# Patient Record
Sex: Female | Born: 1940
Health system: Southern US, Community
[De-identification: ages and names within clinical notes are randomized; demographics above are authoritative.]

## PROBLEM LIST (undated history)

## (undated) DIAGNOSIS — Z87898 Personal history of other specified conditions: Secondary | ICD-10-CM

## (undated) DIAGNOSIS — I6930 Unspecified sequelae of cerebral infarction: Secondary | ICD-10-CM

## (undated) DIAGNOSIS — R269 Unspecified abnormalities of gait and mobility: Secondary | ICD-10-CM

## (undated) DIAGNOSIS — I517 Cardiomegaly: Secondary | ICD-10-CM

## (undated) DIAGNOSIS — I2510 Atherosclerotic heart disease of native coronary artery without angina pectoris: Secondary | ICD-10-CM

## (undated) DIAGNOSIS — R262 Difficulty in walking, not elsewhere classified: Secondary | ICD-10-CM

## (undated) DIAGNOSIS — R7303 Prediabetes: Secondary | ICD-10-CM

## (undated) DIAGNOSIS — E785 Hyperlipidemia, unspecified: Secondary | ICD-10-CM

## (undated) DIAGNOSIS — I6523 Occlusion and stenosis of bilateral carotid arteries: Secondary | ICD-10-CM

## (undated) DIAGNOSIS — I471 Supraventricular tachycardia: Secondary | ICD-10-CM

## (undated) DIAGNOSIS — I10 Essential (primary) hypertension: Secondary | ICD-10-CM

## (undated) DIAGNOSIS — E039 Hypothyroidism, unspecified: Secondary | ICD-10-CM

## (undated) DIAGNOSIS — R2990 Unspecified symptoms and signs involving the nervous system: Secondary | ICD-10-CM

## (undated) DIAGNOSIS — F4322 Adjustment disorder with anxiety: Secondary | ICD-10-CM

## (undated) DIAGNOSIS — I340 Nonrheumatic mitral (valve) insufficiency: Secondary | ICD-10-CM

## (undated) DIAGNOSIS — R002 Palpitations: Secondary | ICD-10-CM

## (undated) DIAGNOSIS — I350 Nonrheumatic aortic (valve) stenosis: Secondary | ICD-10-CM

## (undated) DIAGNOSIS — G214 Vascular parkinsonism: Secondary | ICD-10-CM

## (undated) DIAGNOSIS — I639 Cerebral infarction, unspecified: Secondary | ICD-10-CM

## (undated) DIAGNOSIS — G8194 Hemiplegia, unspecified affecting left nondominant side: Secondary | ICD-10-CM

## (undated) DIAGNOSIS — Z9289 Personal history of other medical treatment: Secondary | ICD-10-CM

## (undated) HISTORY — DX: Adjustment disorder with anxiety: F43.22

## (undated) HISTORY — DX: Hyperlipidemia, unspecified: E78.5

## (undated) HISTORY — DX: Palpitations: R00.2

## (undated) HISTORY — DX: Prediabetes: R73.03

## (undated) HISTORY — DX: Unspecified symptoms and signs involving the nervous system: R29.90

## (undated) HISTORY — DX: Personal history of other medical treatment: Z92.89

## (undated) HISTORY — DX: Difficulty in walking, not elsewhere classified: R26.2

## (undated) HISTORY — DX: Cardiomegaly: I51.7

## (undated) HISTORY — DX: Unspecified sequelae of cerebral infarction: I69.30

## (undated) HISTORY — DX: Nonrheumatic aortic (valve) stenosis: I35.0

## (undated) HISTORY — DX: Hypothyroidism, unspecified: E03.9

## (undated) HISTORY — DX: Personal history of other specified conditions: Z87.898

## (undated) HISTORY — DX: Unspecified abnormalities of gait and mobility: R26.9

## (undated) HISTORY — DX: Nonrheumatic mitral (valve) insufficiency: I34.0

## (undated) HISTORY — DX: Hemiplegia, unspecified affecting left nondominant side: G81.94

## (undated) HISTORY — DX: Cerebral infarction, unspecified: I63.9

## (undated) HISTORY — PX: LOOP RECORDER IMPLANT: SHX5954

## (undated) HISTORY — DX: Atherosclerotic heart disease of native coronary artery without angina pectoris: I25.10

## (undated) HISTORY — DX: Essential (primary) hypertension: I10

## (undated) HISTORY — DX: Vascular parkinsonism: G21.4

## (undated) HISTORY — DX: Occlusion and stenosis of bilateral carotid arteries: I65.23

## (undated) HISTORY — DX: Supraventricular tachycardia: I47.1

---

## 1999-07-14 ENCOUNTER — Other Ambulatory Visit: Admission: RE | Admit: 1999-07-14 | Discharge: 1999-07-14 | Payer: Self-pay | Admitting: Gynecology

## 2000-08-03 HISTORY — PX: CARDIOVASCULAR STRESS TEST: SHX262

## 2000-08-17 ENCOUNTER — Other Ambulatory Visit: Admission: RE | Admit: 2000-08-17 | Discharge: 2000-08-17 | Payer: Self-pay | Admitting: Gynecology

## 2001-10-04 ENCOUNTER — Other Ambulatory Visit: Admission: RE | Admit: 2001-10-04 | Discharge: 2001-10-04 | Payer: Self-pay | Admitting: Gynecology

## 2002-10-24 ENCOUNTER — Other Ambulatory Visit: Admission: RE | Admit: 2002-10-24 | Discharge: 2002-10-24 | Payer: Self-pay | Admitting: Gynecology

## 2004-11-25 ENCOUNTER — Other Ambulatory Visit: Admission: RE | Admit: 2004-11-25 | Discharge: 2004-11-25 | Payer: Self-pay | Admitting: Gynecology

## 2006-08-03 HISTORY — PX: TRANSTHORACIC ECHOCARDIOGRAM: SHX275

## 2007-03-29 ENCOUNTER — Other Ambulatory Visit: Admission: RE | Admit: 2007-03-29 | Discharge: 2007-03-29 | Payer: Self-pay | Admitting: Gynecology

## 2008-05-02 ENCOUNTER — Encounter: Admission: RE | Admit: 2008-05-02 | Discharge: 2008-05-02 | Payer: Self-pay | Admitting: Internal Medicine

## 2008-10-01 ENCOUNTER — Encounter: Admission: RE | Admit: 2008-10-01 | Discharge: 2008-10-01 | Payer: Self-pay | Admitting: Endocrinology

## 2009-04-03 ENCOUNTER — Encounter: Admission: RE | Admit: 2009-04-03 | Discharge: 2009-04-03 | Payer: Self-pay | Admitting: Endocrinology

## 2010-04-11 ENCOUNTER — Encounter: Admission: RE | Admit: 2010-04-11 | Discharge: 2010-04-11 | Payer: Self-pay | Admitting: Endocrinology

## 2010-04-30 ENCOUNTER — Ambulatory Visit: Payer: Self-pay | Admitting: Cardiology

## 2010-05-06 ENCOUNTER — Ambulatory Visit: Payer: Self-pay | Admitting: Cardiology

## 2010-10-08 ENCOUNTER — Ambulatory Visit: Payer: Medicare Other | Attending: Internal Medicine | Admitting: Physical Therapy

## 2010-10-08 DIAGNOSIS — R42 Dizziness and giddiness: Secondary | ICD-10-CM | POA: Insufficient documentation

## 2010-10-08 DIAGNOSIS — IMO0001 Reserved for inherently not codable concepts without codable children: Secondary | ICD-10-CM | POA: Insufficient documentation

## 2010-10-13 ENCOUNTER — Ambulatory Visit: Payer: Medicare Other | Admitting: Rehabilitative and Restorative Service Providers"

## 2010-10-14 ENCOUNTER — Encounter: Payer: Medicare Other | Admitting: Rehabilitative and Restorative Service Providers"

## 2010-10-20 ENCOUNTER — Ambulatory Visit: Payer: Medicare Other | Admitting: Cardiology

## 2010-10-28 ENCOUNTER — Encounter: Payer: Medicare Other | Admitting: Physical Therapy

## 2010-10-30 ENCOUNTER — Encounter: Payer: Medicare Other | Admitting: Physical Therapy

## 2010-11-04 ENCOUNTER — Encounter: Payer: Medicare Other | Admitting: Physical Therapy

## 2010-11-06 ENCOUNTER — Encounter: Payer: Medicare Other | Admitting: Physical Therapy

## 2010-11-11 ENCOUNTER — Encounter: Payer: Medicare Other | Admitting: Physical Therapy

## 2010-11-13 ENCOUNTER — Encounter: Payer: Medicare Other | Admitting: Physical Therapy

## 2010-12-01 ENCOUNTER — Other Ambulatory Visit: Payer: Self-pay | Admitting: *Deleted

## 2010-12-01 DIAGNOSIS — Z79899 Other long term (current) drug therapy: Secondary | ICD-10-CM

## 2010-12-01 DIAGNOSIS — E7800 Pure hypercholesterolemia, unspecified: Secondary | ICD-10-CM

## 2010-12-15 ENCOUNTER — Other Ambulatory Visit: Payer: Medicare Other | Admitting: *Deleted

## 2010-12-17 ENCOUNTER — Telehealth: Payer: Self-pay | Admitting: Cardiology

## 2010-12-17 NOTE — Telephone Encounter (Signed)
Has been having dizziness due to ear/sinus problems.  Her ENT (Dr. Haroldine Laws) wanted pt to f/u on her  meds and possible bloodwork.

## 2010-12-17 NOTE — Telephone Encounter (Signed)
Offered appointment with Lawson Fiscal, wanted to see Dr. Patty Sermons gave her next available for 5/24

## 2010-12-18 ENCOUNTER — Ambulatory Visit: Payer: Medicare Other | Admitting: Cardiology

## 2010-12-23 ENCOUNTER — Telehealth: Payer: Self-pay | Admitting: Cardiology

## 2010-12-23 NOTE — Telephone Encounter (Signed)
Agree with plan 

## 2010-12-23 NOTE — Telephone Encounter (Signed)
blood pressure was up to 150/89 and has been off for 3 days.  Advised to take 1/2 tonite and none tomorrow and keep appointment with Dr Patty Sermons on Thursday.  She doesn't think she needs to continue.

## 2010-12-23 NOTE — Telephone Encounter (Signed)
Has an appointment on Thursday w/[facility]. She has been feeling dizzy the past several days and wants to know if she can stop taking her BP med.until she sees the Dr.  She wonders if her dizziness may be from something else.

## 2010-12-24 ENCOUNTER — Encounter: Payer: Self-pay | Admitting: Cardiology

## 2010-12-24 DIAGNOSIS — I350 Nonrheumatic aortic (valve) stenosis: Secondary | ICD-10-CM | POA: Insufficient documentation

## 2010-12-24 DIAGNOSIS — R262 Difficulty in walking, not elsewhere classified: Secondary | ICD-10-CM | POA: Insufficient documentation

## 2010-12-24 DIAGNOSIS — Z87898 Personal history of other specified conditions: Secondary | ICD-10-CM | POA: Insufficient documentation

## 2010-12-24 DIAGNOSIS — I10 Essential (primary) hypertension: Secondary | ICD-10-CM | POA: Insufficient documentation

## 2010-12-24 DIAGNOSIS — E7849 Other hyperlipidemia: Secondary | ICD-10-CM | POA: Insufficient documentation

## 2010-12-24 DIAGNOSIS — E039 Hypothyroidism, unspecified: Secondary | ICD-10-CM | POA: Insufficient documentation

## 2010-12-24 DIAGNOSIS — I517 Cardiomegaly: Secondary | ICD-10-CM | POA: Insufficient documentation

## 2010-12-24 DIAGNOSIS — R0602 Shortness of breath: Secondary | ICD-10-CM | POA: Insufficient documentation

## 2010-12-24 DIAGNOSIS — E559 Vitamin D deficiency, unspecified: Secondary | ICD-10-CM | POA: Insufficient documentation

## 2010-12-24 DIAGNOSIS — I340 Nonrheumatic mitral (valve) insufficiency: Secondary | ICD-10-CM | POA: Insufficient documentation

## 2010-12-24 DIAGNOSIS — R002 Palpitations: Secondary | ICD-10-CM | POA: Insufficient documentation

## 2010-12-25 ENCOUNTER — Encounter: Payer: Self-pay | Admitting: Cardiology

## 2010-12-25 ENCOUNTER — Ambulatory Visit (INDEPENDENT_AMBULATORY_CARE_PROVIDER_SITE_OTHER): Payer: Medicare Other | Admitting: Cardiology

## 2010-12-25 ENCOUNTER — Other Ambulatory Visit (INDEPENDENT_AMBULATORY_CARE_PROVIDER_SITE_OTHER): Payer: Medicare Other | Admitting: *Deleted

## 2010-12-25 ENCOUNTER — Other Ambulatory Visit (INDEPENDENT_AMBULATORY_CARE_PROVIDER_SITE_OTHER): Payer: Medicare Other | Admitting: Cardiology

## 2010-12-25 VITALS — BP 140/70 | HR 84 | Wt 118.0 lb

## 2010-12-25 DIAGNOSIS — E7800 Pure hypercholesterolemia, unspecified: Secondary | ICD-10-CM

## 2010-12-25 DIAGNOSIS — I10 Essential (primary) hypertension: Secondary | ICD-10-CM

## 2010-12-25 DIAGNOSIS — Z79899 Other long term (current) drug therapy: Secondary | ICD-10-CM

## 2010-12-25 DIAGNOSIS — I119 Hypertensive heart disease without heart failure: Secondary | ICD-10-CM

## 2010-12-25 DIAGNOSIS — Z13220 Encounter for screening for lipoid disorders: Secondary | ICD-10-CM

## 2010-12-25 DIAGNOSIS — Z87898 Personal history of other specified conditions: Secondary | ICD-10-CM

## 2010-12-25 LAB — BASIC METABOLIC PANEL
BUN: 12 mg/dL (ref 6–23)
Calcium: 9.7 mg/dL (ref 8.4–10.5)
GFR: 89.35 mL/min (ref >60.00)
Glucose, Bld: 106 mg/dL — ABNORMAL HIGH (ref 70–99)

## 2010-12-25 LAB — LIPID PANEL
Cholesterol: 305 mg/dL — ABNORMAL HIGH (ref 0–200)
HDL: 68.9 mg/dL (ref >39.00)
VLDL: 17.2 mg/dL (ref 0.0–40.0)

## 2010-12-25 LAB — HEPATIC FUNCTION PANEL: Albumin: 4.2 g/dL (ref 3.5–5.2)

## 2010-12-25 MED ORDER — TELMISARTAN 40 MG PO TABS: ORAL_TABLET | ORAL | Status: DC

## 2010-12-25 NOTE — Assessment & Plan Note (Signed)
The patient continues to have a lot of problems with dizziness.  She complains that her equilibrium is off.  She has seen Dr. Haroldine Laws who has evaluated her thoroughly.  He has ordered an MRI of her head which is scheduled for next week.  She was asking me if I thought that she should proceed with that test and I encouraged her to do so.

## 2010-12-25 NOTE — Assessment & Plan Note (Signed)
Patient continues to have evidence of very high cholesterol.  Her total cholesterol is 305 with an LDL of257.  She does not tolerate statin drugs.

## 2010-12-25 NOTE — Assessment & Plan Note (Signed)
The patient has a past history of labileHypertension.  Her blood pressure will be very high one minute and low the next.  She also has a history of frequent palpitations.  Unfortunately she does not tolerate beta blockers.She is presently taking one half of a 40 mg Micardis and on this dose her blood pressure has been in a acceptable range most of the time.  The patient is not having any chest pain to suggest angina.

## 2010-12-25 NOTE — Progress Notes (Signed)
Stacey Ramirez Date of Birth:  1941/01/01 [facility] / [facility] [address]   [address] [place], [place]  [address] [phone]           Fax   [phone]  History of Present Illness: This pleasant 70 year old is seen for a scheduled followup office visit.  She has a complex past medical history.  He has a history of labile hypertension and a history of significant hypercholesterolemia.  She also has a history of hypothyroidism and low vitamin D levels.  She had a stress test in 2000 which was normal.  Her last echocardiogram was on 02/15/2007 and showed left ventricular hypertrophy with mild aortic sclerosis and very mild mitral regurgitation she has a history of hypercholesterolemia but is intolerant of statins and intolerant of other cholesterol medications that we have tried.  She emphasizes that she is very sensitive to most medications.  Recently she has been worked up thoroughly by Dr. Haroldine Laws regarding her symptoms of dizziness.  She is scheduled to have an MRI of the head next week.  Current Outpatient Prescriptions  Medication Sig Dispense Refill  . ALPRAZolam (XANAX) 0.25 MG tablet Take 0.25 mg by mouth at bedtime as needed.        . Cholecalciferol (VITAMIN D PO) Take by mouth daily. Taking occ.      . estradiol (ESTRACE) 0.1 MG/GM vaginal cream Place 2 g vaginally as needed.        Marland Kitchen levothyroxine (SYNTHROID, LEVOTHROID) 50 MCG tablet Take 50 mcg by mouth daily.        . Multiple Vitamin (MULTIVITAMIN) tablet Take 1 tablet by mouth daily. Takes occ.      Marland Kitchen DISCONTD: telmisartan (MICARDIS) 20 MG tablet Take 10 mg by mouth daily.        Marland Kitchen telmisartan (MICARDIS) 40 MG tablet Take one half tablet daily  30 tablet  11  . DISCONTD: BIOTIN PO Take by mouth as directed.          Allergies  Allergen Reactions  . Micardis (Telmisartan)   . Sulfa Drugs Cross Reactors   . Zocor (Simvastatin)     Patient Active Problem List  Diagnoses  . Hypertension  .  Hypercholesterolemia  . Hypothyroidism  . Vitamin D deficiency  . Palpitations  . History of dizziness  . LVH (left ventricular hypertrophy)  . Aortic stenosis  . Mitral regurgitation  . SOB (shortness of breath)  . Difficulty walking    History  Smoking status  . Never Smoker   Smokeless tobacco  . Not on file    History  Alcohol Use No    Family History  Problem Relation Age of Onset  . Heart attack Mother   . Alzheimer's disease Mother     Review of Systems: Constitutional: no fever chills diaphoresis or fatigue or change in weight.  Head and neck: no hearing loss, no epistaxis, no photophobia or visual disturbance. Respiratory: No cough, shortness of breath or wheezing. Cardiovascular: No chest pain peripheral edema, palpitations. Gastrointestinal: No abdominal distention, no abdominal pain, no change in bowel habits hematochezia or melena. Genitourinary: No dysuria, no frequency, no urgency, no nocturia. Musculoskeletal:No arthralgias, no back pain, no gait disturbance or myalgias. Neurological: No dizziness, no headaches, no numbness, no seizures, no syncope, no weakness, no tremors. Hematologic: No lymphadenopathy, no easy bruising. Psychiatric: No confusion, no hallucinations, She has had a difficult time falling asleep and staying asleep.    Physical Exam: Filed Vitals:   12/25/10  1459  BP: 140/70  Pulse: 84  The general appearance is that of an anxious middle-aged woman in no acute distress.  She is not ataxic when she walks.Pupils equal and reactive.   Extraocular Movements are full.  There is no scleral icterus.  The mouth and pharynx are normal.  The neck is supple.  The carotids reveal no bruits.  The jugular venous pressure is normal.  The thyroid is not enlarged.  There is no lymphadenopathy.The chest is clear to percussion and auscultation. There are no rales or rhonchi. Expansion of the chest is symmetrical.  The heart reveals a soft systolic ejection  murmur at the base.  No diastolic murmur and no gallop.The abdomen is soft and nontender. Bowel sounds are normal. The liver and spleen are not enlarged. There Are no abdominal masses. There are no bruits.The pedal pulses are good.  There is no phlebitis or edema.  There is no cyanosis or clubbing.Strength is normal and symmetrical in all extremities.  There is no lateralizing weakness.  There are no sensory deficits.The skin is warm and dry.  There is no rash.   Assessment / Plan: Continue present medication.  Try to do the best she can with diet since she does not tolerate cholesterol-lowering medication.  Continue on salt restricted diet for her dizziness and her blood pressure.  Recheck in 6 months for followup office visit and lab work

## 2010-12-26 ENCOUNTER — Telehealth: Payer: Self-pay | Admitting: Cardiology

## 2010-12-26 NOTE — Telephone Encounter (Signed)
Concerned didn't receive all labs, went over and she did

## 2010-12-26 NOTE — Telephone Encounter (Signed)
Pt didn't get all lab results yesterday please call

## 2010-12-31 ENCOUNTER — Ambulatory Visit: Payer: Medicare Other | Admitting: Cardiology

## 2011-01-05 ENCOUNTER — Telehealth: Payer: Self-pay | Admitting: Cardiology

## 2011-01-05 NOTE — Telephone Encounter (Signed)
Has a question about the MRI that was done.  She also wanted to know what she should do about her lower than normal BP readings.

## 2011-01-06 ENCOUNTER — Telehealth: Payer: Self-pay | Admitting: Cardiology

## 2011-01-06 DIAGNOSIS — I10 Essential (primary) hypertension: Secondary | ICD-10-CM

## 2011-01-06 MED ORDER — TELMISARTAN 20 MG PO TABS: ORAL_TABLET | ORAL | Status: DC

## 2011-01-06 NOTE — Telephone Encounter (Signed)
Spoke with patient today regarding low blood pressure at times will discuss with Dr. Patty Sermons.  Will document on 6/5 telephone encounter

## 2011-01-06 NOTE — Telephone Encounter (Signed)
Pt calling back again was concerned about MRI and the meds making her woozy she said please call

## 2011-01-06 NOTE — Telephone Encounter (Signed)
Patient has been taking micardis 40 mg 1/2 daily. States when her blood pressure goes low she decreases to 1/4 of tablet and seems to be enough.  Will change her micardis to 20 mg 1 daily or 1/2 if blood pressure low.  Continues to be "whoozy" and wanted to know if thyroid meds could be causing, advised it should not be.  Is seeing ENT for dizziness.

## 2011-01-07 NOTE — Telephone Encounter (Signed)
Agree with advice given

## 2011-03-05 ENCOUNTER — Encounter: Payer: Self-pay | Admitting: Cardiology

## 2011-04-13 ENCOUNTER — Other Ambulatory Visit: Payer: Self-pay | Admitting: Endocrinology

## 2011-04-13 DIAGNOSIS — E049 Nontoxic goiter, unspecified: Secondary | ICD-10-CM

## 2011-04-14 ENCOUNTER — Other Ambulatory Visit: Payer: Self-pay | Admitting: Endocrinology

## 2011-04-14 DIAGNOSIS — E049 Nontoxic goiter, unspecified: Secondary | ICD-10-CM

## 2011-06-03 ENCOUNTER — Telehealth: Payer: Self-pay | Admitting: Cardiology

## 2011-06-03 DIAGNOSIS — I119 Hypertensive heart disease without heart failure: Secondary | ICD-10-CM

## 2011-06-03 DIAGNOSIS — E039 Hypothyroidism, unspecified: Secondary | ICD-10-CM

## 2011-06-03 NOTE — Telephone Encounter (Signed)
Pt wants to go over some things prior to getting lab work done tomorrow wants to talk to you today

## 2011-06-03 NOTE — Telephone Encounter (Signed)
Called and put in lab orders.

## 2011-06-04 ENCOUNTER — Ambulatory Visit (INDEPENDENT_AMBULATORY_CARE_PROVIDER_SITE_OTHER): Payer: Medicare Other | Admitting: *Deleted

## 2011-06-04 DIAGNOSIS — I10 Essential (primary) hypertension: Secondary | ICD-10-CM

## 2011-06-04 DIAGNOSIS — I517 Cardiomegaly: Secondary | ICD-10-CM

## 2011-06-04 DIAGNOSIS — E039 Hypothyroidism, unspecified: Secondary | ICD-10-CM

## 2011-06-04 DIAGNOSIS — E7800 Pure hypercholesterolemia, unspecified: Secondary | ICD-10-CM

## 2011-06-04 DIAGNOSIS — I119 Hypertensive heart disease without heart failure: Secondary | ICD-10-CM

## 2011-06-04 DIAGNOSIS — E559 Vitamin D deficiency, unspecified: Secondary | ICD-10-CM

## 2011-06-04 LAB — BASIC METABOLIC PANEL
BUN: 10 mg/dL (ref 6–23)
CO2: 27 mEq/L (ref 19–32)
Chloride: 106 mEq/L (ref 96–112)
Creatinine, Ser: 0.7 mg/dL (ref 0.4–1.2)

## 2011-06-04 LAB — LIPID PANEL
Total CHOL/HDL Ratio: 4
VLDL: 13.6 mg/dL (ref 0.0–40.0)

## 2011-06-04 LAB — HEPATIC FUNCTION PANEL
Albumin: 4.3 g/dL (ref 3.5–5.2)
Alkaline Phosphatase: 58 U/L (ref 39–117)
Bilirubin, Direct: 0.1 mg/dL (ref 0.0–0.3)

## 2011-06-05 LAB — VITAMIN D 25 HYDROXY (VIT D DEFICIENCY, FRACTURES): Vit D, 25-Hydroxy: 39 ng/mL (ref 30–89)

## 2011-06-08 ENCOUNTER — Ambulatory Visit (INDEPENDENT_AMBULATORY_CARE_PROVIDER_SITE_OTHER): Payer: Medicare Other | Admitting: Cardiology

## 2011-06-08 ENCOUNTER — Encounter: Payer: Self-pay | Admitting: Cardiology

## 2011-06-08 VITALS — BP 120/78 | HR 80 | Ht 62.0 in | Wt 119.0 lb

## 2011-06-08 DIAGNOSIS — E7800 Pure hypercholesterolemia, unspecified: Secondary | ICD-10-CM

## 2011-06-08 DIAGNOSIS — I10 Essential (primary) hypertension: Secondary | ICD-10-CM

## 2011-06-08 DIAGNOSIS — R002 Palpitations: Secondary | ICD-10-CM

## 2011-06-08 DIAGNOSIS — R42 Dizziness and giddiness: Secondary | ICD-10-CM

## 2011-06-08 DIAGNOSIS — E039 Hypothyroidism, unspecified: Secondary | ICD-10-CM

## 2011-06-08 DIAGNOSIS — I119 Hypertensive heart disease without heart failure: Secondary | ICD-10-CM

## 2011-06-08 NOTE — Patient Instructions (Addendum)
Your physician wants you to follow-up in: 6 months with fasting labs ahead of time You will receive a reminder letter in the mail two months in advance. If you don't receive a letter, please call our office to schedule the follow-up appointment.  Your physician recommends that you continue on your current medications as directed. Please refer to the Current Medication list given to you today.

## 2011-06-08 NOTE — Progress Notes (Signed)
Stacey Ramirez Date of Birth:  Jul 02, 1941 [facility] / [facility] [address]   [address] [place], [place]  [address] [phone]           Fax   [phone]  History of Present Illness: This pleasant elderly woman is seen for a scheduled followup office visit.  Has a past history of high blood pressure and hypercholesterolemia.  She also has a history of hypothyroidism.  She does not have any history of ischemic heart disease.  She had a stress test in 2000 which was normal.  Her last echocardiogram in July 2008 showed LVH with mild aortic sclerosis and very mild mitral regurgitation.  The patient has a history of significant hypercholesterolemia but is intolerant of statin is having problems with dizziness and had an MRI of her head and May 2012 which showed no evidence of a acoustic neuroma.  Current Outpatient Prescriptions  Medication Sig Dispense Refill  . ALPRAZolam (XANAX) 0.25 MG tablet Take 0.25 mg by mouth at bedtime as needed.        . Cholecalciferol (VITAMIN D PO) Take by mouth daily. Taking occ.      . estradiol (ESTRACE) 0.1 MG/GM vaginal cream Place 2 g vaginally as needed.        Marland Kitchen levothyroxine (SYNTHROID, LEVOTHROID) 50 MCG tablet Take 50 mcg by mouth daily.        . Multiple Vitamin (MULTIVITAMIN) tablet Take 1 tablet by mouth daily. Takes occ.      . telmisartan (MICARDIS) 20 MG tablet Taking 1/2 daily       . trimethoprim (TRIMPEX) 100 MG tablet Take 100 mg by mouth as directed.      Marland Kitchen DISCONTD: telmisartan (MICARDIS) 20 MG tablet Daily or as directed  90 tablet  3    Allergies  Allergen Reactions  . Micardis (Telmisartan)   . Sulfa Drugs Cross Reactors   . Zocor (Simvastatin)     Patient Active Problem List  Diagnoses  . Hypertension  . Hypercholesterolemia  . Hypothyroidism  . Vitamin D deficiency  . Palpitations  . History of dizziness  . LVH (left ventricular hypertrophy)  . Aortic stenosis  . Mitral regurgitation  . SOB (shortness  of breath)  . Difficulty walking    History  Smoking status  . Never Smoker   Smokeless tobacco  . Not on file    History  Alcohol Use No    Family History  Problem Relation Age of Onset  . Heart attack Mother   . Alzheimer's disease Mother     Review of Systems: Constitutional: no fever chills diaphoresis or fatigue or change in weight.  Head and neck: no hearing loss, no epistaxis, no photophobia or visual disturbance. Respiratory: No cough, shortness of breath or wheezing. Cardiovascular: No chest pain peripheral edema, palpitations. Gastrointestinal: No abdominal distention, no abdominal pain, no change in bowel habits hematochezia or melena. Genitourinary: No dysuria, no frequency, no urgency, no nocturia. Musculoskeletal:No arthralgias, no back pain, no gait disturbance or myalgias. Neurological: No dizziness, no headaches, no numbness, no seizures, no syncope, no weakness, no tremors. Hematologic: No lymphadenopathy, no easy bruising. Psychiatric: No confusion, no hallucinations, no sleep disturbance.    Physical Exam: Filed Vitals:   06/08/11 1144  BP: 120/78  Pulse: 80   Gen. appearance reveals a well-developed anxious woman in no acute distress.Pupils equal and reactive.   Extraocular Movements are full.  There is no scleral icterus.  The mouth and pharynx are normal.  The neck is supple.  The carotids reveal no bruits.  The jugular venous pressure is normal.  The thyroid is not enlarged.  There is no lymphadenopathy.  The chest is clear to percussion and auscultation. There are no rales or rhonchi. Expansion of the chest is symmetrical.  The heart reveals a soft systolic ejection murmur at the base.  No gallop click or rub.  The abdomen is soft and nontender. Bowel sounds are normal. The liver and spleen are not enlarged. There Are no abdominal masses. There are no bruits.  The pedal pulses are good.  There is no phlebitis or edema.  There is no cyanosis or  clubbing. Strength is normal and symmetrical in all extremities.  There is no lateralizing weakness.  There are no sensory deficits.  The skin is warm and dry.  There is no rash.   Assessment / Plan: Return in March for a followup office visit.  She will get fasting lab work ahead of time.

## 2011-06-08 NOTE — Assessment & Plan Note (Signed)
Patient has a history of labile hypertension.  Today in the office her blood pressure is good she does have occasional dizzy spells associated with fluctuation of her blood pressure.  She has had a full workup by Dr. Haroldine Laws with no significant underlying pathology found.

## 2011-06-08 NOTE — Assessment & Plan Note (Signed)
The patient has a history of significant hypercholesterolemia.  She is unable to take statins.  Her lipids this time have improved since last time.  She is taking some over-the-counter remedies.

## 2011-06-08 NOTE — Assessment & Plan Note (Signed)
The patient has a long history of palpitations.  He has not been experiencing any documented atrial fibrillation.  She does not have any history of ischemic heart disease and she did have a normal nuclear stress test in 2002.

## 2011-10-26 ENCOUNTER — Telehealth: Payer: Self-pay | Admitting: Cardiology

## 2011-10-26 NOTE — Telephone Encounter (Signed)
Discussed upcoming labs with patient

## 2011-10-26 NOTE — Telephone Encounter (Signed)
Please return call to patient at hm#  [phone], she has questions about labs

## 2011-12-10 ENCOUNTER — Other Ambulatory Visit: Payer: Medicare Other

## 2011-12-14 ENCOUNTER — Ambulatory Visit (INDEPENDENT_AMBULATORY_CARE_PROVIDER_SITE_OTHER): Payer: Medicare Other | Admitting: *Deleted

## 2011-12-14 DIAGNOSIS — E7800 Pure hypercholesterolemia, unspecified: Secondary | ICD-10-CM

## 2011-12-14 DIAGNOSIS — E079 Disorder of thyroid, unspecified: Secondary | ICD-10-CM

## 2011-12-14 LAB — LIPID PANEL
Cholesterol: 298 mg/dL — ABNORMAL HIGH (ref 0–200)
HDL: 67.7 mg/dL (ref >39.00)
Triglycerides: 70 mg/dL (ref 0.0–149.0)
VLDL: 14 mg/dL (ref 0.0–40.0)

## 2011-12-14 LAB — BASIC METABOLIC PANEL
CO2: 26 mEq/L (ref 19–32)
Calcium: 9 mg/dL (ref 8.4–10.5)
Creatinine, Ser: 0.6 mg/dL (ref 0.4–1.2)
GFR: 97.18 mL/min (ref >60.00)
Glucose, Bld: 92 mg/dL (ref 70–99)
Sodium: 140 mEq/L (ref 135–145)

## 2011-12-14 LAB — HEPATIC FUNCTION PANEL
Albumin: 4.3 g/dL (ref 3.5–5.2)
Total Protein: 7.9 g/dL (ref 6.0–8.3)

## 2011-12-14 NOTE — Progress Notes (Signed)
Quick Note:  Please make copy of labs for patient visit. ______ 

## 2011-12-15 ENCOUNTER — Ambulatory Visit (INDEPENDENT_AMBULATORY_CARE_PROVIDER_SITE_OTHER): Payer: Medicare Other | Admitting: Cardiology

## 2011-12-15 ENCOUNTER — Encounter: Payer: Self-pay | Admitting: Cardiology

## 2011-12-15 VITALS — BP 160/82 | HR 80 | Ht 62.0 in | Wt 119.0 lb

## 2011-12-15 DIAGNOSIS — K649 Unspecified hemorrhoids: Secondary | ICD-10-CM

## 2011-12-15 DIAGNOSIS — R002 Palpitations: Secondary | ICD-10-CM

## 2011-12-15 DIAGNOSIS — I10 Essential (primary) hypertension: Secondary | ICD-10-CM

## 2011-12-15 DIAGNOSIS — E7800 Pure hypercholesterolemia, unspecified: Secondary | ICD-10-CM

## 2011-12-15 DIAGNOSIS — I119 Hypertensive heart disease without heart failure: Secondary | ICD-10-CM

## 2011-12-15 MED ORDER — DILTIAZEM GEL 2 %: 1.0000 "application " | Freq: Two times a day (BID) | CUTANEOUS | Status: DC

## 2011-12-15 MED ORDER — LOSARTAN POTASSIUM 50 MG PO TABS: 50.0000 mg | ORAL_TABLET | Freq: Every day | ORAL | Status: DC

## 2011-12-15 NOTE — Patient Instructions (Signed)
Stop Micardis and start Losartan 50 mg daily, Rx sent to [facility] in [place]  Your physician wants you to follow-up in: 6 months You will receive a reminder letter in the mail two months in advance. If you don't receive a letter, please call our office to schedule the follow-up appointment.

## 2011-12-15 NOTE — Assessment & Plan Note (Signed)
Patient has a past history of tachycardia palpitations exacerbated by emotional stress.  We also made note that her serum potassium this time is borderline low and she will try to increase high potassium foods and this may help her palpitations as well

## 2011-12-15 NOTE — Assessment & Plan Note (Signed)
Her blood pressure today is very labile.  She does have extreme whitecoat syndrome.  Her initial blood pressure was greater than 200.  Subsequently in the exam with relaxation systolic comes down to 160.  She states that at home her systolic pressures are in the normal range.  The patient has been on Micardis but it has become expelled 4 and we will switch her to losartan 50 mg one daily for reasons of economics.

## 2011-12-15 NOTE — Assessment & Plan Note (Signed)
The patient has hypercholesterolemia.  She is intolerant of statins her serum cholesterol remains high and she will continue to try to control with diet and exercise.

## 2011-12-15 NOTE — Progress Notes (Signed)
Stacey Ramirez Date of Birth:  10-10-1940 [facility] [address] [place], [place]  [address] [phone]         Fax   [phone]  History of Present Illness: This pleasant 71 year old woman is seen for a scheduled followup office visit.  She has a past history of hyperkinetic heart syndrome with palpitations and labile hypertension.  She also has a history of hypercholesterolemia and a history of hypothyroidism.  She also has a history of GI difficulties with hemorrhoids.  Overall since last visit she has been doing well  Current Outpatient Prescriptions  Medication Sig Dispense Refill  . ALPRAZolam (XANAX) 0.25 MG tablet Take 0.25 mg by mouth at bedtime as needed.        . Cholecalciferol (VITAMIN D PO) Take by mouth daily. Taking occ.      . estradiol (ESTRACE) 0.1 MG/GM vaginal cream Place 2 g vaginally as needed.        Marland Kitchen levothyroxine (SYNTHROID, LEVOTHROID) 50 MCG tablet Take 50 mcg by mouth daily.        . Multiple Vitamin (MULTIVITAMIN) tablet Take 1 tablet by mouth daily. Takes occ.      . trimethoprim (TRIMPEX) 100 MG tablet Take 100 mg by mouth as directed.      Marland Kitchen losartan (COZAAR) 50 MG tablet Take 1 tablet (50 mg total) by mouth daily.  90 tablet  3    Allergies  Allergen Reactions  . Micardis (Telmisartan)   . Sulfa Drugs Cross Reactors   . Zocor (Simvastatin)     Patient Active Problem List  Diagnoses  . Hypertension  . Hypercholesterolemia  . Hypothyroidism  . Vitamin D deficiency  . Palpitations  . History of dizziness  . LVH (left ventricular hypertrophy)  . Aortic stenosis  . Mitral regurgitation  . SOB (shortness of breath)  . Difficulty walking    History  Smoking status  . Never Smoker   Smokeless tobacco  . Not on file    History  Alcohol Use No    Family History  Problem Relation Age of Onset  . Heart attack Mother   . Alzheimer's disease Mother     Review of Systems: Constitutional: no fever chills  diaphoresis or fatigue or change in weight.  Head and neck: no hearing loss, no epistaxis, no photophobia or visual disturbance. Respiratory: No cough, shortness of breath or wheezing. Cardiovascular: No chest pain peripheral edema, palpitations. Gastrointestinal: No abdominal distention, no abdominal pain, no change in bowel habits hematochezia or melena. Genitourinary: No dysuria, no frequency, no urgency, no nocturia. Musculoskeletal:No arthralgias, no back pain, no gait disturbance or myalgias. Neurological: No dizziness, no headaches, no numbness, no seizures, no syncope, no weakness, no tremors. Hematologic: No lymphadenopathy, no easy bruising. Psychiatric: No confusion, no hallucinations, no sleep disturbance.    Physical Exam: Filed Vitals:   12/15/11 1038  BP: 160/82  Pulse: 80   the general appearance reveals a well-developed well-nourished anxious talkative woman in no distress.The head and neck exam reveals pupils equal and reactive.  Extraocular movements are full.  There is no scleral icterus.  The mouth and pharynx are normal.  The neck is supple.  The carotids reveal no bruits.  The jugular venous pressure is normal.  The  thyroid is not enlarged.  There is no lymphadenopathy.  The chest is clear to percussion and auscultation.  There are no rales or rhonchi.  Expansion of the chest is symmetrical.  The precordium is quiet.  The first heart sound is normal.  The second heart sound is physiologically split.  There is no  gallop rub or click.  There is a soft systolic ejection murmur at the base There is no abnormal lift or heave.  The abdomen is soft and nontender.  The bowel sounds are normal.  The liver and spleen are not enlarged.  There are no abdominal masses.  There are no abdominal bruits.  Extremities reveal good pedal pulses.  There is no phlebitis or edema.  There is no cyanosis or clubbing.  Strength is normal and symmetrical in all extremities.  There is no lateralizing  weakness.  There are no sensory deficits.  The skin is warm and dry.  There is no rash.  EKG today shows normal sinus rhythm at 75 per minute and no premature beats and is within normal limits  Assessment / Plan: Continue same medication.  We are switching her to losartan and stopping Micardis.  We also called her in some diltiazem gel for her hemorrhoids at her request.  Recheck in 6 months for followup office visit lipid panel hepatic function panel and basal metabolic panel.  [place] will be [place] this [place]

## 2011-12-16 ENCOUNTER — Other Ambulatory Visit: Payer: Self-pay | Admitting: Cardiology

## 2011-12-16 MED ORDER — ALPRAZOLAM 0.25 MG PO TABS: 0.2500 mg | ORAL_TABLET | Freq: Every evening | ORAL | Status: DC | PRN

## 2011-12-16 NOTE — Telephone Encounter (Signed)
Spoke to patient and needs her Xanax called in

## 2011-12-16 NOTE — Telephone Encounter (Signed)
Patient request return call regarding medical questions, she can be reached at [phone]

## 2012-02-26 ENCOUNTER — Other Ambulatory Visit: Payer: Self-pay

## 2012-02-26 MED ORDER — TELMISARTAN 20 MG PO TABS: 20.0000 mg | ORAL_TABLET | Freq: Every day | ORAL | Status: DC

## 2012-02-26 NOTE — Telephone Encounter (Signed)
Refilled micardis.

## 2012-04-11 ENCOUNTER — Ambulatory Visit
Admission: RE | Admit: 2012-04-11 | Discharge: 2012-04-11 | Disposition: A | Discharge status: Home / Self Care | Payer: Medicare Other | Source: Ambulatory Visit | Attending: Endocrinology | Admitting: Endocrinology

## 2012-04-11 DIAGNOSIS — E049 Nontoxic goiter, unspecified: Secondary | ICD-10-CM

## 2012-06-23 ENCOUNTER — Encounter: Payer: Self-pay | Admitting: Cardiology

## 2012-06-27 ENCOUNTER — Telehealth: Payer: Self-pay | Admitting: Cardiology

## 2012-06-27 NOTE — Telephone Encounter (Signed)
Agree with plan 

## 2012-06-27 NOTE — Telephone Encounter (Signed)
Left message to call back  

## 2012-06-27 NOTE — Telephone Encounter (Signed)
Jury number [id number]  [place] 10   Ok to do jury letter, will forward to  Dr. Patty Sermons

## 2012-06-27 NOTE — Telephone Encounter (Signed)
plz return call to pt [phone] regarding jury duty summons

## 2012-06-28 ENCOUNTER — Encounter: Payer: Self-pay | Admitting: *Deleted

## 2012-06-28 NOTE — Telephone Encounter (Signed)
Letter done and will be up front for pick up

## 2012-08-18 ENCOUNTER — Telehealth: Payer: Self-pay | Admitting: Cardiology

## 2012-08-18 NOTE — Telephone Encounter (Signed)
Pt called to report frequent fainting spells.  Tried to schedule pt to see Dr. Patty Sermons or Norma Fredrickson.  Pt refused.  Said she would just try an RX for Losartan from Dr. Patty Sermons from a long time ago instead of her Micardis.  Pt seemed to be talking in circles and ended up asking for a phone call from [facility].

## 2012-08-18 NOTE — Telephone Encounter (Signed)
New problem:   Would like a call back today .    C/o fainting spell comes on time to time. Can medication be look at.

## 2012-08-19 NOTE — Telephone Encounter (Signed)
Feels like she almost blacks out and has been doing this for years.  Very anxious and concerned about her medications. Scheduled appointment for patient, unable to come next week since she will be out of town.

## 2012-08-19 NOTE — Telephone Encounter (Signed)
Left message to call back  

## 2012-08-19 NOTE — Telephone Encounter (Signed)
Pt rtn call to [employer]

## 2012-09-01 ENCOUNTER — Ambulatory Visit: Payer: Medicare Other | Admitting: Cardiology

## 2012-09-06 ENCOUNTER — Ambulatory Visit (INDEPENDENT_AMBULATORY_CARE_PROVIDER_SITE_OTHER): Payer: Medicare Other | Admitting: Cardiology

## 2012-09-06 ENCOUNTER — Encounter: Payer: Self-pay | Admitting: Cardiology

## 2012-09-06 VITALS — BP 144/80 | HR 82 | Resp 18 | Ht 61.0 in | Wt 122.0 lb

## 2012-09-06 DIAGNOSIS — R002 Palpitations: Secondary | ICD-10-CM | POA: Insufficient documentation

## 2012-09-06 DIAGNOSIS — E7800 Pure hypercholesterolemia, unspecified: Secondary | ICD-10-CM

## 2012-09-06 DIAGNOSIS — I350 Nonrheumatic aortic (valve) stenosis: Secondary | ICD-10-CM

## 2012-09-06 DIAGNOSIS — I10 Essential (primary) hypertension: Secondary | ICD-10-CM

## 2012-09-06 DIAGNOSIS — I359 Nonrheumatic aortic valve disorder, unspecified: Secondary | ICD-10-CM

## 2012-09-06 HISTORY — DX: Palpitations: R00.2

## 2012-09-06 NOTE — Assessment & Plan Note (Signed)
The patient has a systolic ejection murmur at the base and also at the base of the neck on the right side.  We will update her echocardiogram.

## 2012-09-06 NOTE — Assessment & Plan Note (Addendum)
The patient has a past history of significant hypercholesterolemia but unfortunately has been intolerant of all 4 medications including statins.  She is trying to do her best with careful diet

## 2012-09-06 NOTE — Progress Notes (Signed)
Stacey Ramirez Date of Birth:  1941-07-25 [facility] [address] [place], [place]  [address] [phone]         Fax   [phone]  History of Present Illness: This pleasant 72 year old woman is seen for a work in followup office visit. She has a past history of hyperkinetic heart syndrome with palpitations and labile hypertension. She also has a history of hypercholesterolemia and a history of hypothyroidism. She also has a history of GI difficulties with hemorrhoids.  Recently she has been having episodes where she feels like she is about to black out.  On one occasion she checked her blood pressure was quite low and she held her micardis and her symptoms appeared to have improved.  Her blood pressure continues to be very labile.  She has gone back on Micardis recently and has had no further blackouts but she is concerned that they may occur again.  In the past she has had similar concerns and on her previous visit we had talked about switching to a different ARB in a very low dose.  This would be losartan 50 mg initially taking just half a tablet a day.   Current Outpatient Prescriptions  Medication Sig Dispense Refill  . ALPRAZolam (XANAX) 0.25 MG tablet Take 1 tablet (0.25 mg total) by mouth at bedtime as needed.  30 tablet  3  . Cholecalciferol (VITAMIN D PO) Take by mouth daily. Taking occ.      . diltiazem 2 % GEL Apply 1 application topically 2 (two) times daily.  30 g  3  . estradiol (ESTRACE) 0.1 MG/GM vaginal cream Place 2 g vaginally as needed.        Marland Kitchen levothyroxine (SYNTHROID, LEVOTHROID) 50 MCG tablet Take 50 mcg by mouth daily.        Marland Kitchen losartan (COZAAR) 50 MG tablet Take 50 mg by mouth as directed. 1/2 TABLET A DAY      . Multiple Vitamin (MULTIVITAMIN) tablet Take 1 tablet by mouth daily. Takes occ.      . trimethoprim (TRIMPEX) 100 MG tablet Take 100 mg by mouth as directed.        Allergies  Allergen Reactions  . Micardis (Telmisartan)   .  Sulfa Drugs Cross Reactors   . Zocor (Simvastatin)     Patient Active Problem List  Diagnosis  . Hypertension  . Hypercholesterolemia  . Hypothyroidism  . Vitamin D deficiency  . Palpitations  . History of dizziness  . LVH (left ventricular hypertrophy)  . Aortic stenosis  . Mitral regurgitation  . SOB (shortness of breath)  . Difficulty walking  . Heart palpitations    History  Smoking status  . Never Smoker   Smokeless tobacco  . Not on file    History  Alcohol Use No    Family History  Problem Relation Age of Onset  . Heart attack Mother   . Alzheimer's disease Mother     Review of Systems: Constitutional: no fever chills diaphoresis or fatigue or change in weight.  Head and neck: no hearing loss, no epistaxis, no photophobia or visual disturbance. Respiratory: No cough, shortness of breath or wheezing. Cardiovascular: No chest pain peripheral edema, palpitations. Gastrointestinal: No abdominal distention, no abdominal pain, no change in bowel habits hematochezia or melena. Genitourinary: No dysuria, no frequency, no urgency, no nocturia. Musculoskeletal:No arthralgias, no back pain, no gait disturbance or myalgias. Neurological: No dizziness, no headaches, no numbness, no seizures, no syncope, no weakness, no tremors.  Hematologic: No lymphadenopathy, no easy bruising. Psychiatric: No confusion, no hallucinations, no sleep disturbance.    Physical Exam: Filed Vitals:   09/06/12 1438  BP: 144/80  Pulse: 82  Resp: 18   the general appearance reveals a well-developed well-nourished active and talkative middle-aged woman in no distress.The head and neck exam reveals pupils equal and reactive.  Extraocular movements are full.  There is no scleral icterus.  The mouth and pharynx are normal.  The neck is supple.  The carotids reveal no bruits.  There is a systolic bruit at the area of the base of the right neck possibly arising in the area of the innominate  artery. The jugular venous pressure is normal.  The  thyroid is not enlarged.  There is no lymphadenopathy.  The chest is clear to percussion and auscultation.  There are no rales or rhonchi.  Expansion of the chest is symmetrical.  The precordium is quiet.  Occasional PACs are noted. The first heart sound is normal.  The second heart sound is physiologically split.  There is no  gallop rub or click.  There is a grade 2/6 systolic ejection murmur at the left sternal edge and aortic area.  No diastolic murmur.  There is no abnormal lift or heave.  The abdomen is soft and nontender.  The bowel sounds are normal.  The liver and spleen are not enlarged.  There are no abdominal masses.  There are no abdominal bruits.  Extremities reveal good pedal pulses.  There is no phlebitis or edema.  There is no cyanosis or clubbing.  Strength is normal and symmetrical in all extremities.  There is no lateralizing weakness.  There are no sensory deficits.  The skin is warm and dry.  There is no rash.    Assessment / Plan: Continue same medication except as stopped Micardis and start losartan 50 mg tablets initially taking just one half tablet daily and may increase to a full tablet daily depending on blood pressure response. Recheck in the near future for a two-dimensional echocardiogram. Recheck in 4 months for followup office visit and EKG.

## 2012-09-06 NOTE — Assessment & Plan Note (Signed)
Blood pressure remains extremely labile.  We will switch her to losartan 50 mg tablets taking one half tablet daily and observe response

## 2012-09-06 NOTE — Patient Instructions (Signed)
STOP MICARDIS AND START LOSARTAN 50 MG 1/2 TABLET A DAY  Your physician has requested that you have an echocardiogram. Echocardiography is a painless test that uses sound waves to create images of your heart. It provides your doctor with information about the size and shape of your heart and how well your heart's chambers and valves are working. This procedure takes approximately one hour. There are no restrictions for this procedure.  Your physician recommends that you schedule a follow-up appointment in: 4 month ov/ekg

## 2012-09-08 ENCOUNTER — Ambulatory Visit (HOSPITAL_COMMUNITY): Payer: Medicare Other | Attending: Cardiology | Admitting: Radiology

## 2012-09-08 DIAGNOSIS — R42 Dizziness and giddiness: Secondary | ICD-10-CM | POA: Insufficient documentation

## 2012-09-08 DIAGNOSIS — E7800 Pure hypercholesterolemia, unspecified: Secondary | ICD-10-CM | POA: Insufficient documentation

## 2012-09-08 DIAGNOSIS — I369 Nonrheumatic tricuspid valve disorder, unspecified: Secondary | ICD-10-CM

## 2012-09-08 DIAGNOSIS — R002 Palpitations: Secondary | ICD-10-CM | POA: Insufficient documentation

## 2012-09-08 DIAGNOSIS — I10 Essential (primary) hypertension: Secondary | ICD-10-CM | POA: Insufficient documentation

## 2012-09-08 DIAGNOSIS — R011 Cardiac murmur, unspecified: Secondary | ICD-10-CM | POA: Insufficient documentation

## 2012-09-08 NOTE — Progress Notes (Signed)
Echocardiogram performed.  

## 2012-09-09 ENCOUNTER — Telehealth: Payer: Self-pay | Admitting: Cardiology

## 2012-09-09 NOTE — Telephone Encounter (Signed)
New Problem    Pt had some questions she would like to ask you regarding a new medication and results of her echo.

## 2012-09-09 NOTE — Telephone Encounter (Signed)
Message copied by Burnell Blanks on Fri Sep 09, 2012  5:37 PM ------      Message from: Cassell Clement      Created: Thu Sep 08, 2012  9:34 PM       Echo normal, please report.  CSD

## 2012-09-09 NOTE — Telephone Encounter (Signed)
Advised patient

## 2013-01-05 ENCOUNTER — Other Ambulatory Visit: Payer: Medicare Other

## 2013-01-06 ENCOUNTER — Telehealth: Payer: Self-pay | Admitting: Cardiology

## 2013-01-06 NOTE — Telephone Encounter (Signed)
New Prob    Called in returning call from a few minutes ago. Please call.

## 2013-01-06 NOTE — Telephone Encounter (Signed)
Patient called regarding Losartan filled at [facility]. They have changed manufacturers and she didn't know if ok to take medication. Advised ok to try

## 2013-01-06 NOTE — Telephone Encounter (Signed)
Left message to call back  

## 2013-01-06 NOTE — Telephone Encounter (Signed)
New Problem  Pt has a question about her prescription. She said that one of the pills color has changed and she just wants to make sure its ok for her to take it.

## 2013-01-10 ENCOUNTER — Ambulatory Visit: Payer: Medicare Other | Admitting: Cardiology

## 2013-01-25 ENCOUNTER — Telehealth: Payer: Self-pay | Admitting: Cardiology

## 2013-01-25 NOTE — Telephone Encounter (Signed)
Spoke with patient and she is concerned that the Losartan is causing her to have some problems with swelling in her feet [place] and watering eyes. Patient would like to go back on her Micardis 20 mg tablet 1/2 daily. Patient had originally changed secondary to cost.  Advised ok to change and will call if she has any problems.

## 2013-01-25 NOTE — Telephone Encounter (Signed)
Will forward to  Dr. Brackbill  

## 2013-01-25 NOTE — Telephone Encounter (Signed)
New problem  Pt wants to speak with you regarding changing a medication

## 2013-01-29 NOTE — Telephone Encounter (Signed)
Agree with switch back to Micardis.

## 2013-04-10 ENCOUNTER — Other Ambulatory Visit: Payer: Self-pay | Admitting: Cardiology

## 2013-04-11 ENCOUNTER — Telehealth: Payer: Self-pay | Admitting: *Deleted

## 2013-04-11 NOTE — Telephone Encounter (Signed)
Patient phoned c/o dizziness and weakness in her legs, one worse than the other. This has been going on for several days. Advised to call PCP per  Dr. Patty Sermons, patient verbalized understanding.

## 2013-04-13 ENCOUNTER — Encounter: Payer: Self-pay | Admitting: Neurology

## 2013-04-13 ENCOUNTER — Ambulatory Visit (INDEPENDENT_AMBULATORY_CARE_PROVIDER_SITE_OTHER): Payer: Medicare Other | Admitting: Neurology

## 2013-04-13 VITALS — BP 220/97 | HR 99 | Temp 97.4°F | Ht 60.5 in | Wt 120.0 lb

## 2013-04-13 DIAGNOSIS — I6350 Cerebral infarction due to unspecified occlusion or stenosis of unspecified cerebral artery: Secondary | ICD-10-CM

## 2013-04-13 DIAGNOSIS — I639 Cerebral infarction, unspecified: Secondary | ICD-10-CM

## 2013-04-13 DIAGNOSIS — R002 Palpitations: Secondary | ICD-10-CM

## 2013-04-13 DIAGNOSIS — Z87898 Personal history of other specified conditions: Secondary | ICD-10-CM

## 2013-04-13 DIAGNOSIS — I10 Essential (primary) hypertension: Secondary | ICD-10-CM

## 2013-04-13 DIAGNOSIS — E785 Hyperlipidemia, unspecified: Secondary | ICD-10-CM

## 2013-04-13 DIAGNOSIS — Z8669 Personal history of other diseases of the nervous system and sense organs: Secondary | ICD-10-CM

## 2013-04-13 NOTE — Patient Instructions (Addendum)
We will have you do outpatient rehab and have you take aspirin daily. As discussed, secondary prevention is key after a stroke. This means: taking care of blood sugar values or diabetes management, good blood pressure (hypertension) control and optimizing cholesterol management, exercising daily or regularly within your own mobility limitations of course and overall cardiovascular risk factor reduction, which includes screening for and treatment of obstructive sleep apnea (OSA).  Please ask family and friends or your significant other or bed partner if you snore and if so, how loud it is, and if you have breathing related issues in your sleep, such as: snorting sounds, choking sounds, pauses in your breathing or shallow breathing events. These may be symptoms of obstructive sleep apnea (OSA).

## 2013-04-13 NOTE — Progress Notes (Signed)
Subjective:    Patient ID: Stacey Ramirez is a 72 y.o. female.  HPI  Huston Foley, MD, PhD [facility] [address], [address] [address] [place], [place] [address]  Dear Dr. Renne Crigler,  I saw your patient, Stacey Ramirez, upon your kind request in my neurologic clinic today for initial consultation of her dizziness and gait disturbance and new onset right leg weakness for the past 4 days. The patient is accompanied by her husband today. As you know, [name] is a very pleasant 72 year old right-handed woman with an underlying medical history of hypertension and hypothyroidism, who has had a gait disturbance since 04/08/2013. She feels that her right leg is weak and she has no control over the leg. She denies any significant numbness. She feels her condition is progressive. She denies pain in her lower back or radiating pain. She had some dizziness a week prior. She had a brain MRI on 04/11/13, which I reviewed. She has an acute ischemic stroke in the L posterior internal capsule or corona radiata, perhaps L thalamic involvement. She may have had a couple of lacunar stroke before in the cerebellar hemispheres b/l.  Her vascular RF include: HLP, but could not tolerate Zocor some 16 years ago, as she had leg weakness. She has HTN for years. She has a FHx of stroke, HTN, and CAD. She has had palpitations. She had an echocardiogram some 6-8 months ago.  She had a brain MRI with and without contrast on 12/30/10 which showed age-appropriate volume loss and moderate white matter changes. No acute findings at the time.  Her Past Medical History Is Significant For: Past Medical History  Diagnosis Date  . Hypertension     LIABLE  . Hypercholesterolemia   . Hypothyroidism   . Vitamin D deficiency   . Palpitations   . History of dizziness   . LVH (left ventricular hypertrophy)   . Aortic stenosis   . Mitral regurgitation   . SOB (shortness of breath)   . Difficulty walking     Her Past  Surgical History Is Significant For: Past Surgical History  Procedure Laterality Date  . Cardiovascular stress test  2002    NORMAL  . Transthoracic echocardiogram  2008    SHOWED LEFT VENTRICULAR HYPERTROPHY AND MILD AORTIC STENOSIS    Her Family History Is Significant For: Family History  Problem Relation Age of Onset  . Heart attack Mother   . Alzheimer's disease Mother     Her Social History Is Significant For: History   Social History  . Marital Status: Married    Spouse Name: N/A    Number of Children: 2  . Years of Education: college   Occupational History  . retired    Social History Main Topics  . Smoking status: Never Smoker   . Smokeless tobacco: None  . Alcohol Use: Yes  . Drug Use: No  . Sexual Activity: None   Other Topics Concern  . None   Social History Narrative  . None    Her Allergies Are:  Allergies  Allergen Reactions  . Micardis [Telmisartan]   . Sulfa Drugs Cross Reactors   . Zocor [Simvastatin]   :   Her Current Medications Are:  Outpatient Encounter Prescriptions as of 04/13/2013  Medication Sig Dispense Refill  . ALPRAZolam (XANAX) 0.25 MG tablet Take 1 tablet (0.25 mg total) by mouth at bedtime as needed.  30 tablet  3  . Cholecalciferol (VITAMIN D PO) Take by mouth  daily. Taking occ.      . diltiazem 2 % GEL Apply 1 application topically 2 (two) times daily.  30 g  3  . estradiol (ESTRACE) 0.1 MG/GM vaginal cream Place 2 g vaginally as needed.        Marland Kitchen levothyroxine (SYNTHROID, LEVOTHROID) 50 MCG tablet Take 50 mcg by mouth daily.        Marland Kitchen losartan (COZAAR) 50 MG tablet       . LOTEMAX 0.5 % ophthalmic suspension       . Multiple Vitamin (MULTIVITAMIN) tablet Take 1 tablet by mouth daily. Takes occ.      Marland Kitchen NASONEX 50 MCG/ACT nasal spray       . trimethoprim (TRIMPEX) 100 MG tablet Take 100 mg by mouth as directed.      . [DISCONTINUED] telmisartan (MICARDIS) 20 MG tablet Take 20 mg by mouth as directed. 1/2 tablet daily        No facility-administered encounter medications on file as of 04/13/2013.  :   Review of Systems:  Out of a complete 14 point review of systems, all are reviewed and negative with the exception of these symptoms as listed below:   Review of Systems  Neurological: Positive for dizziness.    Objective:  Neurologic Exam  Physical Exam Physical Examination:   Filed Vitals:   04/13/13 1104  BP: 220/97  Pulse: 99  Temp:    Repeat BP was 180/88 and pulse was 96.   General Examination: The patient is a very pleasant 72 y.o. female in no acute distress. She appears well-developed and well-nourished and well groomed. She is very anxious and has pressured speech.  HEENT: Normocephalic, atraumatic, pupils are equal, round and reactive to light and accommodation. Funduscopic exam is normal with sharp disc margins noted. Extraocular tracking is good without limitation to gaze excursion or nystagmus noted. Normal smooth pursuit is noted. Hearing is grossly intact. Tympanic membranes are clear bilaterally. Face is symmetric with normal facial animation and normal facial sensation. Speech is clear with no dysarthria noted. There is no hypophonia. There is no lip, neck/head, jaw or voice tremor. Neck is supple with full range of passive and active motion. There is a R carotid bruit. Oropharynx exam reveals: moderate mouth dryness, adequate dental hygiene and no significant airway crowding. Mallampati is class II. Tongue protrudes centrally and palate elevates symmetrically.   Chest: Clear to auscultation without wheezing, rhonchi or crackles noted.  Heart: S1+S2+0, regular and normal without murmurs, rubs or gallops noted.   Abdomen: Soft, non-tender and non-distended with normal bowel sounds appreciated on auscultation.  Extremities: There is no pitting edema in the distal lower extremities bilaterally. Pedal pulses are intact.  Skin: Warm and dry without trophic changes noted. There are no  varicose veins.  Musculoskeletal: exam reveals no obvious joint deformities, tenderness or joint swelling or erythema.   Neurologically:  Mental status: The patient is awake, alert and oriented in all 4 spheres. Her memory, attention, language and knowledge are appropriate. There is no aphasia, agnosia, apraxia or anomia. Speech is clear with normal prosody and enunciation. Thought process is linear. Mood is congruent and affect is increased in intensity.  Cranial nerves are as described above under HEENT exam. In addition, shoulder shrug is normal with equal shoulder height noted. Motor exam: Normal bulk, strength and tone is noted in the UEs. She has 4-/5 in the L hip flexor, otherwise 5/5, in the RLE: 4/5. There is no drift, tremor or rebound. Romberg  is negative. Reflexes are 2+ throughout. Toes are downgoing bilaterally. Fine motor skills are intact with normal finger taps, normal hand movements, normal rapid alternating patting, normal foot taps and normal foot agility.  Cerebellar testing shows no dysmetria or intention tremor on finger to nose testing. Heel to shin is unremarkable on the L and mildly impaired on the R. There is no truncal or gait ataxia.  Sensory exam is intact to light touch, pinprick, vibration, temperature sense and proprioception in the upper and lower extremities.  Gait, station and balance: she stands up with insecurity and walks with assistance and slight circumduction on the R. No veering to one side is noted. No leaning to one side is noted. Posture is age-appropriate and stance is slightly wide-based. She turns in 4 steps.  Assessment and Plan:   In summary, TALISSA APPLE is a very pleasant 72 y.o.-year old female with a history of acute L sided ischemic stroke, resulting in R leg weakness. We will do stroke w/u and I will have her do PT and take a daily ASA. She and her husband have been advised about secondary prevention today: taking care of blood sugar values or  diabetes management, good blood pressure (hypertension) control and optimizing cholesterol management, exercising daily or regularly within her own mobility - after PT assessment. Overall cardiovascular risk factor reduction, which includes screening for and treatment of obstructive sleep apnea (OSA), which she currently denies. We talked about maintaining a healthy lifestyle in general. I encouraged the patient to eat healthy, exercise daily and keep well hydrated, to keep a scheduled bedtime and wake time routine, to not skip any meals and eat healthy snacks in between meals and to have protein with every meal. She is advised to use a cane until PT assessment.  As far as further diagnostic testing is concerned, I suggested the following today: echo, cardiac monitor for palpitations, MRA head and neck, blood work, including lipid.   As far as medications are concerned, I recommended the following at this time: no change. Continue baby ASA. I answered all their questions today and the patient and her husband were in agreement with the above outlined plan. I would like to see the patient back in 3 months, sooner if the need arises and encouraged them to call with any interim questions, concerns, problems or updates. If she has any new Sx, especially one sided weakness, numbness, slurring of speech, facial droop, they are to call 911.  Thank you very much for allowing me to participate in the care of this nice patient. If I can be of any further assistance to you please do not hesitate to call me at [phone].  Sincerely,   Huston Foley, MD, PhD

## 2013-04-14 ENCOUNTER — Other Ambulatory Visit (INDEPENDENT_AMBULATORY_CARE_PROVIDER_SITE_OTHER): Payer: Self-pay

## 2013-04-14 ENCOUNTER — Encounter: Payer: Self-pay | Admitting: Neurology

## 2013-04-14 DIAGNOSIS — Z0289 Encounter for other administrative examinations: Secondary | ICD-10-CM

## 2013-04-17 LAB — ANA W/REFLEX: Anti Nuclear Antibody(ANA): NEGATIVE

## 2013-04-17 LAB — SEDIMENTATION RATE: Sed Rate: 5 mm/hr (ref 0–40)

## 2013-04-17 LAB — CK TOTAL AND CKMB (NOT AT ARMC): CK-MB Index: 3.2 ng/mL — ABNORMAL HIGH (ref 0.0–2.9)

## 2013-04-17 LAB — LIPID PANEL
Cholesterol, Total: 331 mg/dL — ABNORMAL HIGH (ref 100–199)
VLDL Cholesterol Cal: 22 mg/dL (ref 5–40)

## 2013-04-18 ENCOUNTER — Telehealth: Payer: Self-pay | Admitting: Neurology

## 2013-04-18 NOTE — Progress Notes (Signed)
Quick Note:  I called and spoke to pt and gave her results. Will mail her copy as she has no set up MyChart. Also wanted faxed to Dr Renne Crigler, and Dr. Talmage Nap in his office for an appt she has on Friday. She verbalized understanding. ______

## 2013-04-18 NOTE — Progress Notes (Signed)
Quick Note:  Please advise patient that her blood work is back: Her cardiac enzyme showed a mild or borderline increase. If she has had any chest pain or shortness of breath or new onset cold sweats or clamminess she may need to be seen urgently for heart related issues. Otherwise her muscle enzymes are fine. Again this is only a borderline increase in her heart muscle enzyme and if she is symptom free she does not need any further workup for this. In addition, her diabetes marker was borderline at 5.8. This indicates that she may be at risk for diabetes. Furthermore, her cholesterol is rather high and needs to be treated. Please ask her to make a followup appointment with her primary care physician to address her diabetes risk and her cholesterol management. This is particularly important since she has a history of prior stroke and recent stroke. Please also fax blood work results to her primary care physician. Huston Foley, MD, PhD [facility]  ______

## 2013-04-19 ENCOUNTER — Telehealth: Payer: Self-pay | Admitting: Neurology

## 2013-04-19 ENCOUNTER — Ambulatory Visit
Admission: RE | Admit: 2013-04-19 | Discharge: 2013-04-19 | Disposition: A | Discharge status: Home / Self Care | Payer: Medicare Other | Source: Ambulatory Visit | Attending: Neurology | Admitting: Neurology

## 2013-04-19 DIAGNOSIS — I10 Essential (primary) hypertension: Secondary | ICD-10-CM

## 2013-04-19 DIAGNOSIS — I639 Cerebral infarction, unspecified: Secondary | ICD-10-CM

## 2013-04-19 DIAGNOSIS — I6340 Cerebral infarction due to embolism of unspecified cerebral artery: Secondary | ICD-10-CM

## 2013-04-19 DIAGNOSIS — E785 Hyperlipidemia, unspecified: Secondary | ICD-10-CM

## 2013-04-19 DIAGNOSIS — R002 Palpitations: Secondary | ICD-10-CM

## 2013-04-19 MED ORDER — GADOBENATE DIMEGLUMINE 529 MG/ML IV SOLN
10.0000 mL | Freq: Once | INTRAVENOUS | Status: AC | PRN
  Administered 2013-04-19: 10 mL via INTRAVENOUS

## 2013-04-19 NOTE — Telephone Encounter (Signed)
I spoke to pt and she was given [facility], whom she sees Dr. Patty Sermons for her additional testing: (echo and cardiac event monitor).  I told her that she may call them to see if order there and make appt.  If problems, Sandy P in referrals to assist her.   Bun/Creat still pending.

## 2013-04-21 ENCOUNTER — Telehealth: Payer: Self-pay | Admitting: *Deleted

## 2013-04-21 ENCOUNTER — Other Ambulatory Visit: Payer: Medicare Other

## 2013-04-21 ENCOUNTER — Telehealth: Payer: Self-pay | Admitting: Neurology

## 2013-04-21 ENCOUNTER — Encounter (INDEPENDENT_AMBULATORY_CARE_PROVIDER_SITE_OTHER): Payer: Medicare Other

## 2013-04-21 DIAGNOSIS — R002 Palpitations: Secondary | ICD-10-CM

## 2013-04-21 NOTE — Telephone Encounter (Signed)
30 day event monitor placed on Pt 04/21/13 TK 

## 2013-04-23 ENCOUNTER — Other Ambulatory Visit: Payer: Medicare Other

## 2013-04-25 ENCOUNTER — Telehealth: Payer: Self-pay | Admitting: Neurology

## 2013-04-25 ENCOUNTER — Encounter: Payer: Self-pay | Admitting: Neurology

## 2013-04-26 ENCOUNTER — Telehealth: Payer: Self-pay | Admitting: Neurology

## 2013-04-26 ENCOUNTER — Other Ambulatory Visit: Payer: Self-pay | Admitting: Endocrinology

## 2013-04-26 DIAGNOSIS — E049 Nontoxic goiter, unspecified: Secondary | ICD-10-CM

## 2013-04-26 NOTE — Telephone Encounter (Signed)
Please advise pt, that her blood vessel MRIs of her neck arteries and brain arteries did not show any significant tightening or what we call stenosis. There was mild diffuse atherosclerosis, which means hardening of the arteries. No specific problem or focal finding. In essence, as we discussed secondary prevention is key after a stroke. This means: taking care of blood sugar values or diabetes management, good blood pressure (hypertension) control and optimizing cholesterol management, exercising regularly.

## 2013-04-26 NOTE — Telephone Encounter (Signed)
I called patient to review findings of MRI. Patient would like MD to call to review findings and plan. Patient states, the longer she goes without knowing the higher her blood pressure goes and the higher her risks are.

## 2013-04-26 NOTE — Telephone Encounter (Signed)
Dr. Marjory Lies,  Would you kindly call this patient and explain what her MRI results mean and plan.  I appreciate your help.  Thank you.  Vikki Ports

## 2013-04-28 ENCOUNTER — Ambulatory Visit (HOSPITAL_COMMUNITY): Payer: Medicare Other | Attending: Neurology | Admitting: Radiology

## 2013-04-28 ENCOUNTER — Other Ambulatory Visit (HOSPITAL_COMMUNITY): Payer: Self-pay | Admitting: Neurology

## 2013-04-28 DIAGNOSIS — I639 Cerebral infarction, unspecified: Secondary | ICD-10-CM

## 2013-04-28 DIAGNOSIS — I10 Essential (primary) hypertension: Secondary | ICD-10-CM

## 2013-04-28 DIAGNOSIS — I6789 Other cerebrovascular disease: Secondary | ICD-10-CM | POA: Insufficient documentation

## 2013-04-28 DIAGNOSIS — I6350 Cerebral infarction due to unspecified occlusion or stenosis of unspecified cerebral artery: Secondary | ICD-10-CM

## 2013-04-28 DIAGNOSIS — R002 Palpitations: Secondary | ICD-10-CM

## 2013-04-28 DIAGNOSIS — E785 Hyperlipidemia, unspecified: Secondary | ICD-10-CM

## 2013-04-28 DIAGNOSIS — I079 Rheumatic tricuspid valve disease, unspecified: Secondary | ICD-10-CM | POA: Insufficient documentation

## 2013-04-28 NOTE — Progress Notes (Signed)
Echocardiogram performed.  

## 2013-04-28 NOTE — Telephone Encounter (Signed)
I called patient and relayed Dr. Teofilo Pod feedback. Patient feels better that Dr Frances Furbish does not seem highly concerned.

## 2013-05-02 ENCOUNTER — Encounter: Payer: Self-pay | Admitting: Neurology

## 2013-05-03 ENCOUNTER — Encounter: Payer: Self-pay | Admitting: Neurology

## 2013-05-03 LAB — SPECIMEN STATUS REPORT

## 2013-05-04 ENCOUNTER — Telehealth: Payer: Self-pay | Admitting: *Deleted

## 2013-05-04 LAB — BUN+CREAT
BUN: 12 mg/dL (ref 8–27)
GFR calc Af Amer: 102 mL/min/{1.73_m2} (ref >59)
GFR calc non Af Amer: 89 mL/min/{1.73_m2} (ref >59)

## 2013-05-04 NOTE — Telephone Encounter (Signed)
Per Dr. Frances Furbish, via S. Christell Constant, CMA,  pt informed of results of echocardiogram.

## 2013-05-12 ENCOUNTER — Telehealth: Payer: Self-pay | Admitting: *Deleted

## 2013-05-12 ENCOUNTER — Telehealth: Payer: Self-pay | Admitting: Cardiology

## 2013-05-12 NOTE — Telephone Encounter (Signed)
Scheduled ov with  Dr. Patty Sermons for next week

## 2013-05-12 NOTE — Telephone Encounter (Signed)
New Problem  Pt returning the call.

## 2013-05-12 NOTE — Telephone Encounter (Signed)
Left message to call back  

## 2013-05-12 NOTE — Telephone Encounter (Signed)
Received fax from [facility] showing sinus tachycardia.  Showed results to [employer].  She suggested that pt start Lopressor 50 mg BID and see Dr. Patty Sermons next week. When called pt she states that she has been rushing around this morning with errands and going to PT.She pressed the button on the monitor because she could feel her fast heart rate but states wasn't any different from what she has been having.  She states she is taking Losartan 50 mg BID per Dr. Renne Crigler.  He increased her meds about 5 weeks ago due to having a slight stroke. She states she can't take beta blockers and doesn't want to take another med for BP.  Also states she will be out of town next Friday when we scheduled her to see Dr. Patty Sermons. Spoke w/Lori again and advised her of correct medication dose of Losartan.  Per Lawson Fiscal advised to place results on Dr. Patty Sermons desk for him to review and advise pt to call if has any further episodes. Tried to call pt back and had to leave a message to continue same dose of Losartan and to call when she gets back from out of town to make an appointment w/Dr. Patty Sermons and if has any further episodes of "fast" heart rate to call office.

## 2013-05-18 ENCOUNTER — Ambulatory Visit (INDEPENDENT_AMBULATORY_CARE_PROVIDER_SITE_OTHER): Payer: Medicare Other | Admitting: Cardiology

## 2013-05-18 ENCOUNTER — Encounter: Payer: Self-pay | Admitting: Cardiology

## 2013-05-18 VITALS — BP 140/70 | HR 100 | Ht 62.0 in | Wt 120.0 lb

## 2013-05-18 DIAGNOSIS — I10 Essential (primary) hypertension: Secondary | ICD-10-CM

## 2013-05-18 DIAGNOSIS — R002 Palpitations: Secondary | ICD-10-CM

## 2013-05-18 DIAGNOSIS — I340 Nonrheumatic mitral (valve) insufficiency: Secondary | ICD-10-CM

## 2013-05-18 DIAGNOSIS — Z87898 Personal history of other specified conditions: Secondary | ICD-10-CM

## 2013-05-18 DIAGNOSIS — I059 Rheumatic mitral valve disease, unspecified: Secondary | ICD-10-CM

## 2013-05-18 DIAGNOSIS — R42 Dizziness and giddiness: Secondary | ICD-10-CM

## 2013-05-18 NOTE — Patient Instructions (Signed)
Your physician recommends that you continue on your current medications as directed. Please refer to the Current Medication list given to you today.  Your physician wants you to follow-up in: 3 months. You will receive a reminder letter in the mail two months in advance. If you don't receive a letter, please call our office to schedule the follow-up appointment.  

## 2013-05-18 NOTE — Progress Notes (Signed)
Stacey Ramirez Date of Birth:  Jun 29, 1941 [address] [place], [place]  [address] [phone]         Fax   [phone]  History of Present Illness: This pleasant 72 year old woman is seen for a work in followup office visit. She has a past history of hyperkinetic heart syndrome with palpitations and labile hypertension. She also has a history of hypercholesterolemia and a history of hypothyroidism. She is finishing up a 30 day monitor. Her palpitations and BP are improved on present higher dose of losartan 50 mg BID.  Current Outpatient Prescriptions  Medication Sig Dispense Refill  . ALPRAZolam (XANAX) 0.25 MG tablet Take 1 tablet (0.25 mg total) by mouth at bedtime as needed.  30 tablet  3  . Cholecalciferol (VITAMIN D PO) Take by mouth daily. Taking occ.      . diltiazem 2 % GEL Apply 1 application topically 2 (two) times daily.  30 g  3  . estradiol (ESTRACE) 0.1 MG/GM vaginal cream Place 2 g vaginally as needed.        Marland Kitchen levothyroxine (SYNTHROID, LEVOTHROID) 50 MCG tablet Take 50 mcg by mouth daily.        Marland Kitchen losartan (COZAAR) 50 MG tablet 50 mg 2 (two) times daily.       Marland Kitchen LOTEMAX 0.5 % ophthalmic suspension       . Multiple Vitamin (MULTIVITAMIN) tablet Take 1 tablet by mouth daily. Takes occ.      Marland Kitchen NASONEX 50 MCG/ACT nasal spray       . trimethoprim (TRIMPEX) 100 MG tablet Take 100 mg by mouth as directed.       No current facility-administered medications for this visit.    Allergies  Allergen Reactions  . Micardis [Telmisartan]   . Sulfa Drugs Cross Reactors   . Zocor [Simvastatin]     Patient Active Problem List   Diagnosis Date Noted  . Hypertension     Priority: High  . Hypercholesterolemia     Priority: High  . History of dizziness     Priority: High  . Heart palpitations 09/06/2012  . Hypothyroidism   . Vitamin D deficiency   . Palpitations   . LVH (left ventricular hypertrophy)   . Aortic stenosis   . Mitral regurgitation   . SOB  (shortness of breath)   . Difficulty walking     History  Smoking status  . Never Smoker   Smokeless tobacco  . Not on file    History  Alcohol Use  . Yes    Family History  Problem Relation Age of Onset  . Heart attack Mother   . Alzheimer's disease Mother     Review of Systems: Constitutional: no fever chills diaphoresis or fatigue or change in weight.  Head and neck: no hearing loss, no epistaxis, no photophobia or visual disturbance. Respiratory: No cough, shortness of breath or wheezing. Cardiovascular: No chest pain peripheral edema, palpitations. Gastrointestinal: No abdominal distention, no abdominal pain, no change in bowel habits hematochezia or melena. Genitourinary: No dysuria, no frequency, no urgency, no nocturia. Musculoskeletal:No arthralgias, no back pain, no gait disturbance or myalgias. Neurological: No dizziness, no headaches, no numbness, no seizures, no syncope, no weakness, no tremors. Hematologic: No lymphadenopathy, no easy bruising. Psychiatric: No confusion, no hallucinations, no sleep disturbance.    Physical Exam: Filed Vitals:   05/18/13 1151  BP: 140/70  Pulse: 100   the general appearance reveals a well-developed well-nourished active and talkative  middle-aged woman in no distress.The head and neck exam reveals pupils equal and reactive.  Extraocular movements are full.  There is no scleral icterus.  The mouth and pharynx are normal.  The neck is supple.  The carotids reveal no bruits.  There is a systolic bruit at the area of the base of the right neck possibly arising in the area of the innominate artery. The jugular venous pressure is normal.  The  thyroid is not enlarged.  There is no lymphadenopathy.  The chest is clear to percussion and auscultation.  There are no rales or rhonchi.  Expansion of the chest is symmetrical.  The precordium is quiet.  Occasional PACs are noted. The first heart sound is normal.  The second heart sound is  physiologically split.  There is no  gallop rub or click.  There is a grade 2/6 systolic ejection murmur at the left sternal edge and aortic area.  No diastolic murmur.  There is no abnormal lift or heave.  The abdomen is soft and nontender.  The bowel sounds are normal.  The liver and spleen are not enlarged.  There are no abdominal masses.  There are no abdominal bruits.  Extremities reveal good pedal pulses.  There is no phlebitis or edema.  There is no cyanosis or clubbing.  Strength is normal and symmetrical in all extremities.  There is no lateralizing weakness.  There are no sensory deficits.  The skin is warm and dry.  There is no rash.    Assessment / Plan: Continue current meds. If she has more SVT she may need to be willing to take BB on PRN basis. Recheck 3 months

## 2013-05-18 NOTE — Assessment & Plan Note (Signed)
She is not having any symptoms of CHF

## 2013-05-18 NOTE — Assessment & Plan Note (Signed)
She has had an extensive neuro workup recently.  MRA of neck shows no stenosis of internal carotid arteries.

## 2013-05-18 NOTE — Assessment & Plan Note (Signed)
BP control acceptable on present meds.

## 2013-05-19 ENCOUNTER — Ambulatory Visit: Payer: Medicare Other | Admitting: Cardiology

## 2013-05-27 ENCOUNTER — Encounter: Payer: Self-pay | Admitting: Cardiology

## 2013-06-01 ENCOUNTER — Telehealth: Payer: Self-pay | Admitting: Cardiology

## 2013-06-01 NOTE — Telephone Encounter (Signed)
New message    Want dr to know she is now taking crestor--prescribed by Dr Renne Crigler.  Want to talk to nurse about this.

## 2013-06-01 NOTE — Telephone Encounter (Signed)
Is starting Crestor 10 mg 1/4 tablet every other day, Dr Renne Crigler will recheck in 3 weeks. She just wanted to let you know.

## 2013-06-08 ENCOUNTER — Other Ambulatory Visit: Payer: Self-pay

## 2013-06-20 ENCOUNTER — Telehealth: Payer: Self-pay | Admitting: Cardiology

## 2013-06-20 NOTE — Telephone Encounter (Signed)
New message     Want monitor results 

## 2013-06-22 NOTE — Telephone Encounter (Signed)
Discussed with patient she will call back if any problems

## 2013-06-22 NOTE — Telephone Encounter (Signed)
Report given at recent office visit

## 2013-06-23 ENCOUNTER — Telehealth: Payer: Self-pay | Admitting: Cardiology

## 2013-06-23 NOTE — Telephone Encounter (Signed)
Rec'd from [facility] forward 3 pages to Dr.Brackbill

## 2013-08-16 ENCOUNTER — Ambulatory Visit: Payer: Medicare Other | Admitting: Cardiology

## 2013-08-17 ENCOUNTER — Ambulatory Visit (INDEPENDENT_AMBULATORY_CARE_PROVIDER_SITE_OTHER): Payer: Medicare HMO | Admitting: Neurology

## 2013-08-17 ENCOUNTER — Encounter: Payer: Self-pay | Admitting: Neurology

## 2013-08-17 VITALS — BP 191/74 | HR 52 | Temp 97.8°F | Ht 60.5 in | Wt 114.0 lb

## 2013-08-17 DIAGNOSIS — I639 Cerebral infarction, unspecified: Secondary | ICD-10-CM

## 2013-08-17 DIAGNOSIS — I6350 Cerebral infarction due to unspecified occlusion or stenosis of unspecified cerebral artery: Secondary | ICD-10-CM

## 2013-08-17 DIAGNOSIS — I10 Essential (primary) hypertension: Secondary | ICD-10-CM

## 2013-08-17 DIAGNOSIS — E785 Hyperlipidemia, unspecified: Secondary | ICD-10-CM

## 2013-08-17 NOTE — Patient Instructions (Signed)
I think overall you are doing fairly well and are stable at this point.   I do have some generic suggestions for you today:  Please make sure that you drink plenty of fluids. I would like for you to exercise daily for example in the form of walking 20-30 minutes every day, if you can. Please keep a regular sleep-wake schedule, keep regular meal times, do not skip any meals, eat  healthy snacks in between meals, such as fruit or nuts. Try to eat protein with every meal.   As far as your medications are concerned, I would like to suggest: no new medications.   Continue your medications as directed. As discussed, secondary prevention is key after a stroke. This means: taking care of blood sugar values or diabetes management, good blood pressure (hypertension) control and optimizing cholesterol management, exercising daily or regularly within your own mobility limitations of course and overall cardiovascular risk factor reduction.    As far as diagnostic testing, I recommend: no new test.  I do not think we need to make any changes in your medications at this point. I think you can follow up with your PCP.

## 2013-08-17 NOTE — Progress Notes (Signed)
Subjective:    Patient ID: Stacey Ramirez is a 73 y.o. female.  HPI  Interim history:   Stacey Ramirez is a very pleasant 73 year old right-handed woman with an underlying medical history of hypertension, hyperlipidemia, hypothyroidism, who presents for followup consultation of her gait disorder, in the context of a recent stroke. The patient is unaccompanied today. I first met her on 04/13/2013, and which time she felt she had a fairly recent and abrupt onset of gait disturbance since 04/08/2013. She reported right leg weakness. She had a brain MRI on 04/11/13, which showed an acute ischemic stroke in the L posterior internal capsule or corona radiata, perhaps L thalamic involvement.   She may have had a couple of lacunar stroke before in the cerebellar hemispheres b/l.   Her vascular RF include: HLP, but could not tolerate Zocor some 16 years ago, as she had leg weakness. She has HTN for years. She has a FHx of stroke, HTN, and CAD. She has had palpitations. She had an echocardiogram some 6-8 months ago.  She had a brain MRI with and without contrast on 12/30/10 which showed age-appropriate volume loss and moderate white matter changes. No acute findings at the time. At the time of her first visit in September with me I suggested that she have a echocardiogram and that she continue baby aspirin. I ordered a cardiac monitor for her history of palpitations as well as MRA head and neck as well as blood work. Her blood work was unremarkable with the exception of elevated cholesterol. She has since been started on Crestor. She had a 30 day cardiac monitor which was not significant and MRAs were not significant enough. She had the tests on 04/19/2013: Mildly abnormal MRA head (without) demonstrating mild, diffuse intracranial atherosclerosis. No significant focal stenosis or occlusion. Abnormal MRA neck (with and without) demonstrating: 1. The left vertebral artery has focal stenosis (~50%) near the  vertebro-basilar junction.  2. The right external carotid artery has focal stenosis (~66%). 3. Bilateral internal carotid and right vertebral arteries have no stenosis.  She had a transthoracic echocardiogram on 04/28/2013: Normal LV size with mild LV hypertrophy. EF 60-65%. Normal RV size and systolic function. No significant valvular abnormalities. We called her with the test results. I referred her to physical therapy and advised to continue baby ASA.   Today, she reports doing much better, she is taking a touch of Crestor, 10 mg 1/4 pill qod. She has been treated for recurrent UTI. Her BP is better since she is on amlodipine.   Her Past Medical History Is Significant For: Past Medical History  Diagnosis Date  . Hypertension     LIABLE  . Hypercholesterolemia   . Hypothyroidism   . Vitamin D deficiency   . Palpitations   . History of dizziness   . LVH (left ventricular hypertrophy)   . Aortic stenosis   . Mitral regurgitation   . SOB (shortness of breath)   . Difficulty walking   . Cerebrovascular accident (stroke)     Her Past Surgical History Is Significant For: Past Surgical History  Procedure Laterality Date  . Cardiovascular stress test  2002    NORMAL  . Transthoracic echocardiogram  2008    SHOWED LEFT VENTRICULAR HYPERTROPHY AND MILD AORTIC STENOSIS    Her Family History Is Significant For: Family History  Problem Relation Age of Onset  . Heart attack Mother   . Alzheimer's disease Mother     Her Social History Is Significant For:  History   Social History  . Marital Status: Married    Spouse Name: N/A    Number of Children: 2  . Years of Education: college   Occupational History  . retired    Social History Main Topics  . Smoking status: Never Smoker   . Smokeless tobacco: None  . Alcohol Use: Yes  . Drug Use: No  . Sexual Activity: None   Other Topics Concern  . None   Social History Narrative  . None    Her Allergies Are:  Allergies   Allergen Reactions  . Micardis [Telmisartan]   . Sulfa Drugs Cross Reactors   . Zocor [Simvastatin]   :   Her Current Medications Are:  Outpatient Encounter Prescriptions as of 08/17/2013  Medication Sig  . ALPRAZolam (XANAX) 0.25 MG tablet Take 1 tablet (0.25 mg total) by mouth at bedtime as needed.  Marland Kitchen aspirin 81 MG tablet Take 81 mg by mouth daily.  . Cholecalciferol (VITAMIN D PO) Take by mouth daily. Taking occ.  . Coenzyme Q10 (CO Q-10) 100 MG CAPS Take 1 tablet by mouth daily.  Marland Kitchen diltiazem 2 % GEL Apply 1 application topically 2 (two) times daily.  Marland Kitchen estradiol (ESTRACE) 0.1 MG/GM vaginal cream Place 2 g vaginally as needed.    Marland Kitchen levothyroxine (SYNTHROID, LEVOTHROID) 50 MCG tablet Take 50 mcg by mouth daily.    Marland Kitchen losartan (COZAAR) 50 MG tablet 50 mg 2 (two) times daily.   Marland Kitchen LOTEMAX 0.5 % ophthalmic suspension   . Multiple Vitamin (MULTIVITAMIN) tablet Take 1 tablet by mouth daily. Takes occ.  Marland Kitchen NASONEX 50 MCG/ACT nasal spray   . rosuvastatin (CRESTOR) 10 MG tablet Take 10 mg by mouth as directed. 1/4 tablet every other day  . trimethoprim (TRIMPEX) 100 MG tablet Take 100 mg by mouth as directed.  :  Review of Systems:  Out of a complete 14 point review of systems, all are reviewed and negative with the exception of these symptoms as listed below:   Review of Systems  Constitutional: Negative.   HENT: Negative.   Eyes: Negative.   Respiratory: Negative.   Cardiovascular: Negative.   Gastrointestinal: Negative.   Endocrine: Negative.   Genitourinary: Negative.   Musculoskeletal: Negative.   Skin: Negative.   Allergic/Immunologic: Negative.   Neurological: Negative.   Hematological: Negative.   Psychiatric/Behavioral: Negative.   All other systems reviewed and are negative.    Objective:  Neurologic Exam  Physical Exam Physical Examination:   Filed Vitals:   08/17/13 1236  BP: 191/74  Pulse: 52  Temp: 97.8 F (36.6 C)    General Examination: The patient  is a very pleasant 73 y.o. female in no acute distress. She appears well-developed and well-nourished and well groomed. She is less anxious and in good spirits today.   HEENT: Normocephalic, atraumatic, pupils are equal, round and reactive to light and accommodation. Funduscopic exam is normal with sharp disc margins noted. Extraocular tracking is good without limitation to gaze excursion or nystagmus noted. Normal smooth pursuit is noted. Hearing is grossly intact. Tympanic membranes are clear bilaterally. Face is symmetric with normal facial animation and normal facial sensation. Speech is clear with no dysarthria noted. There is no hypophonia. There is no lip, neck/head, jaw or voice tremor. Neck is supple with full range of passive and active motion. There is a R carotid bruit. Oropharynx exam reveals: moderate mouth dryness, adequate dental hygiene and no significant airway crowding. Mallampati is class II. Tongue protrudes centrally  and palate elevates symmetrically.   Chest: Clear to auscultation without wheezing, rhonchi or crackles noted.  Heart: S1+S2+0, regular and normal without murmurs, rubs or gallops noted.   Abdomen: Soft, non-tender and non-distended with normal bowel sounds appreciated on auscultation.  Extremities: There is no pitting edema in the distal lower extremities bilaterally. Pedal pulses are intact.  Skin: Warm and dry without trophic changes noted. There are no varicose veins.  Musculoskeletal: exam reveals no obvious joint deformities, tenderness or joint swelling or erythema.   Neurologically:  Mental status: The patient is awake, alert and oriented in all 4 spheres. Her memory, attention, language and knowledge are appropriate. There is no aphasia, agnosia, apraxia or anomia. Speech is clear with normal prosody and enunciation. Thought process is linear. Mood is congruent and affect is increased in intensity.  Cranial nerves are as described above under HEENT exam.  In addition, shoulder shrug is normal with equal shoulder height noted. Motor exam: Normal bulk, strength and tone is noted in the UEs. She has 5/5 strength throughout. here is no drift, tremor or rebound. Romberg is negative. Reflexes are 3+ throughout. Toes are downgoing bilaterally. Fine motor skills are intact with normal finger taps, normal hand movements, normal rapid alternating patting, normal foot taps and normal foot agility.  Cerebellar testing shows no dysmetria or intention tremor on finger to nose testing. Heel to shin is unremarkable b/l. There is no truncal or gait ataxia.  Sensory exam is intact to light touch, pinprick, vibration, temperature sense in the upper and lower extremities.  Gait, station and balance: she stands up with no problem and needs no assistance. She walks much better, with no residual circumduction on the R. No veering to one side is noted. No leaning to one side is noted. Posture is age-appropriate and stance is slightly wide-based. She turns without problems.   Assessment and Plan:   In summary, SHAUNIE BOEHM is a very pleasant 73 year old female with a history of acute L sided ischemic stroke, resulting in R leg weakness. We have completed stroke workup and she has completed physical therapy. She continues to take daily baby aspirin and has been placed on Crestor low dose which has brought down her cholesterol she reports. She her exam is improved and she is doing well at this time. I talked to her and her husband again about secondary stroke prevention today: taking care of blood sugar values or diabetes management, good blood pressure (hypertension) control and optimizing cholesterol management, exercising daily or regularly within her own mobility - after PT assessment. Overall cardiovascular risk factor reduction, which includes screening for and treatment of obstructive sleep apnea (OSA), which she currently denies. We talked about maintaining a healthy lifestyle  in general. I encouraged the patient to eat healthy, exercise daily and keep well hydrated, to keep a scheduled bedtime and wake time routine, to not skip any meals and eat healthy snacks in between meals and to have protein with every meal. She is advised to stay active physically. She does not need any additional testing at this time from my end. I feel she is stable to continue to followup with her primary care physician, Dr. Shelia Media.  I answered all their questions today and the patient and her husband were in agreement with the above.

## 2013-10-09 ENCOUNTER — Encounter: Payer: Self-pay | Admitting: Cardiology

## 2013-10-09 ENCOUNTER — Ambulatory Visit (INDEPENDENT_AMBULATORY_CARE_PROVIDER_SITE_OTHER): Payer: Commercial Managed Care - HMO | Admitting: Cardiology

## 2013-10-09 VITALS — BP 167/79 | HR 92 | Ht 60.0 in | Wt 115.0 lb

## 2013-10-09 DIAGNOSIS — R262 Difficulty in walking, not elsewhere classified: Secondary | ICD-10-CM

## 2013-10-09 DIAGNOSIS — F411 Generalized anxiety disorder: Secondary | ICD-10-CM

## 2013-10-09 DIAGNOSIS — I119 Hypertensive heart disease without heart failure: Secondary | ICD-10-CM

## 2013-10-09 DIAGNOSIS — I340 Nonrheumatic mitral (valve) insufficiency: Secondary | ICD-10-CM

## 2013-10-09 DIAGNOSIS — I10 Essential (primary) hypertension: Secondary | ICD-10-CM

## 2013-10-09 DIAGNOSIS — I059 Rheumatic mitral valve disease, unspecified: Secondary | ICD-10-CM

## 2013-10-09 DIAGNOSIS — R002 Palpitations: Secondary | ICD-10-CM

## 2013-10-09 DIAGNOSIS — F419 Anxiety disorder, unspecified: Secondary | ICD-10-CM

## 2013-10-09 MED ORDER — ALPRAZOLAM 0.25 MG PO TABS: 0.2500 mg | ORAL_TABLET | Freq: Every day | ORAL | Status: DC | PRN

## 2013-10-09 MED ORDER — HYDROCHLOROTHIAZIDE 12.5 MG PO CAPS: ORAL_CAPSULE | ORAL | Status: DC

## 2013-10-09 NOTE — Assessment & Plan Note (Signed)
She has a history of a prior stroke noted on MRI.  She is now on a daily baby aspirin.  She has had no further TIA symptoms.

## 2013-10-09 NOTE — Assessment & Plan Note (Signed)
The patient has a history of prior mild mitral regurgitation.  Her most recent echocardiogram 04/28/13 did not show any significant mitral regurgitation.  She is not having any symptoms of congestive heart failure.  She has normal ejection fraction of 60-65% with grade 1 diastolic dysfunction.

## 2013-10-09 NOTE — Patient Instructions (Signed)
START HYDROCHLOROTHIAZIDE 12.5 MG ON Monday, Wednesday, AND Friday ONLY  Your physician recommends that you schedule a follow-up appointment in: [place]

## 2013-10-09 NOTE — Progress Notes (Signed)
Stacey Ramirez Date of Birth:  May 24, 1941 [address] [place], [place]  [address] [phone]         Fax   [phone]  History of Present Illness: This pleasant 73 year old woman is seen for a  followup office visit. She has a past history of hyperkinetic heart syndrome with palpitations and labile hypertension. She also has a history of hypercholesterolemia and a history of hypothyroidism.  She continues to have a lot of problems with labile hypertension.  She is able to tolerate only 2.5 mg of amlodipine.  Any higher dose she gets intractable edema.  She was previously placed on diuretics which caused her blood pressure to be too low.  She thinks that she was on Lasix.  At the present time she is on no diuretic and blood pressure has been in the 295-284 systolic range. The patient has a history of mild diabetes.  She checks her blood sugar at home and it ranges between 60 and 130.  She brought in the results from his recent lipid panel dated 09/27/13 which showed total cholesterol 220 with HDL 81 and LDL 124.  Her last echocardiogram was 04/28/13 and showed an ejection fraction of 60-65% with grade 1 diastolic dysfunction.  There were no significant valvular abnormalities.  Current Outpatient Prescriptions  Medication Sig Dispense Refill  . ALPRAZolam (XANAX) 0.25 MG tablet Take 1 tablet (0.25 mg total) by mouth at bedtime as needed.  30 tablet  3  . amLODipine (NORVASC) 2.5 MG tablet Take 2.5 mg by mouth daily.       Marland Kitchen aspirin 81 MG tablet Take 81 mg by mouth daily.      . Coenzyme Q10 (CO Q-10) 100 MG CAPS Take 1 tablet by mouth daily.      Marland Kitchen diltiazem 2 % GEL Apply 1 application topically 2 (two) times daily.  30 g  3  . estradiol (ESTRACE) 0.1 MG/GM vaginal cream Place 2 g vaginally as needed.        Marland Kitchen levothyroxine (SYNTHROID, LEVOTHROID) 50 MCG tablet Take 50 mcg by mouth daily.        Marland Kitchen losartan (COZAAR) 50 MG tablet 50 mg 2 (two) times daily.       Marland Kitchen LOTEMAX 0.5 %  ophthalmic suspension       . Multiple Vitamin (MULTIVITAMIN) tablet Take 1 tablet by mouth daily. Takes occ.      Marland Kitchen NASONEX 50 MCG/ACT nasal spray       . rosuvastatin (CRESTOR) 10 MG tablet Take 10 mg by mouth as directed. 1/4 tablet every other day      . trimethoprim (TRIMPEX) 100 MG tablet Take 100 mg by mouth as directed.      . Cholecalciferol (VITAMIN D PO) Take by mouth daily. Taking occ.      . hydrochlorothiazide (MICROZIDE) 12.5 MG capsule 1 Capsule on Monday, Wednesday, AND Friday ONLY  30 capsule  5   No current facility-administered medications for this visit.    Allergies  Allergen Reactions  . Micardis [Telmisartan]   . Sulfa Drugs Cross Reactors   . Zocor [Simvastatin]     Patient Active Problem List   Diagnosis Date Noted  . Hypertension     Priority: High  . Hypercholesterolemia     Priority: High  . History of dizziness     Priority: High  . Heart palpitations 09/06/2012  . Hypothyroidism   . Vitamin D deficiency   . Palpitations   .  LVH (left ventricular hypertrophy)   . Aortic stenosis   . Mitral regurgitation   . SOB (shortness of breath)   . Difficulty walking     History  Smoking status  . Never Smoker   Smokeless tobacco  . Not on file    History  Alcohol Use  . Yes    Family History  Problem Relation Age of Onset  . Heart attack Mother   . Alzheimer's disease Mother     Review of Systems: Constitutional: no fever chills diaphoresis or fatigue or change in weight.  Head and neck: no hearing loss, no epistaxis, no photophobia or visual disturbance. Respiratory: No cough, shortness of breath or wheezing. Cardiovascular: No chest pain peripheral edema, palpitations. Gastrointestinal: No abdominal distention, no abdominal pain, no change in bowel habits hematochezia or melena. Genitourinary: No dysuria, no frequency, no urgency, no nocturia. Musculoskeletal:No arthralgias, no back pain, no gait disturbance or  myalgias. Neurological: No dizziness, no headaches, no numbness, no seizures, no syncope, no weakness, no tremors. Hematologic: No lymphadenopathy, no easy bruising. Psychiatric: No confusion, no hallucinations, no sleep disturbance.    Physical Exam: Filed Vitals:   10/09/13 1412  BP: 167/79  Pulse: 92   the general appearance reveals a well-developed well-nourished active and talkative middle-aged woman in no distress.The head and neck exam reveals pupils equal and reactive.  Extraocular movements are full.  There is no scleral icterus.  The mouth and pharynx are normal.  The neck is supple.  The carotids reveal no bruits.  There is a systolic bruit at the area of the base of the right neck possibly arising in the area of the innominate artery. The jugular venous pressure is normal.  The  thyroid is not enlarged.  There is no lymphadenopathy.  The chest is clear to percussion and auscultation.  There are no rales or rhonchi.  Expansion of the chest is symmetrical.  The precordium is quiet.  Occasional PACs are noted. The first heart sound is normal.  The second heart sound is physiologically split.  There is no  gallop rub or click.  There is a grade 2/6 systolic ejection murmur at the left sternal edge and aortic area.  No diastolic murmur.  There is no abnormal lift or heave.  The abdomen is soft and nontender.  The bowel sounds are normal.  The liver and spleen are not enlarged.  There are no abdominal masses.  There are no abdominal bruits.  Extremities reveal good pedal pulses.  There is no phlebitis or edema.  There is no cyanosis or clubbing.  Strength is normal and symmetrical in all extremities.  There is no lateralizing weakness.  There are no sensory deficits.  The skin is warm and dry.  There is no rash.    Assessment / Plan: Blood pressure control is still not adequate.  Add hydrochlorothiazide 12.5 mg on Monday Wednesday and Friday for additional blood pressure control. Recheck in 3  months for followup office visit.  She will also use a fraction of her Xanax tablet on a when necessary basis to help with systolic hypertension episodes.

## 2013-10-09 NOTE — Assessment & Plan Note (Signed)
Blood pressure is presently too high on current medications.  We will add hydrochlorothiazide 12.5 mg on Monday Wednesday and Friday only.  She will supplement potassium with bananas.

## 2013-10-09 NOTE — Assessment & Plan Note (Signed)
The patient is noticing occasional individual palpitations.  No sustained arrhythmia or SVT.  Continue current meds

## 2013-10-13 ENCOUNTER — Telehealth: Payer: Self-pay | Admitting: Cardiology

## 2013-10-13 NOTE — Telephone Encounter (Signed)
Okay for novocaine. Reduce amlodipine to QOD

## 2013-10-13 NOTE — Telephone Encounter (Signed)
Since BP running low try decreasing losartan to 50 mg once a day in the morning.

## 2013-10-13 NOTE — Telephone Encounter (Signed)
Advised patient

## 2013-10-13 NOTE — Telephone Encounter (Signed)
New message         Pt has a question about taking novocain

## 2013-10-13 NOTE — Telephone Encounter (Signed)
1) Would prefer to change Amlodipine if that is appropriate  2) Novocain ?  Will forward to  Dr. Mare Ferrari for review

## 2013-10-13 NOTE — Telephone Encounter (Signed)
1)Patient states she has used Novocain in the past and wants to make sure ok to use  2)Patient has been monitoring blood pressure at home and it is still up and down. This am before medications is was 127/84 but the last 2 nights it has dropped to 90's (one reading 90/67) and she felt weak. Patient is concerned with the low readings.   Will forward to  Dr. Mare Ferrari for review

## 2014-01-11 ENCOUNTER — Ambulatory Visit (INDEPENDENT_AMBULATORY_CARE_PROVIDER_SITE_OTHER): Payer: Commercial Managed Care - HMO | Admitting: Cardiology

## 2014-01-11 ENCOUNTER — Encounter: Payer: Self-pay | Admitting: Cardiology

## 2014-01-11 VITALS — BP 182/83 | HR 83 | Ht 61.0 in | Wt 115.0 lb

## 2014-01-11 DIAGNOSIS — I350 Nonrheumatic aortic (valve) stenosis: Secondary | ICD-10-CM

## 2014-01-11 DIAGNOSIS — E7800 Pure hypercholesterolemia, unspecified: Secondary | ICD-10-CM

## 2014-01-11 DIAGNOSIS — I359 Nonrheumatic aortic valve disorder, unspecified: Secondary | ICD-10-CM

## 2014-01-11 DIAGNOSIS — I10 Essential (primary) hypertension: Secondary | ICD-10-CM

## 2014-01-11 NOTE — Assessment & Plan Note (Signed)
The patient has a past history of aortic valve sclerosis without significant gradient.  She does have a prominent systolic murmur at the base which radiates to the neck.  She has not been having any symptoms of congestive heart failure.

## 2014-01-11 NOTE — Assessment & Plan Note (Signed)
The patient is attempting to bring down her cholesterol with very low dose of Crestor on alternate days.

## 2014-01-11 NOTE — Assessment & Plan Note (Signed)
The patient continues to have significant wide swings in her blood pressure.  She is unable to tolerate much amlodipine because of peripheral edema.  Dr.Pharr has been following her closely in regard to adjusting her medication.

## 2014-01-11 NOTE — Patient Instructions (Signed)
Your physician recommends that you continue on your current medications as directed. Please refer to the Current Medication list given to you today.  Your physician wants you to follow-up in: 6 month ov/ekg You will receive a reminder letter in the mail two months in advance. If you don't receive a letter, please call our office to schedule the follow-up appointment.  

## 2014-01-11 NOTE — Progress Notes (Signed)
Stacey Ramirez Date of Birth:  1941/07/09 Stacey [place], [name]  [address] [phone]        Fax   [phone]   History of Present Illness: This pleasant 73 year old woman is seen for a followup office visit. She has a past history of hyperkinetic heart syndrome with palpitations and labile hypertension. She also has a history of hypercholesterolemia and a history of hypothyroidism. She continues to have a lot of problems with labile hypertension. She is able to tolerate only 2.5 mg of amlodipine. Any higher dose she gets intractable edema. She was previously placed on diuretics which caused her blood pressure to be too low. She thinks that she was on Lasix. At the present time she is on no diuretic and blood pressure has been in the 841-660 systolic range.  The patient has a history of mild diabetes. She checks her blood sugar at home and it ranges between 60 and 130. She brought in the results from his recent lipid panel dated 09/27/13 which showed total cholesterol 220 with HDL 81 and LDL 124. Her last echocardiogram was 04/28/13 and showed an ejection fraction of 60-65% with grade 1 diastolic dysfunction. There were no significant valvular abnormalities. She had what was described as a light stroke in September 2014.  Left her with some residual right leg weakness.  She was evaluated by neurology.  She was not hospitalized.  Current Outpatient Prescriptions  Medication Sig Dispense Refill  . ALPRAZolam (XANAX) 0.25 MG tablet Take 1 tablet (0.25 mg total) by mouth daily as needed.  30 tablet  3  . aspirin 81 MG tablet Take 81 mg by mouth daily.      . Cholecalciferol (VITAMIN D PO) Take by mouth daily. Taking occ.      . Coenzyme Q10 (CO Q-10) 100 MG CAPS Take 1 tablet by mouth daily.      Marland Kitchen diltiazem 2 % GEL Apply 1 application topically 2 (two) times daily.  30 g  3  . estradiol (ESTRACE) 0.1 MG/GM vaginal cream Place 2 g vaginally as needed.         . hydrochlorothiazide (MICROZIDE) 12.5 MG capsule 1 Capsule on Monday, Wednesday, AND Friday ONLY  30 capsule  5  . levothyroxine (SYNTHROID, LEVOTHROID) 50 MCG tablet Take 50 mcg by mouth daily.        Marland Kitchen losartan (COZAAR) 50 MG tablet 50 mg 2 (two) times daily.       Marland Kitchen LOTEMAX 0.5 % ophthalmic suspension       . Multiple Vitamin (MULTIVITAMIN) tablet Take 1 tablet by mouth daily. Takes occ.      Marland Kitchen NASONEX 50 MCG/ACT nasal spray       . rosuvastatin (CRESTOR) 10 MG tablet Take 10 mg by mouth as directed. 1/4 tablet every other day      . trimethoprim (TRIMPEX) 100 MG tablet Take 100 mg by mouth as directed.      Marland Kitchen amLODipine (NORVASC) 2.5 MG tablet Take 2.5 mg by mouth every other day.        No current facility-administered medications for this visit.    Allergies  Allergen Reactions  . Micardis [Telmisartan]   . Sulfa Drugs Cross Reactors   . Zocor [Simvastatin]     Patient Active Problem List   Diagnosis Date Noted  . Hypertension     Priority: High  . Hypercholesterolemia     Priority: High  . History of dizziness  Priority: High  . Heart palpitations 09/06/2012  . Hypothyroidism   . Vitamin D deficiency   . Palpitations   . LVH (left ventricular hypertrophy)   . Aortic stenosis   . Mitral regurgitation   . SOB (shortness of breath)   . Difficulty walking     History  Smoking status  . Never Smoker   Smokeless tobacco  . Not on file    History  Alcohol Use  . Yes    Family History  Problem Relation Age of Onset  . Heart attack Mother   . Alzheimer's disease Mother     Review of Systems: Constitutional: no fever chills diaphoresis or fatigue or change in weight.  Head and neck: no hearing loss, no epistaxis, no photophobia or visual disturbance. Respiratory: No cough, shortness of breath or wheezing. Cardiovascular: No chest pain peripheral edema, palpitations. Gastrointestinal: No abdominal distention, no abdominal pain, no change in bowel  habits hematochezia or melena. Genitourinary: No dysuria, no frequency, no urgency, no nocturia. Musculoskeletal:No arthralgias, no back pain, no gait disturbance or myalgias. Neurological: No dizziness, no headaches, no numbness, no seizures, no syncope, no weakness, no tremors. Hematologic: No lymphadenopathy, no easy bruising. Psychiatric: No confusion, no hallucinations, no sleep disturbance.    Physical Exam: Filed Vitals:   01/11/14 1046  BP: 182/83  Pulse: 83   the general appearance reveals a very anxious hyper kinetic woman in no distress.The head and neck exam reveals pupils equal and reactive.  Extraocular movements are full.  There is no scleral icterus.  The mouth and pharynx are normal.  The neck is supple.  The carotids reveal bilateral bruit louder on the right than the left radiating from the heart  The jugular venous pressure is normal.  The  thyroid is not enlarged.  There is no lymphadenopathy.  The chest is clear to percussion and auscultation.  There are no rales or rhonchi.  Expansion of the chest is symmetrical.  The precordium is quiet.  The first heart sound is normal.  The second heart sound is physiologically split.  There is grade 2/6 systolic ejection murmur at the base and soft apical systolic murmur.  There is no abnormal lift or heave.  The abdomen is soft and nontender.  The bowel sounds are normal.  The liver and spleen are not enlarged.  There are no abdominal masses.  There are no abdominal bruits.  Extremities reveal good pedal pulses.  There is no phlebitis or edema.  There is no cyanosis or clubbing.  Strength is normal and symmetrical in all extremities.  There is no lateralizing weakness.  There are no sensory deficits.  The skin is warm and dry.  There is no rash.     Assessment / Plan: 1.  Benign hypertensive heart disease without heart failure 2. Mitral regurgitation 3. Anxiety 4. heart palpitations  Continue current medication.  Recheck in 6  months for office visit and EKG

## 2014-04-16 ENCOUNTER — Ambulatory Visit
Admission: RE | Admit: 2014-04-16 | Discharge: 2014-04-16 | Disposition: A | Discharge status: Home / Self Care | Payer: Commercial Managed Care - HMO | Source: Ambulatory Visit | Attending: Endocrinology | Admitting: Endocrinology

## 2014-04-16 DIAGNOSIS — E049 Nontoxic goiter, unspecified: Secondary | ICD-10-CM

## 2014-07-11 ENCOUNTER — Ambulatory Visit: Payer: Commercial Managed Care - HMO | Admitting: Cardiology

## 2014-08-30 ENCOUNTER — Ambulatory Visit: Payer: Commercial Managed Care - HMO | Admitting: Cardiology

## 2014-10-04 ENCOUNTER — Ambulatory Visit (INDEPENDENT_AMBULATORY_CARE_PROVIDER_SITE_OTHER): Payer: PPO | Admitting: Cardiology

## 2014-10-04 ENCOUNTER — Encounter: Payer: Self-pay | Admitting: Cardiology

## 2014-10-04 VITALS — BP 198/100 | HR 86 | Ht 61.0 in | Wt 117.4 lb

## 2014-10-04 DIAGNOSIS — R002 Palpitations: Secondary | ICD-10-CM

## 2014-10-04 DIAGNOSIS — I340 Nonrheumatic mitral (valve) insufficiency: Secondary | ICD-10-CM

## 2014-10-04 DIAGNOSIS — I119 Hypertensive heart disease without heart failure: Secondary | ICD-10-CM

## 2014-10-04 DIAGNOSIS — I493 Ventricular premature depolarization: Secondary | ICD-10-CM

## 2014-10-04 MED ORDER — HYDROCHLOROTHIAZIDE 12.5 MG PO CAPS: ORAL_CAPSULE | ORAL | Status: DC

## 2014-10-04 NOTE — Patient Instructions (Addendum)
RESTART MICROZIDE 12.5 MG Monday, Wednesday, AND Friday ONLY  Your physician wants you to follow-up in: [place] will receive a reminder letter in the mail two months in advance. If you don't receive a letter, please call our office to schedule the follow-up appointment.

## 2014-10-04 NOTE — Progress Notes (Signed)
Cardiology Office Note   Date:  10/04/2014   ID:  Stacey Ramirez, DOB 1941/06/30, MRN [id number]  PCP:  Horatio Pel, MD  Cardiologist:   Darlin Coco, MD   Chief Complaint  Patient presents with  . Hypertension      History of Present Illness: Stacey Ramirez is a 74 y.o. female who presents for office visit.  This pleasant 74 year old woman is seen for a followup office visit. She has a past history of hyperkinetic heart syndrome with palpitations and labile hypertension. She also has a history of hypercholesterolemia and a history of hypothyroidism. She continues to have a lot of problems with labile hypertension. She is able to tolerate only 2.5 mg of amlodipine. Any higher dose she gets intractable edema. She was previously placed on diuretics which caused her blood pressure to be too low. She thinks that she was on Lasix. At the present time she is on no diuretic and blood pressure has been in the 283-662 systolic range.  The patient has a history of mild diabetes. She checks her blood sugar at home and it ranges between 60 and 130. She brought in the results from his recent lipid panel dated 09/27/13 which showed total cholesterol 220 with HDL 81 and LDL 124. Her last echocardiogram was 04/28/13 and showed an ejection fraction of 60-65% with grade 1 diastolic dysfunction. There were no significant valvular abnormalities. She had what was described as a light stroke in September 2014. Left her with some residual right leg weakness. She was evaluated by neurology. She was not hospitalized. In January the patient had a minor fall in her home.  She did not suffer any broken bones.  Since then her blood pressure has been very labile.  She has been experiencing remittent pounding of her heart.  Blood pressure today was high.  She has not been on her hydrochlorothiazide recently. She has been having more problems with weakness and dizziness and she states that Dr. Shelia Media has ordered  some home physical therapy which will begin next week.   Past Medical History  Diagnosis Date  . Hypertension     LIABLE  . Hypercholesterolemia   . Hypothyroidism   . Vitamin D deficiency   . Palpitations   . History of dizziness   . LVH (left ventricular hypertrophy)   . Aortic stenosis   . Mitral regurgitation   . SOB (shortness of breath)   . Difficulty walking   . Cerebrovascular accident (stroke)     Past Surgical History  Procedure Laterality Date  . Cardiovascular stress test  2002    NORMAL  . Transthoracic echocardiogram  2008    SHOWED LEFT VENTRICULAR HYPERTROPHY AND MILD AORTIC STENOSIS     Current Outpatient Prescriptions  Medication Sig Dispense Refill  . ALPRAZolam (XANAX) 0.25 MG tablet Take 1 tablet (0.25 mg total) by mouth daily as needed. 30 tablet 3  . amLODipine (NORVASC) 2.5 MG tablet Take 2.5 mg by mouth every other day.     Marland Kitchen aspirin 81 MG tablet Take 81 mg by mouth daily.    . Cholecalciferol (VITAMIN D PO) Take by mouth daily. Taking occ.    . Coenzyme Q10 (CO Q-10) 100 MG CAPS Take 1 tablet by mouth daily.    Marland Kitchen diltiazem 2 % GEL Apply 1 application topically 2 (two) times daily. 30 g 3  . estradiol (ESTRACE) 0.1 MG/GM vaginal cream Place 2 g vaginally as needed.      Marland Kitchen  hydrochlorothiazide (MICROZIDE) 12.5 MG capsule 1 Capsule on Monday, Wednesday, AND Friday ONLY 30 capsule 5  . levothyroxine (SYNTHROID, LEVOTHROID) 25 MCG tablet Take 25 mcg by mouth daily before breakfast.   6  . LOTEMAX 0.5 % ophthalmic suspension Place 1 drop into both eyes 4 (four) times daily.     . Multiple Vitamin (MULTIVITAMIN) tablet Take 1 tablet by mouth daily. Takes occ.    . rosuvastatin (CRESTOR) 10 MG tablet Take 10 mg by mouth as directed. 1/4 tablet every other day    . trimethoprim (TRIMPEX) 100 MG tablet Take 100 mg by mouth as directed.    . valsartan (DIOVAN) 320 MG tablet Take 320 mg by mouth daily.   2   No current facility-administered medications for  this visit.    Allergies:   Sulfa drugs cross reactors; Zocor; and Micardis    Social History:  The patient  reports that she has never smoked. She does not have any smokeless tobacco history on file. She reports that she drinks alcohol. She reports that she does not use illicit drugs.   Family History:  The patient's family history includes Alzheimer's disease in her mother; Heart attack in her mother.    ROS:  Please see the history of present illness.   Otherwise, review of systems are positive for none.   All other systems are reviewed and negative.    PHYSICAL EXAM: VS:  BP 198/100 mmHg  Pulse 86  Ht 5\' 1"  (1.549 m)  Wt 117 lb 6.4 oz (53.252 kg)  BMI 22.19 kg/m2 , BMI Body mass index is 22.19 kg/(m^2). GEN: Well nourished, well developed, in no acute distress HEENT: normal Neck: no JVD, carotid bruits, or masses Cardiac: RRR;PVCs in trigeminy no murmurs, rubs, or gallops,no edema . Soft apical click Respiratory:  clear to auscultation bilaterally, normal work of breathing GI: soft, nontender, nondistended, + BS MS: no deformity or atrophy Skin: warm and dry, no rash Neuro:  Strength and sensation are intact Psych: euthymic mood, full affect   EKG:  EKG is ordered today. The ekg ordered today demonstrates NSR with PVCs   Recent Labs: No results found for requested labs within last 365 days.    Lipid Panel    Component Value Date/Time   CHOL 331* 04/14/2013 0921   CHOL 298* 12/14/2011 0919   TRIG 109 04/14/2013 0921   HDL 65 04/14/2013 0921   HDL 67.70 12/14/2011 0919   CHOLHDL 5.1* 04/14/2013 0921   CHOLHDL 4 12/14/2011 0919   VLDL 14.0 12/14/2011 0919   LDLCALC 244* 04/14/2013 0921   LDLDIRECT 224.6 12/14/2011 0919      Wt Readings from Last 3 Encounters:  10/04/14 117 lb 6.4 oz (53.252 kg)  01/11/14 115 lb (52.164 kg)  10/09/13 115 lb (52.164 kg)        ASSESSMENT AND PLAN:  1. Benign hypertensive heart disease without heart failure 2. Mitral  regurgitation 3. Anxiety 4. heart palpitations   Current medicines are reviewed at length with the patient today.  The patient does not have concerns regarding medicines.  The following changes have been made:  Restart HCTZ 12.5 mg BID  Labs/ tests ordered today include: EKG   Orders Placed This Encounter  Procedures  . EKG 12-Lead     Disposition:   FU with Dr. Mare Ferrari in 6 months OV EKG   Signed, Darlin Coco, MD  10/04/2014 5:42 PM    [facility] [place],  [place]  [address] Phone: [phone]; Fax: [phone]

## 2014-10-10 ENCOUNTER — Other Ambulatory Visit: Payer: Self-pay | Admitting: Endocrinology

## 2014-10-10 DIAGNOSIS — E049 Nontoxic goiter, unspecified: Secondary | ICD-10-CM

## 2015-04-09 ENCOUNTER — Other Ambulatory Visit: Payer: PPO

## 2015-04-10 ENCOUNTER — Encounter: Payer: Self-pay | Admitting: Cardiology

## 2015-04-10 ENCOUNTER — Ambulatory Visit (INDEPENDENT_AMBULATORY_CARE_PROVIDER_SITE_OTHER): Payer: PPO | Admitting: Cardiology

## 2015-04-10 VITALS — BP 130/70 | HR 79 | Ht 62.0 in | Wt 116.8 lb

## 2015-04-10 DIAGNOSIS — R002 Palpitations: Secondary | ICD-10-CM | POA: Diagnosis not present

## 2015-04-10 DIAGNOSIS — I493 Ventricular premature depolarization: Secondary | ICD-10-CM

## 2015-04-10 DIAGNOSIS — I119 Hypertensive heart disease without heart failure: Secondary | ICD-10-CM

## 2015-04-10 DIAGNOSIS — I340 Nonrheumatic mitral (valve) insufficiency: Secondary | ICD-10-CM | POA: Diagnosis not present

## 2015-04-10 NOTE — Progress Notes (Signed)
Cardiology Office Note   Date:  04/10/2015   ID:  LAVORA BRISBON, DOB 1940-12-29, MRN [id number]  PCP:  Horatio Pel, MD  Cardiologist: Darlin Coco MD  No chief complaint on file.     History of Present Illness: Stacey Ramirez is a 74 y.o. female who presents for scheduled follow-up visit She has a past history of hyperkinetic heart syndrome with palpitations and labile hypertension. She also has a history of hypercholesterolemia and a history of hypothyroidism. She continues to have a lot of problems with labile hypertension. She is able to tolerate only 2.5 mg of amlodipine. Any higher dose she gets intractable edema.  She is also on valsartan and hydrochlorothiazide.  She brought in her blood pressures which generally have been stable. The patient has a history of mild diabetes. She checks her blood sugar at home and it ranges between 60 and 130. She brought in the results from his recent lipid panel dated 09/27/13 which showed total cholesterol 220 with HDL 81 and LDL 124. Her last echocardiogram was 04/28/13 and showed an ejection fraction of 60-65% with grade 1 diastolic dysfunction. There were no significant valvular abnormalities. She had what was described as a light stroke in September 2014. Left her with some residual right leg weakness. She was evaluated by neurology. She was not hospitalized. In January the patient had a minor fall in her home. She did not suffer any broken bones. Since then her blood pressure has been very labile. She has been experiencing remittent pounding of her heart.  She has been having some leg cramps intermittently. She thinks that she may be having some early short-term memory loss. Past Medical History  Diagnosis Date  . Hypertension     LIABLE  . Hypercholesterolemia   . Hypothyroidism   . Vitamin D deficiency   . Palpitations   . History of dizziness   . LVH (left ventricular hypertrophy)   . Aortic stenosis   . Mitral  regurgitation   . SOB (shortness of breath)   . Difficulty walking   . Cerebrovascular accident (stroke)     Past Surgical History  Procedure Laterality Date  . Cardiovascular stress test  2002    NORMAL  . Transthoracic echocardiogram  2008    SHOWED LEFT VENTRICULAR HYPERTROPHY AND MILD AORTIC STENOSIS     Current Outpatient Prescriptions  Medication Sig Dispense Refill  . ALPRAZolam (XANAX) 0.25 MG tablet Take 0.25 mg by mouth at bedtime as needed for anxiety (anxiety).    Marland Kitchen amLODipine (NORVASC) 2.5 MG tablet Take 2.5 mg by mouth every other day.     Marland Kitchen aspirin 81 MG tablet Take 81 mg by mouth daily.    . Cholecalciferol (VITAMIN D PO) Take 1 tablet by mouth daily. Taking occ.    . Coenzyme Q10 (CO Q-10) 100 MG CAPS Take 1 tablet by mouth daily.    Marland Kitchen diltiazem 2 % GEL Apply 1 application topically 2 (two) times daily. 30 g 3  . estradiol (ESTRACE) 0.1 MG/GM vaginal cream Place 2 g vaginally as needed (dryness).     . hydrochlorothiazide (MICROZIDE) 12.5 MG capsule 1 Capsule on Monday, Wednesday, AND Friday ONLY 30 capsule 5  . levothyroxine (SYNTHROID, LEVOTHROID) 25 MCG tablet Take 25 mcg by mouth daily before breakfast.   6  . LOTEMAX 0.5 % ophthalmic suspension Place 1 drop into both eyes 4 (four) times daily.     . Multiple Vitamin (MULTIVITAMIN) tablet Take 1 tablet by  mouth daily. Takes occ.    . rosuvastatin (CRESTOR) 10 MG tablet Take 10 mg by mouth as directed. 1/4 tablet every other day    . trimethoprim (TRIMPEX) 100 MG tablet Take 100 mg by mouth as directed.    . valsartan (DIOVAN) 320 MG tablet Take 320 mg by mouth daily.   2   No current facility-administered medications for this visit.    Allergies:   Sulfa drugs cross reactors; Zocor; and Micardis    Social History:  The patient  reports that she has never smoked. She does not have any smokeless tobacco history on file. She reports that she drinks alcohol. She reports that she does not use illicit drugs.    Family History:  The patient's family history includes Alzheimer's disease in her mother; Heart attack in her mother.    ROS:  Please see the history of present illness.   Otherwise, review of systems are positive for none.   All other systems are reviewed and negative.    PHYSICAL EXAM: VS:  BP 130/70 mmHg  Pulse 79  Ht 5\' 2"  (1.575 m)  Wt 116 lb 12.8 oz (52.98 kg)  BMI 21.36 kg/m2 , BMI Body mass index is 21.36 kg/(m^2). GEN: Well nourished, well developed, in no acute distress HEENT: normal Neck: no JVD, carotid bruits, or masses Cardiac: RRR; there is a grade 1/6 systolic ejection murmur at the base.  No diastolic murmur.  No gallop or rub. Respiratory:  clear to auscultation bilaterally, normal work of breathing GI: soft, nontender, nondistended, + BS MS: no deformity or atrophy Skin: warm and dry, no rash Neuro:  Strength and sensation are intact Psych: euthymic mood, full affect   EKG:  EKG is not ordered today.    Recent Labs: No results found for requested labs within last 365 days.    Lipid Panel    Component Value Date/Time   CHOL 331* 04/14/2013 0921   CHOL 298* 12/14/2011 0919   TRIG 109 04/14/2013 0921   HDL 65 04/14/2013 0921   HDL 67.70 12/14/2011 0919   CHOLHDL 5.1* 04/14/2013 0921   CHOLHDL 4 12/14/2011 0919   VLDL 14.0 12/14/2011 0919   LDLCALC 244* 04/14/2013 0921   LDLDIRECT 224.6 12/14/2011 0919      Wt Readings from Last 3 Encounters:  04/10/15 116 lb 12.8 oz (52.98 kg)  10/04/14 117 lb 6.4 oz (53.252 kg)  01/11/14 115 lb (52.164 kg)        ASSESSMENT AND PLAN:  1. Benign hypertensive heart disease without heart failure 2. Mitral regurgitation 3. Anxiety 4. heart palpitations   Current medicines are reviewed at length with the patient today.  The patient does not have concerns regarding medicines.  The following changes have been made:  no change  Labs/ tests ordered today include:  No orders of the defined types were  placed in this encounter.     Disposition: The patient will continue current medication.  Recheck here when necessary.  Continue medical follow-up with Dr. Shelia Media  Signed, Darlin Coco MD 04/10/2015 5:24 PM    [address], [place], [place]  [address] Phone: [phone]; Fax: [phone]

## 2015-04-10 NOTE — Patient Instructions (Signed)
Medication Instructions:  .Your physician recommends that you continue on your current medications as directed. Please refer to the Current Medication list given to you today.  Labwork: none  Testing/Procedures: none  Follow-Up: As needed   

## 2015-04-15 ENCOUNTER — Ambulatory Visit
Admission: RE | Admit: 2015-04-15 | Discharge: 2015-04-15 | Disposition: A | Discharge status: Home / Self Care | Payer: PPO | Source: Ambulatory Visit | Attending: Endocrinology | Admitting: Endocrinology

## 2015-04-15 DIAGNOSIS — E049 Nontoxic goiter, unspecified: Secondary | ICD-10-CM

## 2015-04-29 ENCOUNTER — Other Ambulatory Visit: Payer: PPO

## 2015-05-02 ENCOUNTER — Other Ambulatory Visit: Payer: Self-pay | Admitting: *Deleted

## 2015-05-02 DIAGNOSIS — F419 Anxiety disorder, unspecified: Secondary | ICD-10-CM

## 2015-05-02 NOTE — Telephone Encounter (Signed)
Refilled Xanax as requested

## 2015-05-02 NOTE — Telephone Encounter (Signed)
She should probably get this from her PCP.

## 2015-05-03 MED ORDER — ALPRAZOLAM 0.25 MG PO TABS: 0.2500 mg | ORAL_TABLET | Freq: Every day | ORAL | Status: DC | PRN

## 2015-05-03 NOTE — Telephone Encounter (Signed)
Discussed further with  Dr. Mare Ferrari and ok to fill but further refills from PCP

## 2015-05-30 NOTE — Telephone Encounter (Signed)
Error

## 2015-08-07 DIAGNOSIS — H04553 Acquired stenosis of bilateral nasolacrimal duct: Secondary | ICD-10-CM | POA: Diagnosis not present

## 2015-08-07 DIAGNOSIS — H2513 Age-related nuclear cataract, bilateral: Secondary | ICD-10-CM | POA: Diagnosis not present

## 2015-08-07 DIAGNOSIS — H04223 Epiphora due to insufficient drainage, bilateral lacrimal glands: Secondary | ICD-10-CM | POA: Diagnosis not present

## 2015-08-12 DIAGNOSIS — R195 Other fecal abnormalities: Secondary | ICD-10-CM | POA: Diagnosis not present

## 2015-08-22 DIAGNOSIS — K5730 Diverticulosis of large intestine without perforation or abscess without bleeding: Secondary | ICD-10-CM | POA: Diagnosis not present

## 2015-08-22 DIAGNOSIS — R195 Other fecal abnormalities: Secondary | ICD-10-CM | POA: Diagnosis not present

## 2015-08-22 DIAGNOSIS — D126 Benign neoplasm of colon, unspecified: Secondary | ICD-10-CM | POA: Diagnosis not present

## 2015-08-22 DIAGNOSIS — K621 Rectal polyp: Secondary | ICD-10-CM | POA: Diagnosis not present

## 2015-12-13 ENCOUNTER — Inpatient Hospital Stay (HOSPITAL_COMMUNITY)
Admission: EM | Admit: 2015-12-13 | Discharge: 2015-12-17 | DRG: 041 | Disposition: A | Discharge status: Rehabilitation Facility | Payer: PPO | Attending: Internal Medicine | Admitting: Internal Medicine

## 2015-12-13 ENCOUNTER — Emergency Department (HOSPITAL_COMMUNITY): Payer: PPO

## 2015-12-13 ENCOUNTER — Observation Stay (HOSPITAL_BASED_OUTPATIENT_CLINIC_OR_DEPARTMENT_OTHER): Payer: PPO

## 2015-12-13 ENCOUNTER — Encounter (HOSPITAL_COMMUNITY): Payer: Self-pay | Admitting: Emergency Medicine

## 2015-12-13 ENCOUNTER — Observation Stay (HOSPITAL_COMMUNITY): Payer: PPO

## 2015-12-13 DIAGNOSIS — I69354 Hemiplegia and hemiparesis following cerebral infarction affecting left non-dominant side: Secondary | ICD-10-CM | POA: Diagnosis not present

## 2015-12-13 DIAGNOSIS — I471 Supraventricular tachycardia: Secondary | ICD-10-CM | POA: Diagnosis present

## 2015-12-13 DIAGNOSIS — I350 Nonrheumatic aortic (valve) stenosis: Secondary | ICD-10-CM

## 2015-12-13 DIAGNOSIS — G8194 Hemiplegia, unspecified affecting left nondominant side: Secondary | ICD-10-CM | POA: Diagnosis present

## 2015-12-13 DIAGNOSIS — I69351 Hemiplegia and hemiparesis following cerebral infarction affecting right dominant side: Secondary | ICD-10-CM

## 2015-12-13 DIAGNOSIS — E785 Hyperlipidemia, unspecified: Secondary | ICD-10-CM | POA: Diagnosis present

## 2015-12-13 DIAGNOSIS — Z7902 Long term (current) use of antithrombotics/antiplatelets: Secondary | ICD-10-CM | POA: Diagnosis not present

## 2015-12-13 DIAGNOSIS — I63411 Cerebral infarction due to embolism of right middle cerebral artery: Secondary | ICD-10-CM | POA: Diagnosis not present

## 2015-12-13 DIAGNOSIS — I639 Cerebral infarction, unspecified: Secondary | ICD-10-CM

## 2015-12-13 DIAGNOSIS — R2990 Unspecified symptoms and signs involving the nervous system: Secondary | ICD-10-CM

## 2015-12-13 DIAGNOSIS — Z79899 Other long term (current) drug therapy: Secondary | ICD-10-CM | POA: Diagnosis not present

## 2015-12-13 DIAGNOSIS — E7849 Other hyperlipidemia: Secondary | ICD-10-CM | POA: Diagnosis present

## 2015-12-13 DIAGNOSIS — E038 Other specified hypothyroidism: Secondary | ICD-10-CM | POA: Diagnosis not present

## 2015-12-13 DIAGNOSIS — E7800 Pure hypercholesterolemia, unspecified: Secondary | ICD-10-CM | POA: Diagnosis not present

## 2015-12-13 DIAGNOSIS — I638 Other cerebral infarction: Secondary | ICD-10-CM | POA: Diagnosis not present

## 2015-12-13 DIAGNOSIS — E039 Hypothyroidism, unspecified: Secondary | ICD-10-CM | POA: Diagnosis not present

## 2015-12-13 DIAGNOSIS — I69398 Other sequelae of cerebral infarction: Secondary | ICD-10-CM | POA: Diagnosis not present

## 2015-12-13 DIAGNOSIS — R29810 Facial weakness: Secondary | ICD-10-CM | POA: Diagnosis present

## 2015-12-13 DIAGNOSIS — I6930 Unspecified sequelae of cerebral infarction: Secondary | ICD-10-CM | POA: Insufficient documentation

## 2015-12-13 DIAGNOSIS — I340 Nonrheumatic mitral (valve) insufficiency: Secondary | ICD-10-CM | POA: Diagnosis not present

## 2015-12-13 DIAGNOSIS — G44219 Episodic tension-type headache, not intractable: Secondary | ICD-10-CM | POA: Diagnosis not present

## 2015-12-13 DIAGNOSIS — R7303 Prediabetes: Secondary | ICD-10-CM | POA: Diagnosis not present

## 2015-12-13 DIAGNOSIS — I6789 Other cerebrovascular disease: Secondary | ICD-10-CM

## 2015-12-13 DIAGNOSIS — I63431 Cerebral infarction due to embolism of right posterior cerebral artery: Principal | ICD-10-CM | POA: Diagnosis present

## 2015-12-13 DIAGNOSIS — K5900 Constipation, unspecified: Secondary | ICD-10-CM | POA: Diagnosis not present

## 2015-12-13 DIAGNOSIS — I10 Essential (primary) hypertension: Secondary | ICD-10-CM | POA: Diagnosis present

## 2015-12-13 DIAGNOSIS — Z8673 Personal history of transient ischemic attack (TIA), and cerebral infarction without residual deficits: Secondary | ICD-10-CM | POA: Diagnosis not present

## 2015-12-13 DIAGNOSIS — F4322 Adjustment disorder with anxiety: Secondary | ICD-10-CM | POA: Diagnosis not present

## 2015-12-13 DIAGNOSIS — R4781 Slurred speech: Secondary | ICD-10-CM | POA: Diagnosis not present

## 2015-12-13 DIAGNOSIS — I080 Rheumatic disorders of both mitral and aortic valves: Secondary | ICD-10-CM | POA: Diagnosis not present

## 2015-12-13 DIAGNOSIS — R5383 Other fatigue: Secondary | ICD-10-CM | POA: Diagnosis not present

## 2015-12-13 DIAGNOSIS — I6381 Other cerebral infarction due to occlusion or stenosis of small artery: Secondary | ICD-10-CM | POA: Diagnosis present

## 2015-12-13 DIAGNOSIS — I69328 Other speech and language deficits following cerebral infarction: Secondary | ICD-10-CM | POA: Diagnosis not present

## 2015-12-13 DIAGNOSIS — R269 Unspecified abnormalities of gait and mobility: Secondary | ICD-10-CM | POA: Diagnosis not present

## 2015-12-13 DIAGNOSIS — I517 Cardiomegaly: Secondary | ICD-10-CM | POA: Diagnosis present

## 2015-12-13 DIAGNOSIS — I6350 Cerebral infarction due to unspecified occlusion or stenosis of unspecified cerebral artery: Secondary | ICD-10-CM | POA: Diagnosis not present

## 2015-12-13 DIAGNOSIS — R42 Dizziness and giddiness: Secondary | ICD-10-CM | POA: Diagnosis not present

## 2015-12-13 DIAGNOSIS — F419 Anxiety disorder, unspecified: Secondary | ICD-10-CM | POA: Diagnosis not present

## 2015-12-13 DIAGNOSIS — H538 Other visual disturbances: Secondary | ICD-10-CM | POA: Diagnosis not present

## 2015-12-13 DIAGNOSIS — R262 Difficulty in walking, not elsewhere classified: Secondary | ICD-10-CM | POA: Diagnosis not present

## 2015-12-13 DIAGNOSIS — Z7982 Long term (current) use of aspirin: Secondary | ICD-10-CM

## 2015-12-13 HISTORY — DX: Unspecified symptoms and signs involving the nervous system: R29.90

## 2015-12-13 LAB — LIPID PANEL
CHOLESTEROL: 315 mg/dL — AB (ref 0–200)
HDL: 65 mg/dL (ref >40)
LDL Cholesterol: 238 mg/dL — ABNORMAL HIGH (ref 0–99)
TRIGLYCERIDES: 59 mg/dL (ref <150)
Total CHOL/HDL Ratio: 4.8 RATIO
VLDL: 12 mg/dL (ref 0–40)

## 2015-12-13 LAB — I-STAT CHEM 8, ED
BUN: 15 mg/dL (ref 6–20)
CALCIUM ION: 1.02 mmol/L — AB (ref 1.13–1.30)
CREATININE: 0.5 mg/dL (ref 0.44–1.00)
Chloride: 107 mmol/L (ref 101–111)
GLUCOSE: 113 mg/dL — AB (ref 65–99)
HCT: 43 % (ref 36.0–46.0)
HEMOGLOBIN: 14.6 g/dL (ref 12.0–15.0)
Potassium: 3.6 mmol/L (ref 3.5–5.1)
Sodium: 141 mmol/L (ref 135–145)
TCO2: 22 mmol/L (ref 0–100)

## 2015-12-13 LAB — DIFFERENTIAL
BASOS ABS: 0 10*3/uL (ref 0.0–0.1)
Basophils Relative: 0 %
EOS ABS: 0 10*3/uL (ref 0.0–0.7)
Eosinophils Relative: 0 %
LYMPHS ABS: 1.2 10*3/uL (ref 0.7–4.0)
Lymphocytes Relative: 16 %
MONO ABS: 0.6 10*3/uL (ref 0.1–1.0)
MONOS PCT: 8 %
Neutro Abs: 5.7 10*3/uL (ref 1.7–7.7)
Neutrophils Relative %: 76 %

## 2015-12-13 LAB — COMPREHENSIVE METABOLIC PANEL
ALT: 22 U/L (ref 14–54)
AST: 25 U/L (ref 15–41)
Albumin: 4.1 g/dL (ref 3.5–5.0)
Alkaline Phosphatase: 55 U/L (ref 38–126)
Anion gap: 12 (ref 5–15)
BILIRUBIN TOTAL: 0.8 mg/dL (ref 0.3–1.2)
BUN: 13 mg/dL (ref 6–20)
CALCIUM: 9.4 mg/dL (ref 8.9–10.3)
CO2: 23 mmol/L (ref 22–32)
Chloride: 106 mmol/L (ref 101–111)
Creatinine, Ser: 0.63 mg/dL (ref 0.44–1.00)
Glucose, Bld: 119 mg/dL — ABNORMAL HIGH (ref 65–99)
POTASSIUM: 3.7 mmol/L (ref 3.5–5.1)
Sodium: 141 mmol/L (ref 135–145)
Total Protein: 7.1 g/dL (ref 6.5–8.1)

## 2015-12-13 LAB — CBC
HEMATOCRIT: 40.2 % (ref 36.0–46.0)
HEMOGLOBIN: 13.2 g/dL (ref 12.0–15.0)
MCH: 30.6 pg (ref 26.0–34.0)
MCHC: 32.8 g/dL (ref 30.0–36.0)
MCV: 93.1 fL (ref 78.0–100.0)
Platelets: 210 10*3/uL (ref 150–400)
RBC: 4.32 MIL/uL (ref 3.87–5.11)
RDW: 12.7 % (ref 11.5–15.5)
WBC: 7.5 10*3/uL (ref 4.0–10.5)

## 2015-12-13 LAB — URINALYSIS, ROUTINE W REFLEX MICROSCOPIC
BILIRUBIN URINE: NEGATIVE
GLUCOSE, UA: NEGATIVE mg/dL
HGB URINE DIPSTICK: NEGATIVE
KETONES UR: 40 mg/dL — AB
Leukocytes, UA: NEGATIVE
NITRITE: NEGATIVE
PH: 8 (ref 5.0–8.0)
Protein, ur: 30 mg/dL — AB
SPECIFIC GRAVITY, URINE: 1.01 (ref 1.005–1.030)

## 2015-12-13 LAB — TSH: TSH: 0.911 u[IU]/mL (ref 0.350–4.500)

## 2015-12-13 LAB — URINE MICROSCOPIC-ADD ON: RBC / HPF: NONE SEEN RBC/hpf (ref 0–5)

## 2015-12-13 LAB — I-STAT TROPONIN, ED: Troponin i, poc: 0 ng/mL (ref 0.00–0.08)

## 2015-12-13 LAB — ETHANOL

## 2015-12-13 LAB — PROTIME-INR
INR: 1.01 (ref 0.00–1.49)
Prothrombin Time: 13.5 seconds (ref 11.6–15.2)

## 2015-12-13 LAB — APTT: APTT: 22 s — AB (ref 24–37)

## 2015-12-13 LAB — ECHOCARDIOGRAM COMPLETE
HEIGHTINCHES: 62 in
Weight: 1840 oz

## 2015-12-13 MED ORDER — ENOXAPARIN SODIUM 40 MG/0.4ML ~~LOC~~ SOLN
40.0000 mg | SUBCUTANEOUS | Status: DC
  Administered 2015-12-13 – 2015-12-17 (×5): 40 mg via SUBCUTANEOUS
  Filled 2015-12-13 (×5): qty 0.4

## 2015-12-13 MED ORDER — ASPIRIN 81 MG PO TABS: 81.0000 mg | ORAL_TABLET | Freq: Every day | ORAL | Status: DC

## 2015-12-13 MED ORDER — ALPRAZOLAM 0.25 MG PO TABS
0.2500 mg | ORAL_TABLET | Freq: Every day | ORAL | Status: DC | PRN
  Administered 2015-12-14 – 2015-12-16 (×2): 0.25 mg via ORAL
  Filled 2015-12-13 (×2): qty 1

## 2015-12-13 MED ORDER — LEVOTHYROXINE SODIUM 25 MCG PO TABS
25.0000 ug | ORAL_TABLET | Freq: Every day | ORAL | Status: DC
  Administered 2015-12-14 – 2015-12-17 (×4): 25 ug via ORAL
  Filled 2015-12-13 (×4): qty 1

## 2015-12-13 MED ORDER — ROSUVASTATIN CALCIUM 5 MG PO TABS
2.5000 mg | ORAL_TABLET | ORAL | Status: DC
  Administered 2015-12-14: 2.5 mg via ORAL
  Filled 2015-12-13: qty 0.5

## 2015-12-13 MED ORDER — STROKE: EARLY STAGES OF RECOVERY BOOK
Freq: Once | Status: DC
  Filled 2015-12-13: qty 1

## 2015-12-13 MED ORDER — LOTEPREDNOL ETABONATE 0.5 % OP SUSP
1.0000 [drp] | Freq: Four times a day (QID) | OPHTHALMIC | Status: DC
  Administered 2015-12-13 – 2015-12-17 (×8): 1 [drp] via OPHTHALMIC
  Filled 2015-12-13: qty 5

## 2015-12-13 MED ORDER — ESTRADIOL 0.1 MG/GM VA CREA: 2.0000 g | TOPICAL_CREAM | VAGINAL | Status: DC | PRN

## 2015-12-13 MED ORDER — SENNOSIDES-DOCUSATE SODIUM 8.6-50 MG PO TABS: 1.0000 | ORAL_TABLET | Freq: Every evening | ORAL | Status: DC | PRN

## 2015-12-13 MED ORDER — TRIMETHOPRIM 100 MG PO TABS: 100.0000 mg | ORAL_TABLET | Freq: Every day | ORAL | Status: DC | PRN

## 2015-12-13 MED ORDER — ASPIRIN 325 MG PO TABS
325.0000 mg | ORAL_TABLET | Freq: Every day | ORAL | Status: DC
  Administered 2015-12-13 – 2015-12-15 (×3): 325 mg via ORAL
  Filled 2015-12-13 (×3): qty 1

## 2015-12-13 NOTE — Progress Notes (Signed)
Pt arrived to unit from ED. Transferred to floor bed. Oriented to room and call light. Pt states no pain. Does not request food or drink at this time.  NT to place tele and start q2 vital signs until 0400.  Notified lab of need for fasting lipid panel and hgba1c.  Will continue to monitor. Wendee Copp

## 2015-12-13 NOTE — ED Notes (Signed)
Pt taken for CT 

## 2015-12-13 NOTE — Progress Notes (Signed)
VASCULAR LAB PRELIMINARY  PRELIMINARY  PRELIMINARY  PRELIMINARY  Carotid duplex  completed.    Preliminary report:  Bilateral:  1-39% ICA stenosis.  Vertebral artery flow is antegrade.      Era Parr, RVT 12/13/2015, 11:51 AM

## 2015-12-13 NOTE — Progress Notes (Signed)
*  PRELIMINARY RESULTS* Echocardiogram 2D Echocardiogram has been performed.  Leavy Cella 12/13/2015, 3:11 PM

## 2015-12-13 NOTE — H&P (Signed)
[facility] History and Physical  Stacey Ramirez [id number] DOB: 04-18-1941 DOA: 12/13/2015  Referring physician: Alvino Chapel PCP: Horatio Pel, MD   Chief Complaint: stroke like symptoms  HPI: Stacey Ramirez is a 75 y.o. female with a past medical history that includes CVA, hypothyroidism, hypercholesterolemia, hypertension presents to emergency department with a chief complaint of stroke like symptoms. Initial evaluation concerning for stroke.  Information is obtained from the patient and the husband who is at the bedside. Patient complains of generalized weakness, facial droop and difficulty walking. During the night she needed assistance ambulating to the bathroom which is not her norm. This morning she awakened slight headache and 3 episodes of emesis. She denies any coffee ground emesis or bright red emesis. Addition she experienced some urinary frequency over the last several days. Otherwise she denies headache visual disturbances numbness tingling of extremities. She denies fever chills cough chest pain shortness of breath. Patient reports symptoms began yesterday before going to sleep.  In the emergency department she is afebrile hemodynamically stable and not hypoxic. Was given the bedside swallow eval which she passed.   Review of Systems:  10 point review of systems complete and all systems are negative except as indicated in the history of present illness.   Past Medical History  Diagnosis Date  . Hypertension     LIABLE  . Hypercholesterolemia   . Hypothyroidism   . Vitamin D deficiency   . Palpitations   . History of dizziness   . LVH (left ventricular hypertrophy)   . Aortic stenosis   . Mitral regurgitation   . SOB (shortness of breath)   . Difficulty walking   . Cerebrovascular accident (stroke) [facility])    Past Surgical History  Procedure Laterality Date  . Cardiovascular stress test  2002    NORMAL  . Transthoracic echocardiogram  2008    SHOWED  LEFT VENTRICULAR HYPERTROPHY AND MILD AORTIC STENOSIS   Social History:  reports that she has never smoked. She does not have any smokeless tobacco history on file. She reports that she drinks alcohol. She reports that she does not use illicit drugs. 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 She does walk with a slight limp on the right but does not use a cane or walker. She denies any recent falls. She is independent with ADLs Allergies  Allergen Reactions  . Sulfa Drugs Cross Reactors Itching  . Zocor [Simvastatin] Other (See Comments)    Feels like she is going to pass out, weakness  . Micardis [Telmisartan] Other (See Comments)    Feels like she is going to pass out    Family History  Problem Relation Age of Onset  . Heart attack Mother   . Alzheimer's disease Mother    She is an only child  Prior to Admission medications   Medication Sig Start Date End Date Taking? Authorizing Provider  ALPRAZolam (XANAX) 0.25 MG tablet Take 1 tablet (0.25 mg total) by mouth daily as needed for anxiety (anxiety). 05/03/15  Yes Darlin Coco, MD  amLODipine (NORVASC) 2.5 MG tablet Take 2.5 mg by mouth every evening.  10/04/13  Yes Historical Provider, MD  aspirin 81 MG tablet Take 81 mg by mouth daily.   Yes Historical Provider, MD  estradiol (ESTRACE) 0.1 MG/GM vaginal cream Place 2 g vaginally as needed (dryness).    Yes Historical Provider, MD  levothyroxine (SYNTHROID, LEVOTHROID) 25 MCG tablet Take 25 mcg by mouth daily before breakfast.  09/21/14  Yes Historical Provider, MD  LOTEMAX 0.5 % ophthalmic suspension Place 1 drop into both eyes 4 (four) times daily.  02/17/13  Yes Historical Provider, MD  rosuvastatin (CRESTOR) 10 MG tablet Take 10 mg by mouth as directed. 1/4 tablet every other day   Yes Historical Provider, MD  trimethoprim (TRIMPEX) 100 MG tablet Take 100 mg by mouth daily as needed (infection).  06/02/11  Yes Historical Provider, MD  valsartan (DIOVAN)  320 MG tablet Take 320 mg by mouth daily.  09/26/14  Yes Historical Provider, MD  diltiazem 2 % GEL Apply 1 application topically 2 (two) times daily. Patient not taking: Reported on 12/13/2015 12/15/11   Darlin Coco, MD  hydrochlorothiazide (MICROZIDE) 12.5 MG capsule 1 Capsule on Monday, Wednesday, AND Friday ONLY Patient not taking: Reported on 12/13/2015 10/04/14   Darlin Coco, MD   Physical Exam: Filed Vitals:   12/13/15 0919 12/13/15 0930 12/13/15 0937 12/13/15 1040  BP: 161/70 163/69  163/68  Pulse: 88 89    Temp: 97.5 F (36.4 C)  97.9 F (36.6 C)   TempSrc: Oral     Resp: 18 20    Height: 5\' 2"  (1.575 m)     Weight: 52.164 kg (115 lb)     SpO2: 100% 99%      Wt Readings from Last 3 Encounters:  12/13/15 52.164 kg (115 lb)  04/10/15 52.98 kg (116 lb 12.8 oz)  10/04/14 53.252 kg (117 lb 6.4 oz)    General:  Appears sLightly lethargic somewhat pale but comfortable Eyes: PERRL, normal lids, irises & conjunctiva ENT: grossly normal hearing, lips & tongue, his membranes of her mouth are dry and pink Neck: no LAD, masses or thyromegaly  Cardiovascular: RRR, + murmur. No LE edema. Pedal pulses present and palpable  Respiratory: CTA bilaterally, no w/r/r. Normal respiratory effort. Abdomen: soft, ntnd, is a bowel sounds throughout no guarding or rebounding Skin: no rash or induration seen on limited exam Musculoskeletal: grossly normal tone BUE/BLE, lites without swelling/erythema Psychiatric: grossly normal mood and affect, speech fluent and appropriate Neurologic: Cranial nerves II through XII intact, tongue/uvula midline, all extremities muscle strength 5/5, sensation intact throughout, finger nose finger bilateral within normal limits, quick finger touch bilateral within normal limits, Patient is slow and slightly slurred. Left-sided facial droop.  left grip 4 out of 5 right grip 5 out of 5 lower extremity strength 5 out of 5 bilaterally            Labs on  Admission:  Basic Metabolic Panel:  Recent Labs Lab 12/13/15 0927 12/13/15 0936  NA 141 141  K 3.7 3.6  CL 106 107  CO2 23  --   GLUCOSE 119* 113*  BUN 13 15  CREATININE 0.63 0.50  CALCIUM 9.4  --    Liver Function Tests:  Recent Labs Lab 12/13/15 0927  AST 25  ALT 22  ALKPHOS 55  BILITOT 0.8  PROT 7.1  ALBUMIN 4.1   No results for input(s): LIPASE, AMYLASE in the last 168 hours. No results for input(s): AMMONIA in the last 168 hours. CBC:  Recent Labs Lab 12/13/15 0927 12/13/15 0936  WBC 7.5  --   NEUTROABS 5.7  --   HGB 13.2 14.6  HCT 40.2 43.0  MCV 93.1  --   PLT 210  --    Cardiac Enzymes: No results for input(s): CKTOTAL, CKMB, CKMBINDEX, TROPONINI in the last 168 hours.  BNP (last 3 results) No results for input(s): BNP in  the last 8760 hours.  ProBNP (last 3 results) No results for input(s): PROBNP in the last 8760 hours.  CBG: No results for input(s): GLUCAP in the last 168 hours.  Radiological Exams on Admission: Dg Chest 2 View  12/13/2015  CLINICAL DATA:  Dizziness and weakness.  Hypertension. EXAM: CHEST  2 VIEW COMPARISON:  None. FINDINGS: There is no edema or consolidation. Heart size and pulmonary vascularity are normal. There is atherosclerotic calcification in the aorta. No adenopathy. No bone lesions. IMPRESSION: No edema or consolidation. Electronically Signed   By: Lowella Grip III M.D.   On: 12/13/2015 10:43   Ct Head Wo Contrast  12/13/2015  CLINICAL DATA:  Onset of inability to walk this morning. Initial encounter. No known injury. EXAM: CT HEAD WITHOUT CONTRAST TECHNIQUE: Contiguous axial images were obtained from the base of the skull through the vertex without intravenous contrast. COMPARISON:  None. FINDINGS: There is cortical atrophy and chronic microvascular ischemic change. No evidence of acute intracranial abnormality including hemorrhage, infarct, mass lesion, mass effect or abnormal extra-axial fluid collection is  identified. No hydrocephalus or pneumocephalus. The calvarium is intact. Imaged paranasal sinuses and mastoid air cells are clear. IMPRESSION: No acute abnormality. Atrophy and chronic microvascular ischemic change. Electronically Signed   By: Inge Rise M.D.   On: 12/13/2015 10:41    EKG: Independently reviewed. Sinus rhythmLeft atrial enlargement Borderline right axis deviation Minimal ST depression, lateral leads  Assessment/Plan Principal Problem:   Stroke-like symptom Active Problems:   Hypertension   Hypercholesterolemia   Hypothyroidism   LVH (left ventricular hypertrophy)   Aortic stenosis  #1. Stroke like symptoms specifically slurred speech difficulty walking facial droop. Patient with history of ischemic stroke 3 years ago. Risk factors include hyperlipidemia, hypertension as well as a family medical history of stroke hypertension and CAD. CT of head reveals no acute abnormality. Exam concerning for acute stroke. -Admit to telemetry -Obtain MRI/MRA of the brain -Carotid Dopplers and 2-D echo -PT and OT -She passed bedside swallow eval so we'll provide heart healthy diet -Lipid panel and hemoglobin A1c -Aspirin and statin -We'll hold her home amlodipine and Diovan and hydrochlorothiazide -Neuro consult requested by ED MD  #2. Hypertension. Blood pressure controlled in the emergency department - we'll hold her home medications are now -Dr. closely and resume as indicated  3. History of LVH. Echo done in 2014 reveals an EF of 65% with grade 1 diastolic dysfunction. -Echo as noted above -Intake and output -Daily weights  #4. Hypothyroidism. -We'll obtain a TSH -Continue home Synthroid   Neuro called per ED MD  Code Status: full DVT Prophylaxis: Family Communication: husband at bedside Disposition Plan: home hopefully tomorrow  Time spent: 59  [facility]  Examined patient and discussed the assessment and plan with NP [facility]  and agree with above plan. Care during the described time interval was provided by me .  I have reviewed this patient's available data, including medical history, events of note, physical examination, and all test results as part of my evaluation. I have personally reviewed and interpreted all radiology studies.

## 2015-12-13 NOTE — ED Notes (Signed)
Pt presents from home via [facility] with c/o stroke-like symptoms that began last night before bed. Presents today with L side face droop. Pt reports lightheadedness and vomitting x 2 before sleep. Has hx of CVA with L side toe numbness 2 years ago, no residual. A&Ox4, husband at bedside

## 2015-12-13 NOTE — ED Notes (Signed)
Echo called, pt will go from MRI to Echo after test done.

## 2015-12-13 NOTE — ED Provider Notes (Signed)
CSN: [id number]     Arrival date & time 12/13/15  0907 History   First MD Initiated Contact with Patient 12/13/15 [phone]     Chief Complaint  Patient presents with  . Stroke Symptoms      HPI Patient presents with facial droop weakness and difficulty walking. Began yesterday evening before she went to bed. States she also has had slight headache and has vomited 3 times. States she's also had some urinary frequency over the last few days. No fevers or chills. No vision changes. She's had a previous stroke with some chronic right-sided deficits. States she normally does get up and walk by herself. States last night she was unable to do that.   Past Medical History  Diagnosis Date  . Hypertension     LIABLE  . Hypercholesterolemia   . Hypothyroidism   . Vitamin D deficiency   . Palpitations   . History of dizziness   . LVH (left ventricular hypertrophy)   . Aortic stenosis   . Mitral regurgitation   . SOB (shortness of breath)   . Difficulty walking   . Cerebrovascular accident (stroke) [facility])    Past Surgical History  Procedure Laterality Date  . Cardiovascular stress test  2002    NORMAL  . Transthoracic echocardiogram  2008    SHOWED LEFT VENTRICULAR HYPERTROPHY AND MILD AORTIC STENOSIS   Family History  Problem Relation Age of Onset  . Heart attack Mother   . Alzheimer's disease Mother    Social History  Substance Use Topics  . Smoking status: Never Smoker   . Smokeless tobacco: None  . Alcohol Use: Yes   OB History    No data available     Review of Systems  Constitutional: Positive for fatigue. Negative for appetite change.  Respiratory: Negative for shortness of breath.   Cardiovascular: Negative for chest pain and leg swelling.  Gastrointestinal: Positive for nausea and vomiting.  Genitourinary: Positive for frequency.  Musculoskeletal: Negative for back pain.  Skin: Negative for wound.  Neurological: Positive for weakness and headaches.       Allergies  Sulfa drugs cross reactors; Zocor; and Micardis  Home Medications   Prior to Admission medications   Medication Sig Start Date End Date Taking? Authorizing Provider  ALPRAZolam (XANAX) 0.25 MG tablet Take 1 tablet (0.25 mg total) by mouth daily as needed for anxiety (anxiety). 05/03/15  Yes Darlin Coco, MD  amLODipine (NORVASC) 2.5 MG tablet Take 2.5 mg by mouth every evening.  10/04/13  Yes Historical Provider, MD  aspirin 81 MG tablet Take 81 mg by mouth daily.   Yes Historical Provider, MD  estradiol (ESTRACE) 0.1 MG/GM vaginal cream Place 2 g vaginally as needed (dryness).    Yes Historical Provider, MD  levothyroxine (SYNTHROID, LEVOTHROID) 25 MCG tablet Take 25 mcg by mouth daily before breakfast.  09/21/14  Yes Historical Provider, MD  LOTEMAX 0.5 % ophthalmic suspension Place 1 drop into both eyes 4 (four) times daily.  02/17/13  Yes Historical Provider, MD  rosuvastatin (CRESTOR) 10 MG tablet Take 10 mg by mouth as directed. 1/4 tablet every other day   Yes Historical Provider, MD  trimethoprim (TRIMPEX) 100 MG tablet Take 100 mg by mouth daily as needed (infection).  06/02/11  Yes Historical Provider, MD  valsartan (DIOVAN) 320 MG tablet Take 320 mg by mouth daily.  09/26/14  Yes Historical Provider, MD  diltiazem 2 % GEL Apply 1 application topically 2 (two) times daily. Patient not taking: Reported  on 12/13/2015 12/15/11   Darlin Coco, MD  hydrochlorothiazide (MICROZIDE) 12.5 MG capsule 1 Capsule on Monday, Wednesday, AND Friday ONLY Patient not taking: Reported on 12/13/2015 10/04/14   Darlin Coco, MD   BP 169/72 mmHg  Pulse 76  Temp(Src) 97.9 F (36.6 C) (Oral)  Resp 22  Ht 5\' 2"  (1.575 m)  Wt 115 lb (52.164 kg)  BMI 21.03 kg/m2  SpO2 99% Physical Exam  Constitutional: She appears well-developed.  HENT:  Head: Atraumatic.  Eyes: EOM are normal.  Neck: Neck supple.  Cardiovascular: Normal rate.   Pulmonary/Chest: Effort normal.  Abdominal:  Soft. There is no tenderness.  Neurological:  Patient is awake and appropriate. Left-sided lower facial droop. Appears to have equal eyebrow raise. Extraocular movements intact. Pupils reactive. Tongue when stuck out is midline. Good grip strength bilaterally. Finger-nose is much slower on left side compared to right. Some mild weakness on left straight leg raise and plantar flexion of left foot compared to right. Heel shin intact bilaterally but may be slower on left side.    ED Course  Procedures (including critical care time) Labs Review Labs Reviewed  APTT - Abnormal; Notable for the following:    aPTT 22 (*)    All other components within normal limits  COMPREHENSIVE METABOLIC PANEL - Abnormal; Notable for the following:    Glucose, Bld 119 (*)    All other components within normal limits  URINALYSIS, ROUTINE W REFLEX MICROSCOPIC (NOT AT [facility]) - Abnormal; Notable for the following:    APPearance HAZY (*)    Ketones, ur 40 (*)    Protein, ur 30 (*)    All other components within normal limits  URINE MICROSCOPIC-ADD ON - Abnormal; Notable for the following:    Squamous Epithelial / LPF 0-5 (*)    Bacteria, UA RARE (*)    All other components within normal limits  I-STAT CHEM 8, ED - Abnormal; Notable for the following:    Glucose, Bld 113 (*)    Calcium, Ion 1.02 (*)    All other components within normal limits  ETHANOL  PROTIME-INR  CBC  DIFFERENTIAL  URINE RAPID DRUG SCREEN, HOSP PERFORMED  HEMOGLOBIN A1C  LIPID PANEL  TSH  I-STAT TROPOININ, ED    Imaging Review Dg Chest 2 View  12/13/2015  CLINICAL DATA:  Dizziness and weakness.  Hypertension. EXAM: CHEST  2 VIEW COMPARISON:  None. FINDINGS: There is no edema or consolidation. Heart size and pulmonary vascularity are normal. There is atherosclerotic calcification in the aorta. No adenopathy. No bone lesions. IMPRESSION: No edema or consolidation. Electronically Signed   By: Lowella Grip III M.D.   On: 12/13/2015  10:43   Ct Head Wo Contrast  12/13/2015  CLINICAL DATA:  Onset of inability to walk this morning. Initial encounter. No known injury. EXAM: CT HEAD WITHOUT CONTRAST TECHNIQUE: Contiguous axial images were obtained from the base of the skull through the vertex without intravenous contrast. COMPARISON:  None. FINDINGS: There is cortical atrophy and chronic microvascular ischemic change. No evidence of acute intracranial abnormality including hemorrhage, infarct, mass lesion, mass effect or abnormal extra-axial fluid collection is identified. No hydrocephalus or pneumocephalus. The calvarium is intact. Imaged paranasal sinuses and mastoid air cells are clear. IMPRESSION: No acute abnormality. Atrophy and chronic microvascular ischemic change. Electronically Signed   By: Inge Rise M.D.   On: 12/13/2015 10:41   Mr Brain Wo Contrast  12/13/2015  CLINICAL DATA:  75 year old female with slurred speech, difficulty walking and  facial droop symptom onset today. Initial encounter. EXAM: MRI HEAD WITHOUT CONTRAST MRA HEAD WITHOUT CONTRAST TECHNIQUE: Multiplanar, multiecho pulse sequences of the brain and surrounding structures were obtained without intravenous contrast. Angiographic images of the head were obtained using MRA technique without contrast. COMPARISON:  Head CT without contrast 0955 hours today. Head and neck MRA a 04/19/2013. FINDINGS: MRI HEAD FINDINGS Major intracranial vascular flow voids are preserved. Confluent 3.5 cm area of restricted diffusion in the right basal ganglia. Minimal to mild associated T2 and FLAIR hyperintensity. No associated hemorrhage. Mild regional mass effect, no significant intracranial mass effect. There is a superimposed punctate area of periventricular white matter restricted diffusion at the atrium on the right (series 8, image 11). No contralateral or posterior fossa restricted diffusion. Superimposed small chronic lacunar infarcts in both cerebellar hemispheres an the  thalamus. Increased perivascular spaces throughout the basal ganglia. Patchy mostly posterior hemisphere nonspecific cerebral white matter T2 and FLAIR hyperintensity. Some of these white matter changes ppm most resemble chronic lacunar infarcts. No supratentorial cortical encephalomalacia identified. There are scattered chronic micro hemorrhages in the temporal lobes and cerebellum. No midline shift, evidence of mass lesion, ventriculomegaly, extra-axial collection or acute intracranial hemorrhage. Cervicomedullary junction and pituitary are within normal limits. Negative visualized cervical spine. Mildly prominent internal auditory canals with otherwise negative visualized internal auditory structures. Mastoids are clear. Paranasal sinuses are clear. Negative orbit and scalp soft tissues. Visualized bone marrow signal is within normal limits. MRA HEAD FINDINGS Stable antegrade flow in the posterior circulation with fairly codominant distal vertebral arteries. Both PICA origins remain patent. Vertebrobasilar junction remains patent. Mild distal vertebral artery irregularity without stenosis appears stable. Mid basilar artery irregularity with up to mild stenosis has mildly progressed. SCA and PCA origins remain patent. There is mild to moderate stenosis in the right PCA P1 segment which is new. Bilateral PCA branches are stable. Posterior communicating arteries are diminutive or absent. Stable antegrade flow in both ICA siphons and distal cervical ICAs. Bilateral siphon atherosclerosis and irregularity. At least moderate stenosis just distal to the right ICA anterior genu appears stable (series 705, image 6). Normal ophthalmic artery origins. Patent carotid termini. The right MCA origin and M1 segment remain patent. Mild M1 irregularity has not significantly changed. Right MCA bifurcation remains patent. Visualized right MCA branches appear stable with mild distal M2 and M3 branch irregularity. No right MCA branch  occlusion identified. Bilateral ACA and left MCA branches are stable with mild irregularity. IMPRESSION: 1. Confluent acute infarct in the right basal ganglia with no associated hemorrhage or mass effect. Superimposed punctate posterior right MCA periventricular white matter infarct. 2. Negative for emergent large vessel occlusion. Anterior circulation atherosclerosis appears stable since 2014, including at least moderate stenosis of the supraclinoid right ICA (series 705, image 6). 3. Mild progression of posterior circulation atherosclerosis since 2014 including new mild stenosis of the mid basilar artery and moderate stenosis of the right PCA P1 segment. 4. Underlying chronic small vessel ischemia, moderate for age. Electronically Signed   By: Genevie Ann M.D.   On: 12/13/2015 14:33   Mr Jodene Nam Head/brain Wo Cm  12/13/2015  CLINICAL DATA:  75 year old female with slurred speech, difficulty walking and facial droop symptom onset today. Initial encounter. EXAM: MRI HEAD WITHOUT CONTRAST MRA HEAD WITHOUT CONTRAST TECHNIQUE: Multiplanar, multiecho pulse sequences of the brain and surrounding structures were obtained without intravenous contrast. Angiographic images of the head were obtained using MRA technique without contrast. COMPARISON:  Head CT without contrast 0955  hours today. Head and neck MRA a 04/19/2013. FINDINGS: MRI HEAD FINDINGS Major intracranial vascular flow voids are preserved. Confluent 3.5 cm area of restricted diffusion in the right basal ganglia. Minimal to mild associated T2 and FLAIR hyperintensity. No associated hemorrhage. Mild regional mass effect, no significant intracranial mass effect. There is a superimposed punctate area of periventricular white matter restricted diffusion at the atrium on the right (series 8, image 11). No contralateral or posterior fossa restricted diffusion. Superimposed small chronic lacunar infarcts in both cerebellar hemispheres an the thalamus. Increased perivascular  spaces throughout the basal ganglia. Patchy mostly posterior hemisphere nonspecific cerebral white matter T2 and FLAIR hyperintensity. Some of these white matter changes ppm most resemble chronic lacunar infarcts. No supratentorial cortical encephalomalacia identified. There are scattered chronic micro hemorrhages in the temporal lobes and cerebellum. No midline shift, evidence of mass lesion, ventriculomegaly, extra-axial collection or acute intracranial hemorrhage. Cervicomedullary junction and pituitary are within normal limits. Negative visualized cervical spine. Mildly prominent internal auditory canals with otherwise negative visualized internal auditory structures. Mastoids are clear. Paranasal sinuses are clear. Negative orbit and scalp soft tissues. Visualized bone marrow signal is within normal limits. MRA HEAD FINDINGS Stable antegrade flow in the posterior circulation with fairly codominant distal vertebral arteries. Both PICA origins remain patent. Vertebrobasilar junction remains patent. Mild distal vertebral artery irregularity without stenosis appears stable. Mid basilar artery irregularity with up to mild stenosis has mildly progressed. SCA and PCA origins remain patent. There is mild to moderate stenosis in the right PCA P1 segment which is new. Bilateral PCA branches are stable. Posterior communicating arteries are diminutive or absent. Stable antegrade flow in both ICA siphons and distal cervical ICAs. Bilateral siphon atherosclerosis and irregularity. At least moderate stenosis just distal to the right ICA anterior genu appears stable (series 705, image 6). Normal ophthalmic artery origins. Patent carotid termini. The right MCA origin and M1 segment remain patent. Mild M1 irregularity has not significantly changed. Right MCA bifurcation remains patent. Visualized right MCA branches appear stable with mild distal M2 and M3 branch irregularity. No right MCA branch occlusion identified. Bilateral  ACA and left MCA branches are stable with mild irregularity. IMPRESSION: 1. Confluent acute infarct in the right basal ganglia with no associated hemorrhage or mass effect. Superimposed punctate posterior right MCA periventricular white matter infarct. 2. Negative for emergent large vessel occlusion. Anterior circulation atherosclerosis appears stable since 2014, including at least moderate stenosis of the supraclinoid right ICA (series 705, image 6). 3. Mild progression of posterior circulation atherosclerosis since 2014 including new mild stenosis of the mid basilar artery and moderate stenosis of the right PCA P1 segment. 4. Underlying chronic small vessel ischemia, moderate for age. Electronically Signed   By: Genevie Ann M.D.   On: 12/13/2015 14:33   I have personally reviewed and evaluated these images and lab results as part of my medical decision-making.   EKG Interpretation   Date/Time:  Friday Dec 13 2015 09:14:37 EDT Ventricular Rate:  93 PR Interval:  177 QRS Duration: 86 QT Interval:  378 QTC Calculation: 470 R Axis:   82 Text Interpretation:  Sinus rhythm Left atrial enlargement Borderline  right axis deviation Minimal ST depression, lateral leads Confirmed by  Alvino Chapel  MD, Ovid Curd [phone]) on 12/13/2015 9:17:45 AM      MDM   Final diagnoses:  Cerebrovascular accident (CVA) due to occlusion of cerebral artery [facility])    Patient with clinically likely stroke. Head CT reassuring. Not a TPA candidate due  to time of onset last night. Will admit to internal medicine. MRI done after admission did show stroke.      Davonna Belling, MD 12/13/15 [phone]

## 2015-12-13 NOTE — ED Notes (Signed)
Patient transported to CT 

## 2015-12-13 NOTE — Consult Note (Signed)
Neurology Consultation Reason for Consult: Left-sided weakness Referring Physician: Alvino Chapel, N  CC: Left-sided weakness  History is obtained from: Patient, husband  HPI: Stacey Ramirez is a 75 y.o. female who presents with left-sided weakness that started sometime between when she went to bed at 11:30 when she woke up at 2:30. She presented subsequent later to the emergency room and at that point was outside any IV or IA interventional windows and therefore no code stroke was called. She denies numbness.  An MRI was performed in the emergency department which demonstrates right basal ganglia infarct.   LKW: 11:30 PM 5/11 tpa given?: no, outside of window    ROS: A 14 point ROS was performed and is negative except as noted in the HPI.   Past Medical History  Diagnosis Date  . Hypertension     LIABLE  . Hypercholesterolemia   . Hypothyroidism   . Vitamin D deficiency   . Palpitations   . History of dizziness   . LVH (left ventricular hypertrophy)   . Aortic stenosis   . Mitral regurgitation   . SOB (shortness of breath)   . Difficulty walking   . Cerebrovascular accident (stroke) [facility])      Family History  Problem Relation Age of Onset  . Heart attack Mother   . Alzheimer's disease Mother      Social History:  reports that she has never smoked. She does not have any smokeless tobacco history on file. She reports that she drinks alcohol. She reports that she does not use illicit drugs.   Exam: Current vital signs: BP 162/85 mmHg  Pulse 84  Temp(Src) 98 F (36.7 C) (Oral)  Resp 18  Ht 5\' 2"  (1.575 m)  Wt 52.164 kg (115 lb)  BMI 21.03 kg/m2  SpO2 99% Vital signs in last 24 hours: Temp:  [97.5 F (36.4 C)-98 F (36.7 C)] 98 F (36.7 C) (05/12 1600) Pulse Rate:  [72-95] 84 (05/12 1600) Resp:  [15-22] 18 (05/12 1600) BP: (142-173)/(60-86) 162/85 mmHg (05/12 1600) SpO2:  [97 %-100 %] 99 % (05/12 1600) Weight:  [52.164 kg (115 lb)] 52.164 kg (115 lb) (05/12  0919)   Physical Exam  Constitutional: Appears well-developed and well-nourished.  Psych: Affect appropriate to situation Eyes: No scleral injection HENT: No OP obstrucion Head: Normocephalic.  Cardiovascular: Normal rate and regular rhythm.  Respiratory: Effort normal and breath sounds normal to anterior ascultation GI: Soft.  No distension. There is no tenderness.  Skin: WDI  Neuro: Mental Status: Patient is awake, alert, oriented to person, place, month, year, and situation. Patient is able to give a clear and coherent history. No signs of aphasia or neglect She has a mild dysarthria Cranial Nerves: II: Visual Fields are full. Pupils are equal, round, and reactive to light.   III,IV, VI: EOMI without ptosis or diploplia.  V: Facial sensation is symmetric to temperature VII: Facial movement is notable for some left-sided weakness VIII: hearing is intact to voice X: Uvula elevates symmetrically XI: Shoulder shrug is symmetric. XII: tongue is midline without atrophy or fasciculations.  Motor: Tone is normal. Bulk is normal. 5/5 strength was present and right-sided arm and leg. She has mild drift with mild weakness the left arm as well as leg. Sensory: Sensation is symmetric to light touch and temperature in the arms and legs. Cerebellar: No clear ataxia on the right, consistent with weakness on the left         I have reviewed labs in epic  and the results pertinent to this consultation are: CMP-unremarkable  I have reviewed the images obtained: MRI brain-right basal ganglia infarct  Impression: 75 year old female with right basal ganglia infarct. Her exam looks better than I would expect given her imaging. I think that this could be either atherosclerotic disease or embolism and she will need a full workup.  She has not tolerated statins in the past due to weakness.  Recommendations: 1. She has not tolerated statins in the past, I wonder if a lower potency statin  such as pravastatin might be better tolerated. 2. MRI, MRA  of the brain without contrast 3. Frequent neuro checks 4. Echocardiogram 5. Carotid dopplers 6. Prophylactic therapy-Antiplatelet med: Aspirin - dose 325mg  PO or 300mg  PR for now, could consider changing to Plavix prior to admission 7. Risk factor modification 8. Telemetry monitoring 9. PT consult, OT consult, Speech consult 10. please page stroke NP  Or  PA  Or MD  M-F from 8am -4 pm starting 5/13 as this patient will be followed by the stroke team at this point.   You can look them up on [email or web address]      Roland Rack, MD [facility] [phone]  If 7pm- 7am, please page neurology on call as listed in [place].

## 2015-12-14 DIAGNOSIS — I69351 Hemiplegia and hemiparesis following cerebral infarction affecting right dominant side: Secondary | ICD-10-CM | POA: Diagnosis not present

## 2015-12-14 DIAGNOSIS — I6789 Other cerebrovascular disease: Secondary | ICD-10-CM | POA: Diagnosis not present

## 2015-12-14 DIAGNOSIS — R5383 Other fatigue: Secondary | ICD-10-CM | POA: Diagnosis not present

## 2015-12-14 DIAGNOSIS — E7800 Pure hypercholesterolemia, unspecified: Secondary | ICD-10-CM | POA: Diagnosis not present

## 2015-12-14 DIAGNOSIS — R4781 Slurred speech: Secondary | ICD-10-CM | POA: Diagnosis not present

## 2015-12-14 DIAGNOSIS — G8194 Hemiplegia, unspecified affecting left nondominant side: Secondary | ICD-10-CM | POA: Diagnosis not present

## 2015-12-14 DIAGNOSIS — I6381 Other cerebral infarction due to occlusion or stenosis of small artery: Secondary | ICD-10-CM | POA: Diagnosis present

## 2015-12-14 DIAGNOSIS — I6930 Unspecified sequelae of cerebral infarction: Secondary | ICD-10-CM

## 2015-12-14 DIAGNOSIS — I350 Nonrheumatic aortic (valve) stenosis: Secondary | ICD-10-CM | POA: Diagnosis not present

## 2015-12-14 DIAGNOSIS — I340 Nonrheumatic mitral (valve) insufficiency: Secondary | ICD-10-CM | POA: Diagnosis not present

## 2015-12-14 DIAGNOSIS — G44219 Episodic tension-type headache, not intractable: Secondary | ICD-10-CM | POA: Diagnosis not present

## 2015-12-14 DIAGNOSIS — Z7902 Long term (current) use of antithrombotics/antiplatelets: Secondary | ICD-10-CM | POA: Diagnosis not present

## 2015-12-14 DIAGNOSIS — I517 Cardiomegaly: Secondary | ICD-10-CM | POA: Diagnosis not present

## 2015-12-14 DIAGNOSIS — R29810 Facial weakness: Secondary | ICD-10-CM | POA: Diagnosis not present

## 2015-12-14 DIAGNOSIS — I639 Cerebral infarction, unspecified: Secondary | ICD-10-CM | POA: Diagnosis present

## 2015-12-14 DIAGNOSIS — R7303 Prediabetes: Secondary | ICD-10-CM | POA: Diagnosis not present

## 2015-12-14 DIAGNOSIS — I638 Other cerebral infarction: Secondary | ICD-10-CM | POA: Diagnosis not present

## 2015-12-14 DIAGNOSIS — R42 Dizziness and giddiness: Secondary | ICD-10-CM | POA: Diagnosis not present

## 2015-12-14 DIAGNOSIS — I10 Essential (primary) hypertension: Secondary | ICD-10-CM | POA: Diagnosis not present

## 2015-12-14 DIAGNOSIS — Z79899 Other long term (current) drug therapy: Secondary | ICD-10-CM | POA: Diagnosis not present

## 2015-12-14 DIAGNOSIS — I69328 Other speech and language deficits following cerebral infarction: Secondary | ICD-10-CM | POA: Diagnosis not present

## 2015-12-14 DIAGNOSIS — E785 Hyperlipidemia, unspecified: Secondary | ICD-10-CM | POA: Diagnosis not present

## 2015-12-14 DIAGNOSIS — I471 Supraventricular tachycardia, unspecified: Secondary | ICD-10-CM | POA: Diagnosis present

## 2015-12-14 DIAGNOSIS — R269 Unspecified abnormalities of gait and mobility: Secondary | ICD-10-CM | POA: Diagnosis not present

## 2015-12-14 DIAGNOSIS — Z7982 Long term (current) use of aspirin: Secondary | ICD-10-CM | POA: Diagnosis not present

## 2015-12-14 DIAGNOSIS — H538 Other visual disturbances: Secondary | ICD-10-CM | POA: Diagnosis not present

## 2015-12-14 DIAGNOSIS — I080 Rheumatic disorders of both mitral and aortic valves: Secondary | ICD-10-CM | POA: Diagnosis not present

## 2015-12-14 DIAGNOSIS — K5900 Constipation, unspecified: Secondary | ICD-10-CM | POA: Diagnosis not present

## 2015-12-14 DIAGNOSIS — Z8673 Personal history of transient ischemic attack (TIA), and cerebral infarction without residual deficits: Secondary | ICD-10-CM | POA: Diagnosis not present

## 2015-12-14 DIAGNOSIS — E038 Other specified hypothyroidism: Secondary | ICD-10-CM | POA: Diagnosis not present

## 2015-12-14 DIAGNOSIS — F4322 Adjustment disorder with anxiety: Secondary | ICD-10-CM | POA: Diagnosis not present

## 2015-12-14 DIAGNOSIS — I6350 Cerebral infarction due to unspecified occlusion or stenosis of unspecified cerebral artery: Secondary | ICD-10-CM | POA: Diagnosis not present

## 2015-12-14 DIAGNOSIS — R262 Difficulty in walking, not elsewhere classified: Secondary | ICD-10-CM | POA: Diagnosis not present

## 2015-12-14 DIAGNOSIS — F419 Anxiety disorder, unspecified: Secondary | ICD-10-CM | POA: Diagnosis not present

## 2015-12-14 DIAGNOSIS — I63431 Cerebral infarction due to embolism of right posterior cerebral artery: Secondary | ICD-10-CM | POA: Diagnosis not present

## 2015-12-14 DIAGNOSIS — R2990 Unspecified symptoms and signs involving the nervous system: Secondary | ICD-10-CM | POA: Diagnosis not present

## 2015-12-14 DIAGNOSIS — I69354 Hemiplegia and hemiparesis following cerebral infarction affecting left non-dominant side: Secondary | ICD-10-CM | POA: Diagnosis not present

## 2015-12-14 DIAGNOSIS — I69398 Other sequelae of cerebral infarction: Secondary | ICD-10-CM | POA: Diagnosis not present

## 2015-12-14 DIAGNOSIS — I63411 Cerebral infarction due to embolism of right middle cerebral artery: Secondary | ICD-10-CM | POA: Diagnosis not present

## 2015-12-14 DIAGNOSIS — E039 Hypothyroidism, unspecified: Secondary | ICD-10-CM | POA: Diagnosis not present

## 2015-12-14 HISTORY — DX: Unspecified sequelae of cerebral infarction: I69.30

## 2015-12-14 HISTORY — DX: Supraventricular tachycardia: I47.1

## 2015-12-14 HISTORY — DX: Supraventricular tachycardia, unspecified: I47.10

## 2015-12-14 LAB — HEMOGLOBIN A1C
Hgb A1c MFr Bld: 5.7 % — ABNORMAL HIGH (ref 4.8–5.6)
Mean Plasma Glucose: 117 mg/dL

## 2015-12-14 MED ORDER — METOPROLOL TARTRATE 12.5 MG HALF TABLET
12.5000 mg | ORAL_TABLET | Freq: Two times a day (BID) | ORAL | Status: DC
  Administered 2015-12-14 – 2015-12-17 (×6): 12.5 mg via ORAL
  Filled 2015-12-14 (×6): qty 1

## 2015-12-14 MED ORDER — ROSUVASTATIN CALCIUM 10 MG PO TABS
10.0000 mg | ORAL_TABLET | Freq: Every day | ORAL | Status: DC
  Administered 2015-12-14 – 2015-12-17 (×4): 10 mg via ORAL
  Filled 2015-12-14 (×4): qty 1

## 2015-12-14 NOTE — Evaluation (Signed)
Physical Therapy Evaluation Patient Details Name: Stacey Ramirez MRN: [id number] DOB: [birth year] Today's Date: 12/14/2015   History of Present Illness  Patient is a 75 y/o female with hx of HTN, hypothyroidism, Mitral regurgitation, aortic stenosis and CVA presents with left sided weakness, MRI-right basal ganglia infarct, posterior Rt MCA infarct.  Clinical Impression  Patient presents with left facial droop, left sided weakness, balance deficits and slurred speech s/p CVA impacting mobility. Tolerated gait training with Min-Mod A for facilitation of posture, weight-shifting to progress LLE and LLE clearance. Pt highly motivated to return to PLOF. Has supportive husband. Would benefit from CIR to maximize independence and mobility prior to return home. Will follow acutely.     Follow Up Recommendations CIR    Equipment Recommendations  Other (comment) (TBA)    Recommendations for Other Services Rehab consult;OT consult     Precautions / Restrictions Precautions Precautions: Fall Restrictions Weight Bearing Restrictions: No      Mobility  Bed Mobility Overal bed mobility: Needs Assistance Bed Mobility: Supine to Sit     Supine to sit: Min assist;HOB elevated     General bed mobility comments: Increased time. Min A to elevate trunk to get to EOB. left lateral trunk lean.   Transfers Overall transfer level: Needs assistance Equipment used: None Transfers: Sit to/from Stand Sit to Stand: Min assist         General transfer comment: Min A to boost from EOB with some difficulty and cues for technique. Bracing BLEs on bed. UNsteady in standing. transferred to chair post ambulation bout.  Ambulation/Gait Ambulation/Gait assistance: Min assist Ambulation Distance (Feet): 100 Feet Assistive device: 1 person hand held assist (rail for support) Gait Pattern/deviations: Step-to pattern;Step-through pattern;Decreased stride length;Narrow base of support;Decreased dorsiflexion -  left;Decreased weight shift to right Gait velocity: decreased   General Gait Details: Dragging LLE initially- providing cues for right lateral weight shift to progress LLE, verbal cues helped with clearing LLE. Provided cues to facilitate upright trunk as pt leaning left and aware of this.  Stairs            Wheelchair Mobility    Modified Rankin (Stroke Patients Only) Modified Rankin (Stroke Patients Only) Pre-Morbid Rankin Score: No symptoms Modified Rankin: Moderately severe disability     Balance Overall balance assessment: Needs assistance Sitting-balance support: Feet supported;No upper extremity supported Sitting balance-Leahy Scale: Fair Sitting balance - Comments: Able to sit upright with verbal cues but with fatigue or distraction pt with left lateral trunk lean and posterior lean- able to self correct with verbal cues.  Postural control: Posterior lean;Left lateral lean Standing balance support: During functional activity;Single extremity supported Standing balance-Leahy Scale: Poor Standing balance comment: Reliant on UE for support.                              Pertinent Vitals/Pain Pain Assessment: No/denies pain    Home Living Family/patient expects to be discharged to:: Private residence Living Arrangements: Spouse/significant other Available Help at Discharge: Family;Available 24 hours/day Type of Home: House Home Access: Stairs to enter Entrance Stairs-Rails: None Entrance Stairs-Number of Steps: 2 Home Layout: Two level;Able to live on main level with bedroom/bathroom Home Equipment: None      Prior Function Level of Independence: Independent         Comments: Drives, cooks, cleans.     Hand Dominance   Dominant Hand: Right    Extremity/Trunk Assessment   Upper Extremity  Assessment: Defer to OT evaluation           Lower Extremity Assessment: LLE deficits/detail   LLE Deficits / Details: Grossly ~3+-4/5 throughtout  LLE; except 3/5 hip flexion.   Cervical / Trunk Assessment:  (left lateral trunk lean)  Communication   Communication: Expressive difficulties (slurred speech; left facial droop)  Cognition Arousal/Alertness: Awake/alert Behavior During Therapy: WFL for tasks assessed/performed Overall Cognitive Status: Impaired/Different from baseline Area of Impairment: Safety/judgement         Safety/Judgement: Decreased awareness of safety          General Comments General comments (skin integrity, edema, etc.): Spouse present in room.    Exercises        Assessment/Plan    PT Assessment Patient needs continued PT services  PT Diagnosis Difficulty walking;Abnormality of gait;Generalized weakness   PT Problem List Decreased strength;Decreased mobility;Decreased balance;Decreased safety awareness  PT Treatment Interventions Balance training;Gait training;Functional mobility training;Therapeutic activities;Therapeutic exercise;Patient/family education;Stair training;Neuromuscular re-education   PT Goals (Current goals can be found in the Care Plan section) Acute Rehab PT Goals Patient Stated Goal: to return to independence PT Goal Formulation: With patient/family Time For Goal Achievement: 12/28/15 Potential to Achieve Goals: Good    Frequency Min 4X/week   Barriers to discharge Decreased caregiver support;Inaccessible home environment spouse is 47 and can only help so muc physically. steps to get into home    Co-evaluation               End of Session Equipment Utilized During Treatment: Gait belt Activity Tolerance: Patient tolerated treatment well Patient left: in chair;with call bell/phone within reach;with chair alarm set;with family/visitor present Nurse Communication: Mobility status    Functional Assessment Tool Used: clinical judgment Functional Limitation: Mobility: Walking and moving around Mobility: Walking and Moving Around Current Status [id number]): At least  20 percent but less than 40 percent impaired, limited or restricted Mobility: Walking and Moving Around Goal Status [phone]): At least 20 percent but less than 40 percent impaired, limited or restricted    Time: 1134-1207 PT Time Calculation (min) (ACUTE ONLY): 33 min   Charges:   PT Evaluation $PT Eval Moderate Complexity: 1 Procedure PT Treatments $Gait Training: 8-22 mins   PT G Codes:   PT G-Codes **NOT FOR INPATIENT CLASS** Functional Assessment Tool Used: clinical judgment Functional Limitation: Mobility: Walking and moving around Mobility: Walking and Moving Around Current Status [id number]): At least 20 percent but less than 40 percent impaired, limited or restricted Mobility: Walking and Moving Around Goal Status [id number]): At least 20 percent but less than 40 percent impaired, limited or restricted    Jiovany Scheffel A Newell Wafer 12/14/2015, 12:17 PM Wray Kearns, [place], DPT [phone]

## 2015-12-14 NOTE — Progress Notes (Addendum)
STROKE TEAM PROGRESS NOTE   HISTORY OF PRESENT ILLNESS Stacey Ramirez is a 75 y.o. female who presents with left-sided weakness that started sometime between when she went to bed at 11:30 when she woke up at 2:30. She presented subsequent later to the emergency room and at that point was outside any IV or IA interventional windows and therefore no code stroke was called. She denies numbness.  An MRI was performed in the emergency department which demonstrates right basal ganglia infarct.   LKW: 11:30 PM 5/11 tpa given?: no, outside of window   SUBJECTIVE (INTERVAL HISTORY) Husband at bedside, she is intolerant of statins and cannot take anymore, they see Dr. Rexene Alberts outpatient at [facility], awaiting CIR   OBJECTIVE Temp:  [97.5 F (36.4 C)-99.2 F (37.3 C)] 98.6 F (37 C) (05/13 0600) Pulse Rate:  [72-95] 76 (05/13 0600) Cardiac Rhythm:  [-] Normal sinus rhythm (05/13 0600) Resp:  [15-22] 20 (05/13 0205) BP: (127-185)/(60-96) 149/76 mmHg (05/13 0600) SpO2:  [97 %-100 %] 98 % (05/13 0205) Weight:  [52.164 kg (115 lb)] 52.164 kg (115 lb) (05/12 0919)  CBC:  Recent Labs Lab 12/13/15 0927 12/13/15 0936  WBC 7.5  --   NEUTROABS 5.7  --   HGB 13.2 14.6  HCT 40.2 43.0  MCV 93.1  --   PLT 210  --     Basic Metabolic Panel:  Recent Labs Lab 12/13/15 0927 12/13/15 0936  NA 141 141  K 3.7 3.6  CL 106 107  CO2 23  --   GLUCOSE 119* 113*  BUN 13 15  CREATININE 0.63 0.50  CALCIUM 9.4  --     Lipid Panel:    Component Value Date/Time   CHOL 315* 12/13/2015 1558   CHOL 331* 04/14/2013 0921   TRIG 59 12/13/2015 1558   HDL 65 12/13/2015 1558   HDL 65 04/14/2013 0921   CHOLHDL 4.8 12/13/2015 1558   CHOLHDL 5.1* 04/14/2013 0921   VLDL 12 12/13/2015 1558   LDLCALC 238* 12/13/2015 1558   LDLCALC 244* 04/14/2013 0921   HgbA1c:  Lab Results  Component Value Date   HGBA1C 5.7* 12/13/2015   Urine Drug Screen: No results found for: LABOPIA, COCAINSCRNUR, LABBENZ, AMPHETMU, THCU,  LABBARB    IMAGING  Dg Chest 2 View 12/13/2015   No edema or consolidation.    Ct Head Wo Contrast 12/13/2015   No acute abnormality. Atrophy and chronic microvascular ischemic change.     Mr Jodene Nam Head/brain Wo Cm 12/13/2015   1. Confluent acute infarct in the right basal ganglia with no associated hemorrhage or mass effect. Superimposed punctate posterior right MCA periventricular white matter infarct.  2. Negative for emergent large vessel occlusion. Anterior circulation atherosclerosis appears stable since 2014, including at least moderate stenosis of the supraclinoid right ICA (series 705, image 6).  3. Mild progression of posterior circulation atherosclerosis since 2014 including new mild stenosis of the mid basilar artery and moderate stenosis of the right PCA P1 segment.  4. Underlying chronic small vessel ischemia, moderate for age.    PHYSICAL EXAM  Physical exam: Exam: Gen: NAD         CV: RRR, no MRG. No Carotid Bruits. No peripheral edema, warm, nontender Eyes: Conjunctivae clear without exudates or hemorrhage  Neuro: Detailed Neurologic Exam  Speech:    Speech is normal; with normal comprehension.  Cognition:    The patient is oriented to person, place, and time;    Cranial Nerves:    The  pupils are equal, round, and reactive to light. The fundi are normal and spontaneous venous pulsations are present. Visual fields are full to finger confrontation. Extraocular movements are intact. Trigeminal sensation is intact and the muscles of mastication are normal. Lower left sided weakness. The palate elevates in the midline. Hearing intact to voice. Voice is normal. Shoulder shrug is normal. The tongue has normal motion without fasciculations.   Coordination:    No dysmetria, left-sided weakness   Motor Observation:    No asymmetry, no atrophy, and no involuntary movements noted. Tone:    Normal muscle tone.    Posture:    Posture is normal. normal erect     Strength: Left-sided mild weakness of the UE and LE otherwise strength is V/V in the upper and lower limbs.      Sensation: intact to LT     ASSESSMENT/PLAN Ms. Stacey Ramirez is a 74 y.o. female with history of hypertension, hyperlipidemia, hypothyroidism, aortic stenosis, mitral regurgitation, and previous stroke presenting with left hemiparesis.. She did not receive IV t-PA due to late presentation.   Stroke:  Non-dominant infarcts possibly small vessel due to uncontrolled hyperlipidemia  Resultant  Left sided face, arm and leg  MRI  Acute infarct in the right basal ganglia & punctate posterior right MCA periventricular white matter infarct.  MRA  moderate stenosis of the supraclinoid right ICA - moderate stenosis of the right PCA P1 segment.   Carotid Doppler - Vertebral arteries appear patent with antegrade flow. Bilateral carotids 1-39 percent stenosis.  2D Echo  EF 60-65%. No cardiac source of emboli identified.  LDL 238  HgbA1c 5.7  VTE prophylaxis - Lovenox  Diet Heart Room service appropriate?: Yes; Fluid consistency:: Thin  aspirin 81 mg daily prior to admission, now on aspirin 325 mg daily. Change to Plavix 75 daily unless contraindicated.   Patient counseled to be compliant with her antithrombotic medications  Ongoing aggressive stroke risk factor management  Therapy recommendations:  Possible Cir  Disposition:  Possible Cir  Hypertension Permissive hypertension (OK if < 220/120) but gradually normalize in 5-7 days  Hyperlipidemia  Home meds:  Crestor 2.5 mg daily resumed in hospital  LDL 238, goal < 70  Increase Crestor to 10 mg daily if patient agrees, she says she is intolerant. Patient has h/o statin intolerance hence recommend new PCSK9 inhibitor like Praluent  Continue statin at discharge    Other Stroke Risk Factors  Advanced age  ETOH use  Hx stroke/TIA  Other Active Problems    Hospital day #    Personally examined patient and  images, and have participated in and made any corrections needed to history, physical, neuro exam,assessment and plan as stated above.  I have personally obtained the history, evaluated lab date, reviewed imaging studies and agree with radiology interpretations.    Sarina Ill, MD Stroke Neurology [phone] [facility]       To contact Stroke Continuity provider, please refer to [email or web address] After hours, contact General Neurology

## 2015-12-14 NOTE — Progress Notes (Signed)
[facility] Progress Note  Patient: Stacey Ramirez [id number]   PCP: Horatio Pel, MD DOB: 1940/08/07   DOA: 12/13/2015   DOS: 12/14/2015   Date of Service: the patient was seen and examined on 12/14/2015  Subjective: The patient denies having any worsening of her focal deficit. No dizziness no lightheadedness no chest pain and abdominal pain. No nausea no vomiting. No other acute events overnight. Nutrition: Pending speech therapy evaluation and by mouth  Brief hospital course: Patient was admitted on 12/13/2015, with complaint of slurred speech as well as left-sided weakness, was found to have right basal ganglia infarct on MRI. Currently further plan is continue further stroke workup, patient will likely need CIR.  Assessment and Plan: 1. Basal ganglia infarction [facility]) Patient presents with left-sided weakness as well as slurred speech. TPA was not given since outside of the window period. MRA shows moderate stenosis of the right PCA, right supraclinoid ICA Echocardiogram shows 6-65% EF Carotid unremarkable. LDL 238-patient has not been able to tolerate statins in the past. May be a candidate for PCS K9 Hemoglobin A1c 5.7. Patient was an 81 mg aspirin will my be changed to 75 mg Plavix. Was given 325 mg aspirin on admission.  2. Essential hypertension. Permissive hypertension.  3. Recurrent SVT. Telemetry shows the patient has recurrent SVT. We'll start low-dose Lopressor. Maintain K more than 4 magnesium more than 2.  4. Hypothyroidism. Continue Synthroid.  Pain management: When necessary Tylenol Activity: physical therapy recommends CIR Bowel regimen: last BM 12/12/2015 Diet: Cardiac diet DVT Prophylaxis: subcutaneous Heparin  Advance goals of care discussion: Full code  Family Communication: family was present at bedside, at the time of interview. The pt provided permission to discuss medical plan with the family. Opportunity was given to ask question and  all questions were answered satisfactorily.   Disposition:  Discharge to CIR, pending further stroke workup Expected discharge date: 12/17/2015  Consultants: Neurology Procedures: Echocardiogram, carotid Doppler  Antibiotics: Anti-infectives    Start     Dose/Rate Route Frequency Ordered Stop   12/13/15 1106  trimethoprim (TRIMPEX) tablet 100 mg  Status:  Discontinued     100 mg Oral Daily PRN 12/13/15 1109 12/13/15 1145        Intake/Output Summary (Last 24 hours) at 12/14/15 2025 Last data filed at 12/14/15 1400  Gross per 24 hour  Intake    360 ml  Output      0 ml  Net    360 ml   Filed Weights   12/13/15 0919  Weight: 52.164 kg (115 lb)    Objective: Physical Exam: Filed Vitals:   12/14/15 0800 12/14/15 1013 12/14/15 1448 12/14/15 1820  BP: 121/56 160/64 153/66 137/65  Pulse: 65 74 69 74  Temp: 98.3 F (36.8 C) 97.9 F (36.6 C) 98.4 F (36.9 C) 98.4 F (36.9 C)  TempSrc: Axillary Oral Oral Oral  Resp: 18 20 20 20   Height:      Weight:      SpO2: 98% 97% 95% 97%    General: Alert, Awake and Oriented to Time, Place and Person. Appear in moderate distress Eyes: PERRL, Conjunctiva normal ENT: Oral Mucosa clear moist. Neck: difficult to assess  JVD, no Abnormal Mass Or lumps Cardiovascular: S1 and S2 Present, aortic systolic Murmur, Peripheral Pulses Present Respiratory: Bilateral Air entry equal and Decreased, Clear to Auscultation, no Crackles, no wheezes Abdomen: Bowel Sound present, Soft and no tenderness Skin: no redness, no Rash  Extremities: no Pedal edema, no calf  tenderness Neurologic: Left-sided weakness, dysarthria  Data Reviewed: CBC:  Recent Labs Lab 12/13/15 0927 12/13/15 0936  WBC 7.5  --   NEUTROABS 5.7  --   HGB 13.2 14.6  HCT 40.2 43.0  MCV 93.1  --   PLT 210  --    Basic Metabolic Panel:  Recent Labs Lab 12/13/15 0927 12/13/15 0936  NA 141 141  K 3.7 3.6  CL 106 107  CO2 23  --   GLUCOSE 119* 113*  BUN 13 15    CREATININE 0.63 0.50  CALCIUM 9.4  --     Liver Function Tests:  Recent Labs Lab 12/13/15 0927  AST 25  ALT 22  ALKPHOS 55  BILITOT 0.8  PROT 7.1  ALBUMIN 4.1   No results for input(s): LIPASE, AMYLASE in the last 168 hours. No results for input(s): AMMONIA in the last 168 hours. Coagulation Profile:  Recent Labs Lab 12/13/15 0927  INR 1.01   Cardiac Enzymes: No results for input(s): CKTOTAL, CKMB, CKMBINDEX, TROPONINI in the last 168 hours. BNP (last 3 results) No results for input(s): PROBNP in the last 8760 hours.  CBG: No results for input(s): GLUCAP in the last 168 hours.  Studies: No results found.   Scheduled Meds: .  stroke: mapping our early stages of recovery book   Does not apply Once  . aspirin  325 mg Oral Daily  . enoxaparin (LOVENOX) injection  40 mg Subcutaneous Q24H  . levothyroxine  25 mcg Oral QAC breakfast  . loteprednol  1 drop Both Eyes QID  . metoprolol tartrate  12.5 mg Oral BID  . rosuvastatin  10 mg Oral q1800   Continuous Infusions:  PRN Meds: ALPRAZolam, estradiol, senna-docusate  Time spent: 30 minutes  Author: Berle Mull, MD [facility] Pager: [phone] 12/14/2015 8:25 PM  If 7PM-7AM, please contact night-coverage at [email or web address], password [facility]

## 2015-12-14 NOTE — Evaluation (Signed)
Speech Language Pathology Evaluation Patient Details Name: Stacey Ramirez MRN: [id number] DOB: [birth year] Today's Date: 12/14/2015 Time: [id number] SLP Time Calculation (min) (ACUTE ONLY): 17 min  Problem List:  Patient Active Problem List   Diagnosis Date Noted  . Right Basal ganglia infarction ([place]) 12/14/2015  . Stroke ([place]) 12/13/2015  . Stroke-like symptom 12/13/2015  . History of stroke   . Heart palpitations 09/06/2012  . Hypertension   . Hypercholesterolemia   . Hypothyroidism   . Vitamin D deficiency   . Palpitations   . History of dizziness   . LVH (left ventricular hypertrophy)   . Aortic stenosis   . Mitral regurgitation   . SOB (shortness of breath)   . Difficulty walking    Past Medical History:  Past Medical History  Diagnosis Date  . Hypertension     LIABLE  . Hypercholesterolemia   . Hypothyroidism   . Vitamin D deficiency   . Palpitations   . History of dizziness   . LVH (left ventricular hypertrophy)   . Aortic stenosis   . Mitral regurgitation   . SOB (shortness of breath)   . Difficulty walking   . Cerebrovascular accident (stroke) [facility])    Past Surgical History:  Past Surgical History  Procedure Laterality Date  . Cardiovascular stress test  2002    NORMAL  . Transthoracic echocardiogram  2008    SHOWED LEFT VENTRICULAR HYPERTROPHY AND MILD AORTIC STENOSIS   HPI:  Patient is a 75 y.o. female with hx of HTN, hypothyroidism, Mitral regurgitation, hypercholesterolemia, dizziness, SOB, aortic stenosis and CVA presented with left sided weakness, MRI-right basal ganglia infarct, Rt MCA infarct.   Assessment / Plan / Recommendation Clinical Impression  Pt has history of mild memory deficits and pt states her husband "says he has to remind me of things." Daughter is aware that pt needs some help with recall (important dates) intermittently. Speech is mildly dysarthric marked by decreased vocal intensity (dtr reports lower than usual) and minimal  imprecision. She scored in the moderate range on 4 word recall of Cognistat. Pt reports frequently using written information as compensatory strategy. Pt would benefit from skilled ST for facilitation of communication and cognition.      SLP Assessment  Patient needs continued Speech Lanaguage Pathology Services    Follow Up Recommendations  Inpatient Rehab    Frequency and Duration min 2x/week  2 weeks      SLP Evaluation Prior Functioning  Cognitive/Linguistic Baseline: Baseline deficits Baseline deficit details:  (memory) Type of Home: House  Lives With: Spouse Available Help at Discharge: Family;Available 24 hours/day   Cognition  Overall Cognitive Status: Impaired/Different from baseline Arousal/Alertness:  (form memory) Orientation Level: Oriented X4 Attention: Sustained Sustained Attention: Appears intact Memory: Impaired Memory Impairment: Retrieval deficit;Storage deficit;Decreased recall of new information (scored mod impairment on cognistat) Awareness: Appears intact Problem Solving: Appears intact Safety/Judgment: Appears intact    Comprehension  Auditory Comprehension Overall Auditory Comprehension: Appears within functional limits for tasks assessed Visual Recognition/Discrimination Discrimination: Not tested Reading Comprehension Reading Status: Not tested    Expression Expression Primary Mode of Expression: Verbal Verbal Expression Overall Verbal Expression: Appears within functional limits for tasks assessed Initiation: No impairment Level of Generative/Spontaneous Verbalization: Conversation Repetition: No impairment Naming: No impairment Pragmatics: No impairment Written Expression Dominant Hand: Right Written Expression: Not tested   Oral / Motor  Oral Motor/Sensory Function Overall Oral Motor/Sensory Function: Moderate impairment Facial ROM: Suspected CN VII (facial) dysfunction;Reduced left Facial Symmetry: Abnormal symmetry left;Suspected  CN VII (facial) dysfunction Facial Strength: Reduced left;Suspected CN VII (facial) dysfunction Facial Sensation: Within Functional Limits Mandible: Within Functional Limits Motor Speech Overall Motor Speech: Impaired Respiration: Within functional limits Phonation: Low vocal intensity Resonance: Within functional limits Articulation: Within functional limitis Intelligibility: Intelligible (imprecise (min)) Motor Planning: Witnin functional limits   GO          Functional Assessment Tool Used:  (skilled clinical judgement) Functional Limitations: Memory Memory Current Status [id number]): At least 60 percent but less than 80 percent impaired, limited or restricted Memory Goal Status [id number]): At least 40 percent but less than 60 percent impaired, limited or restricted         Houston Siren 12/14/2015, 4:56 PM   Orbie Pyo Colvin Caroli.Ed [employer] [phone]

## 2015-12-14 NOTE — Evaluation (Signed)
Occupational Therapy Evaluation Patient Details Name: Stacey Ramirez MRN: [id number] DOB: 06-13-1941 Today's Date: 12/14/2015    History of Present Illness Patient is a 75 y.o. female with hx of HTN, hypothyroidism, Mitral regurgitation, hypercholesterolemia, dizziness, SOB, aortic stenosis and CVA presented with left sided weakness, MRI-right basal ganglia infarct, Rt MCA infarct.   Clinical Impression   Pt admitted with above. Pt independent with ADLs, PTA. Feel pt will benefit from acute OT to increase independence prior to d/c. Recommending CIR for rehab and feel pt is a great candidate.     Follow Up Recommendations  CIR    Equipment Recommendations  Other (comment) (defer to next venue)    Recommendations for Other Services Rehab consult     Precautions / Restrictions Precautions Precautions: Fall Restrictions Weight Bearing Restrictions: No      Mobility Bed Mobility Overal bed mobility: Needs Assistance Bed Mobility: Supine to Sit;Sit to Supine     Supine to sit: Mod assist Sit to supine: Supervision   General bed mobility comments: assist with trunk to come to sitting position.  Cues given for bed mobility.  Transfers Overall transfer level: Needs assistance Equipment used: None Transfers: Sit to/from Stand Sit to Stand: Min assist            Balance Assist given for ambulation. Decreased sitting balance EOB.                       ADL Overall ADL's : Needs assistance/impaired             Lower Body Bathing: Sit to/from stand;Minimal assistance       Lower Body Dressing: Minimal assistance;Sit to/from stand   Toilet Transfer: Moderate assistance;Ambulation (sit to stand from bed)           Functional mobility during ADLs:  (Mod A-ambulation; Min A-sit to stand) General ADL Comments: explained to compensate for vision in Lt upper quadrant to turn her head that way.      Vision Pt wears glasses Vision Assessment?:  Yes Visual Fields: Other (comment) (difficulty in left upper quadrant)   Perception     Praxis      Pertinent Vitals/Pain Pain Assessment: 0-10 Pain Score: 1  Pain Location: Lt UE with shoulder flexion Pain Descriptors / Indicators: Tightness Pain Intervention(s): Monitored during session     Hand Dominance Right   Extremity/Trunk Assessment Upper Extremity Assessment Upper Extremity Assessment: LUE deficits/detail;RUE deficits/detail RUE Deficits / Details: weakness in Rt shoulder flexors RUE Coordination: decreased fine motor LUE Deficits / Details: weakness in Lt shoulder flexors (weaker than Rt); little less than full AROM shoulder flexion LUE Coordination: decreased fine motor   Lower Extremity Assessment Lower Extremity Assessment: Defer to PT evaluation LLE Deficits / Details: Grossly ~3+-4/5 throughtout LLE; except 3/5 hip flexion.  LLE Sensation:  [facility])    Communication Communication Communication: Expressive difficulties   Cognition Arousal/Alertness: Awake/alert Behavior During Therapy: WFL for tasks assessed/performed Overall Cognitive Status: Within Functional Limits for tasks assessed ((reported she had to think about what she was doing-slow pro) (reported she had to think about what she was doing-slow processing?)                  General Comments       Exercises       Shoulder Instructions      Home Living Family/patient expects to be discharged to:: Inpatient rehab Living Arrangements: Spouse/significant other Available Help at Discharge: Family;Available 24 hours/day  Type of Home: House Home Access: Stairs to enter [employer] of Steps: 2 Entrance Stairs-Rails: None Home Layout: Two level;Able to live on main level with bedroom/bathroom Alternate Level Stairs-Number of Steps: 1 flight Alternate Level Stairs-Rails: Right;Left           Home Equipment: None          Prior Functioning/Environment Level of  Independence: Independent        Comments: Drives, cooks, cleans.    OT Diagnosis: Generalized weakness   OT Problem List: Decreased strength;Decreased range of motion;Pain;Decreased knowledge of use of DME or AE;Decreased coordination;Impaired vision/perception;Impaired balance (sitting and/or standing);Decreased cognition   OT Treatment/Interventions: Patient/family education;Balance training;Visual/perceptual remediation/compensation;Therapeutic activities;DME and/or AE instruction;Therapeutic exercise;Self-care/ADL training;Cognitive remediation/compensation    OT Goals(Current goals can be found in the care plan section) Acute Rehab OT Goals Patient Stated Goal: not stated OT Goal Formulation: With patient Time For Goal Achievement: 12/21/15 Potential to Achieve Goals: Good ADL Goals Pt Will Perform Grooming: with set-up;with supervision;standing Pt Will Perform Lower Body Dressing: with min guard assist;sit to/from stand Pt Will Transfer to Toilet: with min guard assist;ambulating;bedside commode (with RW) Pt Will Perform Toileting - Clothing Manipulation and hygiene: with min guard assist;sit to/from stand Additional ADL Goal #1: Pt will independently perform HEP for bilateral UEs to increase strength and coordination.  OT Frequency: Min 2X/week   Barriers to D/C:            Co-evaluation              End of Session Equipment Utilized During Treatment: Gait belt  Activity Tolerance: Patient tolerated treatment well Patient left: with call bell/phone within reach;with bed alarm set;with family/visitor present;in bed   Time: 1359-1416 OT Time Calculation (min): 17 min Charges:  OT General Charges $OT Visit: 1 Procedure OT Evaluation $OT Eval Moderate Complexity: 1 Procedure G-Codes: OT G-codes **NOT FOR INPATIENT CLASS** Functional Assessment Tool Used: clinical judgment Functional Limitation: Self care Self Care Current Status [id number]): At least 20 percent  but less than 40 percent impaired, limited or restricted Self Care Goal Status [id number]): At least 1 percent but less than 20 percent impaired, limited or restricted  Benito Mccreedy OTR/L [id number] 12/14/2015, 3:05 PM

## 2015-12-15 LAB — CBC WITH DIFFERENTIAL/PLATELET
Basophils Absolute: 0 10*3/uL (ref 0.0–0.1)
Basophils Relative: 1 %
Eosinophils Absolute: 0.3 10*3/uL (ref 0.0–0.7)
Eosinophils Relative: 4 %
HCT: 41.6 % (ref 36.0–46.0)
HEMOGLOBIN: 13.5 g/dL (ref 12.0–15.0)
LYMPHS ABS: 2.1 10*3/uL (ref 0.7–4.0)
LYMPHS PCT: 35 %
MCH: 31.2 pg (ref 26.0–34.0)
MCHC: 32.5 g/dL (ref 30.0–36.0)
MCV: 96.1 fL (ref 78.0–100.0)
Monocytes Absolute: 0.8 10*3/uL (ref 0.1–1.0)
Monocytes Relative: 13 %
NEUTROS PCT: 47 %
Neutro Abs: 2.8 10*3/uL (ref 1.7–7.7)
Platelets: 207 10*3/uL (ref 150–400)
RBC: 4.33 MIL/uL (ref 3.87–5.11)
RDW: 13 % (ref 11.5–15.5)
WBC: 6 10*3/uL (ref 4.0–10.5)

## 2015-12-15 LAB — COMPREHENSIVE METABOLIC PANEL
ALT: 19 U/L (ref 14–54)
AST: 20 U/L (ref 15–41)
Albumin: 3.5 g/dL (ref 3.5–5.0)
Alkaline Phosphatase: 48 U/L (ref 38–126)
Anion gap: 9 (ref 5–15)
BILIRUBIN TOTAL: 1 mg/dL (ref 0.3–1.2)
BUN: 12 mg/dL (ref 6–20)
CO2: 27 mmol/L (ref 22–32)
CREATININE: 0.66 mg/dL (ref 0.44–1.00)
Calcium: 9.3 mg/dL (ref 8.9–10.3)
Chloride: 107 mmol/L (ref 101–111)
GFR calc Af Amer: >60 mL/min (ref >60)
Glucose, Bld: 91 mg/dL (ref 65–99)
POTASSIUM: 3.5 mmol/L (ref 3.5–5.1)
Sodium: 143 mmol/L (ref 135–145)
Total Protein: 6.3 g/dL — ABNORMAL LOW (ref 6.5–8.1)

## 2015-12-15 LAB — MAGNESIUM: Magnesium: 2.2 mg/dL (ref 1.7–2.4)

## 2015-12-15 MED ORDER — POLYETHYLENE GLYCOL 3350 17 G PO PACK
17.0000 g | PACK | Freq: Every day | ORAL | Status: DC
  Administered 2015-12-16 – 2015-12-17 (×2): 17 g via ORAL
  Filled 2015-12-15 (×2): qty 1

## 2015-12-15 MED ORDER — POTASSIUM CHLORIDE CRYS ER 20 MEQ PO TBCR
40.0000 meq | EXTENDED_RELEASE_TABLET | Freq: Once | ORAL | Status: AC
  Administered 2015-12-15: 40 meq via ORAL
  Filled 2015-12-15: qty 2

## 2015-12-15 MED ORDER — CLOPIDOGREL BISULFATE 75 MG PO TABS
75.0000 mg | ORAL_TABLET | Freq: Every day | ORAL | Status: DC
  Administered 2015-12-16 – 2015-12-17 (×2): 75 mg via ORAL
  Filled 2015-12-15 (×2): qty 1

## 2015-12-15 MED ORDER — ALUM & MAG HYDROXIDE-SIMETH 200-200-20 MG/5ML PO SUSP
30.0000 mL | Freq: Once | ORAL | Status: AC
  Administered 2015-12-15: 30 mL via ORAL
  Filled 2015-12-15: qty 30

## 2015-12-15 NOTE — Progress Notes (Signed)
[facility] Progress Note  Patient: Stacey Ramirez [id number]   PCP: Horatio Pel, MD DOB: 06/17/1941   DOA: 12/13/2015   DOS: 12/15/2015   Date of Service: the patient was seen and examined on 12/15/2015  Subjective: The patient speech appears to be improving although they feel that it is about the same. Denies any other complaint. No nausea no vomiting or chest pain. Overnight telemetry shows sinus tachycardia Nutrition: Tolerating oral diet  Brief hospital course: Patient was admitted on 12/13/2015, with complaint of slurred speech as well as left-sided weakness, was found to have right basal ganglia infarct on MRI. Currently further plan is continue further stroke workup, patient will likely need CIR.  Assessment and Plan: 1. Basal ganglia infarction [facility]) Patient presents with left-sided weakness as well as slurred speech. TPA was not given since outside of the window period. MRA shows moderate stenosis of the right PCA, right supraclinoid ICA Echocardiogram shows 60-65% EF Carotid unremarkable. LDL 238-patient has not been able to tolerate statins in the past. May be a candidate for PCS K9 Hemoglobin A1c 5.7. Patient was an 81 mg aspirin will changed to 75 mg Plavix. Was given 325 mg aspirin on admission.  2. Essential hypertension. Permissive hypertension.  3. Recurrent SVT. Telemetry shows the patient has recurrent SVT. We'll start low-dose Lopressor. Maintain K more than 4 magnesium more than 2.  4. Hypothyroidism. Continue Synthroid.  Pain management: When necessary Tylenol Activity: physical therapy recommends CIR Bowel regimen: last BM 12/12/2015 MiraLAX added Diet: Cardiac diet DVT Prophylaxis: subcutaneous Heparin  Advance goals of care discussion: Full code  Family Communication: family was present at bedside, at the time of interview. The pt provided permission to discuss medical plan with the family. Opportunity was given to ask question and  all questions were answered satisfactorily.   Disposition:  Discharge to CIR, pending further stroke workup Expected discharge date: 12/17/2015  Consultants: Neurology Procedures: Echocardiogram, carotid Doppler  Antibiotics: Anti-infectives    Start     Dose/Rate Route Frequency Ordered Stop   12/13/15 1106  trimethoprim (TRIMPEX) tablet 100 mg  Status:  Discontinued     100 mg Oral Daily PRN 12/13/15 1109 12/13/15 1145        Intake/Output Summary (Last 24 hours) at 12/15/15 1506 Last data filed at 12/15/15 1503  Gross per 24 hour  Intake    240 ml  Output      0 ml  Net    240 ml   Filed Weights   12/13/15 0919  Weight: 52.164 kg (115 lb)    Objective: Physical Exam: Filed Vitals:   12/14/15 2147 12/15/15 0154 12/15/15 0516 12/15/15 1501  BP: 151/63 140/74 167/66 132/73  Pulse: 75 57 65 62  Temp: 98.4 F (36.9 C) 98.3 F (36.8 C) 98.1 F (36.7 C) 98.5 F (36.9 C)  TempSrc: Oral Oral Oral Oral  Resp: 20 20 20 20   Height:      Weight:      SpO2: 96% 97% 98% 94%    General: Alert, Awake and Oriented to Time, Place and Person. Appear in moderate distress Eyes: PERRL, Conjunctiva normal ENT: Oral Mucosa clear moist. Neck: difficult to assess  JVD, no Abnormal Mass Or lumps Cardiovascular: S1 and S2 Present, aortic systolic Murmur, Peripheral Pulses Present Respiratory: Bilateral Air entry equal and Decreased, Clear to Auscultation, no Crackles, no wheezes Abdomen: Bowel Sound present, Soft and no tenderness Skin: no redness, no Rash  Extremities: no Pedal edema, no calf tenderness  Neurologic: Left-sided weakness, dysarthria  Data Reviewed: CBC:  Recent Labs Lab 12/13/15 0927 12/13/15 0936 12/15/15 0545  WBC 7.5  --  6.0  NEUTROABS 5.7  --  2.8  HGB 13.2 14.6 13.5  HCT 40.2 43.0 41.6  MCV 93.1  --  96.1  PLT 210  --  [id number]   Basic Metabolic Panel:  Recent Labs Lab 12/13/15 0927 12/13/15 0936 12/15/15 0545  NA 141 141 143  K 3.7 3.6 3.5  CL  106 107 107  CO2 23  --  27  GLUCOSE 119* 113* 91  BUN 13 15 12   CREATININE 0.63 0.50 0.66  CALCIUM 9.4  --  9.3  MG  --   --  2.2    Liver Function Tests:  Recent Labs Lab 12/13/15 0927 12/15/15 0545  AST 25 20  ALT 22 19  ALKPHOS 55 48  BILITOT 0.8 1.0  PROT 7.1 6.3*  ALBUMIN 4.1 3.5   No results for input(s): LIPASE, AMYLASE in the last 168 hours. No results for input(s): AMMONIA in the last 168 hours. Coagulation Profile:  Recent Labs Lab 12/13/15 0927  INR 1.01   Studies: No results found.   Scheduled Meds: .  stroke: mapping our early stages of recovery book   Does not apply Once  . aspirin  325 mg Oral Daily  . enoxaparin (LOVENOX) injection  40 mg Subcutaneous Q24H  . levothyroxine  25 mcg Oral QAC breakfast  . loteprednol  1 drop Both Eyes QID  . metoprolol tartrate  12.5 mg Oral BID  . rosuvastatin  10 mg Oral q1800   Continuous Infusions:  PRN Meds: ALPRAZolam, estradiol, senna-docusate  Time spent: 30 minutes  Author: Berle Mull, MD [facility] Pager: [phone] 12/15/2015 3:06 PM  If 7PM-7AM, please contact night-coverage at [email or web address], password [facility]

## 2015-12-15 NOTE — Progress Notes (Signed)
STROKE TEAM PROGRESS NOTE   HISTORY OF PRESENT ILLNESS Stacey Ramirez is a 75 y.o. female who presents with left-sided weakness that started sometime between when she went to bed at 11:30 when she woke up at 2:30. She presented subsequent later to the emergency room and at that point was outside any IV or IA interventional windows and therefore no code stroke was called. She denies numbness.  An MRI was performed in the emergency department which demonstrates right basal ganglia infarct.   LKW: 11:30 PM 5/11 tpa given?: no, outside of window   SUBJECTIVE (INTERVAL HISTORY) Husband at bedside, she is intolerant of statins and cannot take anymore, they see Dr. Rexene Alberts outpatient at [facility], awaiting CIR. Discussed TEE for evaluation and loop recorder, patient agrees.    OBJECTIVE Temp:  [97.9 F (36.6 C)-98.4 F (36.9 C)] 98.1 F (36.7 C) (05/14 0516) Pulse Rate:  [57-75] 65 (05/14 0516) Cardiac Rhythm:  [-] Normal sinus rhythm (05/13 1900) Resp:  [20] 20 (05/14 0516) BP: (137-167)/(63-74) 167/66 mmHg (05/14 0516) SpO2:  [95 %-98 %] 98 % (05/14 0516)  CBC:   Recent Labs Lab 12/13/15 0927 12/13/15 0936 12/15/15 0545  WBC 7.5  --  6.0  NEUTROABS 5.7  --  2.8  HGB 13.2 14.6 13.5  HCT 40.2 43.0 41.6  MCV 93.1  --  96.1  PLT 210  --  [id number]    Basic Metabolic Panel:   Recent Labs Lab 12/13/15 0927 12/13/15 0936 12/15/15 0545  NA 141 141 143  K 3.7 3.6 3.5  CL 106 107 107  CO2 23  --  27  GLUCOSE 119* 113* 91  BUN 13 15 12   CREATININE 0.63 0.50 0.66  CALCIUM 9.4  --  9.3  MG  --   --  2.2    Lipid Panel:     Component Value Date/Time   CHOL 315* 12/13/2015 1558   CHOL 331* 04/14/2013 0921   TRIG 59 12/13/2015 1558   HDL 65 12/13/2015 1558   HDL 65 04/14/2013 0921   CHOLHDL 4.8 12/13/2015 1558   CHOLHDL 5.1* 04/14/2013 0921   VLDL 12 12/13/2015 1558   LDLCALC 238* 12/13/2015 1558   LDLCALC 244* 04/14/2013 0921   HgbA1c:  Lab Results  Component Value Date    HGBA1C 5.7* 12/13/2015   Urine Drug Screen: No results found for: LABOPIA, COCAINSCRNUR, LABBENZ, AMPHETMU, THCU, LABBARB    IMAGING  Dg Chest 2 View 12/13/2015   No edema or consolidation.    Ct Head Wo Contrast 12/13/2015   No acute abnormality. Atrophy and chronic microvascular ischemic change.     Mr Jodene Nam Head/brain Wo Cm 12/13/2015   1. Confluent acute infarct in the right basal ganglia with no associated hemorrhage or mass effect. Superimposed punctate posterior right MCA periventricular white matter infarct.  2. Negative for emergent large vessel occlusion. Anterior circulation atherosclerosis appears stable since 2014, including at least moderate stenosis of the supraclinoid right ICA (series 705, image 6).  3. Mild progression of posterior circulation atherosclerosis since 2014 including new mild stenosis of the mid basilar artery and moderate stenosis of the right PCA P1 segment.  4. Underlying chronic small vessel ischemia, moderate for age.    PHYSICAL EXAM  Physical exam: Exam: Gen: NAD         CV: RRR, no MRG. No Carotid Bruits. No peripheral edema, warm, nontender Eyes: Conjunctivae clear without exudates or hemorrhage  Neuro: Detailed Neurologic Exam  Speech:    Speech  is normal; with normal comprehension.  Cognition:    The patient is oriented to person, place, and time;    Cranial Nerves:    The pupils are equal, round, and reactive to light. The fundi are normal and spontaneous venous pulsations are present. Visual fields are full to finger confrontation. Extraocular movements are intact. Trigeminal sensation is intact and the muscles of mastication are normal. Lower left sided weakness. The palate elevates in the midline. Hearing intact to voice. Voice is normal. Shoulder shrug is normal. The tongue has normal motion without fasciculations.   Coordination:    No dysmetria, left-sided weakness   Motor Observation:    No asymmetry, no atrophy, and no  involuntary movements noted. Tone:    Normal muscle tone.    Posture:    Posture is normal. normal erect    Strength: Left-sided mild weakness of the UE and LE otherwise strength is V/V in the upper and lower limbs.      Sensation: intact to LT  Gait: patient able to stand and walk independently. Stooped posture, small cautious steps with physical therapy.     ASSESSMENT/PLAN Ms. Stacey Ramirez is a 75 y.o. female with history of hypertension, hyperlipidemia, hypothyroidism, aortic stenosis, mitral regurgitation, and previous stroke presenting with left hemiparesis.. She did not receive IV t-PA due to late presentation.  Stroke:  Non-dominant infarcts possibly embolic from an unknown source  Resultant  Left sided face, arm and leg  MRI  Acute infarct in the right basal ganglia & punctate posterior right MCA periventricular white matter infarct.  MRA  moderate stenosis of the supraclinoid right ICA - moderate stenosis of the right PCA P1 segment.   Carotid Doppler - Vertebral arteries appear patent with antegrade flow. Bilateral carotids 1-39 percent stenosis.  TEE and loop recorder ordered.  2D Echo  EF 60-65%. No cardiac source of emboli identified.  LDL 238  HgbA1c 5.7  VTE prophylaxis - Lovenox Diet Heart Room service appropriate?: Yes; Fluid consistency:: Thin  aspirin 81 mg daily prior to admission, now on aspirin 325 mg daily. Change to Plavix 75 daily unless contraindicated.   Patient counseled to be compliant with her antithrombotic medications  Ongoing aggressive stroke risk factor management  Therapy recommendations:  Possible Cir  Disposition:  Possible Cir  Hypertension Permissive hypertension (OK if < 220/120) but gradually normalize in 5-7 days  Hyperlipidemia  Home meds:  Crestor 2.5 mg daily resumed in hospital  LDL 238, goal < 70  Increase Crestor to 10 mg daily if patient agrees, she says she is intolerant. Patient has h/o statin intolerance  hence recommend new PCSK9 inhibitor like Praluent  Continue statin at discharge    Other Stroke Risk Factors  Advanced age  ETOH use  Hx stroke/TIA  Other Active Problems    Hospital day # 1   Personally examined patient and images, and have participated in and made any corrections needed to history, physical, neuro exam,assessment and plan as stated above.  I have personally obtained the history, evaluated lab date, reviewed imaging studies and agree with radiology interpretations.    Sarina Ill, MD Stroke Neurology [phone] [facility]       To contact Stroke Continuity provider, please refer to [email or web address] After hours, contact General Neurology

## 2015-12-15 NOTE — Progress Notes (Signed)
Physical Therapy Treatment Patient Details Name: Stacey Ramirez MRN: [id number] DOB: Nov 25, 1940 Today's Date: 12/15/2015    History of Present Illness Patient is a 75 y.o. female with hx of HTN, hypothyroidism, Mitral regurgitation, hypercholesterolemia, dizziness, SOB, aortic stenosis and CVA presented with left sided weakness, MRI-right basal ganglia infarct, Rt MCA infarct.    PT Comments    Patient progressing well towards PT goals. Reports feeling very tired today. Better able to progress LLE during gait training and correct for midline posture with cues. Focused on functional transfers with proper cueing, dynamic sitting balance, and activation of core musculature. Continues to be motivated. Great CIR candidate. Will follow.   Follow Up Recommendations  CIR     Equipment Recommendations  Other (comment) (TBA)    Recommendations for Other Services       Precautions / Restrictions Precautions Precautions: Fall Restrictions Weight Bearing Restrictions: No    Mobility  Bed Mobility               General bed mobility comments: Up in chair upon PT arrival.   Transfers Overall transfer level: Needs assistance Equipment used: None Transfers: Sit to/from Stand Sit to Stand: Min assist         General transfer comment: Min A to boost from EOB with cues for foot/hand placement, anterior translation and upright posture. Cues for slow descent onto surface. SPT bed to/from chair x2.   Ambulation/Gait Ambulation/Gait assistance: Min assist Ambulation Distance (Feet): 110 Feet Assistive device: 1 person hand held assist (rail) Gait Pattern/deviations: Step-to pattern;Step-through pattern;Decreased stride length;Narrow base of support;Decreased dorsiflexion - left;Decreased weight shift to right Gait velocity: decreased   General Gait Details: Manual cues for weightshift to the right and for midline positioning as pt with tendency to lean left. Better able to progress LLE  esp without distractions.    Stairs            Wheelchair Mobility    Modified Rankin (Stroke Patients Only) Modified Rankin (Stroke Patients Only) Pre-Morbid Rankin Score: No symptoms Modified Rankin: Moderately severe disability     Balance Overall balance assessment: Needs assistance Sitting-balance support: Feet supported;No upper extremity supported Sitting balance-Leahy Scale: Good Sitting balance - Comments: Focused on upright and midline positioning with dynamic reaching activities/balance ~5 minutes using BUEs and reaching in all planes and returning to midline.   Standing balance support: During functional activity Standing balance-Leahy Scale: Fair Standing balance comment: Able to stand unsupported without assist but requires UE support for dynamic standing.                    Cognition Arousal/Alertness: Awake/alert Behavior During Therapy: WFL for tasks assessed/performed Overall Cognitive Status: Impaired/Different from baseline Area of Impairment: Safety/judgement         Safety/Judgement: Decreased awareness of safety          Exercises Other Exercises Other Exercises: Sit to stand x7 from EOb; cues for foot/hand placement and anterior translation/weight shift and upright and slow, controlled descent onto surface    General Comments General comments (skin integrity, edema, etc.): Spouse present in room.      Pertinent Vitals/Pain Pain Assessment: No/denies pain    Home Living                      Prior Function            PT Goals (current goals can now be found in the care plan section) Progress towards PT  goals: Progressing toward goals    Frequency  Min 4X/week    PT Plan Current plan remains appropriate    Co-evaluation             End of Session Equipment Utilized During Treatment: Gait belt Activity Tolerance: Patient tolerated treatment well Patient left: in chair;with call bell/phone within  reach;with chair alarm set;with family/visitor present     Time: 1136-1200 PT Time Calculation (min) (ACUTE ONLY): 24 min  Charges:  $Gait Training: 8-22 mins $Neuromuscular Re-education: 8-22 mins                    G Codes:      Daley Gosse A Zenya Hickam 12/15/2015, 12:14 PM Wray Kearns, [place], DPT [phone]

## 2015-12-16 ENCOUNTER — Inpatient Hospital Stay (HOSPITAL_COMMUNITY): Payer: PPO

## 2015-12-16 ENCOUNTER — Encounter (HOSPITAL_COMMUNITY): Payer: PPO

## 2015-12-16 DIAGNOSIS — I6350 Cerebral infarction due to unspecified occlusion or stenosis of unspecified cerebral artery: Secondary | ICD-10-CM | POA: Insufficient documentation

## 2015-12-16 DIAGNOSIS — I359 Nonrheumatic aortic valve disorder, unspecified: Secondary | ICD-10-CM

## 2015-12-16 DIAGNOSIS — I639 Cerebral infarction, unspecified: Secondary | ICD-10-CM

## 2015-12-16 DIAGNOSIS — I498 Other specified cardiac arrhythmias: Secondary | ICD-10-CM

## 2015-12-16 DIAGNOSIS — E038 Other specified hypothyroidism: Secondary | ICD-10-CM

## 2015-12-16 DIAGNOSIS — R7303 Prediabetes: Secondary | ICD-10-CM

## 2015-12-16 DIAGNOSIS — I471 Supraventricular tachycardia: Secondary | ICD-10-CM

## 2015-12-16 DIAGNOSIS — I6930 Unspecified sequelae of cerebral infarction: Secondary | ICD-10-CM | POA: Insufficient documentation

## 2015-12-16 DIAGNOSIS — I10 Essential (primary) hypertension: Secondary | ICD-10-CM

## 2015-12-16 DIAGNOSIS — I6990 Unspecified sequelae of unspecified cerebrovascular disease: Secondary | ICD-10-CM

## 2015-12-16 DIAGNOSIS — I350 Nonrheumatic aortic (valve) stenosis: Secondary | ICD-10-CM

## 2015-12-16 DIAGNOSIS — E7800 Pure hypercholesterolemia, unspecified: Secondary | ICD-10-CM

## 2015-12-16 MED ORDER — ALUM & MAG HYDROXIDE-SIMETH 200-200-20 MG/5ML PO SUSP
15.0000 mL | ORAL | Status: DC | PRN
  Administered 2015-12-16: 15 mL via ORAL
  Filled 2015-12-16: qty 30

## 2015-12-16 MED ORDER — ASPIRIN EC 325 MG PO TBEC
325.0000 mg | DELAYED_RELEASE_TABLET | Freq: Every day | ORAL | Status: DC
  Administered 2015-12-17: 325 mg via ORAL
  Filled 2015-12-16: qty 1

## 2015-12-16 NOTE — Progress Notes (Signed)
[facility] Progress Note  Patient: Stacey Ramirez [id number]   PCP: Horatio Pel, MD DOB: [birth year]   DOA: 12/13/2015   DOS: 12/16/2015   Date of Service: the patient was seen and examined on 12/16/2015  Subjective: Patient denies any acute complaint. No nausea no vomiting. No abdominal pain. No other acute events overnight. Sinus tachycardia resolved. Nutrition: Tolerating oral diet  Brief hospital course: Patient was admitted on 12/13/2015, with complaint of slurred speech as well as left-sided weakness, was found to have right basal ganglia infarct on MRI. Currently further plan is continue further stroke workup, patient will likely need CIR.  Assessment and Plan: 1. Basal ganglia infarction [facility]) Patient presents with left-sided weakness as well as slurred speech. TPA was not given since outside of the window period. MRA shows moderate stenosis of the right PCA, right supraclinoid ICA Echocardiogram shows 60-65% EF Carotid unremarkable. LDL 238-patient has not been able to tolerate statins in the past. May be a candidate for PCS K9 Hemoglobin A1c 5.7. Patient was an 81 mg aspirin will changed to 75 mg Plavix. Was given 325 mg aspirin on admission. TEE on 12/17/2015  2. Essential hypertension. Permissive hypertension.  3. Recurrent SVT. Telemetry shows the patient has recurrent SVT. We'll start low-dose Lopressor. Maintain K more than 4 magnesium more than 2.  4. Hypothyroidism. Continue Synthroid.  Pain management: When necessary Tylenol Activity: physical therapy recommends CIR Bowel regimen: last BM 12/12/2015 MiraLAX added Diet: Cardiac diet DVT Prophylaxis: subcutaneous Heparin  Advance goals of care discussion: Full code  Family Communication: family was present at bedside, at the time of interview. The pt provided permission to discuss medical plan with the family. Opportunity was given to ask question and all questions were answered  satisfactorily.   Disposition:  Discharge to CIR, pending further stroke workup Expected discharge date: 12/17/2015  Consultants: Neurology Procedures: Echocardiogram, carotid Doppler  Antibiotics: Anti-infectives    Start     Dose/Rate Route Frequency Ordered Stop   12/13/15 1106  trimethoprim (TRIMPEX) tablet 100 mg  Status:  Discontinued     100 mg Oral Daily PRN 12/13/15 1109 12/13/15 1145        Intake/Output Summary (Last 24 hours) at 12/16/15 2001 Last data filed at 12/15/15 2117  Gross per 24 hour  Intake    240 ml  Output      0 ml  Net    240 ml   Filed Weights   12/13/15 0919  Weight: 52.164 kg (115 lb)    Objective: Physical Exam: Filed Vitals:   12/16/15 0308 12/16/15 0533 12/16/15 1024 12/16/15 1808  BP: 143/77 135/79 148/75 131/57  Pulse:  68 69 64  Temp:  98.3 F (36.8 C) 97.7 F (36.5 C) 98.4 F (36.9 C)  TempSrc:  Oral Oral Oral  Resp:  20 18 18   Height:      Weight:      SpO2:  98% 96% 97%    General: Alert, Awake and Oriented to Time, Place and Person. Appear in moderate distress Eyes: PERRL, Conjunctiva normal ENT: Oral Mucosa clear moist. Neck: difficult to assess  JVD, no Abnormal Mass Or lumps Cardiovascular: S1 and S2 Present, aortic systolic Murmur, Peripheral Pulses Present Respiratory: Bilateral Air entry equal and Decreased, Clear to Auscultation, no Crackles, no wheezes Abdomen: Bowel Sound present, Soft and no tenderness Skin: no redness, no Rash  Extremities: no Pedal edema, no calf tenderness Neurologic: Left-sided weakness, dysarthria  Data Reviewed: CBC:  Recent Labs Lab  12/13/15 0927 12/13/15 0936 12/15/15 0545  WBC 7.5  --  6.0  NEUTROABS 5.7  --  2.8  HGB 13.2 14.6 13.5  HCT 40.2 43.0 41.6  MCV 93.1  --  96.1  PLT 210  --  [id number]   Basic Metabolic Panel:  Recent Labs Lab 12/13/15 0927 12/13/15 0936 12/15/15 0545  NA 141 141 143  K 3.7 3.6 3.5  CL 106 107 107  CO2 23  --  27  GLUCOSE 119* 113* 91    BUN 13 15 12   CREATININE 0.63 0.50 0.66  CALCIUM 9.4  --  9.3  MG  --   --  2.2    Liver Function Tests:  Recent Labs Lab 12/13/15 0927 12/15/15 0545  AST 25 20  ALT 22 19  ALKPHOS 55 48  BILITOT 0.8 1.0  PROT 7.1 6.3*  ALBUMIN 4.1 3.5   No results for input(s): LIPASE, AMYLASE in the last 168 hours. No results for input(s): AMMONIA in the last 168 hours. Coagulation Profile:  Recent Labs Lab 12/13/15 0927  INR 1.01   Studies: No results found.   Scheduled Meds: .  stroke: mapping our early stages of recovery book   Does not apply Once  . clopidogrel  75 mg Oral Daily  . enoxaparin (LOVENOX) injection  40 mg Subcutaneous Q24H  . levothyroxine  25 mcg Oral QAC breakfast  . loteprednol  1 drop Both Eyes QID  . metoprolol tartrate  12.5 mg Oral BID  . polyethylene glycol  17 g Oral Daily  . rosuvastatin  10 mg Oral q1800   Continuous Infusions:  PRN Meds: ALPRAZolam, alum & mag hydroxide-simeth, estradiol, senna-docusate  Time spent: 30 minutes  Author: Berle Mull, MD [facility] Pager: [phone] 12/16/2015 8:01 PM  If 7PM-7AM, please contact night-coverage at [email or web address], password [facility]

## 2015-12-16 NOTE — Progress Notes (Signed)
*  PRELIMINARY RESULTS* Vascular Ultrasound Lower extremity venous duplex has been completed.  Preliminary findings: No evidence of DVT or baker's cyst.   Landry Mellow, RDMS, RVT  12/16/2015, 4:11 PM

## 2015-12-16 NOTE — Care Management Note (Signed)
Case Management Note  Patient Details  Name: Stacey Ramirez MRN: [id number] Date of Birth: 11-17-1940  Subjective/Objective:                    Action/Plan: Rec is for CIR. CM following for discharge needs.   Expected Discharge Date:                  Expected Discharge Plan:     In-House Referral:     Discharge planning Services     Post Acute Care Choice:    Choice offered to:     DME Arranged:    DME Agency:     HH Arranged:    [place] Agency:     Status of Service:     Medicare Important Message Given:    Date Medicare IM Given:    Medicare IM give by:    Date Additional Medicare IM Given:    Additional Medicare Important Message give by:     If discussed at [place] of Stay Meetings, dates discussed:    Additional Comments:  Pollie Friar, RN 12/16/2015, 2:00 PM

## 2015-12-16 NOTE — Consult Note (Signed)
Physical Medicine and Rehabilitation Consult   Reason for Consult: Left sided weakness, left visual field deficits and speech difficulties Referring Physician: Dr. Berle Mull   HPI: Stacey Ramirez is a 75 y.o. female with history of HTN, aortic stenosis, L-CVA 2014 with gait disorder and minimal RLE weakness who was admitted on 12/13/15 with left sided weakness and dysarthric speech. MRI/MRA brain Ramirez revealing acute infarct in right basal ganglia with mild progression of PCA atherosclerosis and no large vessel occlusion. 2D echo with EF 60-65% with mild LVH, no aortic or mitral stenosis and no wall abnormality. Carotid dopplers without significant ICA stenosis. ASA changed to plavix for stroked due to small vessel disease and uncontrolled hyperlipidemia as patient intolerant of statins. Crestor increased to 10 mg daily and low dose BB added due to recurrent SVT.  Plans for TEE tomorrow for full workup of stroke. Cognitive evaluation showed dysarthria and cognitive deficits to be at baseline.  Patient with resultant LLE weakness with narrow BOS, visual deficits in left upper quadrant ans well as delayed processing. CIR recommended by MD and rehab team.   Has been sitting up in chair for about an hour this weekend. Did get a little dizzy with activity this am.   Review of Systems  HENT: Negative for hearing loss.   Eyes: Positive for blurred vision. Negative for double vision.  Respiratory: Negative for cough, shortness of breath and wheezing.   Cardiovascular: Negative for chest pain, palpitations and leg swelling.  Gastrointestinal: Positive for constipation. Negative for heartburn, nausea and abdominal pain.       Problems with swallowing for years--has to sit up to eat.   Genitourinary: Negative for dysuria, urgency and frequency.  Musculoskeletal: Negative for myalgias, back pain and joint pain.  Skin: Negative for itching and rash.  Neurological: Positive for speech change, focal  weakness and headaches. Negative for sensory change.  Psychiatric/Behavioral: Positive for memory loss.  All other systems reviewed and are negative.   Past Medical History  Diagnosis Date  . Hypertension     LIABLE  . Hypercholesterolemia   . Hypothyroidism   . Vitamin D deficiency   . Palpitations   . History of dizziness   . LVH (left ventricular hypertrophy)   . Aortic stenosis   . Mitral regurgitation   . SOB (shortness of breath)   . Difficulty walking   . Cerebrovascular accident (stroke) [facility])     Past Surgical History  Procedure Laterality Date  . Cardiovascular stress test  2002    NORMAL  . Transthoracic echocardiogram  2008    SHOWED LEFT VENTRICULAR HYPERTROPHY AND MILD AORTIC STENOSIS    Family History  Problem Relation Age of Onset  . Heart attack Mother   . Alzheimer's disease Mother     Social History:  Stacey Ramirez is retired and can assist after discharge. 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 She  reports that she has never smoked. She does not have any smokeless tobacco history on file. She reports that she drinks a glass of wine daily. She reports that she does not use illicit drugs.   Allergies  Allergen Reactions  . Sulfa Drugs Cross Reactors Itching  . Zocor [Simvastatin] Other (See Comments)    Feels like she is going to pass out, weakness  . Micardis [Telmisartan] Other (See Comments)    Feels like she is going to pass out    Medications Prior to Admission  Medication Sig Dispense  Refill  . ALPRAZolam (XANAX) 0.25 MG tablet Take 1 tablet (0.25 mg total) by mouth daily as needed for anxiety (anxiety). 30 tablet 1  . amLODipine (NORVASC) 2.5 MG tablet Take 2.5 mg by mouth every evening.     Marland Kitchen aspirin 81 MG tablet Take 81 mg by mouth daily.    Marland Kitchen estradiol (ESTRACE) 0.1 MG/GM vaginal cream Place 2 g vaginally as needed (dryness).     Marland Kitchen levothyroxine (SYNTHROID, LEVOTHROID) 25 MCG tablet Take 25 mcg by mouth daily before breakfast.    6  . LOTEMAX 0.5 % ophthalmic suspension Place 1 drop into both eyes 4 (four) times daily.     . rosuvastatin (CRESTOR) 10 MG tablet Take 10 mg by mouth as directed. 1/4 tablet every other day    . trimethoprim (TRIMPEX) 100 MG tablet Take 100 mg by mouth daily as needed (infection).     . valsartan (DIOVAN) 320 MG tablet Take 320 mg by mouth daily.   2  . diltiazem 2 % GEL Apply 1 application topically 2 (two) times daily. (Patient not taking: Reported on 12/13/2015) 30 g 3  . hydrochlorothiazide (MICROZIDE) 12.5 MG capsule 1 Capsule on Monday, Wednesday, AND Friday ONLY (Patient not taking: Reported on 12/13/2015) 30 capsule 5    Home: Home Living Family/patient expects to be discharged to:: Inpatient rehab Living Arrangements: Spouse/significant other Available Help at Discharge: Family, Available 24 hours/day Type of Home: House Home Access: Stairs to enter [employer] of Steps: 2 Entrance Stairs-Rails: None Home Layout: Two level, Able to live on main level with bedroom/bathroom Alternate Level Stairs-Number of Steps: 1 flight Alternate Level Stairs-Rails: Right, Left Home Equipment: None  Lives With: Spouse  Functional History: Prior Function Level of Independence: Independent Comments: Drives, cooks, cleans. Functional Status:  Mobility: Bed Mobility Overal bed mobility: Needs Assistance Bed Mobility: Supine to Sit Supine to sit: Min guard Sit to supine: Supervision General bed mobility comments: Min guard for safety.  Transfers Overall transfer level: Needs assistance Equipment used: None Transfers: Sit to/from Stand Sit to Stand: Min assist General transfer comment: MIn A to boost from EOB with cues for foot/hand placement, anterior translation and upright posture. Posterior bias. Cues for slow descent onto surface. transferred to chair post ambulation. Ambulation/Gait Ambulation/Gait assistance: Min assist Ambulation Distance (Feet): 120 Feet Assistive  device: 1 person hand held assist (rail) Gait Pattern/deviations: Step-to pattern, Step-through pattern, Decreased stride length, Narrow base of support, Decreased dorsiflexion - left, Decreased weight shift to right General Gait Details: Manual cues for weightshift to the right and for midline positioning as pt with tendency to lean left. Better able to progress LLE today. Does not do well with distractions. Needs cues to stay on task.  Gait velocity: decreased    ADL: ADL Overall ADL's : Needs assistance/impaired Lower Body Bathing: Sit to/from stand, Minimal assistance Lower Body Dressing: Minimal assistance, Sit to/from stand Toilet Transfer: Moderate assistance, Ambulation (sit to stand from bed) Functional mobility during ADLs:  (Mod A-ambulation; Min A-sit to stand) General ADL Comments: explained to compensate for vision in Lt upper quadrant to turn her head that way.   Cognition: Cognition Overall Cognitive Status: Impaired/Different from baseline Arousal/Alertness:  (form memory) Orientation Level: Oriented X4 Attention: Sustained Sustained Attention: Appears intact Memory: Impaired Memory Impairment: Retrieval deficit, Storage deficit, Decreased recall of new information (scored mod impairment on cognistat) Awareness: Appears intact Problem Solving: Appears intact Safety/Judgment: Appears intact Cognition Arousal/Alertness: Awake/alert Behavior During Therapy: WFL for tasks assessed/performed Overall  Cognitive Status: Impaired/Different from baseline Area of Impairment: Safety/judgement, Memory Memory: Decreased short-term memory Safety/Judgement: Decreased awareness of safety   Blood pressure 148/75, pulse 69, temperature 97.7 F (36.5 C), temperature source Oral, resp. rate 18, height 5\' 2"  (1.575 m), weight 52.164 kg (115 lb), SpO2 96 %. Physical Exam  Vitals reviewed. Constitutional: She is oriented to person, place, and time. She appears well-developed and  well-nourished.  HENT:  Head: Normocephalic and atraumatic.  Eyes: Conjunctivae and EOM are normal. Pupils are unequal.  Left pupil with mild irregularity. Left lateral field cut.   Neck: Normal range of motion. Neck supple.  Cardiovascular: Normal rate and regular rhythm.   Respiratory: Effort normal and breath sounds normal. No stridor. No respiratory distress. She has no wheezes.  GI: Soft. Bowel sounds are normal. She exhibits no distension. There is no tenderness.  Musculoskeletal: She exhibits no edema or tenderness.  Neurological: She is alert and oriented to person, place, and time. A cranial nerve deficit is present.  Had decreased recall and lacks insight and  awareness of deficits.  She has left facial weakness.  Speech soft but clear with minimal dysarthria.  She is able to follow one and two step commands without difficulty.    Motor:  Right upper extremity/right lower extremity: 5/5 proximal distal Left upper; 4/5 proximal to distal Left lower extremity and 4+/5 proximal distal DTRs symmetric Sensation intact light touch   Skin: Skin is warm and dry. No rash noted. No erythema.  Psychiatric: She has a normal mood and affect. Her behavior is normal. Her speech is not delayed and not tangential.    No results found for this or any previous visit (from the past 24 hour(s)). No results found.  Assessment/Plan: Diagnosis: acute infarct in right basal ganglia Labs and images independently reviewed.  Records reviewed and summated above. Stroke: Continue secondary stroke prophylaxis and Risk Factor Modification listed below:   Antiplatelet therapy:   Blood Pressure Management:  Continue current medication with prn's with permisive HTN per primary team Statin Agent:  Monitor for tolerance, due to history Pre-Diabetes management:   Left sided hemiparesis Motor recovery: Fluoxetine  1. Does the need for close, 24 hr/day medical supervision in concert with the patient's rehab  needs make it unreasonable for this patient to be served in a less intensive setting? Yes  2. Co-Morbidities requiring supervision/potential complications: SVT (monitor heart rate with increased physical activity), hyperlipidemia, HTN (monitor and provide prns in accordance with increased physical exertion and pain), aortic stenosis (Monitor in accordance with increased physical activity and avoid UE resistance excercises), L-CVA 2014 with gait disorder and minimal RLE weakness, hypothyroidism (continue meds, ensure mood does not limit therapies) 3. Due to safety, disease management, medication administration and patient education, does the patient require 24 hr/day rehab nursing? Yes 4. Does the patient require coordinated care of a physician, rehab nurse, PT (1-2 hrs/day, 5 days/week), OT (1-2 hrs/day, 5 days/week) and SLP (1-2 hrs/day, 5 days/week) to address physical and functional deficits in the context of the above medical diagnosis(es)? Yes Addressing deficits in the following areas: balance, endurance, locomotion, strength, transferring, dressing, toileting, speech and psychosocial support 5. Can the patient actively participate in an intensive therapy program of at least 3 hrs of therapy per day at least 5 days per week? Yes 6. The potential for patient to make measurable gains while on inpatient rehab is excellent and good 7. Anticipated functional outcomes upon discharge from inpatient rehab are modified independent and supervision  with PT, modified independent with OT, modified independent with SLP. 8. Estimated rehab length of stay to reach the above functional goals is: 9-13 days. 9. Does the patient have adequate social supports and living environment to accommodate these discharge functional goals? Yes 10. Anticipated D/C setting: Home 11. Anticipated post D/C treatments: HH therapy and Home excercise program 12. Overall Rehab/Functional Prognosis: good  RECOMMENDATIONS: This  patient's condition is appropriate for continued rehabilitative care in the following setting: CIR after completion of medical workup (TEE pending).    Patient has agreed to participate in recommended program. Yes Note that insurance prior authorization may be required for reimbursement for recommended care.  Comment: Rehab Admissions Coordinator to follow up.  Delice Lesch, MD 12/16/2015

## 2015-12-16 NOTE — Consult Note (Signed)
ELECTROPHYSIOLOGY CONSULT NOTE  Patient ID: Stacey Ramirez MRN: [id number], DOB/AGE: 75-Jan-1942   Admit date: 12/13/2015 Date of Consult: 12/16/2015  Primary Physician: Horatio Pel, MD Primary Cardiologist: Dr. Mare Ferrari (pending new) Reason for Consultation: Cryptogenic stroke ; recommendations regarding Implantable Loop Recorder Requesting MD; Dr. Erlinda Hong  History of Present Illness Stacey Ramirez was admitted on 12/13/2015 with .  PMHx include prior CVA, labile HTN, HLD, hypothyroidism, They first developed symptoms while at home with L leg weakness and facical numbness.  Imaging demonstrated Right large BG and punctate periventricular WM infarcts, possibly embolic from an unknown source.  she has undergone workup for stroke including echocardiogram and carotid dopplers.  The patient has been monitored on telemetry which has demonstrated sinus rhythm with no arrhythmias.  Inpatient stroke work-up is to be completed with a TEE.   Echocardiogram this admission demonstrated   Study Conclusions  - Left ventricle: The cavity size was normal. Wall thickness was  increased in a pattern of mild LVH. Systolic function was normal.  The estimated ejection fraction was in the range of 60% to 65%.  Wall motion was normal; there were no regional wall motion  abnormalities. Doppler parameters are consistent with abnormal  left ventricular relaxation (grade 1 diastolic dysfunction). - Aortic valve: There was no stenosis. - Mitral valve: There was no significant regurgitation. - Right ventricle: The cavity size was normal. Systolic function  was normal. - Pulmonary arteries: No complete TR doppler jet so unable to  estimate PA systolic pressure. - Inferior vena cava: The vessel was normal in size. The  respirophasic diameter changes were in the normal range (>= 50%),  consistent with normal central venous pressure.  Impressions:  - Normal LV size with mild LV hypertrophy. EF  60-65%. Normal RV  size and systolic function. No significant valvular  abnormalities.    Lab work is reviewed.  Prior to admission, the patient denies chest pain, shortness of breath, she has had over the years infrequent palpitations, and unassociated fleeting "spells" of feeling lightheaded, no syncope.  They are recovering from their stroke with disposition pending at discharge.  Historically 2014 has worn 30 day EM, only one page noted, SR, very brief PAT EP has been asked to evaluate for placement of an implantable loop recorder to monitor for atrial fibrillation.     Past Medical History  Diagnosis Date  . Hypertension     LIABLE  . Hypercholesterolemia   . Hypothyroidism   . Vitamin D deficiency   . Palpitations   . History of dizziness   . LVH (left ventricular hypertrophy)   . Aortic stenosis   . Mitral regurgitation   . SOB (shortness of breath)   . Difficulty walking   . Cerebrovascular accident (stroke) [facility])      Surgical History:  Past Surgical History  Procedure Laterality Date  . Cardiovascular stress test  2002    NORMAL  . Transthoracic echocardiogram  2008    SHOWED LEFT VENTRICULAR HYPERTROPHY AND MILD AORTIC STENOSIS     Prescriptions prior to admission  Medication Sig Dispense Refill Last Dose  . ALPRAZolam (XANAX) 0.25 MG tablet Take 1 tablet (0.25 mg total) by mouth daily as needed for anxiety (anxiety). 30 tablet 1 12/12/2015 at Unknown time  . amLODipine (NORVASC) 2.5 MG tablet Take 2.5 mg by mouth every evening.    12/12/2015 at Unknown time  . aspirin 81 MG tablet Take 81 mg by mouth daily.   12/12/2015 at Unknown  time  . estradiol (ESTRACE) 0.1 MG/GM vaginal cream Place 2 g vaginally as needed (dryness).    12/12/2015 at Unknown time  . levothyroxine (SYNTHROID, LEVOTHROID) 25 MCG tablet Take 25 mcg by mouth daily before breakfast.   6 12/13/2015 at Unknown time  . LOTEMAX 0.5 % ophthalmic suspension Place 1 drop into both eyes 4 (four)  times daily.    12/12/2015 at Unknown time  . rosuvastatin (CRESTOR) 10 MG tablet Take 10 mg by mouth as directed. 1/4 tablet every other day   12/12/2015 at Unknown time  . trimethoprim (TRIMPEX) 100 MG tablet Take 100 mg by mouth daily as needed (infection).    Past Month at Unknown time  . valsartan (DIOVAN) 320 MG tablet Take 320 mg by mouth daily.   2 12/12/2015 at Unknown time  . diltiazem 2 % GEL Apply 1 application topically 2 (two) times daily. (Patient not taking: Reported on 12/13/2015) 30 g 3 Not Taking at Unknown time  . hydrochlorothiazide (MICROZIDE) 12.5 MG capsule 1 Capsule on Monday, Wednesday, AND Friday ONLY (Patient not taking: Reported on 12/13/2015) 30 capsule 5 Not Taking at Unknown time    Inpatient Medications:  .  stroke: mapping our early stages of recovery book   Does not apply Once  . clopidogrel  75 mg Oral Daily  . enoxaparin (LOVENOX) injection  40 mg Subcutaneous Q24H  . levothyroxine  25 mcg Oral QAC breakfast  . loteprednol  1 drop Both Eyes QID  . metoprolol tartrate  12.5 mg Oral BID  . polyethylene glycol  17 g Oral Daily  . rosuvastatin  10 mg Oral q1800    Allergies:  Allergies  Allergen Reactions  . Sulfa Drugs Cross Reactors Itching  . Zocor [Simvastatin] Other (See Comments)    Feels like she is going to pass out, weakness  . Micardis [Telmisartan] Other (See Comments)    Feels like she is going to pass out    Social History   Social History  . Marital Status: Married    Spouse Name: N/A  . Number of Children: 2  . Years of Education: college   Occupational History  . retired    Social History Main Topics  . Smoking status: Never Smoker   . Smokeless tobacco: Not on file  . Alcohol Use: Yes  . Drug Use: No  . Sexual Activity: Not on file   Other Topics Concern  . Not on file   Social History Narrative     Family History  Problem Relation Age of Onset  . Heart attack Mother   . Alzheimer's disease Mother       Review of  Systems: All other systems reviewed and are otherwise negative except as noted above.  Physical Exam: Filed Vitals:   12/16/15 0308 12/16/15 0533 12/16/15 1024 12/16/15 1808  BP: 143/77 135/79 148/75 131/57  Pulse:  68 69 64  Temp:  98.3 F (36.8 C) 97.7 F (36.5 C) 98.4 F (36.9 C)  TempSrc:  Oral Oral Oral  Resp:  20 18 18   Height:      Weight:      SpO2:  98% 96% 97%    GEN- The patient is well appearing, alert and oriented x 3 today.   Head- normocephalic, atraumatic Eyes-  Sclera clear, conjunctiva pink Ears- hearing intact Oropharynx- clear Neck- supple Lungs- Clear to ausculation bilaterally, normal work of breathing Heart- Regular rate and rhythm, no murmurs, rubs or gallops  GI- soft, NT Extremities-  no clubbing, cyanosis, or edema MS- no significant deformity or atrophy Skin- no rash or lesion Psych- euthymic mood, full affect   Labs:   Lab Results  Component Value Date   WBC 6.0 12/15/2015   HGB 13.5 12/15/2015   HCT 41.6 12/15/2015   MCV 96.1 12/15/2015   PLT 207 12/15/2015    Recent Labs Lab 12/15/15 0545  NA 143  K 3.5  CL 107  CO2 27  BUN 12  CREATININE 0.66  CALCIUM 9.3  PROT 6.3*  BILITOT 1.0  ALKPHOS 48  ALT 19  AST 20  GLUCOSE 91     Radiology/Studies: Dg Chest 2 View 12/13/2015  CLINICAL DATA:  Dizziness and weakness.  Hypertension. EXAM: CHEST  2 VIEW COMPARISON:  None. FINDINGS: There is no edema or consolidation. Heart size and pulmonary vascularity are normal. There is atherosclerotic calcification in the aorta. No adenopathy. No bone lesions. IMPRESSION: No edema or consolidation. Electronically Signed   By: Lowella Grip III M.D.   On: 12/13/2015 10:43   Ct Head Wo Contrast 12/13/2015  CLINICAL DATA:  Onset of inability to walk this morning. Initial encounter. No known injury. EXAM: CT HEAD WITHOUT CONTRAST TECHNIQUE: Contiguous axial images were obtained from the base of the skull through the vertex without intravenous  contrast. COMPARISON:  None. FINDINGS: There is cortical atrophy and chronic microvascular ischemic change. No evidence of acute intracranial abnormality including hemorrhage, infarct, mass lesion, mass effect or abnormal extra-axial fluid collection is identified. No hydrocephalus or pneumocephalus. The calvarium is intact. Imaged paranasal sinuses and mastoid air cells are clear. IMPRESSION: No acute abnormality. Atrophy and chronic microvascular ischemic change. Electronically Signed   By: Inge Rise M.D.   On: 12/13/2015 10:41   Mr Brain Wo Contrast 12/13/2015  CLINICAL DATA:  75 year old female with slurred speech, difficulty walking and facial droop symptom onset today. Initial encounter. EXAM: MRI HEAD WITHOUT CONTRAST MRA HEAD WITHOUT CONTRAST TECHNIQUE: Multiplanar, multiecho pulse sequences of the brain and surrounding structures were obtained without intravenous contrast. Angiographic images of the head were obtained using MRA technique without contrast. COMPARISON:  Head CT without contrast 0955 hours today. Head and neck MRA a 04/19/2013. FINDINGS: MRI HEAD FINDINGS Major intracranial vascular flow voids are preserved. Confluent 3.5 cm area of restricted diffusion in the right basal ganglia. Minimal to mild associated T2 and FLAIR hyperintensity. No associated hemorrhage. Mild regional mass effect, no significant intracranial mass effect. There is a superimposed punctate area of periventricular white matter restricted diffusion at the atrium on the right (series 8, image 11). No contralateral or posterior fossa restricted diffusion. Superimposed small chronic lacunar infarcts in both cerebellar hemispheres an the thalamus. Increased perivascular spaces throughout the basal ganglia. Patchy mostly posterior hemisphere nonspecific cerebral white matter T2 and FLAIR hyperintensity. Some of these white matter changes ppm most resemble chronic lacunar infarcts. No supratentorial cortical  encephalomalacia identified. There are scattered chronic micro hemorrhages in the temporal lobes and cerebellum. No midline shift, evidence of mass lesion, ventriculomegaly, extra-axial collection or acute intracranial hemorrhage. Cervicomedullary junction and pituitary are within normal limits. Negative visualized cervical spine. Mildly prominent internal auditory canals with otherwise negative visualized internal auditory structures. Mastoids are clear. Paranasal sinuses are clear. Negative orbit and scalp soft tissues. Visualized bone marrow signal is within normal limits. MRA HEAD FINDINGS Stable antegrade flow in the posterior circulation with fairly codominant distal vertebral arteries. Both PICA origins remain patent. Vertebrobasilar junction remains patent. Mild distal vertebral artery irregularity without stenosis  appears stable. Mid basilar artery irregularity with up to mild stenosis has mildly progressed. SCA and PCA origins remain patent. There is mild to moderate stenosis in the right PCA P1 segment which is new. Bilateral PCA branches are stable. Posterior communicating arteries are diminutive or absent. Stable antegrade flow in both ICA siphons and distal cervical ICAs. Bilateral siphon atherosclerosis and irregularity. At least moderate stenosis just distal to the right ICA anterior genu appears stable (series 705, image 6). Normal ophthalmic artery origins. Patent carotid termini. The right MCA origin and M1 segment remain patent. Mild M1 irregularity has not significantly changed. Right MCA bifurcation remains patent. Visualized right MCA branches appear stable with mild distal M2 and M3 branch irregularity. No right MCA branch occlusion identified. Bilateral ACA and left MCA branches are stable with mild irregularity. IMPRESSION: 1. Confluent acute infarct in the right basal ganglia with no associated hemorrhage or mass effect. Superimposed punctate posterior right MCA periventricular white  matter infarct. 2. Negative for emergent large vessel occlusion. Anterior circulation atherosclerosis appears stable since 2014, including at least moderate stenosis of the supraclinoid right ICA (series 705, image 6). 3. Mild progression of posterior circulation atherosclerosis since 2014 including new mild stenosis of the mid basilar artery and moderate stenosis of the right PCA P1 segment. 4. Underlying chronic small vessel ischemia, moderate for age. Electronically Signed   By: Genevie Ann M.D.   On: 12/13/2015 14:33   12-lead ECG SR All prior EKG's in EPIC reviewed with no documented atrial fibrillation  Telemetry SR, occ VPC's, rare couplet, very brief PAT  Assessment and Plan:  1. Cryptogenic stroke The patient presents with cryptogenic stroke.  The patient has a TEE planned for this AM.  I spoke at length with the patient about monitoring for afib with either a 30 day event monitor or an implantable loop recorder.  Risks, benefits, and alteratives to implantable loop recorder were discussed with the patient today.   At this time, the patient is very clear in their decision to proceed with implantable loop recorder.   Wound care was reviewed with the patient (keep incision clean and dry for 3 days).   Please call with questions.   Chanetta Marshall, NP 12/16/2015 7:29 PM   I have seen, examined the patient, and reviewed the above assessment and plan.  On exam, RRR. Changes to above are made where necessary.  Will  Proceed with ILR if TEE is unrevealing.  Co Sign: [name], MD 12/17/2015 7:44 AM

## 2015-12-16 NOTE — Progress Notes (Signed)
Physical Therapy Treatment Patient Details Name: Stacey Ramirez MRN: [id number] DOB: 1941-07-03 Today's Date: 12/16/2015    History of Present Illness Patient is a 75 y.o. female with hx of HTN, hypothyroidism, Mitral regurgitation, hypercholesterolemia, dizziness, SOB, aortic stenosis and CVA presented with left sided weakness, MRI-right basal ganglia infarct, Rt MCA infarct.    PT Comments    Patient progressing slowly towards PT goals. Demonstrates some difficulty with carry over from prior sessions but able to recall with verbal cues. Does better in environment with less distractions. Emphasized midline positioning during transitions and standing. Cues for gait training to progress LLE and for weight shift. Eager to get to rehab. Will follow.  Follow Up Recommendations  CIR     Equipment Recommendations  Other (comment) (TBA)    Recommendations for Other Services       Precautions / Restrictions Precautions Precautions: Fall Restrictions Weight Bearing Restrictions: No    Mobility  Bed Mobility Overal bed mobility: Needs Assistance Bed Mobility: Supine to Sit     Supine to sit: Min guard     General bed mobility comments: Min guard for safety.   Transfers Overall transfer level: Needs assistance Equipment used: None Transfers: Sit to/from Stand Sit to Stand: Min assist         General transfer comment: MIn A to boost from EOB with cues for foot/hand placement, anterior translation and upright posture. Posterior bias. Cues for slow descent onto surface. transferred to chair post ambulation.  Ambulation/Gait Ambulation/Gait assistance: Min assist Ambulation Distance (Feet): 120 Feet Assistive device: 1 person hand held assist (rail) Gait Pattern/deviations: Step-to pattern;Step-through pattern;Decreased stride length;Narrow base of support;Decreased dorsiflexion - left;Decreased weight shift to right Gait velocity: decreased   General Gait Details: Manual cues  for weightshift to the right and for midline positioning as pt with tendency to lean left. Better able to progress LLE today. Does not do well with distractions. Needs cues to stay on task.    Stairs            Wheelchair Mobility    Modified Rankin (Stroke Patients Only) Modified Rankin (Stroke Patients Only) Pre-Morbid Rankin Score: No symptoms Modified Rankin: Moderately severe disability     Balance Overall balance assessment: Needs assistance Sitting-balance support: Feet supported;No upper extremity supported Sitting balance-Leahy Scale: Good Sitting balance - Comments: Focused on midline positioning.   Standing balance support: During functional activity Standing balance-Leahy Scale: Fair Standing balance comment: Able to stand unsupported but requires mirroring to obtain midling positioning.                     Cognition Arousal/Alertness: Awake/alert Behavior During Therapy: WFL for tasks assessed/performed Overall Cognitive Status: Impaired/Different from baseline Area of Impairment: Safety/judgement;Memory     Memory: Decreased short-term memory   Safety/Judgement: Decreased awareness of safety          Exercises Other Exercises Other Exercises: Sit to stand x7 from chair; cues for foot/hand placement and anterior translation/weight shift equally through BUEs/LEs and mirroring therapist to get to midline;controlled descent onto surface    General Comments        Pertinent Vitals/Pain Pain Assessment: Faces Faces Pain Scale: Hurts a little bit Pain Location: headache Pain Descriptors / Indicators: Headache Pain Intervention(s): Monitored during session;Repositioned    Home Living                      Prior Function  PT Goals (current goals can now be found in the care plan section) Progress towards PT goals: Progressing toward goals    Frequency  Min 4X/week    PT Plan Current plan remains appropriate     Co-evaluation             End of Session Equipment Utilized During Treatment: Gait belt Activity Tolerance: Patient tolerated treatment well Patient left: in chair;with call bell/phone within reach;with family/visitor present     Time: 0911-0936 PT Time Calculation (min) (ACUTE ONLY): 25 min  Charges:  $Gait Training: 8-22 mins $Neuromuscular Re-education: 8-22 mins                    G Codes:      Farhana Fellows A Marvine Encalade 12/16/2015, 10:04 AM Wray Kearns, [place], DPT [phone]

## 2015-12-16 NOTE — Progress Notes (Signed)
Occupational Therapy Treatment Patient Details Name: Stacey Ramirez MRN: [id number] DOB: 1941-02-16 Today's Date: 12/16/2015    History of present illness Patient is a 75 y.o. female with hx of HTN, hypothyroidism, Mitral regurgitation, hypercholesterolemia, dizziness, SOB, aortic stenosis and CVA presented with left sided weakness, MRI-right basal ganglia infarct, Rt MCA infarct.   OT comments  Pt progressing. Continue to recommend CIR for rehab. Feel pt will continue to benefit from acute OT to increase independence prior to d/c.   Follow Up Recommendations  CIR    Equipment Recommendations  Other (comment) (defer to next venue)    Recommendations for Other Services Rehab consult    Precautions / Restrictions Precautions Precautions: Fall Restrictions Weight Bearing Restrictions: No       Mobility Bed Mobility Overal bed mobility: Needs Assistance Bed Mobility: Sit to Supine       Sit to supine: Supervision (however, assist given to scoot HOB)      Transfers Overall transfer level: Needs assistance   Transfers: Sit to/from Stand Sit to Stand: Min assist         General transfer comment: assist to boost to stand.     Balance    Pt leaning to left while standing at sink-OT assisted.                               ADL Overall ADL's : Needs assistance/impaired     Grooming: Minimal assistance;Brushing hair;Applying deodorant;Oral care;Wash/dry face;Standing (also dryed hands)   Upper Body Bathing: Min guard;Standing Upper Body Bathing Details (indicate cue type and reason): washed armpits     Upper Body Dressing : Minimal assistance;Standing       Toilet Transfer: Minimal assistance;Ambulation (sit to stand from transport chair)           Functional mobility during ADLs: Minimal assistance General ADL Comments: Pt using LUE functionally in session.       Vision                     Perception     Praxis      Cognition   Awake/Alert Behavior During Therapy: WFL for tasks assessed/performed;Flat affect Overall Cognitive Status: Within Functional Limits for tasks assessed                       Extremity/Trunk Assessment               Exercises     Shoulder Instructions       General Comments      Pertinent Vitals/ Pain       Pain Assessment: No/denies pain  Home Living                                          Prior Functioning/Environment              Frequency Min 2X/week     Progress Toward Goals  OT Goals(current goals can now be found in the care plan section)  Progress towards OT goals: Progressing toward goals-added two goals  Acute Rehab OT Goals Patient Stated Goal: wanted to brush her teeth OT Goal Formulation: With patient Time For Goal Achievement: 12/21/15 Potential to Achieve Goals: Good ADL Goals Pt Will Perform Grooming: with set-up;with supervision;standing Pt Will Perform Upper Body Bathing: with  set-up;with supervision;standing Pt Will Perform Upper Body Dressing: with set-up;sitting Pt Will Perform Lower Body Dressing: with min guard assist;sit to/from stand Pt Will Transfer to Toilet: with min guard assist;ambulating;bedside commode (with RW) Pt Will Perform Toileting - Clothing Manipulation and hygiene: with min guard assist;sit to/from stand Additional ADL Goal #1: Pt will independently perform HEP for bilateral UEs to increase strength and coordination.  Plan Discharge plan remains appropriate    Co-evaluation                 End of Session Equipment Utilized During Treatment: Gait belt   Activity Tolerance Patient tolerated treatment well   Patient Left in bed;with call bell/phone within reach;with bed alarm set;with family/visitor present   Nurse Communication          Time: [phone] (short period of time spent going to get ADL items) OT Time Calculation (min): 19 min  Charges: OT General Charges $OT  Visit: 1 Procedure OT Treatments $Self Care/Home Management : 8-22 mins  Benito Mccreedy OTR/L [id number] 12/16/2015, 4:55 PM

## 2015-12-16 NOTE — Progress Notes (Signed)
STROKE TEAM PROGRESS NOTE   HISTORY OF PRESENT ILLNESS Stacey Ramirez is a 75 y.o. female who presents with left-sided weakness that started sometime between when she went to bed at 11:30 when she woke up at 2:30. She presented subsequent later to the emergency room and at that point was outside any IV or IA interventional windows and therefore no code stroke was called. She denies numbness.  An MRI was performed in the emergency department which demonstrates right basal ganglia infarct.   LKW: 11:30 PM 5/11 tpa given?: no, outside of window   SUBJECTIVE (INTERVAL HISTORY) The patient's husband was at the bedside. The patient feels a little bit weaker today although she did receive Xanax last night and may be tired from the effects of the medication. TEE hass been scheduled for tomorrow.   OBJECTIVE Temp:  [97.7 F (36.5 C)-98.6 F (37 C)] 97.7 F (36.5 C) (05/15 1024) Pulse Rate:  [66-69] 69 (05/15 1024) Cardiac Rhythm:  [-] Sinus bradycardia (05/15 0707) Resp:  [18-20] 18 (05/15 1024) BP: (135-190)/(68-88) 148/75 mmHg (05/15 1024) SpO2:  [96 %-98 %] 96 % (05/15 1024)  CBC:   Recent Labs Lab 12/13/15 0927 12/13/15 0936 12/15/15 0545  WBC 7.5  --  6.0  NEUTROABS 5.7  --  2.8  HGB 13.2 14.6 13.5  HCT 40.2 43.0 41.6  MCV 93.1  --  96.1  PLT 210  --  [id number]    Basic Metabolic Panel:   Recent Labs Lab 12/13/15 0927 12/13/15 0936 12/15/15 0545  NA 141 141 143  K 3.7 3.6 3.5  CL 106 107 107  CO2 23  --  27  GLUCOSE 119* 113* 91  BUN 13 15 12   CREATININE 0.63 0.50 0.66  CALCIUM 9.4  --  9.3  MG  --   --  2.2    Lipid Panel:     Component Value Date/Time   CHOL 315* 12/13/2015 1558   CHOL 331* 04/14/2013 0921   TRIG 59 12/13/2015 1558   HDL 65 12/13/2015 1558   HDL 65 04/14/2013 0921   CHOLHDL 4.8 12/13/2015 1558   CHOLHDL 5.1* 04/14/2013 0921   VLDL 12 12/13/2015 1558   LDLCALC 238* 12/13/2015 1558   LDLCALC 244* 04/14/2013 0921   HgbA1c:  Lab Results   Component Value Date   HGBA1C 5.7* 12/13/2015   Urine Drug Screen: No results found for: LABOPIA, COCAINSCRNUR, LABBENZ, AMPHETMU, THCU, LABBARB    IMAGING I have personally reviewed the radiological images below and agree with the radiology interpretations.  Dg Chest 2 View 12/13/2015   No edema or consolidation.   Ct Head Wo Contrast 12/13/2015   No acute abnormality. Atrophy and chronic microvascular ischemic change.   Mri and Mra Head/brain Wo Cm 12/13/2015   1. Confluent acute infarct in the right basal ganglia with no associated hemorrhage or mass effect. Superimposed punctate posterior right MCA periventricular white matter infarct.  2. Negative for emergent large vessel occlusion. Anterior circulation atherosclerosis appears stable since 2014, including at least moderate stenosis of the supraclinoid right ICA (series 705, image 6).  3. Mild progression of posterior circulation atherosclerosis since 2014 including new mild stenosis of the mid basilar artery and moderate stenosis of the right PCA P1 segment.  4. Underlying chronic small vessel ischemia, moderate for age.   LE venous doppler - Lower extremity venous duplex has been completed. Preliminary findings: No evidence of DVT or baker's cyst.  CUS - Bilateral: 1-39% ICA stenosis. Vertebral artery  flow is antegrade.  TTE - Left ventricle: The cavity size was normal. Wall thickness was  increased in a pattern of mild LVH. Systolic function was normal.  The estimated ejection fraction was in the range of 60% to 65%.  Wall motion was normal; there were no regional wall motion  abnormalities. Doppler parameters are consistent with abnormal  left ventricular relaxation (grade 1 diastolic dysfunction). - Aortic valve: There was no stenosis. - Mitral valve: There was no significant regurgitation. - Right ventricle: The cavity size was normal. Systolic function  was normal. - Pulmonary arteries: No complete TR doppler  jet so unable to  estimate PA systolic pressure. - Inferior vena cava: The vessel was normal in size. The  respirophasic diameter changes were in the normal range (>= 50%),  consistent with normal central venous pressure. Impressions: - Normal LV size with mild LV hypertrophy. EF 60-65%. Normal RV  size and systolic function. No significant valvular  abnormalities.   PHYSICAL EXAM  Physical exam: Exam: Gen: NAD         CV: RRR, no MRG. No Carotid Bruits. No peripheral edema, warm, nontender Eyes: Conjunctivae clear without exudates or hemorrhage  Neuro: Detailed Neurologic Exam  Speech:    Speech is normal; with normal comprehension.  Cognition:    The patient is oriented to person, place, and time;    Cranial Nerves:    The pupils are equal, round, and reactive to light. The fundi are normal and spontaneous venous pulsations are present. Visual fields are full to finger confrontation. Extraocular movements are intact. Trigeminal sensation is intact and the muscles of mastication are normal. Lower left sided facial weakness. The palate elevates in the midline. Hearing intact to voice. Voice is normal. Shoulder shrug is normal. The tongue has normal motion without fasciculations.   Coordination:    No dysmetria, left-sided weakness   Motor Observation:    No asymmetry, no atrophy, and no involuntary movements noted. Tone:    Normal muscle tone.    Posture:    Posture is normal. normal erect    Strength: Left-sided mild weakness of the UE and LE 4/5 otherwise strength is V/V in the upper and lower limbs.      Sensation: intact to LT  Gait: patient able to stand and walk independently. Stooped posture, left hemiparetic gait with small cautious steps with physical therapy.     ASSESSMENT/PLAN [name] is a 75 y.o. female with history of hypertension, hyperlipidemia, hypothyroidism, aortic stenosis, mitral regurgitation, and previous stroke presenting with left  hemiparesis.. She did not receive IV t-PA due to late presentation.  Stroke:  Right large BG and punctate periventricular WM infarcts, possibly embolic from an unknown source  Resultant  Left sided face, arm and leg  MRI  Acute infarct in the right basal ganglia & punctate posterior right MCA periventricular white matter infarcts  MRA  moderate stenosis of the supraclinoid right ICA - moderate stenosis of the right PCA P1 and left P2 segments.   Carotid Doppler - unremarkable  TTE - EF 60-65%  LE venous doppler - no DVT  TEE and loop recorder pending.  LDL 238  HgbA1c 5.7  VTE prophylaxis - Lovenox Diet Heart Room service appropriate?: Yes; Fluid consistency:: Thin  aspirin 81 mg daily prior to admission, now on aspirin 325 mg daily. Due to intracranial stenosis, recommend DAPT with ASA and Plavix for 3 months and then plavix alone.    Patient counseled to be compliant  with her antithrombotic medications  Ongoing aggressive stroke risk factor management  Therapy recommendations:  Possible CIR  Disposition:  Pending  Palpitation  Hx of SVT  30 day cardiac event monitoring negative  Due to recent stroke, would do loop recorder  Hx of stroke   0000000 left PLIC/CR/thalamus infarct  MRA negative   EF 60-65%  30 day monitoring negative for afib  Put on ASA and crestor  Hypertension  Permissive hypertension (OK if < 220/120) but gradually normalize in 5-7 days  stable  Hyperlipidemia  Home meds:  Crestor 2.5 mg daily resumed in hospital  LDL 238, goal < 70  Patient has h/o statin intolerance hence recommend new PCSK9 inhibitor like Praluent or Repathar. Currently on Crestor 10 mg daily.  Continue statin at discharge unless patient develops myalgias.  Other Stroke Risk Factors  Advanced age  ETOH use  Other [facility] day # 2  Rosalin Hawking, MD PhD Stroke Neurology 12/16/2015 10:50 PM    To contact Stroke Continuity  provider, please refer to [email or web address] After hours, contact General Neurology

## 2015-12-16 NOTE — Progress Notes (Signed)
I will initiate insurance authorization with [employer] for a possible inpt rehab admission when medical workup complete. Noted for TEE 5/16. Admission will be  pending approval and bed availability. I will follow up with pt and family 5/16. [phone]

## 2015-12-16 NOTE — Progress Notes (Signed)
Speech Language Pathology Treatment: Cognitive-Linquistic  Patient Details Name: Stacey Ramirez MRN: [id number] DOB: 06-Feb-1941 Today's Date: 12/16/2015 Time: [id number] SLP Time Calculation (min) (ACUTE ONLY): 25 min  Assessment / Plan / Recommendation Clinical Impression  Pt was lethargic during treatment, but pleasant and participative. Pt relays she experienced increased anxiety yesterday evening when she realized the severity of her stroke and required meds for calming. Pt generally oriented x 4. Able to read at the word-sentence level [facility]. Min A for paragraph-level comprehension and mod A for basic reasoning task. Pt was able to verbally sequence a functional kitchen task with mod A of clarifying question for structure and organization. Pt fatigued quickly, but was able to demonstrate sustained attention to task for the entirety of the session. Pt able to recall potential plan for CIR independently. Speech is characterized by mild dysarthria with decreased vocal intensity. Intelligibility at the conversation level with min A. Will continue to follow.     HPI HPI: Patient is a 75 y.o. female with hx of HTN, hypothyroidism, Mitral regurgitation, hypercholesterolemia, dizziness, SOB, aortic stenosis and CVA presented with left sided weakness, MRI-right basal ganglia infarct, Rt MCA infarct.      SLP Plan  Continue with current plan of care     Recommendations                Follow up Recommendations: Inpatient Rehab Plan: Continue with current plan of care     Ste[place] MA, CCC-SLP 12/16/2015, 3:27 PM

## 2015-12-16 NOTE — Progress Notes (Signed)
Rehab Admissions Coordinator Note:  Patient was screened by Cleatrice Burke for appropriateness for an Inpatient Acute Rehab Consult per PT recommendation.  At this time, we are recommending Inpatient Rehab consult.  Cleatrice Burke 12/16/2015, 8:29 AM  I can be reached at [phone].

## 2015-12-16 NOTE — Progress Notes (Signed)
    [facility] has been requested to perform a transesophageal echocardiogram on 05/16 for CVA.  After careful review of history and examination, the risks and benefits of transesophageal echocardiogram have been explained including risks of esophageal damage, perforation (1:10,000 risk), bleeding, pharyngeal hematoma as well as other potential complications associated with conscious sedation including aspiration, arrhythmia, respiratory failure and death. Alternatives to treatment were discussed, questions were answered. Patient is willing to proceed. Pt husband present and in agreement as well.  Lenoard Aden 12/16/2015 3:43 PM

## 2015-12-16 NOTE — Progress Notes (Signed)
OT Cancellation Note  Patient Details Name: KHAMARIA HENES MRN: [id number] DOB: 1940/10/27   Cancelled Treatment:    Reason Eval/Treat Not Completed: Patient at procedure or test/ unavailable  Benito Mccreedy OTR/L [id number] 12/16/2015, 4:22 PM

## 2015-12-16 NOTE — Progress Notes (Signed)
TEE cancelled for today by cardiology. Schedule is full. Plan for tomorrow. Diet ordered.  Mikey Bussing PA-C [facility] Pager [phone] 12/16/2015, 9:46 AM

## 2015-12-17 ENCOUNTER — Inpatient Hospital Stay (HOSPITAL_COMMUNITY)
Admission: RE | Admit: 2015-12-17 | Discharge: 2015-12-25 | DRG: 057 | Disposition: A | Discharge status: Home Health | Payer: PPO | Source: Intra-hospital | Attending: Physical Medicine & Rehabilitation | Admitting: Physical Medicine & Rehabilitation

## 2015-12-17 ENCOUNTER — Inpatient Hospital Stay (HOSPITAL_COMMUNITY): Payer: PPO

## 2015-12-17 ENCOUNTER — Encounter (HOSPITAL_COMMUNITY)
Admission: EM | Disposition: A | Discharge status: Rehabilitation Facility | Payer: Self-pay | Source: Home / Self Care | Attending: Internal Medicine

## 2015-12-17 ENCOUNTER — Encounter (HOSPITAL_COMMUNITY): Payer: Self-pay | Admitting: *Deleted

## 2015-12-17 DIAGNOSIS — H538 Other visual disturbances: Secondary | ICD-10-CM | POA: Diagnosis not present

## 2015-12-17 DIAGNOSIS — E785 Hyperlipidemia, unspecified: Secondary | ICD-10-CM | POA: Diagnosis not present

## 2015-12-17 DIAGNOSIS — I639 Cerebral infarction, unspecified: Secondary | ICD-10-CM

## 2015-12-17 DIAGNOSIS — I69328 Other speech and language deficits following cerebral infarction: Secondary | ICD-10-CM | POA: Diagnosis not present

## 2015-12-17 DIAGNOSIS — F4322 Adjustment disorder with anxiety: Secondary | ICD-10-CM | POA: Diagnosis present

## 2015-12-17 DIAGNOSIS — I10 Essential (primary) hypertension: Secondary | ICD-10-CM | POA: Diagnosis not present

## 2015-12-17 DIAGNOSIS — I63411 Cerebral infarction due to embolism of right middle cerebral artery: Secondary | ICD-10-CM | POA: Diagnosis not present

## 2015-12-17 DIAGNOSIS — I69354 Hemiplegia and hemiparesis following cerebral infarction affecting left non-dominant side: Secondary | ICD-10-CM | POA: Diagnosis not present

## 2015-12-17 DIAGNOSIS — I69398 Other sequelae of cerebral infarction: Secondary | ICD-10-CM | POA: Diagnosis not present

## 2015-12-17 DIAGNOSIS — E039 Hypothyroidism, unspecified: Secondary | ICD-10-CM | POA: Diagnosis present

## 2015-12-17 DIAGNOSIS — Z79899 Other long term (current) drug therapy: Secondary | ICD-10-CM | POA: Diagnosis not present

## 2015-12-17 DIAGNOSIS — I350 Nonrheumatic aortic (valve) stenosis: Secondary | ICD-10-CM

## 2015-12-17 DIAGNOSIS — Z7902 Long term (current) use of antithrombotics/antiplatelets: Secondary | ICD-10-CM

## 2015-12-17 DIAGNOSIS — I080 Rheumatic disorders of both mitral and aortic valves: Secondary | ICD-10-CM | POA: Diagnosis not present

## 2015-12-17 DIAGNOSIS — K5900 Constipation, unspecified: Secondary | ICD-10-CM

## 2015-12-17 DIAGNOSIS — G8194 Hemiplegia, unspecified affecting left nondominant side: Secondary | ICD-10-CM | POA: Diagnosis not present

## 2015-12-17 DIAGNOSIS — F419 Anxiety disorder, unspecified: Secondary | ICD-10-CM | POA: Diagnosis not present

## 2015-12-17 DIAGNOSIS — R269 Unspecified abnormalities of gait and mobility: Secondary | ICD-10-CM | POA: Diagnosis not present

## 2015-12-17 DIAGNOSIS — I340 Nonrheumatic mitral (valve) insufficiency: Secondary | ICD-10-CM

## 2015-12-17 DIAGNOSIS — Z7982 Long term (current) use of aspirin: Secondary | ICD-10-CM | POA: Diagnosis not present

## 2015-12-17 DIAGNOSIS — G44219 Episodic tension-type headache, not intractable: Secondary | ICD-10-CM | POA: Diagnosis not present

## 2015-12-17 HISTORY — PX: TEE WITHOUT CARDIOVERSION: SHX5443

## 2015-12-17 HISTORY — PX: EP IMPLANTABLE DEVICE: SHX172B

## 2015-12-17 LAB — CBC
HEMATOCRIT: 41 % (ref 36.0–46.0)
HEMOGLOBIN: 13.6 g/dL (ref 12.0–15.0)
MCH: 31.2 pg (ref 26.0–34.0)
MCHC: 33.2 g/dL (ref 30.0–36.0)
MCV: 94 fL (ref 78.0–100.0)
Platelets: 219 10*3/uL (ref 150–400)
RBC: 4.36 MIL/uL (ref 3.87–5.11)
RDW: 12.7 % (ref 11.5–15.5)
WBC: 5.8 10*3/uL (ref 4.0–10.5)

## 2015-12-17 LAB — BASIC METABOLIC PANEL
Anion gap: 8 (ref 5–15)
BUN: 10 mg/dL (ref 6–20)
CHLORIDE: 104 mmol/L (ref 101–111)
CO2: 29 mmol/L (ref 22–32)
Calcium: 9.6 mg/dL (ref 8.9–10.3)
Creatinine, Ser: 0.79 mg/dL (ref 0.44–1.00)
GFR calc Af Amer: >60 mL/min (ref >60)
GFR calc non Af Amer: >60 mL/min (ref >60)
GLUCOSE: 105 mg/dL — AB (ref 65–99)
POTASSIUM: 4 mmol/L (ref 3.5–5.1)
Sodium: 141 mmol/L (ref 135–145)

## 2015-12-17 SURGERY — ECHOCARDIOGRAM, TRANSESOPHAGEAL
Anesthesia: Moderate Sedation

## 2015-12-17 SURGERY — LOOP RECORDER INSERTION

## 2015-12-17 MED ORDER — MIDAZOLAM HCL 5 MG/ML IJ SOLN
INTRAMUSCULAR | Status: AC
  Filled 2015-12-17: qty 2

## 2015-12-17 MED ORDER — MIDAZOLAM HCL 10 MG/2ML IJ SOLN
INTRAMUSCULAR | Status: DC | PRN
  Administered 2015-12-17: 2 mg via INTRAVENOUS

## 2015-12-17 MED ORDER — SENNOSIDES-DOCUSATE SODIUM 8.6-50 MG PO TABS: 1.0000 | ORAL_TABLET | Freq: Every evening | ORAL | Status: DC | PRN

## 2015-12-17 MED ORDER — DIPHENHYDRAMINE HCL 50 MG/ML IJ SOLN
INTRAMUSCULAR | Status: AC
  Filled 2015-12-17: qty 1

## 2015-12-17 MED ORDER — LOTEPREDNOL ETABONATE 0.5 % OP SUSP
1.0000 [drp] | Freq: Four times a day (QID) | OPHTHALMIC | Status: DC
  Administered 2015-12-18 – 2015-12-20 (×4): 1 [drp] via OPHTHALMIC
  Filled 2015-12-17: qty 5

## 2015-12-17 MED ORDER — METOPROLOL TARTRATE 5 MG/5ML IV SOLN
INTRAVENOUS | Status: AC
  Filled 2015-12-17: qty 5

## 2015-12-17 MED ORDER — PROCHLORPERAZINE 25 MG RE SUPP: 12.5000 mg | Freq: Four times a day (QID) | RECTAL | Status: DC | PRN

## 2015-12-17 MED ORDER — TRAZODONE HCL 50 MG PO TABS: 25.0000 mg | ORAL_TABLET | Freq: Every evening | ORAL | Status: DC | PRN

## 2015-12-17 MED ORDER — CLOPIDOGREL BISULFATE 75 MG PO TABS: 75.0000 mg | ORAL_TABLET | Freq: Every day | ORAL | Status: DC

## 2015-12-17 MED ORDER — METOPROLOL TARTRATE 25 MG PO TABS: 12.5000 mg | ORAL_TABLET | Freq: Two times a day (BID) | ORAL | Status: DC

## 2015-12-17 MED ORDER — ENOXAPARIN SODIUM 40 MG/0.4ML ~~LOC~~ SOLN
40.0000 mg | SUBCUTANEOUS | Status: DC
  Administered 2015-12-18 – 2015-12-24 (×7): 40 mg via SUBCUTANEOUS
  Filled 2015-12-17 (×7): qty 0.4

## 2015-12-17 MED ORDER — ALUM & MAG HYDROXIDE-SIMETH 200-200-20 MG/5ML PO SUSP: 30.0000 mL | ORAL | Status: DC | PRN

## 2015-12-17 MED ORDER — DIPHENHYDRAMINE HCL 12.5 MG/5ML PO ELIX: 12.5000 mg | ORAL_SOLUTION | Freq: Four times a day (QID) | ORAL | Status: DC | PRN

## 2015-12-17 MED ORDER — SODIUM CHLORIDE 0.9 % IV SOLN: INTRAVENOUS | Status: DC

## 2015-12-17 MED ORDER — METOPROLOL TARTRATE 12.5 MG HALF TABLET
12.5000 mg | ORAL_TABLET | Freq: Two times a day (BID) | ORAL | Status: DC
  Administered 2015-12-17 – 2015-12-25 (×15): 12.5 mg via ORAL
  Filled 2015-12-17 (×16): qty 1

## 2015-12-17 MED ORDER — ROSUVASTATIN CALCIUM 10 MG PO TABS
10.0000 mg | ORAL_TABLET | Freq: Every day | ORAL | Status: DC
  Administered 2015-12-18 – 2015-12-24 (×7): 10 mg via ORAL
  Filled 2015-12-17 (×7): qty 1

## 2015-12-17 MED ORDER — ALPRAZOLAM 0.25 MG PO TABS
0.2500 mg | ORAL_TABLET | Freq: Every day | ORAL | Status: DC | PRN
Start: 2015-12-17 — End: 2015-12-19

## 2015-12-17 MED ORDER — FENTANYL CITRATE (PF) 100 MCG/2ML IJ SOLN
INTRAMUSCULAR | Status: DC | PRN
  Administered 2015-12-17: 25 ug via INTRAVENOUS

## 2015-12-17 MED ORDER — LIDOCAINE-EPINEPHRINE 1 %-1:100000 IJ SOLN
INTRAMUSCULAR | Status: DC | PRN
  Administered 2015-12-17: 10 mL via INTRADERMAL

## 2015-12-17 MED ORDER — GUAIFENESIN-DM 100-10 MG/5ML PO SYRP
5.0000 mL | ORAL_SOLUTION | Freq: Four times a day (QID) | ORAL | Status: DC | PRN
Start: 2015-12-17 — End: 2015-12-25

## 2015-12-17 MED ORDER — PROCHLORPERAZINE MALEATE 5 MG PO TABS: 5.0000 mg | ORAL_TABLET | Freq: Four times a day (QID) | ORAL | Status: DC | PRN

## 2015-12-17 MED ORDER — ACETAMINOPHEN 325 MG PO TABS
325.0000 mg | ORAL_TABLET | ORAL | Status: DC | PRN
  Administered 2015-12-20 – 2015-12-22 (×3): 650 mg via ORAL
  Filled 2015-12-17 (×3): qty 2

## 2015-12-17 MED ORDER — BUTAMBEN-TETRACAINE-BENZOCAINE 2-2-14 % EX AERO
INHALATION_SPRAY | CUTANEOUS | Status: DC | PRN
  Administered 2015-12-17: 2 via TOPICAL

## 2015-12-17 MED ORDER — LIDOCAINE-EPINEPHRINE 1 %-1:100000 IJ SOLN
INTRAMUSCULAR | Status: AC
  Filled 2015-12-17: qty 1

## 2015-12-17 MED ORDER — METOPROLOL TARTRATE 5 MG/5ML IV SOLN
INTRAVENOUS | Status: DC | PRN
  Administered 2015-12-17: 5 mg via INTRAVENOUS

## 2015-12-17 MED ORDER — FLEET ENEMA 7-19 GM/118ML RE ENEM: 1.0000 | ENEMA | Freq: Once | RECTAL | Status: DC | PRN

## 2015-12-17 MED ORDER — POLYETHYLENE GLYCOL 3350 17 G PO PACK
17.0000 g | PACK | Freq: Every day | ORAL | Status: DC
  Administered 2015-12-18 – 2015-12-23 (×4): 17 g via ORAL
  Filled 2015-12-17 (×8): qty 1

## 2015-12-17 MED ORDER — ASPIRIN EC 325 MG PO TBEC
325.0000 mg | DELAYED_RELEASE_TABLET | Freq: Every day | ORAL | Status: DC
  Administered 2015-12-18 – 2015-12-25 (×8): 325 mg via ORAL
  Filled 2015-12-17 (×8): qty 1

## 2015-12-17 MED ORDER — ALUM & MAG HYDROXIDE-SIMETH 200-200-20 MG/5ML PO SUSP
15.0000 mL | ORAL | Status: DC | PRN
  Administered 2015-12-17 – 2015-12-21 (×6): 15 mL via ORAL
  Filled 2015-12-17 (×6): qty 30

## 2015-12-17 MED ORDER — POLYETHYLENE GLYCOL 3350 17 G PO PACK: 17.0000 g | PACK | Freq: Every day | ORAL | Status: DC

## 2015-12-17 MED ORDER — ESTRADIOL 0.1 MG/GM VA CREA: 2.0000 g | TOPICAL_CREAM | VAGINAL | Status: DC | PRN

## 2015-12-17 MED ORDER — PROCHLORPERAZINE EDISYLATE 5 MG/ML IJ SOLN: 5.0000 mg | Freq: Four times a day (QID) | INTRAMUSCULAR | Status: DC | PRN

## 2015-12-17 MED ORDER — CLOPIDOGREL BISULFATE 75 MG PO TABS
75.0000 mg | ORAL_TABLET | Freq: Every day | ORAL | Status: DC
  Administered 2015-12-18 – 2015-12-25 (×8): 75 mg via ORAL
  Filled 2015-12-17 (×8): qty 1

## 2015-12-17 MED ORDER — BISACODYL 10 MG RE SUPP: 10.0000 mg | Freq: Every day | RECTAL | Status: DC | PRN

## 2015-12-17 MED ORDER — FENTANYL CITRATE (PF) 100 MCG/2ML IJ SOLN
INTRAMUSCULAR | Status: AC
  Filled 2015-12-17: qty 2

## 2015-12-17 MED ORDER — LEVOTHYROXINE SODIUM 25 MCG PO TABS
25.0000 ug | ORAL_TABLET | Freq: Every day | ORAL | Status: DC
  Administered 2015-12-18 – 2015-12-25 (×8): 25 ug via ORAL
  Filled 2015-12-17 (×8): qty 1

## 2015-12-17 MED ORDER — ASPIRIN 325 MG PO TBEC: 325.0000 mg | DELAYED_RELEASE_TABLET | Freq: Every day | ORAL | Status: DC

## 2015-12-17 SURGICAL SUPPLY — 2 items
LOOP REVEAL LINQSYS (Prosthesis & Implant Heart) ×3 IMPLANT
PACK LOOP INSERTION (CUSTOM PROCEDURE TRAY) ×3 IMPLANT

## 2015-12-17 NOTE — H&P (View-Only) (Signed)
ELECTROPHYSIOLOGY CONSULT NOTE  Patient ID: Stacey Ramirez MRN: [id number], DOB/AGE: [birth year]   Admit date: 12/13/2015 Date of Consult: 12/16/2015  Primary Physician: Horatio Pel, MD Primary Cardiologist: Dr. Mare Ferrari (pending new) Reason for Consultation: Cryptogenic stroke ; recommendations regarding Implantable Loop Recorder Requesting MD; Dr. Erlinda Hong  History of Present Illness Stacey Ramirez was admitted on 12/13/2015 with .  PMHx include prior CVA, labile HTN, HLD, hypothyroidism, They first developed symptoms while at home with L leg weakness and facical numbness.  Imaging demonstrated Right large BG and punctate periventricular WM infarcts, possibly embolic from an unknown source.  she has undergone workup for stroke including echocardiogram and carotid dopplers.  The patient has been monitored on telemetry which has demonstrated sinus rhythm with no arrhythmias.  Inpatient stroke work-up is to be completed with a TEE.   Echocardiogram this admission demonstrated   Study Conclusions  - Left ventricle: The cavity size was normal. Wall thickness was  increased in a pattern of mild LVH. Systolic function was normal.  The estimated ejection fraction was in the range of 60% to 65%.  Wall motion was normal; there were no regional wall motion  abnormalities. Doppler parameters are consistent with abnormal  left ventricular relaxation (grade 1 diastolic dysfunction). - Aortic valve: There was no stenosis. - Mitral valve: There was no significant regurgitation. - Right ventricle: The cavity size was normal. Systolic function  was normal. - Pulmonary arteries: No complete TR doppler jet so unable to  estimate PA systolic pressure. - Inferior vena cava: The vessel was normal in size. The  respirophasic diameter changes were in the normal range (>= 50%),  consistent with normal central venous pressure.  Impressions:  - Normal LV size with mild LV hypertrophy. EF  60-65%. Normal RV  size and systolic function. No significant valvular  abnormalities.    Lab work is reviewed.  Prior to admission, the patient denies chest pain, shortness of breath, she has had over the years infrequent palpitations, and unassociated fleeting "spells" of feeling lightheaded, no syncope.  They are recovering from their stroke with disposition pending at discharge.  Historically 2014 has worn 30 day EM, only one page noted, SR, very brief PAT EP has been asked to evaluate for placement of an implantable loop recorder to monitor for atrial fibrillation.     Past Medical History  Diagnosis Date  . Hypertension     LIABLE  . Hypercholesterolemia   . Hypothyroidism   . Vitamin D deficiency   . Palpitations   . History of dizziness   . LVH (left ventricular hypertrophy)   . Aortic stenosis   . Mitral regurgitation   . SOB (shortness of breath)   . Difficulty walking   . Cerebrovascular accident (stroke) [facility])      Surgical History:  Past Surgical History  Procedure Laterality Date  . Cardiovascular stress test  2002    NORMAL  . Transthoracic echocardiogram  2008    SHOWED LEFT VENTRICULAR HYPERTROPHY AND MILD AORTIC STENOSIS     Prescriptions prior to admission  Medication Sig Dispense Refill Last Dose  . ALPRAZolam (XANAX) 0.25 MG tablet Take 1 tablet (0.25 mg total) by mouth daily as needed for anxiety (anxiety). 30 tablet 1 12/12/2015 at Unknown time  . amLODipine (NORVASC) 2.5 MG tablet Take 2.5 mg by mouth every evening.    12/12/2015 at Unknown time  . aspirin 81 MG tablet Take 81 mg by mouth daily.   12/12/2015 at Unknown  time  . estradiol (ESTRACE) 0.1 MG/GM vaginal cream Place 2 g vaginally as needed (dryness).    12/12/2015 at Unknown time  . levothyroxine (SYNTHROID, LEVOTHROID) 25 MCG tablet Take 25 mcg by mouth daily before breakfast.   6 12/13/2015 at Unknown time  . LOTEMAX 0.5 % ophthalmic suspension Place 1 drop into both eyes 4 (four)  times daily.    12/12/2015 at Unknown time  . rosuvastatin (CRESTOR) 10 MG tablet Take 10 mg by mouth as directed. 1/4 tablet every other day   12/12/2015 at Unknown time  . trimethoprim (TRIMPEX) 100 MG tablet Take 100 mg by mouth daily as needed (infection).    Past Month at Unknown time  . valsartan (DIOVAN) 320 MG tablet Take 320 mg by mouth daily.   2 12/12/2015 at Unknown time  . diltiazem 2 % GEL Apply 1 application topically 2 (two) times daily. (Patient not taking: Reported on 12/13/2015) 30 g 3 Not Taking at Unknown time  . hydrochlorothiazide (MICROZIDE) 12.5 MG capsule 1 Capsule on Monday, Wednesday, AND Friday ONLY (Patient not taking: Reported on 12/13/2015) 30 capsule 5 Not Taking at Unknown time    Inpatient Medications:  .  stroke: mapping our early stages of recovery book   Does not apply Once  . clopidogrel  75 mg Oral Daily  . enoxaparin (LOVENOX) injection  40 mg Subcutaneous Q24H  . levothyroxine  25 mcg Oral QAC breakfast  . loteprednol  1 drop Both Eyes QID  . metoprolol tartrate  12.5 mg Oral BID  . polyethylene glycol  17 g Oral Daily  . rosuvastatin  10 mg Oral q1800    Allergies:  Allergies  Allergen Reactions  . Sulfa Drugs Cross Reactors Itching  . Zocor [Simvastatin] Other (See Comments)    Feels like she is going to pass out, weakness  . Micardis [Telmisartan] Other (See Comments)    Feels like she is going to pass out    Social History   Social History  . Marital Status: Married    Spouse Name: N/A  . Number of Children: 2  . Years of Education: college   Occupational History  . retired    Social History Main Topics  . Smoking status: Never Smoker   . Smokeless tobacco: Not on file  . Alcohol Use: Yes  . Drug Use: No  . Sexual Activity: Not on file   Other Topics Concern  . Not on file   Social History Narrative     Family History  Problem Relation Age of Onset  . Heart attack Mother   . Alzheimer's disease Mother       Review of  Systems: All other systems reviewed and are otherwise negative except as noted above.  Physical Exam: Filed Vitals:   12/16/15 0308 12/16/15 0533 12/16/15 1024 12/16/15 1808  BP: 143/77 135/79 148/75 131/57  Pulse:  68 69 64  Temp:  98.3 F (36.8 C) 97.7 F (36.5 C) 98.4 F (36.9 C)  TempSrc:  Oral Oral Oral  Resp:  20 18 18   Height:      Weight:      SpO2:  98% 96% 97%    GEN- The patient is well appearing, alert and oriented x 3 today.   Head- normocephalic, atraumatic Eyes-  Sclera clear, conjunctiva pink Ears- hearing intact Oropharynx- clear Neck- supple Lungs- Clear to ausculation bilaterally, normal work of breathing Heart- Regular rate and rhythm, no murmurs, rubs or gallops  GI- soft, NT Extremities-  no clubbing, cyanosis, or edema MS- no significant deformity or atrophy Skin- no rash or lesion Psych- euthymic mood, full affect   Labs:   Lab Results  Component Value Date   WBC 6.0 12/15/2015   HGB 13.5 12/15/2015   HCT 41.6 12/15/2015   MCV 96.1 12/15/2015   PLT 207 12/15/2015    Recent Labs Lab 12/15/15 0545  NA 143  K 3.5  CL 107  CO2 27  BUN 12  CREATININE 0.66  CALCIUM 9.3  PROT 6.3*  BILITOT 1.0  ALKPHOS 48  ALT 19  AST 20  GLUCOSE 91     Radiology/Studies: Dg Chest 2 View 12/13/2015  CLINICAL DATA:  Dizziness and weakness.  Hypertension. EXAM: CHEST  2 VIEW COMPARISON:  None. FINDINGS: There is no edema or consolidation. Heart size and pulmonary vascularity are normal. There is atherosclerotic calcification in the aorta. No adenopathy. No bone lesions. IMPRESSION: No edema or consolidation. Electronically Signed   By: Lowella Grip III M.D.   On: 12/13/2015 10:43   Ct Head Wo Contrast 12/13/2015  CLINICAL DATA:  Onset of inability to walk this morning. Initial encounter. No known injury. EXAM: CT HEAD WITHOUT CONTRAST TECHNIQUE: Contiguous axial images were obtained from the base of the skull through the vertex without intravenous  contrast. COMPARISON:  None. FINDINGS: There is cortical atrophy and chronic microvascular ischemic change. No evidence of acute intracranial abnormality including hemorrhage, infarct, mass lesion, mass effect or abnormal extra-axial fluid collection is identified. No hydrocephalus or pneumocephalus. The calvarium is intact. Imaged paranasal sinuses and mastoid air cells are clear. IMPRESSION: No acute abnormality. Atrophy and chronic microvascular ischemic change. Electronically Signed   By: Inge Rise M.D.   On: 12/13/2015 10:41   Mr Brain Wo Contrast 12/13/2015  CLINICAL DATA:  75 year old female with slurred speech, difficulty walking and facial droop symptom onset today. Initial encounter. EXAM: MRI HEAD WITHOUT CONTRAST MRA HEAD WITHOUT CONTRAST TECHNIQUE: Multiplanar, multiecho pulse sequences of the brain and surrounding structures were obtained without intravenous contrast. Angiographic images of the head were obtained using MRA technique without contrast. COMPARISON:  Head CT without contrast 0955 hours today. Head and neck MRA a 04/19/2013. FINDINGS: MRI HEAD FINDINGS Major intracranial vascular flow voids are preserved. Confluent 3.5 cm area of restricted diffusion in the right basal ganglia. Minimal to mild associated T2 and FLAIR hyperintensity. No associated hemorrhage. Mild regional mass effect, no significant intracranial mass effect. There is a superimposed punctate area of periventricular white matter restricted diffusion at the atrium on the right (series 8, image 11). No contralateral or posterior fossa restricted diffusion. Superimposed small chronic lacunar infarcts in both cerebellar hemispheres an the thalamus. Increased perivascular spaces throughout the basal ganglia. Patchy mostly posterior hemisphere nonspecific cerebral white matter T2 and FLAIR hyperintensity. Some of these white matter changes ppm most resemble chronic lacunar infarcts. No supratentorial cortical  encephalomalacia identified. There are scattered chronic micro hemorrhages in the temporal lobes and cerebellum. No midline shift, evidence of mass lesion, ventriculomegaly, extra-axial collection or acute intracranial hemorrhage. Cervicomedullary junction and pituitary are within normal limits. Negative visualized cervical spine. Mildly prominent internal auditory canals with otherwise negative visualized internal auditory structures. Mastoids are clear. Paranasal sinuses are clear. Negative orbit and scalp soft tissues. Visualized bone marrow signal is within normal limits. MRA HEAD FINDINGS Stable antegrade flow in the posterior circulation with fairly codominant distal vertebral arteries. Both PICA origins remain patent. Vertebrobasilar junction remains patent. Mild distal vertebral artery irregularity without stenosis  appears stable. Mid basilar artery irregularity with up to mild stenosis has mildly progressed. SCA and PCA origins remain patent. There is mild to moderate stenosis in the right PCA P1 segment which is new. Bilateral PCA branches are stable. Posterior communicating arteries are diminutive or absent. Stable antegrade flow in both ICA siphons and distal cervical ICAs. Bilateral siphon atherosclerosis and irregularity. At least moderate stenosis just distal to the right ICA anterior genu appears stable (series 705, image 6). Normal ophthalmic artery origins. Patent carotid termini. The right MCA origin and M1 segment remain patent. Mild M1 irregularity has not significantly changed. Right MCA bifurcation remains patent. Visualized right MCA branches appear stable with mild distal M2 and M3 branch irregularity. No right MCA branch occlusion identified. Bilateral ACA and left MCA branches are stable with mild irregularity. IMPRESSION: 1. Confluent acute infarct in the right basal ganglia with no associated hemorrhage or mass effect. Superimposed punctate posterior right MCA periventricular white  matter infarct. 2. Negative for emergent large vessel occlusion. Anterior circulation atherosclerosis appears stable since 2014, including at least moderate stenosis of the supraclinoid right ICA (series 705, image 6). 3. Mild progression of posterior circulation atherosclerosis since 2014 including new mild stenosis of the mid basilar artery and moderate stenosis of the right PCA P1 segment. 4. Underlying chronic small vessel ischemia, moderate for age. Electronically Signed   By: Genevie Ann M.D.   On: 12/13/2015 14:33   12-lead ECG SR All prior EKG's in EPIC reviewed with no documented atrial fibrillation  Telemetry SR, occ VPC's, rare couplet, very brief PAT  Assessment and Plan:  1. Cryptogenic stroke The patient presents with cryptogenic stroke.  The patient has a TEE planned for this AM.  I spoke at length with the patient about monitoring for afib with either a 30 day event monitor or an implantable loop recorder.  Risks, benefits, and alteratives to implantable loop recorder were discussed with the patient today.   At this time, the patient is very clear in their decision to proceed with implantable loop recorder.   Wound care was reviewed with the patient (keep incision clean and dry for 3 days).   Please call with questions.   Chanetta Marshall, NP 12/16/2015 7:29 PM   I have seen, examined the patient, and reviewed the above assessment and plan.  On exam, RRR. Changes to above are made where necessary.  Will  Proceed with ILR if TEE is unrevealing.  Co Sign: [name], MD 12/17/2015 7:44 AM

## 2015-12-17 NOTE — Interval H&P Note (Signed)
Stacey Ramirez was admitted today to Inpatient Rehabilitation with the diagnosis of right basal ganglia infarct.  The patient's history has been reviewed, patient examined, and there is no change in status.  Patient continues to be appropriate for intensive inpatient rehabilitation.  I have reviewed the patient's chart and labs.  Questions were answered to the patient's satisfaction. The PAPE has been reviewed and assessment remains appropriate.  Stacey Ramirez T 12/17/2015, 7:50 PM

## 2015-12-17 NOTE — Care Management Important Message (Signed)
Important Message  Patient Details  Name: AMIRA BREARLEY MRN: [id number] Date of Birth: Mar 02, 1941   Medicare Important Message Given:  Yes    Rolm Baptise, RN 12/17/2015, 11:57 AM

## 2015-12-17 NOTE — Interval H&P Note (Signed)
History and Physical Interval Note:  12/17/2015 11:13 AM  Stacey Ramirez  has presented today for surgery, with the diagnosis of stroke  The various methods of treatment have been discussed with the patient and family. After consideration of risks, benefits and other options for treatment, the patient has consented to  Procedure(s): Loop Recorder Insertion (N/A) as a surgical intervention .  The patient's history has been reviewed, patient examined, no change in status, stable for surgery.  I have reviewed the patient's chart and labs.  Questions were answered to the patient's satisfaction.     [name]

## 2015-12-17 NOTE — Progress Notes (Signed)
I have insurance approval to admit pt to inpt rehab today and bed is available. Pt and spouse in agreement. I have notified Dr. Posey Pronto and RN CM. I will make the arrangements for today. [id number]

## 2015-12-17 NOTE — CV Procedure (Signed)
See full TEE report in [employer]; pt sedated with versed 2 mg and fentanyl 25 micrograms IV; normal LV function; negative saline microcavitation study. Stacey Ramirez

## 2015-12-17 NOTE — H&P (Signed)
Physical Medicine and Rehabilitation Admission H&P    Chief Complaint  Patient presents with  . Left sided weakness, left visual field deficits and slurred speech.      HPI:   Stacey Ramirez is a 75 y.o. female with history of HTN, aortic stenosis, L-CVA 2014 with gait disorder and minimal RLE weakness who was admitted on 12/13/15 with left sided weakness and dysarthric speech. MRI/MRA brain Ramirez revealing acute infarct in right basal ganglia with mild progression of PCA atherosclerosis and no large vessel occlusion. 2D echo with EF 60-65% with mild LVH, no aortic or mitral stenosis and no wall abnormality. Carotid dopplers without significant ICA stenosis. ASA changed to plavix for stroked due to small vessel disease and uncontrolled hyperlipidemia as patient intolerant of statins. Crestor increased to 10 mg daily and low dose BB added due to recurrent SVT. Plans for TEE Ramirez 5/16 and showed normal LVF and no PFO. Loop recorder placed by Dr. Rayann Ramirez.   Cognitive evaluation showed dysarthria and cognitive deficits to be at baseline. Patient with resultant LLE weakness with narrow BOS, visual deficits in left upper quadrant as well as delayed processing with slow initiation.    Review of Systems  HENT: Negative for hearing loss.   Eyes: Positive for blurred vision. Negative for photophobia.  Respiratory: Negative for cough, shortness of breath and wheezing.   Cardiovascular: Positive for palpitations (occasional). Negative for chest pain.  Gastrointestinal: Positive for heartburn and constipation. Negative for nausea and diarrhea.       Has had difficulty with swallow for years--has to be out of bed  Musculoskeletal: Negative for myalgias, back pain and joint pain.  Neurological: Positive for dizziness, speech change, focal weakness (Reports was having weakness in RLE prior to admission. ) and headaches.  Psychiatric/Behavioral: Positive for memory loss. The patient is nervous/anxious and has  insomnia.       Past Medical History  Diagnosis Date  . Hypertension     LIABLE  . Hypercholesterolemia   . Hypothyroidism   . Vitamin D deficiency   . Palpitations   . History of dizziness   . LVH (left ventricular hypertrophy)   . Aortic stenosis   . Mitral regurgitation   . SOB (shortness of breath)   . Difficulty walking   . Cerebrovascular accident (stroke) [facility])     Past Surgical History  Procedure Laterality Date  . Cardiovascular stress test  2002    NORMAL  . Transthoracic echocardiogram  2008    SHOWED LEFT VENTRICULAR HYPERTROPHY AND MILD AORTIC STENOSIS  . Ep implantable device N/A 12/17/2015    Procedure: Loop Recorder Insertion;  Surgeon: Stacey Grayer, MD;  Location: [facility];  Service: Cardiovascular;  Laterality: N/A;    Family History  Problem Relation Age of Onset  . Heart attack Mother   . Alzheimer's disease Mother     Social History:  Stacey Ramirez is retired and can assist after discharge. 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 Independent without AD but "slow". She reports that she has never smoked. She does not have any smokeless tobacco history on file. She reports that she drinks a glass of wine daily. She reports that she does not use illicit drugs.   Allergies  Allergen Reactions  . Sulfa Drugs Cross Reactors Itching  . Zocor [Simvastatin] Other (See Comments)    Feels like she is going to pass out, weakness  . Micardis [Telmisartan] Other (See Comments)    Feels  like she is going to pass out    Medications Prior to Admission  Medication Sig Dispense Refill  . ALPRAZolam (XANAX) 0.25 MG tablet Take 1 tablet (0.25 mg total) by mouth daily as needed for anxiety (anxiety). 30 tablet 1  . amLODipine (NORVASC) 2.5 MG tablet Take 2.5 mg by mouth every evening.     Marland Kitchen aspirin EC 325 MG EC tablet Take 1 tablet (325 mg total) by mouth daily. 120 tablet 0  . clopidogrel (PLAVIX) 75 MG tablet Take 1 tablet (75 mg total) by mouth  daily. 30 tablet 0  . diltiazem 2 % GEL Apply 1 application topically 2 (two) times daily. (Patient not taking: Reported on 12/13/2015) 30 g 3  . estradiol (ESTRACE) 0.1 MG/GM vaginal cream Place 2 g vaginally as needed (dryness).     Marland Kitchen levothyroxine (SYNTHROID, LEVOTHROID) 25 MCG tablet Take 25 mcg by mouth daily before breakfast.   6  . LOTEMAX 0.5 % ophthalmic suspension Place 1 drop into both eyes 4 (four) times daily.     . metoprolol tartrate (LOPRESSOR) 25 MG tablet Take 0.5 tablets (12.5 mg total) by mouth 2 (two) times daily. 30 tablet 0  . polyethylene glycol (MIRALAX / GLYCOLAX) packet Take 17 g by mouth daily. 14 each 0  . rosuvastatin (CRESTOR) 10 MG tablet Take 10 mg by mouth as directed. 1/4 tablet every other day    . trimethoprim (TRIMPEX) 100 MG tablet Take 100 mg by mouth daily as needed (infection).     . valsartan (DIOVAN) 320 MG tablet Take 320 mg by mouth daily.   2    Home: Home Living Family/patient expects to be discharged to:: Inpatient rehab Living Arrangements: Spouse/significant other Available Help at Discharge: Family, Available 24 hours/day Type of Home: House Home Access: Stairs to enter [employer] of Steps: 2 Entrance Stairs-Rails: None Home Layout: Two level, Able to live on main level with bedroom/bathroom Alternate Level Stairs-Number of Steps: 1 flight Alternate Level Stairs-Rails: Right, Left Bathroom Shower/Tub: Multimedia programmer: Standard Bathroom Accessibility: Yes Home Equipment: None  Lives With: Spouse   Functional History: Prior Function Level of Independence: Independent Comments: Drives, cooks, cleans.  Functional Status:  Mobility: Bed Mobility Overal bed mobility: Needs Assistance Bed Mobility: Sit to Supine Supine to sit: Min guard Sit to supine: Supervision (however, assist given to scoot HOB) General bed mobility comments: Min guard for safety.  Transfers Overall transfer level: Needs  assistance Equipment used: None Transfers: Sit to/from Stand Sit to Stand: Min assist General transfer comment: assist to boost to stand.  Ambulation/Gait Ambulation/Gait assistance: Min assist Ambulation Distance (Feet): 120 Feet Assistive device: 1 person hand held assist (rail) Gait Pattern/deviations: Step-to pattern, Step-through pattern, Decreased stride length, Narrow base of support, Decreased dorsiflexion - left, Decreased weight shift to right General Gait Details: Manual cues for weightshift to the right and for midline positioning as pt with tendency to lean left. Better able to progress LLE today. Does not do well with distractions. Needs cues to stay on task.  Gait velocity: decreased    ADL: ADL Overall ADL's : Needs assistance/impaired Grooming: Minimal assistance, Brushing hair, Applying deodorant, Oral care, Wash/dry face, Standing (also dryed hands) Upper Body Bathing: Min guard, Standing Upper Body Bathing Details (indicate cue type and reason): washed armpits Lower Body Bathing: Sit to/from stand, Minimal assistance Upper Body Dressing : Minimal assistance, Standing Lower Body Dressing: Minimal assistance, Sit to/from stand Toilet Transfer: Minimal assistance, Ambulation (sit to stand from  transport chair) Functional mobility during ADLs: Minimal assistance General ADL Comments: Pt using LUE functionally in session.   Cognition: Cognition Overall Cognitive Status: Within Functional Limits for tasks assessed Arousal/Alertness: Awake/alert Orientation Level: Oriented X4 Attention: Sustained Sustained Attention: Appears intact Memory: Impaired Memory Impairment: Retrieval deficit, Storage deficit, Decreased recall of new information (scored mod impairment on cognistat) Awareness: Appears intact Problem Solving: Appears intact Safety/Judgment: Appears intact Cognition Arousal/Alertness: Awake/alert Behavior During Therapy: WFL for tasks assessed/performed,  Flat affect Overall Cognitive Status: Within Functional Limits for tasks assessed Area of Impairment: Safety/judgement, Memory Memory: Decreased short-term memory Safety/Judgement: Decreased awareness of safety   Blood pressure 151/60, pulse 69, temperature 98.2 F (36.8 C), temperature source Oral, resp. rate 16, height _0  (1.575 m), weight 52.164 kg (115 lb), SpO2 98 %. Physical Exam  Nursing note and vitals reviewed. Constitutional: She is oriented to person, place, and time. She appears well-developed and well-nourished.  HENT:  Head: Normocephalic and atraumatic.  Right Ear: External ear normal.  Left Ear: External ear normal.  Eyes: Conjunctivae are normal. Pupils are equal, round, and reactive to light.  Neck: Normal range of motion. Neck supple. No tracheal deviation present. No thyromegaly present.  Cardiovascular: Normal rate and regular rhythm.  Exam reveals no friction rub.   Murmur heard. Respiratory: Effort normal and breath sounds normal. No stridor. No respiratory distress. She has no wheezes. She has no rales.  GI: Bowel sounds are normal. She exhibits no distension. There is no tenderness.  Musculoskeletal: She exhibits no edema or tenderness.  Neurological: She is alert and oriented to person, place, and time.  Mild left facial weakness. Soft voice but no dysarthria. Able to follow one and two step commands without difficulty. Reasonable insight and awareness. Motor:  Right upper extremity: 5/5 proximal distal  RLE: 5/5HF,KE and 4 to 4+ ADF/PF Left upper; 4/5 deltoid, bicep, tricep, wrist, hand Left lower extremity and 3+HF, 4-KE 4 ADF/PF DTRs symmetric. No sensory deficits Sensation intact light touch   Skin: Skin is warm and dry. No rash noted. No erythema.  Psychiatric: Thought content normal. Her mood appears anxious. She exhibits abnormal remote memory.    Results for orders placed or performed during the hospital encounter of 12/13/15 (from the past 48  hour(s))  CBC     Status: None   Collection Time: 12/17/15  2:19 AM  Result Value Ref Range   WBC 5.8 4.0 - 10.5 K/uL   RBC 4.36 3.87 - 5.11 MIL/uL   Hemoglobin 13.6 12.0 - 15.0 g/dL   HCT 41.0 36.0 - 46.0 %   MCV 94.0 78.0 - 100.0 fL   MCH 31.2 26.0 - 34.0 pg   MCHC 33.2 30.0 - 36.0 g/dL   RDW 12.7 11.5 - 15.5 %   Platelets 219 150 - 400 K/uL  Basic metabolic panel     Status: Abnormal   Collection Time: 12/17/15  2:19 AM  Result Value Ref Range   Sodium 141 135 - 145 mmol/L   Potassium 4.0 3.5 - 5.1 mmol/L   Chloride 104 101 - 111 mmol/L   CO2 29 22 - 32 mmol/L   Glucose, Bld 105 (H) 65 - 99 mg/dL   BUN 10 6 - 20 mg/dL   Creatinine, Ser 0.79 0.44 - 1.00 mg/dL   Calcium 9.6 8.9 - 10.3 mg/dL   GFR calc non Af Amer >60 >60 mL/min   GFR calc Af Amer >60 >60 mL/min    Comment: (NOTE) The eGFR has  been calculated using the CKD EPI equation. This calculation has not been validated in all clinical situations. eGFR's persistently <60 mL/min signify possible Chronic Kidney Disease.    Anion gap 8 5 - 15   No results found.     Medical Problem List and Plan: 1.  Gait and functional deficits secondary to right basal ganglia infarct 2.  DVT Prophylaxis/Anticoagulation: Pharmaceutical: Lovenox 3. Pain Management:tylenol prn 4. Mood: LCSW to follow for evaluation and support. Xanax prn for anxiety.  5. Neuropsych: This patient is capable of making decisions on her own behalf. 6. Skin/Wound Care: Routine pressure relief measures.  7. Fluids/Electrolytes/Nutrition: Monitor I/O. Check lytes in am. Offer supplements between meals.  8. Dyslipidemia: On Crestor 20 mg daily ( alternate Praluent if unable to tolerate statin) 9. Hypothyroid: On supplement.  10. Aortic stenosis/MR with DOE:  Continue Metoprolol bid.   11. Constipation: On Miralax 12. HTN: monitor BP tid--Norvasc and Diovan on hold at this time to allow for adequate perfusion.       Post Admission Physician  Evaluation: 1. Functional deficits secondary  to right basal ganglia infarct. 2. Patient is admitted to receive collaborative, interdisciplinary care between the physiatrist, rehab nursing staff, and therapy team. 3. Patient's level of medical complexity and substantial therapy needs in context of that medical necessity cannot be provided at a lesser intensity of care such as a SNF. 4. Patient has experienced substantial functional loss from his/her baseline which was documented above under the "Functional History" and "Functional Status" headings.  Judging by the patient's diagnosis, physical exam, and functional history, the patient has potential for functional progress which will result in measurable gains while on inpatient rehab.  These gains will be of substantial and practical use upon discharge  in facilitating mobility and self-care at the household level. 5. Physiatrist will provide 24 hour management of medical needs as well as oversight of the therapy plan/treatment and provide guidance as appropriate regarding the interaction of the two. 6. 24 hour rehab nursing will assist with bladder management, bowel management, safety, skin/wound care, disease management, medication administration, pain management and patient education  and help integrate therapy concepts, techniques,education, etc. 7. PT will assess and treat for/with: Lower extremity strength, range of motion, stamina, balance, functional mobility, safety, adaptive techniques and equipment, NMR, visual-spatial awareness, education, ego support/anxiety mgt.   Goals are: mod I. 8. OT will assess and treat for/with: ADL's, functional mobility, safety, upper extremity strength, adaptive techniques and equipment, NMR, community reintegration, .   Goals are: mod I. Therapy may proceed with showering this patient. 9. SLP will assess and treat for/with: speech/communication.  Goals are: mod I. 10. Case Management and Social Worker will assess  and treat for psychological issues and discharge planning. 11. Team conference will be held weekly to assess progress toward goals and to determine barriers to discharge. 12. Patient will receive at least 3 hours of therapy per day at least 5 days per week. 13. ELOS: 7-9 days       14. Prognosis:  excellent     Meredith Staggers, MD, [facility] 12/17/2015   12/17/2015

## 2015-12-17 NOTE — PMR Pre-admission (Signed)
PMR Admission Coordinator Pre-Admission Assessment  Patient: Stacey Ramirez is an 75 y.o., female MRN: [id number] DOB: 1941/03/03 Height: 5\' 2"  (157.5 cm) Weight: 52.164 kg (115 lb)              Insurance Information HMO:   PPO: yes     PCP:      IPA:      80/20:      OTHER: medicare advantage plan PRIMARY: [employer]      Policy#: [id number]      Subscriber: pt CM Name: Sharyn Lull      Phone#: [phone]     Fax#: [phone] Pre-Cert#: [id number]      Employer: retired approved for 7 days when updates are due 5/23 Benefits:  Phone #: [phone]     Name: 12/17/15 Eff. Date: 08/03/14     Deduct: none      Out of Pocket Max: $3400      Life Max: none CIR: $225 per day days 1-5 then covers 100%      SNF: no copay days 1-20; $150 copay per day days 21-100 Outpatient: $15 copay per visit     Co-Pay: no visit limit Home Health: $25 copay per visit      Co-Pay:  No visit limit DME: 80%     Co-Pay: 20% Providers: in [place]  SECONDARY: none    Medicaid Application Date:       Case Manager:  Disability Application Date:       Case Worker:   Emergency Facilities manager Information    Name Relation Home Work Mobile   Arai,Bryson Spouse [phone]  [phone]     Current Medical History  Patient Admitting Diagnosis: CVA  History of Present Illness: Stacey Ramirez is a 75 y.o. female with history of HTN, aortic stenosis, L-CVA 2014 with gait disorder and minimal RLE weakness who was admitted on 12/13/15 with left sided weakness and dysarthric speech. MRI/MRA brain done revealing acute infarct in right basal ganglia with mild progression of PCA atherosclerosis and no large vessel occlusion. 2D echo with EF 60-65% with mild LVH, no aortic or mitral stenosis and no wall abnormality. Carotid dopplers without significant ICA stenosis. ASA changed to plavix for stroked due to small vessel disease and uncontrolled hyperlipidemia as patient intolerant of statins. Crestor increased to  10 mg daily and low dose BB added due to recurrent SVT. Plans for TEE done 5/16 and showed normal LVF and no PFO.LOOP placed 5/16.Dsarthria and cognitive deficits to be at baseline. Patient with resultant LLE weakness with narrow BOS, visual deficits in left upper quadrant as well as delayed processing with slow initiation.   Total: 2 NIH    Past Medical History  Past Medical History  Diagnosis Date  . Hypertension     LIABLE  . Hypercholesterolemia   . Hypothyroidism   . Vitamin D deficiency   . Palpitations   . History of dizziness   . LVH (left ventricular hypertrophy)   . Aortic stenosis   . Mitral regurgitation   . SOB (shortness of breath)   . Difficulty walking   . Cerebrovascular accident (stroke) [facility])     Family History  family history includes Alzheimer's disease in her mother; Heart attack in her mother.  Prior Rehab/Hospitalizations:  Has the patient had major surgery during 100 days prior to admission? No  Current Medications   Current facility-administered medications:  .   stroke: mapping our early stages of recovery book, , Does not  apply, Once, Radene Gunning, NP .  ALPRAZolam Duanne Moron) tablet 0.25 mg, 0.25 mg, Oral, Daily PRN, Radene Gunning, NP, 0.25 mg at 12/16/15 0211 .  alum & mag hydroxide-simeth (MAALOX/MYLANTA) 200-200-20 MG/5ML suspension 15 mL, 15 mL, Oral, Q4H PRN, Lavina Hamman, MD, 15 mL at 12/16/15 1716 .  aspirin EC tablet 325 mg, 325 mg, Oral, Daily, Rosalin Hawking, MD, 325 mg at 12/17/15 1039 .  clopidogrel (PLAVIX) tablet 75 mg, 75 mg, Oral, Daily, Melvenia Beam, MD, 75 mg at 12/17/15 1039 .  enoxaparin (LOVENOX) injection 40 mg, 40 mg, Subcutaneous, Q24H, Lezlie Octave Black, NP, 40 mg at 12/17/15 1131 .  estradiol (ESTRACE) vaginal cream 2 g, 2 g, Vaginal, PRN, Lezlie Octave Black, NP .  levothyroxine (SYNTHROID, LEVOTHROID) tablet 25 mcg, 25 mcg, Oral, QAC breakfast, Radene Gunning, NP, 25 mcg at 12/17/15 1039 .  loteprednol (LOTEMAX) 0.5 % ophthalmic  suspension 1 drop, 1 drop, Both Eyes, QID, Lezlie Octave Black, NP, 1 drop at 12/17/15 1039 .  metoprolol tartrate (LOPRESSOR) tablet 12.5 mg, 12.5 mg, Oral, BID, Lavina Hamman, MD, 12.5 mg at 12/17/15 1039 .  polyethylene glycol (MIRALAX / GLYCOLAX) packet 17 g, 17 g, Oral, Daily, Lavina Hamman, MD, 17 g at 12/17/15 1039 .  rosuvastatin (CRESTOR) tablet 10 mg, 10 mg, Oral, q1800, David L Rinehuls, PA-C, 10 mg at 12/16/15 1715 .  senna-docusate (Senokot-S) tablet 1 tablet, 1 tablet, Oral, QHS PRN, Radene Gunning, NP  Patients Current Diet: Diet Heart Room service appropriate?: Yes; Fluid consistency:: Thin  Precautions / Restrictions Precautions Precautions: Fall Restrictions Weight Bearing Restrictions: No   Has the patient had 2 or more falls or a fall with injury in the past year?No  Prior Activity Level Community (5-7x/wk): very active and driving. gets out daily. no AD. very talkative and social  Development worker, international aid / [place] Devices/Equipment: None Home Equipment: None  Prior Device Use: Indicate devices/aids used by the patient prior to current illness, exacerbation or injury? None of the above  Prior Functional Level Prior Function Level of Independence: Independent Comments: Drives, cooks, cleans.  Self Care: Did the patient need help bathing, dressing, using the toilet or eating?  Independent  Indoor Mobility: Did the patient need assistance with walking from room to room (with or without device)? Independent  Stairs: Did the patient need assistance with internal or external stairs (with or without device)? Independent  Functional Cognition: Did the patient need help planning regular tasks such as shopping or remembering to take medications? Independent  Current Functional Level Cognition  Arousal/Alertness: Awake/alert Overall Cognitive Status: Within Functional Limits for tasks assessed Orientation Level: Oriented X4 Safety/Judgement: Decreased  awareness of safety Attention: Sustained Sustained Attention: Appears intact Memory: Impaired Memory Impairment: Retrieval deficit, Storage deficit, Decreased recall of new information (scored mod impairment on cognistat) Awareness: Appears intact Problem Solving: Appears intact Safety/Judgment: Appears intact    Extremity Assessment (includes Sensation/Coordination)  Upper Extremity Assessment: LUE deficits/detail, RUE deficits/detail RUE Deficits / Details: weakness in Rt shoulder flexors RUE Coordination: decreased fine motor LUE Deficits / Details: weakness in Lt shoulder flexors (weaker than Rt); little less than full AROM shoulder flexion LUE Coordination: decreased fine motor  Lower Extremity Assessment: Defer to PT evaluation LLE Deficits / Details: Grossly ~3+-4/5 throughtout LLE; except 3/5 hip flexion.  LLE Sensation:  (WFL)    ADLs  Overall ADL's : Needs assistance/impaired Grooming: Minimal assistance, Brushing hair, Applying deodorant, Oral care, Wash/dry face, Standing (  also dryed hands) Upper Body Bathing: Min guard, Standing Upper Body Bathing Details (indicate cue type and reason): washed armpits Lower Body Bathing: Sit to/from stand, Minimal assistance Upper Body Dressing : Minimal assistance, Standing Lower Body Dressing: Minimal assistance, Sit to/from stand Toilet Transfer: Minimal assistance, Ambulation (sit to stand from transport chair) Functional mobility during ADLs: Minimal assistance General ADL Comments: Pt using LUE functionally in session.     Mobility  Overal bed mobility: Needs Assistance Bed Mobility: Sit to Supine Supine to sit: Min guard Sit to supine: Supervision (however, assist given to scoot HOB) General bed mobility comments: Min guard for safety.     Transfers  Overall transfer level: Needs assistance Equipment used: None Transfers: Sit to/from Stand Sit to Stand: Min assist General transfer comment: assist to boost to stand.      Ambulation / Gait / Stairs / Wheelchair Mobility  Ambulation/Gait Ambulation/Gait assistance: Museum/gallery curator (Feet): 120 Feet Assistive device: 1 person hand held assist (rail) Gait Pattern/deviations: Step-to pattern, Step-through pattern, Decreased stride length, Narrow base of support, Decreased dorsiflexion - left, Decreased weight shift to right General Gait Details: Manual cues for weightshift to the right and for midline positioning as pt with tendency to lean left. Better able to progress LLE today. Does not do well with distractions. Needs cues to stay on task.  Gait velocity: decreased    Posture / Balance Dynamic Sitting Balance Sitting balance - Comments: Focused on midline positioning. Balance Overall balance assessment: Needs assistance Sitting-balance support: Feet supported, No upper extremity supported Sitting balance-Leahy Scale: Good Sitting balance - Comments: Focused on midline positioning. Postural control: Posterior lean, Left lateral lean Standing balance support: During functional activity Standing balance-Leahy Scale: Fair Standing balance comment: Able to stand unsupported but requires mirroring to obtain midling positioning.     Special needs/care consideration Bowel mgmt: LBM 5/14 continent Bladder mgmt: foley Diabetic mgmt Hgb A1c 5.7   Previous Home Environment Living Arrangements: Spouse/significant other  Lives With: Spouse Available Help at Discharge: Family, Available 24 hours/day Type of Home: House Home Layout: Two level, Able to live on main level with bedroom/bathroom Alternate Level Stairs-Rails: Right, Left Alternate Level Stairs-Number of Steps: 1 flight Home Access: Stairs to enter Entrance Stairs-Rails: None Entrance Stairs-Number of Steps: 2 Bathroom Shower/Tub: Multimedia programmer: Standard Bathroom Accessibility: Yes How Accessible: Accessible via walker [place]: No  Discharge Living  Setting Plans for Discharge Living Setting: Patient's home, Lives with (comment) (spouse) Type of Home at Discharge: House Discharge Home Layout: Two level, Able to live on main level with bedroom/bathroom Alternate Level Stairs-Rails: Right, Left Alternate Level Stairs-Number of Steps: flight Discharge Home Access: Stairs to enter Entrance Stairs-Rails: None Entrance Stairs-Number of Steps: 2 Discharge Bathroom Shower/Tub: Horticulturist, commercial: Standard Discharge Bathroom Accessibility: Yes How Accessible: Accessible via walker Does the patient have any problems obtaining your medications?: No  Social/Family/Support Systems Patient Roles: Spouse, Parent Contact Information: Patent attorney, spouse Anticipated Caregiver: spouse Anticipated Ambulance person Information: see above Ability/Limitations of Caregiver: no limitations Caregiver Availability: 24/7 Discharge Plan Discussed with Primary Caregiver: Yes Is Caregiver In Agreement with Plan?: Yes Does Caregiver/Family have Issues with Lodging/Transportation while Pt is in Rehab?: No   Goals/Additional Needs Patient/Family Goal for Rehab: mod I to supervision with PT, OT, and SLP Expected length of stay: ELOS 9-13 days Pt/Family Agrees to Admission and willing to participate: Yes Program Orientation Provided & Reviewed with Pt/Caregiver Including Roles  & Responsibilities: Yes  Decrease burden of Care through IP rehab admission: n/a  Possible need for SNF placement upon discharge:not anticipated  Patient Condition: This patient's condition remains as documented in the consult dated 5/16/17ich the Rehabilitation Physician determined and documented that the patient's condition is appropriate for intensive rehabilitative care in an inpatient rehabilitation facility. Will admit to inpatient rehab today.  Preadmission Screen Completed By:  Cleatrice Burke, 12/17/2015 3:06  PM ______________________________________________________________________   Discussed status with Dr. Naaman Plummer on 12/17/15 at 1506 and received telephone approval for today admission.  Admission Coordinator:  Cleatrice Burke, [id number] 12/17/15.

## 2015-12-17 NOTE — Progress Notes (Signed)
  Echocardiogram Echocardiogram Transesophageal has been performed.  Stacey Ramirez 12/17/2015, 10:31 AM

## 2015-12-17 NOTE — H&P (View-Only) (Signed)
[facility] Progress Note  Patient: Stacey Ramirez [id number]   PCP: Horatio Pel, MD DOB: 1940/12/30   DOA: 12/13/2015   DOS: 12/16/2015   Date of Service: the patient was seen and examined on 12/16/2015  Subjective: Patient denies any acute complaint. No nausea no vomiting. No abdominal pain. No other acute events overnight. Sinus tachycardia resolved. Nutrition: Tolerating oral diet  Brief hospital course: Patient was admitted on 12/13/2015, with complaint of slurred speech as well as left-sided weakness, was found to have right basal ganglia infarct on MRI. Currently further plan is continue further stroke workup, patient will likely need CIR.  Assessment and Plan: 1. Basal ganglia infarction [facility]) Patient presents with left-sided weakness as well as slurred speech. TPA was not given since outside of the window period. MRA shows moderate stenosis of the right PCA, right supraclinoid ICA Echocardiogram shows 60-65% EF Carotid unremarkable. LDL 238-patient has not been able to tolerate statins in the past. May be a candidate for PCS K9 Hemoglobin A1c 5.7. Patient was an 81 mg aspirin will changed to 75 mg Plavix. Was given 325 mg aspirin on admission. TEE on 12/17/2015  2. Essential hypertension. Permissive hypertension.  3. Recurrent SVT. Telemetry shows the patient has recurrent SVT. We'll start low-dose Lopressor. Maintain K more than 4 magnesium more than 2.  4. Hypothyroidism. Continue Synthroid.  Pain management: When necessary Tylenol Activity: physical therapy recommends CIR Bowel regimen: last BM 12/12/2015 MiraLAX added Diet: Cardiac diet DVT Prophylaxis: subcutaneous Heparin  Advance goals of care discussion: Full code  Family Communication: family was present at bedside, at the time of interview. The pt provided permission to discuss medical plan with the family. Opportunity was given to ask question and all questions were answered  satisfactorily.   Disposition:  Discharge to CIR, pending further stroke workup Expected discharge date: 12/17/2015  Consultants: Neurology Procedures: Echocardiogram, carotid Doppler  Antibiotics: Anti-infectives    Start     Dose/Rate Route Frequency Ordered Stop   12/13/15 1106  trimethoprim (TRIMPEX) tablet 100 mg  Status:  Discontinued     100 mg Oral Daily PRN 12/13/15 1109 12/13/15 1145        Intake/Output Summary (Last 24 hours) at 12/16/15 2001 Last data filed at 12/15/15 2117  Gross per 24 hour  Intake    240 ml  Output      0 ml  Net    240 ml   Filed Weights   12/13/15 0919  Weight: 52.164 kg (115 lb)    Objective: Physical Exam: Filed Vitals:   12/16/15 0308 12/16/15 0533 12/16/15 1024 12/16/15 1808  BP: 143/77 135/79 148/75 131/57  Pulse:  68 69 64  Temp:  98.3 F (36.8 C) 97.7 F (36.5 C) 98.4 F (36.9 C)  TempSrc:  Oral Oral Oral  Resp:  20 18 18   Height:      Weight:      SpO2:  98% 96% 97%    General: Alert, Awake and Oriented to Time, Place and Person. Appear in moderate distress Eyes: PERRL, Conjunctiva normal ENT: Oral Mucosa clear moist. Neck: difficult to assess  JVD, no Abnormal Mass Or lumps Cardiovascular: S1 and S2 Present, aortic systolic Murmur, Peripheral Pulses Present Respiratory: Bilateral Air entry equal and Decreased, Clear to Auscultation, no Crackles, no wheezes Abdomen: Bowel Sound present, Soft and no tenderness Skin: no redness, no Rash  Extremities: no Pedal edema, no calf tenderness Neurologic: Left-sided weakness, dysarthria  Data Reviewed: CBC:  Recent Labs Lab  12/13/15 0927 12/13/15 0936 12/15/15 0545  WBC 7.5  --  6.0  NEUTROABS 5.7  --  2.8  HGB 13.2 14.6 13.5  HCT 40.2 43.0 41.6  MCV 93.1  --  96.1  PLT 210  --  [id number]   Basic Metabolic Panel:  Recent Labs Lab 12/13/15 0927 12/13/15 0936 12/15/15 0545  NA 141 141 143  K 3.7 3.6 3.5  CL 106 107 107  CO2 23  --  27  GLUCOSE 119* 113* 91    BUN 13 15 12   CREATININE 0.63 0.50 0.66  CALCIUM 9.4  --  9.3  MG  --   --  2.2    Liver Function Tests:  Recent Labs Lab 12/13/15 0927 12/15/15 0545  AST 25 20  ALT 22 19  ALKPHOS 55 48  BILITOT 0.8 1.0  PROT 7.1 6.3*  ALBUMIN 4.1 3.5   No results for input(s): LIPASE, AMYLASE in the last 168 hours. No results for input(s): AMMONIA in the last 168 hours. Coagulation Profile:  Recent Labs Lab 12/13/15 0927  INR 1.01   Studies: No results found.   Scheduled Meds: .  stroke: mapping our early stages of recovery book   Does not apply Once  . clopidogrel  75 mg Oral Daily  . enoxaparin (LOVENOX) injection  40 mg Subcutaneous Q24H  . levothyroxine  25 mcg Oral QAC breakfast  . loteprednol  1 drop Both Eyes QID  . metoprolol tartrate  12.5 mg Oral BID  . polyethylene glycol  17 g Oral Daily  . rosuvastatin  10 mg Oral q1800   Continuous Infusions:  PRN Meds: ALPRAZolam, alum & mag hydroxide-simeth, estradiol, senna-docusate  Time spent: 30 minutes  Author: Berle Mull, MD [facility] Pager: [phone] 12/16/2015 8:01 PM  If 7PM-7AM, please contact night-coverage at [email or web address], password [facility]

## 2015-12-17 NOTE — Interval H&P Note (Signed)
History and Physical Interval Note:  12/17/2015 8:38 AM  Stacey Ramirez  has presented today for surgery, with the diagnosis of stroke  The various methods of treatment have been discussed with the patient and family. After consideration of risks, benefits and other options for treatment, the patient has consented to  Procedure(s) with comments: TRANSESOPHAGEAL ECHOCARDIOGRAM (TEE) (N/A) - Pt also needs a LOOP as a surgical intervention .  The patient's history has been reviewed, patient examined, no change in status, stable for surgery.  I have reviewed the patient's chart and labs.  Questions were answered to the patient's satisfaction.     Kirk Ruths

## 2015-12-17 NOTE — H&P (View-Only) (Signed)
Physical Medicine and Rehabilitation Admission H&P    Chief Complaint  Patient presents with  . Left sided weakness, left visual field deficits and slurred speech.      HPI:   Stacey Ramirez is a 75 y.o. female with history of HTN, aortic stenosis, L-CVA 2014 with gait disorder and minimal RLE weakness who was admitted on 12/13/15 with left sided weakness and dysarthric speech. MRI/MRA brain done revealing acute infarct in right basal ganglia with mild progression of PCA atherosclerosis and no large vessel occlusion. 2D echo with EF 60-65% with mild LVH, no aortic or mitral stenosis and no wall abnormality. Carotid dopplers without significant ICA stenosis. ASA changed to plavix for stroked due to small vessel disease and uncontrolled hyperlipidemia as patient intolerant of statins. Crestor increased to 10 mg daily and low dose BB added due to recurrent SVT. Plans for TEE done 5/16 and showed normal LVF and no PFO. Loop recorder placed by Dr. Rayann Heman.   Cognitive evaluation showed dysarthria and cognitive deficits to be at baseline. Patient with resultant LLE weakness with narrow BOS, visual deficits in left upper quadrant as well as delayed processing with slow initiation.    Review of Systems  HENT: Negative for hearing loss.   Eyes: Positive for blurred vision. Negative for photophobia.  Respiratory: Negative for cough, shortness of breath and wheezing.   Cardiovascular: Positive for palpitations (occasional). Negative for chest pain.  Gastrointestinal: Positive for heartburn and constipation. Negative for nausea and diarrhea.       Has had difficulty with swallow for years--has to be out of bed  Musculoskeletal: Negative for myalgias, back pain and joint pain.  Neurological: Positive for dizziness, speech change, focal weakness (Reports was having weakness in RLE prior to admission. ) and headaches.  Psychiatric/Behavioral: Positive for memory loss. The patient is nervous/anxious and has  insomnia.       Past Medical History  Diagnosis Date  . Hypertension     LIABLE  . Hypercholesterolemia   . Hypothyroidism   . Vitamin D deficiency   . Palpitations   . History of dizziness   . LVH (left ventricular hypertrophy)   . Aortic stenosis   . Mitral regurgitation   . SOB (shortness of breath)   . Difficulty walking   . Cerebrovascular accident (stroke) [facility])     Past Surgical History  Procedure Laterality Date  . Cardiovascular stress test  2002    NORMAL  . Transthoracic echocardiogram  2008    SHOWED LEFT VENTRICULAR HYPERTROPHY AND MILD AORTIC STENOSIS  . Ep implantable device N/A 12/17/2015    Procedure: Loop Recorder Insertion;  Surgeon: [name], MD;  Location: [facility];  Service: Cardiovascular;  Laterality: N/A;    Family History  Problem Relation Age of Onset  . Heart attack Mother   . Alzheimer's disease Mother     Social History:  [name] is retired and can assist after discharge. 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 Independent without AD but "slow". She reports that she has never smoked. She does not have any smokeless tobacco history on file. She reports that she drinks a glass of wine daily. She reports that she does not use illicit drugs.   Allergies  Allergen Reactions  . Sulfa Drugs Cross Reactors Itching  . Zocor [Simvastatin] Other (See Comments)    Feels like she is going to pass out, weakness  . Micardis [Telmisartan] Other (See Comments)    Feels  like she is going to pass out    Medications Prior to Admission  Medication Sig Dispense Refill  . ALPRAZolam (XANAX) 0.25 MG tablet Take 1 tablet (0.25 mg total) by mouth daily as needed for anxiety (anxiety). 30 tablet 1  . amLODipine (NORVASC) 2.5 MG tablet Take 2.5 mg by mouth every evening.     Marland Kitchen aspirin EC 325 MG EC tablet Take 1 tablet (325 mg total) by mouth daily. 120 tablet 0  . clopidogrel (PLAVIX) 75 MG tablet Take 1 tablet (75 mg total) by mouth  daily. 30 tablet 0  . diltiazem 2 % GEL Apply 1 application topically 2 (two) times daily. (Patient not taking: Reported on 12/13/2015) 30 g 3  . estradiol (ESTRACE) 0.1 MG/GM vaginal cream Place 2 g vaginally as needed (dryness).     Marland Kitchen levothyroxine (SYNTHROID, LEVOTHROID) 25 MCG tablet Take 25 mcg by mouth daily before breakfast.   6  . LOTEMAX 0.5 % ophthalmic suspension Place 1 drop into both eyes 4 (four) times daily.     . metoprolol tartrate (LOPRESSOR) 25 MG tablet Take 0.5 tablets (12.5 mg total) by mouth 2 (two) times daily. 30 tablet 0  . polyethylene glycol (MIRALAX / GLYCOLAX) packet Take 17 g by mouth daily. 14 each 0  . rosuvastatin (CRESTOR) 10 MG tablet Take 10 mg by mouth as directed. 1/4 tablet every other day    . trimethoprim (TRIMPEX) 100 MG tablet Take 100 mg by mouth daily as needed (infection).     . valsartan (DIOVAN) 320 MG tablet Take 320 mg by mouth daily.   2    Home: Home Living Family/patient expects to be discharged to:: Inpatient rehab Living Arrangements: Spouse/significant other Available Help at Discharge: Family, Available 24 hours/day Type of Home: House Home Access: Stairs to enter [employer] of Steps: 2 Entrance Stairs-Rails: None Home Layout: Two level, Able to live on main level with bedroom/bathroom Alternate Level Stairs-Number of Steps: 1 flight Alternate Level Stairs-Rails: Right, Left Bathroom Shower/Tub: Multimedia programmer: Standard Bathroom Accessibility: Yes Home Equipment: None  Lives With: Spouse   Functional History: Prior Function Level of Independence: Independent Comments: Drives, cooks, cleans.  Functional Status:  Mobility: Bed Mobility Overal bed mobility: Needs Assistance Bed Mobility: Sit to Supine Supine to sit: Min guard Sit to supine: Supervision (however, assist given to scoot HOB) General bed mobility comments: Min guard for safety.  Transfers Overall transfer level: Needs  assistance Equipment used: None Transfers: Sit to/from Stand Sit to Stand: Min assist General transfer comment: assist to boost to stand.  Ambulation/Gait Ambulation/Gait assistance: Min assist Ambulation Distance (Feet): 120 Feet Assistive device: 1 person hand held assist (rail) Gait Pattern/deviations: Step-to pattern, Step-through pattern, Decreased stride length, Narrow base of support, Decreased dorsiflexion - left, Decreased weight shift to right General Gait Details: Manual cues for weightshift to the right and for midline positioning as pt with tendency to lean left. Better able to progress LLE today. Does not do well with distractions. Needs cues to stay on task.  Gait velocity: decreased    ADL: ADL Overall ADL's : Needs assistance/impaired Grooming: Minimal assistance, Brushing hair, Applying deodorant, Oral care, Wash/dry face, Standing (also dryed hands) Upper Body Bathing: Min guard, Standing Upper Body Bathing Details (indicate cue type and reason): washed armpits Lower Body Bathing: Sit to/from stand, Minimal assistance Upper Body Dressing : Minimal assistance, Standing Lower Body Dressing: Minimal assistance, Sit to/from stand Toilet Transfer: Minimal assistance, Ambulation (sit to stand from  transport chair) Functional mobility during ADLs: Minimal assistance General ADL Comments: Pt using LUE functionally in session.   Cognition: Cognition Overall Cognitive Status: Within Functional Limits for tasks assessed Arousal/Alertness: Awake/alert Orientation Level: Oriented X4 Attention: Sustained Sustained Attention: Appears intact Memory: Impaired Memory Impairment: Retrieval deficit, Storage deficit, Decreased recall of new information (scored mod impairment on cognistat) Awareness: Appears intact Problem Solving: Appears intact Safety/Judgment: Appears intact Cognition Arousal/Alertness: Awake/alert Behavior During Therapy: WFL for tasks assessed/performed,  Flat affect Overall Cognitive Status: Within Functional Limits for tasks assessed Area of Impairment: Safety/judgement, Memory Memory: Decreased short-term memory Safety/Judgement: Decreased awareness of safety   Blood pressure 151/60, pulse 69, temperature 98.2 F (36.8 C), temperature source Oral, resp. rate 16, height _0  (1.575 m), weight 52.164 kg (115 lb), SpO2 98 %. Physical Exam  Nursing note and vitals reviewed. Constitutional: She is oriented to person, place, and time. She appears well-developed and well-nourished.  HENT:  Head: Normocephalic and atraumatic.  Right Ear: External ear normal.  Left Ear: External ear normal.  Eyes: Conjunctivae are normal. Pupils are equal, round, and reactive to light.  Neck: Normal range of motion. Neck supple. No tracheal deviation present. No thyromegaly present.  Cardiovascular: Normal rate and regular rhythm.  Exam reveals no friction rub.   Murmur heard. Respiratory: Effort normal and breath sounds normal. No stridor. No respiratory distress. She has no wheezes. She has no rales.  GI: Bowel sounds are normal. She exhibits no distension. There is no tenderness.  Musculoskeletal: She exhibits no edema or tenderness.  Neurological: She is alert and oriented to person, place, and time.  Mild left facial weakness. Soft voice but no dysarthria. Able to follow one and two step commands without difficulty. Reasonable insight and awareness. Motor:  Right upper extremity: 5/5 proximal distal  RLE: 5/5HF,KE and 4 to 4+ ADF/PF Left upper; 4/5 deltoid, bicep, tricep, wrist, hand Left lower extremity and 3+HF, 4-KE 4 ADF/PF DTRs symmetric. No sensory deficits Sensation intact light touch   Skin: Skin is warm and dry. No rash noted. No erythema.  Psychiatric: Thought content normal. Her mood appears anxious. She exhibits abnormal remote memory.    Results for orders placed or performed during the hospital encounter of 12/13/15 (from the past 48  hour(s))  CBC     Status: None   Collection Time: 12/17/15  2:19 AM  Result Value Ref Range   WBC 5.8 4.0 - 10.5 K/uL   RBC 4.36 3.87 - 5.11 MIL/uL   Hemoglobin 13.6 12.0 - 15.0 g/dL   HCT 41.0 36.0 - 46.0 %   MCV 94.0 78.0 - 100.0 fL   MCH 31.2 26.0 - 34.0 pg   MCHC 33.2 30.0 - 36.0 g/dL   RDW 12.7 11.5 - 15.5 %   Platelets 219 150 - 400 K/uL  Basic metabolic panel     Status: Abnormal   Collection Time: 12/17/15  2:19 AM  Result Value Ref Range   Sodium 141 135 - 145 mmol/L   Potassium 4.0 3.5 - 5.1 mmol/L   Chloride 104 101 - 111 mmol/L   CO2 29 22 - 32 mmol/L   Glucose, Bld 105 (H) 65 - 99 mg/dL   BUN 10 6 - 20 mg/dL   Creatinine, Ser 0.79 0.44 - 1.00 mg/dL   Calcium 9.6 8.9 - 10.3 mg/dL   GFR calc non Af Amer >60 >60 mL/min   GFR calc Af Amer >60 >60 mL/min    Comment: (NOTE) The eGFR has  been calculated using the CKD EPI equation. This calculation has not been validated in all clinical situations. eGFR's persistently <60 mL/min signify possible Chronic Kidney Disease.    Anion gap 8 5 - 15   No results found.     Medical Problem List and Plan: 1.  Gait and functional deficits secondary to right basal ganglia infarct 2.  DVT Prophylaxis/Anticoagulation: Pharmaceutical: Lovenox 3. Pain Management:tylenol prn 4. Mood: LCSW to follow for evaluation and support. Xanax prn for anxiety.  5. Neuropsych: This patient is capable of making decisions on her own behalf. 6. Skin/Wound Care: Routine pressure relief measures.  7. Fluids/Electrolytes/Nutrition: Monitor I/O. Check lytes in am. Offer supplements between meals.  8. Dyslipidemia: On Crestor 20 mg daily ( alternate Praluent if unable to tolerate statin) 9. Hypothyroid: On supplement.  10. Aortic stenosis/MR with DOE:  Continue Metoprolol bid.   11. Constipation: On Miralax 12. HTN: monitor BP tid--Norvasc and Diovan on hold at this time to allow for adequate perfusion.       Post Admission Physician  Evaluation: 1. Functional deficits secondary  to right basal ganglia infarct. 2. Patient is admitted to receive collaborative, interdisciplinary care between the physiatrist, rehab nursing staff, and therapy team. 3. Patient's level of medical complexity and substantial therapy needs in context of that medical necessity cannot be provided at a lesser intensity of care such as a SNF. 4. Patient has experienced substantial functional loss from his/her baseline which was documented above under the "Functional History" and "Functional Status" headings.  Judging by the patient's diagnosis, physical exam, and functional history, the patient has potential for functional progress which will result in measurable gains while on inpatient rehab.  These gains will be of substantial and practical use upon discharge  in facilitating mobility and self-care at the household level. 5. Physiatrist will provide 24 hour management of medical needs as well as oversight of the therapy plan/treatment and provide guidance as appropriate regarding the interaction of the two. 6. 24 hour rehab nursing will assist with bladder management, bowel management, safety, skin/wound care, disease management, medication administration, pain management and patient education  and help integrate therapy concepts, techniques,education, etc. 7. PT will assess and treat for/with: Lower extremity strength, range of motion, stamina, balance, functional mobility, safety, adaptive techniques and equipment, NMR, visual-spatial awareness, education, ego support/anxiety mgt.   Goals are: mod I. 8. OT will assess and treat for/with: ADL's, functional mobility, safety, upper extremity strength, adaptive techniques and equipment, NMR, community reintegration, .   Goals are: mod I. Therapy may proceed with showering this patient. 9. SLP will assess and treat for/with: speech/communication.  Goals are: mod I. 10. Case Management and Social Worker will assess  and treat for psychological issues and discharge planning. 11. Team conference will be held weekly to assess progress toward goals and to determine barriers to discharge. 12. Patient will receive at least 3 hours of therapy per day at least 5 days per week. 13. ELOS: 7-9 days       14. Prognosis:  excellent     Meredith Staggers, MD, [facility] [facility] 12/17/2015   12/17/2015

## 2015-12-17 NOTE — Care Management Note (Signed)
Case Management Note  Patient Details  Name: RAAHI BEDROSSIAN MRN: [id number] Date of Birth: [birth year]  Subjective/Objective:                    Action/Plan: Pt discharging to CIR today. No further needs per CM.   Expected Discharge Date:                  Expected Discharge Plan:  [place]  In-House Referral:     Discharge planning Services     Post Acute Care Choice:    Choice offered to:     DME Arranged:    DME Agency:     HH Arranged:    [place] Agency:     Status of Service:  Completed, signed off  Medicare Important Message Given:  Yes Date Medicare IM Given:    Medicare IM give by:    Date Additional Medicare IM Given:    Additional Medicare Important Message give by:     If discussed at [place] of Stay Meetings, dates discussed:    Additional Comments:  Pollie Friar, RN 12/17/2015, 3:30 PM

## 2015-12-17 NOTE — Progress Notes (Signed)
Stacey Lorie Phenix, MD Physician Signed Physical Medicine and Rehabilitation Consult Note 12/16/2015 8:40 AM  Related encounter: ED to Hosp-Admission (Current) from 12/13/2015 in [place] [facility]apse All        Physical Medicine and Rehabilitation Consult   Reason for Consult: Left sided weakness, left visual field deficits and speech difficulties Referring Physician: Dr. Berle Mull   HPI: Stacey Ramirez is a 75 y.o. female with history of HTN, aortic stenosis, L-CVA 2014 with gait disorder and minimal RLE weakness who was admitted on 12/13/15 with left sided weakness and dysarthric speech. MRI/MRA brain Ramirez revealing acute infarct in right basal ganglia with mild progression of PCA atherosclerosis and no large vessel occlusion. 2D echo with EF 60-65% with mild LVH, no aortic or mitral stenosis and no wall abnormality. Carotid dopplers without significant ICA stenosis. ASA changed to plavix for stroked due to small vessel disease and uncontrolled hyperlipidemia as patient intolerant of statins. Crestor increased to 10 mg daily and low dose BB added due to recurrent SVT. Plans for TEE tomorrow for full workup of stroke. Cognitive evaluation showed dysarthria and cognitive deficits to be at baseline. Patient with resultant LLE weakness with narrow BOS, visual deficits in left upper quadrant ans well as delayed processing. CIR recommended by MD and rehab team.   Has been sitting up in chair for about an hour this weekend. Did get a little dizzy with activity this am.   Review of Systems  HENT: Negative for hearing loss.  Eyes: Positive for blurred vision. Negative for double vision.  Respiratory: Negative for cough, shortness of breath and wheezing.  Cardiovascular: Negative for chest pain, palpitations and leg swelling.  Gastrointestinal: Positive for constipation. Negative for heartburn, nausea and abdominal pain.   Problems with  swallowing for years--has to sit up to eat.  Genitourinary: Negative for dysuria, urgency and frequency.  Musculoskeletal: Negative for myalgias, back pain and joint pain.  Skin: Negative for itching and rash.  Neurological: Positive for speech change, focal weakness and headaches. Negative for sensory change.  Psychiatric/Behavioral: Positive for memory loss.  All other systems reviewed and are negative.   Past Medical History  Diagnosis Date  . Hypertension     LIABLE  . Hypercholesterolemia   . Hypothyroidism   . Vitamin D deficiency   . Palpitations   . History of dizziness   . LVH (left ventricular hypertrophy)   . Aortic stenosis   . Mitral regurgitation   . SOB (shortness of breath)   . Difficulty walking   . Cerebrovascular accident (stroke) [facility])     Past Surgical History  Procedure Laterality Date  . Cardiovascular stress test  2002    NORMAL  . Transthoracic echocardiogram  2008    SHOWED LEFT VENTRICULAR HYPERTROPHY AND MILD AORTIC STENOSIS    Family History  Problem Relation Age of Onset  . Heart attack Mother   . Alzheimer's disease Mother     Social History: Stacey Ramirez is retired and can assist after discharge. 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 She reports that she has never smoked. She does not have any smokeless tobacco history on file. She reports that she drinks a glass of wine daily. She reports that she does not use illicit drugs.   Allergies  Allergen Reactions  . Sulfa Drugs Cross Reactors Itching  . Zocor [Simvastatin] Other (See Comments)    Feels like she is going to pass  out, weakness  . Micardis [Telmisartan] Other (See Comments)    Feels like she is going to pass out    Medications Prior to Admission  Medication Sig Dispense Refill  . ALPRAZolam (XANAX) 0.25 MG tablet Take 1 tablet (0.25 mg total) by mouth daily  as needed for anxiety (anxiety). 30 tablet 1  . amLODipine (NORVASC) 2.5 MG tablet Take 2.5 mg by mouth every evening.     Marland Kitchen aspirin 81 MG tablet Take 81 mg by mouth daily.    Marland Kitchen estradiol (ESTRACE) 0.1 MG/GM vaginal cream Place 2 g vaginally as needed (dryness).     Marland Kitchen levothyroxine (SYNTHROID, LEVOTHROID) 25 MCG tablet Take 25 mcg by mouth daily before breakfast.   6  . LOTEMAX 0.5 % ophthalmic suspension Place 1 drop into both eyes 4 (four) times daily.     . rosuvastatin (CRESTOR) 10 MG tablet Take 10 mg by mouth as directed. 1/4 tablet every other day    . trimethoprim (TRIMPEX) 100 MG tablet Take 100 mg by mouth daily as needed (infection).     . valsartan (DIOVAN) 320 MG tablet Take 320 mg by mouth daily.   2  . diltiazem 2 % GEL Apply 1 application topically 2 (two) times daily. (Patient not taking: Reported on 12/13/2015) 30 g 3  . hydrochlorothiazide (MICROZIDE) 12.5 MG capsule 1 Capsule on Monday, Wednesday, AND Friday ONLY (Patient not taking: Reported on 12/13/2015) 30 capsule 5    Home: Home Living Family/patient expects to be discharged to:: Inpatient rehab Living Arrangements: Spouse/significant other Available Help at Discharge: Family, Available 24 hours/day Type of Home: House Home Access: Stairs to enter [employer] of Steps: 2 Entrance Stairs-Rails: None Home Layout: Two level, Able to live on main level with bedroom/bathroom Alternate Level Stairs-Number of Steps: 1 flight Alternate Level Stairs-Rails: Right, Left Home Equipment: None Lives With: Spouse  Functional History: Prior Function Level of Independence: Independent Comments: Drives, cooks, cleans. Functional Status:  Mobility: Bed Mobility Overal bed mobility: Needs Assistance Bed Mobility: Supine to Sit Supine to sit: Min guard Sit to supine: Supervision General bed mobility comments: Min guard for safety.  Transfers Overall transfer  level: Needs assistance Equipment used: None Transfers: Sit to/from Stand Sit to Stand: Min assist General transfer comment: MIn A to boost from EOB with cues for foot/hand placement, anterior translation and upright posture. Posterior bias. Cues for slow descent onto surface. transferred to chair post ambulation. Ambulation/Gait Ambulation/Gait assistance: Min assist Ambulation Distance (Feet): 120 Feet Assistive device: 1 person hand held assist (rail) Gait Pattern/deviations: Step-to pattern, Step-through pattern, Decreased stride length, Narrow base of support, Decreased dorsiflexion - left, Decreased weight shift to right General Gait Details: Manual cues for weightshift to the right and for midline positioning as pt with tendency to lean left. Better able to progress LLE today. Does not do well with distractions. Needs cues to stay on task.  Gait velocity: decreased    ADL: ADL Overall ADL's : Needs assistance/impaired Lower Body Bathing: Sit to/from stand, Minimal assistance Lower Body Dressing: Minimal assistance, Sit to/from stand Toilet Transfer: Moderate assistance, Ambulation (sit to stand from bed) Functional mobility during ADLs: (Mod A-ambulation; Min A-sit to stand) General ADL Comments: explained to compensate for vision in Lt upper quadrant to turn her head that way.   Cognition: Cognition Overall Cognitive Status: Impaired/Different from baseline Arousal/Alertness: (form memory) Orientation Level: Oriented X4 Attention: Sustained Sustained Attention: Appears intact Memory: Impaired Memory Impairment: Retrieval deficit, Storage deficit, Decreased recall of  new information (scored mod impairment on cognistat) Awareness: Appears intact Problem Solving: Appears intact Safety/Judgment: Appears intact Cognition Arousal/Alertness: Awake/alert Behavior During Therapy: WFL for tasks assessed/performed Overall Cognitive Status: Impaired/Different from baseline Area  of Impairment: Safety/judgement, Memory Memory: Decreased short-term memory Safety/Judgement: Decreased awareness of safety   Blood pressure 148/75, pulse 69, temperature 97.7 F (36.5 C), temperature source Oral, resp. rate 18, height 5\' 2"  (1.575 m), weight 52.164 kg (115 lb), SpO2 96 %. Physical Exam  Vitals reviewed. Constitutional: She is oriented to person, place, and time. She appears well-developed and well-nourished.  HENT:  Head: Normocephalic and atraumatic.  Eyes: Conjunctivae and EOM are normal. Pupils are unequal.  Left pupil with mild irregularity. Left lateral field cut.  Neck: Normal range of motion. Neck supple.  Cardiovascular: Normal rate and regular rhythm.  Respiratory: Effort normal and breath sounds normal. No stridor. No respiratory distress. She has no wheezes.  GI: Soft. Bowel sounds are normal. She exhibits no distension. There is no tenderness.  Musculoskeletal: She exhibits no edema or tenderness.  Neurological: She is alert and oriented to person, place, and time. A cranial nerve deficit is present.  Had decreased recall and lacks insight and awareness of deficits.  She has left facial weakness. Speech soft but clear with minimal dysarthria.  She is able to follow one and two step commands without difficulty.  Motor:  Right upper extremity/right lower extremity: 5/5 proximal distal Left upper; 4/5 proximal to distal Left lower extremity and 4+/5 proximal distal DTRs symmetric Sensation intact light touch  Skin: Skin is warm and dry. No rash noted. No erythema.  Psychiatric: She has a normal mood and affect. Her behavior is normal. Her speech is not delayed and not tangential.     Lab Results Last 24 Hours    No results found for this or any previous visit (from the past 24 hour(s)).    Imaging Results (Last 48 hours)    No results found.    Assessment/Plan: Diagnosis: acute infarct in right basal ganglia Labs and images independently  reviewed. Records reviewed and summated above. Stroke: Continue secondary stroke prophylaxis and Risk Factor Modification listed below:  Antiplatelet therapy:  Blood Pressure Management: Continue current medication with prn's with permisive HTN per primary team Statin Agent: Monitor for tolerance, due to history Pre-Diabetes management:  Left sided hemiparesis Motor recovery: Fluoxetine  1. Does the need for close, 24 hr/day medical supervision in concert with the patient's rehab needs make it unreasonable for this patient to be served in a less intensive setting? Yes  2. Co-Morbidities requiring supervision/potential complications: SVT (monitor heart rate with increased physical activity), hyperlipidemia, HTN (monitor and provide prns in accordance with increased physical exertion and pain), aortic stenosis (Monitor in accordance with increased physical activity and avoid UE resistance excercises), L-CVA 2014 with gait disorder and minimal RLE weakness, hypothyroidism (continue meds, ensure mood does not limit therapies) 3. Due to safety, disease management, medication administration and patient education, does the patient require 24 hr/day rehab nursing? Yes 4. Does the patient require coordinated care of a physician, rehab nurse, PT (1-2 hrs/day, 5 days/week), OT (1-2 hrs/day, 5 days/week) and SLP (1-2 hrs/day, 5 days/week) to address physical and functional deficits in the context of the above medical diagnosis(es)? Yes Addressing deficits in the following areas: balance, endurance, locomotion, strength, transferring, dressing, toileting, speech and psychosocial support 5. Can the patient actively participate in an intensive therapy program of at least 3 hrs of therapy per day at  least 5 days per week? Yes 6. The potential for patient to make measurable gains while on inpatient rehab is excellent and good 7. Anticipated functional outcomes upon discharge from inpatient rehab are  modified independent and supervision with PT, modified independent with OT, modified independent with SLP. 8. Estimated rehab length of stay to reach the above functional goals is: 9-13 days. 9. Does the patient have adequate social supports and living environment to accommodate these discharge functional goals? Yes 10. Anticipated D/C setting: Home 11. Anticipated post D/C treatments: HH therapy and Home excercise program 12. Overall Rehab/Functional Prognosis: good  RECOMMENDATIONS: This patient's condition is appropriate for continued rehabilitative care in the following setting: CIR after completion of medical workup (TEE pending).    Patient has agreed to participate in recommended program. Yes Note that insurance prior authorization may be required for reimbursement for recommended care.  Comment: Rehab Admissions Coordinator to follow up.  Delice Lesch, MD 12/16/2015       Revision History     Date/Time User Provider Type Action   12/16/2015 1:23 PM Stacey Lorie Phenix, MD Physician Sign   12/16/2015 11:54 AM Bary Leriche, PA-C Physician Assistant Share   View Details Report       Routing History     Date/Time From To Method   12/16/2015 1:23 PM Stacey Lorie Phenix, MD Deland Pretty, MD Fax

## 2015-12-17 NOTE — Progress Notes (Signed)
Ankit Lorie Phenix, MD Physician Signed Physical Medicine and Rehabilitation Consult Note 12/16/2015 8:40 AM  Related encounter: ED to Hosp-Admission (Current) from 12/13/2015 in [place] Collapse All        Physical Medicine and Rehabilitation Consult   Reason for Consult: Left sided weakness, left visual field deficits and speech difficulties Referring Physician: Dr. Berle Mull   HPI: Stacey Ramirez is a 75 y.o. female with history of HTN, aortic stenosis, L-CVA 2014 with gait disorder and minimal RLE weakness who was admitted on 12/13/15 with left sided weakness and dysarthric speech. MRI/MRA brain done revealing acute infarct in right basal ganglia with mild progression of PCA atherosclerosis and no large vessel occlusion. 2D echo with EF 60-65% with mild LVH, no aortic or mitral stenosis and no wall abnormality. Carotid dopplers without significant ICA stenosis. ASA changed to plavix for stroked due to small vessel disease and uncontrolled hyperlipidemia as patient intolerant of statins. Crestor increased to 10 mg daily and low dose BB added due to recurrent SVT. Plans for TEE tomorrow for full workup of stroke. Cognitive evaluation showed dysarthria and cognitive deficits to be at baseline. Patient with resultant LLE weakness with narrow BOS, visual deficits in left upper quadrant ans well as delayed processing. CIR recommended by MD and rehab team.   Has been sitting up in chair for about an hour this weekend. Did get a little dizzy with activity this am.   Review of Systems  HENT: Negative for hearing loss.  Eyes: Positive for blurred vision. Negative for double vision.  Respiratory: Negative for cough, shortness of breath and wheezing.  Cardiovascular: Negative for chest pain, palpitations and leg swelling.  Gastrointestinal: Positive for constipation. Negative for heartburn, nausea and abdominal pain.   Problems with  swallowing for years--has to sit up to eat.  Genitourinary: Negative for dysuria, urgency and frequency.  Musculoskeletal: Negative for myalgias, back pain and joint pain.  Skin: Negative for itching and rash.  Neurological: Positive for speech change, focal weakness and headaches. Negative for sensory change.  Psychiatric/Behavioral: Positive for memory loss.  All other systems reviewed and are negative.   Past Medical History  Diagnosis Date  . Hypertension     LIABLE  . Hypercholesterolemia   . Hypothyroidism   . Vitamin D deficiency   . Palpitations   . History of dizziness   . LVH (left ventricular hypertrophy)   . Aortic stenosis   . Mitral regurgitation   . SOB (shortness of breath)   . Difficulty walking   . Cerebrovascular accident (stroke) [facility])     Past Surgical History  Procedure Laterality Date  . Cardiovascular stress test  2002    NORMAL  . Transthoracic echocardiogram  2008    SHOWED LEFT VENTRICULAR HYPERTROPHY AND MILD AORTIC STENOSIS    Family History  Problem Relation Age of Onset  . Heart attack Mother   . Alzheimer's disease Mother     Social History: [name] is retired and can assist after discharge. 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 She reports that she has never smoked. She does not have any smokeless tobacco history on file. She reports that she drinks a glass of wine daily. She reports that she does not use illicit drugs.   Allergies  Allergen Reactions  . Sulfa Drugs Cross Reactors Itching  . Zocor [Simvastatin] Other (See Comments)    Feels like she is going to pass  out, weakness  . Micardis [Telmisartan] Other (See Comments)    Feels like she is going to pass out    Medications Prior to Admission  Medication Sig Dispense Refill  . ALPRAZolam (XANAX) 0.25 MG tablet Take 1 tablet (0.25 mg total) by mouth daily  as needed for anxiety (anxiety). 30 tablet 1  . amLODipine (NORVASC) 2.5 MG tablet Take 2.5 mg by mouth every evening.     Marland Kitchen aspirin 81 MG tablet Take 81 mg by mouth daily.    Marland Kitchen estradiol (ESTRACE) 0.1 MG/GM vaginal cream Place 2 g vaginally as needed (dryness).     Marland Kitchen levothyroxine (SYNTHROID, LEVOTHROID) 25 MCG tablet Take 25 mcg by mouth daily before breakfast.   6  . LOTEMAX 0.5 % ophthalmic suspension Place 1 drop into both eyes 4 (four) times daily.     . rosuvastatin (CRESTOR) 10 MG tablet Take 10 mg by mouth as directed. 1/4 tablet every other day    . trimethoprim (TRIMPEX) 100 MG tablet Take 100 mg by mouth daily as needed (infection).     . valsartan (DIOVAN) 320 MG tablet Take 320 mg by mouth daily.   2  . diltiazem 2 % GEL Apply 1 application topically 2 (two) times daily. (Patient not taking: Reported on 12/13/2015) 30 g 3  . hydrochlorothiazide (MICROZIDE) 12.5 MG capsule 1 Capsule on Monday, Wednesday, AND Friday ONLY (Patient not taking: Reported on 12/13/2015) 30 capsule 5    Home: Home Living Family/patient expects to be discharged to:: Inpatient rehab Living Arrangements: Spouse/significant other Available Help at Discharge: Family, Available 24 hours/day Type of Home: House Home Access: Stairs to enter [employer] of Steps: 2 Entrance Stairs-Rails: None Home Layout: Two level, Able to live on main level with bedroom/bathroom Alternate Level Stairs-Number of Steps: 1 flight Alternate Level Stairs-Rails: Right, Left Home Equipment: None Lives With: Spouse  Functional History: Prior Function Level of Independence: Independent Comments: Drives, cooks, cleans. Functional Status:  Mobility: Bed Mobility Overal bed mobility: Needs Assistance Bed Mobility: Supine to Sit Supine to sit: Min guard Sit to supine: Supervision General bed mobility comments: Min guard for safety.  Transfers Overall transfer  level: Needs assistance Equipment used: None Transfers: Sit to/from Stand Sit to Stand: Min assist General transfer comment: MIn A to boost from EOB with cues for foot/hand placement, anterior translation and upright posture. Posterior bias. Cues for slow descent onto surface. transferred to chair post ambulation. Ambulation/Gait Ambulation/Gait assistance: Min assist Ambulation Distance (Feet): 120 Feet Assistive device: 1 person hand held assist (rail) Gait Pattern/deviations: Step-to pattern, Step-through pattern, Decreased stride length, Narrow base of support, Decreased dorsiflexion - left, Decreased weight shift to right General Gait Details: Manual cues for weightshift to the right and for midline positioning as pt with tendency to lean left. Better able to progress LLE today. Does not do well with distractions. Needs cues to stay on task.  Gait velocity: decreased    ADL: ADL Overall ADL's : Needs assistance/impaired Lower Body Bathing: Sit to/from stand, Minimal assistance Lower Body Dressing: Minimal assistance, Sit to/from stand Toilet Transfer: Moderate assistance, Ambulation (sit to stand from bed) Functional mobility during ADLs: (Mod A-ambulation; Min A-sit to stand) General ADL Comments: explained to compensate for vision in Lt upper quadrant to turn her head that way.   Cognition: Cognition Overall Cognitive Status: Impaired/Different from baseline Arousal/Alertness: (form memory) Orientation Level: Oriented X4 Attention: Sustained Sustained Attention: Appears intact Memory: Impaired Memory Impairment: Retrieval deficit, Storage deficit, Decreased recall of  new information (scored mod impairment on cognistat) Awareness: Appears intact Problem Solving: Appears intact Safety/Judgment: Appears intact Cognition Arousal/Alertness: Awake/alert Behavior During Therapy: WFL for tasks assessed/performed Overall Cognitive Status: Impaired/Different from baseline Area  of Impairment: Safety/judgement, Memory Memory: Decreased short-term memory Safety/Judgement: Decreased awareness of safety   Blood pressure 148/75, pulse 69, temperature 97.7 F (36.5 C), temperature source Oral, resp. rate 18, height 5\' 2"  (1.575 m), weight 52.164 kg (115 lb), SpO2 96 %. Physical Exam  Vitals reviewed. Constitutional: She is oriented to person, place, and time. She appears well-developed and well-nourished.  HENT:  Head: Normocephalic and atraumatic.  Eyes: Conjunctivae and EOM are normal. Pupils are unequal.  Left pupil with mild irregularity. Left lateral field cut.  Neck: Normal range of motion. Neck supple.  Cardiovascular: Normal rate and regular rhythm.  Respiratory: Effort normal and breath sounds normal. No stridor. No respiratory distress. She has no wheezes.  GI: Soft. Bowel sounds are normal. She exhibits no distension. There is no tenderness.  Musculoskeletal: She exhibits no edema or tenderness.  Neurological: She is alert and oriented to person, place, and time. A cranial nerve deficit is present.  Had decreased recall and lacks insight and awareness of deficits.  She has left facial weakness. Speech soft but clear with minimal dysarthria.  She is able to follow one and two step commands without difficulty.  Motor:  Right upper extremity/right lower extremity: 5/5 proximal distal Left upper; 4/5 proximal to distal Left lower extremity and 4+/5 proximal distal DTRs symmetric Sensation intact light touch  Skin: Skin is warm and dry. No rash noted. No erythema.  Psychiatric: She has a normal mood and affect. Her behavior is normal. Her speech is not delayed and not tangential.     Lab Results Last 24 Hours    No results found for this or any previous visit (from the past 24 hour(s)).    Imaging Results (Last 48 hours)    No results found.    Assessment/Plan: Diagnosis: acute infarct in right basal ganglia Labs and images independently  reviewed. Records reviewed and summated above. Stroke: Continue secondary stroke prophylaxis and Risk Factor Modification listed below:  Antiplatelet therapy:  Blood Pressure Management: Continue current medication with prn's with permisive HTN per primary team Statin Agent: Monitor for tolerance, due to history Pre-Diabetes management:  Left sided hemiparesis Motor recovery: Fluoxetine  1. Does the need for close, 24 hr/day medical supervision in concert with the patient's rehab needs make it unreasonable for this patient to be served in a less intensive setting? Yes  2. Co-Morbidities requiring supervision/potential complications: SVT (monitor heart rate with increased physical activity), hyperlipidemia, HTN (monitor and provide prns in accordance with increased physical exertion and pain), aortic stenosis (Monitor in accordance with increased physical activity and avoid UE resistance excercises), L-CVA 2014 with gait disorder and minimal RLE weakness, hypothyroidism (continue meds, ensure mood does not limit therapies) 3. Due to safety, disease management, medication administration and patient education, does the patient require 24 hr/day rehab nursing? Yes 4. Does the patient require coordinated care of a physician, rehab nurse, PT (1-2 hrs/day, 5 days/week), OT (1-2 hrs/day, 5 days/week) and SLP (1-2 hrs/day, 5 days/week) to address physical and functional deficits in the context of the above medical diagnosis(es)? Yes Addressing deficits in the following areas: balance, endurance, locomotion, strength, transferring, dressing, toileting, speech and psychosocial support 5. Can the patient actively participate in an intensive therapy program of at least 3 hrs of therapy per day at  least 5 days per week? Yes 6. The potential for patient to make measurable gains while on inpatient rehab is excellent and good 7. Anticipated functional outcomes upon discharge from inpatient rehab are  modified independent and supervision with PT, modified independent with OT, modified independent with SLP. 8. Estimated rehab length of stay to reach the above functional goals is: 9-13 days. 9. Does the patient have adequate social supports and living environment to accommodate these discharge functional goals? Yes 10. Anticipated D/C setting: Home 11. Anticipated post D/C treatments: HH therapy and Home excercise program 12. Overall Rehab/Functional Prognosis: good  RECOMMENDATIONS: This patient's condition is appropriate for continued rehabilitative care in the following setting: CIR after completion of medical workup (TEE pending).    Patient has agreed to participate in recommended program. Yes Note that insurance prior authorization may be required for reimbursement for recommended care.  Comment: Rehab Admissions Coordinator to follow up.  Delice Lesch, MD 12/16/2015       Revision History     Date/Time User Provider Type Action   12/16/2015 1:23 PM Ankit Lorie Phenix, MD Physician Sign   12/16/2015 11:54 AM Bary Leriche, PA-C Physician Assistant Share   View Details Report       Routing History     Date/Time From To Method   12/16/2015 1:23 PM Ankit Lorie Phenix, MD Deland Pretty, MD Fax

## 2015-12-17 NOTE — Progress Notes (Signed)
I met with pt, her spouse and daughter at bedside. We discussed a possible inpt rehab admission pending insurance approval and bed available today or tomorrow. They prefer inpt rehab rather than SNF at this time. LOOP placement pending today. I await possible admit today or tomorrow hopefully. [phone]

## 2015-12-18 ENCOUNTER — Inpatient Hospital Stay (HOSPITAL_COMMUNITY): Payer: PPO | Admitting: Occupational Therapy

## 2015-12-18 ENCOUNTER — Inpatient Hospital Stay (HOSPITAL_COMMUNITY): Payer: PPO | Admitting: Speech Pathology

## 2015-12-18 ENCOUNTER — Inpatient Hospital Stay (HOSPITAL_COMMUNITY): Payer: PPO | Admitting: Physical Therapy

## 2015-12-18 ENCOUNTER — Encounter (HOSPITAL_COMMUNITY): Payer: Self-pay | Admitting: Cardiology

## 2015-12-18 ENCOUNTER — Other Ambulatory Visit: Payer: Self-pay | Admitting: Neurology

## 2015-12-18 DIAGNOSIS — I10 Essential (primary) hypertension: Secondary | ICD-10-CM

## 2015-12-18 DIAGNOSIS — I63411 Cerebral infarction due to embolism of right middle cerebral artery: Secondary | ICD-10-CM

## 2015-12-18 LAB — COMPREHENSIVE METABOLIC PANEL WITH GFR
ALT: 33 U/L (ref 14–54)
AST: 31 U/L (ref 15–41)
Albumin: 3.4 g/dL — ABNORMAL LOW (ref 3.5–5.0)
Alkaline Phosphatase: 52 U/L (ref 38–126)
Anion gap: 9 (ref 5–15)
BUN: 12 mg/dL (ref 6–20)
CO2: 28 mmol/L (ref 22–32)
Calcium: 9.3 mg/dL (ref 8.9–10.3)
Chloride: 105 mmol/L (ref 101–111)
Creatinine, Ser: 0.66 mg/dL (ref 0.44–1.00)
GFR calc Af Amer: >60 mL/min
GFR calc non Af Amer: >60 mL/min
Glucose, Bld: 101 mg/dL — ABNORMAL HIGH (ref 65–99)
Potassium: 4.2 mmol/L (ref 3.5–5.1)
Sodium: 142 mmol/L (ref 135–145)
Total Bilirubin: 0.6 mg/dL (ref 0.3–1.2)
Total Protein: 6.2 g/dL — ABNORMAL LOW (ref 6.5–8.1)

## 2015-12-18 LAB — CBC WITH DIFFERENTIAL/PLATELET
Basophils Absolute: 0 10*3/uL (ref 0.0–0.1)
Basophils Relative: 0 %
EOS PCT: 4 %
Eosinophils Absolute: 0.3 10*3/uL (ref 0.0–0.7)
HCT: 41.8 % (ref 36.0–46.0)
Hemoglobin: 13.7 g/dL (ref 12.0–15.0)
LYMPHS ABS: 2.5 10*3/uL (ref 0.7–4.0)
LYMPHS PCT: 37 %
MCH: 31.1 pg (ref 26.0–34.0)
MCHC: 32.8 g/dL (ref 30.0–36.0)
MCV: 95 fL (ref 78.0–100.0)
Monocytes Absolute: 0.7 10*3/uL (ref 0.1–1.0)
Monocytes Relative: 11 %
Neutro Abs: 3.2 10*3/uL (ref 1.7–7.7)
Neutrophils Relative %: 48 %
PLATELETS: 226 10*3/uL (ref 150–400)
RBC: 4.4 MIL/uL (ref 3.87–5.11)
RDW: 12.6 % (ref 11.5–15.5)
WBC: 6.7 10*3/uL (ref 4.0–10.5)

## 2015-12-18 NOTE — Evaluation (Signed)
Physical Therapy Assessment and Plan  Patient Details  Name: KUMIKO FISHMAN MRN: [id number] Date of Birth: [birth year]  PT Diagnosis: Abnormal posture, Abnormality of gait, Cognitive deficits, Coordination disorder, Difficulty walking, Hemiparesis non-dominant and Muscle weakness Rehab Potential: Good ELOS: 7-10 days   Today's Date: 12/18/2015 PT Individual Time: 2355-7322 PT Individual Time Calculation (min): 70 min    Problem List:  Patient Active Problem List   Diagnosis Date Noted  . Stroke due to embolism of right middle cerebral artery ([place]) 12/17/2015  . Left hemiparesis ([place])   . Cerebrovascular accident (CVA) due to occlusion of cerebral artery ([place])   . History of CVA with residual deficit   . Prediabetes   . Right Basal ganglia infarction ([place]) 12/14/2015  . Acute CVA (cerebrovascular accident) ([place]) 12/14/2015  . SVT (supraventricular tachycardia) ([place]) 12/14/2015  . Stroke ([place]) 12/13/2015  . Stroke-like symptom 12/13/2015  . History of stroke   . Heart palpitations 09/06/2012  . Hypertension   . Hypercholesterolemia   . Hypothyroidism   . Vitamin D deficiency   . Palpitations   . History of dizziness   . LVH (left ventricular hypertrophy)   . Aortic stenosis   . Mitral regurgitation   . SOB (shortness of breath)   . Difficulty walking     Past Medical History:  Past Medical History  Diagnosis Date  . Hypertension     LIABLE  . Hypercholesterolemia   . Hypothyroidism   . Vitamin D deficiency   . Palpitations   . History of dizziness   . LVH (left ventricular hypertrophy)   . Aortic stenosis   . Mitral regurgitation   . SOB (shortness of breath)   . Difficulty walking   . Cerebrovascular accident (stroke) [facility])    Past Surgical History:  Past Surgical History  Procedure Laterality Date  . Cardiovascular stress test  2002    NORMAL  . Transthoracic echocardiogram  2008    SHOWED LEFT VENTRICULAR HYPERTROPHY AND MILD AORTIC STENOSIS  . Ep  implantable device N/A 12/17/2015    Procedure: Loop Recorder Insertion;  Surgeon: [name], MD;  Location: [place] CV LAB;  Service: Cardiovascular;  Laterality: N/A;    Assessment & Plan Clinical Impression: KARISS LONGMIRE is a 75 y.o. female with history of HTN, aortic stenosis, L-CVA 2014 with gait disorder and minimal RLE weakness who was admitted on 12/13/15 with left sided weakness and dysarthric speech. MRI/MRA brain done revealing acute infarct in right basal ganglia with mild progression of PCA atherosclerosis and no large vessel occlusion. 2D echo with EF 60-65% with mild LVH, no aortic or mitral stenosis and no wall abnormality. Carotid dopplers without significant ICA stenosis. ASA changed to plavix for stroked due to small vessel disease and uncontrolled hyperlipidemia as patient intolerant of statins. Crestor increased to 10 mg daily and low dose BB added due to recurrent SVT. Plans for TEE done 5/16 and showed normal LVF and no PFO. Loop recorder placed by Dr. Rayann Heman. Cognitive evaluation showed dysarthria and cognitive deficits to be at baseline. Patient with resultant LLE weakness with narrow BOS, visual deficits in left upper quadrant as well as delayed processing with slow initiation.  Patient transferred to CIR on 12/17/2015.   Patient currently requires min with mobility secondary to muscle weakness and muscle paralysis, decreased cardiorespiratoy endurance, impaired timing and sequencing, abnormal tone, unbalanced muscle activation and decreased coordination, impaired vision, decreased attention, decreased awareness, decreased problem solving, decreased safety awareness and decreased memory and  decreased standing balance, decreased postural control, hemiplegia and decreased balance strategies.  Prior to hospitalization, patient was independent  with mobility and lived with Spouse in a House home.  Home access is 2Stairs to enter.  Patient will benefit from skilled PT  intervention to maximize safe functional mobility, minimize fall risk and decrease caregiver burden for planned discharge home with 24 hour supervision.  Anticipate patient will benefit from follow up [place] at discharge.  PT - End of Session Activity Tolerance: Tolerates 30+ min activity with multiple rests;Decreased this session Endurance Deficit: Yes Endurance Deficit Description: required more rest breaks at end of session PT Assessment Rehab Potential (ACUTE/IP ONLY): Good PT Patient demonstrates impairments in the following area(s): Balance;Behavior;Endurance;Motor;Nutrition;Safety PT Transfers Functional Problem(s): Bed Mobility;Bed to Chair;Car;Furniture PT Locomotion Functional Problem(s): Ambulation;Wheelchair Mobility;Stairs PT Plan PT Intensity: Minimum of 1-2 x/day ,45 to 90 minutes PT Frequency: 5 out of 7 days PT Duration Estimated Length of Stay: 7-10 days PT Treatment/Interventions: Ambulation/gait training;Balance/vestibular training;Cognitive remediation/compensation;Community reintegration;Discharge planning;Disease management/prevention;DME/adaptive equipment instruction;Functional mobility training;Functional electrical stimulation;Neuromuscular re-education;Pain management;Patient/family education;Psychosocial support;Stair training;Therapeutic Activities;Therapeutic Exercise;UE/LE Strength taining/ROM;UE/LE Coordination activities PT Transfers Anticipated Outcome(s): mod I PT Locomotion Anticipated Outcome(s): supervision PT Recommendation Recommendations for Other Services: Neuropsych consult Follow Up Recommendations: Home health PT Patient destination: Home Equipment Recommended: To be determined  Skilled Therapeutic Intervention Skilled therapeutic intervention initiated after completion of evaluation. Discussed with patient and husband falls risk, safety within room, and focus of therapy during stay. Discussed possible length of stay, goals, and follow-up therapy.  Patient verbose throughout session but easily redirected to functional tasks. Patient required min-mod A overall with HHA and max cues for increased L step length and upright posture/forward gaze and to minimize reaching for objects while ambulating. Patient fatigued at end of session, required prolonged rest break and transferred back to bed with min A and sit > supine with max A, patient instructed in hooking RLE under LLE to bring on bed but required assist to lift BLE onto bed and to lower trunk to bed, needs in reach and bed alarm on and husband in room.   PT Evaluation Precautions/Restrictions Precautions Precautions: Fall Restrictions Weight Bearing Restrictions: No General Chart Reviewed: Yes Family/Caregiver Present: Yes  Pain Pain Assessment Pain Assessment: No/denies pain Pain Score: 0-No pain Home Living/Prior Functioning Home Living Available Help at Discharge: Family;Available 24 hours/day Type of Home: House Home Access: Stairs to enter [employer] of Steps: 2 Entrance Stairs-Rails: None Home Layout: Two level;Able to live on main level with bedroom/bathroom Alternate Level Stairs-Number of Steps: 1 flight Alternate Level Stairs-Rails: Right;Left Bathroom Shower/Tub: Multimedia programmer: Handicapped height Bathroom Accessibility: Yes  Lives With: Spouse Prior Function Level of Independence: Independent with basic ADLs;Independent with gait;Independent with homemaking with ambulation;Independent with transfers  Able to Take Stairs?: Yes Driving: Yes Vocation: Retired Leisure: Hobbies-yes (Comment) Comments: paint, play the piano, read Vision/Perception   Defer to OT evaluation  Cognition Overall Cognitive Status: Impaired/Different from baseline Arousal/Alertness: Awake/alert Orientation Level: Oriented to person;Oriented to place;Oriented to situation;Disoriented to time Attention: Sustained Sustained Attention: Impaired Sustained  Attention Impairment: Verbal complex Memory: Impaired Memory Impairment: Retrieval deficit;Storage deficit;Decreased recall of new information Awareness: Impaired Awareness Impairment: Emergent impairment Problem Solving: Impaired Problem Solving Impairment: Verbal complex Executive Function: Sequencing;Reasoning;Organizing Reasoning: Impaired Reasoning Impairment: Verbal complex Sequencing: Impaired Sequencing Impairment: Verbal complex Organizing: Impaired Organizing Impairment: Verbal complex Safety/Judgment: Appears intact Sensation Sensation Light Touch: Appears Intact Stereognosis: Not tested Hot/Cold: Appears Intact Proprioception: Appears Intact Coordination Gross Motor Movements  are Fluid and Coordinated: No Fine Motor Movements are Fluid and Coordinated: No Coordination and Movement Description: L sided weakness Heel Shin Test: WFL, slightly less ROM LLE due to hip flexor weakness Motor  Motor Motor: Hemiplegia  Mobility Bed Mobility Bed Mobility: Supine to Sit;Sit to Supine Supine to Sit: 4: Min assist Sit to Supine: 2: Max assist Transfers Transfers: Yes Sit to Stand: 4: Min assist Stand to Sit: 4: Min assist Locomotion  Ambulation Ambulation: Yes Ambulation/Gait Assistance: 4: Min assist Ambulation Distance (Feet): 200 Feet Assistive device: 1 person hand held assist Ambulation/Gait Assistance Details: Verbal cues for gait pattern Gait Gait: Yes Gait Pattern: Impaired Gait Pattern: Step-through pattern;Decreased step length - left;Decreased weight shift to left;Trunk flexed;Poor foot clearance - left Gait velocity: 10 MWT = 0.33 m/s Stairs / Additional Locomotion Stairs: Yes Stairs Assistance: 4: Min assist Stair Management Technique: Two rails;Step to pattern;Forwards Number of Stairs: 12 Height of Stairs: 3 (8 3", 6 4") Ramp: 3: Mod assist Curb: 3: Mod assist Wheelchair Mobility Wheelchair Mobility: No  Trunk/Postural Assessment  Cervical  Assessment Cervical Assessment: Within Functional Limits Thoracic Assessment Thoracic Assessment: Exceptions to [facility] (kyphotic) Lumbar Assessment Lumbar Assessment: Exceptions to [facility] (posterior pelvic tilt) Postural Control Postural Control: Deficits on evaluation Protective Responses: delayed/impaired  Balance Balance Balance Assessed: Yes Static Standing Balance Static Standing - Balance Support: Left upper extremity supported;During functional activity Static Standing - Level of Assistance: 4: Min assist Dynamic Standing Balance Dynamic Standing - Balance Support: During functional activity;Left upper extremity supported Dynamic Standing - Level of Assistance: 3: Mod assist Extremity Assessment  RLE Assessment RLE Assessment: Within Functional Limits LLE Assessment LLE Assessment: Exceptions to [facility] LLE Strength LLE Overall Strength: Deficits LLE Overall Strength Comments: grossly 4+/5 except hip flexion 3-/5   See Function Navigator for Current Functional Status.   Refer to Care Plan for Long Term Goals  Recommendations for other services: Neuropsych  Discharge Criteria: Patient will be discharged from PT if patient refuses treatment 3 consecutive times without medical reason, if treatment goals not met, if there is a change in medical status, if patient makes no progress towards goals or if patient is discharged from hospital.  The above assessment, treatment plan, treatment alternatives and goals were discussed and mutually agreed upon: by patient and by family  Laretta Alstrom 12/18/2015, 11:07 AM

## 2015-12-18 NOTE — Evaluation (Signed)
Occupational Therapy Assessment and Plan  Patient Details  Name: Stacey Ramirez MRN: [id number] Date of Birth: 04-30-1941  OT Diagnosis: cognitive deficits, hemiplegia affecting non-dominant side and muscle weakness (generalized) Rehab Potential: Rehab Potential (ACUTE ONLY): Good ELOS: 10-12 days   Today's Date: 12/18/2015 OT Individual Time: 1430-1530 OT Individual Time Calculation (min): 60 min     Problem List:  Patient Active Problem List   Diagnosis Date Noted  . Stroke due to embolism of right middle cerebral artery ([place]) 12/17/2015  . Left hemiparesis ([place])   . Cerebrovascular accident (CVA) due to occlusion of cerebral artery ([place])   . History of CVA with residual deficit   . Prediabetes   . Right Basal ganglia infarction ([place]) 12/14/2015  . Acute CVA (cerebrovascular accident) ([place]) 12/14/2015  . SVT (supraventricular tachycardia) ([place]) 12/14/2015  . Stroke ([place]) 12/13/2015  . Stroke-like symptom 12/13/2015  . History of stroke   . Heart palpitations 09/06/2012  . Hypertension   . Hypercholesterolemia   . Hypothyroidism   . Vitamin D deficiency   . Palpitations   . History of dizziness   . LVH (left ventricular hypertrophy)   . Aortic stenosis   . Mitral regurgitation   . SOB (shortness of breath)   . Difficulty walking     Past Medical History:  Past Medical History  Diagnosis Date  . Hypertension     LIABLE  . Hypercholesterolemia   . Hypothyroidism   . Vitamin D deficiency   . Palpitations   . History of dizziness   . LVH (left ventricular hypertrophy)   . Aortic stenosis   . Mitral regurgitation   . SOB (shortness of breath)   . Difficulty walking   . Cerebrovascular accident (stroke) [facility])    Past Surgical History:  Past Surgical History  Procedure Laterality Date  . Cardiovascular stress test  2002    NORMAL  . Transthoracic echocardiogram  2008    SHOWED LEFT VENTRICULAR HYPERTROPHY AND MILD AORTIC STENOSIS  . Ep implantable device N/A  12/17/2015    Procedure: Loop Recorder Insertion;  Surgeon: [name], MD;  Location: [place] CV LAB;  Service: Cardiovascular;  Laterality: N/A;    Assessment & Plan Clinical Impression: Patient is a 75 y.o. year old female female with history of HTN, aortic stenosis, L-CVA 2014 with gait disorder and minimal RLE weakness who was admitted on 12/13/15 with left sided weakness and dysarthric speech. MRI/MRA brain done revealing acute infarct in right basal ganglia with mild progression of PCA atherosclerosis and no large vessel occlusion. 2D echo with EF 60-65% with mild LVH, no aortic or mitral stenosis and no wall abnormality. Carotid dopplers without significant ICA stenosis. ASA changed to plavix for stroked due to small vessel disease and uncontrolled hyperlipidemia as patient intolerant of statins. Crestor increased to 10 mg daily and low dose BB added due to recurrent SVT. Plans for TEE done 5/16 and showed normal LVF and no PFO. Loop recorder placed by Dr. Rayann Heman. Cognitive evaluation showed dysarthria and cognitive deficits to be at baseline. Patient with resultant LLE weakness with narrow BOS, visual deficits in left upper quadrant as well as delayed processing with slow initiation.  Patient transferred to CIR on 12/17/2015 .    Patient currently requires min to mod A  with basic self-care skills and basic mobility  secondary to muscle weakness, decreased cardiorespiratoy endurance, unbalanced muscle activation and decreased coordination, decreased visual disturbances, decreased initiation, decreased attention, decreased awareness, decreased problem solving, decreased  safety awareness, decreased memory and delayed processing and decreased sitting balance, decreased standing balance, decreased postural control, hemiplegia and decreased balance strategies.  Prior to hospitalization, patient could complete ADL with modified independent .  Patient will benefit from skilled intervention to  decrease level of assist with basic self-care skills and increase independence with basic self-care skills prior to discharge home with care partner.  Anticipate patient will require 24 hour supervision and follow up outpatient.  OT - End of Session Endurance Deficit: Yes Endurance Deficit Description: required more rest breaks at end of session OT Assessment Rehab Potential (ACUTE ONLY): Good OT Patient demonstrates impairments in the following area(s): Balance;Safety;Cognition;Skin Integrity;Vision;Edema;Endurance;Motor;Nutrition;Pain;Perception OT Basic ADL's Functional Problem(s): Grooming;Bathing;Dressing;Toileting OT Transfers Functional Problem(s): Toilet;Tub/Shower OT Additional Impairment(s): Fuctional Use of Upper Extremity OT Plan OT Intensity: Minimum of 1-2 x/day, 45 to 90 minutes OT Frequency: 5 out of 7 days OT Duration/Estimated Length of Stay: 10-12 days OT Treatment/Interventions: Balance/vestibular training;Cognitive remediation/compensation;Community reintegration;DME/adaptive equipment instruction;Neuromuscular re-education;Psychosocial support;Splinting/orthotics;UE/LE Strength taining/ROM;Visual/perceptual remediation/compensation;Therapeutic Exercise;Skin care/wound managment;Patient/family education;Functional mobility training;Disease mangement/prevention;Discharge planning;Pain management;Self Care/advanced ADL retraining;Therapeutic Activities;UE/LE Coordination activities OT Self Feeding Anticipated Outcome(s): n/a OT Basic Self-Care Anticipated Outcome(s): supervision  OT Toileting Anticipated Outcome(s): supervision OT Bathroom Transfers Anticipated Outcome(s): supervision OT Recommendation Recommendations for Other Services: Neuropsych consult Patient destination: Home Follow Up Recommendations: Outpatient OT Equipment Recommended: To be determined   Skilled Therapeutic Intervention OT eval initaited with OT goals, purpose and role discussed with pt and  pt's husband. Pt needed encouragement to participate due to fatigue this session. Self care retraining at shower level with focus on functional ambulation without AE with mod A with HHA. Pt required cues to complete one task before transitioning to the next.  Pt did demonstrate double voiding in session with urine output. Pt required mod cues for task. organization to complete tasks. Transitioned to sink to dress sit to stand. Pt with continual left lean requiring mod cuing throughout session to return to midline. Used mirror with dressing for visual feedback to assist with visual feedback. A pt with drying hair due to fatigue. Pt left in w/c to rest after session. Will continue to assess vision - pt reading book and with decr complaints of visual disturbance and was able to track in all quadrants today.   OT Evaluation Precautions/Restrictions  Precautions Precautions: Fall Precaution Comments: leans to the left Restrictions Weight Bearing Restrictions: No General Chart Reviewed: Yes Family/Caregiver Present: No Vital Signs   Pain   Home Living/Prior Functioning Home Living Living Arrangements: Spouse/significant other Available Help at Discharge: Family, Available 24 hours/day Type of Home: House Home Access: Stairs to enter [employer] of Steps: 2 Entrance Stairs-Rails: None Home Layout: Two level, Able to live on main level with bedroom/bathroom Alternate Level Stairs-Number of Steps: 1 flight Alternate Level Stairs-Rails: Right, Left Bathroom Shower/Tub: Multimedia programmer: Handicapped height Bathroom Accessibility: Yes  Lives With: Spouse Prior Function Level of Independence: Independent with basic ADLs, Independent with gait, Independent with homemaking with ambulation, Independent with transfers  Able to Take Stairs?: Yes Driving: Yes Vocation: Retired Leisure: Hobbies-yes (Comment) Comments: paint, play the piano, read ADL ADL ADL Comments: see  functional navigator Vision/Perception  Vision- History Baseline Vision/History: Wears glasses Patient Visual Report: No change from baseline Vision- Assessment Vision Assessment?: Yes;Vision impaired- to be further tested in functional context Ocular Range of Motion: Within Functional Limits Alignment/Gaze Preference: Within Defined Limits  Cognition Overall Cognitive Status: Impaired/Different from baseline Arousal/Alertness: Awake/alert Orientation Level: Person;Place;Situation Year: 2017 Month: May  Day of Week: Correct Memory: Impaired Memory Impairment: Retrieval deficit;Storage deficit;Decreased recall of new information Immediate Memory Recall: Sock;Blue;Bed Memory Recall:  (0/3) Attention: Sustained Sustained Attention: Impaired Sustained Attention Impairment: Functional basic Awareness: Impaired Awareness Impairment: Emergent impairment Problem Solving: Impaired Problem Solving Impairment: Functional basic Executive Function: Sequencing;Reasoning;Organizing Reasoning: Impaired Reasoning Impairment: Verbal complex Sequencing: Impaired Sequencing Impairment: Verbal complex Organizing: Impaired Organizing Impairment: Verbal complex Safety/Judgment: Appears intact Sensation Sensation Light Touch: Appears Intact Hot/Cold: Appears Intact Proprioception: Appears Intact Coordination Gross Motor Movements are Fluid and Coordinated: No Coordination and Movement Description: L sided weakness Motor  Motor Motor: Hemiplegia;Abnormal postural alignment and control Mobility  Transfers Transfers: Sit to Stand;Stand to Sit Sit to Stand: 4: Min assist Stand to Sit: 4: Min assist  Trunk/Postural Assessment  Cervical Assessment Cervical Assessment: Within Functional Limits Thoracic Assessment Thoracic Assessment: Exceptions to [facility] (kyphotic) Lumbar Assessment Lumbar Assessment:  (posterior pelvic tilt) Postural Control Postural Control: Deficits on evaluation Righting  Reactions: delayed - leans to the left  Protective Responses: delayed/impaired  Balance Dynamic Sitting Balance Sitting balance - Comments: Focused on midline positioning. Static Standing Balance Static Standing - Balance Support: During functional activity Static Standing - Level of Assistance: 4: Min assist Dynamic Standing Balance Dynamic Standing - Balance Support: During functional activity;Left upper extremity supported Dynamic Standing - Level of Assistance: 3: Mod assist Extremity/Trunk Assessment RUE Assessment RUE Assessment: Within Functional Limits LUE Assessment LUE Assessment:  (grossly 4/5 )   See Function Navigator for Current Functional Status.   Refer to Care Plan for Long Term Goals  Recommendations for other services: Neuropsych  Discharge Criteria: Patient will be discharged from OT if patient refuses treatment 3 consecutive times without medical reason, if treatment goals not met, if there is a change in medical status, if patient makes no progress towards goals or if patient is discharged from hospital.  The above assessment, treatment plan, treatment alternatives and goals were discussed and mutually agreed upon: by patient and by family  Nicoletta Ba 12/18/2015, 7:02 PM

## 2015-12-18 NOTE — Evaluation (Signed)
Speech Language Pathology Assessment and Plan  Patient Details  Name: Stacey Ramirez MRN: [id number] Date of Birth: 1941/03/13  SLP Diagnosis: Cognitive Impairments;Dysarthria;Dysphagia  Rehab Potential: Good ELOS: 7-10 days    Today's Date: 12/18/2015 SLP Individual Time: 0900-1000 SLP Individual Time Calculation (min): 60 min   Problem List:  Patient Active Problem List   Diagnosis Date Noted  . Stroke due to embolism of right middle cerebral artery ([place]) 12/17/2015  . Left hemiparesis ([place])   . Cerebrovascular accident (CVA) due to occlusion of cerebral artery ([place])   . History of CVA with residual deficit   . Prediabetes   . Right Basal ganglia infarction ([place]) 12/14/2015  . Acute CVA (cerebrovascular accident) ([place]) 12/14/2015  . SVT (supraventricular tachycardia) ([place]) 12/14/2015  . Stroke ([place]) 12/13/2015  . Stroke-like symptom 12/13/2015  . History of stroke   . Heart palpitations 09/06/2012  . Hypertension   . Hypercholesterolemia   . Hypothyroidism   . Vitamin D deficiency   . Palpitations   . History of dizziness   . LVH (left ventricular hypertrophy)   . Aortic stenosis   . Mitral regurgitation   . SOB (shortness of breath)   . Difficulty walking    Past Medical History:  Past Medical History  Diagnosis Date  . Hypertension     LIABLE  . Hypercholesterolemia   . Hypothyroidism   . Vitamin D deficiency   . Palpitations   . History of dizziness   . LVH (left ventricular hypertrophy)   . Aortic stenosis   . Mitral regurgitation   . SOB (shortness of breath)   . Difficulty walking   . Cerebrovascular accident (stroke) [facility])    Past Surgical History:  Past Surgical History  Procedure Laterality Date  . Cardiovascular stress test  2002    NORMAL  . Transthoracic echocardiogram  2008    SHOWED LEFT VENTRICULAR HYPERTROPHY AND MILD AORTIC STENOSIS  . Ep implantable device N/A 12/17/2015    Procedure: Loop Recorder Insertion;  Surgeon: [name],  MD;  Location: [place] [facility];  Service: Cardiovascular;  Laterality: N/A;    Assessment / Plan / Recommendation Clinical Impression  Pt is a 75 y/o f admitted with R basal ganglia infarct. PMH is significant for previous L CVA without significant residual deficits. Report of premorbid memory impairment which pt compensated for by taking notes. Pt presents with mild oral dysphagia characterized by mild L buccal pocketing, mild dysarthria with pt awareness and mild- moderate cognitive impairments affecting memory, organization and functional reasoning. Pt would benefit from SLP services to maximize functional independence for cognitive-linguistic activities pt was participating in prior to stroke.   Skilled Therapeutic Interventions          Pt participated with the MoCA 7.2 and scored 21/30 consistent with cognitive impairment. Pt frequently self-distracted throughout assessment and required frequent redirection to attend to task.    SLP Assessment  Patient will need skilled Speech Lanaguage Pathology Services during CIR admission    Recommendations  SLP Diet Recommendations: Age appropriate regular solids Liquid Administration via: Straw;Cup Medication Administration: Whole meds with liquid Supervision: Patient able to self feed;Intermittent supervision to cue for compensatory strategies Compensations: Minimize environmental distractions;Lingual sweep for clearance of pocketing;Monitor for anterior loss Postural Changes and/or Swallow Maneuvers: Seated upright 90 degrees Oral Care Recommendations: Oral care BID Patient destination: Home Follow up Recommendations: Outpatient SLP Equipment Recommended: None recommended by SLP    SLP Frequency 3 to 5 out of 7 days  SLP Duration  SLP Intensity  SLP Treatment/Interventions 7-10 days  Minumum of 1-2 x/day, 30 to 90 minutes  Cognitive remediation/compensation;Cueing hierarchy;Multimodal communication approach;Speech/Language  facilitation;Functional tasks;Patient/family education;Medication managment;Dysphagia/aspiration precaution training    Pain Pain Assessment Pain Assessment: No/denies pain  Prior Functioning Cognitive/Linguistic Baseline: Baseline deficits Baseline deficit details: memory Type of Home: House  Lives With: Spouse Available Help at Discharge: Family;Available 24 hours/day Vocation: Retired  Function:  Eating Eating   Modified Consistency Diet: No Eating Assist Level: Swallowing techniques: self managed;Supervision or verbal cues           Cognition Comprehension Comprehension assist level: Follows basic conversation/direction with no assist  Expression   Expression assist level: Expresses complex 90% of the time/cues < 10% of the time  Social Interaction Social Interaction assist level: Interacts appropriately 90% of the time - Needs monitoring or encouragement for participation or interaction.  Problem Solving Problem solving assist level: Solves basic 75 - 89% of the time/requires cueing 10 - 24% of the time  Memory Memory assist level: Recognizes or recalls 50 - 74% of the time/requires cueing 25 - 49% of the time   Short Term Goals: Week 1: SLP Short Term Goal 1 (Week 1): Pt complete medication management tasks with min A. SLP Short Term Goal 2 (Week 1): Pt complete calendar management tasks with min A. SLP Short Term Goal 3 (Week 1): Pt demonstrate functional sequencing of a multi-step process with min A. SLP Short Term Goal 4 (Week 1): Pt demonstrate self-monitoring/correction to improve speech clarity with compensatory strategies with min A at the conversational level.  SLP Short Term Goal 5 (Week 1): Pt tolerate regular diet consistency at mod I level for management of oral dysphagia. SLP Short Term Goal 6 (Week 1): Pt demonstrate sustained attention to task for 10 minutes with min verbal cueing.  Refer to Care Plan for Long Term Goals  Recommendations for other  services: None  Discharge Criteria: Patient will be discharged from SLP if patient refuses treatment 3 consecutive times without medical reason, if treatment goals not met, if there is a change in medical status, if patient makes no progress towards goals or if patient is discharged from hospital.  The above assessment, treatment plan, treatment alternatives and goals were discussed and mutually agreed upon: by patient and by family  Vinetta Bergamo MA, [place] 12/18/2015, 2:22 PM

## 2015-12-18 NOTE — Progress Notes (Signed)
STROKE TEAM PROGRESS NOTE   SUBJECTIVE (INTERVAL HISTORY) The patient's husband and daughter were at the bedside. The patient just came back from TEE and loop recorder placement. Her left facial droop seems better. Husband said she worked with PT and seems left sided weakness also getting better.    OBJECTIVE Temp:  [97.7 F (36.5 C)-98.5 F (36.9 C)] 97.8 F (36.6 C) (05/17 0442) Pulse Rate:  [61-86] 66 (05/17 0442) Cardiac Rhythm:  [-] Normal sinus rhythm (05/16 0945) Resp:  [13-22] 18 (05/17 0442) BP: (151-242)/(60-115) 154/77 mmHg (05/17 0442) SpO2:  [95 %-100 %] 98 % (05/17 0442) Weight:  [115 lb (52.164 kg)] 115 lb (52.164 kg) (05/16 0822)  CBC:   Recent Labs Lab 12/15/15 0545 12/17/15 0219 12/18/15 0428  WBC 6.0 5.8 6.7  NEUTROABS 2.8  --  3.2  HGB 13.5 13.6 13.7  HCT 41.6 41.0 41.8  MCV 96.1 94.0 95.0  PLT 207 219 [id number]    Basic Metabolic Panel:   Recent Labs Lab 12/15/15 0545 12/17/15 0219  NA 143 141  K 3.5 4.0  CL 107 104  CO2 27 29  GLUCOSE 91 105*  BUN 12 10  CREATININE 0.66 0.79  CALCIUM 9.3 9.6  MG 2.2  --     Lipid Panel:     Component Value Date/Time   CHOL 315* 12/13/2015 1558   CHOL 331* 04/14/2013 0921   TRIG 59 12/13/2015 1558   HDL 65 12/13/2015 1558   HDL 65 04/14/2013 0921   CHOLHDL 4.8 12/13/2015 1558   CHOLHDL 5.1* 04/14/2013 0921   VLDL 12 12/13/2015 1558   LDLCALC 238* 12/13/2015 1558   LDLCALC 244* 04/14/2013 0921   HgbA1c:  Lab Results  Component Value Date   HGBA1C 5.7* 12/13/2015   Urine Drug Screen: No results found for: LABOPIA, COCAINSCRNUR, LABBENZ, AMPHETMU, THCU, LABBARB    IMAGING I have personally reviewed the radiological images below and agree with the radiology interpretations.  Dg Chest 2 View 12/13/2015   No edema or consolidation.   Ct Head Wo Contrast 12/13/2015   No acute abnormality. Atrophy and chronic microvascular ischemic change.   Mri and Mra Head/brain Wo Cm 12/13/2015   1. Confluent  acute infarct in the right basal ganglia with no associated hemorrhage or mass effect. Superimposed punctate posterior right MCA periventricular white matter infarct.  2. Negative for emergent large vessel occlusion. Anterior circulation atherosclerosis appears stable since 2014, including at least moderate stenosis of the supraclinoid right ICA (series 705, image 6).  3. Mild progression of posterior circulation atherosclerosis since 2014 including new mild stenosis of the mid basilar artery and moderate stenosis of the right PCA P1 segment.  4. Underlying chronic small vessel ischemia, moderate for age.   LE venous doppler - Lower extremity venous duplex has been completed. Preliminary findings: No evidence of DVT or baker's cyst.  CUS - Bilateral: 1-39% ICA stenosis. Vertebral artery flow is antegrade.  TTE - Left ventricle: The cavity size was normal. Wall thickness was  increased in a pattern of mild LVH. Systolic function was normal.  The estimated ejection fraction was in the range of 60% to 65%.  Wall motion was normal; there were no regional wall motion  abnormalities. Doppler parameters are consistent with abnormal  left ventricular relaxation (grade 1 diastolic dysfunction). - Aortic valve: There was no stenosis. - Mitral valve: There was no significant regurgitation. - Right ventricle: The cavity size was normal. Systolic function  was normal. - Pulmonary arteries: No  complete TR doppler jet so unable to  estimate PA systolic pressure. - Inferior vena cava: The vessel was normal in size. The  respirophasic diameter changes were in the normal range (>= 50%),  consistent with normal central venous pressure. Impressions: - Normal LV size with mild LV hypertrophy. EF 60-65%. Normal RV  size and systolic function. No significant valvular  abnormalities.  TEE - normal LV function; negative saline microcavitation study   PHYSICAL EXAM  Physical exam: Exam: Gen:  NAD         CV: RRR, no MRG. No Carotid Bruits. No peripheral edema, warm, nontender Eyes: Conjunctivae clear without exudates or hemorrhage  Neuro: Detailed Neurologic Exam  Speech:    Speech is normal; with normal comprehension.  Cognition:    The patient is oriented to person, place, and time;    Cranial Nerves:    The pupils are equal, round, and reactive to light. The fundi are normal and spontaneous venous pulsations are present. Visual fields are full to finger confrontation. Extraocular movements are intact. Trigeminal sensation is intact and the muscles of mastication are normal. Lower left sided facial weakness. The palate elevates in the midline. Hearing intact to voice. Voice is normal. Shoulder shrug is normal. The tongue has normal motion without fasciculations.   Coordination:    No dysmetria, left-sided weakness   Motor Observation:    No asymmetry, no atrophy, and no involuntary movements noted. Tone:    Normal muscle tone.    Posture:    Posture is normal. normal erect    Strength: Left-sided mild weakness of the UE and LE 4/5 otherwise strength is V/V in the upper and lower limbs.      Sensation: intact to LT  Gait: patient able to stand and walk independently. Stooped posture, left hemiparetic gait with small cautious steps with physical therapy.     ASSESSMENT/PLAN Stacey Ramirez is a 75 y.o. female with history of hypertension, hyperlipidemia, hypothyroidism, aortic stenosis, mitral regurgitation, and previous stroke presenting with left hemiparesis.. She did not receive IV t-PA due to late presentation.  Stroke:  Right large BG and punctate periventricular WM infarcts, possibly embolic from an unknown source  Resultant  Left sided face, arm and leg  MRI  Acute infarct in the right basal ganglia & punctate posterior right MCA periventricular white matter infarcts  MRA  moderate stenosis of the supraclinoid right ICA - moderate stenosis of the right PCA  P1 and left P2 segments.   Carotid Doppler - unremarkable  TTE - EF 60-65%  LE venous doppler - no DVT  TEE unremarkable and loop recorder placed.  LDL 238  HgbA1c 5.7  VTE prophylaxis - Lovenox Diet - low sodium heart healthy  aspirin 81 mg daily prior to admission, now on aspirin 325 mg daily. Due to intracranial stenosis, recommend DAPT with ASA and Plavix for 3 months and then plavix alone.    Patient counseled to be compliant with her antithrombotic medications  Ongoing aggressive stroke risk factor management  Therapy recommendations:   CIR  Disposition:  Pending  Palpitation  Hx of SVT and palpitation  30 day cardiac event monitoring negative  loop recorder placed  Hx of stroke   0000000 left PLIC/CR/thalamus infarct  MRA negative   EF 60-65%  30 day monitoring negative for afib  Put on ASA and crestor  Hypertension  Permissive hypertension (OK if < 220/120) but gradually normalize in 5-7 days  stable  Hyperlipidemia  Home meds:  Crestor 2.5 mg daily resumed in hospital  LDL 238, goal < 70  Patient has h/o statin intolerance hence recommend new PCSK9 inhibitor like Praluent or Repathar. Currently on Crestor 10 mg daily.  Continue statin at discharge unless patient develops myalgias.  Other Stroke Risk Factors  Advanced age  ETOH use  Other [facility] day # 3   Neurology will sign off. Please call with questions. Pt will follow up with Dr. Erlinda Hong at [facility] in about 2 months. Thanks for the consult.   Rosalin Hawking, MD PhD Stroke Neurology 12/18/2015 5:18 AM    To contact Stroke Continuity provider, please refer to [email or web address] After hours, contact General Neurology

## 2015-12-18 NOTE — Progress Notes (Signed)
Patient information reviewed and entered into eRehab system by Arsema Tusing, RN, CRRN, PPS Coordinator.  Information including medical coding and functional independence measure will be reviewed and updated through discharge.     Per nursing patient was given "Data Collection Information Summary for Patients in Inpatient Rehabilitation Facilities with attached "Privacy Act Statement-Health Care Records" upon admission.  

## 2015-12-18 NOTE — Progress Notes (Signed)
Social Work  Social Work Assessment and Plan  Patient Details  Name: Stacey Ramirez MRN: [id number] Date of Birth: Jul 11, 1941  Today's Date: 12/18/2015  Problem List:  Patient Active Problem List   Diagnosis Date Noted  . Stroke due to embolism of right middle cerebral artery ([place]) 12/17/2015  . Left hemiparesis ([place])   . Cerebrovascular accident (CVA) due to occlusion of cerebral artery ([place])   . History of CVA with residual deficit   . Prediabetes   . Right Basal ganglia infarction ([place]) 12/14/2015  . Acute CVA (cerebrovascular accident) ([place]) 12/14/2015  . SVT (supraventricular tachycardia) ([place]) 12/14/2015  . Stroke ([place]) 12/13/2015  . Stroke-like symptom 12/13/2015  . History of stroke   . Heart palpitations 09/06/2012  . Hypertension   . Hypercholesterolemia   . Hypothyroidism   . Vitamin D deficiency   . Palpitations   . History of dizziness   . LVH (left ventricular hypertrophy)   . Aortic stenosis   . Mitral regurgitation   . SOB (shortness of breath)   . Difficulty walking    Past Medical History:  Past Medical History  Diagnosis Date  . Hypertension     LIABLE  . Hypercholesterolemia   . Hypothyroidism   . Vitamin D deficiency   . Palpitations   . History of dizziness   . LVH (left ventricular hypertrophy)   . Aortic stenosis   . Mitral regurgitation   . SOB (shortness of breath)   . Difficulty walking   . Cerebrovascular accident (stroke) [facility])    Past Surgical History:  Past Surgical History  Procedure Laterality Date  . Cardiovascular stress test  2002    NORMAL  . Transthoracic echocardiogram  2008    SHOWED LEFT VENTRICULAR HYPERTROPHY AND MILD AORTIC STENOSIS  . Ep implantable device N/A 12/17/2015    Procedure: Loop Recorder Insertion;  Surgeon: [name], MD;  Location: [place] [facility];  Service: Cardiovascular;  Laterality: N/A;   Social History:  reports that she has never smoked. She does not have any smokeless tobacco history on  file. She reports that she drinks alcohol. She reports that she does not use illicit drugs.  Family / Support Systems Marital Status: Married How Long?: 88 yrs Patient Roles: Spouse, Parent, Other (Comment) (Grandparent) Spouse/Significant Other: [name] [place] @ (H) [phone] or (C) [phone] Children: Two adult children:  Tammy ([place]) and Legrand [place] Engineer, drilling) Anticipated Caregiver: spouse Ability/Limitations of Caregiver: no limitations Caregiver Availability: 24/7 Family Dynamics: Husband at bedside and very encouraging when pt becomes tearful about her limitations.  He reports he is prepared to assist in any way.  Social History Preferred language: English Religion: Unknown Cultural Background: NA  Read: Yes Write: Yes Employment Status: Retired Freight forwarder Issues: None Guardian/Conservator: None - per MD, the pt is capable of making decisions on her own behalf   Abuse/Neglect Physical Abuse: Denies Verbal Abuse: Denies Sexual Abuse: Denies Exploitation of patient/patient's resources: Denies Self-Neglect: Denies  Emotional Status Pt's affect, behavior adn adjustment status: Pt lying in bed and reports fatigued from full morning of therapies.  Soft voice and flat affect for most of interview.  She does become a little tearful when expressing frustrations with her physical and speech limitations.  She denies any significant emotional distress and has goals to be able to return to the traveling that she and her husband enjoy.  (they have another home in [place], [place])  Will monitor and refer for neuropsychology consult if indicated. Recent Psychosocial  Issues: None Pyschiatric History: None Substance Abuse History: None  Patient / Family Perceptions, Expectations & Goals Pt/Family understanding of illness & functional limitations: Pt and husband have a good understanding of her stroke and resulting limitations/ need for CIR.   She compares the current deficits to  the minimal deficits she had following her CVA in 2014. Premorbid pt/family roles/activities: Pt was very independent and active PTA.  She and husband spend every other week at their home in [place] Anticipated changes in roles/activities/participation: Husband will need to provide some support, however, goals set for supervision  [employer]: None Premorbid Home Care/DME Agencies: None Transportation available at discharge: yes  Discharge Planning Living Arrangements: Spouse/significant other Support Systems: Spouse/significant other, Children, Friends/neighbors Type of Residence: Private residence Insurance Resources:  ([place]) Financial Resources: [place] Referred: No Living Expenses: Own Money Management: Spouse Does the patient have any problems obtaining your medications?: No Home Management: pt and spouse share Patient/Family Preliminary Plans: pt to return home with spouse as primary support Social Work Anticipated Follow Up Needs: HH/OP  Clinical Impression Unfortunate, elderly woman here following a CVA.  PTA very active and independent with her husband at home.  Husband able to provide 24/7 assistance.  Pt a little tearful due to frustrations with current functional limitations.  Husband very encouraging.  Pt denies any significant emotional distress. Will follow for support and d/c planning needs.  Ostin Mathey 12/18/2015, 4:36 PM

## 2015-12-18 NOTE — Care Management Note (Signed)
Inpatient Rehabilitation Center Individual Statement of Services  Patient Name:  Stacey Ramirez  Date:  12/18/2015  Welcome to the [facility].  Our goal is to provide you with an individualized program based on your diagnosis and situation, designed to meet your specific needs.  With this comprehensive rehabilitation program, you will be expected to participate in at least 3 hours of rehabilitation therapies Monday-Friday, with modified therapy programming on the weekends.  Your rehabilitation program will include the following services:  Physical Therapy (PT), Occupational Therapy (OT), Speech Therapy (ST), 24 hour per day rehabilitation nursing, Therapeutic Recreaction (TR), Neuropsychology, Case Management (Social Worker), Rehabilitation Medicine, Nutrition Services and Pharmacy Services  Weekly team conferences will be held on Tuesdays to discuss your progress.  Your Social Worker will talk with you frequently to get your input and to update you on team discussions.  Team conferences with you and your family in attendance may also be held.  Expected length of stay: 7-10 days  Overall anticipated outcome: supervision  Depending on your progress and recovery, your program may change. Your Social Worker will coordinate services and will keep you informed of any changes. Your Social Worker's name and contact numbers are listed  below.  The following services may also be recommended but are not provided by the [facility] will be made to provide these services after discharge if needed.  Arrangements include referral to agencies that provide these services.  Your insurance has been verified to be:  [employer] Your primary doctor is:  Dr. Shelia Media  Pertinent information will be shared with your doctor and your insurance  company.  Social Worker:  [place], [place] or ([phone]   Information discussed with and copy given to patient by: Lennart Pall, 12/18/2015, 4:39 PM

## 2015-12-18 NOTE — Progress Notes (Signed)
[facility] PHYSICAL MEDICINE & REHABILITATION     PROGRESS NOTE    Subjective/Complaints: Had a pretty good night. Proud of herself that she can feed with help of her left hand. Ready for therapies today.  ROS: Pt denies fever, rash/itching, headache, blurred or double vision, nausea, vomiting, abdominal pain, diarrhea, chest pain, shortness of breath, palpitations, dysuria, dizziness, neck or back pain, bleeding, anxiety, or depression   Objective: Vital Signs: Blood pressure 148/80, pulse 68, temperature 97.8 F (36.6 C), temperature source Oral, resp. rate 18, SpO2 98 %. No results found.  Recent Labs  12/17/15 0219 12/18/15 0428  WBC 5.8 6.7  HGB 13.6 13.7  HCT 41.0 41.8  PLT 219 226    Recent Labs  12/17/15 0219 12/18/15 0428  NA 141 142  K 4.0 4.2  CL 104 105  GLUCOSE 105* 101*  BUN 10 12  CREATININE 0.79 0.66  CALCIUM 9.6 9.3   CBG (last 3)  No results for input(s): GLUCAP in the last 72 hours.  Wt Readings from Last 3 Encounters:  12/17/15 52.164 kg (115 lb)  04/10/15 52.98 kg (116 lb 12.8 oz)  10/04/14 53.252 kg (117 lb 6.4 oz)    Physical Exam:  Head: Normocephalic and atraumatic.  Right Ear: External ear normal.  Left Ear: External ear normal.  Eyes: Conjunctivae are normal. Pupils are equal, round, and reactive to light.  Neck: Normal range of motion. Neck supple. No tracheal deviation present. No thyromegaly present.  Cardiovascular: Normal rate and regular rhythm. Exam reveals no friction rub.  Murmur heard. Respiratory: Effort normal and breath sounds normal. No stridor. No respiratory distress. She has no wheezes. She has no rales.  GI: Bowel sounds are normal. She exhibits no distension. There is no tenderness.  Musculoskeletal: She exhibits no edema or tenderness.  Neurological: She is alert and oriented to person, place, and time.  Mild left facial weakness. Soft voice but no dysarthria. Able to follow one and two step commands  without difficulty. Reasonable insight and awareness. Motor:  Right upper extremity: 5/5 proximal distal  RLE: 5/5HF,KE and 4 to 4+ ADF/PF Left upper; 4/5 deltoid, bicep, tricep, wrist, hand Left lower extremity and 3+HF, 4-KE 4 ADF/PF DTRs symmetric. No sensory deficits Sensation intact light touch  Skin: Skin is warm and dry. No rash noted. No erythema.  Psychiatric: Thought content normal. Her mood appears slightly anxious.    Assessment/Plan: 1. Gait and functional deficits secondary to right basal ganglia infarct which require 3+ hours per day of interdisciplinary therapy in a comprehensive inpatient rehab setting. Physiatrist is providing close team supervision and 24 hour management of active medical problems listed below. Physiatrist and rehab team continue to assess barriers to discharge/monitor patient progress toward functional and medical goals.  Function:  Bathing Bathing position      Bathing parts      Bathing assist        Upper Body Dressing/Undressing Upper body dressing                    Upper body assist        Lower Body Dressing/Undressing Lower body dressing                                  Lower body assist        Toileting Toileting          Toileting assist  Transfers Chair/bed Physiological scientist Comprehension Comprehension assist level: Follows basic conversation/direction with no assist  Expression Expression assist level: Expresses basic needs/ideas: With no assist  Social Interaction Social Interaction assist level: Interacts appropriately with others - No medications needed.  Problem Solving Problem solving assist level: Solves basic 90% of the time/requires cueing < 10% of the time  Memory Memory assist level: More than reasonable amount of time   Medical Problem List and Plan: 1. Gait and functional deficits secondary to  right basal ganglia infarct  -begin CIR therapies 2. DVT Prophylaxis/Anticoagulation: Pharmaceutical: Lovenox 3. Pain Management:tylenol prn 4. Mood: LCSW to follow for evaluation and support. Xanax prn for anxiety.   -team to provide ego support 5. Neuropsych: This patient is capable of making decisions on her own behalf. 6. Skin/Wound Care: Routine pressure relief measures.  7. Fluids/Electrolytes/Nutrition: I personally reviewed the patient's labs today.   -encourage po  8. Dyslipidemia: On Crestor 20 mg daily -- alternate Praluent if unable to tolerate statin 9. Hypothyroid: On supplement.  10. Aortic stenosis/MR with DOE: Continue Metoprolol bid.  11. Constipation: On Miralax 12. HTN: monitor BP tid--Norvasc and Diovan on hold at this time to allow for adequate perfusion.    -bp's appear to be normalizing---resume diovan as needed    LOS (Days) 1 A FACE TO FACE EVALUATION WAS PERFORMED  Akhila Mahnken T 12/18/2015 8:57 AM

## 2015-12-19 ENCOUNTER — Inpatient Hospital Stay (HOSPITAL_COMMUNITY): Payer: PPO | Admitting: Speech Pathology

## 2015-12-19 ENCOUNTER — Inpatient Hospital Stay (HOSPITAL_COMMUNITY): Payer: PPO | Admitting: Physical Therapy

## 2015-12-19 ENCOUNTER — Inpatient Hospital Stay (HOSPITAL_COMMUNITY): Payer: PPO | Admitting: Occupational Therapy

## 2015-12-19 DIAGNOSIS — F4322 Adjustment disorder with anxiety: Secondary | ICD-10-CM

## 2015-12-19 DIAGNOSIS — G8194 Hemiplegia, unspecified affecting left nondominant side: Secondary | ICD-10-CM | POA: Diagnosis not present

## 2015-12-19 DIAGNOSIS — I63411 Cerebral infarction due to embolism of right middle cerebral artery: Secondary | ICD-10-CM | POA: Diagnosis not present

## 2015-12-19 MED ORDER — ALPRAZOLAM 0.25 MG PO TABS
0.1250 mg | ORAL_TABLET | Freq: Two times a day (BID) | ORAL | Status: DC
  Administered 2015-12-19 – 2015-12-22 (×6): 0.125 mg via ORAL
  Filled 2015-12-19 (×9): qty 1

## 2015-12-19 NOTE — Progress Notes (Signed)
Occupational Therapy Session Note  Patient Details  Name: Stacey Ramirez MRN: [id number] Date of Birth: 08-Jun-1941  Today's Date: 12/19/2015 OT Individual Time: [id number] OT Individual Time Calculation (min): 65 min    Short Term Goals: Week 1:  OT Short Term Goal 1 (Week 1): Pt will maintain midline during standing while performing functional task with min A with min cues. OT Short Term Goal 2 (Week 1): Pt will demonstrate selective attention during ADL tasks with min cuing OT Short Term Goal 3 (Week 1): Pt will perform toileting with close supervision with VC OT Short Term Goal 4 (Week 1): Pt will perform a basic transfer with close supervision bed to chair  Skilled Therapeutic Interventions/Progress Updates:    1:1 Since pt participated in bathing and dressing yesterday afternoon and not having any more clothing here pt defered bathing and dressing until tomorrow. Engaged in functional activities to address dynamic standing balance, sit to stands, weight shifts to the left, trunk/ core strengthening, visual attention to all fields, sustained to selective attention to alternating. Pt able to demonstrate functional ambulation with increased speed today and with decr lean to the left. Participated in activity with Dynavision to focus on different levels of attention and visual scanning. Pt at first with decr attention to bottom left quadrant but improved with repetition. Pt very verbal during tasks; often distracting herself making tasks more difficulty with alternating attention (when incorporating the T-scope). In gym heavy work to work on weight shifts to and from the left on tilted block and on Kinetron with min to mod support and VC to sustain full weight shift on and off of left side. Pt return to room with mod cues for directions.   Therapy Documentation Precautions:  Precautions Precautions: Fall Precaution Comments: leans to the left Restrictions Weight Bearing Restrictions:  No General: General OT Amount of Missed Time: 10 Minutes due to anxiety and fatigue Vital Signs: Therapy Vitals BP: (!) 152/70 mmHg (after exercise) Patient Position (if appropriate): standing Pain:  no c/o pain just anxiety and feelings of being "confined/ suffocating" Rn came and gave her meds and she reported neuropsych was going to come see her ADL: ADL ADL Comments: see functional navigator  See Function Navigator for Current Functional Status.   Therapy/Group: Individual Therapy  Willeen Cass [facility] 12/19/2015, 10:50 AM

## 2015-12-19 NOTE — Progress Notes (Signed)
Speech Language Pathology Daily Session Note  Patient Details  Name: Stacey Ramirez MRN: [id number] Date of Birth: [birth year]  Today's Date: 12/19/2015 SLP Individual Time: 1500-1530 SLP Individual Time Calculation (min): 30 min  Short Term Goals: Week 1: SLP Short Term Goal 1 (Week 1): Pt complete medication management tasks with min A. SLP Short Term Goal 2 (Week 1): Pt complete calendar management tasks with min A. SLP Short Term Goal 3 (Week 1): Pt demonstrate functional sequencing of a multi-step process with min A. SLP Short Term Goal 4 (Week 1): Pt demonstrate self-monitoring/correction to improve speech clarity with compensatory strategies with min A at the conversational level.  SLP Short Term Goal 5 (Week 1): Pt tolerate regular diet consistency at mod I level for management of oral dysphagia. SLP Short Term Goal 6 (Week 1): Pt demonstrate sustained attention to task for 10 minutes with min verbal cueing.  Skilled Therapeutic Interventions: Skilled treatment session focused on cognitive goals. SLP facilitated session by providing Min A verbal cues for problem solving with a basic money management task and for recall of her current medications and their functions. Patient verbose throughout the session and often repeated information. Patient was able to sustain attention to a functional task for ~5 minutes with Min A verbal cues for redirection. Patient left upright in wheelchair with family present. Continue with current plan of care.    Function:  Cognition Comprehension Comprehension assist level: Follows complex conversation/direction with extra time/assistive device  Expression   Expression assist level: Expresses basic 75 - 89% of the time/requires cueing 10 - 24% of the time. Needs helper to occlude trach/needs to repeat words.  Social Interaction Social Interaction assist level: Interacts appropriately 75 - 89% of the time - Needs redirection for appropriate language or to  initiate interaction.  Problem Solving Problem solving assist level: Solves basic 75 - 89% of the time/requires cueing 10 - 24% of the time  Memory Memory assist level: Recognizes or recalls 75 - 89% of the time/requires cueing 10 - 24% of the time    Pain Pain Assessment Pain Assessment: No/denies pain  Therapy/Group: Individual Therapy  Safi Culotta, [place] 12/19/2015, 5:25 PM

## 2015-12-19 NOTE — Progress Notes (Signed)
Occupational Therapy Session Note  Patient Details  Name: Stacey Ramirez MRN: [id number] Date of Birth: Dec 26, 1940  Today's Date: 12/19/2015 OT Individual Time: 1430-1500 OT Individual Time Calculation (min): 30 min    Short Term Goals: Week 1:  OT Short Term Goal 1 (Week 1): Pt will maintain midline during standing while performing functional task with min A with min cues. OT Short Term Goal 2 (Week 1): Pt will demonstrate selective attention during ADL tasks with min cuing OT Short Term Goal 3 (Week 1): Pt will perform toileting with close supervision with VC OT Short Term Goal 4 (Week 1): Pt will perform a basic transfer with close supervision bed to chair  Skilled Therapeutic Interventions/Progress Updates:    Pt received in bed after a short nap. Pt stood from EOB with steadying A as she had posterior lean over her heels. Pt aware of this stating she knows this is one of her main problems. Pt completed stand pivot to w/c and was transported to gym. From edge of mat, pt worked on dynamic reaching to L to elongate L trunk with ball rolls to the L. Bilateral ball holds for overhead reaching. Pt initially with L lean and posterior lean, pt was able to self correct with cues and visual feedback.  Sit to stand holding ball to encourage forward lean 12x. Pt worked on static standing with overhead arm reaches and no posterior lean.  Heel raises with steadying A 12x. Pt ambulated behind her w/c and she pushed it to the speech therapy room.  Pt with her speech therapist for the next session.    Therapy Documentation Precautions:  Precautions Precautions: Fall Precaution Comments: leans to the left Restrictions Weight Bearing Restrictions: No  Pain: Pain Assessment Pain Assessment: No/denies pain ADL: ADL ADL Comments: see functional navigator  See Function Navigator for Current Functional Status.   Therapy/Group: Individual Therapy  [place] 12/19/2015, 3:21 PM

## 2015-12-19 NOTE — Progress Notes (Signed)
Physical Therapy Session Note  Patient Details  Name: Stacey Ramirez MRN: [id number] Date of Birth: Apr 03, 1941  Today's Date: 12/19/2015 PT Individual Time: [id number] PT Individual Time Calculation (min): 60 min   Short Term Goals: Week 1:  PT Short Term Goal 1 (Week 1): = LTGs due to anticipated LOS  Skilled Therapeutic Interventions/Progress Updates:   Patient received in wheelchair with daughter Stacey Ramirez present for session. Patient ambulated to/from bathroom and to sink for hand hygiene with min A.   Gait training x 200 ft with L HHA with verbal cues for increased L step length and forward gaze with min A overall.   Stair training up/down 12 (6") stairs using 2 rails with self-elected reciprocal pattern with supervision and max verbal cues for advancing LUE along rail.   Outcome Measures: 10 MWT with L HHA = 0.59 m/s 5TSS from arm chair = 25 sec with focus on anterior weight shift and upright posture at top of stand  Patient demonstrates high fall risk as noted by score of  33/56 on Berg Balance Scale, see details below.    Patient instructed in floor transfer using mat table for UE support with demonstration and verbal cues for technique and mod A overall for L NMR and forced use.   Patient ambulated back to room with no device and close supervision with max verbal cues for L step length, safe speed, and upright posture/forward gaze with one seated rest break. Patient transferred sit > supine in bed with supervision and instructed in using rails in hooklying position to reposition self higher in bed. Patient left semi reclined in bed with daughter present.    Therapy Documentation Precautions:  Precautions Precautions: Fall Precaution Comments: leans to the left Restrictions Weight Bearing Restrictions: No Vital Signs: Therapy Vitals Temp: 97.5 F (36.4 C) Temp Source: Oral Pulse Rate: 79 Resp: 17 BP: (!) 146/75 mmHg Patient Position (if appropriate): Sitting Oxygen  Therapy SpO2: 100 % O2 Device: Not Delivered Pain: Pain Assessment Pain Assessment: No/denies pain Balance: Standardized Balance Assessment Standardized Balance Assessment: Berg Balance Test Berg Balance Test Sit to Stand: Able to stand without using hands and stabilize independently Standing Unsupported: Able to stand 2 minutes with supervision Sitting with Back Unsupported but Feet Supported on Floor or Stool: Able to sit safely and securely 2 minutes Stand to Sit: Sits safely with minimal use of hands Transfers: Able to transfer with verbal cueing and /or supervision Standing Unsupported with Eyes Closed: Able to stand 10 seconds with supervision Standing Ubsupported with Feet Together: Able to place feet together independently and stand for 1 minute with supervision From Standing, Reach Forward with Outstretched Arm: Reaches forward but needs supervision From Standing Position, Pick up Object from Floor: Able to pick up shoe, needs supervision From Standing Position, Turn to Look Behind Over each Shoulder: Turn sideways only but maintains balance Turn 360 Degrees: Needs close supervision or verbal cueing Standing Unsupported, Alternately Place Feet on Step/Stool: Able to complete >2 steps/needs minimal assist Standing Unsupported, One Foot in Front: Loses balance while stepping or standing Standing on One Leg: Able to lift leg independently and hold equal to or more than 3 seconds Total Score: 33/56   See Function Navigator for Current Functional Status.   Therapy/Group: Individual Therapy  Laretta Alstrom 12/19/2015, 4:46 PM

## 2015-12-19 NOTE — Progress Notes (Signed)
[facility] PHYSICAL MEDICINE & REHABILITATION     PROGRESS NOTE    Subjective/Complaints: Had a pretty good night. Proud of herself that she can feed with help of her left hand. Ready for therapies today.  ROS: Pt denies fever, rash/itching, headache, blurred or double vision, nausea, vomiting, abdominal pain, diarrhea, chest pain, shortness of breath, palpitations, dysuria, dizziness, neck or back pain, bleeding, anxiety, or depression   Objective: Vital Signs: Blood pressure 137/63, pulse 70, temperature 98.3 F (36.8 C), temperature source Oral, resp. rate 18, SpO2 98 %. No results found.  Recent Labs  12/17/15 0219 12/18/15 0428  WBC 5.8 6.7  HGB 13.6 13.7  HCT 41.0 41.8  PLT 219 226    Recent Labs  12/17/15 0219 12/18/15 0428  NA 141 142  K 4.0 4.2  CL 104 105  GLUCOSE 105* 101*  BUN 10 12  CREATININE 0.79 0.66  CALCIUM 9.6 9.3   CBG (last 3)  No results for input(s): GLUCAP in the last 72 hours.  Wt Readings from Last 3 Encounters:  12/17/15 52.164 kg (115 lb)  04/10/15 52.98 kg (116 lb 12.8 oz)  10/04/14 53.252 kg (117 lb 6.4 oz)    Physical Exam:  Head: Normocephalic and atraumatic.  Right Ear: External ear normal.  Left Ear: External ear normal.  Eyes: Conjunctivae are normal. Pupils are equal, round, and reactive to light.  Neck: Normal range of motion. Neck supple. No tracheal deviation present. No thyromegaly present.  Cardiovascular: Normal rate and regular rhythm. Exam reveals no friction rub.  Murmur heard. Respiratory: Effort normal and breath sounds normal. No stridor. No respiratory distress. She has no wheezes. She has no rales.  GI: Bowel sounds are normal. She exhibits no distension. There is no tenderness.  Musculoskeletal: She exhibits no edema or tenderness.  Neurological: She is alert and oriented to person, place, and time.  Mild left facial weakness. Soft voice but no dysarthria. Able to follow one and two step commands  without difficulty. Reasonable insight and awareness. Motor:  Right upper extremity: 5/5 proximal distal  RLE: 5/5HF,KE and 4 to 4+ ADF/PF Left upper; 4/5 deltoid, bicep, tricep, wrist, hand Left lower extremity and 3+HF, 4-KE 4 ADF/PF DTRs symmetric. No sensory deficits Sensation intact light touch  Skin: Skin is warm and dry. No rash noted. No erythema.  Psychiatric: Thought content normal. Her mood appears slightly anxious.    Assessment/Plan: 1. Gait and functional deficits secondary to right basal ganglia infarct which require 3+ hours per day of interdisciplinary therapy in a comprehensive inpatient rehab setting. Physiatrist is providing close team supervision and 24 hour management of active medical problems listed below. Physiatrist and rehab team continue to assess barriers to discharge/monitor patient progress toward functional and medical goals.  Function:  Bathing Bathing position   Position: Shower  Bathing parts Body parts bathed by patient: Right arm, Left arm, Chest, Abdomen, Front perineal area, Buttocks, Right upper leg, Left upper leg Body parts bathed by helper: Right lower leg, Left lower leg, Back  Bathing assist Assist Level: Touching or steadying assistance(Pt > 75%)      Upper Body Dressing/Undressing Upper body dressing   What is the patient wearing?: Bra, Pull over shirt/dress Bra - Perfomed by patient: Thread/unthread right bra strap, Thread/unthread left bra strap Bra - Perfomed by helper: Hook/unhook bra (pull down sports bra) Pull over shirt/dress - Perfomed by patient: Thread/unthread right sleeve, Thread/unthread left sleeve, Put head through opening Pull over shirt/dress - Perfomed by  helper: Pull shirt over trunk        Upper body assist Assist Level: Touching or steadying assistance(Pt > 75%)      Lower Body Dressing/Undressing Lower body dressing   What is the patient wearing?: Underwear, Pants, Non-skid slipper socks Underwear -  Performed by patient: Thread/unthread right underwear leg, Thread/unthread left underwear leg Underwear - Performed by helper: Pull underwear up/down Pants- Performed by patient: Thread/unthread right pants leg, Thread/unthread left pants leg, Pull pants up/down, Fasten/unfasten pants   Non-skid slipper socks- Performed by patient: Don/doff right sock, Don/doff left sock                    Lower body assist Assist for lower body dressing: Touching or steadying assistance (Pt > 75%)      Toileting Toileting   Toileting steps completed by patient: Adjust clothing prior to toileting, Performs perineal hygiene (2/2 steps - removed clothing for shower) Toileting steps completed by helper: Adjust clothing after toileting    Toileting assist Assist level: Touching or steadying assistance (Pt.75%)   Transfers Chair/bed transfer   Chair/bed transfer method: Ambulatory, Stand pivot Chair/bed transfer assist level: Touching or steadying assistance (Pt > 75%) Chair/bed transfer assistive device: Armrests     Locomotion Ambulation     Max distance: 200 ft Assist level: Touching or steadying assistance (Pt > 75%)   Wheelchair          Cognition Comprehension Comprehension assist level: Follows basic conversation/direction with extra time/assistive device  Expression Expression assist level: Expresses complex 90% of the time/cues < 10% of the time  Social Interaction Social Interaction assist level: Interacts appropriately 90% of the time - Needs monitoring or encouragement for participation or interaction.  Problem Solving Problem solving assist level: Solves basic 50 - 74% of the time/requires cueing 25 - 49% of the time  Memory Memory assist level: Recognizes or recalls 50 - 74% of the time/requires cueing 25 - 49% of the time   Medical Problem List and Plan: 1. Gait and functional deficits secondary to right basal ganglia infarct  -continue CIR therapies 2. DVT  Prophylaxis/Anticoagulation: Pharmaceutical: Lovenox 3. Pain Management:tylenol prn 4. Mood/anxiety: patient has substantial anxiety especially with the sense of "losing control" after the stroke  -schedule LOW dose xanax  -will ask neuropsych to see patient  -team to provide ego supportand positive reinforcement as well--spoke with patient and husband at length today 5. Neuropsych: This patient is capable of making decisions on her own behalf. 6. Skin/Wound Care: Routine pressure relief measures.  7. Fluids/Electrolytes/Nutrition: I personally reviewed the patient's labs today.   -encourage po  8. Dyslipidemia: On Crestor 20 mg daily -- alternate Praluent if unable to tolerate statin 9. Hypothyroid: On supplement.  10. Aortic stenosis/MR with DOE: Continue Metoprolol bid.  11. Constipation: On Miralax 12. HTN: monitor BP tid--Norvasc and Diovan on hold at this time to allow for adequate perfusion.    -bp's improving---resume diovan as needed    LOS (Days) 2 A FACE TO FACE EVALUATION WAS PERFORMED  Cyntia Staley T 12/19/2015 9:02 AM

## 2015-12-19 NOTE — Discharge Summary (Addendum)
[facility] Discharge Summary   Patient: Stacey Ramirez [id number]   PCP: Horatio Pel, MD DOB: 06/09/1941   Date of admission: 12/13/2015   Date of discharge: 12/17/2015     Discharge Diagnoses:  Principal Problem:   Right Basal ganglia infarction [facility]) Active Problems:   Hypertension   Hypercholesterolemia   Hypothyroidism   LVH (left ventricular hypertrophy)   Aortic stenosis   SVT (supraventricular tachycardia) (HCC)   Cerebrovascular accident (CVA) due to occlusion of cerebral artery ([place])   History of CVA with residual deficit   Prediabetes   Recommendations for Outpatient Follow-up:  1. Please follow up with PCP in 1 week  2. Please follow up with neurology   Follow-up Information    Follow up with [facility] On 01/01/2016.   Specialty:  Cardiology   Why:  2:00PM, wound check   Contact information:   [address] [place] [phone]      Follow up with Xu,Jindong, MD.   Specialty:  Neurology   Contact information:   [address] [place] [address] [phone]      Diet recommendation: cardiac diet  Activity: The patient is advised to gradually reintroduce usual activities.  Discharge Condition: stable  History of present illness: As per the H and P dictated on admission, "Stacey Ramirez is a 75 y.o. female with a past medical history that includes CVA, hypothyroidism, hypercholesterolemia, hypertension presents to emergency department with a chief complaint of stroke like symptoms. Initial evaluation concerning for stroke.  Information is obtained from the patient and the husband who is at the bedside. Patient complains of generalized weakness, facial droop and difficulty walking. During the night she needed assistance ambulating to the bathroom which is not her norm. This morning she awakened slight headache and 3 episodes of emesis. She denies any coffee ground  emesis or bright red emesis. Addition she experienced some urinary frequency over the last several days. Otherwise she denies headache visual disturbances numbness tingling of extremities. She denies fever chills cough chest pain shortness of breath. Patient reports symptoms began yesterday before going to sleep.  In the emergency department she is afebrile hemodynamically stable and not hypoxic. Was given the bedside swallow eval which she passed."  Hospital Course:  Summary of her active problems in the hospital is as following.  1. Basal ganglia infarction [facility]) Patient presents with left-sided weakness as well as slurred speech. TPA was not given since outside of the window period. MRA shows moderate stenosis of the right PCA, right supraclinoid ICA Echocardiogram shows 60-65% EF Carotid unremarkable. LDL 238-patient has not been able to tolerate statins in the past. May be a candidate for PCS K9 Hemoglobin A1c 5.7. Patient was an 81 mg aspirin will changed to 75 mg Plavix. Aspirin and plavix for 3 months and then plavix only. TEE on 12/17/2015, unremarkable, loop recorder inserted  2. Essential hypertension. Permissive hypertension. Stopped HCTZ. Added lopressor.  3. Recurrent SVT. Telemetry shows the patient has recurrent SVT. We'll start low-dose Lopressor. Maintain K more than 4 magnesium more than 2.  4. Hypothyroidism. Continue Synthroid.   All other chronic medical condition were stable during the hospitalization.  Patient was seen by physical therapy, who recommended CIR, which was arranged by Education officer, museum and case Freight forwarder. On the day of the discharge the patient's vitals were stable, and no other acute medical condition were reported by patient. the patient was felt safe to  be discharge at [facility] with therapy.  Procedures and Results:  Echocardiogram  Study Conclusions  - Left ventricle: The cavity size was normal. Wall thickness was  increased in a pattern of  mild LVH. Systolic function was normal.  The estimated ejection fraction was in the range of 60% to 65%.  Wall motion was normal; there were no regional wall motion  abnormalities. Doppler parameters are consistent with abnormal  left ventricular relaxation (grade 1 diastolic dysfunction). - Aortic valve: There was no stenosis. - Mitral valve: There was no significant regurgitation. - Right ventricle: The cavity size was normal. Systolic function  was normal. - Pulmonary arteries: No complete TR doppler jet so unable to  estimate PA systolic pressure. - Inferior vena cava: The vessel was normal in size. The  respirophasic diameter changes were in the normal range (>= 50%),  consistent with normal central venous pressure.  Impressions:  - Normal LV size with mild LV hypertrophy. EF 60-65%. Normal RV  size and systolic function. No significant valvular  abnormalities.   TEE Impressions:  - Normal LV function; mild MR and TR; severe atherosclerosis  descending aorta; negative saline microcavitation study.   Carotid doppler Summary:  - The vertebral arteries appear patent with antegrade flow. - Findings consistent with 1-39 percent stenosis involving the  right internal carotid artery and the left internal carotid  artery. - Elevated velocities in the external carotid arteries bilaterally. - Elevated velocities in the left subclavian artery. suggest  stenosis   Venous doppler Summary:  - No evidence of deep vein thrombosis involving the right lower  extremity and left lower extremity. - No evidence of Baker&'s cyst on the right or left.   Loop recorder insertion    Consultations:  Cardiology  Neurology   DISCHARGE MEDICATION: Discharge Medication List as of 12/17/2015  4:57 PM    START taking these medications   Details  aspirin EC 325 MG EC tablet Take 1 tablet (325 mg total) by mouth daily., Starting 12/17/2015, Until Discontinued,  Normal    clopidogrel (PLAVIX) 75 MG tablet Take 1 tablet (75 mg total) by mouth daily., Starting 12/17/2015, Until Discontinued, Normal    metoprolol tartrate (LOPRESSOR) 25 MG tablet Take 0.5 tablets (12.5 mg total) by mouth 2 (two) times daily., Starting 12/17/2015, Until Discontinued, Normal    polyethylene glycol (MIRALAX / GLYCOLAX) packet Take 17 g by mouth daily., Starting 12/17/2015, Until Discontinued, Normal      CONTINUE these medications which have NOT CHANGED   Details  amLODipine (NORVASC) 2.5 MG tablet Take 2.5 mg by mouth every evening. , Starting 10/04/2013, Until Discontinued, Historical Med    levothyroxine (SYNTHROID, LEVOTHROID) 25 MCG tablet Take 25 mcg by mouth daily before breakfast. , Starting 09/21/2014, Until Discontinued, Historical Med    LOTEMAX 0.5 % ophthalmic suspension Place 1 drop into both eyes 4 (four) times daily. , Starting 02/17/2013, Until Discontinued, Historical Med    rosuvastatin (CRESTOR) 10 MG tablet Take 10 mg by mouth as directed. 1/4 tablet every other day, Until Discontinued, Historical Med    valsartan (DIOVAN) 320 MG tablet Take 320 mg by mouth daily. , Starting 09/26/2014, Until Discontinued, Historical Med    ALPRAZolam (XANAX) 0.25 MG tablet Take 1 tablet (0.25 mg total) by mouth daily as needed for anxiety (anxiety)., Starting 05/03/2015, Until Discontinued, Phone In    diltiazem 2 % GEL Apply 1 application topically 2 (two) times daily., Starting 12/15/2011, Until Discontinued, Normal    estradiol (ESTRACE) 0.1 MG/GM  vaginal cream Place 2 g vaginally as needed (dryness). , Until Discontinued, Historical Med    trimethoprim (TRIMPEX) 100 MG tablet Take 100 mg by mouth daily as needed (infection). , Starting 06/02/2011, Until Discontinued, Historical Med      STOP taking these medications     aspirin 81 MG tablet      hydrochlorothiazide (MICROZIDE) 12.5 MG capsule        Allergies  Allergen Reactions  . Sulfa Drugs Cross Reactors  Itching  . Zocor [Simvastatin] Other (See Comments)    Feels like she is going to pass out, weakness  . Micardis [Telmisartan] Other (See Comments)    Feels like she is going to pass out   Discharge Instructions    Diet - low sodium heart healthy    Complete by:  As directed      Discharge instructions    Complete by:  As directed   It is important that you read following instructions as well as go over your medication list with RN to help you understand your care after this hospitalization.  Discharge Instructions: Please follow-up with PCP in one week  Please request your primary care physician to go over all Hospital Tests and Procedure/Radiological results at the follow up,  Please get all Hospital records sent to your PCP by signing hospital release before you go home.   Do not drive, operating heavy machinery, perform activities at heights, swimming or participation in water activities or provide baby sitting services; until you have been seen by Primary Care Physician or a Neurologist and advised to do so again. Do not take more than prescribed Pain, Sleep and Anxiety Medications. You were cared for by a hospitalist during your hospital stay. If you have any questions about your discharge medications or the care you received while you were in the hospital after you are discharged, you can call the unit and ask to speak with the hospitalist on call if the hospitalist that took care of you is not available.  Once you are discharged, your primary care physician will handle any further medical issues. Please note that NO REFILLS for any discharge medications will be authorized once you are discharged, as it is imperative that you return to your primary care physician (or establish a relationship with a primary care physician if you do not have one) for your aftercare needs so that they can reassess your need for medications and monitor your lab values. You Must read complete  instructions/literature along with all the possible adverse reactions/side effects for all the Medicines you take and that have been prescribed to you. Take any new Medicines after you have completely understood and accept all the possible adverse reactions/side effects. Wear Seat belts while driving.     Increase activity slowly    Complete by:  As directed           Discharge Exam: Filed Weights   12/13/15 0919 12/17/15 0822  Weight: 52.164 kg (115 lb) 52.164 kg (115 lb)   Filed Vitals:   12/17/15 0945 12/17/15 1027  BP: 184/83 151/60  Pulse: 65 69  Temp:  98.2 F (36.8 C)  Resp: 15 16   General: Appear in mild distress, no Rash; Oral Mucosa moist Cardiovascular: S1 and S2 Present, aortic systolic Murmur, no JVD Respiratory: Bilateral Air entry present and Clear to Auscultation, no Crackles, no wheezes Abdomen: Bowel Sound present, Soft and no tenderness Extremities: no Pedal edema, no calf tenderness Neurology: left weakness, dysarthria  The results of significant diagnostics from this hospitalization (including imaging, microbiology, ancillary and laboratory) are listed below for reference.    Significant Diagnostic Studies: Dg Chest 2 View  12/13/2015  CLINICAL DATA:  Dizziness and weakness.  Hypertension. EXAM: CHEST  2 VIEW COMPARISON:  None. FINDINGS: There is no edema or consolidation. Heart size and pulmonary vascularity are normal. There is atherosclerotic calcification in the aorta. No adenopathy. No bone lesions. IMPRESSION: No edema or consolidation. Electronically Signed   By: Lowella Grip III M.D.   On: 12/13/2015 10:43   Ct Head Wo Contrast  12/13/2015  CLINICAL DATA:  Onset of inability to walk this morning. Initial encounter. No known injury. EXAM: CT HEAD WITHOUT CONTRAST TECHNIQUE: Contiguous axial images were obtained from the base of the skull through the vertex without intravenous contrast. COMPARISON:  None. FINDINGS: There is cortical atrophy and  chronic microvascular ischemic change. No evidence of acute intracranial abnormality including hemorrhage, infarct, mass lesion, mass effect or abnormal extra-axial fluid collection is identified. No hydrocephalus or pneumocephalus. The calvarium is intact. Imaged paranasal sinuses and mastoid air cells are clear. IMPRESSION: No acute abnormality. Atrophy and chronic microvascular ischemic change. Electronically Signed   By: Inge Rise M.D.   On: 12/13/2015 10:41   Mr Brain Wo Contrast  12/13/2015  CLINICAL DATA:  75 year old female with slurred speech, difficulty walking and facial droop symptom onset today. Initial encounter. EXAM: MRI HEAD WITHOUT CONTRAST MRA HEAD WITHOUT CONTRAST TECHNIQUE: Multiplanar, multiecho pulse sequences of the brain and surrounding structures were obtained without intravenous contrast. Angiographic images of the head were obtained using MRA technique without contrast. COMPARISON:  Head CT without contrast 0955 hours today. Head and neck MRA a 04/19/2013. FINDINGS: MRI HEAD FINDINGS Major intracranial vascular flow voids are preserved. Confluent 3.5 cm area of restricted diffusion in the right basal ganglia. Minimal to mild associated T2 and FLAIR hyperintensity. No associated hemorrhage. Mild regional mass effect, no significant intracranial mass effect. There is a superimposed punctate area of periventricular white matter restricted diffusion at the atrium on the right (series 8, image 11). No contralateral or posterior fossa restricted diffusion. Superimposed small chronic lacunar infarcts in both cerebellar hemispheres an the thalamus. Increased perivascular spaces throughout the basal ganglia. Patchy mostly posterior hemisphere nonspecific cerebral white matter T2 and FLAIR hyperintensity. Some of these white matter changes ppm most resemble chronic lacunar infarcts. No supratentorial cortical encephalomalacia identified. There are scattered chronic micro hemorrhages in  the temporal lobes and cerebellum. No midline shift, evidence of mass lesion, ventriculomegaly, extra-axial collection or acute intracranial hemorrhage. Cervicomedullary junction and pituitary are within normal limits. Negative visualized cervical spine. Mildly prominent internal auditory canals with otherwise negative visualized internal auditory structures. Mastoids are clear. Paranasal sinuses are clear. Negative orbit and scalp soft tissues. Visualized bone marrow signal is within normal limits. MRA HEAD FINDINGS Stable antegrade flow in the posterior circulation with fairly codominant distal vertebral arteries. Both PICA origins remain patent. Vertebrobasilar junction remains patent. Mild distal vertebral artery irregularity without stenosis appears stable. Mid basilar artery irregularity with up to mild stenosis has mildly progressed. SCA and PCA origins remain patent. There is mild to moderate stenosis in the right PCA P1 segment which is new. Bilateral PCA branches are stable. Posterior communicating arteries are diminutive or absent. Stable antegrade flow in both ICA siphons and distal cervical ICAs. Bilateral siphon atherosclerosis and irregularity. At least moderate stenosis just distal to the right ICA anterior genu appears stable (series 705, image 6).  Normal ophthalmic artery origins. Patent carotid termini. The right MCA origin and M1 segment remain patent. Mild M1 irregularity has not significantly changed. Right MCA bifurcation remains patent. Visualized right MCA branches appear stable with mild distal M2 and M3 branch irregularity. No right MCA branch occlusion identified. Bilateral ACA and left MCA branches are stable with mild irregularity. IMPRESSION: 1. Confluent acute infarct in the right basal ganglia with no associated hemorrhage or mass effect. Superimposed punctate posterior right MCA periventricular white matter infarct. 2. Negative for emergent large vessel occlusion. Anterior  circulation atherosclerosis appears stable since 2014, including at least moderate stenosis of the supraclinoid right ICA (series 705, image 6). 3. Mild progression of posterior circulation atherosclerosis since 2014 including new mild stenosis of the mid basilar artery and moderate stenosis of the right PCA P1 segment. 4. Underlying chronic small vessel ischemia, moderate for age. Electronically Signed   By: Genevie Ann M.D.   On: 12/13/2015 14:33   Mr Jodene Nam Head/brain Wo Cm  12/13/2015  CLINICAL DATA:  75 year old female with slurred speech, difficulty walking and facial droop symptom onset today. Initial encounter. EXAM: MRI HEAD WITHOUT CONTRAST MRA HEAD WITHOUT CONTRAST TECHNIQUE: Multiplanar, multiecho pulse sequences of the brain and surrounding structures were obtained without intravenous contrast. Angiographic images of the head were obtained using MRA technique without contrast. COMPARISON:  Head CT without contrast 0955 hours today. Head and neck MRA a 04/19/2013. FINDINGS: MRI HEAD FINDINGS Major intracranial vascular flow voids are preserved. Confluent 3.5 cm area of restricted diffusion in the right basal ganglia. Minimal to mild associated T2 and FLAIR hyperintensity. No associated hemorrhage. Mild regional mass effect, no significant intracranial mass effect. There is a superimposed punctate area of periventricular white matter restricted diffusion at the atrium on the right (series 8, image 11). No contralateral or posterior fossa restricted diffusion. Superimposed small chronic lacunar infarcts in both cerebellar hemispheres an the thalamus. Increased perivascular spaces throughout the basal ganglia. Patchy mostly posterior hemisphere nonspecific cerebral white matter T2 and FLAIR hyperintensity. Some of these white matter changes ppm most resemble chronic lacunar infarcts. No supratentorial cortical encephalomalacia identified. There are scattered chronic micro hemorrhages in the temporal lobes and  cerebellum. No midline shift, evidence of mass lesion, ventriculomegaly, extra-axial collection or acute intracranial hemorrhage. Cervicomedullary junction and pituitary are within normal limits. Negative visualized cervical spine. Mildly prominent internal auditory canals with otherwise negative visualized internal auditory structures. Mastoids are clear. Paranasal sinuses are clear. Negative orbit and scalp soft tissues. Visualized bone marrow signal is within normal limits. MRA HEAD FINDINGS Stable antegrade flow in the posterior circulation with fairly codominant distal vertebral arteries. Both PICA origins remain patent. Vertebrobasilar junction remains patent. Mild distal vertebral artery irregularity without stenosis appears stable. Mid basilar artery irregularity with up to mild stenosis has mildly progressed. SCA and PCA origins remain patent. There is mild to moderate stenosis in the right PCA P1 segment which is new. Bilateral PCA branches are stable. Posterior communicating arteries are diminutive or absent. Stable antegrade flow in both ICA siphons and distal cervical ICAs. Bilateral siphon atherosclerosis and irregularity. At least moderate stenosis just distal to the right ICA anterior genu appears stable (series 705, image 6). Normal ophthalmic artery origins. Patent carotid termini. The right MCA origin and M1 segment remain patent. Mild M1 irregularity has not significantly changed. Right MCA bifurcation remains patent. Visualized right MCA branches appear stable with mild distal M2 and M3 branch irregularity. No right MCA branch occlusion identified. Bilateral ACA and  left MCA branches are stable with mild irregularity. IMPRESSION: 1. Confluent acute infarct in the right basal ganglia with no associated hemorrhage or mass effect. Superimposed punctate posterior right MCA periventricular white matter infarct. 2. Negative for emergent large vessel occlusion. Anterior circulation atherosclerosis  appears stable since 2014, including at least moderate stenosis of the supraclinoid right ICA (series 705, image 6). 3. Mild progression of posterior circulation atherosclerosis since 2014 including new mild stenosis of the mid basilar artery and moderate stenosis of the right PCA P1 segment. 4. Underlying chronic small vessel ischemia, moderate for age. Electronically Signed   By: Genevie Ann M.D.   On: 12/13/2015 14:33    Microbiology: No results found for this or any previous visit (from the past 240 hour(s)).   Labs: CBC:  Recent Labs Lab 12/13/15 0927 12/13/15 0936 12/15/15 0545 12/17/15 0219  WBC 7.5  --  6.0 5.8  NEUTROABS 5.7  --  2.8  --   HGB 13.2 14.6 13.5 13.6  HCT 40.2 43.0 41.6 41.0  MCV 93.1  --  96.1 94.0  PLT 210  --  207 [id number]   Basic Metabolic Panel:  Recent Labs Lab 12/13/15 0927 12/13/15 0936 12/15/15 0545 12/17/15 0219  NA 141 141 143 141  K 3.7 3.6 3.5 4.0  CL 106 107 107 104  CO2 23  --  27 29  GLUCOSE 119* 113* 91 105*  BUN 13 15 12 10   CREATININE 0.63 0.50 0.66 0.79  CALCIUM 9.4  --  9.3 9.6  MG  --   --  2.2  --    Liver Function Tests:  Recent Labs Lab 12/13/15 0927 12/15/15 0545  AST 25 20  ALT 22 19  ALKPHOS 55 48  BILITOT 0.8 1.0  PROT 7.1 6.3*  ALBUMIN 4.1 3.5   Time spent: 30 minutes  Signed:  PATEL, PRANAV  [facility] 12/17/2015 , 12:13 AM

## 2015-12-20 ENCOUNTER — Inpatient Hospital Stay (HOSPITAL_COMMUNITY): Payer: PPO

## 2015-12-20 ENCOUNTER — Inpatient Hospital Stay (HOSPITAL_COMMUNITY): Payer: PPO | Admitting: Speech Pathology

## 2015-12-20 ENCOUNTER — Inpatient Hospital Stay (HOSPITAL_COMMUNITY): Payer: PPO | Admitting: Physical Therapy

## 2015-12-20 DIAGNOSIS — I63411 Cerebral infarction due to embolism of right middle cerebral artery: Secondary | ICD-10-CM | POA: Diagnosis not present

## 2015-12-20 DIAGNOSIS — F4322 Adjustment disorder with anxiety: Secondary | ICD-10-CM

## 2015-12-20 DIAGNOSIS — G8194 Hemiplegia, unspecified affecting left nondominant side: Secondary | ICD-10-CM | POA: Diagnosis not present

## 2015-12-20 HISTORY — DX: Adjustment disorder with anxiety: F43.22

## 2015-12-20 NOTE — Progress Notes (Signed)
Occupational Therapy Session Note  Patient Details  Name: Stacey Ramirez MRN: [id number] Date of Birth: 01/08/1941  Today's Date: 12/20/2015 OT Individual Time: 1100-1200 OT Individual Time Calculation (min): 60 min    Short Term Goals: Week 1:  OT Short Term Goal 1 (Week 1): Pt will maintain midline during standing while performing functional task with min A with min cues. OT Short Term Goal 2 (Week 1): Pt will demonstrate selective attention during ADL tasks with min cuing OT Short Term Goal 3 (Week 1): Pt will perform toileting with close supervision with VC OT Short Term Goal 4 (Week 1): Pt will perform a basic transfer with close supervision bed to chair  Skilled Therapeutic Interventions/Progress Updates:    Pt resting in w/c upon arrival with husband present.  Pt agreeable to engaging in BADL retraining including bathing and dressing with sit<>stand at sink.  Pt completed all bathing and dressing tasks with steady A while standing.  Pt required min verbal cue for sequencing when standing from w/c at sink.  Pt stood at sink to brush her teeth and apply makeup.  Pt's husband assisted pt to toilet with stand pivot transfer from w/c.  Pt's husband checked off to assist pt to toilet.  Pt and husband verbalized understanding that pt is only to transfer from w/c.  Pt is not to walk in the room except with therapy staff.  Pt requires more than a reasonable amount of time to complete all tasks.    Therapy Documentation Precautions:  Precautions Precautions: Fall Precaution Comments: leans to the left Restrictions Weight Bearing Restrictions: No  Pain:   Pt denied pain ADL: ADL ADL Comments: see functional navigator  See Function Navigator for Current Functional Status.   Therapy/Group: Individual Therapy  Leroy Libman 12/20/2015, 12:14 PM

## 2015-12-20 NOTE — IPOC Note (Signed)
Overall Plan of Care [facility]) Patient Details Name: Stacey Ramirez MRN: [id number] DOB: January 28, 1941  Admitting Diagnosis: CVA  Hospital Problems: Principal Problem:   Stroke due to embolism of right middle cerebral artery (HCC) Active Problems:   Left hemiparesis (HCC)   Adjustment reaction with anxiety     Functional Problem List: Nursing Endurance, Medication Management, Nutrition, Pain, Safety, Skin Integrity  PT Balance, Behavior, Endurance, Motor, Nutrition, Safety  OT Balance, Safety, Cognition, Skin Integrity, Vision, Edema, Endurance, Motor, Nutrition, Pain, Perception  SLP Cognition, Motor, Nutrition  TR         Basic ADL's: OT Grooming, Bathing, Dressing, Toileting     Advanced  ADL's: OT       Transfers: PT Bed Mobility, Bed to Chair, Car, Occupational psychologist, Research scientist (life sciences): PT Ambulation, Psychologist, prison and probation services, Stairs     Additional Impairments: OT Fuctional Use of Upper Extremity  SLP Swallowing, Social Cognition, Communication   Social Interaction, Problem Solving, Memory, Attention, Awareness  TR      Anticipated Outcomes Item Anticipated Outcome  Self Feeding n/a  Swallowing  mod I regular consistency   Basic self-care  supervision   Toileting  supervision   Bathroom Transfers supervision  Bowel/Bladder  Patient will be continent of bowel and bladder  Transfers  mod I  Locomotion  supervision  Communication  mod I complex  Cognition  min A complex  Pain  pain less than or equal to 4/10  Safety/Judgment  Patient will be free from falls/injury and display sound safety judgement   Therapy Plan: PT Intensity: Minimum of 1-2 x/day ,45 to 90 minutes PT Frequency: 5 out of 7 days PT Duration Estimated Length of Stay: 7-10 days OT Intensity: Minimum of 1-2 x/day, 45 to 90 minutes OT Frequency: 5 out of 7 days OT Duration/Estimated Length of Stay: 10-12 days SLP Intensity: Minumum of 1-2 x/day, 30 to 90 minutes SLP Frequency: 3  to 5 out of 7 days SLP Duration/Estimated Length of Stay: 7-10 days       Team Interventions: Nursing Interventions Patient/Family Education, Pain Management, Medication Management, Skin Care/Wound Management, Discharge Planning  PT interventions Ambulation/gait training, Balance/vestibular training, Cognitive remediation/compensation, Community reintegration, Discharge planning, Disease management/prevention, DME/adaptive equipment instruction, Functional mobility training, Functional electrical stimulation, Neuromuscular re-education, Pain management, Patient/family education, Psychosocial support, Stair training, Therapeutic Activities, Therapeutic Exercise, UE/LE Strength taining/ROM, UE/LE Coordination activities  OT Interventions Warden/ranger, Cognitive remediation/compensation, Community reintegration, Fish farm manager, Neuromuscular re-education, Psychosocial support, Splinting/orthotics, UE/LE Strength taining/ROM, Visual/perceptual remediation/compensation, Therapeutic Exercise, Skin care/wound managment, Patient/family education, Functional mobility training, Disease mangement/prevention, Discharge planning, Pain management, Self Care/advanced ADL retraining, Therapeutic Activities, UE/LE Coordination activities  SLP Interventions Cognitive remediation/compensation, Cueing hierarchy, Multimodal communication approach, Speech/Language facilitation, Functional tasks, Patient/family education, Medication managment, Dysphagia/aspiration precaution training  TR Interventions    SW/CM Interventions Discharge Planning, Psychosocial Support, Patient/Family Education    Team Discharge Planning: Destination: PT-Home ,OT- Home , SLP-Home Projected Follow-up: PT-Home health PT, OT-  Outpatient OT, SLP-Outpatient SLP Projected Equipment Needs: PT-To be determined, OT- To be determined, SLP-None recommended by SLP Equipment Details: PT- , OT-  Patient/family involved in  discharge planning: PT- Family member/caregiver, Patient,  OT-Patient, Family member/caregiver, SLP-Patient, Family member/caregiver  MD ELOS: 7-10 days Medical Rehab Prognosis:  Excellent Assessment: The patient has been admitted for CIR therapies with the diagnosis of left MCA infarct. The team will be addressing functional mobility, strength, stamina, balance, safety, adaptive techniques and equipment, self-care, bowel and bladder  mgt, patient and caregiver education, anxiety mgt, NMR, dexterity, community reintegration, cognition, commnication. Goals have been set at mod I to supervision for basic self-care and mobility and min assist to mod I with communication and cognition.    Meredith Staggers, MD, FAAPMR      See Team Conference Notes for weekly updates to the plan of care

## 2015-12-20 NOTE — Progress Notes (Signed)
Physical Therapy Session Note  Patient Details  Name: Stacey Ramirez MRN: [id number] Date of Birth: 12/16/1940  Today's Date: 12/20/2015 PT Individual Time: 0803-0900 PT Individual Time Calculation (min): 57 min   Short Term Goals: Week 1:  PT Short Term Goal 1 (Week 1): = LTGs due to anticipated LOS  Skilled Therapeutic Interventions/Progress Updates:    Pt received in bed, reporting wanting to eat breakfast, but denying c/o pain. Pt's husband present during initial portion of session then later excused himself. Pt able to transfer supine>sitting EOB with supervision & use of bed features, and supervision to transfer sit>stand. Pt wanting to have HHA from PT for sit>stand but educated pt on need to push up from seat. Pt able to thread pants on BLE sitting EOB & complete donning pants in standing with supervision A & no BUE support. Gait training x 5 ft bed>chair with steady A & decreased LLE step length. Pt with anxiety with ambulation, also noting SOB while in bed & feeling "squirrely", when asked for further explanation pt's husband notes she feels anxious. Pt's HR = 71 bpm, SpO2 = 100% on room air. While sitting pt ate breakfast with cuing from PT to utilize LUE for NMR during activity. Pt able to hold fork & move utensil from plate to mouth without assistance. Focused on pt grasping fork while cutting food & PT provided cuing for pt to use LUE instead of RUE. Pt very fixated on pt's family of cardiac issues & unable to redirect even with maximum cuing; educated pt on heart healthy diet & monitoring BP. Gait training room>BI gym with Min A to facilitate pelvic rotation & increased weight shift to R to allow step through LLE. Utilized dynavision for L NMR & pt with decreased reaction time to upper quadrants, and L slower than R. In apartment pt performed bed mobility (sit<>supine & Rolling L<>R) in regular bed with supervision A. Gait training 150 ft + ~250 ft to/from apartment with Min A to to help  correct increased lateral sway & loss of balance to L. Pt able to increase LLE step length with verbal cues but required frequent cuing to do so; pt with absent BUE swing during ambulation. At end of session pt left in room in chair with all needs within reach & husband present to supervise.   Therapy Documentation Precautions:  Precautions Precautions: Fall Precaution Comments: leans to the left Restrictions Weight Bearing Restrictions: No  Pain: Pain Assessment Pain Assessment: No/denies pain   See Function Navigator for Current Functional Status.   Therapy/Group: Individual Therapy  Waunita Schooner 12/20/2015, 8:09 AM

## 2015-12-20 NOTE — Progress Notes (Signed)
Speech Language Pathology Daily Session Note  Patient Details  Name: Stacey Ramirez MRN: [id number] Date of Birth: March 05, 1941  Today's Date: 12/20/2015 SLP Individual Time: 1515-1600 SLP Individual Time Calculation (min): 45 min  Short Term Goals: Week 1: SLP Short Term Goal 1 (Week 1): Pt complete medication management tasks with min A. SLP Short Term Goal 2 (Week 1): Pt complete calendar management tasks with min A. SLP Short Term Goal 3 (Week 1): Pt demonstrate functional sequencing of a multi-step process with min A. SLP Short Term Goal 4 (Week 1): Pt demonstrate self-monitoring/correction to improve speech clarity with compensatory strategies with min A at the conversational level.  SLP Short Term Goal 5 (Week 1): Pt tolerate regular diet consistency at mod I level for management of oral dysphagia. SLP Short Term Goal 6 (Week 1): Pt demonstrate sustained attention to task for 10 minutes with min verbal cueing.  Skilled Therapeutic Interventions: Skilled treatment session focused on cognition goals. SLP facilitated session by providing supervision cues for semi-complex calendar management task. SLP provided min A verbal cues for sustaining attention to tasks for 10 minutes. Pt demonstrated functional sequencing of multi-step process with supervision cues. Husband was present for session and pt was left in wheelchair with husband to transfer her back to bed when she desired. All needs left within reach. Continue current plan of care.   Function:  Cognition Comprehension Comprehension assist level: Follows complex conversation/direction with extra time/assistive device  Expression   Expression assist level: Expresses basic 75 - 89% of the time/requires cueing 10 - 24% of the time. Needs helper to occlude trach/needs to repeat words.  Social Interaction Social Interaction assist level: Interacts appropriately 75 - 89% of the time - Needs redirection for appropriate language or to initiate  interaction.  Problem Solving Problem solving assist level: Solves basic 75 - 89% of the time/requires cueing 10 - 24% of the time  Memory Memory assist level: Recognizes or recalls 75 - 89% of the time/requires cueing 10 - 24% of the time    Pain    Therapy/Group: Individual Therapy  Kyrsten Deleeuw 12/20/2015, 4:30 PM

## 2015-12-20 NOTE — Progress Notes (Signed)
[place] PHYSICAL MEDICINE & REHABILITATION     PROGRESS NOTE    Subjective/Complaints: Slept better last night. Still anxious about recovery, family hx of CV disease, etc  ROS: Pt denies fever, rash/itching, headache, blurred or double vision, nausea, vomiting, abdominal pain, diarrhea, chest pain, shortness of breath, palpitations, dysuria, dizziness, neck or back pain, bleeding, anxiety, or depression   Objective: Vital Signs: Blood pressure 102/56, pulse 64, temperature 98.1 F (36.7 C), temperature source Oral, resp. rate 18, SpO2 99 %. No results found.  Recent Labs  12/18/15 0428  WBC 6.7  HGB 13.7  HCT 41.8  PLT 226    Recent Labs  12/18/15 0428  NA 142  K 4.2  CL 105  GLUCOSE 101*  BUN 12  CREATININE 0.66  CALCIUM 9.3   CBG (last 3)  No results for input(s): GLUCAP in the last 72 hours.  Wt Readings from Last 3 Encounters:  12/17/15 52.164 kg (115 lb)  04/10/15 52.98 kg (116 lb 12.8 oz)  10/04/14 53.252 kg (117 lb 6.4 oz)    Physical Exam:  Head: Normocephalic and atraumatic.  Right Ear: External ear normal.  Left Ear: External ear normal.  Eyes: Conjunctivae are normal. Pupils are equal, round, and reactive to light.  Neck: Normal range of motion. Neck supple. No tracheal deviation present. No thyromegaly present.  Cardiovascular: Normal rate and regular rhythm. Exam reveals no friction rub.  Murmur heard. Respiratory: Effort normal and breath sounds normal. No stridor. No respiratory distress. She has no wheezes. She has no rales.  GI: Bowel sounds are normal. She exhibits no distension. There is no tenderness.  Musculoskeletal: She exhibits no edema or tenderness.  Neurological: She is alert and oriented to person, place, and time.  Mild left facial weakness. Soft voice but no dysarthria. Able to follow one and two step commands without difficulty. Reasonable insight and awareness. Motor:  Right upper extremity: 5/5 proximal distal  RLE:  5/5HF,KE and 4 to 4+ ADF/PF Left upper; 4/5 deltoid, bicep, tricep, wrist, hand--feeding herself with left hand---good dexterity Left lower extremity and 3+HF, 4-KE 4 ADF/PF DTRs symmetric. No sensory deficits Sensation intact light touch  Skin: Skin is warm and dry. No rash noted. No erythema.  Psychiatric: Thought content normal. Her mood appears slightly anxious.    Assessment/Plan: 1. Gait and functional deficits secondary to right basal ganglia infarct which require 3+ hours per day of interdisciplinary therapy in a comprehensive inpatient rehab setting. Physiatrist is providing close team supervision and 24 hour management of active medical problems listed below. Physiatrist and rehab team continue to assess barriers to discharge/monitor patient progress toward functional and medical goals.  Function:  Bathing Bathing position   Position: Shower  Bathing parts Body parts bathed by patient: Right arm, Left arm, Chest, Abdomen, Front perineal area, Buttocks, Right upper leg, Left upper leg Body parts bathed by helper: Right lower leg, Left lower leg, Back  Bathing assist Assist Level: Touching or steadying assistance(Pt > 75%)      Upper Body Dressing/Undressing Upper body dressing   What is the patient wearing?: Bra, Pull over shirt/dress Bra - Perfomed by patient: Thread/unthread right bra strap, Thread/unthread left bra strap Bra - Perfomed by helper: Hook/unhook bra (pull down sports bra) Pull over shirt/dress - Perfomed by patient: Thread/unthread right sleeve, Thread/unthread left sleeve, Put head through opening Pull over shirt/dress - Perfomed by helper: Pull shirt over trunk        Upper body assist Assist Level: Touching  or steadying assistance(Pt > 75%)      Lower Body Dressing/Undressing Lower body dressing   What is the patient wearing?: Underwear, Pants, Non-skid slipper socks Underwear - Performed by patient: Thread/unthread right underwear leg,  Thread/unthread left underwear leg Underwear - Performed by helper: Pull underwear up/down Pants- Performed by patient: Thread/unthread right pants leg, Thread/unthread left pants leg, Pull pants up/down, Fasten/unfasten pants   Non-skid slipper socks- Performed by patient: Don/doff right sock, Don/doff left sock                    Lower body assist Assist for lower body dressing: Touching or steadying assistance (Pt > 75%)      Toileting Toileting   Toileting steps completed by patient: Adjust clothing prior to toileting, Performs perineal hygiene, Adjust clothing after toileting Toileting steps completed by helper: Adjust clothing after toileting    Toileting assist Assist level: Touching or steadying assistance (Pt.75%)   Transfers Chair/bed transfer   Chair/bed transfer method: Ambulatory Chair/bed transfer assist level: Touching or steadying assistance (Pt > 75%) Chair/bed transfer assistive device: Armrests     Locomotion Ambulation     Max distance: 250 ft Assist level: Touching or steadying assistance (Pt > 75%)   Wheelchair          Cognition Comprehension Comprehension assist level: Follows complex conversation/direction with extra time/assistive device  Expression Expression assist level: Expresses basic 75 - 89% of the time/requires cueing 10 - 24% of the time. Needs helper to occlude trach/needs to repeat words.  Social Interaction Social Interaction assist level: Interacts appropriately 75 - 89% of the time - Needs redirection for appropriate language or to initiate interaction.  Problem Solving Problem solving assist level: Solves basic 75 - 89% of the time/requires cueing 10 - 24% of the time  Memory Memory assist level: Recognizes or recalls 75 - 89% of the time/requires cueing 10 - 24% of the time   Medical Problem List and Plan: 1. Gait and functional deficits secondary to right basal ganglia infarct  -continue CIR therapies 2. DVT  Prophylaxis/Anticoagulation: Pharmaceutical: Lovenox 3. Pain Management:tylenol prn 4. Mood/anxiety: patient has substantial anxiety especially with the sense of "losing control" after the stroke  -pt suffered with baseline anxiety as well  -continue scheduled, LOW dose xanax  neuropsych eval  -team to provide ego supportand positive reinforcement as well 5. Neuropsych: This patient is capable of making decisions on her own behalf. 6. Skin/Wound Care: Routine pressure relief measures.  7. Fluids/Electrolytes/Nutrition: I personally reviewed the patient's labs today.   -encourage po  8. Dyslipidemia: On Crestor 20 mg daily -- alternate Praluent if unable to tolerate statin 9. Hypothyroid: On supplement.  10. Aortic stenosis/MR with DOE: Continue Metoprolol bid.  11. Constipation: On Miralax 12. HTN: monitor BP tid--Norvasc and Diovan on hold at this time to allow for adequate perfusion.    -bp's improving---resume diovan as needed    LOS (Days) 3 A FACE TO FACE EVALUATION WAS PERFORMED  SWARTZ,ZACHARY T 12/20/2015 9:49 AM

## 2015-12-20 NOTE — Plan of Care (Signed)
Problem: RH PAIN MANAGEMENT Goal: RH STG PAIN MANAGED AT OR BELOW PT'S PAIN GOAL <2  Outcome: Progressing No c/o pain   

## 2015-12-20 NOTE — Progress Notes (Signed)
Speech Language Pathology Daily Session Note  Patient Details  Name: Stacey Ramirez MRN: [id number] Date of Birth: 08/09/1940  Today's Date: 12/20/2015  Session 1:SLP Individual Time: 1000-1100 SLP Individual Time Calculation (min): 60 min   Short Term Goals: Week 1: SLP Short Term Goal 1 (Week 1): Pt complete medication management tasks with min A. SLP Short Term Goal 2 (Week 1): Pt complete calendar management tasks with min A. SLP Short Term Goal 3 (Week 1): Pt demonstrate functional sequencing of a multi-step process with min A. SLP Short Term Goal 4 (Week 1): Pt demonstrate self-monitoring/correction to improve speech clarity with compensatory strategies with min A at the conversational level.  SLP Short Term Goal 5 (Week 1): Pt tolerate regular diet consistency at mod I level for management of oral dysphagia. SLP Short Term Goal 6 (Week 1): Pt demonstrate sustained attention to task for 10 minutes with min verbal cueing.  Skilled Therapeutic Interventions:  Session 1: Skilled treatment session focused on speech intelligibility goals. SLP facilitated session by providing supervision-Min A verbal cues for use of speech intelligibility strategies during a structured verbal description task to achieve 100% intelligibility. Patient was also 100% intelligible at the conversation level although she utilized decreased vocal intensity and an increased rate of speech. Patient appeared verbose throughout the session, however, patient's husband reports she is verbose at baseline and that her speech is essentially within functional limits, however, he notices mild dysarthria when she is fatigued. Patient left upright in wheelchair with all needs within reach.  Function:   Cognition Comprehension Comprehension assist level: Follows complex conversation/direction with extra time/assistive device  Expression   Expression assist level: Expresses basic 75 - 89% of the time/requires cueing 10 - 24% of  the time. Needs helper to occlude trach/needs to repeat words.  Social Interaction Social Interaction assist level: Interacts appropriately 75 - 89% of the time - Needs redirection for appropriate language or to initiate interaction.  Problem Solving Problem solving assist level: Solves basic 75 - 89% of the time/requires cueing 10 - 24% of the time  Memory Memory assist level: Recognizes or recalls 75 - 89% of the time/requires cueing 10 - 24% of the time    Pain No/Denies Pain   Therapy/Group: Individual Therapy  Pedro Oldenburg 12/20/2015, 12:38 PM

## 2015-12-20 NOTE — IPOC Note (Signed)
Overall Plan of Care [facility]) Patient Details Name: Stacey Ramirez MRN: [id number] DOB: Sep 22, 1940  Admitting Diagnosis: CVA  Hospital Problems: Principal Problem:   Stroke due to embolism of right middle cerebral artery ([place]) Active Problems:   Left hemiparesis ([place])   Adjustment reaction with anxiety     Functional Problem List: Nursing Endurance, Medication Management, Nutrition, Pain, Safety, Skin Integrity  PT Balance, Behavior, Endurance, Motor, Nutrition, Safety  OT Balance, Safety, Cognition, Skin Integrity, Vision, Edema, Endurance, Motor, Nutrition, Pain, Perception  SLP Cognition, Motor, Nutrition  TR         Basic ADL's: OT Grooming, Bathing, Dressing, Toileting     Advanced  ADL's: OT       Transfers: PT Bed Mobility, Bed to Chair, Car, Manufacturing systems engineer, Metallurgist: PT Ambulation, Emergency planning/management officer, Stairs     Additional Impairments: OT Fuctional Use of Upper Extremity  SLP Swallowing, Social Cognition, Communication   Social Interaction, Problem Solving, Memory, Attention, Awareness  TR      Anticipated Outcomes Item Anticipated Outcome  Self Feeding n/a  Swallowing  mod I regular consistency   Basic self-care  supervision   Toileting  supervision   Bathroom Transfers supervision  Bowel/Bladder  Patient will be continent of bowel and bladder  Transfers  mod I  Locomotion  supervision  Communication  mod I complex  Cognition  min A complex  Pain  pain less than or equal to 4/10  Safety/Judgment  Patient will be free from falls/injury and display sound safety judgement   Therapy Plan: PT Intensity: Minimum of 1-2 x/day ,45 to 90 minutes PT Frequency: 5 out of 7 days PT Duration Estimated Length of Stay: 7-10 days OT Intensity: Minimum of 1-2 x/day, 45 to 90 minutes OT Frequency: 5 out of 7 days OT Duration/Estimated Length of Stay: 10-12 days SLP Intensity: Minumum of 1-2 x/day, 30 to 90 minutes SLP Frequency: 3  to 5 out of 7 days SLP Duration/Estimated Length of Stay: 7-10 days       Team Interventions: Nursing Interventions Patient/Family Education, Pain Management, Medication Management, Skin Care/Wound Management, Discharge Planning  PT interventions Ambulation/gait training, Balance/vestibular training, Cognitive remediation/compensation, Community reintegration, Discharge planning, Disease management/prevention, DME/adaptive equipment instruction, Functional mobility training, Functional electrical stimulation, Neuromuscular re-education, Pain management, Patient/family education, Psychosocial support, Stair training, Therapeutic Activities, Therapeutic Exercise, UE/LE Strength taining/ROM, UE/LE Coordination activities  OT Interventions Training and development officer, Cognitive remediation/compensation, Community reintegration, Engineer, drilling, Neuromuscular re-education, Psychosocial support, Splinting/orthotics, UE/LE Strength taining/ROM, Visual/perceptual remediation/compensation, Therapeutic Exercise, Skin care/wound managment, Patient/family education, Functional mobility training, Disease mangement/prevention, Discharge planning, Pain management, Self Care/advanced ADL retraining, Therapeutic Activities, UE/LE Coordination activities  SLP Interventions Cognitive remediation/compensation, Cueing hierarchy, Multimodal communication approach, Speech/Language facilitation, Functional tasks, Patient/family education, Medication managment, Dysphagia/aspiration precaution training  TR Interventions    SW/CM Interventions Discharge Planning, Psychosocial Support, Patient/Family Education    Team Discharge Planning: Destination: PT-Home ,OT- Home , SLP-Home Projected Follow-up: PT-Home health PT, OT-  Outpatient OT, SLP-Outpatient SLP Projected Equipment Needs: PT-To be determined, OT- To be determined, SLP-None recommended by SLP Equipment Details: PT- , OT-  Patient/family involved in  discharge planning: PT- Family member/caregiver, Patient,  OT-Patient, Family member/caregiver, SLP-Patient, Family member/caregiver  MD ELOS: 7-9d Medical Rehab Prognosis:  Good Assessment: 75 y.o. female with history of HTN, aortic stenosis, L-CVA 2014 with gait disorder and minimal RLE weakness who was admitted on 12/13/15 with left sided weakness and dysarthric speech. MRI/MRA brain done revealing acute  infarct in right basal ganglia with mild progression of PCA atherosclerosis and no large vessel occlusion. 2D echo with EF 60-65% with mild LVH, no aortic or mitral stenosis and no wall abnormality. Carotid dopplers without significant ICA stenosis. ASA changed to plavix for stroked due to small vessel disease and uncontrolled hyperlipidemia as patient intolerant of statins. Crestor increased to 10 mg daily and low dose BB added due to recurrent SVT. Plans for TEE done 5/16 and showed normal LVF and no PFO. Loop recorder placed by Dr. Rayann Heman   Now requiring 24/7 Rehab RN,MD, as well as CIR level PT, OT and SLP.  Treatment team will focus on ADLs and mobility with goals set at supervision  See Team Conference Notes for weekly updates to the plan of care

## 2015-12-21 ENCOUNTER — Inpatient Hospital Stay (HOSPITAL_COMMUNITY): Payer: PPO | Admitting: Occupational Therapy

## 2015-12-21 ENCOUNTER — Inpatient Hospital Stay (HOSPITAL_COMMUNITY): Payer: PPO | Admitting: Physical Therapy

## 2015-12-21 DIAGNOSIS — I63411 Cerebral infarction due to embolism of right middle cerebral artery: Secondary | ICD-10-CM | POA: Diagnosis not present

## 2015-12-21 DIAGNOSIS — G44219 Episodic tension-type headache, not intractable: Secondary | ICD-10-CM | POA: Diagnosis not present

## 2015-12-21 DIAGNOSIS — G8194 Hemiplegia, unspecified affecting left nondominant side: Secondary | ICD-10-CM | POA: Diagnosis not present

## 2015-12-21 DIAGNOSIS — F4322 Adjustment disorder with anxiety: Secondary | ICD-10-CM | POA: Diagnosis not present

## 2015-12-21 NOTE — Progress Notes (Signed)
Physical Therapy Session Note  Patient Details  Name: Stacey Ramirez MRN: [id number] Date of Birth: [birth year]  Today's Date: 12/21/2015 PT Individual Time: 0900-1000 AND 1435 - 1500 PT Individual Time Calculation (min): 60 min AND 25 min   Short Term Goals: Week 1:  PT Short Term Goal 1 (Week 1): = LTGs due to anticipated LOS  Skilled Therapeutic Interventions/Progress Updates:  Session 1   Patient received sitting in WC, and agreeable to PT.  Patient performed gait training x 2 for 273ft with HHA progressing to no HHA and Min A from PT. Min cues for improved LLE terminal knee extension and step height as well as improved focus on task to prevent Lateral LOB.   NMR for LLE stepping balance strategies: lateral and cross body stepping to one of 4 targets 2x 12 BLE min-mod A from PT to prevent posterior LOB. 4 square stepping with mod A from PT x 5 in Bilateral directions. Patient demonstrated poor righting reactions with posterior LOB with minimal hip or ankle strategy.  Biodex balance training Biodex weight shifting with mod cues for improved hip strategy in all directions 2x 2 minutes  Biodex LOS level 1 x 3 with no UE support with mod cues for improved hip strategy for end range weight shifting. Patient noted to have increased difficulty with R anterior weight shifting.   Patient performed all transfers with min A and push from sitting surface throughout treatment   Patient left sitting in [facility] with call bell within reach at end of PT session.   Session 2  Patient received sitting in WC and agreeable to PT.  Gait training performed in hall with min A-progressing to supervision A for 262ft x 2 with no AD. Min-mod cues for improved step length on LLE, improve forward gaze, and increased reciprocal arm swing to improve balance and normalize gait pattern.   Patient instructed in NMR to build field goal with pipe tree while standing on airex pad with min A progressing to supervision A. Min cues  for error dectection as well as decreased UE support on lap trap increase use of hip ankle strategy to maintain balance.   Patient returned to room and left sitting in [facility] at end of treatment session with call bell within reach and family present.       Therapy Documentation Precautions:  Precautions Precautions: Fall Precaution Comments: leans to the left Restrictions Weight Bearing Restrictions: No General:   Vital Signs: Therapy Vitals Pulse Rate: 75 BP: (!) 132/49 mmHg Patient Position (if appropriate): Sitting Pain: Pain Assessment Pain Assessment: No/denies pain Pain Score: 0-No pain   See Function Navigator for Current Functional Status.   Therapy/Group: Individual Therapy  Lorie Phenix 12/21/2015, 10:03 AM

## 2015-12-21 NOTE — Progress Notes (Signed)
Occupational Therapy Session Note  Patient Details  Name: YENTY COCKRAN MRN: [id number] Date of Birth: 05/15/1941  Today's Date: 12/21/2015 OT Individual Time: [id number] OT Individual Time Calculation (min): 75 min    Short Term Goals: Week 1:  OT Short Term Goal 1 (Week 1): Pt will maintain midline during standing while performing functional task with min A with min cues. OT Short Term Goal 2 (Week 1): Pt will demonstrate selective attention during ADL tasks with min cuing OT Short Term Goal 3 (Week 1): Pt will perform toileting with close supervision with VC OT Short Term Goal 4 (Week 1): Pt will perform a basic transfer with close supervision bed to chair  Skilled Therapeutic Interventions/Progress Updates:  Focus for this session was dynamic balance, especially with visual and verbal distractions.   Patient completed toileting and toilet transfer with CGA for balance while standing.      She stood to brush her teeth balancing at time with left hand on the sink.  As well, she completed other dynamic balance activities with distractions with CGA to min A (Min a when fatigued or "feeling woozy - not dizzy - the room is not spinning but I feel woozy at times.")  As well, she completed visual vestibular type activites to challenge balance.     Therapy Documentation Precautions:  Precautions Precautions: Fall Precaution Comments: leans to the left Restrictions Weight Bearing Restrictions: No  Pain:denied      See Function Navigator for Current Functional Status.   Therapy/Group: Individual Therapy  Alfredia Ferguson [facility] 12/21/2015, 9:41 AM

## 2015-12-21 NOTE — Progress Notes (Signed)
Occupational Therapy Session Note  Patient Details  Name: Stacey Ramirez MRN: [id number] Date of Birth: 1941/03/12  Today's Date: 12/21/2015 OT Individual Time: [id number] OT Individual Time Calculation (min): 30 min    Short Term Goals: Week 1:  OT Short Term Goal 1 (Week 1): Pt will maintain midline during standing while performing functional task with min A with min cues. OT Short Term Goal 2 (Week 1): Pt will demonstrate selective attention during ADL tasks with min cuing OT Short Term Goal 3 (Week 1): Pt will perform toileting with close supervision with VC OT Short Term Goal 4 (Week 1): Pt will perform a basic transfer with close supervision bed to chair  Skilled Therapeutic Interventions/Progress Updates: this session patient participated as follows:          Maintained midline balance to fold medium weight bed spread x2 with close S;          She completed toileting with close S with use of grab bar for stability (close S for toilet transfer as well);        Patient was left in her w/c with dtr at end of session             Therapy Documentation Precautions:  Precautions Precautions: Fall Precaution Comments: leans to the left Restrictions Weight Bearing Restrictions: No  Pain: denied    See Function Navigator for Current Functional Status.   Therapy/Group: Individual Therapy  Alfredia Ferguson [facility] 12/21/2015, 3:25 PM

## 2015-12-21 NOTE — Progress Notes (Addendum)
Patient's blood pressure 132/49, heart rate 75.  Notified Dr. Inda Merlin and he advises to give metoprolol as scheduled.  Will continue to monitor.

## 2015-12-21 NOTE — Progress Notes (Addendum)
Stacey Ramirez is a 75 y.o. female November 30, 1940 [id number]  Subjective: C/o dizziness x long time. Slept well. Feeling OK.  Objective: Vital signs in last 24 hours: Temp:  [98 F (36.7 C)-98.1 F (36.7 C)] 98 F (36.7 C) (05/20 0517) Pulse Rate:  [63-75] 75 (05/20 0900) Resp:  [17-18] 18 (05/20 0517) BP: (115-145)/(49-63) 132/49 mmHg (05/20 0900) SpO2:  [97 %-99 %] 99 % (05/20 0517) Weight change:  Last BM Date: 12/19/15  Intake/Output from previous day: 05/19 0701 - 05/20 0700 In: 720 [P.O.:720] Out: -  Last cbgs: CBG (last 3)  No results for input(s): GLUCAP in the last 72 hours.   Physical Exam General: No apparent distress   HEENT: not dry Lungs: Normal effort. Lungs clear to auscultation, no crackles or wheezes. Cardiovascular: Regular rate and rhythm, no edema Abdomen: S/NT/ND; BS(+) Musculoskeletal:  unchanged Neurological: No new neurological deficits Wounds: N/A    Skin: clear  Aging changes Mental state: Alert, cooperative    Lab Results: BMET    Component Value Date/Time   NA 142 12/18/2015 0428   K 4.2 12/18/2015 0428   CL 105 12/18/2015 0428   CO2 28 12/18/2015 0428   GLUCOSE 101* 12/18/2015 0428   BUN 12 12/18/2015 0428   BUN 12 04/14/2013 0921   CREATININE 0.66 12/18/2015 0428   CALCIUM 9.3 12/18/2015 0428   GFRNONAA >60 12/18/2015 0428   GFRAA >60 12/18/2015 0428   CBC    Component Value Date/Time   WBC 6.7 12/18/2015 0428   RBC 4.40 12/18/2015 0428   HGB 13.7 12/18/2015 0428   HCT 41.8 12/18/2015 0428   PLT 226 12/18/2015 0428   MCV 95.0 12/18/2015 0428   MCH 31.1 12/18/2015 0428   MCHC 32.8 12/18/2015 0428   RDW 12.6 12/18/2015 0428   LYMPHSABS 2.5 12/18/2015 0428   MONOABS 0.7 12/18/2015 0428   EOSABS 0.3 12/18/2015 0428   BASOSABS 0.0 12/18/2015 0428    Studies/Results: No results found.  Medications: I have reviewed the patient's current medications.  Assessment/Plan:  1. Gait and functional deficits secondary to  right basal ganglia infarct -continue CIR therapies 2. DVT Prophylaxis/Anticoagulation: Pharmaceutical: Lovenox 3. Pain Management:tylenol prn 4. Mood/anxiety: patient has substantial anxiety especially with the sense of "losing control" after the stroke -pt suffered with baseline anxiety as well -continue scheduled, LOW dose xanax 5. Neuropsych: This patient is capable of making decisions on her own behalf. 6. Skin/Wound Care: Routine pressure relief measures.  7. Fluids/Electrolytes/Nutrition: I personally reviewed the patient's labs today.  -encourage po  8. Dyslipidemia: On Crestor 20 mg daily. Praluent if unable to tolerate statin 9. Hypothyroid: On supplement.  10. Aortic stenosis/MR with DOE: Continue Metoprolol bid.  11. Constipation: On Miralax 12. HTN: monitor BP tid--Norvasc and Diovan on hold at this time to allow for adequate perfusion.  13. Dizziness. Likely multifactorial. Will watch...   Length of stay, days: [place] , MD 12/21/2015, 9:32 AM

## 2015-12-22 DIAGNOSIS — F4322 Adjustment disorder with anxiety: Secondary | ICD-10-CM | POA: Diagnosis not present

## 2015-12-22 DIAGNOSIS — I63411 Cerebral infarction due to embolism of right middle cerebral artery: Secondary | ICD-10-CM | POA: Diagnosis not present

## 2015-12-22 DIAGNOSIS — G8194 Hemiplegia, unspecified affecting left nondominant side: Secondary | ICD-10-CM | POA: Diagnosis not present

## 2015-12-22 DIAGNOSIS — G44219 Episodic tension-type headache, not intractable: Secondary | ICD-10-CM

## 2015-12-22 NOTE — Progress Notes (Addendum)
Stacey Ramirez is a 75 y.o. female [birth year] [id number]  Subjective: C/o dizziness x long time. Had a HA last night - resolved. Slept well. Feeling OK this am.  Objective: Vital signs in last 24 hours: Temp:  [97.8 F (36.6 C)-98.3 F (36.8 C)] 97.8 F (36.6 C) (05/21 0554) Pulse Rate:  [64-78] 64 (05/21 0900) Resp:  [18] 18 (05/21 0554) BP: (138-168)/(45-63) 150/54 mmHg (05/21 0900) SpO2:  [96 %-99 %] 96 % (05/21 0554) Weight change:  Last BM Date: 12/19/15  Intake/Output from previous day: 05/20 0701 - 05/21 0700 In: 960 [P.O.:960] Out: -  Last cbgs: CBG (last 3)  No results for input(s): GLUCAP in the last 72 hours.   Physical Exam General: No apparent distress - eating breakfast. Husband is in the room   HEENT: not dry Lungs: Normal effort. Lungs clear to auscultation, no crackles or wheezes. Cardiovascular: Regular rate and rhythm, no edema Abdomen: S/NT/ND; BS(+) Musculoskeletal:  unchanged Neurological: No new neurological deficits Wounds: N/A    Skin: clear  Aging changes Mental state: Alert, cooperative    Lab Results: BMET    Component Value Date/Time   NA 142 12/18/2015 0428   K 4.2 12/18/2015 0428   CL 105 12/18/2015 0428   CO2 28 12/18/2015 0428   GLUCOSE 101* 12/18/2015 0428   BUN 12 12/18/2015 0428   BUN 12 04/14/2013 0921   CREATININE 0.66 12/18/2015 0428   CALCIUM 9.3 12/18/2015 0428   GFRNONAA >60 12/18/2015 0428   GFRAA >60 12/18/2015 0428   CBC    Component Value Date/Time   WBC 6.7 12/18/2015 0428   RBC 4.40 12/18/2015 0428   HGB 13.7 12/18/2015 0428   HCT 41.8 12/18/2015 0428   PLT 226 12/18/2015 0428   MCV 95.0 12/18/2015 0428   MCH 31.1 12/18/2015 0428   MCHC 32.8 12/18/2015 0428   RDW 12.6 12/18/2015 0428   LYMPHSABS 2.5 12/18/2015 0428   MONOABS 0.7 12/18/2015 0428   EOSABS 0.3 12/18/2015 0428   BASOSABS 0.0 12/18/2015 0428    Studies/Results: No results found.  Medications: I have reviewed the patient's current  medications.  Assessment/Plan:  1. Gait and functional deficits secondary to right basal ganglia infarct -continue CIR therapies 2. DVT Prophylaxis/Anticoagulation: Pharmaceutical: Lovenox 3. Pain Management:tylenol prn 4. Mood/anxiety: patient has substantial anxiety especially with the sense of "losing control" after the stroke -pt suffered with baseline anxiety as well -continue scheduled, LOW dose xanax 5. Neuropsych: This patient is capable of making decisions on her own behalf. 6. Skin/Wound Care: Routine pressure relief measures.  7. Fluids/Electrolytes/Nutrition: I personally reviewed the patient's labs today.  -encourage po  8. Dyslipidemia: On Crestor 20 mg daily. Praluent if unable to tolerate statin 9. Hypothyroid: On supplement.  10. Aortic stenosis/MR with DOE: Continue Metoprolol bid.  11. Constipation: On Miralax 12. HTN: monitor BP tid--Norvasc and Diovan on hold at this time to allow for adequate perfusion.  13. Dizziness. Likely multifactorial. Will watch... 14. HA - resolved  Cont current Rx   Length of stay, days: 5  Walker Kehr , MD 12/22/2015, 9:28 AM

## 2015-12-23 ENCOUNTER — Inpatient Hospital Stay (HOSPITAL_COMMUNITY): Payer: PPO | Admitting: Physical Therapy

## 2015-12-23 ENCOUNTER — Inpatient Hospital Stay (HOSPITAL_COMMUNITY): Payer: PPO | Admitting: Occupational Therapy

## 2015-12-23 ENCOUNTER — Inpatient Hospital Stay (HOSPITAL_COMMUNITY): Payer: PPO | Admitting: Speech Pathology

## 2015-12-23 ENCOUNTER — Inpatient Hospital Stay (HOSPITAL_COMMUNITY): Payer: PPO

## 2015-12-23 DIAGNOSIS — F4322 Adjustment disorder with anxiety: Secondary | ICD-10-CM | POA: Diagnosis not present

## 2015-12-23 DIAGNOSIS — G8194 Hemiplegia, unspecified affecting left nondominant side: Secondary | ICD-10-CM | POA: Diagnosis not present

## 2015-12-23 DIAGNOSIS — I63411 Cerebral infarction due to embolism of right middle cerebral artery: Secondary | ICD-10-CM | POA: Diagnosis not present

## 2015-12-23 MED ORDER — ALPRAZOLAM 0.25 MG PO TABS
0.2500 mg | ORAL_TABLET | Freq: Two times a day (BID) | ORAL | Status: DC
Start: 2015-12-23 — End: 2015-12-25
  Administered 2015-12-23 – 2015-12-24 (×2): 0.25 mg via ORAL
  Filled 2015-12-23 (×4): qty 1

## 2015-12-23 NOTE — Progress Notes (Signed)
Refusing AM dose of Xanax, reluctantly taking HS dose.  Complains regularly of feeling "squirrely", but feels she doesn't need xanax. Taking PRN tylenol for HA. Loop recorder dressing with old blood on dressing. Stacey Ramirez A

## 2015-12-23 NOTE — Progress Notes (Addendum)
Occupational Therapy Session Note  Patient Details  Name: Stacey Ramirez MRN: [id number] Date of Birth: 1941/05/10  Today's Date: 12/24/2015 OT Individual Time:   1100-1200 Calculated time: 60 mins   Short Term Goals: Week 1:  OT Short Term Goal 1 (Week 1): Pt will maintain midline during standing while performing functional task with min A with min cues. OT Short Term Goal 2 (Week 1): Pt will demonstrate selective attention during ADL tasks with min cuing OT Short Term Goal 3 (Week 1): Pt will perform toileting with close supervision with VC OT Short Term Goal 4 (Week 1): Pt will perform a basic transfer with close supervision bed to chair  Skilled Therapeutic Interventions/Progress Updates:    Pt resting in chair upon arrival.  Pt stated that she had already bathed her LB and changed pants (husband verified) but that she would like to bathe her UB and change her shirt.  Pt completed all tasks including brushing her teeth while standing at sink.  Pt amb with HHA to ADL apartment and engaged in simple meal prep activity. Pt completed all tasks at close supervision level.  Pt talked constantly throughout session which husband states is her baseline.    Therapy Documentation Precautions:  Precautions Precautions: Fall Precaution Comments: leans to the left Restrictions Weight Bearing Restrictions: No Pain:  Pt denied pain ADL: ADL ADL Comments: see functional navigator  See Function Navigator for Current Functional Status.   Therapy/Group: Individual Therapy  Leroy Libman 12/24/2015, 6:47 AM

## 2015-12-23 NOTE — Progress Notes (Addendum)
Occupational Therapy Session Note  Patient Details  Name: Stacey Ramirez MRN: [id number] Date of Birth: 1941/07/17  Today's Date: 12/23/2015 OT Individual Time: [id number] OT Individual Time Calculation (min): 37 min    Short Term Goals: Week 1:  OT Short Term Goal 1 (Week 1): Pt will maintain midline during standing while performing functional task with min A with min cues. OT Short Term Goal 2 (Week 1): Pt will demonstrate selective attention during ADL tasks with min cuing OT Short Term Goal 3 (Week 1): Pt will perform toileting with close supervision with VC OT Short Term Goal 4 (Week 1): Pt will perform a basic transfer with close supervision bed to chair  Skilled Therapeutic Interventions/Progress Updates: Patient c/o of another night of feeling "like I could not breathe and was stuck in myself.   I did not get sleep; so, I am tired walking around here this morning" and "I feel swimmy headed more because I did not get sleep."  As compared to her functional ambulation for self care after nights of sleep, today she requested Hand Held Assist for walking to/from the ADL apt after she stated, "I am a little shakey, tired and weak after no sleep.   Can I hold onto you?"    Otherwise, she completed refrigerator access with CGA; made the bed in the ADL apt with close S (not holding onto this clinician for stability and not not voicing the need to sit during the 4 pillow case changes, nor during making the bed.    Also she completed a shower transfer via the blue threshold in the ADL apt with close S as she supported herself with one hand on he wall (the shower frame was set up similar to her set up at home)  She stated she has utilized lavender (or lilac or possible other - she could not remember for sure) by a nurse a couple days ago to help with relaxation.   This clinician spoke with her and her primary nurse today regarding essential oil/aromatherapy as well.    At the end of the session, she  was left seated in a chair in her room with her supportive husband close by     Therapy Documentation Precautions:  Precautions Precautions: Fall Precaution Comments: leans to the left Restrictions Weight Bearing Restrictions: No  Pain:denied    See Function Navigator for Current Functional Status.   Therapy/Group: Individual Therapy  Alfredia Ferguson [facility] 12/23/2015, 12:09 PM

## 2015-12-23 NOTE — Progress Notes (Signed)
Physical Therapy Session Note  Patient Details  Name: Stacey Ramirez MRN: [id number] Date of Birth: 20-Jan-1941  Today's Date: 12/23/2015 PT Individual Time: 1505-1605 PT Individual Time Calculation (min): 60 min   Short Term Goals: Week 1:  PT Short Term Goal 1 (Week 1): = LTGs due to anticipated LOS  Skilled Therapeutic Interventions/Progress Updates:   Pt received in room with husband present.  Discussed with pt and husband possibility of D/C home in a couple days; pt feels she will do better at home and that she becomes very anxious at night here.  Discussed goals and use of treatment session today for family education and to assess pt progress towards goals.  Pt and husband agreeable.  Pt performed ambulation from room > ADL apartment <150' with close supervision with intermittent verbal cues needed for upright gaze, posture, anterior rotation of shoulders and pelvis during gait and full LLE clearance, step length and heel strike; pt with intermittent lateral LOB to L but able to self recover.  In apartment pt perform functional furniture transfers with supervision and demonstrated safe flat bed mobility, rolling and supine <> sit with supervision.  Pt and husband demonstrated safe simulated mid size SUV transfer with pt performing with supervision and verbal cues from husband.  Discussed home entry/exit-Pt performed one step negotiation x 5 reps with therapist HHA (due to no rail) and then with husband x 4 reps with husband cuing pt to pause before stepping and for safe stepping sequence.  Also demonstrated how to perform bilat HHA when pt descending for increased confidence, balance and safety.  Pt transitioned to supine on mat where therapist led pt (also instructing husband) through grounding activity and deep breathing to manage anxiety at night.  Pt did report feeling very relaxed and ready to fall asleep.  Transitioned back to sitting and back to room with supervision.  Pt and husband able to  demonstrate all necessary mobility for home safely; feel pt will be safe to D/C home Wednesday with HHPT if pt has met OT and SLP goals as well.     Therapy Documentation Precautions:  Precautions Precautions: Fall Precaution Comments: leans to the left Restrictions Weight Bearing Restrictions: No Vital Signs: Therapy Vitals Temp: 98.4 F (36.9 C) Temp Source: Oral Pulse Rate: 71 Resp: 17 BP: (!) 127/53 mmHg Patient Position (if appropriate): Lying Oxygen Therapy SpO2: 99 % O2 Device: Not Delivered Pain:  No c/o pain   See Function Navigator for Current Functional Status.   Therapy/Group: Individual Therapy  Stacey Ramirez [facility] 12/23/2015, 4:33 PM

## 2015-12-23 NOTE — Progress Notes (Signed)
Poor night sleep R/T anxiety, "panic thing".  Patient feels xanax makes it worse, although she rested good on Saturday night, after taking med. Stacey Ramirez

## 2015-12-23 NOTE — Progress Notes (Signed)
Speech Language Pathology Daily Session Note  Patient Details  Name: Stacey Ramirez MRN: [id number] Date of Birth: 29-May-1941  Today's Date: 12/23/2015 SLP Individual Time: 1015-1100 SLP Individual Time Calculation (min): 45 min  Short Term Goals: Week 1: SLP Short Term Goal 1 (Week 1): Pt complete medication management tasks with min A. SLP Short Term Goal 2 (Week 1): Pt complete calendar management tasks with min A. SLP Short Term Goal 3 (Week 1): Pt demonstrate functional sequencing of a multi-step process with min A. SLP Short Term Goal 4 (Week 1): Pt demonstrate self-monitoring/correction to improve speech clarity with compensatory strategies with min A at the conversational level.  SLP Short Term Goal 5 (Week 1): Pt tolerate regular diet consistency at mod I level for management of oral dysphagia. SLP Short Term Goal 6 (Week 1): Pt demonstrate sustained attention to task for 10 minutes with min verbal cueing.  Skilled Therapeutic Interventions: Skilled treatment session focused on cognitive goals. SLP facilitated session by providing Min-Mod A verbal and visual cues for problem solving during a mildly complex, novel task of organizing a BID pill box. Patient demonstrated selective attention to task for ~15 minutes with Min A verbal cues for redirection in a mildly distracting environment. Patient left upright in wheelchair with husband present. Continue with current plan of care.    Function:  Cognition Comprehension Comprehension assist level: Follows basic conversation/direction with extra time/assistive device  Expression   Expression assist level: Expresses basic needs/ideas: With extra time/assistive device  Social Interaction Social Interaction assist level: Interacts appropriately 75 - 89% of the time - Needs redirection for appropriate language or to initiate interaction.  Problem Solving Problem solving assist level: Solves basic 90% of the time/requires cueing < 10% of the  time  Memory Memory assist level: Recognizes or recalls 75 - 89% of the time/requires cueing 10 - 24% of the time    Pain No/Denies Pain   Therapy/Group: Individual Therapy  [place]leg[place] Dominique 12/23/2015, 3:34 PM

## 2015-12-23 NOTE — Progress Notes (Signed)
[facility] PHYSICAL MEDICINE & REHABILITATION     PROGRESS NOTE    Subjective/Complaints: Did not sleep well. Was short of breath. Very anxious. Doesn't like it that shes unable to move like she's accustomed to in bed to adjust herself. Anxious this morning  ROS: Pt denies fever, rash/itching, headache, blurred or double vision, nausea, vomiting, abdominal pain, diarrhea, chest pain, shortness of breath, palpitations, dysuria, dizziness, neck or back pain, bleeding, anxiety, or depression   Objective: Vital Signs: Blood pressure 169/78, pulse 71, temperature 97.9 F (36.6 C), temperature source Oral, resp. rate 18, SpO2 100 %. No results found. No results for input(s): WBC, HGB, HCT, PLT in the last 72 hours. No results for input(s): NA, K, CL, GLUCOSE, BUN, CREATININE, CALCIUM in the last 72 hours.  Invalid input(s): CO CBG (last 3)  No results for input(s): GLUCAP in the last 72 hours.  Wt Readings from Last 3 Encounters:  12/17/15 52.164 kg (115 lb)  04/10/15 52.98 kg (116 lb 12.8 oz)  10/04/14 53.252 kg (117 lb 6.4 oz)    Physical Exam:  Head: Normocephalic and atraumatic.  Right Ear: External ear normal.  Left Ear: External ear normal.  Eyes: Conjunctivae are normal. Pupils are equal, round, and reactive to light.  Neck: Normal range of motion. Neck supple. No tracheal deviation present. No thyromegaly present.  Cardiovascular: Normal rate and regular rhythm. Exam reveals no friction rub.  Murmur heard. Respiratory: Effort normal and breath sounds normal. No stridor. No respiratory distress. She has no wheezes. She has no rales.  GI: Bowel sounds are normal. She exhibits no distension. There is no tenderness.  Musculoskeletal: She exhibits no edema or tenderness.  Neurological: She is alert and oriented to person, place, and time.  Mild left facial weakness. Soft voice but no dysarthria. Able to follow one and two step commands without difficulty. Reasonable insight  and awareness. Motor:  Right upper extremity: 5/5 proximal distal  RLE: 5/5HF,KE and 4 to 4+ ADF/PF Left upper; 4/5 deltoid, bicep, tricep, wrist, hand--feeding herself with left hand---good dexterity Left lower extremity and 3+HF, 4-KE 4 ADF/PF DTRs symmetric. No sensory deficits Sensation intact light touch  Skin: Skin is warm and dry. No rash noted. No erythema.  Psychiatric: remains anxious but pleasant. Often apologizes for her anxiety    Assessment/Plan: 1. Gait and functional deficits secondary to right basal ganglia infarct which require 3+ hours per day of interdisciplinary therapy in a comprehensive inpatient rehab setting. Physiatrist is providing close team supervision and 24 hour management of active medical problems listed below. Physiatrist and rehab team continue to assess barriers to discharge/monitor patient progress toward functional and medical goals.  Function:  Bathing Bathing position Bathing activity did not occur: Refused Position: Wheelchair/chair at sink  Bathing parts Body parts bathed by patient: Right arm, Left arm, Chest, Abdomen, Front perineal area, Buttocks, Right upper leg, Left upper leg, Right lower leg, Left lower leg Body parts bathed by helper: Right lower leg, Left lower leg, Back  Bathing assist Assist Level: Supervision or verbal cues      Upper Body Dressing/Undressing Upper body dressing Upper body dressing/undressing activity did not occur: Refused What is the patient wearing?: Bra, Pull over shirt/dress Bra - Perfomed by patient: Thread/unthread right bra strap, Thread/unthread left bra strap Bra - Perfomed by helper: Hook/unhook bra (pull down sports bra) Pull over shirt/dress - Perfomed by patient: Thread/unthread right sleeve, Thread/unthread left sleeve, Put head through opening Pull over shirt/dress - Perfomed by helper:  Pull shirt over trunk        Upper body assist Assist Level: Touching or steadying assistance(Pt > 75%)       Lower Body Dressing/Undressing Lower body dressing   What is the patient wearing?: Pants Underwear - Performed by patient: Thread/unthread right underwear leg, Thread/unthread left underwear leg, Pull underwear up/down Underwear - Performed by helper: Pull underwear up/down Pants- Performed by patient: Thread/unthread right pants leg, Thread/unthread left pants leg, Pull pants up/down, Fasten/unfasten pants   Non-skid slipper socks- Performed by patient: Don/doff right sock, Don/doff left sock                    Lower body assist Assist for lower body dressing: Supervision or verbal cues      Toileting Toileting   Toileting steps completed by patient: Adjust clothing prior to toileting, Performs perineal hygiene, Adjust clothing after toileting Toileting steps completed by helper: Adjust clothing after toileting Toileting Assistive Devices: Grab bar or rail  Toileting assist Assist level: Touching or steadying assistance (Pt.75%)   Transfers Chair/bed transfer   Chair/bed transfer method: Ambulatory Chair/bed transfer assist level: Touching or steadying assistance (Pt > 75%) Chair/bed transfer assistive device: Armrests     Locomotion Ambulation     Max distance: 258ft Assist level: Touching or steadying assistance (Pt > 75%)   Wheelchair          Cognition Comprehension Comprehension assist level: Follows basic conversation/direction with extra time/assistive device  Expression Expression assist level: Expresses basic 75 - 89% of the time/requires cueing 10 - 24% of the time. Needs helper to occlude trach/needs to repeat words.  Social Interaction Social Interaction assist level: Interacts appropriately 75 - 89% of the time - Needs redirection for appropriate language or to initiate interaction.  Problem Solving Problem solving assist level: Solves basic 75 - 89% of the time/requires cueing 10 - 24% of the time  Memory Memory assist level: Recognizes or recalls 75  - 89% of the time/requires cueing 10 - 24% of the time   Medical Problem List and Plan: 1. Gait and functional deficits secondary to right basal ganglia infarct  -continue CIR therapies---making nice functional gains 2. DVT Prophylaxis/Anticoagulation: Pharmaceutical: Lovenox 3. Pain Management:tylenol prn 4. Mood/anxiety: patient has substantial anxiety especially with the sense of "losing control" after the stroke  -affecting sleep  -pt suffered with baseline anxiety as well  -will increase xanax to 0.25mg   neuropsych eval and treat requested  -team to provide ego supportand positive reinforcement as well  -anxiety has been honestly a bigger issue than her stroke 5. Neuropsych: This patient is capable of making decisions on her own behalf. 6. Skin/Wound Care: Routine pressure relief measures.  7. Fluids/Electrolytes/Nutrition: I personally reviewed the patient's labs today.   -encourage po  8. Dyslipidemia: On Crestor 20 mg daily -- alternate Praluent if unable to tolerate statin 9. Hypothyroid: On supplement.  10. Aortic stenosis/MR with DOE: Continue Metoprolol bid.  11. Constipation: On Miralax 12. HTN: monitor BP tid--Norvasc and Diovan on hold at this time to allow for adequate perfusion.    -bp's improving---resume diovan as needed    LOS (Days) 6 A FACE TO FACE EVALUATION WAS PERFORMED  SWARTZ,ZACHARY T 12/23/2015 9:13 AM

## 2015-12-24 ENCOUNTER — Inpatient Hospital Stay (HOSPITAL_COMMUNITY): Payer: PPO | Admitting: Speech Pathology

## 2015-12-24 ENCOUNTER — Inpatient Hospital Stay (HOSPITAL_COMMUNITY): Payer: PPO | Admitting: Physical Therapy

## 2015-12-24 ENCOUNTER — Inpatient Hospital Stay (HOSPITAL_COMMUNITY): Payer: PPO | Admitting: *Deleted

## 2015-12-24 ENCOUNTER — Inpatient Hospital Stay (HOSPITAL_COMMUNITY): Payer: PPO

## 2015-12-24 DIAGNOSIS — G8194 Hemiplegia, unspecified affecting left nondominant side: Secondary | ICD-10-CM | POA: Diagnosis not present

## 2015-12-24 DIAGNOSIS — I63411 Cerebral infarction due to embolism of right middle cerebral artery: Secondary | ICD-10-CM | POA: Diagnosis not present

## 2015-12-24 DIAGNOSIS — F4322 Adjustment disorder with anxiety: Secondary | ICD-10-CM | POA: Diagnosis not present

## 2015-12-24 LAB — URINALYSIS, ROUTINE W REFLEX MICROSCOPIC
Bilirubin Urine: NEGATIVE
Glucose, UA: NEGATIVE mg/dL
Ketones, ur: NEGATIVE mg/dL
NITRITE: NEGATIVE
PROTEIN: NEGATIVE mg/dL
SPECIFIC GRAVITY, URINE: 1.027 (ref 1.005–1.030)
pH: 5.5 (ref 5.0–8.0)

## 2015-12-24 LAB — URINE MICROSCOPIC-ADD ON

## 2015-12-24 MED ORDER — ROSUVASTATIN CALCIUM 10 MG PO TABS: 10.0000 mg | ORAL_TABLET | Freq: Every day | ORAL | Status: DC

## 2015-12-24 MED ORDER — CLOPIDOGREL BISULFATE 75 MG PO TABS: 75.0000 mg | ORAL_TABLET | Freq: Every day | ORAL | Status: DC

## 2015-12-24 MED ORDER — METOPROLOL TARTRATE 25 MG PO TABS: 12.5000 mg | ORAL_TABLET | Freq: Two times a day (BID) | ORAL | Status: DC

## 2015-12-24 NOTE — Progress Notes (Signed)
Speech Language Pathology Session Note & Discharge Summary  Patient Details  Name: Stacey Ramirez MRN: [id number] Date of Birth: 1941-06-27  Today's Date: 12/24/2015 SLP Individual Time: 0800-0900 SLP Individual Time Calculation (min): 60 min   Skilled Therapeutic Interventions:  Skilled treatment session focused on cognitive goals. Upon arrival, patient was consuming her breakfast meal of regular textures with thin liquids without overt s/s of aspiration and was Mod I for use of swallowing compensatory strategies. Patient was able to alternate her attention between a functional conversation about d/c planning and self-feeding with Min A verbal and question cues. Patient was re-administered the MoCA (version 7.1) and scored 21/30 points with a score of 26 or above considered normal. Patient continues to demonstrate deficits in the areas of short-term recall, therefore, the patient and her husband were educated in regards to strategies to utilize at home to maximize recall and overall safety. Both verbalized understanding and handouts were given to reinforce information. Patient left upright in chair with family present.   Patient has met 6 of 6 long term goals.  Patient to discharge at overall Supervision;Min level.   Reasons goals not met: N/A   Clinical Impression/Discharge Summary: Patient has made functional gains and has met 6 of 6 LTG's this admission due to improved cognitive function, swallowing function and speech intelligibility. Currently, patient requires overall supervision-Min A to complete functional and familiar tasks safely in regards to problem solving, attention and recall with use of compensatory strategies. Patient is consuming regular textures with thin liquids without overt s/s of aspiration and is Mod I for use of swallowing compensatory strategies. Patient is also 100% intelligible at the conversation level with Mod I. Patient and family education is complete and patient will  discharge home with 24 hour supervision from family. Patient would benefit from f/u SLP services to maximize cognitive function and overall functional independence to reduce caregiver burden.   Care Partner:  Caregiver Able to Provide Assistance: Yes  Type of Caregiver Assistance: Physical;Cognitive  Recommendation:  Home Health SLP  Rationale for SLP Follow Up: Maximize cognitive function and independence;Reduce caregiver burden   Equipment: N/A   Reasons for discharge: Treatment goals met;Discharged from hospital   Patient/Family Agrees with Progress Made and Goals Achieved: Yes   Function:  Eating Eating   Modified Consistency Diet: No Eating Assist Level: No help, No cues           Cognition Comprehension Comprehension assist level: Follows basic conversation/direction with extra time/assistive device  Expression   Expression assist level: Expresses basic needs/ideas: With extra time/assistive device  Social Interaction Social Interaction assist level: Interacts appropriately 90% of the time - Needs monitoring or encouragement for participation or interaction.  Problem Solving Problem solving assist level: Solves basic 90% of the time/requires cueing < 10% of the time  Memory Memory assist level: Recognizes or recalls 75 - 89% of the time/requires cueing 10 - 24% of the time   Stacey Ramirez 12/24/2015, 12:28 PM

## 2015-12-24 NOTE — Plan of Care (Signed)
Problem: RH Balance Goal: LTG Patient will maintain dynamic standing balance (PT) LTG: Patient will maintain dynamic standing balance with assistance during mobility activities (PT)  Outcome: Adequate for Discharge Patient's husband can provide supervision  Problem: RH Bed to Chair Transfers Goal: LTG Patient will perform bed/chair transfers w/assist (PT) LTG: Patient will perform bed/chair transfers with assistance, with/without cues (PT).  Outcome: Adequate for Discharge Patient's husband can provide supervision  Problem: RH Stairs Goal: LTG Patient will ambulate up and down stairs w/assist (PT) LTG: Patient will ambulate up and down # of stairs with assistance (PT)  Outcome: Adequate for Discharge Patient's husband can provide HHA

## 2015-12-24 NOTE — Progress Notes (Signed)
Recreational Therapy Session Note  Patient Details  Name: Stacey Ramirez MRN: [id number] Date of Birth: Nov 29, 1940 Today's Date: 12/24/2015   Met with pt & pt's husband today to discuss TR services and use of leisure time post discharge with potential modifications/adaptations for safe completion.  Both stated understanding.  Pt is anxious to return home, feels like she will do better at home in her own environment.  Informed by team that pt is discharging home tomorrow, full eval deferred.    [place] 12/24/2015, 3:34 PM

## 2015-12-24 NOTE — Progress Notes (Signed)
Physical Therapy Discharge Summary  Patient Details  Name: Stacey Ramirez MRN: [id number] Date of Birth: 08-Nov-1940  Today's Date: 12/24/2015 PT Individual Time: 1300-1430 PT Individual Time Calculation (min): 90 min   Patient has met 8 of 11 long term goals due to improved activity tolerance, improved balance, improved postural control, increased strength, increased range of motion, decreased pain, ability to compensate for deficits, functional use of  left upper extremity and left lower extremity, improved attention, improved awareness and improved coordination.  Patient to discharge at an ambulatory level Supervision.   Patient's care partner is independent to provide the necessary physical and cognitive assistance at discharge.  Reasons goals not met: Patient continues to require intermittent supervision for transfers, supervision for dynamic standing balance, and min A faded to supervision for stairs without railings for UE support. Husband to provide safe and appropriate supervision overall with min A HHA for stair negotiation for home entry.   Recommendation:  Patient will benefit from ongoing skilled PT services in home health setting to continue to advance safe functional mobility, address ongoing impairments in L sided weakness, attention to L, standing balance, activity tolerance, safety awareness, and minimize fall risk.  Equipment: No equipment provided  Reasons for discharge: treatment goals met and discharge from hospital  Patient/family agrees with progress made and goals achieved: Yes  Skilled Therapeutic Intervention Patient at overall supervision level without AD requiring max cues for LLE clearance with increased step length and heel strike, mod cues for forward gaze/upright posture, and max multimodal cues for sequencing simulated car transfer to SUV height. Patient required min A due to impulsivity with initial stair negotiation without rail progressed to supervision with  cues for sequencing, technique, and safety. Patient demonstrates decreased risk of falls from initial evaluation with 5TSS progressed from 25 sec to 14 sec, 10 MWT progressed from 0.33 m/s with HHA to 0.59 m/s without AD, and Berg improved to 44/56 from 33/56 on 12/19/15. Patient instructed in OTAGO HEP x 10-20 each exercise and instructed to use kitchen counter for UE support at home, provided handout and verbalized understanding. Patient's daughter present to observe session and family education completed with husband yesterday. Patient left sitting in room with no further questions regarding discharge home planned tomorrow.   PT Discharge Precautions/Restrictions Restrictions Weight Bearing Restrictions: No Pain Pain Assessment Pain Assessment: No/denies pain Vision/Perception   Defer to OT discharge summary  Cognition Overall Cognitive Status: Impaired/Different from baseline Arousal/Alertness: Awake/alert Orientation Level: Oriented X4 Attention: Sustained;Selective Sustained Attention: Impaired Sustained Attention Impairment: Verbal basic;Functional basic Selective Attention: Impaired Selective Attention Impairment: Verbal basic;Functional basic Memory: Impaired Memory Impairment: Decreased recall of new information;Storage deficit Problem Solving: Impaired Problem Solving Impairment: Functional complex Safety/Judgment: Appears intact Sensation Sensation Light Touch: Appears Intact Stereognosis: Not tested Hot/Cold: Appears Intact Proprioception: Appears Intact Coordination Gross Motor Movements are Fluid and Coordinated: Yes Fine Motor Movements are Fluid and Coordinated: Yes Motor  Motor Motor: Hemiplegia;Abnormal postural alignment and control  Mobility Bed Mobility Bed Mobility: Sit to Supine;Supine to Sit;Rolling Left;Rolling Right Rolling Right: 6: Modified independent (Device/Increase time) Supine to Sit: 6: Modified independent (Device/Increase time) Sit to  Supine: 6: Modified independent (Device/Increase time) Transfers Transfers: Yes Sit to Stand: 5: Supervision Stand to Sit: 5: Supervision Locomotion  Ambulation Ambulation: Yes Ambulation/Gait Assistance: 5: Supervision Ambulation Distance (Feet): 200 Feet Assistive device: None Gait Gait: Yes Gait Pattern: Impaired Gait Pattern: Step-through pattern;Decreased step length - left;Decreased weight shift to left;Trunk flexed;Poor foot clearance - left Gait velocity:  10 MWT = 0.59 m/s Stairs / Additional Locomotion Stairs: Yes Stairs Assistance: 5: Supervision Stair Management Technique: Two rails;Forwards;Alternating pattern Number of Stairs: 12 Height of Stairs: 6 Ramp: 5: Supervision Curb: 5: Supervision Wheelchair Mobility Wheelchair Mobility: No  Trunk/Postural Assessment  Cervical Assessment Cervical Assessment: Within Functional Limits Thoracic Assessment Thoracic Assessment: Exceptions to WFL (slight kyphosis) Lumbar Assessment Lumbar Assessment: Within Functional Limits Postural Control Postural Control: Deficits on evaluation Protective Responses: Delayed, improved from eval  Balance Balance Balance Assessed: Yes Standardized Balance Assessment Standardized Balance Assessment: Berg Balance Test Berg Balance Test Sit to Stand: Able to stand without using hands and stabilize independently Standing Unsupported: Able to stand safely 2 minutes Sitting with Back Unsupported but Feet Supported on Floor or Stool: Able to sit safely and securely 2 minutes Stand to Sit: Sits safely with minimal use of hands Transfers: Able to transfer safely, definite need of hands Standing Unsupported with Eyes Closed: Able to stand 10 seconds with supervision Standing Ubsupported with Feet Together: Able to place feet together independently and stand for 1 minute with supervision From Standing, Reach Forward with Outstretched Arm: Can reach confidently >25 cm (10") From Standing  Position, Pick up Object from Floor: Able to pick up shoe, needs supervision From Standing Position, Turn to Look Behind Over each Shoulder: Looks behind one side only/other side shows less weight shift Turn 360 Degrees: Able to turn 360 degrees safely but slowly Standing Unsupported, Alternately Place Feet on Step/Stool: Able to complete >2 steps/needs minimal assist Standing Unsupported, One Foot in Front: Able to plae foot ahead of the other independently and hold 30 seconds Standing on One Leg: Able to lift leg independently and hold 5-10 seconds Total Score: 44/56 Five times Sit to Stand Test (FTSS) Method: Use a straight back chair with a solid seat that is 16-18" high. Ask participant to sit on the chair with arms folded across their chest.   Instructions: "Stand up and sit down as quickly as possible 5 times, keeping your arms folded across your chest."   Measurement: Stop timing when the participant stands the 5th time.  TIME: __14____ (in seconds)  Times > 13.6 seconds is associated with increased disability and morbidity (Guralnik, 2000) Times > 15 seconds is predictive of recurrent falls in healthy individuals aged 9 and older (Buatois, et al., 2008) Normal performance values in community dwelling individuals aged 59 and older (Bohannon, 2006): o 60-69 years: 11.4 seconds o 70-79 years: 12.6 seconds o 80-89 years: 14.8 seconds  MCID: ? 2.3 seconds for Vestibular Disorders (Meretta, 2006) Extremity Assessment  RUE Assessment RUE Assessment: Within Functional Limits LUE Assessment LUE Assessment: Within Functional Limits RLE Assessment RLE Assessment: Within Functional Limits LLE Strength LLE Overall Strength: Deficits LLE Overall Strength Comments: grossly 5/5 except hip flexion 3+/5   See Function Navigator for Current Functional Status.  Laretta Alstrom 12/24/2015, 4:45 PM

## 2015-12-24 NOTE — Progress Notes (Signed)
[facility]ICINE [facility] REHABILITATION     PROGRESS NOTE    Subjective/Complaints: Had a much better night. Encouraged about her neurological progress as well.   ROS: Pt denies fever, rash/itching, headache, blurred or double vision, nausea, vomiting, abdominal pain, diarrhea, chest pain, shortness of breath, palpitations, dysuria, dizziness, neck or back pain, bleeding, anxiety, or depression   Objective: Vital Signs: Blood pressure 136/80, pulse 80, temperature 97.6 F (36.4 C), temperature source Oral, resp. rate 17, SpO2 100 %. No results found. No results for input(s): WBC, HGB, HCT, PLT in the last 72 hours. No results for input(s): NA, K, CL, GLUCOSE, BUN, CREATININE, CALCIUM in the last 72 hours.  Invalid input(s): CO CBG (last 3)  No results for input(s): GLUCAP in the last 72 hours.  Wt Readings from Last 3 Encounters:  12/17/15 52.164 kg (115 lb)  04/10/15 52.98 kg (116 lb 12.8 oz)  10/04/14 53.252 kg (117 lb 6.4 oz)    Physical Exam:  Head: Normocephalic and atraumatic.  Right Ear: External ear normal.  Left Ear: External ear normal.  Eyes: Conjunctivae are normal. Pupils are equal, round, and reactive to light.  Neck: Normal range of motion. Neck supple. No tracheal deviation present. No thyromegaly present.  Cardiovascular: Normal rate and regular rhythm. Exam reveals no friction rub.  Murmur heard. Respiratory: Effort normal and breath sounds normal. No stridor. No respiratory distress. She has no wheezes. She has no rales.  GI: Bowel sounds are normal. She exhibits no distension. There is no tenderness.  Musculoskeletal: She exhibits no edema or tenderness.  Neurological: She is alert and oriented to person, place, and time.  Mild left facial weakness. Soft voice but no dysarthria. Able to follow one and two step commands without difficulty. Reasonable insight and awareness. Motor:  Right upper extremity: 5/5 proximal distal  RLE: 5/5HF,KE and 4  to 4+ ADF/PF Left upper; 4/5 deltoid, bicep, tricep, wrist, hand--feeding herself with left hand---good dexterity Left lower extremity and 3+HF, 4-KE 4 ADF/PF DTRs symmetric. No sensory deficits Sensation intact light touch  Skin: Skin is warm and dry. No rash noted. No erythema.  Psychiatric: remains anxious but pleasant. Often apologizes for her anxiety    Assessment/Plan: 1. Gait and functional deficits secondary to right basal ganglia infarct which require 3+ hours per day of interdisciplinary therapy in a comprehensive inpatient rehab setting. Physiatrist is providing close team supervision and 24 hour management of active medical problems listed below. Physiatrist and rehab team continue to assess barriers to discharge/monitor patient progress toward functional and medical goals.  Function:  Bathing Bathing position Bathing activity did not occur: Refused Position: Standing at sink (UB bathing only)  Bathing parts Body parts bathed by patient: Right arm, Left arm, Chest, Abdomen Body parts bathed by helper: Right lower leg, Left lower leg, Back  Bathing assist Assist Level: Supervision or verbal cues      Upper Body Dressing/Undressing Upper body dressing Upper body dressing/undressing activity did not occur: Refused What is the patient wearing?: Pull over shirt/dress Bra - Perfomed by patient: Thread/unthread right bra strap, Thread/unthread left bra strap Bra - Perfomed by helper: Hook/unhook bra (pull down sports bra) Pull over shirt/dress - Perfomed by patient: Thread/unthread right sleeve, Thread/unthread left sleeve, Put head through opening, Pull shirt over trunk Pull over shirt/dress - Perfomed by helper: Pull shirt over trunk        Upper body assist Assist Level: Supervision or verbal cues      Lower Body  Dressing/Undressing Lower body dressing Lower body dressing/undressing activity did not occur: N/A What is the patient wearing?: Pants Underwear - Performed  by patient: Thread/unthread right underwear leg, Thread/unthread left underwear leg, Pull underwear up/down Underwear - Performed by helper: Pull underwear up/down Pants- Performed by patient: Thread/unthread right pants leg, Thread/unthread left pants leg, Pull pants up/down, Fasten/unfasten pants   Non-skid slipper socks- Performed by patient: Don/doff right sock, Don/doff left sock                    Lower body assist Assist for lower body dressing: Supervision or verbal cues      Toileting Toileting   Toileting steps completed by patient: Adjust clothing prior to toileting, Performs perineal hygiene, Adjust clothing after toileting Toileting steps completed by helper: Adjust clothing prior to toileting, Adjust clothing after toileting (per Leilani Able, NT) Toileting Assistive Devices: Grab bar or rail  Toileting assist Assist level: Touching or steadying assistance (Pt.75%)   Transfers Chair/bed transfer   Chair/bed transfer method: Ambulatory Chair/bed transfer assist level: Supervision or verbal cues Chair/bed transfer assistive device: Armrests     Locomotion Ambulation     Max distance: 21ft Assist level: Supervision or verbal cues   Wheelchair          Cognition Comprehension Comprehension assist level: Follows basic conversation/direction with extra time/assistive device  Expression Expression assist level: Expresses basic needs/ideas: With extra time/assistive device  Social Interaction Social Interaction assist level: Interacts appropriately 75 - 89% of the time - Needs redirection for appropriate language or to initiate interaction.  Problem Solving Problem solving assist level: Solves basic 90% of the time/requires cueing < 10% of the time  Memory Memory assist level: Recognizes or recalls 75 - 89% of the time/requires cueing 10 - 24% of the time   Medical Problem List and Plan: 1. Gait and functional deficits secondary to right basal ganglia  infarct  -continue CIR therapies---team conference today  -home soon 2. DVT Prophylaxis/Anticoagulation: Pharmaceutical: Lovenox 3. Pain Management:tylenol prn 4. Mood/anxiety: patient has substantial anxiety especially with the sense of "losing control" after the stroke  -pt suffered with baseline anxiety as well  -increased xanax to 0.25mg  with benefit  -neuropsych eval and treat requested  -team to provide ego supportand positive reinforcement as well  -anxiety has been honestly a bigger issue than her stroke  -getting home should help her 5. Neuropsych: This patient is capable of making decisions on her own behalf. 6. Skin/Wound Care: Routine pressure relief measures.  7. Fluids/Electrolytes/Nutrition: I personally reviewed the patient's labs today.   -encourage po  8. Dyslipidemia: On Crestor 20 mg daily -- alternate Praluent if unable to tolerate statin 9. Hypothyroid: On supplement.  10. Aortic stenosis/MR with DOE: Continue Metoprolol bid.  11. Constipation: On Miralax 12. HTN: monitor BP tid--Norvasc and Diovan on hold at this time to allow for adequate perfusion.    -bp's improving---resume diovan as needed    LOS (Days) 7 A FACE TO FACE EVALUATION WAS PERFORMED  SWARTZ,ZACHARY T 12/24/2015 9:15 AM

## 2015-12-24 NOTE — Patient Care Conference (Signed)
Inpatient RehabilitationTeam Conference and Plan of Care Update Date: 12/24/2015   Time: 2:55 PM    Patient Name: Stacey Ramirez      Medical Record Number: [id number]  Date of Birth: Dec 27, 1940 Sex: Female         Room/Bed: 4W23C/4W23C-01 Payor Info: Payor: [employer] / Plan: [employer] / Product Type: *No Product type* /    Admitting Diagnosis: CVA  Admit Date/Time:  12/17/2015  5:03 PM Admission Comments: No comment available   Primary Diagnosis:  Stroke due to embolism of right middle cerebral artery ([place]) Principal Problem: Stroke due to embolism of right middle cerebral artery [facility])  Patient Active Problem List   Diagnosis Date Noted  . Adjustment reaction with anxiety 12/20/2015  . Stroke due to embolism of right middle cerebral artery ([place]) 12/17/2015  . Left hemiparesis ([place])   . Cerebrovascular accident (CVA) due to occlusion of cerebral artery ([place])   . History of CVA with residual deficit   . Prediabetes   . Right Basal ganglia infarction ([place]) 12/14/2015  . Acute CVA (cerebrovascular accident) ([place]) 12/14/2015  . SVT (supraventricular tachycardia) ([place]) 12/14/2015  . Stroke ([place]) 12/13/2015  . Stroke-like symptom 12/13/2015  . History of stroke   . Heart palpitations 09/06/2012  . Hypertension   . Hypercholesterolemia   . Hypothyroidism   . Vitamin D deficiency   . Palpitations   . History of dizziness   . LVH (left ventricular hypertrophy)   . Aortic stenosis   . Mitral regurgitation   . SOB (shortness of breath)   . Difficulty walking     Expected Discharge Date: Expected Discharge Date: 12/25/15  Team Members Present: Physician leading conference: Dr. Alger Simons Social Worker Present: Lennart Pall, LCSW Nurse Present: Heather Roberts, RN PT Present: Carney Living, PT OT Present: Roanna Epley, [place], OT SLP Present: Weston Anna, SLP PPS Coordinator present : Daiva Nakayama, RN, CRRN     Current Status/Progress Goal Weekly  Team Focus  Medical   BG infarct with right HP. anxiety a MAJOR issue. doing better with neuro recovery and with increase in xanax. neuropsych consult requested  finalize medical plan for dc  anxiety, nutrition   Bowel/Bladder   Continent of bowel and bladder. LBM 12/22/15  Pt to remain continent of bowel and bladder  Monitor   Swallow/Nutrition/ Hydration             ADL's   supervision overall; min verbal cues for safety in kitchen  overall supervision  continued family educaiton; activity tolerance, standing balance,    Mobility   supervision  mod I-supervision  L NMR, standing balance, safety, activity tolerance, pt/family education   Communication             Safety/Cognition/ Behavioral Observations  Supervision-Min A  Min A  Family education complete and d/c tomorrow    Pain   Tylenol 69m q 4hrs for occasional headaches  <3  Monitor for nonverbal cues of pain   Skin   CDI  CDI  Assess q shift    Rehab Goals Patient on target to meet rehab goals: Yes *See Care Plan and progress notes for long and short-term goals.  Barriers to Discharge: anixety    Possible Resolutions to Barriers:  anxiolytic medication, ego support, counseling, faimly ed    Discharge Planning/Teaching Needs:  Home with spouse who can provide 24/7 supervision  completed   Team Discussion:  Pt has met all supervision goals and is ready for d/c  home tomorrow.  Revisions to Treatment Plan:  None   Continued Need for Acute Rehabilitation Level of Care: The patient requires daily medical management by a physician with specialized training in physical medicine and rehabilitation for the following conditions: Daily direction of a multidisciplinary physical rehabilitation program to ensure safe treatment while eliciting the highest outcome that is of practical value to the patient.: Yes Daily medical management of patient stability for increased activity during participation in an intensive  rehabilitation regime.: Yes Daily analysis of laboratory values and/or radiology reports with any subsequent need for medication adjustment of medical intervention for : Other;Mood/behavior problems;Neurological problems  Malaina Mortellaro 12/25/2015, 9:16 AM

## 2015-12-24 NOTE — Progress Notes (Signed)
Occupational Therapy Discharge Summary  Patient Details  Name: Stacey Ramirez MRN: [id number] Date of Birth: [birth year]  Patient has met 74 of 11 long term goals due to improved activity tolerance, improved balance, postural control, ability to compensate for deficits, functional use of  RIGHT upper, RIGHT lower, LEFT upper and LEFT lower extremity, improved attention and improved coordination.  Pt made steady progress with BADLs and IADLs during this admission.  Pt's husbnad has been present for all therapy sessions and provides the appropriate level of assistance/supervision.  Pt is overall supervision for all BADLs and simple meal prep.  Patient to discharge at overall Supervision level.  Patient's care partner is independent to provide the necessary physical and cognitive assistance at discharge.      Recommendation:  Patient will benefit from ongoing skilled OT services in outpatient setting to continue to advance functional skills in the area of BADL, iADL and Reduce care partner burden.  Equipment: shower chair  Reasons for discharge: treatment goals met and discharge from hospital  Patient/family agrees with progress made and goals achieved: Yes  OT Discharge ADL ADL ADL Comments: see functional navigator Vision/Perception  Vision- History Baseline Vision/History: Wears glasses Patient Visual Report: No change from baseline Vision- Assessment Vision Assessment?: No apparent visual deficits Ocular Range of Motion: Within Functional Limits Alignment/Gaze Preference: Within Defined Limits  Cognition Overall Cognitive Status: Impaired/Different from baseline Arousal/Alertness: Awake/alert Orientation Level: Oriented X4 Attention: Sustained;Selective Sustained Attention: Impaired Sustained Attention Impairment: Verbal basic;Functional basic Selective Attention: Impaired Selective Attention Impairment: Verbal basic;Functional basic Memory: Impaired Memory Impairment:  Decreased recall of new information;Storage deficit Awareness: Impaired Problem Solving: Impaired Problem Solving Impairment: Functional complex Safety/Judgment: Appears intact Sensation Sensation Light Touch: Appears Intact Stereognosis: Not tested Hot/Cold: Appears Intact Proprioception: Appears Intact Coordination Gross Motor Movements are Fluid and Coordinated: Yes Fine Motor Movements are Fluid and Coordinated: Yes Trunk/Postural Assessment  Cervical Assessment Cervical Assessment: Within Functional Limits Thoracic Assessment Thoracic Assessment: Exceptions to WFL (slight kyphosis) Lumbar Assessment Lumbar Assessment: Within Functional Limits Postural Control Protective Responses: delayed/impaired  Balance   Extremity/Trunk Assessment RUE Assessment RUE Assessment: Within Functional Limits LUE Assessment LUE Assessment: Within Functional Limits   See Function Navigator for Current Functional Status.  Leotis Shames [facility] 12/24/2015, 12:15 PM

## 2015-12-24 NOTE — Progress Notes (Signed)
Occupational Therapy Session Note  Patient Details  Name: Stacey Ramirez MRN: [id number] Date of Birth: 03-Dec-1940  Today's Date: 12/24/2015 OT Individual Time: 1000-1100 OT Individual Time Calculation (min): 60 min    Short Term Goals: Week 1:  OT Short Term Goal 1 (Week 1): Pt will maintain midline during standing while performing functional task with min A with min cues. OT Short Term Goal 2 (Week 1): Pt will demonstrate selective attention during ADL tasks with min cuing OT Short Term Goal 3 (Week 1): Pt will perform toileting with close supervision with VC OT Short Term Goal 4 (Week 1): Pt will perform a basic transfer with close supervision bed to chair  Skilled Therapeutic Interventions/Progress Updates:    Pt engaged in BADL retraining including toileting, bathing at shower level, dressing with sit<>stand from seat, and all functional transfers/ambulation in room.  Pt's husband present and provided all assistance/supervision throughout session.  Pt and husband pleased with discharge home tomorrow and both feel that pt is "ready" to go home.  Recommended shower seat for use at home.    Therapy Documentation Precautions:  Precautions Precautions: Fall Precaution Comments: leans to the left Restrictions Weight Bearing Restrictions: No ADL: ADL ADL Comments: see functional navigator  See Function Navigator for Current Functional Status.   Therapy/Group: Individual Therapy  Leroy Libman 12/24/2015, 12:04 PM

## 2015-12-24 NOTE — Discharge Summary (Signed)
Physician Discharge Summary  Patient ID: Stacey Ramirez MRN: [id number] DOB/AGE: 1940/10/01 75 y.o.  Admit date: 12/17/2015 Discharge date: 12/25/2015  Discharge Diagnoses:  Principal Problem:   Stroke due to embolism of right middle cerebral artery [facility]) Active Problems:   Hypothyroidism   Aortic stenosis   Left hemiparesis (H[facility])   Adjustment reaction with anxiety   Discharged Condition: stable    Labs:  Basic Metabolic Panel: BMP Latest Ref Rng 12/18/2015 12/17/2015 12/15/2015  Glucose 65 - 99 mg/dL 101(H) 105(H) 91  BUN 6 - 20 mg/dL 12 10 12   Creatinine 0.44 - 1.00 mg/dL 0.66 0.79 0.66  Sodium 135 - 145 mmol/L 142 141 143  Potassium 3.5 - 5.1 mmol/L 4.2 4.0 3.5  Chloride 101 - 111 mmol/L 105 104 107  CO2 22 - 32 mmol/L 28 29 27   Calcium 8.9 - 10.3 mg/dL 9.3 9.6 9.3     CBC: CBC Latest Ref Rng 12/18/2015 12/17/2015 12/15/2015  WBC 4.0 - 10.5 K/uL 6.7 5.8 6.0  Hemoglobin 12.0 - 15.0 g/dL 13.7 13.6 13.5  Hematocrit 36.0 - 46.0 % 41.8 41.0 41.6  Platelets 150 - 400 K/uL 226 219 207     CBG: No results for input(s): GLUCAP in the last 168 hours.   Vitals at discharge: Blood pressure 128/61, pulse 68, temperature 98.3 F (36.8 C), temperature source Oral, resp. rate 16, height 5\' 2"  (1.575 m), weight 52.6 kg (115 lb 15.4 oz), SpO2 99 %.   Brief HPI:   Stacey Ramirez is a 75 y.o. female with history of HTN, aortic stenosis, L-CVA 2014 with gait disorder and minimal RLE weakness who was admitted on 12/13/15 with left sided weakness and dysarthric speech. MRI/MRA brain done revealing acute infarct in right basal ganglia with mild progression of PCA atherosclerosis and no large vessel occlusion.  ASA changed to plavix for stroked due to small vessel disease and uncontrolled hyperlipidemia as patient intolerant of statins. Crestor increased to 10 mg daily and low dose BB added due to recurrent SVT.  TEE done 5/16 was negative for thrombus or PFO and Loop recorder placed by Dr.  Rayann Heman.Patient with resultant LLE weakness with narrow BOS, visual deficits in left upper quadrant as well as delayed processing with slow initiation.    Hospital Course: Stacey Ramirez was admitted to rehab 12/17/2015 for inpatient therapies to consist of PT, ST and OT at least three hours five days a week. Past admission physiatrist, therapy team and rehab RN have worked together to provide customized collaborative inpatient rehab. She was maintained on Plavix and Crestor for secondary stroke prevention and is tolerating this without side effects. Blood pressures were monitored on bid basis and continue to be well controlled without medications. Her anxiety levels continue to be elevated and ego support has been provided by team. Xanax has been used at bedtime to help manage issues with adjustment reaction.  Constipation has resolved with use of miralax daily. At discharge, she did report muscle weakness BLE which she has had in the past due to statins. Therefore Crestor was discontinued and she is to follow up with PMD for input on alternative therapy after discharge. She has made steady progress during her rehab stay and is currently at supervision level. She will continue to receive follow up HHPT, [place] and [place] by [place] after discharge.    Rehab course: During patient's stay in rehab weekly team conferences were held to monitor patient's progress, set goals and discuss barriers to discharge.  At admission, she required min to moderate assist with ADL tasks and required min assist with mobility. She has had improvement in activity tolerance, balance, postural control, as well as ability to compensate for deficits. She is has had improvement in functional use LLE as well as improved awareness and decrease in anxiety. She is able to complete ADL task with supervision and occasional cues.  She is able to complete basic and functional tasks with supervision to min assist to help with problem  solving, attention and recall with use of compensatory strategies. Speech is 100% intelligible at the conversation level and she is using compensatory swallow strategies at modified independent level.    Disposition:  Home   Diet: Heart Healthy  Special Instructions: 1. No driving.       Discharge Instructions    Ambulatory referral to Physical Medicine Rehab    Complete by:  As directed   1-2 weeks follow up post stroke/moderate complexity            Medication List    STOP taking these medications        amLODipine 2.5 MG tablet  Commonly known as:  NORVASC     diltiazem 2 % Gel     rosuvastatin 10 MG tablet  Commonly known as:  CRESTOR     trimethoprim 100 MG tablet  Commonly known as:  TRIMPEX     valsartan 320 MG tablet  Commonly known as:  DIOVAN      TAKE these medications        ALPRAZolam 0.25 MG tablet  Commonly known as:  XANAX  Take 1 tablet (0.25 mg total) by mouth at bedtime.     aspirin 325 MG EC tablet  Take 1 tablet (325 mg total) by mouth daily.     carboxymethylcellulose 0.5 % Soln  Commonly known as:  REFRESH PLUS  Place 1 drop into both eyes 3 (three) times daily as needed.     clopidogrel 75 MG tablet  Commonly known as:  PLAVIX  Take 1 tablet (75 mg total) by mouth daily.     estradiol 0.1 MG/GM vaginal cream  Commonly known as:  ESTRACE  Place 2 g vaginally as needed (dryness).     levothyroxine 25 MCG tablet  Commonly known as:  SYNTHROID, LEVOTHROID  Take 25 mcg by mouth daily before breakfast.     LOTEMAX 0.5 % ophthalmic suspension  Generic drug:  loteprednol  Place 1 drop into both eyes 4 (four) times daily as needed. For dry itchy eyes     metoprolol tartrate 25 MG tablet  Commonly known as:  LOPRESSOR  Take 0.5 tablets (12.5 mg total) by mouth 2 (two) times daily.     polyethylene glycol packet  Commonly known as:  MIRALAX / GLYCOLAX  Take 17 g by mouth daily.       Follow-up Information    Follow up with  Horatio Pel, MD On 01/08/2016.   Specialty:  Internal Medicine   Why:  @ 9:45 am (hopital follow up appointment)   Contact information:   [place] [address] [phone]       Follow up with Meredith Staggers, MD.   Specialty:  Physical Medicine and Rehabilitation   Why:  office will call you with follow up appointment. New adress after June 10th-- [address] [place], [place] [address]   Contact information:   [place] [address], [place] [address] [phone]  Follow up with Xu,Jindong, MD. Call today.   Specialty:  Neurology   Why:  for follow up appointment in 4-6 weeks.    Contact information:   [address] [place] [address] [phone]       Follow up with [name], MD On 01/01/2016.   Specialty:  Cardiology   Why:  appointment for wound check at 2 pm   Contact information:   [place] [address] [phone]       Signed: Bary Leriche 12/25/2015, 10:31 AM

## 2015-12-25 DIAGNOSIS — I350 Nonrheumatic aortic (valve) stenosis: Secondary | ICD-10-CM | POA: Diagnosis not present

## 2015-12-25 DIAGNOSIS — G8194 Hemiplegia, unspecified affecting left nondominant side: Secondary | ICD-10-CM | POA: Diagnosis not present

## 2015-12-25 DIAGNOSIS — I63411 Cerebral infarction due to embolism of right middle cerebral artery: Secondary | ICD-10-CM | POA: Diagnosis not present

## 2015-12-25 LAB — URINE CULTURE: CULTURE: NO GROWTH

## 2015-12-25 MED ORDER — ALPRAZOLAM 0.25 MG PO TABS: 0.2500 mg | ORAL_TABLET | Freq: Every day | ORAL | Status: DC

## 2015-12-25 NOTE — Progress Notes (Signed)
Social Work  Discharge Note  The overall goal for the admission was met for:   Discharge location: Yes - home with husband able to provide 24/7 supervision  Length of Stay: Yes - 8 days  Discharge activity level: Yes - supervision/ mod independent  Home/community participation: Yes  Services provided included: MD, RD, PT, OT, SLP, RN, TR, Pharmacy and [place]: Private Insurance: [employer]  Follow-up services arranged: Home Health: PT, OT, ST via [place], DME: tub seat via [place] and Patient/Family has no preference for HH/DME agencies  Comments (or additional information):  Patient/Family verbalized understanding of follow-up arrangements: Yes  Individual responsible for coordination of the follow-up plan: pt  Confirmed correct DME delivered: Laelle Bridgett 12/25/2015    Pascuala Klutts

## 2015-12-25 NOTE — Discharge Instructions (Signed)
Inpatient Rehab Discharge Instructions  Stacey Ramirez Discharge date and time: 12/25/15   Activities/Precautions/ Functional Status: Activity: no lifting, driving, or strenuous exercise  till cleared by MD. Diet: cardiac diet Wound Care: none needed   Functional status:  ___ No restrictions     ___ Walk up steps independently _X__ 24/7 supervision/assistance   ___ Walk up steps with assistance ___ Intermittent supervision/assistance  ___ Bathe/dress independently _X__ Walk with walker    _X__ Bathe/dress with assistance ___ Walk Independently    ___ Shower independently _X__ Walk with supervision.                 ___ Shower with assistance _X__ No alcohol     ___ Return to work/school ________   COMMUNITY REFERRALS UPON DISCHARGE:    Home Health:   PT     OT     ST                      Agency:  [facility] Phone: [phone]   Medical Equipment/Items Ordered:  Tub seat                                                      Agency/Supplier:  [facility] [phone]   GENERAL COMMUNITY RESOURCES FOR PATIENT/FAMILY:  Support Groups:  Stroke Support Group                              2nd Thursday of every month @ 3:00 pm                              Rehab Unit at [facility]:  Benay Pillow, OT @ [phone]    Special Instructions:    STROKE/TIA DISCHARGE INSTRUCTIONS SMOKING Cigarette smoking nearly doubles your risk of having a stroke & is the single most alterable risk factor  If you smoke or have smoked in the last 12 months, you are advised to quit smoking for your health.  Most of the excess cardiovascular risk related to smoking disappears within a year of stopping.  Ask you doctor about anti-smoking medications  [place] Quit Line: 1-800-QUIT NOW  Free Smoking Cessation Classes [phone]  CHOLESTEROL Know your levels; limit fat & cholesterol in your diet  Lipid Panel     Component Value Date/Time   CHOL 315* 12/13/2015 1558     CHOL 331* 04/14/2013 0921   TRIG 59 12/13/2015 1558   HDL 65 12/13/2015 1558   HDL 65 04/14/2013 0921   CHOLHDL 4.8 12/13/2015 1558   CHOLHDL 5.1* 04/14/2013 0921   VLDL 12 12/13/2015 1558   LDLCALC 238* 12/13/2015 1558   LDLCALC 244* 04/14/2013 0921      Many patients benefit from treatment even if their cholesterol is at goal.  Goal: Total Cholesterol (CHOL) less than 160  Goal:  Triglycerides (TRIG) less than 150  Goal:  HDL greater than 40  Goal:  LDL (LDLCALC) less than 100   BLOOD PRESSURE American Stroke Association blood pressure target is less that 120/80 mm/Hg  Your discharge blood pressure is:  BP: 128/61 mmHg  Monitor your blood pressure  Limit your salt and alcohol intake  Many individuals will require more than one medication for high blood pressure  DIABETES (A1c is a blood sugar average for last 3 months) Goal HGBA1c is under 7% (HBGA1c is blood sugar average for last 3 months)  Diabetes: No known diagnosis of diabetes    Lab Results  Component Value Date   HGBA1C 5.7* 12/13/2015     Your HGBA1c can be lowered with medications, healthy diet, and exercise.  Check your blood sugar as directed by your physician  Call your physician if you experience unexplained or low blood sugars.  PHYSICAL ACTIVITY/REHABILITATION Goal is 30 minutes at least 4 days per week  Activity: No driving, Therapies: See above Return to work: N/A  Activity decreases your risk of heart attack and stroke and makes your heart stronger.  It helps control your weight and blood pressure; helps you relax and can improve your mood.  Participate in a regular exercise program.  Talk with your doctor about the best form of exercise for you (dancing, walking, swimming, cycling).  DIET/WEIGHT Goal is to maintain a healthy weight  Your discharge diet is: Diet Heart Room service appropriate?: Yes; Fluid consistency:: Thin  liquids Your height is:  Height: 5\' 2"  (157.5 cm) Your current  weight is: Weight: 52.6 kg (115 lb 15.4 oz) Your Body Mass Index (BMI) is:  BMI (Calculated): 21.3  Following the type of diet specifically designed for you will help prevent another stroke.  You are at goal weight  Your goal Body Mass Index (BMI) is 19-24.  Healthy food habits can help reduce 3 risk factors for stroke:  High cholesterol, hypertension, and excess weight.  RESOURCES Stroke/Support Group:  Call [phone]   STROKE EDUCATION PROVIDED/REVIEWED AND GIVEN TO PATIENT Stroke warning signs and symptoms How to activate emergency medical system (call 911). Medications prescribed at discharge. Need for follow-up after discharge. Personal risk factors for stroke. Pneumonia vaccine given:  Flu vaccine given:  My questions have been answered, the writing is legible, and I understand these instructions.  I will adhere to these goals & educational materials that have been provided to me after my discharge from the hospital.      My questions have been answered and I understand these instructions. I will adhere to these goals and the provided educational materials after my discharge from the hospital.  Patient/Caregiver Signature _______________________________ Date __________  Clinician Signature _______________________________________ Date __________  Please bring this form and your medication list with you to all your follow-up doctor's appointments.

## 2015-12-25 NOTE — Progress Notes (Signed)
[facility] PHYSICAL MEDICINE & REHABILITATION     PROGRESS NOTE    Subjective/Complaints: Had another good night.  Anxious to get home. Feels that crestor is causing leg weakness. Has had problems with zocor in the past.   ROS: Pt denies fever, rash/itching, headache, blurred or double vision, nausea, vomiting, abdominal pain, diarrhea, chest pain, shortness of breath, palpitations, dysuria, dizziness, neck or back pain, bleeding, anxiety, or depression   Objective: Vital Signs: Blood pressure 128/61, pulse 68, temperature 98.3 F (36.8 C), temperature source Oral, resp. rate 16, height 5' 2"  (1.575 m), weight 52.6 kg (115 lb 15.4 oz), SpO2 99 %. No results found. No results for input(s): WBC, HGB, HCT, PLT in the last 72 hours. No results for input(s): NA, K, CL, GLUCOSE, BUN, CREATININE, CALCIUM in the last 72 hours.  Invalid input(s): CO CBG (last 3)  No results for input(s): GLUCAP in the last 72 hours.  Wt Readings from Last 3 Encounters:  12/25/15 52.6 kg (115 lb 15.4 oz)  12/17/15 52.164 kg (115 lb)  04/10/15 52.98 kg (116 lb 12.8 oz)    Physical Exam:  Head: Normocephalic and atraumatic.  Right Ear: External ear normal.  Left Ear: External ear normal.  Eyes: Conjunctivae are normal. Pupils are equal, round, and reactive to light.  Neck: Normal range of motion. Neck supple. No tracheal deviation present. No thyromegaly present.  Cardiovascular: Normal rate and regular rhythm. Exam reveals no friction rub.  Murmur heard. Respiratory: Effort normal and breath sounds normal. No stridor. No respiratory distress. She has no wheezes. She has no rales.  GI: Bowel sounds are normal. She exhibits no distension. There is no tenderness.  Musculoskeletal: She exhibits no edema or tenderness.  Neurological: She is alert and oriented to person, place, and time.  Mild left facial weakness. Soft voice but no dysarthria. Able to follow one and two step commands without difficulty.  Reasonable insight and awareness. Motor:  Right upper extremity: 5/5 proximal distal  RLE: 5/5HF,KE and 4 to 4+ ADF/PF Left upper; 4/5 deltoid, bicep, tricep, wrist, hand--feeding herself with left hand---good dexterity Left lower extremity and 3+HF, 4-KE 4 ADF/PF DTRs symmetric. No sensory deficits Sensation intact light touch  Skin: Skin is warm and dry. No rash noted. No erythema.  Psychiatric: remains anxious but pleasant. Often apologizes for her anxiety    Assessment/Plan: 1. Gait and functional deficits secondary to right basal ganglia infarct which require 3+ hours per day of interdisciplinary therapy in a comprehensive inpatient rehab setting. Physiatrist is providing close team supervision and 24 hour management of active medical problems listed below. Physiatrist and rehab team continue to assess barriers to discharge/monitor patient progress toward functional and medical goals.  Function:  Bathing Bathing position Bathing activity did not occur: Refused Position: Production manager parts bathed by patient: Right arm, Left arm, Chest, Abdomen, Front perineal area, Buttocks, Left upper leg, Right lower leg, Left lower leg, Back Body parts bathed by helper: Right lower leg, Left lower leg, Back  Bathing assist Assist Level: Supervision or verbal cues      Upper Body Dressing/Undressing Upper body dressing Upper body dressing/undressing activity did not occur: Refused What is the patient wearing?: Pull over shirt/dress Bra - Perfomed by patient: Thread/unthread right bra strap, Thread/unthread left bra strap Bra - Perfomed by helper: Hook/unhook bra (pull down sports bra) Pull over shirt/dress - Perfomed by patient: Thread/unthread right sleeve, Thread/unthread left sleeve, Put head through opening, Pull shirt over trunk Pull  over shirt/dress - Perfomed by helper: Pull shirt over trunk        Upper body assist Assist Level: Supervision or verbal cues       Lower Body Dressing/Undressing Lower body dressing Lower body dressing/undressing activity did not occur: N/A What is the patient wearing?: Underwear, Pants, Socks, Shoes Underwear - Performed by patient: Thread/unthread right underwear leg, Thread/unthread left underwear leg, Pull underwear up/down Underwear - Performed by helper: Pull underwear up/down Pants- Performed by patient: Thread/unthread right pants leg, Thread/unthread left pants leg, Pull pants up/down, Fasten/unfasten pants   Non-skid slipper socks- Performed by patient: Don/doff right sock, Don/doff left sock   Socks - Performed by patient: Don/doff right sock, Don/doff left sock   Shoes - Performed by patient: Don/doff right shoe, Don/doff left shoe            Lower body assist Assist for lower body dressing: Supervision or verbal cues      Toileting Toileting   Toileting steps completed by patient: Adjust clothing prior to toileting, Performs perineal hygiene, Adjust clothing after toileting Toileting steps completed by helper: Adjust clothing prior to toileting, Adjust clothing after toileting (per Leilani Able, NT) Toileting Assistive Devices: Grab bar or rail  Toileting assist Assist level: Supervision or verbal cues   Transfers Chair/bed transfer   Chair/bed transfer method: Ambulatory Chair/bed transfer assist level: Supervision or verbal cues Chair/bed transfer assistive device: Armrests     Locomotion Ambulation     Max distance: 232f Assist level: Supervision or verbal cues   Wheelchair          Cognition Comprehension Comprehension assist level: Follows basic conversation/direction with extra time/assistive device  Expression Expression assist level: Expresses basic needs/ideas: With extra time/assistive device  Social Interaction Social Interaction assist level: Interacts appropriately 75 - 89% of the time - Needs redirection for appropriate language or to initiate interaction.  Problem  Solving Problem solving assist level: Solves basic 90% of the time/requires cueing < 10% of the time  Memory Memory assist level: Recognizes or recalls 75 - 89% of the time/requires cueing 10 - 24% of the time   Medical Problem List and Plan: 1. Gait and functional deficits secondary to right basal ganglia infarct  -home today. Goals met  Patient to see me in the office for transitional care encounter in 1-2 weeks. 2. DVT Prophylaxis/Anticoagulation: Pharmaceutical: Lovenox 3. Pain Management:tylenol prn 4. Mood/anxiety: patient has substantial anxiety especially with the sense of "losing control" after the stroke  -pt suffered with baseline anxiety as well  -continue xanax   0.25mbid---taper/change to PRN once home depending upon symptoms   -getting home should help her 5. Neuropsych: This patient is capable of making decisions on her own behalf. 6. Skin/Wound Care: Routine pressure relief measures.  7. Fluids/Electrolytes/Nutrition:-discussed the importance of a balanced diet/appropriate nutrition 8. Dyslipidemia: On Crestor 20 mg daily---hold for now until follow up with me or primary  -consider praluent as outpt 9. Hypothyroid: On supplement.  10. Aortic stenosis/MR with DOE: Continue Metoprolol bid.  11. Constipation: On Miralax 12. HTN: monitor BP tid--Norvasc and Diovan on hold at this time to allow for adequate perfusion.    -bp's improving---resume diovan as needed    LOS (Days) 8 A FACE TO FACE EVALUATION WAS PERFORMED  Khadir Roam T 12/25/2015 9:18 AM

## 2015-12-25 NOTE — Progress Notes (Signed)
Patient discharged to home at [phone] with husband with all belongings. Patient and husband received discharge instructions from P. Love, PA. Patient and husband verbalized understanding of discharge instructions.  Stacey Ramirez

## 2015-12-26 ENCOUNTER — Telehealth: Payer: Self-pay

## 2015-12-26 NOTE — Telephone Encounter (Signed)
Transitional Care Questions   1. Are you/is patient experiencing any problems since coming home? Having some tiredness. Also states that when she gets out of bed, she gets anxious with herself because she does not move quickly. Otherwise, doing well.   Are there any questions regarding any aspect of care? Ask about taking a multivitamin. Patient advised that multivitamin would be okay to take.   2. Are there any questions regarding medications administration/dosing? Not at this time.  Are meds being taken as prescribed? Medications are being taken as prescribed. Patient should review meds with caller to confirm. All medications were reviewed with patient verbally.   3. Have there been any falls? No Falls.   4. Has Home Health been to the house and/or have they contacted you? Not yet. Patient states that Husband will call tomorrow.  If not, have you tried to contact them? Can we help you contact them?   5. Are bowels and bladder emptying properly? No bowel or bladder issues.  Are there any unexpected incontinence issues? No issues.  If applicable, is patient following bowel/bladder programs?   6. Any fevers, problems with breathing, unexpected pain? No fevers, problems with breathing, or unexpected pain.   7. Are there any skin problems or new areas of breakdown? No skin issues at this time.   8. Has the patient/family member arranged specialty MD follow up (ie cardiology/neurology/renal/surgical/etc)? Can we help arrange? Yes. Patient states that Husband is working on this.    9. Does the patient need any other services or support that we can help arrange? No services at this time.   10. Are caregivers following through as expected in assisting the patient? Yes, Patient husband is helping with her.   11. Has the patient quit smoking, drinking alcohol, or using drugs as recommended? Patient does not drink, smoke tobacco or use Illicit Drugs.  Patient and Patient's husband advised of  appointment with Dr. Delice Lesch on Friday June 2nd, 2017 @11 :30am. Patient verbalized appointment and patient packet has been mailed.

## 2015-12-27 ENCOUNTER — Observation Stay (HOSPITAL_COMMUNITY)
Admission: EM | Admit: 2015-12-27 | Discharge: 2015-12-29 | Disposition: A | Discharge status: Home Health | Payer: PPO | Attending: Family Medicine | Admitting: Family Medicine

## 2015-12-27 ENCOUNTER — Encounter (HOSPITAL_COMMUNITY): Payer: Self-pay | Admitting: Emergency Medicine

## 2015-12-27 DIAGNOSIS — E039 Hypothyroidism, unspecified: Secondary | ICD-10-CM | POA: Diagnosis not present

## 2015-12-27 DIAGNOSIS — I6930 Unspecified sequelae of cerebral infarction: Secondary | ICD-10-CM

## 2015-12-27 DIAGNOSIS — Z79899 Other long term (current) drug therapy: Secondary | ICD-10-CM | POA: Diagnosis not present

## 2015-12-27 DIAGNOSIS — I10 Essential (primary) hypertension: Secondary | ICD-10-CM | POA: Diagnosis not present

## 2015-12-27 DIAGNOSIS — Z7902 Long term (current) use of antithrombotics/antiplatelets: Secondary | ICD-10-CM | POA: Insufficient documentation

## 2015-12-27 DIAGNOSIS — R4781 Slurred speech: Secondary | ICD-10-CM | POA: Insufficient documentation

## 2015-12-27 DIAGNOSIS — I080 Rheumatic disorders of both mitral and aortic valves: Secondary | ICD-10-CM | POA: Diagnosis not present

## 2015-12-27 DIAGNOSIS — R531 Weakness: Secondary | ICD-10-CM | POA: Diagnosis not present

## 2015-12-27 DIAGNOSIS — Z7982 Long term (current) use of aspirin: Secondary | ICD-10-CM | POA: Insufficient documentation

## 2015-12-27 DIAGNOSIS — R51 Headache: Secondary | ICD-10-CM | POA: Insufficient documentation

## 2015-12-27 DIAGNOSIS — R262 Difficulty in walking, not elsewhere classified: Secondary | ICD-10-CM | POA: Insufficient documentation

## 2015-12-27 DIAGNOSIS — I69354 Hemiplegia and hemiparesis following cerebral infarction affecting left non-dominant side: Secondary | ICD-10-CM | POA: Insufficient documentation

## 2015-12-27 DIAGNOSIS — E785 Hyperlipidemia, unspecified: Secondary | ICD-10-CM | POA: Insufficient documentation

## 2015-12-27 DIAGNOSIS — R519 Headache, unspecified: Secondary | ICD-10-CM | POA: Diagnosis present

## 2015-12-27 DIAGNOSIS — F419 Anxiety disorder, unspecified: Secondary | ICD-10-CM | POA: Diagnosis not present

## 2015-12-27 DIAGNOSIS — I350 Nonrheumatic aortic (valve) stenosis: Secondary | ICD-10-CM | POA: Diagnosis not present

## 2015-12-27 DIAGNOSIS — I69328 Other speech and language deficits following cerebral infarction: Secondary | ICD-10-CM | POA: Diagnosis not present

## 2015-12-27 DIAGNOSIS — Z7901 Long term (current) use of anticoagulants: Secondary | ICD-10-CM | POA: Diagnosis not present

## 2015-12-27 LAB — PROTIME-INR
INR: 0.95 (ref 0.00–1.49)
PROTHROMBIN TIME: 12.9 s (ref 11.6–15.2)

## 2015-12-27 LAB — CBC
HEMATOCRIT: 42.8 % (ref 36.0–46.0)
HEMOGLOBIN: 14.3 g/dL (ref 12.0–15.0)
MCH: 31.1 pg (ref 26.0–34.0)
MCHC: 33.4 g/dL (ref 30.0–36.0)
MCV: 93 fL (ref 78.0–100.0)
PLATELETS: 239 10*3/uL (ref 150–400)
RBC: 4.6 MIL/uL (ref 3.87–5.11)
RDW: 12.4 % (ref 11.5–15.5)
WBC: 8 10*3/uL (ref 4.0–10.5)

## 2015-12-27 LAB — COMPREHENSIVE METABOLIC PANEL
ALK PHOS: 58 U/L (ref 38–126)
ALT: 49 U/L (ref 14–54)
ANION GAP: 8 (ref 5–15)
AST: 34 U/L (ref 15–41)
Albumin: 4.2 g/dL (ref 3.5–5.0)
BUN: 9 mg/dL (ref 6–20)
CALCIUM: 9.8 mg/dL (ref 8.9–10.3)
CO2: 28 mmol/L (ref 22–32)
Chloride: 105 mmol/L (ref 101–111)
Creatinine, Ser: 0.67 mg/dL (ref 0.44–1.00)
Glucose, Bld: 107 mg/dL — ABNORMAL HIGH (ref 65–99)
Potassium: 3.5 mmol/L (ref 3.5–5.1)
SODIUM: 141 mmol/L (ref 135–145)
TOTAL PROTEIN: 7.4 g/dL (ref 6.5–8.1)
Total Bilirubin: 0.6 mg/dL (ref 0.3–1.2)

## 2015-12-27 LAB — I-STAT CHEM 8, ED
BUN: 12 mg/dL (ref 6–20)
CALCIUM ION: 1.2 mmol/L (ref 1.13–1.30)
CHLORIDE: 103 mmol/L (ref 101–111)
CREATININE: 0.7 mg/dL (ref 0.44–1.00)
GLUCOSE: 100 mg/dL — AB (ref 65–99)
HCT: 45 % (ref 36.0–46.0)
Hemoglobin: 15.3 g/dL — ABNORMAL HIGH (ref 12.0–15.0)
Potassium: 3.6 mmol/L (ref 3.5–5.1)
Sodium: 144 mmol/L (ref 135–145)
TCO2: 28 mmol/L (ref 0–100)

## 2015-12-27 LAB — I-STAT TROPONIN, ED: TROPONIN I, POC: 0.01 ng/mL (ref 0.00–0.08)

## 2015-12-27 LAB — APTT: aPTT: 25 seconds (ref 24–37)

## 2015-12-27 LAB — DIFFERENTIAL
Basophils Absolute: 0.1 10*3/uL (ref 0.0–0.1)
Basophils Relative: 1 %
EOS PCT: 2 %
Eosinophils Absolute: 0.2 10*3/uL (ref 0.0–0.7)
LYMPHS PCT: 27 %
Lymphs Abs: 2.2 10*3/uL (ref 0.7–4.0)
MONO ABS: 0.7 10*3/uL (ref 0.1–1.0)
MONOS PCT: 8 %
Neutro Abs: 5 10*3/uL (ref 1.7–7.7)
Neutrophils Relative %: 62 %

## 2015-12-27 MED ORDER — LABETALOL HCL 5 MG/ML IV SOLN
20.0000 mg | INTRAVENOUS | Status: DC | PRN
  Administered 2015-12-27: 20 mg via INTRAVENOUS
  Filled 2015-12-27: qty 4

## 2015-12-27 NOTE — ED Notes (Signed)
Pt here with hypertension. Pt has home health nurse that told her that her BP waws 200/100. Pt denies blurred vision, HA, numbness. Pt has hx of previous stroke "a couple weeks ago" with left sided weakness that has been resolving.

## 2015-12-27 NOTE — ED Provider Notes (Signed)
Medical screening examination/treatment/procedure(s) were conducted as a shared visit with non-physician practitioner(s) and myself.  I personally evaluated the patient during the encounter.  75 yo F w/ recent admission and discharge for stroke here with headache, HTN and worsening left foot drag (per husband report). Exam with mild left sided arm and leg weakness. Left facial droop as well. BP as high as 209/157. No other PE abnormalities.  Concern for HTN emergency, plan to give a dose of Labetalol, reassess, likely will need admission for BP control. Needs CT to ensure no hemmorrhagic conversion.   EKG Interpretation None        Merrily Pew, MD 12/28/15 [phone]

## 2015-12-27 NOTE — ED Provider Notes (Signed)
CSN: [id number]     Arrival date & time 12/27/15  2026 History   First MD Initiated Contact with Patient 12/27/15 2211     Chief Complaint  Patient presents with  . Hypertension     (Consider location/radiation/quality/duration/timing/severity/associated sxs/prior Treatment) HPI Comments: Patient presents to the emergency department with chief complaint of hypertension. She states that she had a recent stroke, and was recently discharged from the hospital. She has some residual left-sided weakness. She states that she was going through physical therapy today, and felt very fatigued. She states that she has had a mild persistent headaches since having a stroke, but states that this cut a little bit worse today. She also reports having some recent difficulty with moving her left lower extremity. This is different from the past few days. Her husband also comments that it seems like she has been "dragging her left foot more than normal." She has taken her regular blood pressure medications today with no relief. There are no modifying factors.  The history is provided by the patient. No language interpreter was used.    Past Medical History  Diagnosis Date  . Hypertension     LIABLE  . Hypercholesterolemia   . Hypothyroidism   . Vitamin D deficiency   . Palpitations   . History of dizziness   . LVH (left ventricular hypertrophy)   . Aortic stenosis   . Mitral regurgitation   . SOB (shortness of breath)   . Difficulty walking   . Cerebrovascular accident (stroke) [facility])    Past Surgical History  Procedure Laterality Date  . Cardiovascular stress test  2002    NORMAL  . Transthoracic echocardiogram  2008    SHOWED LEFT VENTRICULAR HYPERTROPHY AND MILD AORTIC STENOSIS  . Ep implantable device N/A 12/17/2015    Procedure: Loop Recorder Insertion;  Surgeon: [name], MD;  Location: [facility];  Service: Cardiovascular;  Laterality: N/A;  . Tee without cardioversion N/A  12/17/2015    Procedure: TRANSESOPHAGEAL ECHOCARDIOGRAM (TEE);  Surgeon: Lelon Perla, MD;  Location: [facility] ENDOSCOPY;  Service: Cardiovascular;  Laterality: N/A;  Pt also needs a LOOP   Family History  Problem Relation Age of Onset  . Heart attack Mother   . Alzheimer's disease Mother    Social History  Substance Use Topics  . Smoking status: Never Smoker   . Smokeless tobacco: None  . Alcohol Use: Yes   OB History    No data available     Review of Systems  Constitutional: Negative for fever and chills.  Respiratory: Negative for shortness of breath.   Cardiovascular: Negative for chest pain.  Gastrointestinal: Negative for nausea, vomiting, diarrhea and constipation.  Genitourinary: Negative for dysuria.  Neurological: Positive for weakness and headaches.  All other systems reviewed and are negative.     Allergies  Sulfa drugs cross reactors; Zocor; and Micardis  Home Medications   Prior to Admission medications   Medication Sig Start Date End Date Taking? Authorizing Provider  ALPRAZolam (XANAX) 0.25 MG tablet Take 1 tablet (0.25 mg total) by mouth at bedtime. 12/25/15  Yes Ivan Anchors Love, PA-C  aspirin EC 325 MG EC tablet Take 1 tablet (325 mg total) by mouth daily. 12/17/15  Yes Lavina Hamman, MD  B Complex-C-Folic Acid (STRESS FORMULA) TABS Take 1 tablet by mouth daily.    Yes Historical Provider, MD  carboxymethylcellulose (REFRESH PLUS) 0.5 % SOLN Place 1 drop into both eyes 3 (three) times daily as needed.  Yes Historical Provider, MD  clopidogrel (PLAVIX) 75 MG tablet Take 1 tablet (75 mg total) by mouth daily. 12/24/15  Yes Ivan Anchors Love, PA-C  estradiol (ESTRACE) 0.1 MG/GM vaginal cream Place 2 g vaginally as needed (dryness).    Yes Historical Provider, MD  levothyroxine (SYNTHROID, LEVOTHROID) 25 MCG tablet Take 25 mcg by mouth daily before breakfast.  09/21/14  Yes Historical Provider, MD  LOTEMAX 0.5 % ophthalmic suspension Place 1 drop into both eyes 4  (four) times daily as needed. For dry itchy eyes 02/17/13  Yes Historical Provider, MD  metoprolol tartrate (LOPRESSOR) 25 MG tablet Take 0.5 tablets (12.5 mg total) by mouth 2 (two) times daily. 12/24/15  Yes Ivan Anchors Love, PA-C  polyethylene glycol (MIRALAX / GLYCOLAX) packet Take 17 g by mouth daily. Patient taking differently: Take 17 g by mouth daily as needed for moderate constipation.  12/17/15  Yes Lavina Hamman, MD  amLODipine (NORVASC) 2.5 MG tablet Take 2.5 mg by mouth daily. Reported on 12/27/2015 12/12/15   Historical Provider, MD  Multiple Vitamin (MULTIVITAMIN WITH MINERALS) TABS tablet Take 1 tablet by mouth daily.    Historical Provider, MD  valsartan (DIOVAN) 320 MG tablet Take 1 tablet by mouth daily. Reported on 12/27/2015 11/25/15   Historical Provider, MD   BP 174/96 mmHg  Pulse 76  Temp(Src) 98.6 F (37 C) (Oral)  Resp 18  Wt 51.342 kg  SpO2 95% Physical Exam  Constitutional: She is oriented to person, place, and time. She appears well-developed and well-nourished.  HENT:  Head: Normocephalic and atraumatic.  Eyes: Conjunctivae and EOM are normal. Pupils are equal, round, and reactive to light.  Neck: Normal range of motion. Neck supple.  Cardiovascular: Normal rate and regular rhythm.  Exam reveals no gallop and no friction rub.   No murmur heard. Pulmonary/Chest: Effort normal and breath sounds normal. No respiratory distress. She has no wheezes. She has no rales. She exhibits no tenderness.  Abdominal: Soft. Bowel sounds are normal. She exhibits no distension and no mass. There is no tenderness. There is no rebound and no guarding.  Musculoskeletal: Normal range of motion. She exhibits no edema or tenderness.  Neurological: She is alert and oriented to person, place, and time.  CN 3-12 intact, speech is clear, movements are goal oriented Left grip strength is reduced, otherwise ROM and strength 5/5 throughout.  Skin: Skin is warm and dry.  Psychiatric: She has a  normal mood and affect. Her behavior is normal. Judgment and thought content normal.  Nursing note and vitals reviewed.   ED Course  Procedures (including critical care time) Labs Review Labs Reviewed  PROTIME-INR  APTT  CBC  DIFFERENTIAL  COMPREHENSIVE METABOLIC PANEL  I-STAT CHEM 8, ED  I-STAT TROPOININ, ED    Imaging Review No results found. I have personally reviewed and evaluated these images and lab results as part of my medical decision-making.   EKG Interpretation None      MDM   Final diagnoses:  None    Patient with hypertensive episode.  Concern over worsening headache, decline in LLE recovery from recent stroke.  Patient seen by and discussed with Dr. Dayna Barker.  Will decrease MAP by 25%.  Will get repeat CT and labs.  If headache, HTN, and weakness do not resolve, patient will need to be admitted.  Patient signed out to [place], [place].    Plan:  Follow-up on CT.  If CT is normal, BP stabilized, headache gone, and ambulates per baseline  then DC to home.  Otherwise, admit for obs.  Also discussed patient, plan, and workup with Dr. Kathrynn Humble.    Montine Circle, PA-C 12/28/15 [id number]  Merrily Pew, MD 12/28/15 [id number]  Merrily Pew, MD 12/28/15 [phone]

## 2015-12-28 ENCOUNTER — Emergency Department (HOSPITAL_COMMUNITY): Payer: PPO

## 2015-12-28 DIAGNOSIS — R51 Headache: Secondary | ICD-10-CM

## 2015-12-28 DIAGNOSIS — I6930 Unspecified sequelae of cerebral infarction: Secondary | ICD-10-CM

## 2015-12-28 DIAGNOSIS — M6289 Other specified disorders of muscle: Secondary | ICD-10-CM

## 2015-12-28 DIAGNOSIS — I10 Essential (primary) hypertension: Secondary | ICD-10-CM | POA: Diagnosis present

## 2015-12-28 DIAGNOSIS — E039 Hypothyroidism, unspecified: Secondary | ICD-10-CM

## 2015-12-28 DIAGNOSIS — R519 Headache, unspecified: Secondary | ICD-10-CM | POA: Diagnosis present

## 2015-12-28 DIAGNOSIS — R531 Weakness: Secondary | ICD-10-CM | POA: Diagnosis present

## 2015-12-28 LAB — URINALYSIS, ROUTINE W REFLEX MICROSCOPIC
Bilirubin Urine: NEGATIVE
Glucose, UA: NEGATIVE mg/dL
Hgb urine dipstick: NEGATIVE
Ketones, ur: NEGATIVE mg/dL
LEUKOCYTES UA: NEGATIVE
NITRITE: NEGATIVE
PH: 6.5 (ref 5.0–8.0)
Protein, ur: NEGATIVE mg/dL
SPECIFIC GRAVITY, URINE: 1.012 (ref 1.005–1.030)

## 2015-12-28 LAB — MAGNESIUM: Magnesium: 2 mg/dL (ref 1.7–2.4)

## 2015-12-28 LAB — RAPID URINE DRUG SCREEN, HOSP PERFORMED
AMPHETAMINES: NOT DETECTED
BENZODIAZEPINES: NOT DETECTED
Barbiturates: NOT DETECTED
COCAINE: NOT DETECTED
OPIATES: NOT DETECTED
TETRAHYDROCANNABINOL: NOT DETECTED

## 2015-12-28 MED ORDER — ALPRAZOLAM 0.25 MG PO TABS
0.2500 mg | ORAL_TABLET | Freq: Every day | ORAL | Status: DC
  Administered 2015-12-28: 0.25 mg via ORAL
  Filled 2015-12-28: qty 1

## 2015-12-28 MED ORDER — LABETALOL HCL 5 MG/ML IV SOLN: 10.0000 mg | INTRAVENOUS | Status: DC | PRN

## 2015-12-28 MED ORDER — STROKE: EARLY STAGES OF RECOVERY BOOK
Freq: Once | Status: DC
  Filled 2015-12-28: qty 1

## 2015-12-28 MED ORDER — LEVOTHYROXINE SODIUM 25 MCG PO TABS
25.0000 ug | ORAL_TABLET | Freq: Every day | ORAL | Status: DC
  Administered 2015-12-28 – 2015-12-29 (×2): 25 ug via ORAL
  Filled 2015-12-28 (×2): qty 1

## 2015-12-28 MED ORDER — POLYVINYL ALCOHOL 1.4 % OP SOLN: 1.0000 [drp] | OPHTHALMIC | Status: DC | PRN

## 2015-12-28 MED ORDER — AMLODIPINE BESYLATE 5 MG PO TABS: 2.5000 mg | ORAL_TABLET | Freq: Every day | ORAL | Status: DC

## 2015-12-28 MED ORDER — IRBESARTAN 300 MG PO TABS
300.0000 mg | ORAL_TABLET | Freq: Every day | ORAL | Status: DC
  Administered 2015-12-28 – 2015-12-29 (×2): 300 mg via ORAL
  Filled 2015-12-28 (×2): qty 1

## 2015-12-28 MED ORDER — CARBOXYMETHYLCELLULOSE SODIUM 0.5 % OP SOLN: 1.0000 [drp] | Freq: Three times a day (TID) | OPHTHALMIC | Status: DC | PRN

## 2015-12-28 MED ORDER — METOPROLOL TARTRATE 12.5 MG HALF TABLET
12.5000 mg | ORAL_TABLET | Freq: Two times a day (BID) | ORAL | Status: DC
  Administered 2015-12-28 – 2015-12-29 (×3): 12.5 mg via ORAL
  Filled 2015-12-28 (×3): qty 1

## 2015-12-28 MED ORDER — POLYETHYLENE GLYCOL 3350 17 G PO PACK
17.0000 g | PACK | Freq: Every day | ORAL | Status: DC | PRN
  Filled 2015-12-28: qty 1

## 2015-12-28 MED ORDER — ACETAMINOPHEN 325 MG PO TABS: 650.0000 mg | ORAL_TABLET | Freq: Four times a day (QID) | ORAL | Status: DC | PRN

## 2015-12-28 MED ORDER — CLOPIDOGREL BISULFATE 75 MG PO TABS
75.0000 mg | ORAL_TABLET | Freq: Every day | ORAL | Status: DC
  Administered 2015-12-28 – 2015-12-29 (×2): 75 mg via ORAL
  Filled 2015-12-28 (×2): qty 1

## 2015-12-28 MED ORDER — ENOXAPARIN SODIUM 40 MG/0.4ML ~~LOC~~ SOLN
40.0000 mg | Freq: Every day | SUBCUTANEOUS | Status: DC
  Administered 2015-12-28 – 2015-12-29 (×2): 40 mg via SUBCUTANEOUS
  Filled 2015-12-28 (×2): qty 0.4

## 2015-12-28 MED ORDER — LOTEPREDNOL ETABONATE 0.5 % OP SUSP
1.0000 [drp] | Freq: Three times a day (TID) | OPHTHALMIC | Status: DC
  Filled 2015-12-28: qty 5

## 2015-12-28 MED ORDER — SENNOSIDES-DOCUSATE SODIUM 8.6-50 MG PO TABS: 1.0000 | ORAL_TABLET | Freq: Every evening | ORAL | Status: DC | PRN

## 2015-12-28 MED ORDER — RENA-VITE PO TABS
1.0000 | ORAL_TABLET | Freq: Every day | ORAL | Status: DC
  Administered 2015-12-28 – 2015-12-29 (×2): 1 via ORAL
  Filled 2015-12-28 (×2): qty 1

## 2015-12-28 MED ORDER — ASPIRIN EC 325 MG PO TBEC
325.0000 mg | DELAYED_RELEASE_TABLET | Freq: Every day | ORAL | Status: DC
  Administered 2015-12-28 – 2015-12-29 (×2): 325 mg via ORAL
  Filled 2015-12-28 (×2): qty 1

## 2015-12-28 NOTE — Progress Notes (Signed)
Pt admitted  through [facility] ED from home with Spouse with  H TN and lefft side weakness  BP on admission was  233/117.Marland Kitchen Pt alert and oriented x 4 neuro intact with left side weakness . Initial assessment done with vs. Pt pass swallow eval done at ed. Q 2 vs / neuro check until 1820. Care endorsed to on  comming RN.

## 2015-12-28 NOTE — H&P (Signed)
History and Physical    Stacey Ramirez [id number] DOB: [birth year] DOA: 12/27/2015  Referring MD/NP/PA: Arlean Hopping, PA PCP: Horatio Pel, MD  Patient coming from: Home  Chief Complaint: High blood pressure and headache  HPI: Stacey Ramirez is a 75 y.o. female with medical history significant of HTN, HLD,  aortic stenosis, L-CVA 2014 with gait disorder and minimal RLE weakness; who presents with complaints of high blood pressure and headache. She was just recently admitted to the hospital on 5/12 with complaints of left-sided weakness and dysarthria found to have a acute CVA of the right basal ganglia on MRI. Patient was discharged on 5/16 to inpatient rehabilitation for which she stayed until 5/24. Upon discharge to home patient was noted to have 100% intelligible speech and improvement of left lower extremity functionality. She had been receiving physical therapy yesterday afternoon around 4:00 PM, and had been feeling bad and complaining of a dull headache in the crown region of her head. At that time they checked her blood pressure and it was 200/100. He gave her her evening dose of Lopressor orally and Xanax. He continued to recheck the blood pressure intermittently over the next hour or so, but blood pressures remained elevated. Associated symptoms included worsening of left-sided weakness of the upper and lower extremity. Husband denies any facial droop or slurred speech. Patient denies any chest pain, shortness of breath, palpitations, fever, or chills. Husband notes that the patient had been taking all of her new medications since her discharge as prescribed.    ED Course: Upon admission to the emergency department patient was seen to be afebrile, heart rate documented up to 151, blood pressure as high as 223/117, all other vital signs within normal limits. Initial lab work was unremarkable. Patient was given labetalol IV with improved.  Review of Systems: As per HPI otherwise 10 point  review of systems negative.   Past Medical History  Diagnosis Date  . Hypertension     LIABLE  . Hypercholesterolemia   . Hypothyroidism   . Vitamin D deficiency   . Palpitations   . History of dizziness   . LVH (left ventricular hypertrophy)   . Aortic stenosis   . Mitral regurgitation   . SOB (shortness of breath)   . Difficulty walking   . Cerebrovascular accident (stroke) [facility])     Past Surgical History  Procedure Laterality Date  . Cardiovascular stress test  2002    NORMAL  . Transthoracic echocardiogram  2008    SHOWED LEFT VENTRICULAR HYPERTROPHY AND MILD AORTIC STENOSIS  . Ep implantable device N/A 12/17/2015    Procedure: Loop Recorder Insertion;  Surgeon: [name], MD;  Location: [place] [facility];  Service: Cardiovascular;  Laterality: N/A;  . Tee without cardioversion N/A 12/17/2015    Procedure: TRANSESOPHAGEAL ECHOCARDIOGRAM (TEE);  Surgeon: Lelon Perla, MD;  Location: [facility] ENDOSCOPY;  Service: Cardiovascular;  Laterality: N/A;  Pt also needs a LOOP     reports that she has never smoked. She does not have any smokeless tobacco history on file. She reports that she drinks alcohol. She reports that she does not use illicit drugs.  Allergies  Allergen Reactions  . Sulfa Drugs Cross Reactors Itching  . Zocor [Simvastatin] Other (See Comments)    Feels like she is going to pass out, weakness  . Micardis [Telmisartan] Other (See Comments)    Feels like she is going to pass out    Family History  Problem Relation Age of Onset  .  Heart attack Mother   . Alzheimer's disease Mother     Prior to Admission medications   Medication Sig Start Date End Date Taking? Authorizing Provider  ALPRAZolam (XANAX) 0.25 MG tablet Take 1 tablet (0.25 mg total) by mouth at bedtime. 12/25/15  Yes Ivan Anchors Love, PA-C  aspirin EC 325 MG EC tablet Take 1 tablet (325 mg total) by mouth daily. 12/17/15  Yes Lavina Hamman, MD  B Complex-C-Folic Acid (STRESS FORMULA) TABS Take 1  tablet by mouth daily.    Yes Historical Provider, MD  carboxymethylcellulose (REFRESH PLUS) 0.5 % SOLN Place 1 drop into both eyes 3 (three) times daily as needed.   Yes Historical Provider, MD  clopidogrel (PLAVIX) 75 MG tablet Take 1 tablet (75 mg total) by mouth daily. 12/24/15  Yes Ivan Anchors Love, PA-C  estradiol (ESTRACE) 0.1 MG/GM vaginal cream Place 2 g vaginally as needed (dryness).    Yes Historical Provider, MD  levothyroxine (SYNTHROID, LEVOTHROID) 25 MCG tablet Take 25 mcg by mouth daily before breakfast.  09/21/14  Yes Historical Provider, MD  LOTEMAX 0.5 % ophthalmic suspension Place 1 drop into both eyes 4 (four) times daily as needed. For dry itchy eyes 02/17/13  Yes Historical Provider, MD  metoprolol tartrate (LOPRESSOR) 25 MG tablet Take 0.5 tablets (12.5 mg total) by mouth 2 (two) times daily. 12/24/15  Yes Ivan Anchors Love, PA-C  polyethylene glycol (MIRALAX / GLYCOLAX) packet Take 17 g by mouth daily. Patient taking differently: Take 17 g by mouth daily as needed for moderate constipation.  12/17/15  Yes Lavina Hamman, MD  amLODipine (NORVASC) 2.5 MG tablet Take 2.5 mg by mouth daily. Reported on 12/27/2015 12/12/15   Historical Provider, MD  Multiple Vitamin (MULTIVITAMIN WITH MINERALS) TABS tablet Take 1 tablet by mouth daily.    Historical Provider, MD  valsartan (DIOVAN) 320 MG tablet Take 1 tablet by mouth daily. Reported on 12/27/2015 11/25/15   Historical Provider, MD    Physical Exam: Filed Vitals:   12/28/15 0230 12/28/15 0245 12/28/15 0300 12/28/15 0315  BP: 177/87 180/73 133/63 142/65  Pulse: 73 75 66 73  Temp:      TempSrc:      Resp: 17 19 16 16   Weight:      SpO2: 96% 96% 94% 94%      Constitutional: NAD, calm, comfortable Filed Vitals:   12/28/15 0230 12/28/15 0245 12/28/15 0300 12/28/15 0315  BP: 177/87 180/73 133/63 142/65  Pulse: 73 75 66 73  Temp:      TempSrc:      Resp: 17 19 16 16   Weight:      SpO2: 96% 96% 94% 94%   Eyes: PERRL, lids and  conjunctivae normal ENMT: Mucous membranes are moist. Posterior pharynx clear of any exudate or lesions.Normal dentition.  Neck: normal, supple, no masses, no thyromegaly Respiratory: clear to auscultation bilaterally, no wheezing, no crackles. Normal respiratory effort. No accessory muscle use.  Cardiovascular: Regular rate and rhythm, no murmurs / rubs / gallops. No extremity edema. 2+ pedal pulses. No carotid bruits.  Abdomen: no tenderness, no masses palpated. No hepatosplenomegaly. Bowel sounds positive.  Musculoskeletal: no clubbing / cyanosis. No joint deformity upper and lower extremities. Good ROM, no contractures. Normal muscle tone.  Skin: no rashes, lesions, ulcers. No induration Neurologic: CN 2-12 grossly intact. Sensation intact, DTR normal. Strength 4+/5 on right upper extremity(chronic) and 5/5 now in all other extremities. No facial droop noted. Psychiatric: Normal judgment and insight. Alert and oriented  x 3. Normal mood.     Labs on Admission: I have personally reviewed following labs and imaging studies  CBC:  Recent Labs Lab 12/27/15 2314 12/27/15 2325  WBC 8.0  --   NEUTROABS 5.0  --   HGB 14.3 15.3*  HCT 42.8 45.0  MCV 93.0  --   PLT 239  --    Basic Metabolic Panel:  Recent Labs Lab 12/27/15 2314 12/27/15 2325  NA 141 144  K 3.5 3.6  CL 105 103  CO2 28  --   GLUCOSE 107* 100*  BUN 9 12  CREATININE 0.67 0.70  CALCIUM 9.8  --    GFR: Estimated Creatinine Clearance: 48.1 mL/min (by C-G formula based on Cr of 0.7). Liver Function Tests:  Recent Labs Lab 12/27/15 2314  AST 34  ALT 49  ALKPHOS 58  BILITOT 0.6  PROT 7.4  ALBUMIN 4.2   No results for input(s): LIPASE, AMYLASE in the last 168 hours. No results for input(s): AMMONIA in the last 168 hours. Coagulation Profile:  Recent Labs Lab 12/27/15 2314  INR 0.95   Cardiac Enzymes: No results for input(s): CKTOTAL, CKMB, CKMBINDEX, TROPONINI in the last 168 hours. BNP (last 3  results) No results for input(s): PROBNP in the last 8760 hours. HbA1C: No results for input(s): HGBA1C in the last 72 hours. CBG: No results for input(s): GLUCAP in the last 168 hours. Lipid Profile: No results for input(s): CHOL, HDL, LDLCALC, TRIG, CHOLHDL, LDLDIRECT in the last 72 hours. Thyroid Function Tests: No results for input(s): TSH, T4TOTAL, FREET4, T3FREE, THYROIDAB in the last 72 hours. Anemia Panel: No results for input(s): VITAMINB12, FOLATE, FERRITIN, TIBC, IRON, RETICCTPCT in the last 72 hours. Urine analysis:    Component Value Date/Time   COLORURINE YELLOW 12/24/2015 [place] 12/24/2015 1604   LABSPEC 1.027 12/24/2015 1604   PHURINE 5.5 12/24/2015 1604   GLUCOSEU NEGATIVE 12/24/2015 1604   HGBUR SMALL* 12/24/2015 1604   BILIRUBINUR NEGATIVE 12/24/2015 [place] 12/24/2015 1604   PROTEINUR NEGATIVE 12/24/2015 1604   NITRITE NEGATIVE 12/24/2015 1604   LEUKOCYTESUR SMALL* 12/24/2015 1604   Sepsis Labs: Recent Results (from the past 240 hour(s))  Culture, Urine     Status: None   Collection Time: 12/24/15  4:04 PM  Result Value Ref Range Status   Specimen Description URINE, CLEAN CATCH  Final   Special Requests NONE  Final   Culture NO GROWTH  Final   Report Status 12/25/2015 FINAL  Final     Radiological Exams on Admission: Ct Head Wo Contrast  12/28/2015  CLINICAL DATA:  Headache and left lower extremity weakness. Recent stroke. Slurred speech. EXAM: CT HEAD WITHOUT CONTRAST TECHNIQUE: Contiguous axial images were obtained from the base of the skull through the vertex without intravenous contrast. COMPARISON:  Head CT and brain MRI 12/13/2015 FINDINGS: Expected evolution of right basal gangliar infarct from prior MRI with increasing hypodensity. No hemorrhagic transformation, progression, or mass effect. Chronic small vessel ischemia or remote lacunar infarcts in the periventricular white matter, unchanged. Probable remote  lacunar infarct in the left cerebellum. No intracranial hemorrhage or midline shift. No hydrocephalus. The basilar cisterns are patent. No evidence of territorial infarct. No intracranial fluid collection. Calvarium is intact. Included paranasal sinuses and mastoid air cells are well aerated. IMPRESSION: 1. Expected evolution of recent right basal ganglia infarct. No hemorrhagic transformation, increased size or mass effect. 2. Background atrophy and chronic small vessel ischemia, unchanged. No new abnormality  is seen. Electronically Signed   By: Jeb Levering M.D.   On: 12/28/2015 01:55    EKG: Independently reviewed. Sinus rhythm with PVC  Assessment/Plan Accelerated hypertension: Uncontrolled. Initial blood pressure cyanosis 223/117. Given labetalol 20 mg IV on admission with improvement of blood pressures. - Admit to telemetry bed - Continue metoprolol, amlodipine, pharmacy substitution of irbesartan for valsartan  - Labetalol IV when necessary - Consider need of adjustment of blood pressure home medication regimen  Headache: Acute. Likely secondary to patient's elevated blood pressures - Tylenol  prn  Left-sided weakness/ recent CVA: Likely secondary to patient's recent elevation of blood pressure - Neuro checks  - Check UDS - PT to eval in a.m.   - Follow-up telemetry - continue Plavix and aspirin - Consider need of neurology consultation in a.m.   Anxiety - Xanax prn  Hypothyroidism - Continue levothyroxine   DVT prophylaxis: Lovenox Code Status: Full Family Communication: None   Disposition Plan: Possible discharge home in 1-2 days  Consults called: None Admission status: Telemetry observation  Norval Morton MD [facility] Pager [phone]  If 7PM-7AM, please contact night-coverage [email or web address] Password [facility]  12/28/2015, 4:27 AM

## 2015-12-28 NOTE — ED Notes (Signed)
To CT

## 2015-12-28 NOTE — Progress Notes (Signed)
Patient seen and evaluated earlier the same by my associate. Please refer to H&P for details regarding assessment and plan.  Patient seen and evaluated and has no new complaints.  Gen: Pt in nad, alert and awake CV: no cyanosis Pulm: No increased wob  Stacey Ramirez

## 2015-12-28 NOTE — ED Provider Notes (Signed)
Stacey Ramirez is a 75 y.o. female, with a history of recent CVA and hypertension, presenting to the ED with increased left-sided weakness beginning yesterday morning. Patient also complains of headache.  Montine Circle, PA-C HPI: "Patient presents to the emergency department with chief complaint of hypertension. She states that she had a recent stroke, and was recently discharged from the hospital. She has some residual left-sided weakness. She states that she was going through physical therapy today, and felt very fatigued. She states that she has had a mild persistent headaches since having a stroke, but states that this cut a little bit worse today. She also reports having some recent difficulty with moving her left lower extremity. This is different from the past few days. Her husband also comments that it seems like she has been "dragging her left foot more than normal." She has taken her regular blood pressure medications today with no relief. There are no modifying factors."  Past Medical History  Diagnosis Date  . Hypertension     LIABLE  . Hypercholesterolemia   . Hypothyroidism   . Vitamin D deficiency   . Palpitations   . History of dizziness   . LVH (left ventricular hypertrophy)   . Aortic stenosis   . Mitral regurgitation   . SOB (shortness of breath)   . Difficulty walking   . Cerebrovascular accident (stroke) ([place])       Physical Exam  BP 178/76 mmHg  Pulse 83  Temp(Src) 98.6 F (37 C) (Oral)  Resp 19  Wt 51.342 kg  SpO2 98%  Physical Exam  Constitutional: She is oriented to person, place, and time. She appears well-developed and well-nourished. No distress.  HENT:  Head: Normocephalic and atraumatic.  Eyes: Conjunctivae and EOM are normal. Pupils are equal, round, and reactive to light.  Neck: Normal range of motion. Neck supple.  Cardiovascular: Normal rate, regular rhythm, normal heart sounds and intact distal pulses.   Pulmonary/Chest: Effort normal and  breath sounds normal. No respiratory distress.  Abdominal: Soft. There is no tenderness. There is no guarding.  Musculoskeletal: She exhibits no edema or tenderness.  Lymphadenopathy:    She has no cervical adenopathy.  Neurological: She is alert and oriented to person, place, and time. She has normal reflexes.  No sensory deficits. Strength 4 out of 4 in left hand and left leg. Strength 5/5 in right extremities. Coordination intact. Cranial nerves III-XII grossly intact. No facial droop.   Skin: Skin is warm and dry. She is not diaphoretic.  Psychiatric: She has a normal mood and affect. Her behavior is normal.  Nursing note and vitals reviewed.   ED Course  Procedures   Results for orders placed or performed during the hospital encounter of 12/27/15  Protime-INR  Result Value Ref Range   Prothrombin Time 12.9 11.6 - 15.2 seconds   INR 0.95 0.00 - 1.49  APTT  Result Value Ref Range   aPTT 25 24 - 37 seconds  CBC  Result Value Ref Range   WBC 8.0 4.0 - 10.5 K/uL   RBC 4.60 3.87 - 5.11 MIL/uL   Hemoglobin 14.3 12.0 - 15.0 g/dL   HCT 42.8 36.0 - 46.0 %   MCV 93.0 78.0 - 100.0 fL   MCH 31.1 26.0 - 34.0 pg   MCHC 33.4 30.0 - 36.0 g/dL   RDW 12.4 11.5 - 15.5 %   Platelets 239 150 - 400 K/uL  Differential  Result Value Ref Range   Neutrophils Relative %  62 %   Neutro Abs 5.0 1.7 - 7.7 K/uL   Lymphocytes Relative 27 %   Lymphs Abs 2.2 0.7 - 4.0 K/uL   Monocytes Relative 8 %   Monocytes Absolute 0.7 0.1 - 1.0 K/uL   Eosinophils Relative 2 %   Eosinophils Absolute 0.2 0.0 - 0.7 K/uL   Basophils Relative 1 %   Basophils Absolute 0.1 0.0 - 0.1 K/uL  Comprehensive metabolic panel  Result Value Ref Range   Sodium 141 135 - 145 mmol/L   Potassium 3.5 3.5 - 5.1 mmol/L   Chloride 105 101 - 111 mmol/L   CO2 28 22 - 32 mmol/L   Glucose, Bld 107 (H) 65 - 99 mg/dL   BUN 9 6 - 20 mg/dL   Creatinine, Ser 0.67 0.44 - 1.00 mg/dL   Calcium 9.8 8.9 - 10.3 mg/dL   Total Protein 7.4 6.5 -  8.1 g/dL   Albumin 4.2 3.5 - 5.0 g/dL   AST 34 15 - 41 U/L   ALT 49 14 - 54 U/L   Alkaline Phosphatase 58 38 - 126 U/L   Total Bilirubin 0.6 0.3 - 1.2 mg/dL   GFR calc non Af Amer >60 >60 mL/min   GFR calc Af Amer >60 >60 mL/min   Anion gap 8 5 - 15  I-Stat Chem 8, ED  (not at [facility])  Result Value Ref Range   Sodium 144 135 - 145 mmol/L   Potassium 3.6 3.5 - 5.1 mmol/L   Chloride 103 101 - 111 mmol/L   BUN 12 6 - 20 mg/dL   Creatinine, Ser 0.70 0.44 - 1.00 mg/dL   Glucose, Bld 100 (H) 65 - 99 mg/dL   Calcium, Ion 1.20 1.13 - 1.30 mmol/L   TCO2 28 0 - 100 mmol/L   Hemoglobin 15.3 (H) 12.0 - 15.0 g/dL   HCT 45.0 36.0 - 46.0 %  I-stat troponin, ED (not at [facility])  Result Value Ref Range   Troponin i, poc 0.01 0.00 - 0.08 ng/mL   Comment 3           Dg Chest 2 View  12/13/2015  CLINICAL DATA:  Dizziness and weakness.  Hypertension. EXAM: CHEST  2 VIEW COMPARISON:  None. FINDINGS: There is no edema or consolidation. Heart size and pulmonary vascularity are normal. There is atherosclerotic calcification in the aorta. No adenopathy. No bone lesions. IMPRESSION: No edema or consolidation. Electronically Signed   By: Lowella Grip III M.D.   On: 12/13/2015 10:43   Ct Head Wo Contrast  12/28/2015  CLINICAL DATA:  Headache and left lower extremity weakness. Recent stroke. Slurred speech. EXAM: CT HEAD WITHOUT CONTRAST TECHNIQUE: Contiguous axial images were obtained from the base of the skull through the vertex without intravenous contrast. COMPARISON:  Head CT and brain MRI 12/13/2015 FINDINGS: Expected evolution of right basal gangliar infarct from prior MRI with increasing hypodensity. No hemorrhagic transformation, progression, or mass effect. Chronic small vessel ischemia or remote lacunar infarcts in the periventricular white matter, unchanged. Probable remote lacunar infarct in the left cerebellum. No intracranial hemorrhage or midline shift. No hydrocephalus. The basilar  cisterns are patent. No evidence of territorial infarct. No intracranial fluid collection. Calvarium is intact. Included paranasal sinuses and mastoid air cells are well aerated. IMPRESSION: 1. Expected evolution of recent right basal ganglia infarct. No hemorrhagic transformation, increased size or mass effect. 2. Background atrophy and chronic small vessel ischemia, unchanged. No new abnormality is seen. Electronically Signed  By: Jeb Levering M.D.   On: 12/28/2015 01:55   Ct Head Wo Contrast  12/13/2015  CLINICAL DATA:  Onset of inability to walk this morning. Initial encounter. No known injury. EXAM: CT HEAD WITHOUT CONTRAST TECHNIQUE: Contiguous axial images were obtained from the base of the skull through the vertex without intravenous contrast. COMPARISON:  None. FINDINGS: There is cortical atrophy and chronic microvascular ischemic change. No evidence of acute intracranial abnormality including hemorrhage, infarct, mass lesion, mass effect or abnormal extra-axial fluid collection is identified. No hydrocephalus or pneumocephalus. The calvarium is intact. Imaged paranasal sinuses and mastoid air cells are clear. IMPRESSION: No acute abnormality. Atrophy and chronic microvascular ischemic change. Electronically Signed   By: Inge Rise M.D.   On: 12/13/2015 10:41   Mr Brain Wo Contrast  12/13/2015  CLINICAL DATA:  75 year old female with slurred speech, difficulty walking and facial droop symptom onset today. Initial encounter. EXAM: MRI HEAD WITHOUT CONTRAST MRA HEAD WITHOUT CONTRAST TECHNIQUE: Multiplanar, multiecho pulse sequences of the brain and surrounding structures were obtained without intravenous contrast. Angiographic images of the head were obtained using MRA technique without contrast. COMPARISON:  Head CT without contrast 0955 hours today. Head and neck MRA a 04/19/2013. FINDINGS: MRI HEAD FINDINGS Major intracranial vascular flow voids are preserved. Confluent 3.5 cm area of  restricted diffusion in the right basal ganglia. Minimal to mild associated T2 and FLAIR hyperintensity. No associated hemorrhage. Mild regional mass effect, no significant intracranial mass effect. There is a superimposed punctate area of periventricular white matter restricted diffusion at the atrium on the right (series 8, image 11). No contralateral or posterior fossa restricted diffusion. Superimposed small chronic lacunar infarcts in both cerebellar hemispheres an the thalamus. Increased perivascular spaces throughout the basal ganglia. Patchy mostly posterior hemisphere nonspecific cerebral white matter T2 and FLAIR hyperintensity. Some of these white matter changes ppm most resemble chronic lacunar infarcts. No supratentorial cortical encephalomalacia identified. There are scattered chronic micro hemorrhages in the temporal lobes and cerebellum. No midline shift, evidence of mass lesion, ventriculomegaly, extra-axial collection or acute intracranial hemorrhage. Cervicomedullary junction and pituitary are within normal limits. Negative visualized cervical spine. Mildly prominent internal auditory canals with otherwise negative visualized internal auditory structures. Mastoids are clear. Paranasal sinuses are clear. Negative orbit and scalp soft tissues. Visualized bone marrow signal is within normal limits. MRA HEAD FINDINGS Stable antegrade flow in the posterior circulation with fairly codominant distal vertebral arteries. Both PICA origins remain patent. Vertebrobasilar junction remains patent. Mild distal vertebral artery irregularity without stenosis appears stable. Mid basilar artery irregularity with up to mild stenosis has mildly progressed. SCA and PCA origins remain patent. There is mild to moderate stenosis in the right PCA P1 segment which is new. Bilateral PCA branches are stable. Posterior communicating arteries are diminutive or absent. Stable antegrade flow in both ICA siphons and distal  cervical ICAs. Bilateral siphon atherosclerosis and irregularity. At least moderate stenosis just distal to the right ICA anterior genu appears stable (series 705, image 6). Normal ophthalmic artery origins. Patent carotid termini. The right MCA origin and M1 segment remain patent. Mild M1 irregularity has not significantly changed. Right MCA bifurcation remains patent. Visualized right MCA branches appear stable with mild distal M2 and M3 branch irregularity. No right MCA branch occlusion identified. Bilateral ACA and left MCA branches are stable with mild irregularity. IMPRESSION: 1. Confluent acute infarct in the right basal ganglia with no associated hemorrhage or mass effect. Superimposed punctate posterior right MCA periventricular white matter  infarct. 2. Negative for emergent large vessel occlusion. Anterior circulation atherosclerosis appears stable since 2014, including at least moderate stenosis of the supraclinoid right ICA (series 705, image 6). 3. Mild progression of posterior circulation atherosclerosis since 2014 including new mild stenosis of the mid basilar artery and moderate stenosis of the right PCA P1 segment. 4. Underlying chronic small vessel ischemia, moderate for age. Electronically Signed   By: Genevie Ann M.D.   On: 12/13/2015 14:33   Mr Jodene Nam Head/brain Wo Cm  12/13/2015  CLINICAL DATA:  75 year old female with slurred speech, difficulty walking and facial droop symptom onset today. Initial encounter. EXAM: MRI HEAD WITHOUT CONTRAST MRA HEAD WITHOUT CONTRAST TECHNIQUE: Multiplanar, multiecho pulse sequences of the brain and surrounding structures were obtained without intravenous contrast. Angiographic images of the head were obtained using MRA technique without contrast. COMPARISON:  Head CT without contrast 0955 hours today. Head and neck MRA a 04/19/2013. FINDINGS: MRI HEAD FINDINGS Major intracranial vascular flow voids are preserved. Confluent 3.5 cm area of restricted diffusion in the  right basal ganglia. Minimal to mild associated T2 and FLAIR hyperintensity. No associated hemorrhage. Mild regional mass effect, no significant intracranial mass effect. There is a superimposed punctate area of periventricular white matter restricted diffusion at the atrium on the right (series 8, image 11). No contralateral or posterior fossa restricted diffusion. Superimposed small chronic lacunar infarcts in both cerebellar hemispheres an the thalamus. Increased perivascular spaces throughout the basal ganglia. Patchy mostly posterior hemisphere nonspecific cerebral white matter T2 and FLAIR hyperintensity. Some of these white matter changes ppm most resemble chronic lacunar infarcts. No supratentorial cortical encephalomalacia identified. There are scattered chronic micro hemorrhages in the temporal lobes and cerebellum. No midline shift, evidence of mass lesion, ventriculomegaly, extra-axial collection or acute intracranial hemorrhage. Cervicomedullary junction and pituitary are within normal limits. Negative visualized cervical spine. Mildly prominent internal auditory canals with otherwise negative visualized internal auditory structures. Mastoids are clear. Paranasal sinuses are clear. Negative orbit and scalp soft tissues. Visualized bone marrow signal is within normal limits. MRA HEAD FINDINGS Stable antegrade flow in the posterior circulation with fairly codominant distal vertebral arteries. Both PICA origins remain patent. Vertebrobasilar junction remains patent. Mild distal vertebral artery irregularity without stenosis appears stable. Mid basilar artery irregularity with up to mild stenosis has mildly progressed. SCA and PCA origins remain patent. There is mild to moderate stenosis in the right PCA P1 segment which is new. Bilateral PCA branches are stable. Posterior communicating arteries are diminutive or absent. Stable antegrade flow in both ICA siphons and distal cervical ICAs. Bilateral siphon  atherosclerosis and irregularity. At least moderate stenosis just distal to the right ICA anterior genu appears stable (series 705, image 6). Normal ophthalmic artery origins. Patent carotid termini. The right MCA origin and M1 segment remain patent. Mild M1 irregularity has not significantly changed. Right MCA bifurcation remains patent. Visualized right MCA branches appear stable with mild distal M2 and M3 branch irregularity. No right MCA branch occlusion identified. Bilateral ACA and left MCA branches are stable with mild irregularity. IMPRESSION: 1. Confluent acute infarct in the right basal ganglia with no associated hemorrhage or mass effect. Superimposed punctate posterior right MCA periventricular white matter infarct. 2. Negative for emergent large vessel occlusion. Anterior circulation atherosclerosis appears stable since 2014, including at least moderate stenosis of the supraclinoid right ICA (series 705, image 6). 3. Mild progression of posterior circulation atherosclerosis since 2014 including new mild stenosis of the mid basilar artery and moderate stenosis of  the right PCA P1 segment. 4. Underlying chronic small vessel ischemia, moderate for age. Electronically Signed   By: Genevie Ann M.D.   On: 12/13/2015 14:33      MDM Reece Levy Riner presents with increased weakness, hypertension, and headache today.  Findings and plan of care discussed with Ankit Nanavati.   Took patient care handoff report from Montine Circle, PA-C. Hypertensive with headache. Worsening LLE weakness. Pending CT. BP controlled. Possible obs admission.  If CT normal, stable BP, no headache, can walk per her baseline as of yesterday, discharge home. Has a known deficit from recent stroke.  Upon my initial assessment, patient is pain-free and blood pressure is 165/88. However, patient still feels weaker on the left than she was yesterday. Patient states she feels too weak to ambulate safely. 2:40 AM Spoke with Dr. Tamala Julian,  who agreed to admit the patient to telemetry observation. States he will evaluate the patient in the ED and placed the admission orders.    Filed Vitals:   12/28/15 0015 12/28/15 0030 12/28/15 0100 12/28/15 0115  BP: 175/69 165/73 174/81 187/88  Pulse: 103 72  73  Temp:      TempSrc:      Resp: 17 15  15   Weight:      SpO2: 98% 97%  94%   Filed Vitals:   12/28/15 0230 12/28/15 0245 12/28/15 0300 12/28/15 0315  BP: 177/87 180/73 133/63 142/65  Pulse: 73 75 66 73  Temp:      TempSrc:      Resp: 17 19 16 16   Weight:      SpO2: 96% 96% 94% 94%     Lorayne Bender, PA-C 12/28/15 0329  Merrily Pew, MD 12/28/15 2318

## 2015-12-29 DIAGNOSIS — I10 Essential (primary) hypertension: Secondary | ICD-10-CM

## 2015-12-29 MED ORDER — IRBESARTAN 300 MG PO TABS: 300.0000 mg | ORAL_TABLET | Freq: Every day | ORAL | Status: DC

## 2015-12-29 NOTE — Progress Notes (Signed)
Pt d/c to home by car with family. Assessment stable. Prescription given. All questions answered 

## 2015-12-29 NOTE — Care Management Note (Addendum)
Case Management Note  Patient Details  Name: Stacey Ramirez MRN: [id number] Date of Birth: [birth year]  Subjective/Objective:   Hypertension, Stroke             Action/Plan: Discharge Planning: AVS reviewed:  NCM spoke to pt and husband. She has shower chair at home. She can afford her medications. Pt active with [facility] for HH. Contacted [facility] for resumption of care. Pt observation and roc orders not needed.    Expected Discharge Date:  12/29/2015               Expected Discharge Plan:  [place]  In-House Referral:  NA  Discharge planning Services  CM Consult  Post Acute Care Choice:  Home Health, Resumption of Svcs/PTA Provider Choice offered to:  Patient  DME Arranged:  N/A DME Agency:  NA  HH Arranged:  PT, OT, Speech Therapy HH Agency:  [facility]  Status of Service:  Completed, signed off  Medicare Important Message Given:    Date Medicare IM Given:    Medicare IM give by:    Date Additional Medicare IM Given:    Additional Medicare Important Message give by:     If discussed at [place] of Stay Meetings, dates discussed:    Additional Comments:  Erenest Rasher, RN 12/29/2015, 11:57 AM

## 2015-12-29 NOTE — Discharge Summary (Signed)
Physician Discharge Summary  Stacey Ramirez [id number] DOB: [birth year] DOA: 12/27/2015  PCP: Horatio Pel, MD  Admit date: 12/27/2015 Discharge date: 12/29/2015  Time spent: > 35 minutes  Recommendations for Outpatient Follow-up:   Monitor LDL levels and adjust medications accordingly. ARB recently changed with improvement in BP control  Discharge Diagnoses:  Principal Problem:   Accelerated hypertension Active Problems:   Hypothyroidism   History of CVA with residual deficit   Left-sided weakness   Headache   Discharge Condition: stable  Diet recommendation: heart healthy  Filed Weights   12/27/15 2035 12/28/15 0619 12/29/15 0600  Weight: 51.342 kg (113 lb 3 oz) 50.44 kg (111 lb 3.2 oz) 52.799 kg (116 lb 6.4 oz)    History of present illness:  75 y/o with history of CVA that presented to the hospital complaining of Headache. Was found to have elevated blood pressure  Hospital Course:  Elevated BP - improved with current antihypertensive regimen. Will continue on discharge  HA - most likely secondary to elevated blood pressures - resolving  CVA - continue current regimen. Patient reported weakness as a side effect on crestor. Has plans to follow up with pcp for further evaluation and recommendations. Patient preferred to discuss with her pcp instead of trying something else here at the hospital  Procedures:  None  Consultations:  none  Discharge Exam: Filed Vitals:   12/29/15 0636 12/29/15 0925  BP: 134/78 137/80  Pulse: 68 69  Temp: 97.7 F (36.5 C) 98.9 F (37.2 C)  Resp: 16 15    General: Pt in nad, alert and awake Cardiovascular: rrr, no rubs Respiratory: no increased wob, no wheezes  Discharge Instructions   Discharge Instructions    Call MD for:  difficulty breathing, headache or visual disturbances    Complete by:  As directed      Call MD for:  temperature >100.4    Complete by:  As directed      Diet - low sodium heart  healthy    Complete by:  As directed      Discharge instructions    Complete by:  As directed   Please follow up with your Neurologist for follow up regarding your recent stroke prior to this admission. On discharge call your primary care physician or Neurologist to discuss treatment options regarding your LDL.     Increase activity slowly    Complete by:  As directed           Current Discharge Medication List    START taking these medications   Details  irbesartan (AVAPRO) 300 MG tablet Take 1 tablet (300 mg total) by mouth daily. Qty: 30 tablet, Refills: 0      CONTINUE these medications which have NOT CHANGED   Details  ALPRAZolam (XANAX) 0.25 MG tablet Take 1 tablet (0.25 mg total) by mouth at bedtime. Qty: 30 tablet, Refills: 1   Associated Diagnoses: Anxiety    aspirin EC 325 MG EC tablet Take 1 tablet (325 mg total) by mouth daily. Qty: 120 tablet, Refills: 0    B Complex-C-Folic Acid (STRESS FORMULA) TABS Take 1 tablet by mouth daily.     carboxymethylcellulose (REFRESH PLUS) 0.5 % SOLN Place 1 drop into both eyes 3 (three) times daily as needed.    clopidogrel (PLAVIX) 75 MG tablet Take 1 tablet (75 mg total) by mouth daily. Qty: 30 tablet, Refills: 0    estradiol (ESTRACE) 0.1 MG/GM vaginal cream Place 2 g vaginally as needed (dryness).  levothyroxine (SYNTHROID, LEVOTHROID) 25 MCG tablet Take 25 mcg by mouth daily before breakfast.  Refills: 6    LOTEMAX 0.5 % ophthalmic suspension Place 1 drop into both eyes 4 (four) times daily as needed. For dry itchy eyes    metoprolol tartrate (LOPRESSOR) 25 MG tablet Take 0.5 tablets (12.5 mg total) by mouth 2 (two) times daily. Qty: 30 tablet, Refills: 0    polyethylene glycol (MIRALAX / GLYCOLAX) packet Take 17 g by mouth daily. Qty: 14 each, Refills: 0    amLODipine (NORVASC) 2.5 MG tablet Take 2.5 mg by mouth daily. Reported on 12/27/2015    Multiple Vitamin (MULTIVITAMIN WITH MINERALS) TABS tablet Take 1  tablet by mouth daily.      STOP taking these medications     valsartan (DIOVAN) 320 MG tablet        Allergies  Allergen Reactions  . Sulfa Drugs Cross Reactors Itching  . Zocor [Simvastatin] Other (See Comments)    Feels like she is going to pass out, weakness  . Micardis [Telmisartan] Other (See Comments)    Feels like she is going to pass out      The results of significant diagnostics from this hospitalization (including imaging, microbiology, ancillary and laboratory) are listed below for reference.    Significant Diagnostic Studies: Dg Chest 2 View  12/13/2015  CLINICAL DATA:  Dizziness and weakness.  Hypertension. EXAM: CHEST  2 VIEW COMPARISON:  None. FINDINGS: There is no edema or consolidation. Heart size and pulmonary vascularity are normal. There is atherosclerotic calcification in the aorta. No adenopathy. No bone lesions. IMPRESSION: No edema or consolidation. Electronically Signed   By: Lowella Grip III M.D.   On: 12/13/2015 10:43   Ct Head Wo Contrast  12/28/2015  CLINICAL DATA:  Headache and left lower extremity weakness. Recent stroke. Slurred speech. EXAM: CT HEAD WITHOUT CONTRAST TECHNIQUE: Contiguous axial images were obtained from the base of the skull through the vertex without intravenous contrast. COMPARISON:  Head CT and brain MRI 12/13/2015 FINDINGS: Expected evolution of right basal gangliar infarct from prior MRI with increasing hypodensity. No hemorrhagic transformation, progression, or mass effect. Chronic small vessel ischemia or remote lacunar infarcts in the periventricular white matter, unchanged. Probable remote lacunar infarct in the left cerebellum. No intracranial hemorrhage or midline shift. No hydrocephalus. The basilar cisterns are patent. No evidence of territorial infarct. No intracranial fluid collection. Calvarium is intact. Included paranasal sinuses and mastoid air cells are well aerated. IMPRESSION: 1. Expected evolution of recent  right basal ganglia infarct. No hemorrhagic transformation, increased size or mass effect. 2. Background atrophy and chronic small vessel ischemia, unchanged. No new abnormality is seen. Electronically Signed   By: Jeb Levering M.D.   On: 12/28/2015 01:55   Ct Head Wo Contrast  12/13/2015  CLINICAL DATA:  Onset of inability to walk this morning. Initial encounter. No known injury. EXAM: CT HEAD WITHOUT CONTRAST TECHNIQUE: Contiguous axial images were obtained from the base of the skull through the vertex without intravenous contrast. COMPARISON:  None. FINDINGS: There is cortical atrophy and chronic microvascular ischemic change. No evidence of acute intracranial abnormality including hemorrhage, infarct, mass lesion, mass effect or abnormal extra-axial fluid collection is identified. No hydrocephalus or pneumocephalus. The calvarium is intact. Imaged paranasal sinuses and mastoid air cells are clear. IMPRESSION: No acute abnormality. Atrophy and chronic microvascular ischemic change. Electronically Signed   By: Inge Rise M.D.   On: 12/13/2015 10:41   Mr Brain Wo Contrast  12/13/2015  CLINICAL DATA:  75 year old female with slurred speech, difficulty walking and facial droop symptom onset today. Initial encounter. EXAM: MRI HEAD WITHOUT CONTRAST MRA HEAD WITHOUT CONTRAST TECHNIQUE: Multiplanar, multiecho pulse sequences of the brain and surrounding structures were obtained without intravenous contrast. Angiographic images of the head were obtained using MRA technique without contrast. COMPARISON:  Head CT without contrast 0955 hours today. Head and neck MRA a 04/19/2013. FINDINGS: MRI HEAD FINDINGS Major intracranial vascular flow voids are preserved. Confluent 3.5 cm area of restricted diffusion in the right basal ganglia. Minimal to mild associated T2 and FLAIR hyperintensity. No associated hemorrhage. Mild regional mass effect, no significant intracranial mass effect. There is a superimposed  punctate area of periventricular white matter restricted diffusion at the atrium on the right (series 8, image 11). No contralateral or posterior fossa restricted diffusion. Superimposed small chronic lacunar infarcts in both cerebellar hemispheres an the thalamus. Increased perivascular spaces throughout the basal ganglia. Patchy mostly posterior hemisphere nonspecific cerebral white matter T2 and FLAIR hyperintensity. Some of these white matter changes ppm most resemble chronic lacunar infarcts. No supratentorial cortical encephalomalacia identified. There are scattered chronic micro hemorrhages in the temporal lobes and cerebellum. No midline shift, evidence of mass lesion, ventriculomegaly, extra-axial collection or acute intracranial hemorrhage. Cervicomedullary junction and pituitary are within normal limits. Negative visualized cervical spine. Mildly prominent internal auditory canals with otherwise negative visualized internal auditory structures. Mastoids are clear. Paranasal sinuses are clear. Negative orbit and scalp soft tissues. Visualized bone marrow signal is within normal limits. MRA HEAD FINDINGS Stable antegrade flow in the posterior circulation with fairly codominant distal vertebral arteries. Both PICA origins remain patent. Vertebrobasilar junction remains patent. Mild distal vertebral artery irregularity without stenosis appears stable. Mid basilar artery irregularity with up to mild stenosis has mildly progressed. SCA and PCA origins remain patent. There is mild to moderate stenosis in the right PCA P1 segment which is new. Bilateral PCA branches are stable. Posterior communicating arteries are diminutive or absent. Stable antegrade flow in both ICA siphons and distal cervical ICAs. Bilateral siphon atherosclerosis and irregularity. At least moderate stenosis just distal to the right ICA anterior genu appears stable (series 705, image 6). Normal ophthalmic artery origins. Patent carotid  termini. The right MCA origin and M1 segment remain patent. Mild M1 irregularity has not significantly changed. Right MCA bifurcation remains patent. Visualized right MCA branches appear stable with mild distal M2 and M3 branch irregularity. No right MCA branch occlusion identified. Bilateral ACA and left MCA branches are stable with mild irregularity. IMPRESSION: 1. Confluent acute infarct in the right basal ganglia with no associated hemorrhage or mass effect. Superimposed punctate posterior right MCA periventricular white matter infarct. 2. Negative for emergent large vessel occlusion. Anterior circulation atherosclerosis appears stable since 2014, including at least moderate stenosis of the supraclinoid right ICA (series 705, image 6). 3. Mild progression of posterior circulation atherosclerosis since 2014 including new mild stenosis of the mid basilar artery and moderate stenosis of the right PCA P1 segment. 4. Underlying chronic small vessel ischemia, moderate for age. Electronically Signed   By: Genevie Ann M.D.   On: 12/13/2015 14:33   Mr Jodene Nam Head/brain Wo Cm  12/13/2015  CLINICAL DATA:  75 year old female with slurred speech, difficulty walking and facial droop symptom onset today. Initial encounter. EXAM: MRI HEAD WITHOUT CONTRAST MRA HEAD WITHOUT CONTRAST TECHNIQUE: Multiplanar, multiecho pulse sequences of the brain and surrounding structures were obtained without intravenous contrast. Angiographic images of the head were obtained using  MRA technique without contrast. COMPARISON:  Head CT without contrast 0955 hours today. Head and neck MRA a 04/19/2013. FINDINGS: MRI HEAD FINDINGS Major intracranial vascular flow voids are preserved. Confluent 3.5 cm area of restricted diffusion in the right basal ganglia. Minimal to mild associated T2 and FLAIR hyperintensity. No associated hemorrhage. Mild regional mass effect, no significant intracranial mass effect. There is a superimposed punctate area of  periventricular white matter restricted diffusion at the atrium on the right (series 8, image 11). No contralateral or posterior fossa restricted diffusion. Superimposed small chronic lacunar infarcts in both cerebellar hemispheres an the thalamus. Increased perivascular spaces throughout the basal ganglia. Patchy mostly posterior hemisphere nonspecific cerebral white matter T2 and FLAIR hyperintensity. Some of these white matter changes ppm most resemble chronic lacunar infarcts. No supratentorial cortical encephalomalacia identified. There are scattered chronic micro hemorrhages in the temporal lobes and cerebellum. No midline shift, evidence of mass lesion, ventriculomegaly, extra-axial collection or acute intracranial hemorrhage. Cervicomedullary junction and pituitary are within normal limits. Negative visualized cervical spine. Mildly prominent internal auditory canals with otherwise negative visualized internal auditory structures. Mastoids are clear. Paranasal sinuses are clear. Negative orbit and scalp soft tissues. Visualized bone marrow signal is within normal limits. MRA HEAD FINDINGS Stable antegrade flow in the posterior circulation with fairly codominant distal vertebral arteries. Both PICA origins remain patent. Vertebrobasilar junction remains patent. Mild distal vertebral artery irregularity without stenosis appears stable. Mid basilar artery irregularity with up to mild stenosis has mildly progressed. SCA and PCA origins remain patent. There is mild to moderate stenosis in the right PCA P1 segment which is new. Bilateral PCA branches are stable. Posterior communicating arteries are diminutive or absent. Stable antegrade flow in both ICA siphons and distal cervical ICAs. Bilateral siphon atherosclerosis and irregularity. At least moderate stenosis just distal to the right ICA anterior genu appears stable (series 705, image 6). Normal ophthalmic artery origins. Patent carotid termini. The right MCA  origin and M1 segment remain patent. Mild M1 irregularity has not significantly changed. Right MCA bifurcation remains patent. Visualized right MCA branches appear stable with mild distal M2 and M3 branch irregularity. No right MCA branch occlusion identified. Bilateral ACA and left MCA branches are stable with mild irregularity. IMPRESSION: 1. Confluent acute infarct in the right basal ganglia with no associated hemorrhage or mass effect. Superimposed punctate posterior right MCA periventricular white matter infarct. 2. Negative for emergent large vessel occlusion. Anterior circulation atherosclerosis appears stable since 2014, including at least moderate stenosis of the supraclinoid right ICA (series 705, image 6). 3. Mild progression of posterior circulation atherosclerosis since 2014 including new mild stenosis of the mid basilar artery and moderate stenosis of the right PCA P1 segment. 4. Underlying chronic small vessel ischemia, moderate for age. Electronically Signed   By: Genevie Ann M.D.   On: 12/13/2015 14:33    Microbiology: Recent Results (from the past 240 hour(s))  Culture, Urine     Status: None   Collection Time: 12/24/15  4:04 PM  Result Value Ref Range Status   Specimen Description URINE, CLEAN CATCH  Final   Special Requests NONE  Final   Culture NO GROWTH  Final   Report Status 12/25/2015 FINAL  Final     Labs: Basic Metabolic Panel:  Recent Labs Lab 12/27/15 0540 12/27/15 2314 12/27/15 2325  NA  --  141 144  K  --  3.5 3.6  CL  --  105 103  CO2  --  28  --  GLUCOSE  --  107* 100*  BUN  --  9 12  CREATININE  --  0.67 0.70  CALCIUM  --  9.8  --   MG 2.0  --   --    Liver Function Tests:  Recent Labs Lab 12/27/15 2314  AST 34  ALT 49  ALKPHOS 58  BILITOT 0.6  PROT 7.4  ALBUMIN 4.2   No results for input(s): LIPASE, AMYLASE in the last 168 hours. No results for input(s): AMMONIA in the last 168 hours. CBC:  Recent Labs Lab 12/27/15 2314 12/27/15 2325   WBC 8.0  --   NEUTROABS 5.0  --   HGB 14.3 15.3*  HCT 42.8 45.0  MCV 93.0  --   PLT 239  --    Cardiac Enzymes: No results for input(s): CKTOTAL, CKMB, CKMBINDEX, TROPONINI in the last 168 hours. BNP: BNP (last 3 results) No results for input(s): BNP in the last 8760 hours.  ProBNP (last 3 results) No results for input(s): PROBNP in the last 8760 hours.  CBG: No results for input(s): GLUCAP in the last 168 hours.     Signed:  Velvet Bathe MD.  [facility] 12/29/2015, 11:32 AM

## 2015-12-29 NOTE — Evaluation (Signed)
Physical Therapy Evaluation Patient Details Name: Stacey Ramirez MRN: [id number] DOB: [birth year] Today's Date: 12/29/2015   History of Present Illness  Patient is a 75 y.o. female admitted with uncontrolled HTN. PMH significant for  HTN, hypothyroidism, Mitral regurgitation, hypercholesterolemia, dizziness, SOB, aortic stenosis, stroke,and recent  R-right basal ganglia infarct, Rt MCA infarct.  Clinical Impression  Pt admitted with above diagnosis. Pt currently with functional limitations due to the deficits listed below (see PT Problem List). Pt just completed intensive inpatient rehab program and was receiving HHPT.  Would recommend continuation of HHPT as pt does continue to display mobility deficits from stroke.  No new acute needs from this admission. Recommend pt continue to ambulate with nursing assistance to prevent hospital acquired weakness. Anticipate short length of stay as pt is observation status therefore did not recommend any further acute PT services in the hospital. Pt and husband in agreement.       Follow Up Recommendations Home health PT;Other (comment);Supervision for mobility/OOB (was receiving HHPT prior to admission)    Equipment Recommendations  None recommended by PT    Recommendations for Other Services       Precautions / Restrictions Precautions Precautions: Fall Restrictions Weight Bearing Restrictions: No      Mobility  Bed Mobility Overal bed mobility:  (NT, pt up in chair)                Transfers Overall transfer level: Needs assistance Equipment used: None Transfers: Sit to/from Stand Sit to Stand: Supervision         General transfer comment: no physical assist needed  Ambulation/Gait Ambulation/Gait assistance: Supervision Ambulation Distance (Feet): 150 Feet Assistive device: None Gait Pattern/deviations: Step-through pattern;Drifts right/left     General Gait Details: pt demonstrated no balance losses with gait, but did  have some drifting, mainly to the left. Perfomed functional tasks in the room without difficulty. Pt reports furniture walking at home.   Stairs            Wheelchair Mobility    Modified Rankin (Stroke Patients Only)       Balance                                             Pertinent Vitals/Pain Pain Assessment: No/denies pain    Home Living Family/patient expects to be discharged to:: Private residence Living Arrangements: Spouse/significant other Available Help at Discharge: Family;Available 24 hours/day Type of Home: House Home Access: Stairs to enter Entrance Stairs-Rails: None Entrance Stairs-Number of Steps: 2 Home Layout: Two level;Able to live on main level with bedroom/bathroom Home Equipment: None      Prior Function Level of Independence: Needs assistance (husband around to assist as needed)               Hand Dominance   Dominant Hand: Right    Extremity/Trunk Assessment   Upper Extremity Assessment: Defer to OT evaluation           Lower Extremity Assessment: LLE deficits/detail   LLE Deficits / Details: decreased strength in functional activites  Cervical / Trunk Assessment: Normal  Communication   Communication: No difficulties  Cognition Arousal/Alertness: Awake/alert Behavior During Therapy: WFL for tasks assessed/performed;Flat affect Overall Cognitive Status: Within Functional Limits for tasks assessed  General Comments General comments (skin integrity, edema, etc.): Husband present for entire session. He reports he will be around at all times when pt discharged and is very capable of assisting pt.    Exercises        Assessment/Plan    PT Assessment All further PT needs can be met in the next venue of care  PT Diagnosis Difficulty walking;Abnormality of gait   PT Problem List Decreased balance;Decreased mobility;Decreased strength  PT Treatment Interventions     PT  Goals (Current goals can be found in the Care Plan section) Acute Rehab PT Goals Patient Stated Goal: to return home    Frequency     Barriers to discharge        Co-evaluation               End of Session Equipment Utilized During Treatment: Gait belt Activity Tolerance: Patient tolerated treatment well Patient left: in chair;with bed alarm set;with family/visitor present Nurse Communication: Mobility status    Functional Assessment Tool Used: clinical judgment Functional Limitation: Mobility: Walking and moving around Mobility: Walking and Moving Around Current Status (W2376): At least 1 percent but less than 20 percent impaired, limited or restricted Mobility: Walking and Moving Around Goal Status [phone]): At least 1 percent but less than 20 percent impaired, limited or restricted Mobility: Walking and Moving Around Discharge Status [phone]): At least 1 percent but less than 20 percent impaired, limited or restricted    Time:  -      Charges:   PT Evaluation $PT Eval Low Complexity: 1 Procedure PT Treatments $Gait Training: 8-22 mins   PT G Codes:   PT G-Codes **NOT FOR INPATIENT CLASS** Functional Assessment Tool Used: clinical judgment Functional Limitation: Mobility: Walking and moving around Mobility: Walking and Moving Around Current Status (W7371): At least 1 percent but less than 20 percent impaired, limited or restricted Mobility: Walking and Moving Around Goal Status [phone]): At least 1 percent but less than 20 percent impaired, limited or restricted Mobility: Walking and Moving Around Discharge Status [phone]): At least 1 percent but less than 20 percent impaired, limited or restricted    Melvern Banker 12/29/2015, 9:49 AM  Lavonia Dana, PT  [phone] 12/29/2015

## 2015-12-30 DIAGNOSIS — I350 Nonrheumatic aortic (valve) stenosis: Secondary | ICD-10-CM | POA: Diagnosis not present

## 2015-12-30 DIAGNOSIS — I10 Essential (primary) hypertension: Secondary | ICD-10-CM | POA: Diagnosis not present

## 2015-12-30 DIAGNOSIS — I69328 Other speech and language deficits following cerebral infarction: Secondary | ICD-10-CM | POA: Diagnosis not present

## 2015-12-30 DIAGNOSIS — Z7982 Long term (current) use of aspirin: Secondary | ICD-10-CM | POA: Diagnosis not present

## 2015-12-30 DIAGNOSIS — I69354 Hemiplegia and hemiparesis following cerebral infarction affecting left non-dominant side: Secondary | ICD-10-CM | POA: Diagnosis not present

## 2015-12-30 DIAGNOSIS — Z7901 Long term (current) use of anticoagulants: Secondary | ICD-10-CM | POA: Diagnosis not present

## 2015-12-30 DIAGNOSIS — E039 Hypothyroidism, unspecified: Secondary | ICD-10-CM | POA: Diagnosis not present

## 2016-01-01 ENCOUNTER — Emergency Department (HOSPITAL_BASED_OUTPATIENT_CLINIC_OR_DEPARTMENT_OTHER): Payer: PPO

## 2016-01-01 ENCOUNTER — Ambulatory Visit (INDEPENDENT_AMBULATORY_CARE_PROVIDER_SITE_OTHER): Payer: PPO | Admitting: *Deleted

## 2016-01-01 ENCOUNTER — Encounter (HOSPITAL_BASED_OUTPATIENT_CLINIC_OR_DEPARTMENT_OTHER): Payer: Self-pay | Admitting: *Deleted

## 2016-01-01 ENCOUNTER — Emergency Department (HOSPITAL_BASED_OUTPATIENT_CLINIC_OR_DEPARTMENT_OTHER)
Admission: EM | Admit: 2016-01-01 | Discharge: 2016-01-01 | Disposition: A | Discharge status: Home / Self Care | Payer: PPO | Attending: Emergency Medicine | Admitting: Emergency Medicine

## 2016-01-01 ENCOUNTER — Encounter: Payer: Self-pay | Admitting: Internal Medicine

## 2016-01-01 DIAGNOSIS — Z7901 Long term (current) use of anticoagulants: Secondary | ICD-10-CM | POA: Diagnosis not present

## 2016-01-01 DIAGNOSIS — Z7982 Long term (current) use of aspirin: Secondary | ICD-10-CM | POA: Insufficient documentation

## 2016-01-01 DIAGNOSIS — Z8673 Personal history of transient ischemic attack (TIA), and cerebral infarction without residual deficits: Secondary | ICD-10-CM | POA: Diagnosis not present

## 2016-01-01 DIAGNOSIS — E876 Hypokalemia: Secondary | ICD-10-CM | POA: Diagnosis not present

## 2016-01-01 DIAGNOSIS — I69328 Other speech and language deficits following cerebral infarction: Secondary | ICD-10-CM | POA: Diagnosis not present

## 2016-01-01 DIAGNOSIS — R51 Headache: Secondary | ICD-10-CM | POA: Diagnosis not present

## 2016-01-01 DIAGNOSIS — Z79899 Other long term (current) drug therapy: Secondary | ICD-10-CM | POA: Diagnosis not present

## 2016-01-01 DIAGNOSIS — I10 Essential (primary) hypertension: Secondary | ICD-10-CM | POA: Diagnosis not present

## 2016-01-01 DIAGNOSIS — R531 Weakness: Secondary | ICD-10-CM | POA: Insufficient documentation

## 2016-01-01 DIAGNOSIS — I69354 Hemiplegia and hemiparesis following cerebral infarction affecting left non-dominant side: Secondary | ICD-10-CM | POA: Diagnosis not present

## 2016-01-01 DIAGNOSIS — E039 Hypothyroidism, unspecified: Secondary | ICD-10-CM | POA: Diagnosis not present

## 2016-01-01 DIAGNOSIS — I6350 Cerebral infarction due to unspecified occlusion or stenosis of unspecified cerebral artery: Secondary | ICD-10-CM

## 2016-01-01 DIAGNOSIS — R42 Dizziness and giddiness: Secondary | ICD-10-CM | POA: Insufficient documentation

## 2016-01-01 DIAGNOSIS — R0602 Shortness of breath: Secondary | ICD-10-CM | POA: Diagnosis not present

## 2016-01-01 DIAGNOSIS — I350 Nonrheumatic aortic (valve) stenosis: Secondary | ICD-10-CM | POA: Diagnosis not present

## 2016-01-01 LAB — CBC
HCT: 41 % (ref 36.0–46.0)
Hemoglobin: 13.9 g/dL (ref 12.0–15.0)
MCH: 32.4 pg (ref 26.0–34.0)
MCHC: 33.9 g/dL (ref 30.0–36.0)
MCV: 95.6 fL (ref 78.0–100.0)
PLATELETS: 231 10*3/uL (ref 150–400)
RBC: 4.29 MIL/uL (ref 3.87–5.11)
RDW: 12.6 % (ref 11.5–15.5)
WBC: 8.8 10*3/uL (ref 4.0–10.5)

## 2016-01-01 LAB — CUP PACEART INCLINIC DEVICE CHECK: MDC IDC SESS DTM: 20170531144434

## 2016-01-01 LAB — URINALYSIS, ROUTINE W REFLEX MICROSCOPIC
Bilirubin Urine: NEGATIVE
Glucose, UA: NEGATIVE mg/dL
Hgb urine dipstick: NEGATIVE
Ketones, ur: NEGATIVE mg/dL
LEUKOCYTES UA: NEGATIVE
NITRITE: NEGATIVE
PH: 7 (ref 5.0–8.0)
Protein, ur: NEGATIVE mg/dL
SPECIFIC GRAVITY, URINE: 1.007 (ref 1.005–1.030)

## 2016-01-01 LAB — TROPONIN I: Troponin I: <0.03 ng/mL (ref <0.031)

## 2016-01-01 LAB — COMPREHENSIVE METABOLIC PANEL
ALBUMIN: 4.1 g/dL (ref 3.5–5.0)
ALK PHOS: 65 U/L (ref 38–126)
ALT: 33 U/L (ref 14–54)
ANION GAP: 6 (ref 5–15)
AST: 27 U/L (ref 15–41)
BUN: 15 mg/dL (ref 6–20)
CHLORIDE: 105 mmol/L (ref 101–111)
CO2: 29 mmol/L (ref 22–32)
Calcium: 9.4 mg/dL (ref 8.9–10.3)
Creatinine, Ser: 0.64 mg/dL (ref 0.44–1.00)
GFR calc Af Amer: >60 mL/min (ref >60)
GFR calc non Af Amer: >60 mL/min (ref >60)
GLUCOSE: 149 mg/dL — AB (ref 65–99)
POTASSIUM: 3.3 mmol/L — AB (ref 3.5–5.1)
SODIUM: 140 mmol/L (ref 135–145)
Total Bilirubin: 0.5 mg/dL (ref 0.3–1.2)
Total Protein: 7.5 g/dL (ref 6.5–8.1)

## 2016-01-01 MED ORDER — POTASSIUM CHLORIDE CRYS ER 20 MEQ PO TBCR
40.0000 meq | EXTENDED_RELEASE_TABLET | Freq: Once | ORAL | Status: AC
  Administered 2016-01-01: 40 meq via ORAL
  Filled 2016-01-01: qty 2

## 2016-01-01 NOTE — ED Provider Notes (Signed)
CSN: [id number]     Arrival date & time 01/01/16  1655 History   First MD Initiated Contact with Patient 01/01/16 1737     Chief Complaint  Patient presents with  . Shortness of Breath     (Consider location/radiation/quality/duration/timing/severity/associated sxs/prior Treatment) Patient is a 75 y.o. female presenting with shortness of breath. The history is provided by the patient and the spouse.  Shortness of Breath Associated symptoms: no abdominal pain, no chest pain, no cough, no fever, no headaches, no neck pain, no rash, no sore throat and no vomiting   Patient indicates has felt generally weak and mildly sob for the past couple days.  Symptoms gradual onset, persistent.  States feels like occurred with 2nd, recent admission related to uncontrolled blood pressure. No acute or abrupt change today. No unilateral numbness/weakness. No change in speech or vision. No fever or chills. Is eating/drinking. No trauma/fall. No syncope.  States compliant w normal meds. No vomiting or diarrhea. No dysuria.      Past Medical History  Diagnosis Date  . Hypertension     LIABLE  . Hypercholesterolemia   . Hypothyroidism   . Vitamin D deficiency   . Palpitations   . History of dizziness   . LVH (left ventricular hypertrophy)   . Aortic stenosis   . Mitral regurgitation   . SOB (shortness of breath)   . Difficulty walking   . Cerebrovascular accident (stroke) [facility])    Past Surgical History  Procedure Laterality Date  . Cardiovascular stress test  2002    NORMAL  . Transthoracic echocardiogram  2008    SHOWED LEFT VENTRICULAR HYPERTROPHY AND MILD AORTIC STENOSIS  . Ep implantable device N/A 12/17/2015    Procedure: Loop Recorder Insertion;  Surgeon: [name], MD;  Location: [facility];  Service: Cardiovascular;  Laterality: N/A;  . Tee without cardioversion N/A 12/17/2015    Procedure: TRANSESOPHAGEAL ECHOCARDIOGRAM (TEE);  Surgeon: Lelon Perla, MD;  Location: [facility]  ENDOSCOPY;  Service: Cardiovascular;  Laterality: N/A;  Pt also needs a LOOP   Family History  Problem Relation Age of Onset  . Heart attack Mother   . Alzheimer's disease Mother    Social History  Substance Use Topics  . Smoking status: Never Smoker   . Smokeless tobacco: None  . Alcohol Use: Yes   OB History    No data available     Review of Systems  Constitutional: Negative for fever and chills.  HENT: Negative for sore throat.   Eyes: Negative for visual disturbance.  Respiratory: Positive for shortness of breath. Negative for cough.   Cardiovascular: Negative for chest pain and leg swelling.  Gastrointestinal: Negative for vomiting, abdominal pain, diarrhea and blood in stool.  Genitourinary: Negative for dysuria and flank pain.  Musculoskeletal: Negative for back pain and neck pain.  Skin: Negative for rash.  Neurological: Negative for numbness and headaches.  Hematological: Does not bruise/bleed easily.  Psychiatric/Behavioral: Negative for confusion.      Allergies  Sulfa drugs cross reactors; Zocor; and Micardis  Home Medications   Prior to Admission medications   Medication Sig Start Date End Date Taking? Authorizing Provider  ALPRAZolam (XANAX) 0.25 MG tablet Take 1 tablet (0.25 mg total) by mouth at bedtime. 12/25/15   Ivan Anchors Love, PA-C  amLODipine (NORVASC) 2.5 MG tablet Take 2.5 mg by mouth daily. Reported on 12/27/2015 12/12/15   Historical Provider, MD  aspirin EC 325 MG EC tablet Take 1 tablet (325 mg total)  by mouth daily. 12/17/15   Lavina Hamman, MD  B Complex-C-Folic Acid (STRESS FORMULA) TABS Take 1 tablet by mouth daily.     Historical Provider, MD  carboxymethylcellulose (REFRESH PLUS) 0.5 % SOLN Place 1 drop into both eyes 3 (three) times daily as needed.    Historical Provider, MD  clopidogrel (PLAVIX) 75 MG tablet Take 1 tablet (75 mg total) by mouth daily. 12/24/15   Bary Leriche, PA-C  estradiol (ESTRACE) 0.1 MG/GM vaginal cream Place 2 g  vaginally as needed (dryness).     Historical Provider, MD  irbesartan (AVAPRO) 300 MG tablet Take 1 tablet (300 mg total) by mouth daily. 12/29/15   Velvet Bathe, MD  levothyroxine (SYNTHROID, LEVOTHROID) 25 MCG tablet Take 25 mcg by mouth daily before breakfast.  09/21/14   Historical Provider, MD  LOTEMAX 0.5 % ophthalmic suspension Place 1 drop into both eyes 4 (four) times daily as needed. For dry itchy eyes 02/17/13   Historical Provider, MD  metoprolol tartrate (LOPRESSOR) 25 MG tablet Take 0.5 tablets (12.5 mg total) by mouth 2 (two) times daily. 12/24/15   Bary Leriche, PA-C  Multiple Vitamin (MULTIVITAMIN WITH MINERALS) TABS tablet Take 1 tablet by mouth daily.    Historical Provider, MD  polyethylene glycol (MIRALAX / GLYCOLAX) packet Take 17 g by mouth daily. Patient taking differently: Take 17 g by mouth daily as needed for moderate constipation.  12/17/15   Lavina Hamman, MD   BP 182/75 mmHg  Pulse 89  Temp(Src) 98 F (36.7 C)  Resp 20  Ht 5\' 2"  (1.575 m)  Wt 52.617 kg  BMI 21.21 kg/m2  SpO2 99% Physical Exam  Constitutional: She is oriented to person, place, and time. She appears well-developed and well-nourished. No distress.  HENT:  Head: Atraumatic.  Mouth/Throat: Oropharynx is clear and moist.  Eyes: Conjunctivae are normal. Pupils are equal, round, and reactive to light. No scleral icterus.  Neck: Normal range of motion. Neck supple. No tracheal deviation present. No thyromegaly present.  No stiffness or rigidity. No bruits.   Cardiovascular: Normal rate, regular rhythm, normal heart sounds and intact distal pulses.   Systolic murmur. Soft.   Pulmonary/Chest: Effort normal and breath sounds normal. No respiratory distress.  Abdominal: Soft. Normal appearance and bowel sounds are normal. She exhibits no distension. There is no tenderness.  Genitourinary:  No cva tenderness  Musculoskeletal: She exhibits no edema.  Neurological: She is alert and oriented to person,  place, and time.  Speech clear/fluent. Motor intact bil, stre left 4+/5. sens grossly intact  Skin: Skin is warm and dry. No rash noted. She is not diaphoretic.  Psychiatric: She has a normal mood and affect.  Nursing note and vitals reviewed.   ED Course  Procedures (including critical care time) Labs Review  Results for orders placed or performed during the hospital encounter of 01/01/16  CBC  Result Value Ref Range   WBC 8.8 4.0 - 10.5 K/uL   RBC 4.29 3.87 - 5.11 MIL/uL   Hemoglobin 13.9 12.0 - 15.0 g/dL   HCT 41.0 36.0 - 46.0 %   MCV 95.6 78.0 - 100.0 fL   MCH 32.4 26.0 - 34.0 pg   MCHC 33.9 30.0 - 36.0 g/dL   RDW 12.6 11.5 - 15.5 %   Platelets 231 150 - 400 K/uL  Comprehensive metabolic panel  Result Value Ref Range   Sodium 140 135 - 145 mmol/L   Potassium 3.3 (L) 3.5 - 5.1 mmol/L  Chloride 105 101 - 111 mmol/L   CO2 29 22 - 32 mmol/L   Glucose, Bld 149 (H) 65 - 99 mg/dL   BUN 15 6 - 20 mg/dL   Creatinine, Ser 0.64 0.44 - 1.00 mg/dL   Calcium 9.4 8.9 - 10.3 mg/dL   Total Protein 7.5 6.5 - 8.1 g/dL   Albumin 4.1 3.5 - 5.0 g/dL   AST 27 15 - 41 U/L   ALT 33 14 - 54 U/L   Alkaline Phosphatase 65 38 - 126 U/L   Total Bilirubin 0.5 0.3 - 1.2 mg/dL   GFR calc non Af Amer >60 >60 mL/min   GFR calc Af Amer >60 >60 mL/min   Anion gap 6 5 - 15  Troponin I  Result Value Ref Range   Troponin I <0.03 <0.031 ng/mL  Urinalysis, Routine w reflex microscopic (not at [facility])  Result Value Ref Range   Color, Urine YELLOW YELLOW   APPearance CLEAR CLEAR   Specific Gravity, Urine 1.007 1.005 - 1.030   pH 7.0 5.0 - 8.0   Glucose, UA NEGATIVE NEGATIVE mg/dL   Hgb urine dipstick NEGATIVE NEGATIVE   Bilirubin Urine NEGATIVE NEGATIVE   Ketones, ur NEGATIVE NEGATIVE mg/dL   Protein, ur NEGATIVE NEGATIVE mg/dL   Nitrite NEGATIVE NEGATIVE   Leukocytes, UA NEGATIVE NEGATIVE   Dg Chest 2 View  01/01/2016  CLINICAL DATA:  Acute onset of shortness of breath. Initial encounter. EXAM:  CHEST  2 VIEW COMPARISON:  Chest radiograph performed 12/13/2015 FINDINGS: The lungs are well-aerated and clear. There is no evidence of focal opacification, pleural effusion or pneumothorax. The heart is mildly enlarged. A loop recorder is noted. No acute osseous abnormalities are seen. IMPRESSION: Mild cardiomegaly.  Lungs remain grossly clear. Electronically Signed   By: Garald Balding M.D.   On: 01/01/2016 18:54   Dg Chest 2 View  12/13/2015  CLINICAL DATA:  Dizziness and weakness.  Hypertension. EXAM: CHEST  2 VIEW COMPARISON:  None. FINDINGS: There is no edema or consolidation. Heart size and pulmonary vascularity are normal. There is atherosclerotic calcification in the aorta. No adenopathy. No bone lesions. IMPRESSION: No edema or consolidation. Electronically Signed   By: Lowella Grip III M.D.   On: 12/13/2015 10:43   Ct Head Wo Contrast  01/01/2016  CLINICAL DATA:  Shortness of breath. Lightheadedness. Weakness. Recent stroke 2 weeks ago. EXAM: CT HEAD WITHOUT CONTRAST TECHNIQUE: Contiguous axial images were obtained from the base of the skull through the vertex without intravenous contrast. COMPARISON:  12/28/2015 and 12/13/2015 FINDINGS: Small remote lacunar infarct in the left anterior thalamus. The previous infarct involving the right basal ganglia and right periventricular white matter shown on the MRI from 12/13/2015 is surprisingly indistinct on today' s exam, possibly from fogging. There is some slight thinning of the head of the right caudate and mild heterogeneity in the white matter and along the right lentiform nucleus, but otherwise the subacute infarct is fairly occult. No hemorrhagic transformation. Periventricular white matter and corona radiata hypodensities favor chronic ischemic microvascular white matter disease. No midline shift, hydrocephalus, intracranial hemorrhage, or mass lesion identified. IMPRESSION: 1. The known right basal ganglia and right periventricular white  matter infarct shown on 12/13/2015 is surprisingly indistinct on today's exam, with only very vague heterogeneity in this vicinity. No hemorrhagic transformation or acute complicating feature. 2. Old lacunar infarct of the left anterior thalamus. Chronic microvascular white matter disease. No new significant abnormality observed. Electronically Signed   By: Thayer Jew  Janeece Fitting M.D.   On: 01/01/2016 18:47   Ct Head Wo Contrast  12/28/2015  CLINICAL DATA:  Headache and left lower extremity weakness. Recent stroke. Slurred speech. EXAM: CT HEAD WITHOUT CONTRAST TECHNIQUE: Contiguous axial images were obtained from the base of the skull through the vertex without intravenous contrast. COMPARISON:  Head CT and brain MRI 12/13/2015 FINDINGS: Expected evolution of right basal gangliar infarct from prior MRI with increasing hypodensity. No hemorrhagic transformation, progression, or mass effect. Chronic small vessel ischemia or remote lacunar infarcts in the periventricular white matter, unchanged. Probable remote lacunar infarct in the left cerebellum. No intracranial hemorrhage or midline shift. No hydrocephalus. The basilar cisterns are patent. No evidence of territorial infarct. No intracranial fluid collection. Calvarium is intact. Included paranasal sinuses and mastoid air cells are well aerated. IMPRESSION: 1. Expected evolution of recent right basal ganglia infarct. No hemorrhagic transformation, increased size or mass effect. 2. Background atrophy and chronic small vessel ischemia, unchanged. No new abnormality is seen. Electronically Signed   By: Jeb Levering M.D.   On: 12/28/2015 01:55   Ct Head Wo Contrast  12/13/2015  CLINICAL DATA:  Onset of inability to walk this morning. Initial encounter. No known injury. EXAM: CT HEAD WITHOUT CONTRAST TECHNIQUE: Contiguous axial images were obtained from the base of the skull through the vertex without intravenous contrast. COMPARISON:  None. FINDINGS: There is  cortical atrophy and chronic microvascular ischemic change. No evidence of acute intracranial abnormality including hemorrhage, infarct, mass lesion, mass effect or abnormal extra-axial fluid collection is identified. No hydrocephalus or pneumocephalus. The calvarium is intact. Imaged paranasal sinuses and mastoid air cells are clear. IMPRESSION: No acute abnormality. Atrophy and chronic microvascular ischemic change. Electronically Signed   By: Inge Rise M.D.   On: 12/13/2015 10:41   Mr Brain Wo Contrast  12/13/2015  CLINICAL DATA:  75 year old female with slurred speech, difficulty walking and facial droop symptom onset today. Initial encounter. EXAM: MRI HEAD WITHOUT CONTRAST MRA HEAD WITHOUT CONTRAST TECHNIQUE: Multiplanar, multiecho pulse sequences of the brain and surrounding structures were obtained without intravenous contrast. Angiographic images of the head were obtained using MRA technique without contrast. COMPARISON:  Head CT without contrast 0955 hours today. Head and neck MRA a 04/19/2013. FINDINGS: MRI HEAD FINDINGS Major intracranial vascular flow voids are preserved. Confluent 3.5 cm area of restricted diffusion in the right basal ganglia. Minimal to mild associated T2 and FLAIR hyperintensity. No associated hemorrhage. Mild regional mass effect, no significant intracranial mass effect. There is a superimposed punctate area of periventricular white matter restricted diffusion at the atrium on the right (series 8, image 11). No contralateral or posterior fossa restricted diffusion. Superimposed small chronic lacunar infarcts in both cerebellar hemispheres an the thalamus. Increased perivascular spaces throughout the basal ganglia. Patchy mostly posterior hemisphere nonspecific cerebral white matter T2 and FLAIR hyperintensity. Some of these white matter changes ppm most resemble chronic lacunar infarcts. No supratentorial cortical encephalomalacia identified. There are scattered chronic  micro hemorrhages in the temporal lobes and cerebellum. No midline shift, evidence of mass lesion, ventriculomegaly, extra-axial collection or acute intracranial hemorrhage. Cervicomedullary junction and pituitary are within normal limits. Negative visualized cervical spine. Mildly prominent internal auditory canals with otherwise negative visualized internal auditory structures. Mastoids are clear. Paranasal sinuses are clear. Negative orbit and scalp soft tissues. Visualized bone marrow signal is within normal limits. MRA HEAD FINDINGS Stable antegrade flow in the posterior circulation with fairly codominant distal vertebral arteries. Both PICA origins remain patent. Vertebrobasilar junction  remains patent. Mild distal vertebral artery irregularity without stenosis appears stable. Mid basilar artery irregularity with up to mild stenosis has mildly progressed. SCA and PCA origins remain patent. There is mild to moderate stenosis in the right PCA P1 segment which is new. Bilateral PCA branches are stable. Posterior communicating arteries are diminutive or absent. Stable antegrade flow in both ICA siphons and distal cervical ICAs. Bilateral siphon atherosclerosis and irregularity. At least moderate stenosis just distal to the right ICA anterior genu appears stable (series 705, image 6). Normal ophthalmic artery origins. Patent carotid termini. The right MCA origin and M1 segment remain patent. Mild M1 irregularity has not significantly changed. Right MCA bifurcation remains patent. Visualized right MCA branches appear stable with mild distal M2 and M3 branch irregularity. No right MCA branch occlusion identified. Bilateral ACA and left MCA branches are stable with mild irregularity. IMPRESSION: 1. Confluent acute infarct in the right basal ganglia with no associated hemorrhage or mass effect. Superimposed punctate posterior right MCA periventricular white matter infarct. 2. Negative for emergent large vessel  occlusion. Anterior circulation atherosclerosis appears stable since 2014, including at least moderate stenosis of the supraclinoid right ICA (series 705, image 6). 3. Mild progression of posterior circulation atherosclerosis since 2014 including new mild stenosis of the mid basilar artery and moderate stenosis of the right PCA P1 segment. 4. Underlying chronic small vessel ischemia, moderate for age. Electronically Signed   By: Genevie Ann M.D.   On: 12/13/2015 14:33   Mr Jodene Nam Head/brain Wo Cm  12/13/2015  CLINICAL DATA:  75 year old female with slurred speech, difficulty walking and facial droop symptom onset today. Initial encounter. EXAM: MRI HEAD WITHOUT CONTRAST MRA HEAD WITHOUT CONTRAST TECHNIQUE: Multiplanar, multiecho pulse sequences of the brain and surrounding structures were obtained without intravenous contrast. Angiographic images of the head were obtained using MRA technique without contrast. COMPARISON:  Head CT without contrast 0955 hours today. Head and neck MRA a 04/19/2013. FINDINGS: MRI HEAD FINDINGS Major intracranial vascular flow voids are preserved. Confluent 3.5 cm area of restricted diffusion in the right basal ganglia. Minimal to mild associated T2 and FLAIR hyperintensity. No associated hemorrhage. Mild regional mass effect, no significant intracranial mass effect. There is a superimposed punctate area of periventricular white matter restricted diffusion at the atrium on the right (series 8, image 11). No contralateral or posterior fossa restricted diffusion. Superimposed small chronic lacunar infarcts in both cerebellar hemispheres an the thalamus. Increased perivascular spaces throughout the basal ganglia. Patchy mostly posterior hemisphere nonspecific cerebral white matter T2 and FLAIR hyperintensity. Some of these white matter changes ppm most resemble chronic lacunar infarcts. No supratentorial cortical encephalomalacia identified. There are scattered chronic micro hemorrhages in the  temporal lobes and cerebellum. No midline shift, evidence of mass lesion, ventriculomegaly, extra-axial collection or acute intracranial hemorrhage. Cervicomedullary junction and pituitary are within normal limits. Negative visualized cervical spine. Mildly prominent internal auditory canals with otherwise negative visualized internal auditory structures. Mastoids are clear. Paranasal sinuses are clear. Negative orbit and scalp soft tissues. Visualized bone marrow signal is within normal limits. MRA HEAD FINDINGS Stable antegrade flow in the posterior circulation with fairly codominant distal vertebral arteries. Both PICA origins remain patent. Vertebrobasilar junction remains patent. Mild distal vertebral artery irregularity without stenosis appears stable. Mid basilar artery irregularity with up to mild stenosis has mildly progressed. SCA and PCA origins remain patent. There is mild to moderate stenosis in the right PCA P1 segment which is new. Bilateral PCA branches are stable. Posterior communicating  arteries are diminutive or absent. Stable antegrade flow in both ICA siphons and distal cervical ICAs. Bilateral siphon atherosclerosis and irregularity. At least moderate stenosis just distal to the right ICA anterior genu appears stable (series 705, image 6). Normal ophthalmic artery origins. Patent carotid termini. The right MCA origin and M1 segment remain patent. Mild M1 irregularity has not significantly changed. Right MCA bifurcation remains patent. Visualized right MCA branches appear stable with mild distal M2 and M3 branch irregularity. No right MCA branch occlusion identified. Bilateral ACA and left MCA branches are stable with mild irregularity. IMPRESSION: 1. Confluent acute infarct in the right basal ganglia with no associated hemorrhage or mass effect. Superimposed punctate posterior right MCA periventricular white matter infarct. 2. Negative for emergent large vessel occlusion. Anterior circulation  atherosclerosis appears stable since 2014, including at least moderate stenosis of the supraclinoid right ICA (series 705, image 6). 3. Mild progression of posterior circulation atherosclerosis since 2014 including new mild stenosis of the mid basilar artery and moderate stenosis of the right PCA P1 segment. 4. Underlying chronic small vessel ischemia, moderate for age. Electronically Signed   By: Genevie Ann M.D.   On: 12/13/2015 14:33       I have personally reviewed and evaluated these images and lab results as part of my medical decision-making.   EKG Interpretation   Date/Time:  Wednesday Jan 01 2016 17:01:19 EDT Ventricular Rate:  89 PR Interval:  153 QRS Duration: 102 QT Interval:  379 QTC Calculation: 461 R Axis:   75 Text Interpretation:  Sinus rhythm Nonspecific ST abnormality Confirmed by  Ashok Cordia  MD, Lennette Bihari ([id number]) on 01/01/2016 5:45:37 PM      MDM   Iv ns. Labs.    Reviewed nursing notes and prior charts for additional history.   On record review, during recent admit restarted on norvasc, and also on avapro, ?relation to symptoms.  Patient also indicates she has hx anxiety and feels possible that related as well.    Symptoms present for past few days/constant, after which trop negative, and patient denies any chest pain or discomfort.  Ct neg acute.   cxr neg acute.  Labs unremarkable, x k sl low, kcl po.  Pt is ambulatory about ED, steady gait. No increased wob. No cp.   No new or worsening focal neurologic findings on exam.   Patient indicates feels improved.   rec close pcp f/u.  Return precautions provided.       Lajean Saver, MD 01/01/16 [id number]

## 2016-01-01 NOTE — ED Notes (Signed)
Pt c/o SOB at rest and exertion x 2 days , Recent admission for CVA

## 2016-01-01 NOTE — Progress Notes (Signed)
ILR wound check appointment. Steri-strips removed. Wound without redness or edema. Incision edges approximated, wound well healed. Battery status: good. R-waves 0.30mV.  No symptom, tachy, pause, brady, or AF episodes. Monthly summary reports and ROV with JA PRN.

## 2016-01-01 NOTE — ED Notes (Signed)
MD at bedside. 

## 2016-01-01 NOTE — ED Notes (Signed)
Assisted to restroom. Pt tolerated well. No SHOB noted.

## 2016-01-01 NOTE — Discharge Instructions (Signed)
It was our pleasure to provide your ER care today - we hope that you feel better.  Overall, your ED imaging and lab tests look good.  Your potassium level is slightly low today (3.3) - eat plenty of fruits and vegetables, and have level rechecked by your doctor in 1-2 weeks.  Consider supplementing nutrition with Ensure, Boost, or other nutritious shake as need.   Stay active, do your best with the physical therapy.  You were recent started on 2 blood pressure medications, and your blood pressure is high today - follow up with your doctor in the next few days for recheck, recheck of blood pressure, and discuss your medications then.  Return to ER if worse, new symptoms, fevers, chest pain, trouble breathing, one-sided numbness/weakness, change in speech or vision, other concern.      Weakness Weakness is a lack of strength. It may be felt all over the body (generalized) or in one specific part of the body (focal). Some causes of weakness can be serious. You may need further medical evaluation, especially if you are elderly or you have a history of immunosuppression (such as chemotherapy or HIV), kidney disease, heart disease, or diabetes. CAUSES  Weakness can be caused by many different things, including:  Infection.  Physical exhaustion.  Internal bleeding or other blood loss that results in a lack of red blood cells (anemia).  Dehydration. This cause is more common in elderly people.  Side effects or electrolyte abnormalities from medicines, such as pain medicines or sedatives.  Emotional distress, anxiety, or depression.  Circulation problems, especially severe peripheral arterial disease.  Heart disease, such as rapid atrial fibrillation, bradycardia, or heart failure.  Nervous system disorders, such as Guillain-Barr syndrome, multiple sclerosis, or stroke. DIAGNOSIS  To find the cause of your weakness, your caregiver will take your history and perform a physical exam.  Lab tests or X-rays may also be ordered, if needed. TREATMENT  Treatment of weakness depends on the cause of your symptoms and can vary greatly. HOME CARE INSTRUCTIONS   Rest as needed.  Eat a well-balanced diet.  Try to get some exercise every day.  Only take over-the-counter or prescription medicines as directed by your caregiver. SEEK MEDICAL CARE IF:   Your weakness seems to be getting worse or spreads to other parts of your body.  You develop new aches or pains. SEEK IMMEDIATE MEDICAL CARE IF:   You cannot perform your normal daily activities, such as getting dressed and feeding yourself.  You cannot walk up and down stairs, or you feel exhausted when you do so.  You have shortness of breath or chest pain.  You have difficulty moving parts of your body.  You have weakness in only one area of the body or on only one side of the body.  You have a fever.  You have trouble speaking or swallowing.  You cannot control your bladder or bowel movements.  You have black or bloody vomit or stools. MAKE SURE YOU:  Understand these instructions.  Will watch your condition.  Will get help right away if you are not doing well or get worse.   This information is not intended to replace advice given to you by your health care provider. Make sure you discuss any questions you have with your health care provider.   Document Released: 07/20/2005 Document Revised: 01/19/2012 Document Reviewed: 09/18/2011 Elsevier Interactive Patient Education 2016 Elsevier Inc.   Hypokalemia Hypokalemia means that the amount of potassium in the blood  is lower than normal.Potassium is a chemical, called an electrolyte, that helps regulate the amount of fluid in the body. It also stimulates muscle contraction and helps nerves function properly.Most of the body's potassium is inside of cells, and only a very small amount is in the blood. Because the amount in the blood is so small, minor changes  can be life-threatening. CAUSES  Antibiotics.  Diarrhea or vomiting.  Using laxatives too much, which can cause diarrhea.  Chronic kidney disease.  Water pills (diuretics).  Eating disorders (bulimia).  Low magnesium level.  Sweating a lot. SIGNS AND SYMPTOMS  Weakness.  Constipation.  Fatigue.  Muscle cramps.  Mental confusion.  Skipped heartbeats or irregular heartbeat (palpitations).  Tingling or numbness. DIAGNOSIS  Your health care provider can diagnose hypokalemia with blood tests. In addition to checking your potassium level, your health care provider may also check other lab tests. TREATMENT Hypokalemia can be treated with potassium supplements taken by mouth or adjustments in your current medicines. If your potassium level is very low, you may need to get potassium through a vein (IV) and be monitored in the hospital. A diet high in potassium is also helpful. Foods high in potassium are:  Nuts, such as peanuts and pistachios.  Seeds, such as sunflower seeds and pumpkin seeds.  Peas, lentils, and lima beans.  Whole grain and bran cereals and breads.  Fresh fruit and vegetables, such as apricots, avocado, bananas, cantaloupe, kiwi, oranges, tomatoes, asparagus, and potatoes.  Orange and tomato juices.  Red meats.  Fruit yogurt. HOME CARE INSTRUCTIONS  Take all medicines as prescribed by your health care provider.  Maintain a healthy diet by including nutritious food, such as fruits, vegetables, nuts, whole grains, and lean meats.  If you are taking a laxative, be sure to follow the directions on the label. SEEK MEDICAL CARE IF:  Your weakness gets worse.  You feel your heart pounding or racing.  You are vomiting or having diarrhea.  You are diabetic and having trouble keeping your blood glucose in the normal range. SEEK IMMEDIATE MEDICAL CARE IF:  You have chest pain, shortness of breath, or dizziness.  You are vomiting or having  diarrhea for more than 2 days.  You faint. MAKE SURE YOU:   Understand these instructions.  Will watch your condition.  Will get help right away if you are not doing well or get worse.   This information is not intended to replace advice given to you by your health care provider. Make sure you discuss any questions you have with your health care provider.   Document Released: 07/20/2005 Document Revised: 08/10/2014 Document Reviewed: 01/20/2013 Elsevier Interactive Patient Education 2016 [employer].  Hypertension Hypertension, commonly called high blood pressure, is when the force of blood pumping through your arteries is too strong. Your arteries are the blood vessels that carry blood from your heart throughout your body. A blood pressure reading consists of a higher number over a lower number, such as 110/72. The higher number (systolic) is the pressure inside your arteries when your heart pumps. The lower number (diastolic) is the pressure inside your arteries when your heart relaxes. Ideally you want your blood pressure below 120/80. Hypertension forces your heart to work harder to pump blood. Your arteries may become narrow or stiff. Having untreated or uncontrolled hypertension can cause heart attack, stroke, kidney disease, and other problems. RISK FACTORS Some risk factors for high blood pressure are controllable. Others are not.  Risk factors you cannot  control include:   Race. You may be at higher risk if you are African American.  Age. Risk increases with age.  Gender. Men are at higher risk than women before age 32 years. After age 70, women are at higher risk than men. Risk factors you can control include:  Not getting enough exercise or physical activity.  Being overweight.  Getting too much fat, sugar, calories, or salt in your diet.  Drinking too much alcohol. SIGNS AND SYMPTOMS Hypertension does not usually cause signs or symptoms. Extremely high blood  pressure (hypertensive crisis) may cause headache, anxiety, shortness of breath, and nosebleed. DIAGNOSIS To check if you have hypertension, your health care provider will measure your blood pressure while you are seated, with your arm held at the level of your heart. It should be measured at least twice using the same arm. Certain conditions can cause a difference in blood pressure between your right and left arms. A blood pressure reading that is higher than normal on one occasion does not mean that you need treatment. If it is not clear whether you have high blood pressure, you may be asked to return on a different day to have your blood pressure checked again. Or, you may be asked to monitor your blood pressure at home for 1 or more weeks. TREATMENT Treating high blood pressure includes making lifestyle changes and possibly taking medicine. Living a healthy lifestyle can help lower high blood pressure. You may need to change some of your habits. Lifestyle changes may include:  Following the DASH diet. This diet is high in fruits, vegetables, and whole grains. It is low in salt, red meat, and added sugars.  Keep your sodium intake below 2,300 mg per day.  Getting at least 30-45 minutes of aerobic exercise at least 4 times per week.  Losing weight if necessary.  Not smoking.  Limiting alcoholic beverages.  Learning ways to reduce stress. Your health care provider may prescribe medicine if lifestyle changes are not enough to get your blood pressure under control, and if one of the following is true:  You are 16-37 years of age and your systolic blood pressure is above 140.  You are 17 years of age or older, and your systolic blood pressure is above 150.  Your diastolic blood pressure is above 90.  You have diabetes, and your systolic blood pressure is over [id number] or your diastolic blood pressure is over 90.  You have kidney disease and your blood pressure is above 140/90.  You have  heart disease and your blood pressure is above 140/90. Your personal target blood pressure may vary depending on your medical conditions, your age, and other factors. HOME CARE INSTRUCTIONS  Have your blood pressure rechecked as directed by your health care provider.   Take medicines only as directed by your health care provider. Follow the directions carefully. Blood pressure medicines must be taken as prescribed. The medicine does not work as well when you skip doses. Skipping doses also puts you at risk for problems.  Do not smoke.   Monitor your blood pressure at home as directed by your health care provider. SEEK MEDICAL CARE IF:   You think you are having a reaction to medicines taken.  You have recurrent headaches or feel dizzy.  You have swelling in your ankles.  You have trouble with your vision. SEEK IMMEDIATE MEDICAL CARE IF:  You develop a severe headache or confusion.  You have unusual weakness, numbness, or feel faint.  You have severe chest or abdominal pain.  You vomit repeatedly.  You have trouble breathing. MAKE SURE YOU:   Understand these instructions.  Will watch your condition.  Will get help right away if you are not doing well or get worse.   This information is not intended to replace advice given to you by your health care provider. Make sure you discuss any questions you have with your health care provider.   Document Released: 07/20/2005 Document Revised: 12/04/2014 Document Reviewed: 05/12/2013 Elsevier Interactive Patient Education [employer].

## 2016-01-01 NOTE — ED Notes (Signed)
Pt ambulated on unit gait steady with assist of 1  Denies pain or SOB remains alert x3.

## 2016-01-02 DIAGNOSIS — F329 Major depressive disorder, single episode, unspecified: Secondary | ICD-10-CM | POA: Diagnosis not present

## 2016-01-02 DIAGNOSIS — I69328 Other speech and language deficits following cerebral infarction: Secondary | ICD-10-CM | POA: Diagnosis not present

## 2016-01-02 DIAGNOSIS — R5383 Other fatigue: Secondary | ICD-10-CM | POA: Diagnosis not present

## 2016-01-02 DIAGNOSIS — I350 Nonrheumatic aortic (valve) stenosis: Secondary | ICD-10-CM | POA: Diagnosis not present

## 2016-01-02 DIAGNOSIS — Z7982 Long term (current) use of aspirin: Secondary | ICD-10-CM | POA: Diagnosis not present

## 2016-01-02 DIAGNOSIS — Z7901 Long term (current) use of anticoagulants: Secondary | ICD-10-CM | POA: Diagnosis not present

## 2016-01-02 DIAGNOSIS — I69354 Hemiplegia and hemiparesis following cerebral infarction affecting left non-dominant side: Secondary | ICD-10-CM | POA: Diagnosis not present

## 2016-01-02 DIAGNOSIS — E039 Hypothyroidism, unspecified: Secondary | ICD-10-CM | POA: Diagnosis not present

## 2016-01-02 DIAGNOSIS — I10 Essential (primary) hypertension: Secondary | ICD-10-CM | POA: Diagnosis not present

## 2016-01-02 DIAGNOSIS — I639 Cerebral infarction, unspecified: Secondary | ICD-10-CM | POA: Diagnosis not present

## 2016-01-03 ENCOUNTER — Encounter: Payer: Self-pay | Admitting: Physical Medicine & Rehabilitation

## 2016-01-03 ENCOUNTER — Encounter: Payer: PPO | Attending: Physical Medicine & Rehabilitation | Admitting: Physical Medicine & Rehabilitation

## 2016-01-03 VITALS — BP 151/76 | HR 72 | Resp 14

## 2016-01-03 DIAGNOSIS — R0609 Other forms of dyspnea: Secondary | ICD-10-CM | POA: Diagnosis not present

## 2016-01-03 DIAGNOSIS — R410 Disorientation, unspecified: Secondary | ICD-10-CM | POA: Diagnosis not present

## 2016-01-03 DIAGNOSIS — F419 Anxiety disorder, unspecified: Secondary | ICD-10-CM | POA: Insufficient documentation

## 2016-01-03 DIAGNOSIS — Z7901 Long term (current) use of anticoagulants: Secondary | ICD-10-CM | POA: Diagnosis not present

## 2016-01-03 DIAGNOSIS — I340 Nonrheumatic mitral (valve) insufficiency: Secondary | ICD-10-CM | POA: Insufficient documentation

## 2016-01-03 DIAGNOSIS — F411 Generalized anxiety disorder: Secondary | ICD-10-CM

## 2016-01-03 DIAGNOSIS — E785 Hyperlipidemia, unspecified: Secondary | ICD-10-CM | POA: Diagnosis not present

## 2016-01-03 DIAGNOSIS — Z7982 Long term (current) use of aspirin: Secondary | ICD-10-CM | POA: Diagnosis not present

## 2016-01-03 DIAGNOSIS — I10 Essential (primary) hypertension: Secondary | ICD-10-CM | POA: Diagnosis not present

## 2016-01-03 DIAGNOSIS — I517 Cardiomegaly: Secondary | ICD-10-CM | POA: Insufficient documentation

## 2016-01-03 DIAGNOSIS — I69328 Other speech and language deficits following cerebral infarction: Secondary | ICD-10-CM | POA: Diagnosis not present

## 2016-01-03 DIAGNOSIS — E039 Hypothyroidism, unspecified: Secondary | ICD-10-CM | POA: Diagnosis not present

## 2016-01-03 DIAGNOSIS — E876 Hypokalemia: Secondary | ICD-10-CM | POA: Diagnosis not present

## 2016-01-03 DIAGNOSIS — R471 Dysarthria and anarthria: Secondary | ICD-10-CM | POA: Insufficient documentation

## 2016-01-03 DIAGNOSIS — E559 Vitamin D deficiency, unspecified: Secondary | ICD-10-CM | POA: Diagnosis not present

## 2016-01-03 DIAGNOSIS — I69354 Hemiplegia and hemiparesis following cerebral infarction affecting left non-dominant side: Secondary | ICD-10-CM | POA: Insufficient documentation

## 2016-01-03 DIAGNOSIS — I69359 Hemiplegia and hemiparesis following cerebral infarction affecting unspecified side: Secondary | ICD-10-CM | POA: Diagnosis not present

## 2016-01-03 DIAGNOSIS — I350 Nonrheumatic aortic (valve) stenosis: Secondary | ICD-10-CM | POA: Diagnosis not present

## 2016-01-03 DIAGNOSIS — R262 Difficulty in walking, not elsewhere classified: Secondary | ICD-10-CM | POA: Insufficient documentation

## 2016-01-03 DIAGNOSIS — E7800 Pure hypercholesterolemia, unspecified: Secondary | ICD-10-CM | POA: Insufficient documentation

## 2016-01-03 NOTE — Progress Notes (Addendum)
Subjective:    Patient ID: Stacey Ramirez, female    DOB: 09-Sep-1940, 75 y.o.   MRN: [id number]  HPI  75 y.o. female with history of HTN, aortic stenosis, L-CVA 2014 with gait disorder and minimal RLE weakness who was admitted on 12/13/15 with left sided weakness and dysarthric speech presents for transitional care management after being discharged from CIR after acute infarct in right basal ganglia. Admit date: 12/17/2015 Discharge date: 12/25/2015 Since discharge, she states she had to go back to the hospital twice for uncontrolled BP and SOB.  She continues to have some nausea and fatigue.  She is not driving.  She saw her PCP yesterday, who is making adjustments to her medications. She is currently on crestor, which makes her weak and is in the process of being transitioned.  She has a scheduled appointment with Neurology. She saw Cardiology.  Denies falls.   DME: Shower chair Therapies: Daily between all the therapies Mobility: None needed  Pain Inventory Average Pain 0 Pain Right Now 0 My pain is NA  In the last 24 hours, has pain interfered with the following? General activity 0 Relation with others 0 Enjoyment of life 0 What TIME of day is your pain at its worst? NA Sleep (in general) Good  Pain is worse with: NA Pain improves with: NA Relief from Meds: 0  Mobility walk without assistance how many minutes can you walk? 15 ability to climb steps?  yes do you drive?  no needs help with transfers Do you have any goals in this area?  yes  Function retired I need assistance with the following:  dressing, bathing and household duties  Neuro/Psych bladder control problems weakness confusion depression anxiety  Prior Studies bone scan x-rays CT/MRI  Physicians involved in your care Primary care NA Neurologist NA   Family History  Problem Relation Age of Onset  . Heart attack Mother   . Alzheimer's disease Mother    Social History   Social History  .  Marital Status: Married    Spouse Name: N/A  . Number of Children: 2  . Years of Education: college   Occupational History  . retired    Social History Main Topics  . Smoking status: Never Smoker   . Smokeless tobacco: None  . Alcohol Use: Yes  . Drug Use: No  . Sexual Activity: Not Asked   Other Topics Concern  . None   Social History Narrative   Past Surgical History  Procedure Laterality Date  . Cardiovascular stress test  2002    NORMAL  . Transthoracic echocardiogram  2008    SHOWED LEFT VENTRICULAR HYPERTROPHY AND MILD AORTIC STENOSIS  . Ep implantable device N/A 12/17/2015    Procedure: Loop Recorder Insertion;  Surgeon: [name], MD;  Location: [facility];  Service: Cardiovascular;  Laterality: N/A;  . Tee without cardioversion N/A 12/17/2015    Procedure: TRANSESOPHAGEAL ECHOCARDIOGRAM (TEE);  Surgeon: Lelon Perla, MD;  Location: [facility] END[facility]COPY;  Service: Cardiovascular;  Laterality: N/A;  Pt also needs a LOOP   Past Medical History  Diagnosis Date  . Hypertension     LIABLE  . Hypercholesterolemia   . Hypothyroidism   . Vitamin D deficiency   . Palpitations   . History of dizziness   . LVH (left ventricular hypertrophy)   . Aortic stenosis   . Mitral regurgitation   . SOB (shortness of breath)   . Difficulty walking   . Cerebrovascular accident (stroke) (  HCC)    BP 151/76 mmHg  Pulse 72  Resp 14  SpO2 99%  Opioid Risk Score:   Fall Risk Score:  `1  Depression screen PHQ 2/9  Depression screen PHQ 2/9 01/03/2016  Decreased Interest 3  Down, Depressed, Hopeless 2  PHQ - 2 Score 5  Altered sleeping 1  Tired, decreased energy 3  Change in appetite 1  Feeling bad or failure about yourself  2  Trouble concentrating 1  Moving slowly or fidgety/restless 2  Suicidal thoughts 0  PHQ-9 Score 15   Review of Systems  Gastrointestinal: Positive for nausea.  Neurological: Positive for weakness. Negative for dizziness, speech difficulty and  numbness.  Hematological: Bruises/bleeds easily.  All other systems reviewed and are negative.     Objective:   Physical Exam  Gen: Feels "woozy".  NAD.   Head: Normocephalic and atraumatic.  Eyes: Conjunctivae and EOM are normal.  Cardiovascular: Normal rate and regular rhythm.  Exam reveals no friction rub. Murmur heard. Respiratory: Effort normal and breath sounds normal. No respiratory distress. She has no wheezes. GI: Bowel sounds are normal. She exhibits no distension. There is no tenderness.  Musculoskeletal: She exhibits no edema or tenderness.  Neurological: She is alert and oriented to person, place, and time.  Motor:  Right upper extremity: 5/5 proximal distal  RLE: 5/5HF,KE and 4+/5 ADF/PF Left upper; 4+/5 deltoid, bicep, tricep, wrist, hand Left lower extremity and 4+/5 HF, 4+/5KE 4 ADF/PF DTRs symmetric, hyperreflexic throughout.  No sensory deficits Sensation intact light touch    Skin: Skin is warm and dry. No rash noted. No erythema.  Psychiatric: remains anxious but pleasant. Often apologizes for her anxiety      Assessment & Plan:  75 y.o. female with history of HTN, aortic stenosis, L-CVA 2014 with gait disorder and minimal RLE weakness who was admitted on 12/13/15 with left sided weakness and dysarthric speech presents for transitional care management after being discharged from CIR after acute infarct in right basal ganglia.  1.  Late effect of right basal ganglia infarct             Continue therapies  Cont meds   Follow up with Neurology  2. Anxiety  Cont xanax to 0.125mg  qHS            3. HLD  Cont meds, adjustments being made by PCP  4. Hypothyroid  Cont meds  5. HTN  Follow closely due to recent admission for SBP >220.  Cont meds  Adjustments being made by PCP  6. DOE  Worked up in ED  Likely related to anxiety   Meds reviewed Referrals reviewed All questions answered

## 2016-01-06 DIAGNOSIS — Z7982 Long term (current) use of aspirin: Secondary | ICD-10-CM | POA: Diagnosis not present

## 2016-01-06 DIAGNOSIS — Z7901 Long term (current) use of anticoagulants: Secondary | ICD-10-CM | POA: Diagnosis not present

## 2016-01-06 DIAGNOSIS — I69354 Hemiplegia and hemiparesis following cerebral infarction affecting left non-dominant side: Secondary | ICD-10-CM | POA: Diagnosis not present

## 2016-01-06 DIAGNOSIS — I10 Essential (primary) hypertension: Secondary | ICD-10-CM | POA: Diagnosis not present

## 2016-01-06 DIAGNOSIS — I69328 Other speech and language deficits following cerebral infarction: Secondary | ICD-10-CM | POA: Diagnosis not present

## 2016-01-06 DIAGNOSIS — E039 Hypothyroidism, unspecified: Secondary | ICD-10-CM | POA: Diagnosis not present

## 2016-01-06 DIAGNOSIS — I350 Nonrheumatic aortic (valve) stenosis: Secondary | ICD-10-CM | POA: Diagnosis not present

## 2016-01-07 DIAGNOSIS — I69328 Other speech and language deficits following cerebral infarction: Secondary | ICD-10-CM | POA: Diagnosis not present

## 2016-01-07 DIAGNOSIS — I10 Essential (primary) hypertension: Secondary | ICD-10-CM | POA: Diagnosis not present

## 2016-01-07 DIAGNOSIS — I350 Nonrheumatic aortic (valve) stenosis: Secondary | ICD-10-CM | POA: Diagnosis not present

## 2016-01-07 DIAGNOSIS — E039 Hypothyroidism, unspecified: Secondary | ICD-10-CM | POA: Diagnosis not present

## 2016-01-07 DIAGNOSIS — I69354 Hemiplegia and hemiparesis following cerebral infarction affecting left non-dominant side: Secondary | ICD-10-CM | POA: Diagnosis not present

## 2016-01-07 DIAGNOSIS — Z7901 Long term (current) use of anticoagulants: Secondary | ICD-10-CM | POA: Diagnosis not present

## 2016-01-07 DIAGNOSIS — Z7982 Long term (current) use of aspirin: Secondary | ICD-10-CM | POA: Diagnosis not present

## 2016-01-08 DIAGNOSIS — Z7901 Long term (current) use of anticoagulants: Secondary | ICD-10-CM | POA: Diagnosis not present

## 2016-01-08 DIAGNOSIS — I69354 Hemiplegia and hemiparesis following cerebral infarction affecting left non-dominant side: Secondary | ICD-10-CM | POA: Diagnosis not present

## 2016-01-08 DIAGNOSIS — Z7982 Long term (current) use of aspirin: Secondary | ICD-10-CM | POA: Diagnosis not present

## 2016-01-08 DIAGNOSIS — I10 Essential (primary) hypertension: Secondary | ICD-10-CM | POA: Diagnosis not present

## 2016-01-08 DIAGNOSIS — I69328 Other speech and language deficits following cerebral infarction: Secondary | ICD-10-CM | POA: Diagnosis not present

## 2016-01-08 DIAGNOSIS — E039 Hypothyroidism, unspecified: Secondary | ICD-10-CM | POA: Diagnosis not present

## 2016-01-08 DIAGNOSIS — I350 Nonrheumatic aortic (valve) stenosis: Secondary | ICD-10-CM | POA: Diagnosis not present

## 2016-01-09 DIAGNOSIS — Z7982 Long term (current) use of aspirin: Secondary | ICD-10-CM | POA: Diagnosis not present

## 2016-01-09 DIAGNOSIS — I69354 Hemiplegia and hemiparesis following cerebral infarction affecting left non-dominant side: Secondary | ICD-10-CM | POA: Diagnosis not present

## 2016-01-09 DIAGNOSIS — I350 Nonrheumatic aortic (valve) stenosis: Secondary | ICD-10-CM | POA: Diagnosis not present

## 2016-01-09 DIAGNOSIS — I10 Essential (primary) hypertension: Secondary | ICD-10-CM | POA: Diagnosis not present

## 2016-01-09 DIAGNOSIS — I69328 Other speech and language deficits following cerebral infarction: Secondary | ICD-10-CM | POA: Diagnosis not present

## 2016-01-09 DIAGNOSIS — Z7901 Long term (current) use of anticoagulants: Secondary | ICD-10-CM | POA: Diagnosis not present

## 2016-01-09 DIAGNOSIS — E039 Hypothyroidism, unspecified: Secondary | ICD-10-CM | POA: Diagnosis not present

## 2016-01-13 DIAGNOSIS — R269 Unspecified abnormalities of gait and mobility: Secondary | ICD-10-CM | POA: Diagnosis not present

## 2016-01-14 ENCOUNTER — Telehealth: Payer: Self-pay

## 2016-01-14 DIAGNOSIS — Z7982 Long term (current) use of aspirin: Secondary | ICD-10-CM | POA: Diagnosis not present

## 2016-01-14 DIAGNOSIS — E039 Hypothyroidism, unspecified: Secondary | ICD-10-CM | POA: Diagnosis not present

## 2016-01-14 DIAGNOSIS — I350 Nonrheumatic aortic (valve) stenosis: Secondary | ICD-10-CM | POA: Diagnosis not present

## 2016-01-14 DIAGNOSIS — I69328 Other speech and language deficits following cerebral infarction: Secondary | ICD-10-CM | POA: Diagnosis not present

## 2016-01-14 DIAGNOSIS — Z7901 Long term (current) use of anticoagulants: Secondary | ICD-10-CM | POA: Diagnosis not present

## 2016-01-14 DIAGNOSIS — I69354 Hemiplegia and hemiparesis following cerebral infarction affecting left non-dominant side: Secondary | ICD-10-CM | POA: Diagnosis not present

## 2016-01-14 DIAGNOSIS — I10 Essential (primary) hypertension: Secondary | ICD-10-CM | POA: Diagnosis not present

## 2016-01-14 NOTE — Telephone Encounter (Signed)
FYI-Pt's husband called to check in regarding Stacey Ramirez. Pt has been hospitalized due to her BP being 200/100, according to her husband. She had to stay in the hospital for a few days. Husband believes this has set her back with PT. She is having a harder time walking. PT is going to continue until the end of the month now. I advised pt's husband to follow the PT plan and be patient with recovery.

## 2016-01-15 DIAGNOSIS — Z7982 Long term (current) use of aspirin: Secondary | ICD-10-CM | POA: Diagnosis not present

## 2016-01-15 DIAGNOSIS — I10 Essential (primary) hypertension: Secondary | ICD-10-CM | POA: Diagnosis not present

## 2016-01-15 DIAGNOSIS — Z7901 Long term (current) use of anticoagulants: Secondary | ICD-10-CM | POA: Diagnosis not present

## 2016-01-15 DIAGNOSIS — I69354 Hemiplegia and hemiparesis following cerebral infarction affecting left non-dominant side: Secondary | ICD-10-CM | POA: Diagnosis not present

## 2016-01-15 DIAGNOSIS — I69328 Other speech and language deficits following cerebral infarction: Secondary | ICD-10-CM | POA: Diagnosis not present

## 2016-01-15 DIAGNOSIS — I350 Nonrheumatic aortic (valve) stenosis: Secondary | ICD-10-CM | POA: Diagnosis not present

## 2016-01-15 DIAGNOSIS — E039 Hypothyroidism, unspecified: Secondary | ICD-10-CM | POA: Diagnosis not present

## 2016-01-16 ENCOUNTER — Ambulatory Visit (INDEPENDENT_AMBULATORY_CARE_PROVIDER_SITE_OTHER): Payer: PPO | Admitting: *Deleted

## 2016-01-16 DIAGNOSIS — Z7982 Long term (current) use of aspirin: Secondary | ICD-10-CM | POA: Diagnosis not present

## 2016-01-16 DIAGNOSIS — I6350 Cerebral infarction due to unspecified occlusion or stenosis of unspecified cerebral artery: Secondary | ICD-10-CM

## 2016-01-16 DIAGNOSIS — E039 Hypothyroidism, unspecified: Secondary | ICD-10-CM | POA: Diagnosis not present

## 2016-01-16 DIAGNOSIS — I10 Essential (primary) hypertension: Secondary | ICD-10-CM | POA: Diagnosis not present

## 2016-01-16 DIAGNOSIS — I69328 Other speech and language deficits following cerebral infarction: Secondary | ICD-10-CM | POA: Diagnosis not present

## 2016-01-16 DIAGNOSIS — I69354 Hemiplegia and hemiparesis following cerebral infarction affecting left non-dominant side: Secondary | ICD-10-CM | POA: Diagnosis not present

## 2016-01-16 DIAGNOSIS — I350 Nonrheumatic aortic (valve) stenosis: Secondary | ICD-10-CM | POA: Diagnosis not present

## 2016-01-16 DIAGNOSIS — Z7901 Long term (current) use of anticoagulants: Secondary | ICD-10-CM | POA: Diagnosis not present

## 2016-01-20 DIAGNOSIS — E039 Hypothyroidism, unspecified: Secondary | ICD-10-CM | POA: Diagnosis not present

## 2016-01-20 DIAGNOSIS — I69354 Hemiplegia and hemiparesis following cerebral infarction affecting left non-dominant side: Secondary | ICD-10-CM | POA: Diagnosis not present

## 2016-01-20 DIAGNOSIS — Z7982 Long term (current) use of aspirin: Secondary | ICD-10-CM | POA: Diagnosis not present

## 2016-01-20 DIAGNOSIS — I69328 Other speech and language deficits following cerebral infarction: Secondary | ICD-10-CM | POA: Diagnosis not present

## 2016-01-20 DIAGNOSIS — I10 Essential (primary) hypertension: Secondary | ICD-10-CM | POA: Diagnosis not present

## 2016-01-20 DIAGNOSIS — I350 Nonrheumatic aortic (valve) stenosis: Secondary | ICD-10-CM | POA: Diagnosis not present

## 2016-01-20 DIAGNOSIS — Z7901 Long term (current) use of anticoagulants: Secondary | ICD-10-CM | POA: Diagnosis not present

## 2016-01-20 NOTE — Progress Notes (Signed)
Carelink Summary Report / Loop Recorder 

## 2016-01-21 ENCOUNTER — Other Ambulatory Visit: Payer: Self-pay | Admitting: *Deleted

## 2016-01-21 DIAGNOSIS — Z7901 Long term (current) use of anticoagulants: Secondary | ICD-10-CM | POA: Diagnosis not present

## 2016-01-21 DIAGNOSIS — E039 Hypothyroidism, unspecified: Secondary | ICD-10-CM | POA: Diagnosis not present

## 2016-01-21 DIAGNOSIS — I69354 Hemiplegia and hemiparesis following cerebral infarction affecting left non-dominant side: Secondary | ICD-10-CM | POA: Diagnosis not present

## 2016-01-21 DIAGNOSIS — I69328 Other speech and language deficits following cerebral infarction: Secondary | ICD-10-CM | POA: Diagnosis not present

## 2016-01-21 DIAGNOSIS — I10 Essential (primary) hypertension: Secondary | ICD-10-CM | POA: Diagnosis not present

## 2016-01-21 DIAGNOSIS — I350 Nonrheumatic aortic (valve) stenosis: Secondary | ICD-10-CM | POA: Diagnosis not present

## 2016-01-21 DIAGNOSIS — Z7982 Long term (current) use of aspirin: Secondary | ICD-10-CM | POA: Diagnosis not present

## 2016-01-21 NOTE — Patient Outreach (Signed)
[facility]) Care Management  01/21/2016  Maydell Grinstead Brogan February 23, 1941 [id number]  Referral per [facility] Care Management:  Telephone call to patient: left message on voice mail requesting call back.  Plan: Will follow up tomorrow.  Sherrin Daisy, RN BSN [place] Management Coordinator [facility] Care Management  [phone]

## 2016-01-22 ENCOUNTER — Other Ambulatory Visit: Payer: Self-pay | Admitting: *Deleted

## 2016-01-22 DIAGNOSIS — E039 Hypothyroidism, unspecified: Secondary | ICD-10-CM | POA: Diagnosis not present

## 2016-01-22 DIAGNOSIS — I350 Nonrheumatic aortic (valve) stenosis: Secondary | ICD-10-CM | POA: Diagnosis not present

## 2016-01-22 DIAGNOSIS — I10 Essential (primary) hypertension: Secondary | ICD-10-CM | POA: Diagnosis not present

## 2016-01-22 DIAGNOSIS — I69328 Other speech and language deficits following cerebral infarction: Secondary | ICD-10-CM | POA: Diagnosis not present

## 2016-01-22 DIAGNOSIS — I69354 Hemiplegia and hemiparesis following cerebral infarction affecting left non-dominant side: Secondary | ICD-10-CM | POA: Diagnosis not present

## 2016-01-22 DIAGNOSIS — Z7982 Long term (current) use of aspirin: Secondary | ICD-10-CM | POA: Diagnosis not present

## 2016-01-22 DIAGNOSIS — Z7901 Long term (current) use of anticoagulants: Secondary | ICD-10-CM | POA: Diagnosis not present

## 2016-01-22 NOTE — Patient Outreach (Signed)
[facility]) Care Management  01/22/2016  Stacey Ramirez [birth year] [id number]  Received incoming voice mail from patient's spouse.  Telephone call to spouse; left message on voice mail requesting call back.  Plan: Will follow up  Sherrin Daisy, RN BSN [place] Management Coordinator [facility] Care Management  [phone]  .

## 2016-01-23 ENCOUNTER — Other Ambulatory Visit: Payer: PPO | Admitting: *Deleted

## 2016-01-23 ENCOUNTER — Ambulatory Visit (INDEPENDENT_AMBULATORY_CARE_PROVIDER_SITE_OTHER): Payer: PPO | Admitting: Nurse Practitioner

## 2016-01-23 ENCOUNTER — Encounter: Payer: Self-pay | Admitting: Nurse Practitioner

## 2016-01-23 VITALS — BP 128/84 | HR 76 | Ht 62.0 in | Wt 111.4 lb

## 2016-01-23 DIAGNOSIS — I10 Essential (primary) hypertension: Secondary | ICD-10-CM

## 2016-01-23 DIAGNOSIS — R002 Palpitations: Secondary | ICD-10-CM

## 2016-01-23 DIAGNOSIS — I6322 Cerebral infarction due to unspecified occlusion or stenosis of basilar arteries: Secondary | ICD-10-CM

## 2016-01-23 DIAGNOSIS — I69328 Other speech and language deficits following cerebral infarction: Secondary | ICD-10-CM | POA: Diagnosis not present

## 2016-01-23 DIAGNOSIS — Z7982 Long term (current) use of aspirin: Secondary | ICD-10-CM | POA: Diagnosis not present

## 2016-01-23 DIAGNOSIS — E039 Hypothyroidism, unspecified: Secondary | ICD-10-CM | POA: Diagnosis not present

## 2016-01-23 DIAGNOSIS — I69354 Hemiplegia and hemiparesis following cerebral infarction affecting left non-dominant side: Secondary | ICD-10-CM | POA: Diagnosis not present

## 2016-01-23 DIAGNOSIS — I63231 Cerebral infarction due to unspecified occlusion or stenosis of right carotid arteries: Secondary | ICD-10-CM | POA: Insufficient documentation

## 2016-01-23 DIAGNOSIS — I639 Cerebral infarction, unspecified: Secondary | ICD-10-CM | POA: Diagnosis not present

## 2016-01-23 DIAGNOSIS — Z7901 Long term (current) use of anticoagulants: Secondary | ICD-10-CM | POA: Diagnosis not present

## 2016-01-23 DIAGNOSIS — I350 Nonrheumatic aortic (valve) stenosis: Secondary | ICD-10-CM | POA: Diagnosis not present

## 2016-01-23 MED ORDER — AMLODIPINE BESYLATE 10 MG PO TABS: 10.0000 mg | ORAL_TABLET | Freq: Two times a day (BID) | ORAL | Status: DC

## 2016-01-23 NOTE — Patient Instructions (Signed)
Medication Instructions:   INCREASE AMLODIPINE TO 10 MG ONCE DAILY= 4 OF THE 2.5 MG TABLETS ONCE DAILY   Follow-Up:  Your physician recommends that you schedule a follow-up appointment in: WITH DR Acie Fredrickson IN 3 MONTHS

## 2016-01-23 NOTE — Patient Outreach (Signed)
[facility]) Care Management  01/23/2016  Stacey Ramirez 23-May-1941 [id number]  Telephone call to patient; spouse answered call & was advised of reason for call & of [facility] care management services.  Voices that patient was recently hospitalized from 5/12-5/16 at [facility] with Stroke diagnosis. States she transitioned to inpatient rehabilitation floor at [facility] from 5/16 -5/24 and was discharged to home. Readmitted 5/26-5/28/2017 with elevated blood pressure.    Subjective:  RN CM spoke with patient who gave HIPPA verification.  Patient states this is her second stroke and that 1st stroke occurred 2 1/2 years ago.  States remnants of current stroke are left side weakness, trouble walking and some trouble remembering things. States spouse is caregiver and managing her medications. States she was on Crestor but had to taken off because it caused weakness in legs.   Has no trouble obtaining medication or getting to doctor's appointments.  Patient is able to voice stroke symptoms and knows to call 911 or have spouse call.   Caregiver agrees to [facility] care management services.   Objective:  See screening & transition of care assessments as noted. See medication list as reviewed and noted.  Outpatient Encounter Prescriptions as of 01/23/2016  Medication Sig Note  . ALPRAZolam (XANAX) 0.25 MG tablet Take 1 tablet (0.25 mg total) by mouth at bedtime.   Marland Kitchen amLODipine (NORVASC) 10 MG tablet Take 1 tablet (10 mg total) by mouth 2 (two) times daily. Reported on 12/27/2015 01/24/2016: Per spouse, patient is taking 1 tablet 10 mg once daily  . aspirin EC 325 MG EC tablet Take 1 tablet (325 mg total) by mouth daily.   . carboxymethylcellulose (REFRESH PLUS) 0.5 % SOLN Place 1 drop into both eyes 3 (three) times daily as needed.   . clopidogrel (PLAVIX) 75 MG tablet Take 1 tablet (75 mg total) by mouth daily.   Marland Kitchen estradiol (ESTRACE) 0.1 MG/GM vaginal cream Place 2 g vaginally as needed  (dryness).    . irbesartan (AVAPRO) 300 MG tablet Take 1 tablet (300 mg total) by mouth daily.   Marland Kitchen levothyroxine (SYNTHROID, LEVOTHROID) 25 MCG tablet Take 25 mcg by mouth daily before breakfast.    . LOTEMAX 0.5 % ophthalmic suspension Place 1 drop into both eyes 4 (four) times daily as needed. For dry itchy eyes   . metoprolol tartrate (LOPRESSOR) 25 MG tablet Take 0.5 tablets (12.5 mg total) by mouth 2 (two) times daily. 01/24/2016: Per spouse ; will stop taking tomorrow as instructed by MD.    . Multiple Vitamin (MULTIVITAMIN WITH MINERALS) TABS tablet Take 1 tablet by mouth daily.   . polyethylene glycol (MIRALAX / GLYCOLAX) packet Take 17 g by mouth daily. (Patient taking differently: Take 17 g by mouth daily as needed for moderate constipation. )   . sertraline (ZOLOFT) 50 MG tablet Take 50 mg by mouth daily.    . B Complex-C-Folic Acid (STRESS FORMULA) TABS Take 1 tablet by mouth daily. Reported on 01/24/2016    No facility-administered encounter medications on file as of 01/23/2016.   Assessments:  Recent hospital admission with diagnosis CVA with discharge 12/17/2015. Inpatient rehabilitation stay from 05/16-05/24. Readmission 05/26-28/2017 with accelerated blood pressure. Spouse is caregiver.  Patient is appropriate for transition of care program.   Plan: Notify community care coordinator of referral of patient for complex case management for recent hospital discharge with stroke diagnosis. Place order for referral.   Sherrin Daisy, RN BSN [facility] Care Management Coordinator [facility] Care Management  [phone]    

## 2016-01-23 NOTE — Progress Notes (Signed)
Office Visit    Patient Name: Stacey Ramirez Date of Encounter: 01/23/2016  Primary Care Provider:  Horatio Pel, MD Primary Cardiologist:  Vaughan Browner, MD  P. Nahser, MD   Chief Complaint    75 year old female with a history of palpitations, hypertension, hyperlipidemia, and recent stroke who presents for follow-up.  Past Medical History    Past Medical History  Diagnosis Date  . Essential hypertension   . Hypercholesterolemia   . Hypothyroidism   . Vitamin D deficiency   . Palpitations   . History of dizziness   . Mitral regurgitation     a. 12/2015 Echo: EF 60-65%, mild focal basal hypertrophy. No rwma, triv AI, mild MR, mildly dil LA w/o thrombus. No RA thrombus. No PFO.  Marland Kitchen Difficulty walking   . Cerebrovascular accident (stroke) ([place])     a. 12/2015 - cryptogenic.  S/P MDT Linq.   Past Surgical History  Procedure Laterality Date  . Cardiovascular stress test  2002    NORMAL  . Transthoracic echocardiogram  2008    SHOWED LEFT VENTRICULAR HYPERTROPHY AND MILD AORTIC STENOSIS  . Ep implantable device N/A 12/17/2015    Procedure: Loop Recorder Insertion;  Surgeon: [name], MD;  Location: [place] [facility];  Service: Cardiovascular;  Laterality: N/A;  . Tee without cardioversion N/A 12/17/2015    Procedure: TRANSESOPHAGEAL ECHOCARDIOGRAM (TEE);  Surgeon: Lelon Perla, MD;  Location: [facility] ENDOSCOPY;  Service: Cardiovascular;  Laterality: N/A;  Pt also needs a LOOP    Allergies  Allergies  Allergen Reactions  . Sulfa Drugs Cross Reactors Itching  . Zocor [Simvastatin] Other (See Comments)    Feels like she is going to pass out, weakness  . Crestor [Rosuvastatin Calcium]     Muscle weakness  . Micardis [Telmisartan] Other (See Comments)    Feels like she is going to pass out    History of Present Illness    75 year old female previously followed by Dr. Mare Ferrari for hypertension and palpitations. She recently was admitted for cryptogenic stroke  (right basal ganglia infarct), and underwent TEE revealing normal LV function without evidence of atrial thrombus. She subsequently underwent placement of an implantable loop recorder. She has had no events on this recorder up to this point. She has been recovering well from her stroke and can now walk, though with a limp. She is encouraged overall. Her blood pressure at home has been running in the 140s to 150s, and her husband has been checking it about 3 times per day. She has not been having any palpitations, chest pain, dyspnea, PND, orthopnea, dizziness, syncope, edema, or early satiety.  Home Medications    Prior to Admission medications   Medication Sig Start Date End Date Taking? Authorizing Provider  ALPRAZolam (XANAX) 0.25 MG tablet Take 1 tablet (0.25 mg total) by mouth at bedtime. 12/25/15  Yes Ivan Anchors Love, PA-C  amLODipine (NORVASC) 10 MG tablet Take 1 tablet (10 mg total) by mouth 2 (two) times daily. Reported on 12/27/2015 01/23/16  Yes Rogelia Mire, NP  aspirin EC 325 MG EC tablet Take 1 tablet (325 mg total) by mouth daily. 12/17/15  Yes Lavina Hamman, MD  B Complex-C-Folic Acid (STRESS FORMULA) TABS Take 1 tablet by mouth daily.    Yes Historical Provider, MD  carboxymethylcellulose (REFRESH PLUS) 0.5 % SOLN Place 1 drop into both eyes 3 (three) times daily as needed.   Yes Historical Provider, MD  clopidogrel (PLAVIX) 75 MG tablet Take 1  tablet (75 mg total) by mouth daily. 12/24/15  Yes Ivan Anchors Love, PA-C  estradiol (ESTRACE) 0.1 MG/GM vaginal cream Place 2 g vaginally as needed (dryness).    Yes Historical Provider, MD  irbesartan (AVAPRO) 300 MG tablet Take 1 tablet (300 mg total) by mouth daily. 12/29/15  Yes Velvet Bathe, MD  levothyroxine (SYNTHROID, LEVOTHROID) 25 MCG tablet Take 25 mcg by mouth daily before breakfast.  09/21/14  Yes Historical Provider, MD  LOTEMAX 0.5 % ophthalmic suspension Place 1 drop into both eyes 4 (four) times daily as needed. For dry itchy eyes  02/17/13  Yes Historical Provider, MD  metoprolol tartrate (LOPRESSOR) 25 MG tablet Take 0.5 tablets (12.5 mg total) by mouth 2 (two) times daily. 12/24/15  Yes Ivan Anchors Love, PA-C  Multiple Vitamin (MULTIVITAMIN WITH MINERALS) TABS tablet Take 1 tablet by mouth daily.   Yes Historical Provider, MD  polyethylene glycol (MIRALAX / GLYCOLAX) packet Take 17 g by mouth daily. Patient taking differently: Take 17 g by mouth daily as needed for moderate constipation.  12/17/15  Yes Lavina Hamman, MD  sertraline (ZOLOFT) 50 MG tablet Take 50 mg by mouth daily.  01/02/16  Yes Historical Provider, MD    Review of Systems    Strength is been improving since her stroke. She continues to walk with a limp but overall is pleased with her progression. She denies chest pain, palpitations, PND, orthopnea, dizziness, syncope, edema, or early satiety.  All other systems reviewed and are otherwise negative except as noted above.  Physical Exam    VS:  BP 128/84 mmHg  Pulse 76  Ht 5\' 2"  (1.575 m)  Wt 111 lb 6.4 oz (50.531 kg)  BMI 20.37 kg/m2 , BMI Body mass index is 20.37 kg/(m^2). GEN: Well nourished, well developed, in no acute distress. HEENT: normal. Neck: Supple, no JVD, carotid bruits, or masses. Cardiac: RRR, no murmurs, rubs, or gallops. No clubbing, cyanosis, edema.  Radials/DP/PT 2+ and equal bilaterally.  Respiratory:  Respirations regular and unlabored, clear to auscultation bilaterally. GI: Soft, nontender, nondistended, BS + x 4. MS: no deformity or atrophy. Skin: warm and dry, no rash. Neuro:  Strength and sensation are intact. Psych: Normal affect.  Accessory Clinical Findings    None  Assessment & Plan    1.  Cryptogenic stroke: Status post implantable loop recorder. This is followed by our electrophysiology team. She has been recovering strength and is now walking.  2. Essential hypertension: Blood pressure is okay today but has been running in the 140s to 150s at home. I will increase  her amlodipine to 10 mg daily. She had previously been taken 2.5 mg twice a day. Her husband would like for her to come off of the beta blocker as he is concerned that she may be experiencing some fatigue and I advised that that would be okay. He will continue to follow her blood pressure. She is currently only taking 12.5 twice a day. She had not been having any palpitations prior to being placed on beta blocker during recent hospitalization.  3. History of palpitations: These have been quiesced and for some time.  4. Disposition: Patient will follow up with Dr. Acie Fredrickson in three months.   Murray Hodgkins, NP 01/23/2016, 6:41 PM

## 2016-01-24 DIAGNOSIS — I350 Nonrheumatic aortic (valve) stenosis: Secondary | ICD-10-CM | POA: Diagnosis not present

## 2016-01-24 DIAGNOSIS — I69354 Hemiplegia and hemiparesis following cerebral infarction affecting left non-dominant side: Secondary | ICD-10-CM | POA: Diagnosis not present

## 2016-01-24 DIAGNOSIS — Z7901 Long term (current) use of anticoagulants: Secondary | ICD-10-CM | POA: Diagnosis not present

## 2016-01-24 DIAGNOSIS — E039 Hypothyroidism, unspecified: Secondary | ICD-10-CM | POA: Diagnosis not present

## 2016-01-24 DIAGNOSIS — I10 Essential (primary) hypertension: Secondary | ICD-10-CM | POA: Diagnosis not present

## 2016-01-24 DIAGNOSIS — Z7982 Long term (current) use of aspirin: Secondary | ICD-10-CM | POA: Diagnosis not present

## 2016-01-24 DIAGNOSIS — I69328 Other speech and language deficits following cerebral infarction: Secondary | ICD-10-CM | POA: Diagnosis not present

## 2016-01-28 ENCOUNTER — Encounter: Payer: Self-pay | Admitting: Physical Therapy

## 2016-01-28 ENCOUNTER — Ambulatory Visit: Payer: PPO | Attending: Internal Medicine | Admitting: Physical Therapy

## 2016-01-28 DIAGNOSIS — M6281 Muscle weakness (generalized): Secondary | ICD-10-CM | POA: Diagnosis not present

## 2016-01-28 DIAGNOSIS — G8194 Hemiplegia, unspecified affecting left nondominant side: Secondary | ICD-10-CM | POA: Diagnosis not present

## 2016-01-28 DIAGNOSIS — R262 Difficulty in walking, not elsewhere classified: Secondary | ICD-10-CM | POA: Diagnosis not present

## 2016-01-28 NOTE — Therapy (Signed)
[facility] [place], [place], [address] Phone: [phone]   Fax:  [phone]  Physical Therapy Treatment  Patient Details  Name: Stacey Ramirez MRN: [id number] Date of Birth: [birth year] Referring Provider: Shelia Media  Encounter Date: 01/28/2016      PT End of Session - 01/28/16 0957    Visit Number 1   Date for PT Re-Evaluation 03/29/16   PT Start Time 0924   PT Stop Time 1020   PT Time Calculation (min) 56 min   Activity Tolerance Patient tolerated treatment well   Behavior During Therapy [facility] for tasks assessed/performed      Past Medical History  Diagnosis Date  . Essential hypertension   . Hypercholesterolemia   . Hypothyroidism   . Vitamin D deficiency   . Palpitations   . History of dizziness   . Mitral regurgitation     a. 12/2015 Echo: EF 60-65%, mild focal basal hypertrophy. No rwma, triv AI, mild MR, mildly dil LA w/o thrombus. No RA thrombus. No PFO.  Marland Kitchen Difficulty walking   . Cerebrovascular accident (stroke) ([place])     a. 12/2015 - cryptogenic.  S/P MDT Linq.    Past Surgical History  Procedure Laterality Date  . Cardiovascular stress test  2002    NORMAL  . Transthoracic echocardiogram  2008    SHOWED LEFT VENTRICULAR HYPERTROPHY AND MILD AORTIC STENOSIS  . Ep implantable device N/A 12/17/2015    Procedure: Loop Recorder Insertion;  Surgeon: [name], MD;  Location: [place] [facility];  Service: Cardiovascular;  Laterality: N/A;  . Tee without cardioversion N/A 12/17/2015    Procedure: TRANSESOPHAGEAL ECHOCARDIOGRAM (TEE);  Surgeon: Lelon Perla, MD;  Location: [facility] ENDOSCOPY;  Service: Cardiovascular;  Laterality: N/A;  Pt also needs a LOOP    There were no vitals filed for this visit.      Subjective Assessment - 01/28/16 0926    Subjective Patient reports a history of two CVA's, most recent being May 12th, reports that since the recent stroke she has had much more difficulty  walking and getting up and doing things.  She reports some reactions to statin drugs as well.   Limitations Standing;Walking;House hold activities   Patient Stated Goals walk and have no difficulty with ADL's   Currently in Pain? No/denies            [facility] PT Assessment - 01/28/16 0001    Assessment   Medical Diagnosis difficulty walking   Referring Provider [place]   Onset Date/Surgical Date 12/13/15   Prior Therapy at home   Precautions   Precautions Fall   Balance Screen   Has the patient fallen in the past 6 months No   Has the patient had a decrease in activity level because of a fear of falling?  No   Is the patient reluctant to leave their home because of a fear of falling?  No   Home Environment   Additional Comments has stiars, prior to May 12th, she was cooking, cleaning and shopping   Prior Function   Level of Independence Independent   Vocation Retired   Leisure no exercise   Posture/Postural Control   Posture Comments fwd head, rounded shoulders   ROM / Strength   AROM / PROM / Strength AROM;Strength   AROM   Overall AROM Comments shoulder ROM WFL's, , knees and hips WNL's, ankle DF to 5 degrees   Strength   Overall Strength Comments  UE strength 3+/5, , LE strength 3+/5   Ambulation/Gait   Gait Comments no device, slow and shuffling gait, unsteady with turns   Standardized Balance Assessment   Standardized Balance Assessment Timed Up and Go Test;Berg Balance Test   Berg Balance Test   Sit to Stand Able to stand  independently using hands   Standing Unsupported Able to stand safely 2 minutes   Sitting with Back Unsupported but Feet Supported on Floor or Stool Able to sit safely and securely 2 minutes   Stand to Sit Controls descent by using hands   Transfers Able to transfer safely, definite need of hands   Standing Unsupported with Eyes Closed Able to stand 10 seconds with supervision   Standing Ubsupported with Feet Together Able to place feet together  independently and stand for 1 minute with supervision   From Standing, Reach Forward with Outstretched Arm Can reach confidently >25 cm (10")   From Standing Position, Pick up Object from [place] to pick up shoe safely and easily   From Standing Position, Turn to Look Behind Over each Shoulder Turn sideways only but maintains balance   Turn 360 Degrees Able to turn 360 degrees safely but slowly   Standing Unsupported, Alternately Place Feet on Step/Stool Able to complete 4 steps without aid or supervision   Standing Unsupported, One Foot in Front Needs help to step but can hold 15 seconds   Standing on One Leg Tries to lift leg/unable to hold 3 seconds but remains standing independently   Total Score 39   Timed Up and Go Test   Normal TUG (seconds) 24                     [facility] Adult PT Treatment/Exercise - 01/28/16 0001    Exercises   Exercises Knee/Hip   Knee/Hip Exercises: Aerobic   Nustep level 4 x 5 minutes   Knee/Hip Exercises: Machines for Strengthening   Cybex Knee Extension 5# 2x10   Cybex Knee Flexion 20# 2x10                  PT Short Term Goals - 01/28/16 1119    PT SHORT TERM GOAL #1   Title independent with her home HEP from home health   Time 2   Period Weeks   Status New           PT Long Term Goals - 01/28/16 1120    PT LONG TERM GOAL #1   Title decrease TUG time to 20 seconds   Time 8   Period Weeks   Status New   PT LONG TERM GOAL #2   Title increase Berg balance score to 46/56   Time 8   Period Weeks   Status New   PT LONG TERM GOAL #3   Title increase LE strength to 4/5   Time 8   Period Weeks   Status New   PT LONG TERM GOAL #4   Title walk 700 feet without device, without rest   Time 8   Period Weeks   Status New               Plan - 01/28/16 1117    Clinical Impression Statement Patient with a stroke May12, 2017.  She reports that she was getting better but was put on a statin, she reports that she  has had issues with statins in the past and that she has had increased weakness and more difficulty  walking in the past month.  She had difficulty getting up from sitting, she has unsteady and shuffling gait pattern and is weak   Rehab Potential Good   PT Frequency 2x / week   PT Duration 8 weeks   PT Treatment/Interventions ADLs/Self Care Home Management;Gait training;Stair training;Functional mobility training;Therapeutic activities;Therapeutic exercise;Manual techniques;Patient/family education;Neuromuscular re-education;Balance training   PT Next Visit Plan slowly add strength, balance and gait activities   Consulted and Agree with Plan of Care Patient      Patient will benefit from skilled therapeutic intervention in order to improve the following deficits and impairments:  Abnormal gait, Decreased activity tolerance, Decreased balance, Decreased mobility, Decreased range of motion, Difficulty walking, Decreased strength  Visit Diagnosis: Muscle weakness (generalized) - Plan: PT plan of care cert/re-cert  Difficulty in walking, not elsewhere classified - Plan: PT plan of care cert/re-cert       G-Codes - 02/27/2016 1121    Functional Assessment Tool Used 60% limitation   Functional Limitation Mobility: Walking and moving around   Mobility: Walking and Moving Around Current Status [phone]) At least 60 percent but less than 80 percent impaired, limited or restricted   Mobility: Walking and Moving Around Goal Status [phone]) At least 40 percent but less than 60 percent impaired, limited or restricted      Problem List Patient Active Problem List   Diagnosis Date Noted  . Essential hypertension   . Cerebrovascular accident (stroke) ([place])   . Left-sided weakness 12/28/2015  . Accelerated hypertension 12/28/2015  . Headache 12/28/2015  . Adjustment reaction with anxiety 12/20/2015  . Stroke due to embolism of right middle cerebral artery ([place]) 12/17/2015  . Left hemiparesis ([place])    . Cerebrovascular accident (CVA) due to occlusion of cerebral artery ([place])   . History of CVA with residual deficit   . Prediabetes   . Right Basal ganglia infarction ([place]) 12/14/2015  . Acute CVA (cerebrovascular accident) ([place]) 12/14/2015  . SVT (supraventricular tachycardia) ([place]) 12/14/2015  . Stroke ([place]) 12/13/2015  . Stroke-like symptom 12/13/2015  . History of stroke   . Heart palpitations 09/06/2012  . Hypertension   . Hypercholesterolemia   . Hypothyroidism   . Vitamin D deficiency   . Palpitations   . History of dizziness   . LVH (left ventricular hypertrophy)   . Aortic stenosis   . Mitral regurgitation   . SOB (shortness of breath)   . Difficulty walking     Sumner Boast., PT February 27, 2016, 11:25 AM  [address] [place], [place], [address] Phone: [phone]   Fax:  [phone]  Name: Stacey Ramirez MRN: [id number] Date of Birth: 1941-01-28

## 2016-01-29 ENCOUNTER — Encounter: Payer: Self-pay | Admitting: Internal Medicine

## 2016-01-29 ENCOUNTER — Telehealth: Payer: Self-pay | Admitting: *Deleted

## 2016-01-29 NOTE — Telephone Encounter (Signed)
Spoke with patient's husband.  Advised that manual transmission was received and that the episodes do not appear to be AF.  Advised that Dr. Rayann Heman will still review strips.  Advised that if Dr. Rayann Heman has any further recommendations that I will call them.  Patient's husband verbalizes understanding.  He also wanted to know if they need to come to the office on 7/17.  Advised that this appointment is an automatic transmission from home (recurrs monthly) and that they do not come in to the office for these appointments.  Patient's husband appreciative and denies questions or concerns at this time.

## 2016-01-29 NOTE — Telephone Encounter (Signed)
[facility] requesting call back.  Gave device clinic phone number for return call.  Will request that patient send a manual transmission from her home monitor when she returns call.

## 2016-01-29 NOTE — Telephone Encounter (Signed)
Patient's husband returned call.  Pamala Hurry, CMA requested a manual transmission and gave instructions.  Patient's husband to send.

## 2016-01-29 NOTE — Telephone Encounter (Signed)
Transmission successfully received.  Episodes printed and placed in [facility] folder for review by Dr. Rayann Heman.  ECGs suggest sinus rhythm with PACs and PVCs, not AF.

## 2016-01-30 ENCOUNTER — Encounter: Payer: Self-pay | Admitting: *Deleted

## 2016-01-30 ENCOUNTER — Other Ambulatory Visit: Payer: Self-pay | Admitting: *Deleted

## 2016-01-30 ENCOUNTER — Ambulatory Visit: Payer: PPO | Admitting: Physical Therapy

## 2016-01-30 ENCOUNTER — Encounter: Payer: Self-pay | Admitting: Physical Therapy

## 2016-01-30 ENCOUNTER — Ambulatory Visit: Payer: PPO | Admitting: Cardiovascular Disease

## 2016-01-30 DIAGNOSIS — R262 Difficulty in walking, not elsewhere classified: Secondary | ICD-10-CM

## 2016-01-30 DIAGNOSIS — M6281 Muscle weakness (generalized): Secondary | ICD-10-CM

## 2016-01-30 DIAGNOSIS — G8194 Hemiplegia, unspecified affecting left nondominant side: Secondary | ICD-10-CM

## 2016-01-30 NOTE — Patient Outreach (Signed)
[facility]) Care Management  01/30/2016  T[place] Kading Venezia 1941-06-14 [id number]  Referral received for ongoing transition of care contacts. RN spoke with both the pt and her spouse and reintroduced the [facility] program and services. Confirmed identifiers and proceed to completed the initial assessment and further discussed pt's issues with her recent hospitalization and history of strokes. Education pt on HTN and possible symptoms that maybe encountered and what to do if acute. Further extended the offer for home visits for a face-to-face educational session related her HTN. Pt receptive and a home visit was arranged. Also inquired on ongoing transition of care contacts over the next few weeks with Roderick Pee case manager covering for the call next week. Pt and spouse both receptive and appreciate the call today and information provided.   Raina Mina, RN Care Management Coordinator [place] Office [phone]

## 2016-01-30 NOTE — Therapy (Signed)
[place], [place], [address] Phone: [phone]   Fax:  [phone]  Physical Therapy Treatment  Patient Details  Name: Stacey Ramirez MRN: [id number] Date of Birth: 02-Jul-1941 Referring Provider: Shelia Media  Encounter Date: 01/30/2016      PT End of Session - 01/30/16 1434    Visit Number 2   Date for PT Re-Evaluation 03/29/16   PT Start Time [id number]   PT Stop Time 1440   PT Time Calculation (min) 48 min      Past Medical History  Diagnosis Date  . Essential hypertension   . Hypercholesterolemia   . Hypothyroidism   . Vitamin D deficiency   . Palpitations   . History of dizziness   . Mitral regurgitation     a. 12/2015 Echo: EF 60-65%, mild focal basal hypertrophy. No rwma, triv AI, mild MR, mildly dil LA w/o thrombus. No RA thrombus. No PFO.  Marland Kitchen Difficulty walking   . Cerebrovascular accident (stroke) ([place])     a. 12/2015 - cryptogenic.  S/P MDT Linq.    Past Surgical History  Procedure Laterality Date  . Cardiovascular stress test  2002    NORMAL  . Transthoracic echocardiogram  2008    SHOWED LEFT VENTRICULAR HYPERTROPHY AND MILD AORTIC STENOSIS  . Ep implantable device N/A 12/17/2015    Procedure: Loop Recorder Insertion;  Surgeon: [name], MD;  Location: [place] [facility];  Service: Cardiovascular;  Laterality: N/A;  . Tee without cardioversion N/A 12/17/2015    Procedure: TRANSESOPHAGEAL ECHOCARDIOGRAM (TEE);  Surgeon: Lelon Perla, MD;  Location[facility] ENDOSCOPY;  Service: Cardiovascular;  Laterality: N/A;  Pt also needs a LOOP    There were no vitals filed for this visit.      Subjective Assessment - 01/30/16 1404    Subjective " almost passed out earlier, still woozy"   Currently in Pain? No/denies                         [facility] Adult PT Treatment/Exercise - 01/30/16 0001    Ambulation/Gait   Gait Comments HHA gait to work on increased stride and step  thru gait 100 feet 2 times   Knee/Hip Exercises: Aerobic   Nustep L 4 6 min   Knee/Hip Exercises: Machines for Strengthening   Cybex Knee Extension 5# 2x10   Cybex Knee Flexion 20# 2x10   Cybex Leg Press 20# 2 sets 10   Knee/Hip Exercises: Standing   Other Standing Knee Exercises 3# HHA marching fwd and back 20 feet 2 times each, side stepping 20 feet 2 times each   Other Standing Knee Exercises ball toss on airex, func reaching on airex,ball kicking                  PT Short Term Goals - 01/28/16 1119    PT SHORT TERM GOAL #1   Title independent with her home HEP from home health   Time 2   Period Weeks   Status New           PT Long Term Goals - 01/28/16 1120    PT LONG TERM GOAL #1   Title decrease TUG time to 20 seconds   Time 8   Period Weeks   Status New   PT LONG TERM GOAL #2   Title increase Berg balance score to 46/56   Time 8   Period Weeks  Status New   PT LONG TERM GOAL #3   Title increase LE strength to 4/5   Time 8   Period Weeks   Status New   PT LONG TERM GOAL #4   Title walk 700 feet without device, without rest   Time 8   Period Weeks   Status New               Plan - 01/30/16 1442    Clinical Impression Statement ataxic gait but with HHA and cuing significant improvements but por carry over. tolerated ther ex well. decreased balacne on dynamic surface with posterior LOB multi times. BP monitored thru session 130 to 140 over 90.   PT Next Visit Plan slowly add strength, balance and gait activities      Patient will benefit from skilled therapeutic intervention in order to improve the following deficits and impairments:  Abnormal gait, Decreased activity tolerance, Decreased balance, Decreased mobility, Decreased range of motion, Difficulty walking, Decreased strength  Visit Diagnosis: Muscle weakness (generalized)  Difficulty in walking, not elsewhere classified  Left hemiparesis [facility])     Problem List Patient  Active Problem List   Diagnosis Date Noted  . Essential hypertension   . Cerebrovascular accident (stroke) ([place])   . Left-sided weakness 12/28/2015  . Accelerated hypertension 12/28/2015  . Headache 12/28/2015  . Adjustment reaction with anxiety 12/20/2015  . Stroke due to embolism of right middle cerebral artery ([place]) 12/17/2015  . Left hemiparesis ([place])   . Cerebrovascular accident (CVA) due to occlusion of cerebral artery ([place])   . History of CVA with residual deficit   . Prediabetes   . Right Basal ganglia infarction ([place]) 12/14/2015  . Acute CVA (cerebrovascular accident) ([place]) 12/14/2015  . SVT (supraventricular tachycardia) ([place]) 12/14/2015  . Stroke ([place]) 12/13/2015  . Stroke-like symptom 12/13/2015  . History of stroke   . Heart palpitations 09/06/2012  . Hypertension   . Hypercholesterolemia   . Hypothyroidism   . Vitamin D deficiency   . Palpitations   . History of dizziness   . LVH (left ventricular hypertrophy)   . Aortic stenosis   . Mitral regurgitation   . SOB (shortness of breath)   . Difficulty walking     Raeford Brandenburg,ANGIE PTA 01/30/2016, 2:44 PM  [place], [place], [address] Phone: [phone]   Fax:  [phone]  Name: Stacey Ramirez MRN: [id number] Date of Birth: [birth year]

## 2016-02-06 ENCOUNTER — Ambulatory Visit: Payer: PPO | Attending: Internal Medicine | Admitting: Physical Therapy

## 2016-02-06 ENCOUNTER — Encounter: Payer: Self-pay | Admitting: Physical Therapy

## 2016-02-06 DIAGNOSIS — R262 Difficulty in walking, not elsewhere classified: Secondary | ICD-10-CM | POA: Insufficient documentation

## 2016-02-06 DIAGNOSIS — M6281 Muscle weakness (generalized): Secondary | ICD-10-CM | POA: Diagnosis not present

## 2016-02-06 DIAGNOSIS — G8194 Hemiplegia, unspecified affecting left nondominant side: Secondary | ICD-10-CM | POA: Diagnosis not present

## 2016-02-06 NOTE — Therapy (Signed)
Stacey Ramirez [place], [place], [address] Phone: [phone]   Fax:  [phone]  Physical Therapy Treatment  Patient Details  Name: Stacey Ramirez MRN: [id number] Date of Birth: [birth year] Referring Provider: Shelia Media  Encounter Date: 02/06/2016      PT End of Session - 02/06/16 1650    Visit Number 3   Date for PT Re-Evaluation 03/29/16   PT Start Time [id number]   PT Stop Time 1655   PT Time Calculation (min) 45 min      Past Medical History  Diagnosis Date  . Essential hypertension   . Hypercholesterolemia   . Hypothyroidism   . Vitamin D deficiency   . Palpitations   . History of dizziness   . Mitral regurgitation     a. 12/2015 Echo: EF 60-65%, mild focal basal hypertrophy. No rwma, triv AI, mild MR, mildly dil LA w/o thrombus. No RA thrombus. No PFO.  Marland Kitchen Difficulty walking   . Cerebrovascular accident (stroke) ([place])     a. 12/2015 - cryptogenic.  S/P MDT Linq.    Past Surgical History  Procedure Laterality Date  . Cardiovascular stress test  2002    NORMAL  . Transthoracic echocardiogram  2008    SHOWED LEFT VENTRICULAR HYPERTROPHY AND MILD AORTIC STENOSIS  . Ep implantable device N/A 12/17/2015    Procedure: Loop Recorder Insertion;  Surgeon: [name], MD;  Location: [facility];  Service: Cardiovascular;  Laterality: N/A;  . Tee without cardioversion N/A 12/17/2015    Procedure: TRANSESOPHAGEAL ECHOCARDIOGRAM (TEE);  Surgeon: Lelon Perla, MD;  Location: [facility] ENDOSCOPY;  Service: Cardiovascular;  Laterality: N/A;  Pt also needs a LOOP    There were no vitals filed for this visit.      Subjective Assessment - 02/06/16 1609    Subjective just so tired all the time   Currently in Pain? No/denies                         [facility] Adult PT Treatment/Exercise - 02/06/16 0001    High Level Balance   High Level Balance Activities Negotiating over obstacles  2#   High Level  Balance Comments direction changes with ball toss-min A   Knee/Hip Exercises: Aerobic   Nustep L 5 7 min   Knee/Hip Exercises: Machines for Strengthening   Cybex Knee Extension 5# 2x10   Cybex Knee Flexion 20# 2x10   Cybex Leg Press 20# 2 sets 10   Knee/Hip Exercises: Standing   Forward Step Up 2 sets;10 reps;Hand Hold: 2;Step Height: 6"   Walking with Sports Cord 20# 3 times each way   Other Standing Knee Exercises sit to stand on airex 5 times  poor eccentric    Other Standing Knee Exercises ball toss with mvmt                  PT Short Term Goals - 02/06/16 1652    PT SHORT TERM GOAL #1   Title independent with her home HEP from home health   Status Achieved           PT Long Term Goals - 01/28/16 1120    PT LONG TERM GOAL #1   Title decrease TUG time to 20 seconds   Time 8   Period Weeks   Status New   PT LONG TERM GOAL #2   Title increase Berg balance score to 46/56  Time 8   Period Weeks   Status New   PT LONG TERM GOAL #3   Title increase LE strength to 4/5   Time 8   Period Weeks   Status New   PT LONG TERM GOAL #4   Title walk 700 feet without device, without rest   Time 8   Period Weeks   Status New               Plan - 02/06/16 1651    Clinical Impression Statement improved gait with step thru stride and increased cadeance. decreased directional gait and changes, poor advancement of left LE.   PT Next Visit Plan strength, balance and gait activities      Patient will benefit from skilled therapeutic intervention in order to improve the following deficits and impairments:  Abnormal gait, Decreased activity tolerance, Decreased balance, Decreased mobility, Decreased range of motion, Difficulty walking, Decreased strength  Visit Diagnosis: Muscle weakness (generalized)  Difficulty in walking, not elsewhere classified  Left hemiparesis [facility])     Problem List Patient Active Problem List   Diagnosis Date Noted  . Essential  hypertension   . Cerebrovascular accident (stroke) ([place])   . Left-sided weakness 12/28/2015  . Accelerated hypertension 12/28/2015  . Headache 12/28/2015  . Adjustment reaction with anxiety 12/20/2015  . Stroke due to embolism of right middle cerebral artery ([place]) 12/17/2015  . Left hemiparesis ([place])   . Cerebrovascular accident (CVA) due to occlusion of cerebral artery ([place])   . History of CVA with residual deficit   . Prediabetes   . Right Basal ganglia infarction ([place]) 12/14/2015  . Acute CVA (cerebrovascular accident) ([place]) 12/14/2015  . SVT (supraventricular tachycardia) ([place]) 12/14/2015  . Stroke ([place]) 12/13/2015  . Stroke-like symptom 12/13/2015  . History of stroke   . Heart palpitations 09/06/2012  . Hypertension   . Hypercholesterolemia   . Hypothyroidism   . Vitamin D deficiency   . Palpitations   . History of dizziness   . LVH (left ventricular hypertrophy)   . Aortic stenosis   . Mitral regurgitation   . SOB (shortness of breath)   . Difficulty walking     [name],ANGIE PTA 02/06/2016, 4:53 PM  [place], [place], [address] Phone: [phone]   Fax:  [phone]  Name: Stacey Ramirez MRN: [id number] Date of Birth: Dec 10, 1940

## 2016-02-07 ENCOUNTER — Other Ambulatory Visit: Payer: Self-pay | Admitting: *Deleted

## 2016-02-07 NOTE — Patient Outreach (Signed)
Covering for Stacey Ramirez, weekly transition of care call placed to member.  She state she is "coming along."  She report that she is attending outpatient rehab at least twice a week and feels that it is "helping a lot."  She state that her husband is her primary caregiver and that he "takes really good care of me."  She report that he is monitoring her blood pressure and that it has started to come down to her target level.  She is unable to provide specific numbers as they are in the car driving.  She denies any concerns/questions at this time, reports taking all medications as prescribed.    Will provide assigned care manger with update upon her return, member will receive follow up call next week.  Valente David, [place], MSN [place] [phone]

## 2016-02-10 ENCOUNTER — Other Ambulatory Visit: Payer: Self-pay | Admitting: *Deleted

## 2016-02-10 ENCOUNTER — Ambulatory Visit: Payer: PPO | Admitting: Physical Therapy

## 2016-02-10 NOTE — Patient Outreach (Signed)
[facility]) Care Management  02/10/2016  Sristi Ollila Petron 1940/09/27 [id number]  RN spoke with pt today for ongoing transition of care. Pt reports a recent trip to see her grandchildren which was "great". Pt states she is doing a lot better and felt the trip was much needed. States her BP has improved with her ongoing medication and states she is taking her medications as prescribed, attending all medical appointments. Pt continues to adhere to the plan of care as RN reiterated on today. Reminded pt of the initial home visit scheduled for this week. Will follow up accordingly and completed. No other inquires or request at this time.   Raina Mina, RN Care Management Coordinator [place] Office [phone]

## 2016-02-11 ENCOUNTER — Ambulatory Visit: Payer: PPO | Admitting: Physical Therapy

## 2016-02-11 ENCOUNTER — Encounter: Payer: Self-pay | Admitting: Physical Therapy

## 2016-02-11 DIAGNOSIS — R262 Difficulty in walking, not elsewhere classified: Secondary | ICD-10-CM

## 2016-02-11 DIAGNOSIS — M6281 Muscle weakness (generalized): Secondary | ICD-10-CM

## 2016-02-11 DIAGNOSIS — G8194 Hemiplegia, unspecified affecting left nondominant side: Secondary | ICD-10-CM

## 2016-02-11 NOTE — Therapy (Signed)
[place], [place], [address] Phone: [phone]   Fax:  [phone]  Physical Therapy Treatment  Patient Details  Name: Stacey Ramirez MRN: [id number] Date of Birth: [birth year] Referring Provider: Shelia Media  Encounter Date: 02/11/2016      PT End of Session - 02/11/16 1450    Visit Number 4   Date for PT Re-Evaluation 03/29/16   PT Start Time 1400   PT Stop Time 1450   PT Time Calculation (min) 50 min      Past Medical History  Diagnosis Date  . Essential hypertension   . Hypercholesterolemia   . Hypothyroidism   . Vitamin D deficiency   . Palpitations   . History of dizziness   . Mitral regurgitation     a. 12/2015 Echo: EF 60-65%, mild focal basal hypertrophy. No rwma, triv AI, mild MR, mildly dil LA w/o thrombus. No RA thrombus. No PFO.  Marland Kitchen Difficulty walking   . Cerebrovascular accident (stroke) ([place])     a. 12/2015 - cryptogenic.  S/P MDT Linq.    Past Surgical History  Procedure Laterality Date  . Cardiovascular stress test  2002    NORMAL  . Transthoracic echocardiogram  2008    SHOWED LEFT VENTRICULAR HYPERTROPHY AND MILD AORTIC STENOSIS  . Ep implantable device N/A 12/17/2015    Procedure: Loop Recorder Insertion;  Surgeon: [name], MD;  Location: [facility];  Service: Cardiovascular;  Laterality: N/A;  . Tee without cardioversion N/A 12/17/2015    Procedure: TRANSESOPHAGEAL ECHOCARDIOGRAM (TEE);  Surgeon: Lelon Perla, MD;  Location: [facility] ENDOSCOPY;  Service: Cardiovascular;  Laterality: N/A;  Pt also needs a LOOP    There were no vitals filed for this visit.      Subjective Assessment - 02/11/16 1359    Subjective went to beach this weekend, did okay   Currently in Pain? No/denies                         [facility] Adult PT Treatment/Exercise - 02/11/16 0001    High Level Balance   High Level Balance Activities Negotiating over obstacles  2#   Knee/Hip Exercises: Aerobic   Nustep L 5 7 min   Knee/Hip Exercises: Machines for Strengthening   Cybex Knee Extension 5# 2x10   Cybex Knee Flexion 20# 2x10   Cybex Leg Press 20# 2 sets 10   Knee/Hip Exercises: Standing   Forward Step Up 2 sets;10 reps;Hand Hold: 2;Step Height: 6"   Walking with Sports Cord 20# 3 times each way   Other Standing Knee Exercises marching with weight ball  2# UE circuit   Other Standing Knee Exercises wt ball tosses   Knee/Hip Exercises: Seated   Sit to Sand 10 reps;with UE support  on airex                  PT Short Term Goals - 02/06/16 1652    PT SHORT TERM GOAL #1   Title independent with her home HEP from home health   Status Achieved           PT Long Term Goals - 01/28/16 1120    PT LONG TERM GOAL #1   Title decrease TUG time to 20 seconds   Time 8   Period Weeks   Status New   PT LONG TERM GOAL #2   Title increase Berg balance score to 46/56  Time 8   Period Weeks   Status New   PT LONG TERM GOAL #3   Title increase LE strength to 4/5   Time 8   Period Weeks   Status New   PT LONG TERM GOAL #4   Title walk 700 feet without device, without rest   Time 8   Period Weeks   Status New               Plan - 02/11/16 1450    Clinical Impression Statement posterior lean with activity and LOB with high level balance. decreased use of Left UE with wt ball activity.   PT Next Visit Plan strength, balance and gait activities      Patient will benefit from skilled therapeutic intervention in order to improve the following deficits and impairments:  Abnormal gait, Decreased activity tolerance, Decreased balance, Decreased mobility, Decreased range of motion, Difficulty walking, Decreased strength  Visit Diagnosis: Muscle weakness (generalized)  Difficulty in walking, not elsewhere classified  Left hemiparesis [facility])     Problem List Patient Active Problem List   Diagnosis Date Noted  . Essential  hypertension   . Cerebrovascular accident (stroke) ([place])   . Left-sided weakness 12/28/2015  . Accelerated hypertension 12/28/2015  . Headache 12/28/2015  . Adjustment reaction with anxiety 12/20/2015  . Stroke due to embolism of right middle cerebral artery ([place]) 12/17/2015  . Left hemiparesis ([place])   . Cerebrovascular accident (CVA) due to occlusion of cerebral artery ([place])   . History of CVA with residual deficit   . Prediabetes   . Right Basal ganglia infarction ([place]) 12/14/2015  . Acute CVA (cerebrovascular accident) ([place]) 12/14/2015  . SVT (supraventricular tachycardia) ([place]) 12/14/2015  . Stroke ([place]) 12/13/2015  . Stroke-like symptom 12/13/2015  . History of stroke   . Heart palpitations 09/06/2012  . Hypertension   . Hypercholesterolemia   . Hypothyroidism   . Vitamin D deficiency   . Palpitations   . History of dizziness   . LVH (left ventricular hypertrophy)   . Aortic stenosis   . Mitral regurgitation   . SOB (shortness of breath)   . Difficulty walking     [place] Emmitt,ANGIE PTA 02/11/2016, 2:53 PM  [place], [place], [address] Phone: [phone]   Fax:  [phone]  Name: Stacey Ramirez MRN: [id number] Date of Birth: 1941/03/17

## 2016-02-13 ENCOUNTER — Other Ambulatory Visit: Payer: Self-pay | Admitting: *Deleted

## 2016-02-13 ENCOUNTER — Encounter: Payer: Self-pay | Admitting: Physical Therapy

## 2016-02-13 ENCOUNTER — Ambulatory Visit: Payer: PPO | Admitting: Physical Therapy

## 2016-02-13 DIAGNOSIS — M6281 Muscle weakness (generalized): Secondary | ICD-10-CM | POA: Diagnosis not present

## 2016-02-13 DIAGNOSIS — G8194 Hemiplegia, unspecified affecting left nondominant side: Secondary | ICD-10-CM

## 2016-02-13 DIAGNOSIS — R262 Difficulty in walking, not elsewhere classified: Secondary | ICD-10-CM

## 2016-02-13 NOTE — Therapy (Signed)
[place], [place], [address] Phone: [phone]   Fax:  [phone]  Physical Therapy Treatment  Patient Details  Name: Stacey Ramirez MRN: [id number] Date of Birth: 12/24/1940 Referring Provider: Shelia Media  Encounter Date: 02/13/2016      PT End of Session - 02/13/16 1445    Visit Number 5   Date for PT Re-Evaluation 03/29/16   PT Start Time 1400   PT Stop Time 1450   PT Time Calculation (min) 50 min   Activity Tolerance Patient tolerated treatment well      Past Medical History  Diagnosis Date  . Essential hypertension   . Hypercholesterolemia   . Hypothyroidism   . Vitamin D deficiency   . Palpitations   . History of dizziness   . Mitral regurgitation     a. 12/2015 Echo: EF 60-65%, mild focal basal hypertrophy. No rwma, triv AI, mild MR, mildly dil LA w/o thrombus. No RA thrombus. No PFO.  Marland Kitchen Difficulty walking   . Cerebrovascular accident (stroke) ([place])     a. 12/2015 - cryptogenic.  S/P MDT Linq.    Past Surgical History  Procedure Laterality Date  . Cardiovascular stress test  2002    NORMAL  . Transthoracic echocardiogram  2008    SHOWED LEFT VENTRICULAR HYPERTROPHY AND MILD AORTIC STENOSIS  . Ep implantable device N/A 12/17/2015    Procedure: Loop Recorder Insertion;  Surgeon: [name], MD;  Location: [facility];  Service: Cardiovascular;  Laterality: N/A;  . Tee without cardioversion N/A 12/17/2015    Procedure: TRANSESOPHAGEAL ECHOCARDIOGRAM (TEE);  Surgeon: Lelon Perla, MD;  Location: [facility] ENDOSCOPY;  Service: Cardiovascular;  Laterality: N/A;  Pt also needs a LOOP    There were no vitals filed for this visit.      Subjective Assessment - 02/13/16 1408    Subjective Pt reported that she did not eat lunch today and is not feeling as strong. No pain today.    Currently in Pain? No/denies                         [facility] Adult PT  Treatment/Exercise - 02/13/16 0001    Ambulation/Gait   Gait Comments ambulated HHA 224ft working on increase speed and stride   High Level Balance   High Level Balance Activities Negotitating around obstacles;Braiding;Direction changes;Negotiating over obstacles  HHA through obstacle course   Knee/Hip Exercises: Aerobic   Nustep L 5 7 min   Knee/Hip Exercises: Machines for Strengthening   Cybex Knee Extension 5# 2x10   Cybex Knee Flexion 20# 2x10   Cybex Leg Press 20# 2 sets 10   Knee/Hip Exercises: Standing   Lateral Step Up 1 set;10 reps;Hand Hold: 2;Step Height: 6"   Forward Step Up 1 set;10 reps;Hand Hold: 2;Step Height: 6"                  PT Short Term Goals - 02/06/16 1652    PT SHORT TERM GOAL #1   Title independent with her home HEP from home health   Status Achieved           PT Long Term Goals - 02/13/16 1541    PT LONG TERM GOAL #1   Title decrease TUG time to 20 seconds   Status Achieved   PT LONG TERM GOAL #2   Title increase Berg balance score to 46/56   Status On-going  PT LONG TERM GOAL #3   Title increase LE strength to 4/5   Status On-going   PT LONG TERM GOAL #4   Title walk 700 feet without device, without rest   Status On-going               Plan - 02/13/16 1451    Clinical Impression Statement Pt tolerated treatment well but was more fatigued than usual. Reached goal of decreasing TUG time to 20 secs. Pt needed HHA for obstacle course, started with decreased foot clearance over obstacles, but was able to increase foot clearance towards end. Pt had decreased step length throughout obstacle course and had a LOB twice but corrected with assistance. Pt continues to have significant posterior lean with all activities.   PT Next Visit Plan strength, balance and gait activities, incorporate aspects of BERG      Patient will benefit from skilled therapeutic intervention in order to improve the following deficits and impairments:   Abnormal gait, Decreased activity tolerance, Decreased balance, Decreased mobility, Decreased range of motion, Difficulty walking, Decreased strength  Visit Diagnosis: Muscle weakness (generalized)  Difficulty in walking, not elsewhere classified  Left hemiparesis [facility])     Problem List Patient Active Problem List   Diagnosis Date Noted  . Essential hypertension   . Cerebrovascular accident (stroke) ([place])   . Left-sided weakness 12/28/2015  . Accelerated hypertension 12/28/2015  . Headache 12/28/2015  . Adjustment reaction with anxiety 12/20/2015  . Stroke due to embolism of right middle cerebral artery ([place]) 12/17/2015  . Left hemiparesis ([place])   . Cerebrovascular accident (CVA) due to occlusion of cerebral artery ([place])   . History of CVA with residual deficit   . Prediabetes   . Right Basal ganglia infarction ([place]) 12/14/2015  . Acute CVA (cerebrovascular accident) ([place]) 12/14/2015  . SVT (supraventricular tachycardia) ([place]) 12/14/2015  . Stroke ([place]) 12/13/2015  . Stroke-like symptom 12/13/2015  . History of stroke   . Heart palpitations 09/06/2012  . Hypertension   . Hypercholesterolemia   . Hypothyroidism   . Vitamin D deficiency   . Palpitations   . History of dizziness   . LVH (left ventricular hypertrophy)   . Aortic stenosis   . Mitral regurgitation   . SOB (shortness of breath)   . Difficulty walking     Dallie Piles 02/13/2016, [place] [address] [place], [place], [address] Phone: [phone]   Fax:  [phone]  Name: Stacey Ramirez MRN: [id number] Date of Birth: August 30, 1940

## 2016-02-13 NOTE — Patient Outreach (Signed)
[facility]) Care Management   02/13/2016  Stacey Ramirez [birth year] [id number]  Stacey Ramirez is an 75 y.o. female  Subjective:  HTN: Pt reports her history of HTN and recent issues related however pt has a family history of cardiac disease and aware of most medical problems related to the heart. Pt reports she had TIAs but did not experience have any other symptoms prior to her stroke and left side weakness.  Pt receptive to the educational information provided today via EMMI and RN case manager and agreed to ongoing follow up appointments.  MEDICAL APPOINTMENTS: Pt reports attending all medical appointments with no delays and has sufficient transportation by her spouse who is her primary caregiver.  MEDICATIONS: Pt confirms all medications after review of her medications today with no needed refills.  SAFETY: Verified no falls or injuries as pt continues to increase her ambulation by walking in her cuddle-sac outside her home. Pt also walks around in her home several times a day building her strength. Pt reports completed in home PT and now going to out patient PT services.   Objective:   Review of Systems  Constitutional: Negative.   HENT: Negative.   Eyes: Negative.   Respiratory: Negative.   Cardiovascular: Positive for leg swelling.       Right leg swelling (history) 1+ edema  Gastrointestinal: Negative.   Genitourinary: Negative.   Musculoskeletal: Negative.   Skin: Negative.   Neurological: Negative.   Endo/Heme/Allergies: Negative.   Psychiatric/Behavioral: Negative.     Physical Exam  Constitutional: She is oriented to person, place, and time. She appears well-developed and well-nourished.  HENT:  Right Ear: External ear normal.  Left Ear: External ear normal.  Eyes: EOM are normal.  Neck: Normal range of motion.  Cardiovascular: Normal heart sounds.   Respiratory: Effort normal and breath sounds normal.  GI: Soft. Bowel sounds are normal.   Musculoskeletal: Normal range of motion.  Neurological: She is alert and oriented to person, place, and time.  Skin: Skin is warm and dry.  Psychiatric: She has a normal mood and affect. Her behavior is normal. Judgment and thought content normal.    Encounter Medications:   Outpatient Encounter Prescriptions as of 02/13/2016  Medication Sig Note  . ALPRAZolam (XANAX) 0.25 MG tablet Take 1 tablet (0.25 mg total) by mouth at bedtime. (Patient taking differently: Take 0.25 mg by mouth at bedtime. Pt takes as needed)   . amLODipine (NORVASC) 10 MG tablet Take 1 tablet (10 mg total) by mouth 2 (two) times daily. Reported on 12/27/2015 01/24/2016: Per spouse, patient is taking 1 tablet 10 mg once daily  . aspirin EC 325 MG EC tablet Take 1 tablet (325 mg total) by mouth daily.   . B Complex-C-Folic Acid (STRESS FORMULA) TABS Take 1 tablet by mouth daily. Reported on 01/24/2016   . carboxymethylcellulose (REFRESH PLUS) 0.5 % SOLN Place 1 drop into both eyes 3 (three) times daily as needed.   . clopidogrel (PLAVIX) 75 MG tablet Take 1 tablet (75 mg total) by mouth daily.   Marland Kitchen estradiol (ESTRACE) 0.1 MG/GM vaginal cream Place 2 g vaginally as needed (dryness).    . irbesartan (AVAPRO) 300 MG tablet Take 1 tablet (300 mg total) by mouth daily.   Marland Kitchen levothyroxine (SYNTHROID, LEVOTHROID) 25 MCG tablet Take 25 mcg by mouth daily before breakfast.    . LOTEMAX 0.5 % ophthalmic suspension Place 1 drop into both eyes 4 (four) times daily as needed. For dry  itchy eyes   . Multiple Vitamin (MULTIVITAMIN WITH MINERALS) TABS tablet Take 1 tablet by mouth daily.   . polyethylene glycol (MIRALAX / GLYCOLAX) packet Take 17 g by mouth daily. (Patient taking differently: Take 17 g by mouth daily as needed for moderate constipation. )   . sertraline (ZOLOFT) 50 MG tablet Take 50 mg by mouth daily.    . metoprolol tartrate (LOPRESSOR) 25 MG tablet Take 0.5 tablets (12.5 mg total) by mouth 2 (two) times daily. (Patient not  taking: Reported on 01/30/2016) 01/24/2016: Per spouse ; will stop taking tomorrow as instructed by MD.     No facility-administered encounter medications on file as of 02/13/2016.    Functional Status:   In your present state of health, do you have any difficulty performing the following activities: 01/30/2016 01/24/2016  Hearing? N N  Vision? N N  Difficulty concentrating or making decisions? Tempie Donning  Walking or climbing stairs? N Y  Dressing or bathing? N N  Doing errands, shopping? Tempie Donning  Preparing Food and eating ? N -  Using the Toilet? N -  In the past six months, have you accidently leaked urine? N -  Do you have problems with loss of bowel control? N -  Managing your Medications? N -  Managing your Finances? N -  Housekeeping or managing your Housekeeping? Y -  BP 148/78 mmHg  Pulse 76  Resp 20  Ht 1.575 m (5\' 2" )  Wt 114 lb (51.71 kg)  BMI 20.85 kg/m2  SpO2 96%   Fall/Depression Screening:    PHQ 2/9 Scores 01/30/2016 01/24/2016 01/03/2016  PHQ - 2 Score 0 0 5  PHQ- 9 Score - - 15    Assessment:   Introduction to the [facility] program and services Case management related to HTN Follow up on medical appointments Follow up on medication adherence Safety related to outpatient rehabilitation  Plan:  Will completed a physical assessment with no acute findings today. Will discussed the [facility] program and services and complete the consent form for enrollment.  Will introduced HTN and educate on HTN and the risk if not monitored. Will discussed plan of care and goal related to her HTN and present printable material for EMMI educational information (High Blood Pressure: what you can do, About High Pressure, High Blood Pressure: Health Problems). Will discuss hearty heart low sodium dietary measures and provide a chart for label readings on serving size and how to find the sodium levels in food items. Will continue to encourage pt ot take her BP with her home device regularly for ongoing  monitoring.  Will verify pt continues to attend all medical appointments with no delays and has sufficient transportation with no delays. Will provide other resources for transportation if needed such as [facility] if pt is interested in the future. Will encourage adherence with her attendance and continue to follow up accordingly.  Will verify pt is adminisitering all prescribed medications with no needed refills. Will discussed all medications and verify pt's understanding of all her medications and the importance of taking them as prescribed. Will continue to encouraged adherence.  Will verified pt is using her rollator when needed during her activities. Will strongly encouraged pt to take when ambulating due to the use of the seat attached to the rollator if pt needs to rest during her exercise Plan of care and goals discussed with pt being receptive to adhering to the plan of care. RN will also notify pt's provider of her participation in  the [facility] program and services. Will schedule the next home visit and follow up accordingly. Will also inform pt of the ongoing transition of care contacts post op hospital discharge.  Raina Mina, RN Care Management Coordinator [place] Office [phone]

## 2016-02-14 ENCOUNTER — Ambulatory Visit: Payer: PPO | Admitting: Physical Therapy

## 2016-02-14 ENCOUNTER — Encounter: Payer: Self-pay | Admitting: *Deleted

## 2016-02-14 DIAGNOSIS — M6281 Muscle weakness (generalized): Secondary | ICD-10-CM

## 2016-02-14 DIAGNOSIS — G8194 Hemiplegia, unspecified affecting left nondominant side: Secondary | ICD-10-CM

## 2016-02-14 DIAGNOSIS — R262 Difficulty in walking, not elsewhere classified: Secondary | ICD-10-CM

## 2016-02-14 LAB — CUP PACEART REMOTE DEVICE CHECK: MDC IDC SESS DTM: 20170615180813

## 2016-02-14 NOTE — Therapy (Signed)
[place], [place], [address] Phone: [phone]   Fax:  [phone]  Physical Therapy Treatment  Patient Details  Name: JASLYN NANDIN MRN: [id number] Date of Birth: 11/26/1940 Referring Provider: Shelia Media  Encounter Date: 02/14/2016      PT End of Session - 02/14/16 1154    Visit Number 6   Date for PT Re-Evaluation 03/29/16   PT Start Time 1100   PT Stop Time 1147   PT Time Calculation (min) 47 min   Activity Tolerance Patient tolerated treatment well   Behavior During Therapy [facility] for tasks assessed/performed      Past Medical History  Diagnosis Date  . Essential hypertension   . Hypercholesterolemia   . Hypothyroidism   . Vitamin D deficiency   . Palpitations   . History of dizziness   . Mitral regurgitation     a. 12/2015 Echo: EF 60-65%, mild focal basal hypertrophy. No rwma, triv AI, mild MR, mildly dil LA w/o thrombus. No RA thrombus. No PFO.  Marland Kitchen Difficulty walking   . Cerebrovascular accident (stroke) ([place])     a. 12/2015 - cryptogenic.  S/P MDT Linq.    Past Surgical History  Procedure Laterality Date  . Cardiovascular stress test  2002    NORMAL  . Transthoracic echocardiogram  2008    SHOWED LEFT VENTRICULAR HYPERTROPHY AND MILD AORTIC STENOSIS  . Ep implantable device N/A 12/17/2015    Procedure: Loop Recorder Insertion;  Surgeon: [name], MD;  Location: [place] [facility];  Service: Cardiovascular;  Laterality: N/A;  . Tee without cardioversion N/A 12/17/2015    Procedure: TRANSESOPHAGEAL ECHOCARDIOGRAM (TEE);  Surgeon: Lelon Perla, MD;  Location: [facility] ENDOSCOPY;  Service: Cardiovascular;  Laterality: N/A;  Pt also needs a LOOP    There were no vitals filed for this visit.      Subjective Assessment - 02/14/16 1057    Subjective Feeling more awake today, legs felt weak after last session. Mainly left side was tired. No pain today.    Limitations  Standing;Walking;House hold activities   Patient Stated Goals walk and have no difficulty with ADL's                         [facility] Adult PT Treatment/Exercise - 02/14/16 0001    Ambulation/Gait   Gait Comments ambulated HHA 223ft working on increase speed and stride   High Level Balance   High Level Balance Activities Braiding;Direction changes;Turns;Negotitating around obstacles;Negotiating over obstacles   High Level Balance Comments standing on airex ball toss with direction changes 2x10   Knee/Hip Exercises: Aerobic   Other Aerobic Arm bike L1 for 6 min   Knee/Hip Exercises: Machines for Strengthening   Cybex Knee Extension 5# 3x10   Cybex Knee Flexion 20# 3x10   Other Machine Overhead weighted ball toss in sitting 2x10   Knee/Hip Exercises: Standing   Lateral Step Up 1 set;10 reps;Hand Hold: 2;Step Height: 6"   Forward Step Up 1 set;10 reps;Hand Hold: 2;Step Height: 6"   Other Standing Knee Exercises Marching on airex 2 sets of 20    Other Standing Knee Exercises Toe taps to 4 in step 2x20   Knee/Hip Exercises: Seated   Sit to Sand with UE support;2 sets;10 reps                  PT Short Term Goals - 02/06/16 1652  PT SHORT TERM GOAL #1   Title independent with her home HEP from home health   Status Achieved           PT Long Term Goals - 02/13/16 1541    PT LONG TERM GOAL #1   Title decrease TUG time to 20 seconds   Status Achieved   PT LONG TERM GOAL #2   Title increase Berg balance score to 46/56   Status On-going   PT LONG TERM GOAL #3   Title increase LE strength to 4/5   Status On-going   PT LONG TERM GOAL #4   Title walk 700 feet without device, without rest   Status On-going               Plan - 02/14/16 1157    Clinical Impression Statement Pt has better control when sitting back down can decrease speed. Pt had improved coordination in braided walking with LLE. Pt required verbal cueing to clear L foot during  obstacle course and toe taps to step. Pt needed verbal and tactile cueing to increase speed during gait training   Rehab Potential Good      Patient will benefit from skilled therapeutic intervention in order to improve the following deficits and impairments:  Abnormal gait, Decreased activity tolerance, Decreased balance, Decreased mobility, Decreased range of motion, Difficulty walking, Decreased strength  Visit Diagnosis: Muscle weakness (generalized)  Difficulty in walking, not elsewhere classified  Left hemiparesis [facility])     Problem List Patient Active Problem List   Diagnosis Date Noted  . Essential hypertension   . Cerebrovascular accident (stroke) ([place])   . Left-sided weakness 12/28/2015  . Accelerated hypertension 12/28/2015  . Headache 12/28/2015  . Adjustment reaction with anxiety 12/20/2015  . Stroke due to embolism of right middle cerebral artery ([place]) 12/17/2015  . Left hemiparesis ([place])   . Cerebrovascular accident (CVA) due to occlusion of cerebral artery ([place])   . History of CVA with residual deficit   . Prediabetes   . Right Basal ganglia infarction ([place]) 12/14/2015  . Acute CVA (cerebrovascular accident) ([place]) 12/14/2015  . SVT (supraventricular tachycardia) ([place]) 12/14/2015  . Stroke ([place]) 12/13/2015  . Stroke-like symptom 12/13/2015  . History of stroke   . Heart palpitations 09/06/2012  . Hypertension   . Hypercholesterolemia   . Hypothyroidism   . Vitamin D deficiency   . Palpitations   . History of dizziness   . LVH (left ventricular hypertrophy)   . Aortic stenosis   . Mitral regurgitation   . SOB (shortness of breath)   . Difficulty walking     Dallie Piles, SPTA 02/14/2016, 12:00 PM  [place], [place], [address] Phone: [phone]   Fax:  [phone]  Name: ROSHANDA THORNOCK MRN: [id number] Date of Birth: 06-02-1941

## 2016-02-17 ENCOUNTER — Ambulatory Visit (INDEPENDENT_AMBULATORY_CARE_PROVIDER_SITE_OTHER): Payer: PPO | Admitting: *Deleted

## 2016-02-17 ENCOUNTER — Other Ambulatory Visit: Payer: Self-pay | Admitting: *Deleted

## 2016-02-17 DIAGNOSIS — I10 Essential (primary) hypertension: Secondary | ICD-10-CM | POA: Diagnosis not present

## 2016-02-17 DIAGNOSIS — Z8673 Personal history of transient ischemic attack (TIA), and cerebral infarction without residual deficits: Secondary | ICD-10-CM | POA: Diagnosis not present

## 2016-02-17 DIAGNOSIS — I639 Cerebral infarction, unspecified: Secondary | ICD-10-CM | POA: Diagnosis not present

## 2016-02-17 NOTE — Progress Notes (Signed)
Carelink Summary Report / Loop Recorder 

## 2016-02-17 NOTE — Patient Outreach (Signed)
Stacey Ramirez [facility] Stacey Ramirez) Care Management  02/17/2016  Stacey Ramirez 1941/03/02 [id number]  Transition of care  RN spoke with spouse permission provided by the pt who indicates pt is doing well with no encountered problems. Verified pt continues to go to all scheduled appointments and taking all medications as prescribed with no delays. No issue or acute events reported as pt continues to recover well. Plan of acre reviewed with goals and no inquires or request at this time. RN offered to continues transition of care calls and will follow up next week (agreed).   Raina Mina, RN Care Management Coordinator [place] Office [phone]

## 2016-02-18 ENCOUNTER — Ambulatory Visit: Payer: PPO | Admitting: Physical Therapy

## 2016-02-18 DIAGNOSIS — R262 Difficulty in walking, not elsewhere classified: Secondary | ICD-10-CM

## 2016-02-18 DIAGNOSIS — M6281 Muscle weakness (generalized): Secondary | ICD-10-CM | POA: Diagnosis not present

## 2016-02-18 NOTE — Therapy (Signed)
[place], [place], [address] Phone: [phone]   Fax:  [phone]  Physical Therapy Treatment  Patient Details  Name: Stacey Ramirez MRN: [id number] Date of Birth: 1940/10/22 Referring Provider: Shelia Media  Encounter Date: 02/18/2016      PT End of Session - 02/18/16 1156    Visit Number 7   Date for PT Re-Evaluation 03/29/16   PT Start Time 1100   PT Stop Time 1148   PT Time Calculation (min) 48 min   Activity Tolerance Patient tolerated treatment well      Past Medical History  Diagnosis Date  . Essential hypertension   . Hypercholesterolemia   . Hypothyroidism   . Vitamin D deficiency   . Palpitations   . History of dizziness   . Mitral regurgitation     a. 12/2015 Echo: EF 60-65%, mild focal basal hypertrophy. No rwma, triv AI, mild MR, mildly dil LA w/o thrombus. No RA thrombus. No PFO.  Marland Kitchen Difficulty walking   . Cerebrovascular accident (stroke) ([place])     a. 12/2015 - cryptogenic.  S/P MDT Linq.    Past Surgical History  Procedure Laterality Date  . Cardiovascular stress test  2002    NORMAL  . Transthoracic echocardiogram  2008    SHOWED LEFT VENTRICULAR HYPERTROPHY AND MILD AORTIC STENOSIS  . Ep implantable device N/A 12/17/2015    Procedure: Loop Recorder Insertion;  Surgeon: [name], MD;  Location: [place] [facility];  Service: Cardiovascular;  Laterality: N/A;  . Tee without cardioversion N/A 12/17/2015    Procedure: TRANSESOPHAGEAL ECHOCARDIOGRAM (TEE);  Surgeon: Lelon Perla, MD;  Location: [facility] ENDOSCOPY;  Service: Cardiovascular;  Laterality: N/A;  Pt also needs a LOOP    There were no vitals filed for this visit.      Subjective Assessment - 02/18/16 1111    Subjective pt reports no pain. Says she feels like her walking is not as strong.    Limitations Standing;Walking;House hold activities   Patient Stated Goals walk and have no difficulty with ADL's                          [facility] Adult PT Treatment/Exercise - 02/18/16 0001    Ambulation/Gait   Gait Comments ambulated HHA 149ft, 159ft CGA working on increase speed and stride, Forward marches 124ft CGA    Berg Balance Test   Standing Unsupported with Eyes Closed --  Feet shoulder width apart eyes closed x20 secs HHA   Standing Ubsupported with Feet Together --  Heels touching eyes closed x20 seconds HHA   Standing Unsupported, One Foot in Front --  Tandem stance R/L held 20 ea. HHA needed.   High Level Balance   High Level Balance Activities Side stepping;Braiding;Backward walking;Direction changes;Turns;Negotitating around obstacles;Negotiating over obstacles;Figure 8 turns;Marching backwards   High Level Balance Comments standing on airex, standing perturbations x10 R/L   Knee/Hip Exercises: Aerobic   Other Aerobic Arm bike 3 fwd 3 back   Knee/Hip Exercises: Machines for Strengthening   Cybex Knee Extension 10# 3x10   Cybex Knee Flexion 20# 3x10   Cybex Leg Press 20# 2x10   Knee/Hip Exercises: Seated   Sit to Sand with UE support;10 reps;1 set                  PT Short Term Goals - 02/06/16 1652    PT SHORT TERM GOAL #1  Title independent with her home HEP from home health   Status Achieved           PT Long Term Goals - 02/13/16 1541    PT LONG TERM GOAL #1   Title decrease TUG time to 20 seconds   Status Achieved   PT LONG TERM GOAL #2   Title increase Berg balance score to 46/56   Status On-going   PT LONG TERM GOAL #3   Title increase LE strength to 4/5   Status On-going   PT LONG TERM GOAL #4   Title walk 700 feet without device, without rest   Status On-going               Plan - 02/18/16 1157    Clinical Impression Statement Pt required verbal cues to increase step height and to get closer to objects on floor when trying to step over them. Pt had improved coordination in braided walking, side-stepping and walking  backwards. Pt needed verbal and tactile cueing during gait training to increase speed and step length.    Rehab Potential Good   PT Next Visit Plan Continue to work on balance, cut out some strengthening and keep utilizing aspects of the BERG.      Patient will benefit from skilled therapeutic intervention in order to improve the following deficits and impairments:  Abnormal gait, Decreased activity tolerance, Decreased balance, Decreased mobility, Decreased range of motion, Difficulty walking, Decreased strength  Visit Diagnosis: Muscle weakness (generalized)  Difficulty in walking, not elsewhere classified     Problem List Patient Active Problem List   Diagnosis Date Noted  . Essential hypertension   . Cerebrovascular accident (stroke) ([place])   . Left-sided weakness 12/28/2015  . Accelerated hypertension 12/28/2015  . Headache 12/28/2015  . Adjustment reaction with anxiety 12/20/2015  . Stroke due to embolism of right middle cerebral artery ([place]) 12/17/2015  . Left hemiparesis ([place])   . Cerebrovascular accident (CVA) due to occlusion of cerebral artery ([place])   . History of CVA with residual deficit   . Prediabetes   . Right Basal ganglia infarction ([place]) 12/14/2015  . Acute CVA (cerebrovascular accident) ([place]) 12/14/2015  . SVT (supraventricular tachycardia) ([place]) 12/14/2015  . Stroke ([place]) 12/13/2015  . Stroke-like symptom 12/13/2015  . History of stroke   . Heart palpitations 09/06/2012  . Hypertension   . Hypercholesterolemia   . Hypothyroidism   . Vitamin D deficiency   . Palpitations   . History of dizziness   . LVH (left ventricular hypertrophy)   . Aortic stenosis   . Mitral regurgitation   . SOB (shortness of breath)   . Difficulty walking     Dallie Piles , [place]  02/18/2016, 12:29 PM  [place], [place], [address] Phone: [phone]   Fax:  [phone]  Name: Stacey Ramirez MRN: [id number] Date of Birth: [birth year]

## 2016-02-19 ENCOUNTER — Ambulatory Visit: Payer: PPO | Admitting: Physical Therapy

## 2016-02-19 DIAGNOSIS — M6281 Muscle weakness (generalized): Secondary | ICD-10-CM | POA: Diagnosis not present

## 2016-02-19 DIAGNOSIS — R262 Difficulty in walking, not elsewhere classified: Secondary | ICD-10-CM

## 2016-02-19 NOTE — Therapy (Signed)
[place], [place], [address] Phone: [phone]   Fax:  [phone]  Physical Therapy Treatment  Patient Details  Name: Stacey Ramirez MRN: [id number] Date of Birth: March 14, 1941 Referring Provider: Shelia Media  Encounter Date: 02/19/2016      PT End of Session - 02/19/16 1438    Visit Number 8   Date for PT Re-Evaluation 03/29/16   PT Start Time 1345   PT Stop Time 1430   PT Time Calculation (min) 45 min   Activity Tolerance Patient tolerated treatment well   Behavior During Therapy [facility] for tasks assessed/performed      Past Medical History  Diagnosis Date  . Essential hypertension   . Hypercholesterolemia   . Hypothyroidism   . Vitamin D deficiency   . Palpitations   . History of dizziness   . Mitral regurgitation     a. 12/2015 Echo: EF 60-65%, mild focal basal hypertrophy. No rwma, triv AI, mild MR, mildly dil LA w/o thrombus. No RA thrombus. No PFO.  Marland Kitchen Difficulty walking   . Cerebrovascular accident (stroke) ([place])     a. 12/2015 - cryptogenic.  S/P MDT Linq.    Past Surgical History  Procedure Laterality Date  . Cardiovascular stress test  2002    NORMAL  . Transthoracic echocardiogram  2008    SHOWED LEFT VENTRICULAR HYPERTROPHY AND MILD AORTIC STENOSIS  . Ep implantable device N/A 12/17/2015    Procedure: Loop Recorder Insertion;  Surgeon: [name], MD;  Location: [place] CV LAB;  Service: Cardiovascular;  Laterality: N/A;  . Tee without cardioversion N/A 12/17/2015    Procedure: TRANSESOPHAGEAL ECHOCARDIOGRAM (TEE);  Surgeon: Lelon Perla, MD;  Location: [facility] ENDOSCOPY;  Service: Cardiovascular;  Laterality: N/A;  Pt also needs a LOOP    There were no vitals filed for this visit.      Subjective Assessment - 02/19/16 1611    Subjective Pt reports no pain, that she walked a lot today at home and is feeling a little weak.    Limitations Standing;Walking;House hold  activities   Patient Stated Goals walk and have no difficulty with ADL's                         [facility] Adult PT Treatment/Exercise - 02/19/16 0001    Ambulation/Gait   Gait Comments ambulated HHA 136ft, 129ft CGA working on increase speed and stride, Forward marches 131ft CGA    High Level Balance   High Level Balance Activities Side stepping;Braiding;Backward walking;Negotitating around obstacles;Weight-shifting turns;Negotiating over obstacles   High Level Balance Comments forward step up to 6in, descend to airex x5 HHA required   Lumbar Exercises: Seated   Other Seated Lumbar Exercises Overhead press with weighted yellow ball, emphasis on keeping upright   Knee/Hip Exercises: Aerobic   Nustep L 5 7 min   Knee/Hip Exercises: Machines for Strengthening   Cybex Knee Extension 10# 3x10   Cybex Knee Flexion 20# 3x10   Knee/Hip Exercises: Seated   Sit to Sand 10 reps;with UE support                  PT Short Term Goals - 02/06/16 1652    PT SHORT TERM GOAL #1   Title independent with her home HEP from home health   Status Achieved           PT Long Term Goals - 02/13/16 1541  PT LONG TERM GOAL #1   Title decrease TUG time to 20 seconds   Status Achieved   PT LONG TERM GOAL #2   Title increase Berg balance score to 46/56   Status On-going   PT LONG TERM GOAL #3   Title increase LE strength to 4/5   Status On-going   PT LONG TERM GOAL #4   Title walk 700 feet without device, without rest   Status On-going               Plan - 02/19/16 1608    Clinical Impression Statement Pt required verbal cues to increase step height for foot clearance over obstacles. Pt improved arm swing in gait training activity. Altered surfaces are still causing LOB, but braided walking and side stepping have improved.    Rehab Potential Good   PT Frequency 2x / week   PT Duration 8 weeks   PT Treatment/Interventions ADLs/Self Care Home Management;Gait  training;Stair training;Functional mobility training;Therapeutic activities;Therapeutic exercise;Manual techniques;Patient/family education;Neuromuscular re-education;Balance training   PT Next Visit Plan Go to stairwell and work on ascending/descending stairs. Continue walking balance exercises, obstacles, working on foot clearance without cues.      Patient will benefit from skilled therapeutic intervention in order to improve the following deficits and impairments:  Abnormal gait, Decreased activity tolerance, Decreased balance, Decreased mobility, Decreased range of motion, Difficulty walking, Decreased strength  Visit Diagnosis: Difficulty in walking, not elsewhere classified  Muscle weakness (generalized)     Problem List Patient Active Problem List   Diagnosis Date Noted  . Essential hypertension   . Cerebrovascular accident (stroke) ([place])   . Left-sided weakness 12/28/2015  . Accelerated hypertension 12/28/2015  . Headache 12/28/2015  . Adjustment reaction with anxiety 12/20/2015  . Stroke due to embolism of right middle cerebral artery ([place]) 12/17/2015  . Left hemiparesis ([place])   . Cerebrovascular accident (CVA) due to occlusion of cerebral artery ([place])   . History of CVA with residual deficit   . Prediabetes   . Right Basal ganglia infarction ([place]) 12/14/2015  . Acute CVA (cerebrovascular accident) ([place]) 12/14/2015  . SVT (supraventricular tachycardia) ([place]) 12/14/2015  . Stroke ([place]) 12/13/2015  . Stroke-like symptom 12/13/2015  . History of stroke   . Heart palpitations 09/06/2012  . Hypertension   . Hypercholesterolemia   . Hypothyroidism   . Vitamin D deficiency   . Palpitations   . History of dizziness   . LVH (left ventricular hypertrophy)   . Aortic stenosis   . Mitral regurgitation   . SOB (shortness of breath)   . Difficulty walking     Dallie Piles 02/19/2016, [place] [address] [place], [place], [address] Phone: [phone]   Fax:  [phone]  Name: Stacey Ramirez MRN: [id number] Date of Birth: 22-Jan-1941

## 2016-02-20 ENCOUNTER — Ambulatory Visit: Payer: PPO | Admitting: Physical Therapy

## 2016-02-20 DIAGNOSIS — R262 Difficulty in walking, not elsewhere classified: Secondary | ICD-10-CM

## 2016-02-20 DIAGNOSIS — M6281 Muscle weakness (generalized): Secondary | ICD-10-CM

## 2016-02-20 NOTE — Therapy (Cosign Needed)
[place], [place], [address] Phone: [phone]   Fax:  [phone]  Physical Therapy Treatment  Patient Details  Name: Stacey Ramirez MRN: [id number] Date of Birth: September 10, 1940 Referring Provider: Shelia Media  Encounter Date: 02/20/2016      PT End of Session - 02/20/16 1200    Visit Number 9   Date for PT Re-Evaluation 03/29/16   PT Start Time 1100   PT Stop Time 1147   PT Time Calculation (min) 47 min   Activity Tolerance Patient tolerated treatment well   Behavior During Therapy [facility] for tasks assessed/performed      Past Medical History  Diagnosis Date  . Essential hypertension   . Hypercholesterolemia   . Hypothyroidism   . Vitamin D deficiency   . Palpitations   . History of dizziness   . Mitral regurgitation     a. 12/2015 Echo: EF 60-65%, mild focal basal hypertrophy. No rwma, triv AI, mild MR, mildly dil LA w/o thrombus. No RA thrombus. No PFO.  Marland Kitchen Difficulty walking   . Cerebrovascular accident (stroke) ([place])     a. 12/2015 - cryptogenic.  S/P MDT Linq.    Past Surgical History  Procedure Laterality Date  . Cardiovascular stress test  2002    NORMAL  . Transthoracic echocardiogram  2008    SHOWED LEFT VENTRICULAR HYPERTROPHY AND MILD AORTIC STENOSIS  . Ep implantable device N/A 12/17/2015    Procedure: Loop Recorder Insertion;  Surgeon: [name], MD;  Location: [place] CV LAB;  Service: Cardiovascular;  Laterality: N/A;  . Tee without cardioversion N/A 12/17/2015    Procedure: TRANSESOPHAGEAL ECHOCARDIOGRAM (TEE);  Surgeon: Lelon Perla, MD;  Location: [facility] ENDOSCOPY;  Service: Cardiovascular;  Laterality: N/A;  Pt also needs a LOOP    There were no vitals filed for this visit.      Subjective Assessment - 02/20/16 1058    Subjective Pt reports L arm was a little sore this morning and that her legs are a little shaky today.    Limitations Standing;Walking;House hold  activities   Patient Stated Goals walk and have no difficulty with ADL's                         [facility] Adult PT Treatment/Exercise - 02/20/16 0001    Ambulation/Gait   Ambulation Distance (Feet) 200 Feet   Assistive device None   Stairs Yes   Stairs Assistance --   Stair Management Technique One rail Right   Number of Stairs 24   Height of Stairs 6   Gait Comments ambulated 255ft in zig zag pattern with emphasis on increasing speed and using bigger steps   High Level Balance   High Level Balance Activities Side stepping;Direction changes;Sudden stops;Backward walking;Negotiating over obstacles   High Level Balance Comments Forward toe taps and step ups to 6in step 20 reps ea.    Lumbar Exercises: Seated   Sit to Stand 10 reps  On airex   Other Seated Lumbar Exercises Overhead press ups with yellow weighted ball 2x10    Knee/Hip Exercises: Aerobic   Other Aerobic Arm bike 3 fwd 3 back   Knee/Hip Exercises: Machines for Strengthening   Cybex Knee Extension 10# 3x10   Cybex Knee Flexion 20# 3x10   Knee/Hip Exercises: Standing   Other Standing Knee Exercises Marching on airex 2 sets of 20    Other Standing Knee Exercises Standing  reaches with yellow weighted ball to SPTA hand 2x10   Knee/Hip Exercises: Seated   Other Seated Knee/Hip Exercises Seated trunk rotations with weighted yellow ball to SPTA hand seated behind pt                  PT Short Term Goals - 02/06/16 1652    PT SHORT TERM GOAL #1   Title independent with her home HEP from home health   Status Achieved           PT Long Term Goals - 02/13/16 1541    PT LONG TERM GOAL #1   Title decrease TUG time to 20 seconds   Status Achieved   PT LONG TERM GOAL #2   Title increase Berg balance score to 46/56   Status On-going   PT LONG TERM GOAL #3   Title increase LE strength to 4/5   Status On-going   PT LONG TERM GOAL #4   Title walk 700 feet without device, without rest   Status  On-going               Plan - 02/20/16 1201    Clinical Impression Statement Pt required less verbal cues when negotiating obstacles today. Pt had improved control and did not require HHA balance activities and gait training. Pt had one LOB to R side when surface changed in obstacle course, needed assistance to regain balance. Pt needed verbal cues to increase step length when walking backwards.     Rehab Potential Good   PT Frequency 2x / week   PT Duration 8 weeks   PT Treatment/Interventions ADLs/Self Care Home Management;Gait training;Stair training;Functional mobility training;Therapeutic activities;Therapeutic exercise;Manual techniques;Patient/family education;Neuromuscular re-education;Balance training   PT Next Visit Plan Implement more aspects of BERG to foster improving score. Work on endurance in ambulation to increase distance without a break or assist. Implement high stepping exercise.       Patient will benefit from skilled therapeutic intervention in order to improve the following deficits and impairments:  Abnormal gait, Decreased activity tolerance, Decreased balance, Decreased mobility, Decreased range of motion, Difficulty walking, Decreased strength  Visit Diagnosis: Difficulty in walking, not elsewhere classified  Muscle weakness (generalized)     Problem List Patient Active Problem List   Diagnosis Date Noted  . Essential hypertension   . Cerebrovascular accident (stroke) ([place])   . Left-sided weakness 12/28/2015  . Accelerated hypertension 12/28/2015  . Headache 12/28/2015  . Adjustment reaction with anxiety 12/20/2015  . Stroke due to embolism of right middle cerebral artery ([place]) 12/17/2015  . Left hemiparesis ([place])   . Cerebrovascular accident (CVA) due to occlusion of cerebral artery ([place])   . History of CVA with residual deficit   . Prediabetes   . Right Basal ganglia infarction ([place]) 12/14/2015  . Acute CVA (cerebrovascular accident) ([place])  12/14/2015  . SVT (supraventricular tachycardia) ([place]) 12/14/2015  . Stroke ([place]) 12/13/2015  . Stroke-like symptom 12/13/2015  . History of stroke   . Heart palpitations 09/06/2012  . Hypertension   . Hypercholesterolemia   . Hypothyroidism   . Vitamin D deficiency   . Palpitations   . History of dizziness   . LVH (left ventricular hypertrophy)   . Aortic stenosis   . Mitral regurgitation   . SOB (shortness of breath)   . Difficulty walking     Dallie Piles, [place] 02/20/2016, 12:11 PM  [place], [place], [address]  Phone: [phone]   Fax:  [phone]  Name: Stacey Ramirez MRN: [id number] Date of Birth: 27-Dec-1940

## 2016-02-24 ENCOUNTER — Encounter: Payer: Self-pay | Admitting: Physical Therapy

## 2016-02-24 ENCOUNTER — Ambulatory Visit: Payer: PPO | Admitting: Physical Therapy

## 2016-02-24 DIAGNOSIS — G8194 Hemiplegia, unspecified affecting left nondominant side: Secondary | ICD-10-CM

## 2016-02-24 DIAGNOSIS — M6281 Muscle weakness (generalized): Secondary | ICD-10-CM

## 2016-02-24 DIAGNOSIS — R262 Difficulty in walking, not elsewhere classified: Secondary | ICD-10-CM

## 2016-02-24 NOTE — Therapy (Signed)
[place], [place], [address] Phone: [phone]   Fax:  [phone]  Physical Therapy Treatment  Patient Details  Name: Stacey Ramirez MRN: [id number] Date of Birth: 1941-02-11 Referring Provider: Shelia Media  Encounter Date: 02/24/2016      PT End of Session - 02/24/16 1233    Visit Number 10   Date for PT Re-Evaluation 03/29/16   PT Start Time 1145   PT Stop Time 1230   PT Time Calculation (min) 45 min   Activity Tolerance Patient tolerated treatment well   Behavior During Therapy [facility] for tasks assessed/performed      Past Medical History:  Diagnosis Date  . Cerebrovascular accident (stroke) ([place])    a. 12/2015 - cryptogenic.  S/P MDT Linq.  . Difficulty walking   . Essential hypertension   . History of dizziness   . Hypercholesterolemia   . Hypothyroidism   . Mitral regurgitation    a. 12/2015 Echo: EF 60-65%, mild focal basal hypertrophy. No rwma, triv AI, mild MR, mildly dil LA w/o thrombus. No RA thrombus. No PFO.  Marland Kitchen Palpitations   . Vitamin D deficiency     Past Surgical History:  Procedure Laterality Date  . CARDIOVASCULAR STRESS TEST  2002   NORMAL  . EP IMPLANTABLE DEVICE N/A 12/17/2015   Procedure: Loop Recorder Insertion;  Surgeon: [name], MD;  Location: [place] CV LAB;  Service: Cardiovascular;  Laterality: N/A;  . TEE WITHOUT CARDIOVERSION N/A 12/17/2015   Procedure: TRANSESOPHAGEAL ECHOCARDIOGRAM (TEE);  Surgeon: Lelon Perla, MD;  Location: [facility] ENDOSCOPY;  Service: Cardiovascular;  Laterality: N/A;  Pt also needs a LOOP  . TRANSTHORACIC ECHOCARDIOGRAM  2008   SHOWED LEFT VENTRICULAR HYPERTROPHY AND MILD AORTIC STENOSIS    There were no vitals filed for this visit.      Subjective Assessment - 02/24/16 1149    Subjective "Im a cripple [place] today" I think we overdid it Saturday we walked in the mall and Donnald Garre been tired ever since"   Currently in Pain? No/denies    Multiple Pain Sites No                         [facility] Adult PT Treatment/Exercise - 02/24/16 0001      High Level Balance   High Level Balance Activities Negotiating over obstacles   High Level Balance Comments Standing on airex with ball toss; lateral stde step overs; standing on airex eyes closes 3x10 sec      Lumbar Exercises: Seated   Sit to Stand 10 reps  x2   Other Seated Lumbar Exercises Overhead press ups with yellow weighted ball 2x10      Knee/Hip Exercises: Aerobic   Nustep L 5 6 min   Other Aerobic Arm bike 2 fwd 2 back     Knee/Hip Exercises: Machines for Strengthening   Cybex Knee Extension 10# 2x15   Cybex Knee Flexion 20# 2x15     Knee/Hip Exercises: Standing   Other Standing Knee Exercises Marching on airex 2 sets of 20                   PT Short Term Goals - 02/06/16 1652      PT SHORT TERM GOAL #1   Title independent with her home HEP from home health   Status Achieved           PT Long Term Goals -  02/13/16 1541      PT LONG TERM GOAL #1   Title decrease TUG time to 20 seconds   Status Achieved     PT LONG TERM GOAL #2   Title increase Berg balance score to 46/56   Status On-going     PT LONG TERM GOAL #3   Title increase LE strength to 4/5   Status On-going     PT LONG TERM GOAL #4   Title walk 700 feet without device, without rest   Status On-going               Plan - 02/24/16 1234    Clinical Impression Statement Pt able to complete all of today's interventions. Pt challenged with airex standing with eyes closed. Pt with some instability with standing march on airex. Performed standing ball toss well. Pt also with some clarence issues negotiating over obstacles. Pt does fatigue quickly with sit to stands.   Rehab Potential Good   PT Frequency 2x / week   PT Duration 8 weeks   PT Treatment/Interventions ADLs/Self Care Home Management;Gait training;Stair training;Functional mobility  training;Therapeutic activities;Therapeutic exercise;Manual techniques;Patient/family education;Neuromuscular re-education;Balance training   PT Next Visit Plan Implement more aspects of BERG to foster improving score. Work on endurance in ambulation to increase distance without a break or assist. Implement high stepping exercise.       Patient will benefit from skilled therapeutic intervention in order to improve the following deficits and impairments:  Abnormal gait, Decreased activity tolerance, Decreased balance, Decreased mobility, Decreased range of motion, Difficulty walking, Decreased strength  Visit Diagnosis: Difficulty in walking, not elsewhere classified  Muscle weakness (generalized)  Left hemiparesis ([place])     Problem List Patient Active Problem List   Diagnosis Date Noted  . Essential hypertension   . Cerebrovascular accident (stroke) ([place])   . Left-sided weakness 12/28/2015  . Accelerated hypertension 12/28/2015  . Headache 12/28/2015  . Adjustment reaction with anxiety 12/20/2015  . Stroke due to embolism of right middle cerebral artery ([place]) 12/17/2015  . Left hemiparesis ([place])   . Cerebrovascular accident (CVA) due to occlusion of cerebral artery ([place])   . History of CVA with residual deficit   . Prediabetes   . Right Basal ganglia infarction ([place]) 12/14/2015  . Acute CVA (cerebrovascular accident) ([place]) 12/14/2015  . SVT (supraventricular tachycardia) ([place]) 12/14/2015  . Stroke ([place]) 12/13/2015  . Stroke-like symptom 12/13/2015  . History of stroke   . Heart palpitations 09/06/2012  . Hypertension   . Hypercholesterolemia   . Hypothyroidism   . Vitamin D deficiency   . Palpitations   . History of dizziness   . LVH (left ventricular hypertrophy)   . Aortic stenosis   . Mitral regurgitation   . SOB (shortness of breath)   . Difficulty walking     Scot Jun, PTA  02/24/2016, 12:40 PM  [place], [place], [address] Phone: [phone]   Fax:  [phone]  Name: AYONNA RUTHERFORD MRN: [id number] Date of Birth: Jan 14, 1941

## 2016-02-25 ENCOUNTER — Other Ambulatory Visit: Payer: Self-pay | Admitting: *Deleted

## 2016-02-25 NOTE — Patient Outreach (Addendum)
[facility]) Care Management  02/25/2016  Stacey Ramirez 09-05-1940 [id number]   RN spoke with pt's spouse (primary caregiver) who indicates pt is making slow progress but involved with PT weekly to build her strength. Reports no other delays or issues as pt continues to attend all appointments and take all prescribed medications in her ongoing recovery. Spouse confirms pt is doing well and varies pt has not had any falls or injuries due to her ongoing weakness and continues to work with the therapist (RN continues to encourage adherence).  RN reminded pt caregiver of her upcoming appointments and will follow up accordingly. Aware that this is the last transition of care call however if pt in need of an acute visit to contact this RN to accommodate a home visit. No other inquires at this time.  Raina Mina, RN Care Management Coordinator [place] Office [phone]

## 2016-02-26 ENCOUNTER — Ambulatory Visit: Payer: PPO | Admitting: Physical Therapy

## 2016-02-26 DIAGNOSIS — M6281 Muscle weakness (generalized): Secondary | ICD-10-CM | POA: Diagnosis not present

## 2016-02-26 DIAGNOSIS — R262 Difficulty in walking, not elsewhere classified: Secondary | ICD-10-CM

## 2016-02-26 DIAGNOSIS — G8194 Hemiplegia, unspecified affecting left nondominant side: Secondary | ICD-10-CM

## 2016-02-26 NOTE — Therapy (Signed)
[facility] [place], [place], [address] Phone: [phone]   Fax:  [phone]  Physical Therapy Treatment  Patient Details  Name: Stacey Ramirez MRN: [id number] Date of Birth: [birth year] Referring Provider: Shelia Media  Encounter Date: 02/26/2016      PT End of Session - 02/26/16 1248    Visit Number 11   Date for PT Re-Evaluation 03/29/16   PT Start Time [id number]   PT Stop Time 1236   PT Time Calculation (min) 51 min      Past Medical History:  Diagnosis Date  . Cerebrovascular accident (stroke) ([place])    a. 12/2015 - cryptogenic.  S/P MDT Linq.  . Difficulty walking   . Essential hypertension   . History of dizziness   . Hypercholesterolemia   . Hypothyroidism   . Mitral regurgitation    a. 12/2015 Echo: EF 60-65%, mild focal basal hypertrophy. No rwma, triv AI, mild MR, mildly dil LA w/o thrombus. No RA thrombus. No PFO.  Marland Kitchen Palpitations   . Vitamin D deficiency     Past Surgical History:  Procedure Laterality Date  . CARDIOVASCULAR STRESS TEST  2002   NORMAL  . EP IMPLANTABLE DEVICE N/A 12/17/2015   Procedure: Loop Recorder Insertion;  Surgeon: [name], MD;  Location: [facility];  Service: Cardiovascular;  Laterality: N/A;  . TEE WITHOUT CARDIOVERSION N/A 12/17/2015   Procedure: TRANSESOPHAGEAL ECHOCARDIOGRAM (TEE);  Surgeon: Lelon Perla, MD;  Location: [facility];  Service: Cardiovascular;  Laterality: N/A;  Pt also needs a LOOP  . TRANSTHORACIC ECHOCARDIOGRAM  2008   SHOWED LEFT VENTRICULAR HYPERTROPHY AND MILD AORTIC STENOSIS    There were no vitals filed for this visit.      Subjective Assessment - 02/26/16 1147    Subjective Feels worse than before, after going to mall to walk around her legs feel really weak.    Limitations Standing;Walking;House hold activities   Patient Stated Goals walk and have no difficulty with ADL's   Currently in Pain? No/denies                          [facility] Adult PT Treatment/Exercise - 02/26/16 0001      Ambulation/Gait   Ambulation Distance (Feet) 200 Feet   Gait Comments Emphasis on increasing speed, pt shows decreased step length, bilaterally, arm swing is improved.     Berg Balance Test   Standing Unsupported --  x30secs SBA no LOB   Standing Unsupported with Eyes Closed --  x30 secs SBA no LOB   Standing Ubsupported with Feet Together --  x30 secs SBA LOB x1 self corrected to R   Standing Unsupported, One Foot in Front --  CGA, R/L x30 secs, LOB x1 to the L self corrected      High Level Balance   High Level Balance Activities Direction changes;Turns;Sudden stops;Head turns;Negotiating over obstacles  Picking up cones x3    High Level Balance Comments Standing on airex ball toss 2x10, weighted yello ball x10     Lumbar Exercises: Seated   Sit to Stand 10 reps  On airex   Other Seated Lumbar Exercises Overhead press ups with yellow weighted ball 2x10      Knee/Hip Exercises: Aerobic   Nustep L 5 6 min   Other Aerobic Arm bike 2 fwd 2 back     Knee/Hip Exercises: Standing   Forward Step Up Both;2 sets;10  reps  with 3lb ankle weights to 6in step   Forward Step Up Limitations To 8in step toe taps only with no weights   SLS R leg 4 secs, L leg 0 secs   Other Standing Knee Exercises Standing reaching out of BOS to SPTA hand 2x10                  PT Short Term Goals - 02/06/16 1652      PT SHORT TERM GOAL #1   Title independent with her home HEP from home health   Status Achieved           PT Long Term Goals - 02/13/16 1541      PT LONG TERM GOAL #1   Title decrease TUG time to 20 seconds   Status Achieved     PT LONG TERM GOAL #2   Title increase Berg balance score to 46/56   Status On-going     PT LONG TERM GOAL #3   Title increase LE strength to 4/5   Status On-going     PT LONG TERM GOAL #4   Title walk 700 feet without device, without rest    Status On-going               Plan - 02/26/16 1249    Clinical Impression Statement Pt had instability when attempting balance activities with eyes closed and altering BOS. Pt required verbal cues for sit to stand to decrease posterior leaning. Pt has improved in obstacle course with foot clearance and not requiring HHA.    Rehab Potential Good   PT Frequency 2x / week   PT Duration 8 weeks   PT Treatment/Interventions ADLs/Self Care Home Management;Gait training;Stair training;Functional mobility training;Therapeutic activities;Therapeutic exercise;Manual techniques;Patient/family education;Neuromuscular re-education;Balance training   PT Next Visit Plan Keep working on aspects of the BERG to foster improved score, work on high stepping activities and increasing gait speed.       Patient will benefit from skilled therapeutic intervention in order to improve the following deficits and impairments:  Abnormal gait, Decreased activity tolerance, Decreased balance, Decreased mobility, Decreased range of motion, Difficulty walking, Decreased strength  Visit Diagnosis: Difficulty in walking, not elsewhere classified  Left hemiparesis (HCC)  Muscle weakness (generalized)     Problem List Patient Active Problem List   Diagnosis Date Noted  . Essential hypertension   . Cerebrovascular accident (stroke) ([place])   . Left-sided weakness 12/28/2015  . Accelerated hypertension 12/28/2015  . Headache 12/28/2015  . Adjustment reaction with anxiety 12/20/2015  . Stroke due to embolism of right middle cerebral artery ([place]) 12/17/2015  . Left hemiparesis ([place])   . Cerebrovascular accident (CVA) due to occlusion of cerebral artery ([place])   . History of CVA with residual deficit   . Prediabetes   . Right Basal ganglia infarction ([place]) 12/14/2015  . Acute CVA (cerebrovascular accident) ([place]) 12/14/2015  . SVT (supraventricular tachycardia) ([place]) 12/14/2015  . Stroke ([place]) 12/13/2015  .  Stroke-like symptom 12/13/2015  . History of stroke   . Heart palpitations 09/06/2012  . Hypertension   . Hypercholesterolemia   . Hypothyroidism   . Vitamin D deficiency   . Palpitations   . History of dizziness   . LVH (left ventricular hypertrophy)   . Aortic stenosis   . Mitral regurgitation   . SOB (shortness of breath)   . Difficulty walking     Dallie Piles, SPTA 02/26/2016, 12:57 PM  [facility]  [place], [place], [address] Phone: [phone]   Fax:  [phone]  Name: Stacey Ramirez MRN: [id number] Date of Birth: [birth year]

## 2016-02-27 ENCOUNTER — Ambulatory Visit (INDEPENDENT_AMBULATORY_CARE_PROVIDER_SITE_OTHER): Payer: PPO | Admitting: Neurology

## 2016-02-27 ENCOUNTER — Encounter: Payer: Self-pay | Admitting: Neurology

## 2016-02-27 VITALS — BP 140/78 | HR 78 | Ht 62.0 in | Wt 115.8 lb

## 2016-02-27 DIAGNOSIS — E785 Hyperlipidemia, unspecified: Secondary | ICD-10-CM

## 2016-02-27 DIAGNOSIS — I63231 Cerebral infarction due to unspecified occlusion or stenosis of right carotid arteries: Secondary | ICD-10-CM

## 2016-02-27 DIAGNOSIS — I6523 Occlusion and stenosis of bilateral carotid arteries: Secondary | ICD-10-CM

## 2016-02-27 DIAGNOSIS — I10 Essential (primary) hypertension: Secondary | ICD-10-CM

## 2016-02-27 HISTORY — DX: Occlusion and stenosis of bilateral carotid arteries: I65.23

## 2016-02-27 NOTE — Patient Instructions (Signed)
-   continue ASA and plavix for one more month and then plavix alone for stroke prevention - continue praluent for HLD and stroke prevention - continue PT/OT and gradually increase work load. Avoid over exertion - Follow up with your primary care physician for stroke risk factor modification. Recommend maintain blood pressure goal <130/80, diabetes with hemoglobin A1c goal below 7.0% and lipids with LDL cholesterol goal below 70 mg/dL.  - No restriction for driving, but recommend to drive with family members on board for 2-3 times initially. If both parties feel comfortable of your driving, you can drive alone after. However, you are recommended to drive during the day not at night, no long distance and drive in familiar roads. - check BP at home and record - continue loop recorder monitoring - follow up in 4 months.

## 2016-02-27 NOTE — Progress Notes (Signed)
STROKE NEUROLOGY FOLLOW UP NOTE  NAME: Stacey Ramirez DOB: 1941/06/16  REASON FOR VISIT: stroke follow up HISTORY FROM: pt and husband and chart  Today we had the pleasure of seeing Stacey Ramirez in follow-up at our Neurology Clinic. Pt was accompanied by husband.   History Summary Stacey Ramirez is a 75 y.o. female with history of hypertension, hyperlipidemia, hypothyroidism, aortic stenosis, mitral regurgitation, and previous stroke admitted on 12/13/15 for left hemiparesis. MRI showed acute moderate large infarct in the right BG as well as a punctate infarct periventricular WM adjacent to right occipital horn, more embolic pattern. MRA showed moderate stenosis of the supraclinoid right ICA, right P1 and left P2. CUS, TTE, DVT screening, TEE all negative. A1C 5.7 and LDL 238. Loop recorder placed. She was discharged to CIR with DAPT and crestor.   Interval History During the interval time, the patient has been doing well. She stayed in CIR for one week and discharged home with home PT/OT and now on outpt PT/OT. She still not able to tolerate statin and was put now on praluent. On 12/27/15 she was admitted for HA and muscle weakness. She was treated for high BP and crestor discontinued on discharge. She went back to ER on 01/01/16 due to generalized weakness, SOB and near fall. CT head showed no acute changes. Felt related to stress and anxiety. Since then, pt continue to feel easy fatigue, generalized weakness and depressed mood. She was put on zoloft. She continues to work with PT/OT and currently gradually recovering. BP today 140/78. Loop recorder so far no afib.  REVIEW OF SYSTEMS: Full 14 system review of systems performed and notable only for those listed below and in HPI above, all others are negative:  Constitutional:  fatigue Cardiovascular: murmur Ear/Nose/Throat:  Runny nose, drooling Skin:  Eyes:   Respiratory:  cough Gastroitestinal:   Genitourinary: incontinence of  bladder Hematology/Lymphatic:  Bruise/bleed easily Endocrine: cold intolerance Musculoskeletal:   Allergy/Immunology:   Neurological:  Memory loss, HA, weakness Psychiatric: depression, nervous/anxious Sleep: daytime sleepiness  The following represents the patient's updated allergies and side effects list: Allergies  Allergen Reactions  . Sulfa Drugs Cross Reactors Itching  . Zocor [Simvastatin] Other (See Comments)    Feels like she is going to pass out, weakness  . Crestor [Rosuvastatin Calcium]     Muscle weakness  . Micardis [Telmisartan] Other (See Comments)    Feels like she is going to pass out    The neurologically relevant items on the patient's problem list were reviewed on today's visit.  Neurologic Examination  A problem focused neurological exam (12 or more points of the single system neurologic examination, vital signs counts as 1 point, cranial nerves count for 8 points) was performed.  Blood pressure 140/78, pulse 78, height 5\' 2"  (1.575 m), weight 115 lb 12.8 oz (52.5 kg).  General - Well nourished, well developed, in no apparent distress.  Ophthalmologic - Sharp disc margins OU.   Cardiovascular - Regular rate and rhythm with no murmur.  Mental Status -  Level of arousal and orientation to time, place, and person were intact. Language including expression, naming, repetition, comprehension was assessed and found intact. Fund of Knowledge was assessed and was intact.  Cranial Nerves II - XII - II - Visual field intact OU. III, IV, VI - Extraocular movements intact. V - Facial sensation intact bilaterally. VII - Facial movement intact bilaterally. VIII - Hearing & vestibular intact bilaterally. X - Palate elevates  symmetrically. XI - Chin turning & shoulder shrug intact bilaterally. XII - Tongue protrusion intact.  Motor Strength - The patient's strength was normal in all extremities and pronator drift was absent.  Bulk was normal and fasciculations  were absent.   Motor Tone - Muscle tone was assessed at the neck and appendages and was normal.  Reflexes - The patient's reflexes were 1+ in all extremities and she had no pathological reflexes.  Sensory - Light touch, temperature/pinprick, vibration and proprioception, and Romberg testing were assessed and were normal.    Coordination - The patient had normal movements in the hands and feet with no ataxia or dysmetria.  Tremor was absent.  Gait and Station - The patient's transfers, posture, gait, station, and turns were observed as normal.   Functional score  mRS = 0   0 - No symptoms.   1 - No significant disability. Able to carry out all usual activities, despite some symptoms.   2 - Slight disability. Able to look after own affairs without assistance, but unable to carry out all previous activities.   3 - Moderate disability. Requires some help, but able to walk unassisted.   4 - Moderately severe disability. Unable to attend to own bodily needs without assistance, and unable to walk unassisted.   5 - Severe disability. Requires constant nursing care and attention, bedridden, incontinent.   6 - Dead.   NIH Stroke Scale = 0   Data reviewed: I personally reviewed the images and agree with the radiology interpretations.  Ct Head Wo Contrast 12/13/2015   No acute abnormality. Atrophy and chronic microvascular ischemic change.   Mri and Mra Head/brain Wo Cm 12/13/2015   1. Confluent acute infarct in the right basal ganglia with no associated hemorrhage or mass effect. Superimposed punctate posterior right MCA periventricular white matter infarct.  2. Negative for emergent large vessel occlusion. Anterior circulation atherosclerosis appears stable since 2014, including at least moderate stenosis of the supraclinoid right ICA (series 705, image 6).  3. Mild progression of posterior circulation atherosclerosis since 2014 including new mild stenosis of the mid basilar  artery and moderate stenosis of the right PCA P1 segment.  4. Underlying chronic small vessel ischemia, moderate for age.   LE venous doppler - Lower extremity venous duplex has been completed. Preliminary findings: No evidence of DVT or baker's cyst.  CUS - Bilateral: 1-39% ICA stenosis. Vertebral artery flow is antegrade.  TTE - Left ventricle: The cavity size was normal. Wall thickness was  increased in a pattern of mild LVH. Systolic function was normal.  The estimated ejection fraction was in the range of 60% to 65%.  Wall motion was normal; there were no regional wall motion  abnormalities. Doppler parameters are consistent with abnormal  left ventricular relaxation (grade 1 diastolic dysfunction). - Aortic valve: There was no stenosis. - Mitral valve: There was no significant regurgitation. - Right ventricle: The cavity size was normal. Systolic function  was normal. - Pulmonary arteries: No complete TR doppler jet so unable to  estimate PA systolic pressure. - Inferior vena cava: The vessel was normal in size. The  respirophasic diameter changes were in the normal range (>= 50%),  consistent with normal central venous pressure. Impressions: - Normal LV size with mild LV hypertrophy. EF 60-65%. Normal RV  size and systolic function. No significant valvular  abnormalities.  TEE - normal LV function; negative saline microcavitation study  CT head 12/28/15 1. Expected evolution of recent right basal ganglia  infarct. No hemorrhagic transformation, increased size or mass effect. 2. Background atrophy and chronic small vessel ischemia, unchanged. No new abnormality is seen.  CT head 01/01/16 1. The known right basal ganglia and right periventricular white matter infarct shown on 12/13/2015 is surprisingly indistinct on today's exam, with only very vague heterogeneity in this vicinity. No hemorrhagic transformation or acute complicating feature. 2. Old lacunar  infarct of the left anterior thalamus. Chronic microvascular white matter disease. No new significant abnormality observed.  Component     Latest Ref Rng & Units 12/13/2015  Cholesterol     0 - 200 mg/dL 315 (H)  Triglycerides     <150 mg/dL 59  HDL Cholesterol     >40 mg/dL 65  Total CHOL/HDL Ratio     RATIO 4.8  VLDL     0 - 40 mg/dL 12  LDL (calc)     0 - 99 mg/dL 238 (H)  Hemoglobin A1C     4.8 - 5.6 % 5.7 (H)  Mean Plasma Glucose     mg/dL 117  TSH     0.350 - 4.500 uIU/mL 0.911    Assessment: As you may recall, she is a 75 y.o. Caucasian female with PMH of HTN, HLD, hypothyroidism, aortic stenosis, mitral regurgitation, and previous stroke admitted on 12/13/15 for acute moderate large infarct in the right BG as well as a punctate infarct periventricular WM adjacent to right occipital horn. MRA showed severe stenosis of the supraclinoid right ICA, as well as right P1 and left P2. CUS, TTE, DVT screening, TEE all negative. A1C 5.7 and LDL 238. Loop recorder placed. She was discharged to CIR with DAPT and crestor. Pt had previous stroke in 04/2013 with small left BG infarct as well as remote left cerebellar infarct. MRA at that time also showed right ICA supraclinoid segment severe stenosis. Therefore, the current stroke is more likely due to large vessel disease. So far loop recorder no afib. Pt had dramatic recovery and currently no focal deficit. However, she had post stroke fatigue and depression. On zoloft. Was put on praluent for HLD treatment due to statin intolerance. Still has outpt PT/OT. Not RESPECT ESUS candidate due to > 50% corresponding large artery stenosis prior to index stroke.  Plan:  - continue ASA and plavix for one more month and then plavix alone for stroke prevention - continue praluent for HLD and stroke prevention - continue PT/OT and gradually increase work load. Avoid over exertion - Follow up with your primary care physician for stroke risk factor  modification. Recommend maintain blood pressure goal <130/80, diabetes with hemoglobin A1c goal below 7.0% and lipids with LDL cholesterol goal below 70 mg/dL.  - No restriction for driving, but recommend to drive with family members on board for 2-3 times initially. If both parties feel comfortable of your driving, you can drive alone after. However, you are recommended to drive during the day not at night, no long distance and drive in familiar roads. - check BP at home and record - continue loop recorder monitoring - follow up in 4 months.   I spent more than 25 minutes of face to face time with the patient. Greater than 50% of time was spent in counseling and coordination of care. We discussed about post stroke fatigue and depression, treatment and prognosis.    No orders of the defined types were placed in this encounter.   Meds ordered this encounter  Medications  . UNABLE TO FIND  Sig: Inject into the skin. PRAULENT 150MG /ML ONCE EVERY 14 DAYS FOR SUB    Patient Instructions  - continue ASA and plavix for one more month and then plavix alone for stroke prevention - continue praluent for HLD and stroke prevention - continue PT/OT and gradually increase work load. Avoid over exertion - Follow up with your primary care physician for stroke risk factor modification. Recommend maintain blood pressure goal <130/80, diabetes with hemoglobin A1c goal below 7.0% and lipids with LDL cholesterol goal below 70 mg/dL.  - No restriction for driving, but recommend to drive with family members on board for 2-3 times initially. If both parties feel comfortable of your driving, you can drive alone after. However, you are recommended to drive during the day not at night, no long distance and drive in familiar roads. - check BP at home and record - continue loop recorder monitoring - follow up in 4 months.   Rosalin Hawking, MD PhD [facility] [address], [place],  [place] [address] [phone]

## 2016-02-28 ENCOUNTER — Ambulatory Visit: Payer: PPO | Admitting: Physical Therapy

## 2016-02-28 DIAGNOSIS — M6281 Muscle weakness (generalized): Secondary | ICD-10-CM | POA: Diagnosis not present

## 2016-02-28 DIAGNOSIS — R262 Difficulty in walking, not elsewhere classified: Secondary | ICD-10-CM

## 2016-02-28 DIAGNOSIS — G8194 Hemiplegia, unspecified affecting left nondominant side: Secondary | ICD-10-CM

## 2016-02-28 NOTE — Therapy (Signed)
[place], [place], [address] Phone: [phone]   Fax:  [phone]  Physical Therapy Treatment  Patient Details  Name: Stacey Ramirez MRN: [id number] Date of Birth: April 13, 1941 Referring Provider: Shelia Media  Encounter Date: 02/28/2016      PT End of Session - 02/28/16 1027    Visit Number 12   Date for PT Re-Evaluation 03/29/16   PT Start Time 0931   PT Stop Time 1025   PT Time Calculation (min) 54 min   Activity Tolerance Patient tolerated treatment well   Behavior During Therapy [facility] for tasks assessed/performed      Past Medical History:  Diagnosis Date  . Cerebrovascular accident (stroke) ([place])    a. 12/2015 - cryptogenic.  S/P MDT Linq.  . Difficulty walking   . Essential hypertension   . History of dizziness   . Hypercholesterolemia   . Hypothyroidism   . Mitral regurgitation    a. 12/2015 Echo: EF 60-65%, mild focal basal hypertrophy. No rwma, triv AI, mild MR, mildly dil LA w/o thrombus. No RA thrombus. No PFO.  Marland Kitchen Palpitations   . Vitamin D deficiency     Past Surgical History:  Procedure Laterality Date  . CARDIOVASCULAR STRESS TEST  2002   NORMAL  . EP IMPLANTABLE DEVICE N/A 12/17/2015   Procedure: Loop Recorder Insertion;  Surgeon: [name], MD;  Location: [place] [facility];  Service: Cardiovascular;  Laterality: N/A;  . TEE WITHOUT CARDIOVERSION N/A 12/17/2015   Procedure: TRANSESOPHAGEAL ECHOCARDIOGRAM (TEE);  Surgeon: Lelon Perla, MD;  Location: [facility] ENDOSCOPY;  Service: Cardiovascular;  Laterality: N/A;  Pt also needs a LOOP  . TRANSTHORACIC ECHOCARDIOGRAM  2008   SHOWED LEFT VENTRICULAR HYPERTROPHY AND MILD AORTIC STENOSIS    There were no vitals filed for this visit.      Subjective Assessment - 02/28/16 0935    Subjective No pain today but said that doctor visit went well yesterday. Pt reports that she still feels that her walking is not very good.    Limitations Standing;Walking;House hold activities   Patient Stated Goals walk and have no difficulty with ADL's   Currently in Pain? No/denies                         [facility] Adult PT Treatment/Exercise - 02/28/16 0001      Ambulation/Gait   Ambulation Distance (Feet) 200 Feet   Gait Comments CGA, Emphasis on increasing speed, on trip back pt had decreased arm swing on LUE, + 80feet to re-emphasize keeping arm down and not in flexed position     Berg Balance Test   Standing Unsupported --  x30secs SBA no LOB   Standing Unsupported with Eyes Closed --  x30 secs SBA no LOB   Standing Ubsupported with Feet Together --  x30secs SBA no LOB   Standing Unsupported, One Foot in Front --  SBA R/L LOB x1 x30 secs     High Level Balance   High Level Balance Activities Negotiating over obstacles  with 3lb ankle weights x3     Lumbar Exercises: Seated   Sit to Stand 10 reps  On blue airex   Other Seated Lumbar Exercises Overhead press ups with yellow weighted ball 2x10; Trunk rotations with weighted yellow ball     Knee/Hip Exercises: Aerobic   Nustep L 5 6 min     Knee/Hip Exercises: Machines for Strengthening  Cybex Knee Extension 10# 2x15   Cybex Knee Flexion 20# 2x15     Knee/Hip Exercises: Standing   Forward Step Up --  Toe taps to    Other Standing Knee Exercises Standing reaching out of BOS to SPTA hand x5, against wall reaching to target with BUE's x10   Other Standing Knee Exercises Toe taps to 6 in step 3x10                  PT Short Term Goals - 02/06/16 1652      PT SHORT TERM GOAL #1   Title independent with her home HEP from home health   Status Achieved           PT Long Term Goals - 02/13/16 1541      PT LONG TERM GOAL #1   Title decrease TUG time to 20 seconds   Status Achieved     PT LONG TERM GOAL #2   Title increase Berg balance score to 46/56   Status On-going     PT LONG TERM GOAL #3   Title increase LE strength to  4/5   Status On-going     PT LONG TERM GOAL #4   Title walk 700 feet without device, without rest   Status On-going               Plan - 02/28/16 1028    Clinical Impression Statement Pt had difficulty with foot clearance x1 in obstacle course with 3lb weights on ankles. Pt required verbal cues to increase step height for toe taps and to not drag L foot off step. Pt did not require cues for sit to stand exercise today, depicted improved ability to avoid posterior leaning. Pt required verbal cues during gait training to bring LUE out of a flexed position.    Rehab Potential Good   PT Frequency 2x / week   PT Duration 8 weeks   PT Treatment/Interventions ADLs/Self Care Home Management;Gait training;Stair training;Functional mobility training;Therapeutic activities;Therapeutic exercise;Manual techniques;Patient/family education;Neuromuscular re-education;Balance training   PT Next Visit Plan Continue to work on improving balance activities that require changing BOS with EO/EC. Work on gait trainging and try to decrease need for verbal cues regarding UE's. Continue with toe taps with emphasis on foot clearance going up and down as well.       Patient will benefit from skilled therapeutic intervention in order to improve the following deficits and impairments:  Abnormal gait, Decreased activity tolerance, Decreased balance, Decreased mobility, Decreased range of motion, Difficulty walking, Decreased strength  Visit Diagnosis: Difficulty in walking, not elsewhere classified  Left hemiparesis (HCC)  Muscle weakness (generalized)     Problem List Patient Active Problem List   Diagnosis Date Noted  . Intracranial carotid stenosis 02/27/2016  . Essential hypertension   . Cerebrovascular accident (CVA) due to stenosis of right carotid artery ([place])   . Left-sided weakness 12/28/2015  . Accelerated hypertension 12/28/2015  . Headache 12/28/2015  . Adjustment reaction with anxiety  12/20/2015  . Stroke due to embolism of right middle cerebral artery ([place]) 12/17/2015  . Left hemiparesis ([place])   . Cerebrovascular accident (CVA) due to occlusion of cerebral artery ([place])   . History of CVA with residual deficit   . Prediabetes   . Right Basal ganglia infarction ([place]) 12/14/2015  . Acute CVA (cerebrovascular accident) ([place]) 12/14/2015  . SVT (supraventricular tachycardia) ([place]) 12/14/2015  . Stroke ([place]) 12/13/2015  . Stroke-like symptom 12/13/2015  . History of stroke   .  Heart palpitations 09/06/2012  . Hypertension   . HLD (hyperlipidemia)   . Hypothyroidism   . Vitamin D deficiency   . Palpitations   . History of dizziness   . LVH (left ventricular hypertrophy)   . Aortic stenosis   . Mitral regurgitation   . SOB (shortness of breath)   . Difficulty walking     Dallie Piles, [place] 02/28/2016, 10:35 AM  [place], [place], [address] Phone: [phone]   Fax:  [phone]  Name: Stacey Ramirez MRN: [id number] Date of Birth: September 10, 1940

## 2016-03-03 ENCOUNTER — Ambulatory Visit: Payer: PPO | Attending: Internal Medicine | Admitting: Physical Therapy

## 2016-03-03 DIAGNOSIS — R262 Difficulty in walking, not elsewhere classified: Secondary | ICD-10-CM | POA: Insufficient documentation

## 2016-03-03 DIAGNOSIS — G8194 Hemiplegia, unspecified affecting left nondominant side: Secondary | ICD-10-CM | POA: Insufficient documentation

## 2016-03-03 DIAGNOSIS — M6281 Muscle weakness (generalized): Secondary | ICD-10-CM | POA: Diagnosis not present

## 2016-03-03 NOTE — Therapy (Signed)
[place], [place], [address] Phone: [phone]   Fax:  [phone]  Physical Therapy Treatment  Patient Details  Name: Stacey Ramirez MRN: [id number] Date of Birth: 04-21-1941 Referring Provider: Shelia Media  Encounter Date: 03/03/2016      PT End of Session - 03/03/16 1414    Visit Number 13   Date for PT Re-Evaluation 03/29/16   PT Start Time 1315   PT Stop Time 1400   PT Time Calculation (min) 45 min   Activity Tolerance Patient tolerated treatment well   Behavior During Therapy [facility] for tasks assessed/performed      Past Medical History:  Diagnosis Date  . Cerebrovascular accident (stroke) ([place])    a. 12/2015 - cryptogenic.  S/P MDT Linq.  . Difficulty walking   . Essential hypertension   . History of dizziness   . Hypercholesterolemia   . Hypothyroidism   . Mitral regurgitation    a. 12/2015 Echo: EF 60-65%, mild focal basal hypertrophy. No rwma, triv AI, mild MR, mildly dil LA w/o thrombus. No RA thrombus. No PFO.  Marland Kitchen Palpitations   . Vitamin D deficiency     Past Surgical History:  Procedure Laterality Date  . CARDIOVASCULAR STRESS TEST  2002   NORMAL  . EP IMPLANTABLE DEVICE N/A 12/17/2015   Procedure: Loop Recorder Insertion;  Surgeon: [name], MD;  Location: [place] CV LAB;  Service: Cardiovascular;  Laterality: N/A;  . TEE WITHOUT CARDIOVERSION N/A 12/17/2015   Procedure: TRANSESOPHAGEAL ECHOCARDIOGRAM (TEE);  Surgeon: Lelon Perla, MD;  [facility] ENDOSCOPY;  Service: Cardiovascular;  Laterality: N/A;  Pt also needs a LOOP  . TRANSTHORACIC ECHOCARDIOGRAM  2008   SHOWED LEFT VENTRICULAR HYPERTROPHY AND MILD AORTIC STENOSIS    There were no vitals filed for this visit.      Subjective Assessment - 03/03/16 1317    Subjective A little soreness on the back of her legs. Pt reports going to [place] went well.    Limitations Standing;Walking;House hold activities   Patient Stated Goals walk and have no difficulty with ADL's                         [facility] Adult PT Treatment/Exercise - 03/03/16 0001      Ambulation/Gait   Ambulation Distance (Feet) 400 Feet   Gait Comments CGA assist with emphasis on increasing arm swing and speed     High Level Balance   High Level Balance Activities Marching forwards  200 feet   High Level Balance Comments trampoline bounce, squat,split step,marching- HHA required     Knee/Hip Exercises: Aerobic   Nustep L 5 6 min   Other Aerobic Arm bike 2 fwd 2 back     Knee/Hip Exercises: Machines for Strengthening   Cybex Knee Extension 2x10 10#   Cybex Knee Flexion 2x10 20#     Knee/Hip Exercises: Standing   Lateral Step Up 2 sets;10 reps;Both;Step Height: 4"   Forward Step Up Both;2 sets;10 reps;Step Height: 4"   Other Standing Knee Exercises Toe taps to 6 in step 3x10     Knee/Hip Exercises: Seated   Sit to Sand 10 reps;without UE support                  PT Short Term Goals - 02/06/16 1652      PT SHORT TERM GOAL #1   Title independent with her home HEP from  home health   Status Achieved           PT Long Term Goals - 02/13/16 1541      PT LONG TERM GOAL #1   Title decrease TUG time to 20 seconds   Status Achieved     PT LONG TERM GOAL #2   Title increase Berg balance score to 46/56   Status On-going     PT LONG TERM GOAL #3   Title increase LE strength to 4/5   Status On-going     PT LONG TERM GOAL #4   Title walk 700 feet without device, without rest   Status On-going               Plan - 03/03/16 1415    Clinical Impression Statement Pt had decreased foot clearance for toe taps and step ups. Pt required verbal cueing to not flex arm during gait training. Pt required verbal cues throughout ther ex for proper technique.    Rehab Potential Good   PT Duration 8 weeks   PT Treatment/Interventions ADLs/Self Care Home Management;Gait training;Stair  training;Functional mobility training;Therapeutic activities;Therapeutic exercise;Manual techniques;Patient/family education;Neuromuscular re-education;Balance training   PT Next Visit Plan Work on step ups and progressing to 6 in step to foster improved ability to stand on one leg. Work on Personnel officer with emphasis on keeping knees up and keeping left arm out of a flexed position. Implement more EO/EC activities and altering BOS.       Patient will benefit from skilled therapeutic intervention in order to improve the following deficits and impairments:  Abnormal gait, Decreased activity tolerance, Decreased balance, Decreased mobility, Decreased range of motion, Difficulty walking, Decreased strength  Visit Diagnosis: Difficulty in walking, not elsewhere classified  Left hemiparesis (HCC)  Muscle weakness (generalized)     Problem List Patient Active Problem List   Diagnosis Date Noted  . Intracranial carotid stenosis 02/27/2016  . Essential hypertension   . Cerebrovascular accident (CVA) due to stenosis of right carotid artery ([place])   . Left-sided weakness 12/28/2015  . Accelerated hypertension 12/28/2015  . Headache 12/28/2015  . Adjustment reaction with anxiety 12/20/2015  . Stroke due to embolism of right middle cerebral artery ([place]) 12/17/2015  . Left hemiparesis ([place])   . Cerebrovascular accident (CVA) due to occlusion of cerebral artery ([place])   . History of CVA with residual deficit   . Prediabetes   . Right Basal ganglia infarction ([place]) 12/14/2015  . Acute CVA (cerebrovascular accident) ([place]) 12/14/2015  . SVT (supraventricular tachycardia) ([place]) 12/14/2015  . Stroke ([place]) 12/13/2015  . Stroke-like symptom 12/13/2015  . History of stroke   . Heart palpitations 09/06/2012  . Hypertension   . HLD (hyperlipidemia)   . Hypothyroidism   . Vitamin D deficiency   . Palpitations   . History of dizziness   . LVH (left ventricular hypertrophy)   . Aortic stenosis   .  Mitral regurgitation   . SOB (shortness of breath)   . Difficulty walking     Dallie Piles, SPTA 03/03/2016, 2:19 PM  [place], [place], [address] Phone: [phone]   Fax:  [phone]  Name: Stacey Ramirez MRN: [id number] Date of Birth: 12-09-1940

## 2016-03-04 DIAGNOSIS — K5900 Constipation, unspecified: Secondary | ICD-10-CM | POA: Diagnosis not present

## 2016-03-05 ENCOUNTER — Ambulatory Visit: Payer: PPO | Admitting: Physical Therapy

## 2016-03-06 ENCOUNTER — Ambulatory Visit: Payer: PPO | Admitting: Physical Therapy

## 2016-03-09 ENCOUNTER — Ambulatory Visit: Payer: PPO | Admitting: Physical Therapy

## 2016-03-09 DIAGNOSIS — K5900 Constipation, unspecified: Secondary | ICD-10-CM | POA: Diagnosis not present

## 2016-03-10 LAB — CUP PACEART REMOTE DEVICE CHECK: Date Time Interrogation Session: 20170715183843

## 2016-03-11 ENCOUNTER — Ambulatory Visit: Payer: PPO | Admitting: Physical Therapy

## 2016-03-11 ENCOUNTER — Encounter: Payer: Self-pay | Admitting: Physical Therapy

## 2016-03-11 DIAGNOSIS — R262 Difficulty in walking, not elsewhere classified: Secondary | ICD-10-CM

## 2016-03-11 DIAGNOSIS — G8194 Hemiplegia, unspecified affecting left nondominant side: Secondary | ICD-10-CM

## 2016-03-11 DIAGNOSIS — M6281 Muscle weakness (generalized): Secondary | ICD-10-CM

## 2016-03-11 NOTE — Therapy (Signed)
[place], [place], [address] Phone: [phone]   Fax:  [phone]  Physical Therapy Treatment  Patient Details  Name: Stacey Ramirez MRN: [id number] Date of Birth: 26-Jun-1941 Referring Provider: Shelia Media  Encounter Date: 03/11/2016      PT End of Session - 03/11/16 1428    Visit Number 14   Date for PT Re-Evaluation 03/29/16   PT Start Time 1309   PT Stop Time 1357   PT Time Calculation (min) 48 min   Activity Tolerance Patient tolerated treatment well   Behavior During Therapy [facility] for tasks assessed/performed      Past Medical History:  Diagnosis Date  . Cerebrovascular accident (stroke) ([place])    a. 12/2015 - cryptogenic.  S/P MDT Linq.  . Difficulty walking   . Essential hypertension   . History of dizziness   . Hypercholesterolemia   . Hypothyroidism   . Mitral regurgitation    a. 12/2015 Echo: EF 60-65%, mild focal basal hypertrophy. No rwma, triv AI, mild MR, mildly dil LA w/o thrombus. No RA thrombus. No PFO.  Marland Kitchen Palpitations   . Vitamin D deficiency     Past Surgical History:  Procedure Laterality Date  . CARDIOVASCULAR STRESS TEST  2002   NORMAL  . EP IMPLANTABLE DEVICE N/A 12/17/2015   Procedure: Loop Recorder Insertion;  Surgeon: [name], MD;  Location: [place] CV LAB;  Service: Cardiovascular;  Laterality: N/A;  . TEE WITHOUT CARDIOVERSION N/A 12/17/2015   Procedure: TRANSESOPHAGEAL ECHOCARDIOGRAM (TEE);  Surgeon: Lelon Perla, MD;  Location: [facility] ENDOSCOPY;  Service: Cardiovascular;  Laterality: N/A;  Pt also needs a LOOP  . TRANSTHORACIC ECHOCARDIOGRAM  2008   SHOWED LEFT VENTRICULAR HYPERTROPHY AND MILD AORTIC STENOSIS    There were no vitals filed for this visit.      Subjective Assessment - 03/11/16 1311    Subjective Patient reports that she has been having some stomach issues for the past week.  She reports that she feels a little run down and weak after  that.   Currently in Pain? No/denies                         [facility] Adult PT Treatment/Exercise - 03/11/16 0001      Ambulation/Gait   Gait Comments ambulate outside around building 500' close supervision, did some stairs reciprocally     High Level Balance   High Level Balance Activities Side stepping;Backward walking;Direction changes;Tandem walking   High Level Balance Comments side stepping with ball toss, forward walking with ball toss, backward walking, airex standing, head turns, eyes open and closed, also changing BOS, fast walking with HHA, SLS attempting     Knee/Hip Exercises: Aerobic   Nustep L 5 6 min   Other Aerobic Arm bike 2 fwd 2 back     Knee/Hip Exercises: Standing   Other Standing Knee Exercises Toe taps to 6 in step 3x10     Knee/Hip Exercises: Seated   Sit to Sand 10 reps;without UE support                  PT Short Term Goals - 02/06/16 1652      PT SHORT TERM GOAL #1   Title independent with her home HEP from home health   Status Achieved           PT Long Term Goals - 03/11/16 1430  PT LONG TERM GOAL #3   Title increase LE strength to 4/5   Status Partially Met               Plan - 03/11/16 1429    Clinical Impression Statement Patient with good ability to walk around the building, SOB and fatigued with this but did not need to rest.  Difficulty raising left leg up for SLS   PT Next Visit Plan Work on step ups and progressing to 6 in step to foster improved ability to stand on one leg. Work on Personnel officer with emphasis on keeping knees up and keeping left arm out of a flexed position. Implement more EO/EC activities and altering BOS.    Consulted and Agree with Plan of Care Patient      Patient will benefit from skilled therapeutic intervention in order to improve the following deficits and impairments:  Abnormal gait, Decreased activity tolerance, Decreased balance, Decreased mobility, Decreased range  of motion, Difficulty walking, Decreased strength  Visit Diagnosis: Difficulty in walking, not elsewhere classified  Left hemiparesis (HCC)  Muscle weakness (generalized)     Problem List Patient Active Problem List   Diagnosis Date Noted  . Intracranial carotid stenosis 02/27/2016  . Essential hypertension   . Cerebrovascular accident (CVA) due to stenosis of right carotid artery ([place])   . Left-sided weakness 12/28/2015  . Accelerated hypertension 12/28/2015  . Headache 12/28/2015  . Adjustment reaction with anxiety 12/20/2015  . Stroke due to embolism of right middle cerebral artery ([place]) 12/17/2015  . Left hemiparesis ([place])   . Cerebrovascular accident (CVA) due to occlusion of cerebral artery ([place])   . History of CVA with residual deficit   . Prediabetes   . Right Basal ganglia infarction ([place]) 12/14/2015  . Acute CVA (cerebrovascular accident) ([place]) 12/14/2015  . SVT (supraventricular tachycardia) ([place]) 12/14/2015  . Stroke ([place]) 12/13/2015  . Stroke-like symptom 12/13/2015  . History of stroke   . Heart palpitations 09/06/2012  . Hypertension   . HLD (hyperlipidemia)   . Hypothyroidism   . Vitamin D deficiency   . Palpitations   . History of dizziness   . LVH (left ventricular hypertrophy)   . Aortic stenosis   . Mitral regurgitation   . SOB (shortness of breath)   . Difficulty walking     Sumner Boast., PT 03/11/2016, 2:31 PM  [place], [place], [address] Phone: [phone]   Fax:  [phone]  Name: Stacey Ramirez MRN: [id number] Date of Birth: 1941-02-08

## 2016-03-12 ENCOUNTER — Ambulatory Visit: Payer: PPO | Admitting: Physical Therapy

## 2016-03-13 ENCOUNTER — Ambulatory Visit: Payer: PPO | Admitting: Physical Therapy

## 2016-03-13 DIAGNOSIS — R262 Difficulty in walking, not elsewhere classified: Secondary | ICD-10-CM

## 2016-03-13 DIAGNOSIS — G8194 Hemiplegia, unspecified affecting left nondominant side: Secondary | ICD-10-CM

## 2016-03-13 DIAGNOSIS — M6281 Muscle weakness (generalized): Secondary | ICD-10-CM

## 2016-03-13 NOTE — Therapy (Signed)
[place], [place], [address] Phone: [phone]   Fax:  [phone]  Physical Therapy Treatment  Patient Details  Name: Stacey Ramirez MRN: [id number] Date of Birth: August 06, 1940 Referring Provider: Shelia Media  Encounter Date: 03/13/2016      PT End of Session - 03/13/16 1154    Visit Number 15   Date for PT Re-Evaluation 03/29/16   PT Start Time 1114   PT Stop Time 1153   PT Time Calculation (min) 39 min   Activity Tolerance Patient tolerated treatment well   Behavior During Therapy [facility] for tasks assessed/performed      Past Medical History:  Diagnosis Date  . Cerebrovascular accident (stroke) ([place])    a. 12/2015 - cryptogenic.  S/P MDT Linq.  . Difficulty walking   . Essential hypertension   . History of dizziness   . Hypercholesterolemia   . Hypothyroidism   . Mitral regurgitation    a. 12/2015 Echo: EF 60-65%, mild focal basal hypertrophy. No rwma, triv AI, mild MR, mildly dil LA w/o thrombus. No RA thrombus. No PFO.  Marland Kitchen Palpitations   . Vitamin D deficiency     Past Surgical History:  Procedure Laterality Date  . CARDIOVASCULAR STRESS TEST  2002   NORMAL  . EP IMPLANTABLE DEVICE N/A 12/17/2015   Procedure: Loop Recorder Insertion;  Surgeon: [name], MD;  Location: [place] [facility];  Service: Cardiovascular;  Laterality: N/A;  . TEE WITHOUT CARDIOVERSION N/A 12/17/2015   Procedure: TRANSESOPHAGEAL ECHOCARDIOGRAM (TEE);  Surgeon: Lelon Perla, MD;  Location: [facility] ENDOSCOPY;  Service: Cardiovascular;  Laterality: N/A;  Pt also needs a LOOP  . TRANSTHORACIC ECHOCARDIOGRAM  2008   SHOWED LEFT VENTRICULAR HYPERTROPHY AND MILD AORTIC STENOSIS    There were no vitals filed for this visit.      Subjective Assessment - 03/13/16 1108    Subjective Pt reports that her R leg feels a little weak, but feeling better than last week.    Limitations Standing;Walking;House hold activities    Patient Stated Goals walk and have no difficulty with ADL's                         [facility] Adult PT Treatment/Exercise - 03/13/16 0001      Ambulation/Gait   Gait Comments Fast walking in hallway 264f x2, side stepping quickly leading with L foot 2064fx2.     High Level Balance   High Level Balance Activities Backward walking;Direction changes;Turns;Head turns;Tandem walking   High Level Balance Comments Narrow BOS EC x10 secs repeat x3     Knee/Hip Exercises: Aerobic   Nustep L 5 6 min   Other Aerobic Arm bike 2 fwd 2 back     Knee/Hip Exercises: Machines for Strengthening   Cybex Knee Extension 2x10 10#   Cybex Knee Flexion 2x10 20#     Knee/Hip Exercises: Standing   Other Standing Knee Exercises Toe taps to 8 in step 3x10     Knee/Hip Exercises: Seated   Sit to Sand 10 reps;without UE support  With weighted yellow ball                PT Education - 03/13/16 1159    Education provided Yes   Education Details Added EO tandem stance at sink for support and HS curls at sink for support to HEP via demo and talk through teaching method.    Person(s)  Educated Patient;Spouse   Methods Explanation;Demonstration   Comprehension Verbalized understanding;Returned demonstration          PT Short Term Goals - 02/06/16 1652      PT SHORT TERM GOAL #1   Title independent with her home HEP from home health   Status Achieved           PT Long Term Goals - 03/11/16 1430      PT LONG TERM GOAL #3   Title increase LE strength to 4/5   Status Partially Met               Plan - 03/13/16 1155    Clinical Impression Statement Pt had improved arm swing for LUE when entering the clinic and increased stride length bilaterally. Pt required verbal cues to keep up pace with fast walking in hallway, slowed down multiple times and shortened step length. Pt tolerated turns and direction changes with no LOB. Pt tolerated progression to toe taps to 8 in  step, had slight decrease in step height for LLE towards end of reps.    Rehab Potential Good   PT Frequency 2x / week   PT Duration 8 weeks   PT Treatment/Interventions ADLs/Self Care Home Management;Gait training;Stair training;Functional mobility training;Therapeutic activities;Therapeutic exercise;Manual techniques;Patient/family education;Neuromuscular re-education;Balance training   PT Next Visit Plan Work on toe taps to 8 in step again to foster improved ability to stand on one leg. Work on Personnel officer with emphasis on increasing speed and step length.       Patient will benefit from skilled therapeutic intervention in order to improve the following deficits and impairments:  Abnormal gait, Decreased activity tolerance, Decreased balance, Decreased mobility, Decreased range of motion, Difficulty walking, Decreased strength  Visit Diagnosis: Difficulty in walking, not elsewhere classified  Left hemiparesis (HCC)  Muscle weakness (generalized)     Problem List Patient Active Problem List   Diagnosis Date Noted  . Intracranial carotid stenosis 02/27/2016  . Essential hypertension   . Cerebrovascular accident (CVA) due to stenosis of right carotid artery ([place])   . Left-sided weakness 12/28/2015  . Accelerated hypertension 12/28/2015  . Headache 12/28/2015  . Adjustment reaction with anxiety 12/20/2015  . Stroke due to embolism of right middle cerebral artery ([place]) 12/17/2015  . Left hemiparesis ([place])   . Cerebrovascular accident (CVA) due to occlusion of cerebral artery ([place])   . History of CVA with residual deficit   . Prediabetes   . Right Basal ganglia infarction ([place]) 12/14/2015  . Acute CVA (cerebrovascular accident) ([place]) 12/14/2015  . SVT (supraventricular tachycardia) ([place]) 12/14/2015  . Stroke ([place]) 12/13/2015  . Stroke-like symptom 12/13/2015  . History of stroke   . Heart palpitations 09/06/2012  . Hypertension   . HLD (hyperlipidemia)   . Hypothyroidism    . Vitamin D deficiency   . Palpitations   . History of dizziness   . LVH (left ventricular hypertrophy)   . Aortic stenosis   . Mitral regurgitation   . SOB (shortness of breath)   . Difficulty walking     Dallie Piles, SPTA 03/13/2016, 12:00 PM  [place], [place], [address] Phone: [phone]   Fax:  [phone]  Name: Stacey Ramirez MRN: [id number] Date of Birth: [birth year]

## 2016-03-16 ENCOUNTER — Ambulatory Visit (INDEPENDENT_AMBULATORY_CARE_PROVIDER_SITE_OTHER): Payer: PPO | Admitting: *Deleted

## 2016-03-16 DIAGNOSIS — I639 Cerebral infarction, unspecified: Secondary | ICD-10-CM

## 2016-03-17 ENCOUNTER — Ambulatory Visit: Payer: PPO | Admitting: Physical Therapy

## 2016-03-17 DIAGNOSIS — R262 Difficulty in walking, not elsewhere classified: Secondary | ICD-10-CM

## 2016-03-17 DIAGNOSIS — G8194 Hemiplegia, unspecified affecting left nondominant side: Secondary | ICD-10-CM

## 2016-03-17 DIAGNOSIS — M6281 Muscle weakness (generalized): Secondary | ICD-10-CM

## 2016-03-17 NOTE — Progress Notes (Signed)
Carelink Summary Report / Loop Recorder 

## 2016-03-17 NOTE — Therapy (Signed)
[place], [place], [address] Phone: [phone]   Fax:  [phone]  Physical Therapy Treatment  Patient Details  Name: ROBBI SPELLS MRN: [id number] Date of Birth: [birth year] Referring Provider: Shelia Media  Encounter Date: 03/17/2016      PT End of Session - 03/17/16 1118    Visit Number 16   Date for PT Re-Evaluation 03/29/16   PT Start Time 1975   PT Stop Time 1105   PT Time Calculation (min) 50 min   Activity Tolerance Patient tolerated treatment well   Behavior During Therapy [facility] for tasks assessed/performed      Past Medical History:  Diagnosis Date  . Cerebrovascular accident (stroke) ([place])    a. 12/2015 - cryptogenic.  S/P MDT Linq.  . Difficulty walking   . Essential hypertension   . History of dizziness   . Hypercholesterolemia   . Hypothyroidism   . Mitral regurgitation    a. 12/2015 Echo: EF 60-65%, mild focal basal hypertrophy. No rwma, triv AI, mild MR, mildly dil LA w/o thrombus. No RA thrombus. No PFO.  Marland Kitchen Palpitations   . Vitamin D deficiency     Past Surgical History:  Procedure Laterality Date  . CARDIOVASCULAR STRESS TEST  2002   NORMAL  . EP IMPLANTABLE DEVICE N/A 12/17/2015   Procedure: Loop Recorder Insertion;  Surgeon: [name], MD;  Location: [place] CV [facility];  Service: Cardiovascular;  Laterality: N/A;  . TEE WITHOUT CARDIOVERSION N/A 12/17/2015   Procedure: TRANSESOPHAGEAL ECHOCARDIOGRAM (TEE);  Surgeon: Lelon Perla, MD;  Location[facility] ENDOSCOPY;  Service: Cardiovascular;  Laterality: N/A;  Pt also needs a LOOP  . TRANSTHORACIC ECHOCARDIOGRAM  2008   SHOWED LEFT VENTRICULAR HYPERTROPHY AND MILD AORTIC STENOSIS    There were no vitals filed for this visit.      Subjective Assessment - 03/17/16 1024    Subjective Pt reports that she had a little pain a few days ago in the back of her legs, didn't do as much at home.    Limitations  Standing;Walking;House hold activities   Patient Stated Goals walk and have no difficulty with ADL's   Currently in Pain? No/denies                         [facility] Adult PT Treatment/Exercise - 03/17/16 0001      Ambulation/Gait   Gait Comments Fast walking in hallway 236f x2, side stepping quickly leading with L foot 2043fx2., ascending and descending stairs, emphasis on upright posture and step over step pattern x24 steps 6 in     High Level Balance   High Level Balance Activities Backward walking   High Level Balance Comments Backward walking 200 ft in hallway     Knee/Hip Exercises: Aerobic   Stationary Bike 6 min     Knee/Hip Exercises: Machines for Strengthening   Cybex Leg Press 20# BLE's, no weight x10 ea leg     Knee/Hip Exercises: Standing   SLS R/L 10 secs x2 using wall for support, LOB x1 to L   Other Standing Knee Exercises Marches with 3# weights 3x10   Other Standing Knee Exercises Toe taps to 8 in step 3x10     Knee/Hip Exercises: Seated   Other Seated Knee/Hip Exercises OHP with yellow weighted ball, 2x10   Sit to Sand 10 reps;with UE support  With feet on airex  PT Short Term Goals - 02/06/16 1652      PT SHORT TERM GOAL #1   Title independent with her home HEP from home health   Status Achieved           PT Long Term Goals - 03/17/16 1122      PT LONG TERM GOAL #2   Title increase Berg balance score to 46/56   Time 8   Period Weeks   Status On-going     PT LONG TERM GOAL #3   Title increase LE strength to 4/5   Time 8   Period Weeks   Status Partially Met     PT LONG TERM GOAL #4   Title walk 700 feet without device, without rest   Time 8   Period Weeks   Status On-going               Plan - 03/17/16 1118    Clinical Impression Statement Pt had difficulty with toe taps to 8 in step today possibly due to weakness of hip flexors. Pt required verbal cues during marches with ankle  weights to bring knees up higher. Pt had improved technique with backward walking, utilized a toe/heel pattern instead of small shuffling steps.    Rehab Potential Good   PT Frequency 2x / week   PT Duration 8 weeks   PT Treatment/Interventions ADLs/Self Care Home Management;Gait training;Stair training;Functional mobility training;Therapeutic activities;Therapeutic exercise;Manual techniques;Patient/family education;Neuromuscular re-education;Balance training   PT Next Visit Plan Work on strengthening hips to foster improved ability to clear larger steps and stand on one leg without UE support. Work on Personnel officer without UE support implementing fast walking. MMT for LE strength.       Patient will benefit from skilled therapeutic intervention in order to improve the following deficits and impairments:  Abnormal gait, Decreased activity tolerance, Decreased balance, Decreased mobility, Decreased range of motion, Difficulty walking, Decreased strength  Visit Diagnosis: Difficulty in walking, not elsewhere classified  Left hemiparesis (HCC)  Muscle weakness (generalized)     Problem List Patient Active Problem List   Diagnosis Date Noted  . Intracranial carotid stenosis 02/27/2016  . Essential hypertension   . Cerebrovascular accident (CVA) due to stenosis of right carotid artery ([place])   . Left-sided weakness 12/28/2015  . Accelerated hypertension 12/28/2015  . Headache 12/28/2015  . Adjustment reaction with anxiety 12/20/2015  . Stroke due to embolism of right middle cerebral artery ([place]) 12/17/2015  . Left hemiparesis ([place])   . Cerebrovascular accident (CVA) due to occlusion of cerebral artery ([place])   . History of CVA with residual deficit   . Prediabetes   . Right Basal ganglia infarction ([place]) 12/14/2015  . Acute CVA (cerebrovascular accident) ([place]) 12/14/2015  . SVT (supraventricular tachycardia) ([place]) 12/14/2015  . Stroke ([place]) 12/13/2015  . Stroke-like symptom  12/13/2015  . History of stroke   . Heart palpitations 09/06/2012  . Hypertension   . HLD (hyperlipidemia)   . Hypothyroidism   . Vitamin D deficiency   . Palpitations   . History of dizziness   . LVH (left ventricular hypertrophy)   . Aortic stenosis   . Mitral regurgitation   . SOB (shortness of breath)   . Difficulty walking     Dallie Piles, [place] 03/17/2016, 11:24 AM  [place], [place], [address] Phone: [phone]   Fax:  [phone]  Name: CLARIECE ROESLER MRN: [id number] Date of  Birth: January 11, 1941

## 2016-03-18 ENCOUNTER — Ambulatory Visit: Payer: PPO | Attending: Internal Medicine | Admitting: Physical Therapy

## 2016-03-18 ENCOUNTER — Other Ambulatory Visit: Payer: Self-pay | Admitting: *Deleted

## 2016-03-18 DIAGNOSIS — M6281 Muscle weakness (generalized): Secondary | ICD-10-CM | POA: Insufficient documentation

## 2016-03-18 DIAGNOSIS — R262 Difficulty in walking, not elsewhere classified: Secondary | ICD-10-CM | POA: Diagnosis not present

## 2016-03-18 DIAGNOSIS — G8194 Hemiplegia, unspecified affecting left nondominant side: Secondary | ICD-10-CM | POA: Insufficient documentation

## 2016-03-18 NOTE — Therapy (Signed)
[place], [place], [address] Phone: [phone]   Fax:  [phone]  Physical Therapy Treatment  Patient Details  Name: Stacey Ramirez MRN: [id number] Date of Birth: 1941/07/27 Referring Provider: Shelia Media  Encounter Date: 03/18/2016      PT End of Session - 03/18/16 1154    Visit Number 17   Date for PT Re-Evaluation 03/29/16   PT Start Time 1100   PT Stop Time 1148   PT Time Calculation (min) 48 min   Activity Tolerance Patient tolerated treatment well   Behavior During Therapy [facility] for tasks assessed/performed      Past Medical History:  Diagnosis Date  . Cerebrovascular accident (stroke) ([place])    a. 12/2015 - cryptogenic.  S/P MDT Linq.  . Difficulty walking   . Essential hypertension   . History of dizziness   . Hypercholesterolemia   . Hypothyroidism   . Mitral regurgitation    a. 12/2015 Echo: EF 60-65%, mild focal basal hypertrophy. No rwma, triv AI, mild MR, mildly dil LA w/o thrombus. No RA thrombus. No PFO.  Marland Kitchen Palpitations   . Vitamin D deficiency     Past Surgical History:  Procedure Laterality Date  . CARDIOVASCULAR STRESS TEST  2002   NORMAL  . EP IMPLANTABLE DEVICE N/A 12/17/2015   Procedure: Loop Recorder Insertion;  Surgeon: [name], MD;  Location: [place] CV [facility];  Service: Cardiovascular;  Laterality: N/A;  . TEE WITHOUT CARDIOVERSION N/A 12/17/2015   Procedure: TRANSESOPHAGEAL ECHOCARDIOGRAM (TEE);  Surgeon: Lelon Perla, MD;  Location: [facility] ENDOSCOPY;  Service: Cardiovascular;  Laterality: N/A;  Pt also needs a LOOP  . TRANSTHORACIC ECHOCARDIOGRAM  2008   SHOWED LEFT VENTRICULAR HYPERTROPHY AND MILD AORTIC STENOSIS    There were no vitals filed for this visit.      Subjective Assessment - 03/18/16 1102    Subjective Legs feeling a little stiff this morning. Ankles feel a litle weak.   Limitations Standing;Walking;House hold activities   Patient Stated  Goals walk and have no difficulty with ADL's   Currently in Pain? No/denies            [facility] PT Assessment - 03/18/16 0001      Strength   Overall Strength Comments LE strength 4+/5                     [facility] Adult PT Treatment/Exercise - 03/18/16 0001      Ambulation/Gait   Gait Comments Walking 200 ft through hall, straight outside 500 ft no rest no HHA     Berg Balance Test   Sit to Stand Able to stand without using hands and stabilize independently   Standing Unsupported Able to stand safely 2 minutes   Sitting with Back Unsupported but Feet Supported on Floor or Stool Able to sit safely and securely 2 minutes   Stand to Sit Sits safely with minimal use of hands   Transfers Able to transfer safely, minor use of hands   Standing Unsupported with Eyes Closed Able to stand 10 seconds safely   Standing Ubsupported with Feet Together Able to place feet together independently and stand 1 minute safely   From Standing, Reach Forward with Outstretched Arm Can reach forward >12 cm safely (5")   From Standing Position, Pick up Object from Floor Able to pick up shoe safely and easily   From Standing Position, Turn to Look Behind Over each  Shoulder Looks behind from both sides and weight shifts well   Standing Unsupported, One Foot in [place] to plae foot ahead of the other independently and hold 30 seconds   Standing on One Leg Able to lift leg independently and hold equal to or more than 3 seconds     Knee/Hip Exercises: Aerobic   Nustep L 5 6 min                  PT Short Term Goals - 02/06/16 1652      PT SHORT TERM GOAL #1   Title independent with her home HEP from home health   Status Achieved           PT Long Term Goals - 03/18/16 1148      PT LONG TERM GOAL #2   Title increase Berg balance score to 46/56   Baseline Score currently 44/56   Time 8   Period Weeks   Status Partially Met     PT LONG TERM GOAL #3   Title increase LE  strength to 4/5   Time 8   Period Weeks   Status Achieved     PT LONG TERM GOAL #4   Title walk 700 feet without device, without rest   Time 8   Period Weeks   Status Achieved               Plan - 03/18/16 1155    Clinical Impression Statement Pt had improved arm swing during gait. Did not require verbal cues to increase step length or bring arm out of flexed position. Pt had improved ability to stand on RLE, able to stand 10 secs, but only able to stand 3 secs on LLE only.    Rehab Potential Good   PT Frequency 2x / week   PT Duration 8 weeks   PT Treatment/Interventions ADLs/Self Care Home Management;Gait training;Stair training;Functional mobility training;Therapeutic activities;Therapeutic exercise;Manual techniques;Patient/family education;Neuromuscular re-education;Balance training   PT Next Visit Plan Spoke with lead PT, Will put pt on 2 week hold, pt will call back within two weeks if she notices a decline in function. Will assume D/C if pt does not call within 2 weeks.       Patient will benefit from skilled therapeutic intervention in order to improve the following deficits and impairments:  Abnormal gait, Decreased activity tolerance, Decreased balance, Decreased mobility, Decreased range of motion, Difficulty walking, Decreased strength  Visit Diagnosis: Difficulty in walking, not elsewhere classified  Left hemiparesis (HCC)  Muscle weakness (generalized)     Problem List Patient Active Problem List   Diagnosis Date Noted  . Intracranial carotid stenosis 02/27/2016  . Essential hypertension   . Cerebrovascular accident (CVA) due to stenosis of right carotid artery ([place])   . Left-sided weakness 12/28/2015  . Accelerated hypertension 12/28/2015  . Headache 12/28/2015  . Adjustment reaction with anxiety 12/20/2015  . Stroke due to embolism of right middle cerebral artery ([place]) 12/17/2015  . Left hemiparesis ([place])   . Cerebrovascular accident (CVA) due to  occlusion of cerebral artery ([place])   . History of CVA with residual deficit   . Prediabetes   . Right Basal ganglia infarction ([place]) 12/14/2015  . Acute CVA (cerebrovascular accident) ([place]) 12/14/2015  . SVT (supraventricular tachycardia) ([place]) 12/14/2015  . Stroke ([place]) 12/13/2015  . Stroke-like symptom 12/13/2015  . History of stroke   . Heart palpitations 09/06/2012  . Hypertension   . HLD (hyperlipidemia)   . Hypothyroidism   .  Vitamin D deficiency   . Palpitations   . History of dizziness   . LVH (left ventricular hypertrophy)   . Aortic stenosis   . Mitral regurgitation   . SOB (shortness of breath)   . Difficulty walking     Dallie Piles, Dayle Points 03/18/2016, 11:57 AM  [place], [place], [address] Phone: [phone]   Fax:  [phone]  Name: Stacey Ramirez MRN: [id number] Date of Birth: 01-Apr-1941

## 2016-03-18 NOTE — Patient Outreach (Signed)
[facility]) Care Management   03/18/2016  Stacey Ramirez 01/24/1941 [id number]  Stacey Ramirez is an 75 y.o. female  Subjective:  HTN: Pt reports she continues to improve daily and her BP have been "good". States the highest and low BP however only on those days.  Caregiver states her BP have been within her normal with no acute issues. Pt/caregiver continues to take pt's BP morning and night with no major issues or complaints. APPOINTMENTS: Pt reports attending all medical appointments with no delays and pt has sufficient transportation.  SAFETY: Pt reports she has completed out patient therapy today (one week early). Pt will follow up with the PT provider Dr. Naaman Plummer in September. Pt states she has the assisted devices that she use with PT however no longer using any devices and she has built her strength with increase activites. No falls or injuries reported.  Objective:   Review of Systems  Constitutional: Negative.   HENT: Negative.   Eyes: Negative.   Respiratory: Negative.   Cardiovascular: Negative.   Gastrointestinal: Negative.   Genitourinary: Negative.   Musculoskeletal: Negative.   Skin: Negative.   Neurological: Negative.   Endo/Heme/Allergies: Negative.   Psychiatric/Behavioral: Negative.     Physical Exam  Constitutional: She is oriented to person, place, and time. She appears well-developed and well-nourished.  HENT:  Right Ear: External ear normal.  Left Ear: External ear normal.  Eyes: EOM are normal.  Neck: Normal range of motion.  Cardiovascular: Normal heart sounds.   Respiratory: Effort normal and breath sounds normal.  GI: Soft. Bowel sounds are normal.  Musculoskeletal: Normal range of motion.  Neurological: She is alert and oriented to person, place, and time.  Skin: Skin is warm and dry.  Psychiatric: She has a normal mood and affect. Her behavior is normal. Judgment and thought content normal.    Encounter Medications:   Outpatient  Encounter Prescriptions as of 03/18/2016  Medication Sig Note  . ALPRAZolam (XANAX) 0.25 MG tablet Take 1 tablet (0.25 mg total) by mouth at bedtime. (Patient taking differently: Take 0.25 mg by mouth at bedtime as needed. Pt takes as needed)   . amLODipine (NORVASC) 10 MG tablet Take 1 tablet (10 mg total) by mouth 2 (two) times daily. Reported on 12/27/2015 (Patient taking differently: Take 10 mg by mouth daily. Reported on 12/27/2015) 01/24/2016: Per spouse, patient is taking 1 tablet 10 mg once daily  . aspirin EC 325 MG EC tablet Take 1 tablet (325 mg total) by mouth daily.   . B Complex-C-Folic Acid (STRESS FORMULA) TABS Take 1 tablet by mouth daily. Reported on 01/24/2016   . carboxymethylcellulose (REFRESH PLUS) 0.5 % SOLN Place 1 drop into both eyes 3 (three) times daily as needed.   . clopidogrel (PLAVIX) 75 MG tablet Take 1 tablet (75 mg total) by mouth daily.   Marland Kitchen docusate sodium (COLACE) 100 MG capsule Take 100 mg by mouth daily.   Marland Kitchen estradiol (ESTRACE) 0.1 MG/GM vaginal cream Place 2 g vaginally as needed (dryness).    . irbesartan (AVAPRO) 300 MG tablet Take 1 tablet (300 mg total) by mouth daily.   Marland Kitchen levothyroxine (SYNTHROID, LEVOTHROID) 25 MCG tablet Take 25 mcg by mouth daily before breakfast.    . LOTEMAX 0.5 % ophthalmic suspension Place 1 drop into both eyes 4 (four) times daily as needed. For dry itchy eyes   . Multiple Vitamin (MULTIVITAMIN WITH MINERALS) TABS tablet Take 1 tablet by mouth daily.   . polyethylene  glycol (MIRALAX / GLYCOLAX) packet Take 17 g by mouth daily. (Patient taking differently: Take 17 g by mouth daily as needed for moderate constipation. )   . sertraline (ZOLOFT) 50 MG tablet Take 50 mg by mouth daily.    Marland Kitchen UNABLE TO FIND Inject into the skin. PRAULENT 150MG/ML ONCE EVERY 14 DAYS FOR SUB    No facility-administered encounter medications on file as of 03/18/2016.     Functional Status:   In your present state of health, do you have any difficulty  performing the following activities: 01/30/2016 01/24/2016  Hearing? N N  Vision? N N  Difficulty concentrating or making decisions? Stacey Ramirez  Walking or climbing stairs? N Y  Dressing or bathing? N N  Doing errands, shopping? Stacey Ramirez  Preparing Food and eating ? N -  Using the Toilet? N -  In the past six months, have you accidently leaked urine? N -  Do you have problems with loss of bowel control? N -  Managing your Medications? N -  Managing your Finances? N -  Housekeeping or managing your Housekeeping? Y -  Some recent data might be hidden    Fall/Depression Screening:    PHQ 2/9 Scores 01/30/2016 01/24/2016 01/03/2016  PHQ - 2 Score 0 0 5  PHQ- 9 Score - - 15  BP 120/80 (BP Location: Left Arm, Patient Position: Sitting, Cuff Size: Normal)   Pulse 90   Resp 20   Ht 1.575 m (5' 2" )   Wt 113 lb (51.3 kg)   SpO2 98%   BMI 20.67 kg/m   Assessment:   Ongoing case management related to HTN Follow on adherence related to medical appointments Safety related completed out patient rehabilitation  Plan:  Will verify pt has not experienced any signs or symptoms related to HTN. Pt able to verify no signs or symptoms have occurred. Pt and caregiver reports readings of pt's highest and lowest BP as documented with no signs.  Verified pt will follow up with the CAD concerning this matter in September. Will verify pt has comleted out patient therapy and will follow up with the on 9/5 (Dr. Naaman Plummer) .  Verify pt feels safe ambulating without use of her assisted devices and states she is able to ambulate further with more stability then last month. Verified pt completed PT out patient and informed that she can resume services if needed in the future.  Plan of care and goals discussed today as pt has successful met some goals. Will re-evaluate next month on her long term goals as pt continues to manage her care with much improvement. Will continue to encouraged pt on managing her care (HTN).  Raina Mina, RN Care Management Coordinator [place] [facility] [phone]

## 2016-03-19 ENCOUNTER — Ambulatory Visit: Payer: PPO | Admitting: Physical Therapy

## 2016-03-20 ENCOUNTER — Ambulatory Visit: Payer: PPO | Admitting: Physical Therapy

## 2016-03-24 ENCOUNTER — Emergency Department (HOSPITAL_COMMUNITY): Payer: PPO

## 2016-03-24 ENCOUNTER — Encounter: Payer: PPO | Admitting: Physical Therapy

## 2016-03-24 ENCOUNTER — Encounter (HOSPITAL_COMMUNITY): Payer: Self-pay | Admitting: Emergency Medicine

## 2016-03-24 ENCOUNTER — Emergency Department (HOSPITAL_COMMUNITY)
Admission: EM | Admit: 2016-03-24 | Discharge: 2016-03-24 | Disposition: A | Discharge status: Home / Self Care | Payer: PPO | Attending: Emergency Medicine | Admitting: Emergency Medicine

## 2016-03-24 DIAGNOSIS — Z7902 Long term (current) use of antithrombotics/antiplatelets: Secondary | ICD-10-CM | POA: Insufficient documentation

## 2016-03-24 DIAGNOSIS — S79912A Unspecified injury of left hip, initial encounter: Secondary | ICD-10-CM | POA: Diagnosis not present

## 2016-03-24 DIAGNOSIS — W109XXA Fall (on) (from) unspecified stairs and steps, initial encounter: Secondary | ICD-10-CM | POA: Insufficient documentation

## 2016-03-24 DIAGNOSIS — Y999 Unspecified external cause status: Secondary | ICD-10-CM | POA: Diagnosis not present

## 2016-03-24 DIAGNOSIS — E039 Hypothyroidism, unspecified: Secondary | ICD-10-CM | POA: Insufficient documentation

## 2016-03-24 DIAGNOSIS — Y929 Unspecified place or not applicable: Secondary | ICD-10-CM | POA: Diagnosis not present

## 2016-03-24 DIAGNOSIS — I10 Essential (primary) hypertension: Secondary | ICD-10-CM | POA: Diagnosis not present

## 2016-03-24 DIAGNOSIS — Z7982 Long term (current) use of aspirin: Secondary | ICD-10-CM | POA: Diagnosis not present

## 2016-03-24 DIAGNOSIS — Z8673 Personal history of transient ischemic attack (TIA), and cerebral infarction without residual deficits: Secondary | ICD-10-CM | POA: Diagnosis not present

## 2016-03-24 DIAGNOSIS — S7002XA Contusion of left hip, initial encounter: Secondary | ICD-10-CM | POA: Diagnosis not present

## 2016-03-24 DIAGNOSIS — Y939 Activity, unspecified: Secondary | ICD-10-CM | POA: Diagnosis not present

## 2016-03-24 DIAGNOSIS — M25552 Pain in left hip: Secondary | ICD-10-CM | POA: Diagnosis not present

## 2016-03-24 DIAGNOSIS — W19XXXA Unspecified fall, initial encounter: Secondary | ICD-10-CM

## 2016-03-24 NOTE — ED Triage Notes (Signed)
Pt sts fall going up stairs 3 hours ago; pt with left hip pain and large contusion noted; pt takes plavix and had negative xray at her PCP office

## 2016-03-24 NOTE — ED Provider Notes (Addendum)
[facility] DEPT Provider Note   CSN: [id number] Arrival date & time: 03/24/16  1652     History   Chief Complaint Chief Complaint  Patient presents with  . Fall    HPI Stacey Ramirez is a 75 y.o. female.  The history is provided by the patient.   Patient presents to the emergency department from her clinic after a fall today where she fell onto her left hip was noted to have a large left hip hematoma and was sent to the ER for further evaluation.  She's been able to ambulate on her leg since the fall.  She did not injure her head.  She denies neck pain.  No chest pain or abdominal pain.  She was walking upstairs when she fell towards her left because of balance issues she's had since her stroke.  She does not use a walker or cane.  She is on Plavix.  She presents with large hematoma of her left lateral hip.  She is ambulatory in the room   Past Medical History:  Diagnosis Date  . Cerebrovascular accident (stroke) ([place])    a. 12/2015 - cryptogenic.  S/P MDT Linq.  . Difficulty walking   . Essential hypertension   . History of dizziness   . Hypercholesterolemia   . Hypothyroidism   . Mitral regurgitation    a. 12/2015 Echo: EF 60-65%, mild focal basal hypertrophy. No rwma, triv AI, mild MR, mildly dil LA w/o thrombus. No RA thrombus. No PFO.  Marland Kitchen Palpitations   . Vitamin D deficiency     Patient Active Problem List   Diagnosis Date Noted  . Intracranial carotid stenosis 02/27/2016  . Essential hypertension   . Cerebrovascular accident (CVA) due to stenosis of right carotid artery ([place])   . Left-sided weakness 12/28/2015  . Accelerated hypertension 12/28/2015  . Headache 12/28/2015  . Adjustment reaction with anxiety 12/20/2015  . Stroke due to embolism of right middle cerebral artery ([place]) 12/17/2015  . Left hemiparesis ([place])   . Cerebrovascular accident (CVA) due to occlusion of cerebral artery ([place])   . History of CVA with residual deficit   . Prediabetes   . Right  Basal ganglia infarction ([place]) 12/14/2015  . Acute CVA (cerebrovascular accident) ([place]) 12/14/2015  . SVT (supraventricular tachycardia) ([place]) 12/14/2015  . Stroke ([place]) 12/13/2015  . Stroke-like symptom 12/13/2015  . History of stroke   . Heart palpitations 09/06/2012  . Hypertension   . HLD (hyperlipidemia)   . Hypothyroidism   . Vitamin D deficiency   . Palpitations   . History of dizziness   . LVH (left ventricular hypertrophy)   . Aortic stenosis   . Mitral regurgitation   . SOB (shortness of breath)   . Difficulty walking     Past Surgical History:  Procedure Laterality Date  . CARDIOVASCULAR STRESS TEST  2002   NORMAL  . EP IMPLANTABLE DEVICE N/A 12/17/2015   Procedure: Loop Recorder Insertion;  Surgeon: [name], MD;  Location: [facility];  Service: Cardiovascular;  Laterality: N/A;  . TEE WITHOUT CARDIOVERSION N/A 12/17/2015   Procedure: TRANSESOPHAGEAL ECHOCARDIOGRAM (TEE);  Surgeon: Lelon Perla, MD;  Location: [facility] ENDOSCOPY;  Service: Cardiovascular;  Laterality: N/A;  Pt also needs a LOOP  . TRANSTHORACIC ECHOCARDIOGRAM  2008   SHOWED LEFT VENTRICULAR HYPERTROPHY AND MILD AORTIC STENOSIS    OB History    No data available       Home Medications    Prior to Admission medications  Medication Sig Start Date End Date Taking? Authorizing Provider  ALPRAZolam (XANAX) 0.25 MG tablet Take 1 tablet (0.25 mg total) by mouth at bedtime. Patient taking differently: Take 0.25 mg by mouth at bedtime as needed. Pt takes as needed 12/25/15   Ivan Anchors Love, PA-C  amLODipine (NORVASC) 10 MG tablet Take 1 tablet (10 mg total) by mouth 2 (two) times daily. Reported on 12/27/2015 Patient taking differently: Take 10 mg by mouth daily. Reported on 12/27/2015 01/23/16   Rogelia Mire, NP  aspirin EC 325 MG EC tablet Take 1 tablet (325 mg total) by mouth daily. 12/17/15   Lavina Hamman, MD  B Complex-C-Folic Acid (STRESS FORMULA) TABS Take 1 tablet by mouth daily.  Reported on 01/24/2016    Historical Provider, MD  carboxymethylcellulose (REFRESH PLUS) 0.5 % SOLN Place 1 drop into both eyes 3 (three) times daily as needed.    Historical Provider, MD  clopidogrel (PLAVIX) 75 MG tablet Take 1 tablet (75 mg total) by mouth daily. 12/24/15   Bary Leriche, PA-C  docusate sodium (COLACE) 100 MG capsule Take 100 mg by mouth daily.    Historical Provider, MD  estradiol (ESTRACE) 0.1 MG/GM vaginal cream Place 2 g vaginally as needed (dryness).     Historical Provider, MD  irbesartan (AVAPRO) 300 MG tablet Take 1 tablet (300 mg total) by mouth daily. 12/29/15   Velvet Bathe, MD  levothyroxine (SYNTHROID, LEVOTHROID) 25 MCG tablet Take 25 mcg by mouth daily before breakfast.  09/21/14   Historical Provider, MD  LOTEMAX 0.5 % ophthalmic suspension Place 1 drop into both eyes 4 (four) times daily as needed. For dry itchy eyes 02/17/13   Historical Provider, MD  Multiple Vitamin (MULTIVITAMIN WITH MINERALS) TABS tablet Take 1 tablet by mouth daily.    Historical Provider, MD  polyethylene glycol (MIRALAX / GLYCOLAX) packet Take 17 g by mouth daily. Patient taking differently: Take 17 g by mouth daily as needed for moderate constipation.  12/17/15   Lavina Hamman, MD  sertraline (ZOLOFT) 50 MG tablet Take 50 mg by mouth daily.  01/02/16   Historical Provider, MD  UNABLE TO FIND Inject into the skin. PRAULENT 150MG /ML ONCE EVERY 14 DAYS FOR SUB    Historical Provider, MD    Family History Family History  Problem Relation Age of Onset  . Heart attack Mother   . Alzheimer's disease Mother     Social History Social History  Substance Use Topics  . Smoking status: Never Smoker  . Smokeless tobacco: Never Used  . Alcohol use Yes     Allergies   Sulfa drugs cross reactors; Zocor [simvastatin]; Crestor [rosuvastatin calcium]; and Micardis [telmisartan]   Review of Systems Review of Systems  All other systems reviewed and are negative.    Physical Exam Updated  Vital Signs BP 144/63   Pulse 101   Temp 98.4 F (36.9 C) (Oral)   Resp 18   SpO2 96%   Physical Exam  Constitutional: She is oriented to person, place, and time. She appears well-developed and well-nourished. No distress.  HENT:  Head: Normocephalic and atraumatic.  Eyes: EOM are normal.  Neck: Normal range of motion.  Cardiovascular: Normal rate, regular rhythm and normal heart sounds.   Pulmonary/Chest: Effort normal and breath sounds normal.  Abdominal: Soft. She exhibits no distension. There is no tenderness.  Musculoskeletal:  Full range of motion bilateral hips, knees, ankles.  Hematoma noted to the left lateral hip  Neurological: She is alert  and oriented to person, place, and time.  Skin: Skin is warm and dry.  Psychiatric: She has a normal mood and affect. Judgment normal.  Nursing note and vitals reviewed.    ED Treatments / Results  Labs (all labs ordered are listed, but only abnormal results are displayed) Labs Reviewed - No data to display  EKG  EKG Interpretation None       Radiology Dg Hip Unilat With Pelvis 2-3 Views Left  Result Date: 03/24/2016 CLINICAL DATA:  Left hip pain after fall down stairs today. EXAM: DG HIP (WITH OR WITHOUT PELVIS) 2-3V LEFT COMPARISON:  None. FINDINGS: There is no evidence of hip fracture or dislocation. There is no evidence of arthropathy or other focal bone abnormality. IMPRESSION: Normal left hip. Electronically Signed   By: Marijo Conception, M.D.   On: 03/24/2016 18:51    Procedures Procedures (including critical care time)  Medications Ordered in ED Medications - No data to display   Initial Impression / Assessment and Plan / ED Course  I have reviewed the triage vital signs and the nursing notes.  Pertinent labs & imaging results that were available during my care of the patient were reviewed by me and considered in my medical decision making (see chart for details).  Clinical Course    Patient is overall  well-appearing.  She is ambulatory in the emergency department.  X-ray left hip is normal.  Discharge home in good condition.  Primary care follow-up.  She understands to return to the ER for new or worsening symptoms   Final Clinical Impressions(s) / ED Diagnoses   Final diagnoses:  Hematoma of left hip, initial encounter  Fall, initial encounter    New Prescriptions New Prescriptions   No medications on file     Jola Schmidt, MD 03/24/16 [place], MD 06/23/16 [phone]

## 2016-03-25 ENCOUNTER — Ambulatory Visit: Payer: PPO | Admitting: Physical Therapy

## 2016-03-26 ENCOUNTER — Ambulatory Visit: Payer: PPO | Admitting: Physical Therapy

## 2016-03-26 DIAGNOSIS — R269 Unspecified abnormalities of gait and mobility: Secondary | ICD-10-CM | POA: Diagnosis not present

## 2016-03-26 DIAGNOSIS — R300 Dysuria: Secondary | ICD-10-CM | POA: Diagnosis not present

## 2016-03-26 DIAGNOSIS — T148 Other injury of unspecified body region: Secondary | ICD-10-CM | POA: Diagnosis not present

## 2016-03-26 DIAGNOSIS — E785 Hyperlipidemia, unspecified: Secondary | ICD-10-CM | POA: Diagnosis not present

## 2016-03-30 DIAGNOSIS — R260 Ataxic gait: Secondary | ICD-10-CM | POA: Diagnosis not present

## 2016-04-01 DIAGNOSIS — R260 Ataxic gait: Secondary | ICD-10-CM | POA: Diagnosis not present

## 2016-04-02 DIAGNOSIS — I639 Cerebral infarction, unspecified: Secondary | ICD-10-CM | POA: Diagnosis not present

## 2016-04-02 DIAGNOSIS — R3129 Other microscopic hematuria: Secondary | ICD-10-CM | POA: Diagnosis not present

## 2016-04-03 ENCOUNTER — Telehealth: Payer: Self-pay | Admitting: Neurology

## 2016-04-03 DIAGNOSIS — R260 Ataxic gait: Secondary | ICD-10-CM | POA: Diagnosis not present

## 2016-04-03 NOTE — Telephone Encounter (Signed)
Pt's husband called with a question about stopping her aspirin regimen. Please call and advise [phone]

## 2016-04-03 NOTE — Telephone Encounter (Signed)
Rn call patients husband back about when to stop the aspirin. Pt was last seen 02/27/2016 by Dr. Erlinda Hong. Rn stated in Dr. Erlinda Hong last note pt was to be on aspirin and plavix together for one month, than plavix alone. Rn stated pt is to only take plavix per Dr. Erlinda Hong note. Pts husband verbalized understand.

## 2016-04-07 ENCOUNTER — Encounter: Payer: PPO | Attending: Physical Medicine & Rehabilitation | Admitting: Physical Medicine & Rehabilitation

## 2016-04-07 ENCOUNTER — Encounter: Payer: Self-pay | Admitting: Cardiovascular Disease

## 2016-04-07 VITALS — BP 164/89 | HR 78 | Resp 17

## 2016-04-07 DIAGNOSIS — I517 Cardiomegaly: Secondary | ICD-10-CM | POA: Diagnosis not present

## 2016-04-07 DIAGNOSIS — R269 Unspecified abnormalities of gait and mobility: Secondary | ICD-10-CM

## 2016-04-07 DIAGNOSIS — E559 Vitamin D deficiency, unspecified: Secondary | ICD-10-CM | POA: Diagnosis not present

## 2016-04-07 DIAGNOSIS — I340 Nonrheumatic mitral (valve) insufficiency: Secondary | ICD-10-CM | POA: Insufficient documentation

## 2016-04-07 DIAGNOSIS — F419 Anxiety disorder, unspecified: Secondary | ICD-10-CM | POA: Insufficient documentation

## 2016-04-07 DIAGNOSIS — E785 Hyperlipidemia, unspecified: Secondary | ICD-10-CM | POA: Insufficient documentation

## 2016-04-07 DIAGNOSIS — I6350 Cerebral infarction due to unspecified occlusion or stenosis of unspecified cerebral artery: Secondary | ICD-10-CM

## 2016-04-07 DIAGNOSIS — R0609 Other forms of dyspnea: Secondary | ICD-10-CM | POA: Diagnosis not present

## 2016-04-07 DIAGNOSIS — E039 Hypothyroidism, unspecified: Secondary | ICD-10-CM | POA: Diagnosis not present

## 2016-04-07 DIAGNOSIS — R410 Disorientation, unspecified: Secondary | ICD-10-CM | POA: Diagnosis not present

## 2016-04-07 DIAGNOSIS — I69354 Hemiplegia and hemiparesis following cerebral infarction affecting left non-dominant side: Secondary | ICD-10-CM | POA: Diagnosis not present

## 2016-04-07 DIAGNOSIS — R262 Difficulty in walking, not elsewhere classified: Secondary | ICD-10-CM | POA: Insufficient documentation

## 2016-04-07 DIAGNOSIS — R471 Dysarthria and anarthria: Secondary | ICD-10-CM | POA: Insufficient documentation

## 2016-04-07 DIAGNOSIS — I10 Essential (primary) hypertension: Secondary | ICD-10-CM | POA: Insufficient documentation

## 2016-04-07 DIAGNOSIS — I350 Nonrheumatic aortic (valve) stenosis: Secondary | ICD-10-CM | POA: Diagnosis not present

## 2016-04-07 DIAGNOSIS — R531 Weakness: Secondary | ICD-10-CM

## 2016-04-07 DIAGNOSIS — E7800 Pure hypercholesterolemia, unspecified: Secondary | ICD-10-CM | POA: Diagnosis not present

## 2016-04-07 DIAGNOSIS — M6289 Other specified disorders of muscle: Secondary | ICD-10-CM | POA: Diagnosis not present

## 2016-04-07 HISTORY — DX: Unspecified abnormalities of gait and mobility: R26.9

## 2016-04-07 NOTE — Patient Instructions (Addendum)
THINK ABOUT SAFETY EACH AND EVERY DAY!   USE A STRAIGHT CANE FOR LONGER DISTANCES AND WHEN YOU WALK OUTSIDE THE HOUSE   CONSIDER WATER WALKING TO IMPROVE STRENGTH AND BALANCE   PLEASE CALL ME WITH ANY PROBLEMS OR QUESTIONS [phone])

## 2016-04-07 NOTE — Progress Notes (Signed)
Subjective:    Patient ID: Stacey Ramirez, female    DOB: [birth year], 75 y.o.   MRN: [id number]  HPI   Stacey Ramirez is here in follow up of her balance and gait deficits. She had a fall about 2 weeks ago when she picked up a couple grocery bags and lost her balance. She left suffered a hip contusion only which is slowly resolving. She feels that she let up her guard and had been doing well prior. She is not walking with walker or cane.        Pain Inventory Average Pain 2 Pain Right Now 0 My pain is NA  In the last 24 hours, has pain interfered with the following? General activity 0 Relation with others 0 Enjoyment of life 0 What TIME of day is your pain at its worst? morning Sleep (in general) NA  Pain is worse with: unsure Pain improves with: rest and therapy/exercise Relief from Meds: NA  Mobility walk with assistance how many minutes can you walk? 20 ability to climb steps?  yes do you drive?  no Do you have any goals in this area?  yes  Function retired I need assistance with the following:  household duties and shopping  Neuro/Psych bladder control problems weakness trouble walking confusion depression anxiety  Prior Studies Any changes since last visit?  yes x-rays  Physicians involved in your care Any changes since last visit?  yes Primary care .   Family History  Problem Relation Age of Onset  . Heart attack Mother   . Alzheimer's disease Mother    Social History   Social History  . Marital status: Married    Spouse name: N/A  . Number of children: 2  . Years of education: college   Occupational History  . retired    Social History Main Topics  . Smoking status: Never Smoker  . Smokeless tobacco: Never Used  . Alcohol use Yes  . Drug use: No  . Sexual activity: Not Asked   Other Topics Concern  . None   Social History Narrative  . None   Past Surgical History:  Procedure Laterality Date  . CARDIOVASCULAR STRESS TEST  2002     NORMAL  . EP IMPLANTABLE DEVICE N/A 12/17/2015   Procedure: Loop Recorder Insertion;  Surgeon: Stacey Grayer, MD;  Location: [facility];  Service: Cardiovascular;  Laterality: N/A;  . TEE WITHOUT CARDIOVERSION N/A 12/17/2015   Procedure: TRANSESOPHAGEAL ECHOCARDIOGRAM (TEE);  Surgeon: Stacey Perla, MD;  [facility] END[facility];  Service: Cardiovascular;  Laterality: N/A;  Pt also needs a LOOP  . TRANSTHORACIC ECHOCARDIOGRAM  2008   SHOWED LEFT VENTRICULAR HYPERTROPHY AND MILD AORTIC STENOSIS   Past Medical History:  Diagnosis Date  . Cerebrovascular accident (stroke) ([place])    a. 12/2015 - cryptogenic.  S/P MDT Linq.  . Difficulty walking   . Essential hypertension   . History of dizziness   . Hypercholesterolemia   . Hypothyroidism   . Mitral regurgitation    a. 12/2015 Echo: EF 60-65%, mild focal basal hypertrophy. No rwma, triv AI, mild MR, mildly dil LA w/o thrombus. No RA thrombus. No PFO.  Marland Kitchen Palpitations   . Vitamin D deficiency    BP (!) 164/89   Pulse 78   Resp 17   SpO2 96%   Opioid Risk Score:   Fall Risk Score:  `1  Depression screen PHQ 2/9  Depression screen [facility] 2/9 01/30/2016 01/24/2016 01/03/2016  Decreased Interest  0 0 3  Down, Depressed, Hopeless 0 0 2  PHQ - 2 Score 0 0 5  Altered sleeping - - 1  Tired, decreased energy - - 3  Change in appetite - - 1  Feeling bad or failure about yourself  - - 2  Trouble concentrating - - 1  Moving slowly or fidgety/restless - - 2  Suicidal thoughts - - 0  PHQ-9 Score - - 15    Review of Systems  Constitutional:       Bladder control problems   Gastrointestinal: Positive for constipation.  Musculoskeletal: Positive for gait problem.  Neurological: Positive for weakness.  Hematological: Bruises/bleeds easily.  Psychiatric/Behavioral: Positive for confusion and dysphoric mood. The patient is nervous/anxious.   All other systems reviewed and are negative.      Objective:   Physical Exam  Head:  Normocephalic and atraumatic.  Right Ear: External ear normal.  Left Ear: External ear normal.  Eyes: Conjunctivae are normal. Pupils are equal, round, and reactive to light.  Neck: Normal range of motion. Neck supple. No tracheal deviation present. No thyromegaly present.  Cardiovascular: Normal rate and regular rhythm. Exam reveals no friction rub.  Murmur heard. Respiratory: Effort normal and breath sounds normal. No stridor. No respiratory distress. She has no wheezes. She has no rales.  GI: Bowel sounds are normal. She exhibits no distension. There is no tenderness.  Musculoskeletal: She exhibits no edema or tenderness.  Neurological: She is alert and oriented to person, place, and time.  Mild left facial weakness. Soft voice but no dysarthria. Able to follow one and two step commands without difficulty. Reasonable insight and awareness. Favors left leg during gait. Needed extra time to change direction Motor:  Right upper extremity: 5/5 proximal distal  RLE: 5/5HF,KE and 4 to 4+ ADF/PF Left upper; 4/5 deltoid, bicep, tricep, wrist, hand Left lower extremity and 4HF, 4 KE 4 ADF/PF DTRs symmetric. No sensory deficits Sensation intact light touch  Skin: Skin is warm and dry. No rash noted. No erythema.  Psychiatric: remains anxious but pleasant. Often apologizes for her anxiety         Assessment & Plan:  1. Gait and functional deficits secondary to right basal ganglia infarct                       -discussed safety awareness and use of a straight cane in left hand.    -consider water walking  2. DVT Prophylaxis/Anticoagulation: Pharmaceutical: Lovenox 3. Pain Management:tylenol prn 4. AS/HTN: per primary   Follow up with me PRN. Fifteen minutes of face to face patient care time were spent during this visit. All questions were encouraged and answered.  Marland Kitchen

## 2016-04-13 LAB — CUP PACEART REMOTE DEVICE CHECK: MDC IDC SESS DTM: 20170814190730

## 2016-04-13 NOTE — Progress Notes (Signed)
Carelink summary report received. Battery status OK. Normal device function. Good histograms. No new symptom episodes, tachy episodes, brady, or pause episodes. 5 AF 0%, 2 with ECGs appear SR with PVCs. Monthly summary reports and ROV/PRN

## 2016-04-14 DIAGNOSIS — R260 Ataxic gait: Secondary | ICD-10-CM | POA: Diagnosis not present

## 2016-04-15 ENCOUNTER — Ambulatory Visit (INDEPENDENT_AMBULATORY_CARE_PROVIDER_SITE_OTHER): Payer: PPO | Admitting: *Deleted

## 2016-04-15 DIAGNOSIS — I639 Cerebral infarction, unspecified: Secondary | ICD-10-CM | POA: Diagnosis not present

## 2016-04-16 NOTE — Progress Notes (Signed)
Carelink Summary Report / Loop Recorder 

## 2016-04-17 ENCOUNTER — Other Ambulatory Visit: Payer: Self-pay | Admitting: *Deleted

## 2016-04-17 ENCOUNTER — Encounter: Payer: Self-pay | Admitting: *Deleted

## 2016-04-17 NOTE — Patient Outreach (Addendum)
[facility]) Care Management   04/17/2016  Stacey Ramirez [birth year] [id number]  Stacey Ramirez is an 75 y.o. female  Subjective:  HTN: Pt reports she does not have any signs or symptoms but she has two BP device (intellSense) has been reading more of a correct readings verse her [place] which has been reading higher BP reads. Pt will compare to the providers office on the office visit. Reports she takes her BP daily and documents all readings with no delays. Pt also states she is taking all her medications with no delays or issues. SWELLING: Pt reports she always has some swelling ot the top right of her foot (history).  SAFETY: Pt reports she continues to work with PT with out patient therapy 2-3 days weekly.  Pt has a ED visit in August and had several exrays that were clear as pt returned home with no changes in her medications. Primary provider aware with no other interventions. APPOINTMENTS: Pt reports she will follow up with CAD tow additional appointments and with her neurologist for her 4 months visit. Pt will follow up with her primary provided 10/2 Dr. Shelia Media.    Objective:   Review of Systems  Constitutional: Negative.   HENT: Negative.   Eyes: Negative.   Respiratory: Negative.   Cardiovascular: Negative.   Gastrointestinal: Negative.   Genitourinary: Negative.   Musculoskeletal: Negative.   Skin: Negative.   Neurological: Negative.   Endo/Heme/Allergies: Negative.   Psychiatric/Behavioral: Negative.     Physical Exam  Constitutional: She is oriented to person, place, and time. She appears well-developed.  HENT:  Right Ear: External ear normal.  Left Ear: External ear normal.  Eyes: EOM are normal.  Neck: Normal range of motion.  Cardiovascular: Normal heart sounds.   Respiratory: Effort normal and breath sounds normal.  GI: Bowel sounds are normal.  Musculoskeletal: She exhibits edema.  Note trace edema to the lower right foot with some puffiness to  the top of her foot.  Neurological: She is alert and oriented to person, place, and time.  Skin: Skin is warm and dry.  Psychiatric: She has a normal mood and affect. Her behavior is normal. Judgment and thought content normal.    Encounter Medications:   Outpatient Encounter Prescriptions as of 04/17/2016  Medication Sig Note  . ALPRAZolam (XANAX) 0.25 MG tablet Take 1 tablet (0.25 mg total) by mouth at bedtime. (Patient taking differently: Take 0.25 mg by mouth at bedtime as needed. Pt takes as needed)   . amLODipine (NORVASC) 10 MG tablet Take 1 tablet (10 mg total) by mouth 2 (two) times daily. Reported on 12/27/2015 (Patient taking differently: Take 10 mg by mouth daily. Reported on 12/27/2015) 01/24/2016: Per spouse, patient is taking 1 tablet 10 mg once daily  . B Complex-C-Folic Acid (STRESS FORMULA) TABS Take 1 tablet by mouth daily. Reported on 01/24/2016   . carboxymethylcellulose (REFRESH PLUS) 0.5 % SOLN Place 1 drop into both eyes 3 (three) times daily as needed.   . clopidogrel (PLAVIX) 75 MG tablet Take 1 tablet (75 mg total) by mouth daily.   Marland Kitchen docusate sodium (COLACE) 100 MG capsule Take 100 mg by mouth daily.   Marland Kitchen estradiol (ESTRACE) 0.1 MG/GM vaginal cream Place 2 g vaginally as needed (dryness).    . irbesartan (AVAPRO) 300 MG tablet Take 1 tablet (300 mg total) by mouth daily.   Marland Kitchen levothyroxine (SYNTHROID, LEVOTHROID) 25 MCG tablet Take 25 mcg by mouth daily before breakfast.    .  LOTEMAX 0.5 % ophthalmic suspension Place 1 drop into both eyes 4 (four) times daily as needed. For dry itchy eyes   . Multiple Vitamin (MULTIVITAMIN WITH MINERALS) TABS tablet Take 1 tablet by mouth daily.   . polyethylene glycol (MIRALAX / GLYCOLAX) packet Take 17 g by mouth daily. (Patient taking differently: Take 17 g by mouth daily as needed for moderate constipation. )   . PRALUENT 150 MG/ML SOPN  04/07/2016: Received from: External Pharmacy  . sertraline (ZOLOFT) 50 MG tablet Take 50 mg by mouth  daily.     No facility-administered encounter medications on file as of 04/17/2016.     Functional Status:   In your present state of health, do you have any difficulty performing the following activities: 01/30/2016 01/24/2016  Hearing? N N  Vision? N N  Difficulty concentrating or making decisions? Tempie Donning  Walking or climbing stairs? N Y  Dressing or bathing? N N  Doing errands, shopping? Tempie Donning  Preparing Food and eating ? N -  Using the Toilet? N -  In the past six months, have you accidently leaked urine? N -  Do you have problems with loss of bowel control? N -  Managing your Medications? N -  Managing your Finances? N -  Housekeeping or managing your Housekeeping? Y -  Some recent data might be hidden    Fall/Depression Screening:    PHQ 2/9 Scores 01/30/2016 01/24/2016 01/03/2016  PHQ - 2 Score 0 0 5  PHQ- 9 Score - - 15  BP 120/60 (BP Location: Left Arm, Patient Position: Sitting, Cuff Size: Normal)   Pulse 79   Resp 20   Ht 1.575 m (5\' 2" )   Wt 115 lb (52.2 kg)   SpO2 98%   BMI 21.03 kg/m   Assessment:   Case management related to HTN Edema related to right foot Adherence related to medical appointments Safety related ongoing PT therapy.  Plan:  Will verify pt continues to monitor her ongoing BP and aware when to contact her provider with abnormal parameters or readings. Encouraged pt to take her BP device on her next provider's visit and request if possible to calibrate her device if possible. Will completed a manual reading to comparison to her home device. Verified the Intell-Sense BP device is more adequate to RN manual read today. Will continue to encouraged pt on adherence with daily monitoring as recommended by her provider. Will continues to encouraged pt not to cross her legs to allow more circulation to her legs and to elevate her lower legs to reduce ongoing swelling or use of compression stockings.  Will continues to encouraged pt to attend all appointments in  managing her ongoing medical issues.  Will continue ongoing PT therapy as pt continues to process with no falls during her ongoing therapy. RN confirmed pt continues with 2-3 days weekly with the ongoing therapy. Will verify no falls have occurred since her ED visit in August. Will strongly encouraged pt to use safety precautions when ambulating as pt verified she is continues to work with PT on strengthening exercises. Plan of care and all goals discussed as RN will continue to encouraged pt to manage her care with the assistance of her spouse who is the primary caregiver.  Based upon pt's ongoing progress will discharge pt today via [facility] services due to her meeting all her goals via plan of care. Will notify her primary provider of her progress.   Raina Mina, RN Care Management Coordinator [facility] [phone]

## 2016-04-19 ENCOUNTER — Emergency Department (HOSPITAL_BASED_OUTPATIENT_CLINIC_OR_DEPARTMENT_OTHER): Payer: PPO

## 2016-04-19 ENCOUNTER — Emergency Department (HOSPITAL_BASED_OUTPATIENT_CLINIC_OR_DEPARTMENT_OTHER)
Admission: EM | Admit: 2016-04-19 | Discharge: 2016-04-19 | Disposition: A | Discharge status: Home / Self Care | Payer: PPO | Attending: Emergency Medicine | Admitting: Emergency Medicine

## 2016-04-19 ENCOUNTER — Encounter (HOSPITAL_BASED_OUTPATIENT_CLINIC_OR_DEPARTMENT_OTHER): Payer: Self-pay | Admitting: Emergency Medicine

## 2016-04-19 DIAGNOSIS — E039 Hypothyroidism, unspecified: Secondary | ICD-10-CM | POA: Insufficient documentation

## 2016-04-19 DIAGNOSIS — Z79899 Other long term (current) drug therapy: Secondary | ICD-10-CM | POA: Diagnosis not present

## 2016-04-19 DIAGNOSIS — I10 Essential (primary) hypertension: Secondary | ICD-10-CM | POA: Diagnosis not present

## 2016-04-19 DIAGNOSIS — Z8673 Personal history of transient ischemic attack (TIA), and cerebral infarction without residual deficits: Secondary | ICD-10-CM | POA: Insufficient documentation

## 2016-04-19 DIAGNOSIS — N200 Calculus of kidney: Secondary | ICD-10-CM | POA: Insufficient documentation

## 2016-04-19 DIAGNOSIS — N132 Hydronephrosis with renal and ureteral calculous obstruction: Secondary | ICD-10-CM | POA: Diagnosis not present

## 2016-04-19 DIAGNOSIS — R109 Unspecified abdominal pain: Secondary | ICD-10-CM | POA: Diagnosis not present

## 2016-04-19 LAB — CBC WITH DIFFERENTIAL/PLATELET
BASOS ABS: 0 10*3/uL (ref 0.0–0.1)
BASOS PCT: 0 %
Eosinophils Absolute: 0 10*3/uL (ref 0.0–0.7)
Eosinophils Relative: 0 %
HEMATOCRIT: 38.9 % (ref 36.0–46.0)
HEMOGLOBIN: 13.1 g/dL (ref 12.0–15.0)
LYMPHS PCT: 4 %
Lymphs Abs: 0.5 10*3/uL — ABNORMAL LOW (ref 0.7–4.0)
MCH: 32.4 pg (ref 26.0–34.0)
MCHC: 33.7 g/dL (ref 30.0–36.0)
MCV: 96.3 fL (ref 78.0–100.0)
Monocytes Absolute: 0.5 10*3/uL (ref 0.1–1.0)
Monocytes Relative: 4 %
NEUTROS ABS: 13 10*3/uL — AB (ref 1.7–7.7)
NEUTROS PCT: 92 %
Platelets: 202 10*3/uL (ref 150–400)
RBC: 4.04 MIL/uL (ref 3.87–5.11)
RDW: 13.3 % (ref 11.5–15.5)
WBC: 14.1 10*3/uL — AB (ref 4.0–10.5)

## 2016-04-19 LAB — COMPREHENSIVE METABOLIC PANEL
ALBUMIN: 4.3 g/dL (ref 3.5–5.0)
ALT: 28 U/L (ref 14–54)
AST: 29 U/L (ref 15–41)
Alkaline Phosphatase: 61 U/L (ref 38–126)
Anion gap: 8 (ref 5–15)
BILIRUBIN TOTAL: 0.6 mg/dL (ref 0.3–1.2)
BUN: 16 mg/dL (ref 6–20)
CO2: 26 mmol/L (ref 22–32)
CREATININE: 0.75 mg/dL (ref 0.44–1.00)
Calcium: 9.1 mg/dL (ref 8.9–10.3)
Chloride: 104 mmol/L (ref 101–111)
GFR calc Af Amer: >60 mL/min (ref >60)
GLUCOSE: 147 mg/dL — AB (ref 65–99)
POTASSIUM: 3.6 mmol/L (ref 3.5–5.1)
Sodium: 138 mmol/L (ref 135–145)
TOTAL PROTEIN: 7.4 g/dL (ref 6.5–8.1)

## 2016-04-19 LAB — URINE MICROSCOPIC-ADD ON

## 2016-04-19 LAB — URINALYSIS, ROUTINE W REFLEX MICROSCOPIC
BILIRUBIN URINE: NEGATIVE
GLUCOSE, UA: NEGATIVE mg/dL
HGB URINE DIPSTICK: NEGATIVE
Ketones, ur: NEGATIVE mg/dL
Leukocytes, UA: NEGATIVE
Nitrite: NEGATIVE
PH: 6.5 (ref 5.0–8.0)
Protein, ur: 30 mg/dL — AB
SPECIFIC GRAVITY, URINE: 1.016 (ref 1.005–1.030)

## 2016-04-19 MED ORDER — SODIUM CHLORIDE 0.9 % IV BOLUS (SEPSIS)
1000.0000 mL | Freq: Once | INTRAVENOUS | Status: AC
  Administered 2016-04-19: 1000 mL via INTRAVENOUS

## 2016-04-19 MED ORDER — TAMSULOSIN HCL 0.4 MG PO CAPS: 0.4000 mg | ORAL_CAPSULE | Freq: Every day | ORAL | 0 refills | Status: DC

## 2016-04-19 MED ORDER — ONDANSETRON 4 MG PO TBDP: ORAL_TABLET | ORAL | 0 refills | Status: DC

## 2016-04-19 MED ORDER — HYDROCODONE-ACETAMINOPHEN 5-325 MG PO TABS: 1.0000 | ORAL_TABLET | ORAL | 0 refills | Status: DC | PRN

## 2016-04-19 MED ORDER — TAMSULOSIN HCL 0.4 MG PO CAPS
0.4000 mg | ORAL_CAPSULE | Freq: Once | ORAL | Status: AC
  Administered 2016-04-19: 0.4 mg via ORAL
  Filled 2016-04-19: qty 1

## 2016-04-19 MED ORDER — FENTANYL CITRATE (PF) 100 MCG/2ML IJ SOLN: 50.0000 ug | Freq: Once | INTRAMUSCULAR | Status: DC

## 2016-04-19 MED ORDER — CEPHALEXIN 500 MG PO CAPS: 500.0000 mg | ORAL_CAPSULE | Freq: Four times a day (QID) | ORAL | 0 refills | Status: DC

## 2016-04-19 MED ORDER — KETOROLAC TROMETHAMINE 30 MG/ML IJ SOLN
10.0000 mg | Freq: Once | INTRAMUSCULAR | Status: AC
  Administered 2016-04-19: 9.9 mg via INTRAVENOUS
  Filled 2016-04-19: qty 1

## 2016-04-19 MED ORDER — ONDANSETRON HCL 4 MG/2ML IJ SOLN: 4.0000 mg | Freq: Once | INTRAMUSCULAR | Status: DC

## 2016-04-19 MED ORDER — CEPHALEXIN 250 MG PO CAPS
500.0000 mg | ORAL_CAPSULE | Freq: Once | ORAL | Status: AC
  Administered 2016-04-19: 500 mg via ORAL
  Filled 2016-04-19: qty 2

## 2016-04-19 NOTE — ED Notes (Signed)
MD at bedside. 

## 2016-04-19 NOTE — ED Notes (Signed)
Pt and husband given d/c instructions as per chart. Verbalizes understanding. No questions. Rx x 4

## 2016-04-19 NOTE — ED Provider Notes (Signed)
[facility] [place] DEPT [facility] Provider Note   CSN: [id number] Arrival date & time: 04/19/16  1119     History   Chief Complaint Chief Complaint  Patient presents with  . Flank Pain    HPI CHARLA FURY is a 75 y.o. female.  The history is provided by the patient.  Flank Pain  This is a new problem. The current episode started more than 2 days ago. The problem occurs daily. The problem has been gradually improving. Pertinent negatives include no abdominal pain. Nothing aggravates the symptoms. Nothing relieves the symptoms. She has tried nothing for the symptoms.    Past Medical History:  Diagnosis Date  . Cerebrovascular accident (stroke) ([place])    a. 12/2015 - cryptogenic.  S/P MDT Linq.  . Difficulty walking   . Essential hypertension   . History of dizziness   . Hypercholesterolemia   . Hypothyroidism   . Mitral regurgitation    a. 12/2015 Echo: EF 60-65%, mild focal basal hypertrophy. No rwma, triv AI, mild MR, mildly dil LA w/o thrombus. No RA thrombus. No PFO.  Marland Kitchen Palpitations   . Vitamin D deficiency     Patient Active Problem List   Diagnosis Date Noted  . Gait disorder 04/07/2016  . Intracranial carotid stenosis 02/27/2016  . Essential hypertension   . Cerebrovascular accident (CVA) due to stenosis of right carotid artery ([place])   . Left-sided weakness 12/28/2015  . Accelerated hypertension 12/28/2015  . Headache 12/28/2015  . Adjustment reaction with anxiety 12/20/2015  . Stroke due to embolism of right middle cerebral artery ([place]) 12/17/2015  . Left hemiparesis ([place])   . Cerebrovascular accident (CVA) due to occlusion of cerebral artery ([place])   . History of CVA with residual deficit   . Prediabetes   . Right Basal ganglia infarction ([place]) 12/14/2015  . Acute CVA (cerebrovascular accident) ([place]) 12/14/2015  . SVT (supraventricular tachycardia) ([place]) 12/14/2015  . Stroke ([place]) 12/13/2015  . Stroke-like symptom 12/13/2015  . History of stroke   . Heart  palpitations 09/06/2012  . Hypertension   . HLD (hyperlipidemia)   . Hypothyroidism   . Vitamin D deficiency   . Palpitations   . History of dizziness   . LVH (left ventricular hypertrophy)   . Aortic stenosis   . Mitral regurgitation   . SOB (shortness of breath)   . Difficulty walking     Past Surgical History:  Procedure Laterality Date  . CARDIOVASCULAR STRESS TEST  2002   NORMAL  . EP IMPLANTABLE DEVICE N/A 12/17/2015   Procedure: Loop Recorder Insertion;  Surgeon: [name], MD;  Location: [place] [facility];  Service: Cardiovascular;  Laterality: N/A;  . TEE WITHOUT CARDIOVERSION N/A 12/17/2015   Procedure: TRANSESOPHAGEAL ECHOCARDIOGRAM (TEE);  Surgeon: Lelon Perla, MD;  Location[facility] ENDOSCOPY;  Service: Cardiovascular;  Laterality: N/A;  Pt also needs a LOOP  . TRANSTHORACIC ECHOCARDIOGRAM  2008   SHOWED LEFT VENTRICULAR HYPERTROPHY AND MILD AORTIC STENOSIS    OB History    No data available       Home Medications    Prior to Admission medications   Medication Sig Start Date End Date Taking? Authorizing Provider  ALPRAZolam (XANAX) 0.25 MG tablet Take 1 tablet (0.25 mg total) by mouth at bedtime. Patient taking differently: Take 0.25 mg by mouth at bedtime as needed. Pt takes as needed 12/25/15   Ivan Anchors Love, PA-C  amLODipine (NORVASC) 10 MG tablet Take 1 tablet (10 mg total) by mouth 2 (two)  times daily. Reported on 12/27/2015 Patient taking differently: Take 10 mg by mouth daily. Reported on 12/27/2015 01/23/16   Rogelia Mire, NP  B Complex-C-Folic Acid (STRESS FORMULA) TABS Take 1 tablet by mouth daily. Reported on 01/24/2016    Historical Provider, MD  carboxymethylcellulose (REFRESH PLUS) 0.5 % SOLN Place 1 drop into both eyes 3 (three) times daily as needed.    Historical Provider, MD  cephALEXin (KEFLEX) 500 MG capsule Take 1 capsule (500 mg total) by mouth 4 (four) times daily. 04/19/16   Merrily Pew, MD  clopidogrel (PLAVIX) 75 MG tablet Take 1  tablet (75 mg total) by mouth daily. 12/24/15   Bary Leriche, PA-C  docusate sodium (COLACE) 100 MG capsule Take 100 mg by mouth daily.    Historical Provider, MD  estradiol (ESTRACE) 0.1 MG/GM vaginal cream Place 2 g vaginally as needed (dryness).     Historical Provider, MD  HYDROcodone-acetaminophen (NORCO) 5-325 MG tablet Take 1 tablet by mouth every 4 (four) hours as needed. 04/19/16   Merrily Pew, MD  irbesartan (AVAPRO) 300 MG tablet Take 1 tablet (300 mg total) by mouth daily. 12/29/15   Velvet Bathe, MD  levothyroxine (SYNTHROID, LEVOTHROID) 25 MCG tablet Take 25 mcg by mouth daily before breakfast.  09/21/14   Historical Provider, MD  LOTEMAX 0.5 % ophthalmic suspension Place 1 drop into both eyes 4 (four) times daily as needed. For dry itchy eyes 02/17/13   Historical Provider, MD  Multiple Vitamin (MULTIVITAMIN WITH MINERALS) TABS tablet Take 1 tablet by mouth daily.    Historical Provider, MD  ondansetron (ZOFRAN ODT) 4 MG disintegrating tablet 4mg  ODT q4 hours prn nausea/vomit 04/19/16   Merrily Pew, MD  polyethylene glycol (MIRALAX / GLYCOLAX) packet Take 17 g by mouth daily. Patient taking differently: Take 17 g by mouth daily as needed for moderate constipation.  12/17/15   Lavina Hamman, MD  PRALUENT 150 MG/ML SOPN  03/09/16   Historical Provider, MD  sertraline (ZOLOFT) 50 MG tablet Take 50 mg by mouth daily.  01/02/16   Historical Provider, MD  tamsulosin (FLOMAX) 0.4 MG CAPS capsule Take 1 capsule (0.4 mg total) by mouth daily. 04/19/16   Merrily Pew, MD    Family History Family History  Problem Relation Age of Onset  . Heart attack Mother   . Alzheimer's disease Mother     Social History Social History  Substance Use Topics  . Smoking status: Never Smoker  . Smokeless tobacco: Never Used  . Alcohol use Yes     Allergies   Sulfa drugs cross reactors; Zocor [simvastatin]; Crestor [rosuvastatin calcium]; and Micardis [telmisartan]   Review of Systems Review of Systems   Gastrointestinal: Positive for nausea and vomiting. Negative for abdominal pain.  Genitourinary: Positive for flank pain.  Musculoskeletal: Positive for back pain (right flank pain).  All other systems reviewed and are negative.    Physical Exam Updated Vital Signs BP 169/80 (BP Location: Left Arm)   Pulse 80   Temp 98.6 F (37 C) (Oral)   Resp 16   Ht 5\' 2"  (1.575 m)   Wt 115 lb (52.2 kg)   SpO2 97%   BMI 21.03 kg/m   Physical Exam  Constitutional: She appears well-developed and well-nourished. No distress.  HENT:  Head: Normocephalic and atraumatic.  Eyes: Conjunctivae are normal.  Neck: Neck supple.  Cardiovascular: Normal rate and regular rhythm.   No murmur heard. Pulmonary/Chest: Effort normal and breath sounds normal. No respiratory distress.  Abdominal: Soft. There is no tenderness.  Musculoskeletal: She exhibits no edema.  Neurological: She is alert.  Skin: Skin is warm and dry.  Psychiatric: She has a normal mood and affect.  Nursing note and vitals reviewed.    ED Treatments / Results  Labs (all labs ordered are listed, but only abnormal results are displayed) Labs Reviewed  URINALYSIS, ROUTINE W REFLEX MICROSCOPIC (NOT AT [facility]) - Abnormal; Notable for the following:       Result Value   Protein, ur 30 (*)    All other components within normal limits  URINE MICROSCOPIC-ADD ON - Abnormal; Notable for the following:    Squamous Epithelial / LPF 0-5 (*)    Bacteria, UA MANY (*)    All other components within normal limits  CBC WITH DIFFERENTIAL/PLATELET - Abnormal; Notable for the following:    WBC 14.1 (*)    Neutro Abs 13.0 (*)    Lymphs Abs 0.5 (*)    All other components within normal limits  COMPREHENSIVE METABOLIC PANEL - Abnormal; Notable for the following:    Glucose, Bld 147 (*)    All other components within normal limits    EKG  EKG Interpretation None       Radiology Ct Renal Stone Study  Result Date: 04/19/2016 CLINICAL  DATA:  Patient with right flank pain. No hematuria. Difficulty with urination. EXAM: CT ABDOMEN AND PELVIS WITHOUT CONTRAST TECHNIQUE: Multidetector CT imaging of the abdomen and pelvis was performed following the standard protocol without IV contrast. COMPARISON:  None. FINDINGS: Lower chest: Normal heart size. Minimal atelectasis within the lung bases bilaterally. No pleural effusion. Hepatobiliary: Liver is normal in size and contour. Layering high attenuation within the gallbladder lumen suggestive of sludge. Pancreas: Unremarkable Spleen: Unremarkable Adrenals/Urinary Tract: The adrenal glands are normal. There is enlargement of the right kidney, right perinephric fat stranding and moderate right hydroureteronephrosis to the distal right ureter were there is an obstructing 4 mm stone (image 64; series 2). Additional nonobstructing 4 mm stone demonstrated within the superior pole of the right kidney. There is a 9 mm angiomyolipoma within the superior pole of the left kidney. Stomach/Bowel: No abnormal bowel wall thickening or evidence for bowel obstruction. No free fluid or free intraperitoneal air. Normal appendix. No evidence for bowel obstruction. Normal morphology of the stomach. Vascular/Lymphatic: Aortic vascular calcifications. No retroperitoneal lymphadenopathy. Reproductive: Unremarkable Other: Nonspecific 4 cm soft tissue mass within the subcutaneous fat overlying the left proximal femur (image 78; series 2). Musculoskeletal: No aggressive or acute appearing osseous lesions. Lumbar spine degenerative changes. IMPRESSION: There is an obstructing 4 mm stone within the distal right ureter resulting in moderate right hydroureteronephrosis. Nonspecific soft tissue masslike density within the subcutaneous fat overlying the left proximal femur. Recommend clinical correlation for hematoma or other type of mass. Aortic atherosclerosis. Additional nonobstructing stone superior pole right kidney. Electronically  Signed   By: Lovey Newcomer M.D.   On: 04/19/2016 14:22    Procedures Procedures (including critical care time)  Medications Ordered in ED Medications  sodium chloride 0.9 % bolus 1,000 mL (0 mLs Intravenous Stopped 04/19/16 1752)  tamsulosin (FLOMAX) capsule 0.4 mg (0.4 mg Oral Given 04/19/16 1618)  ketorolac (TORADOL) 30 MG/ML injection 9.9 mg (9.9 mg Intravenous Given 04/19/16 1618)  cephALEXin (KEFLEX) capsule 500 mg (500 mg Oral Given 04/19/16 1618)     Initial Impression / Assessment and Plan / ED Course  I have reviewed the triage vital signs and the nursing notes.  Pertinent  labs & imaging results that were available during my care of the patient were reviewed by me and considered in my medical decision making (see chart for details).  Clinical Course    Kidney stone with bacteruria. Pain controlled. No AKI, tolerating PO. Plan for dc and [facility] follow up.   Final Clinical Impressions(s) / ED Diagnoses   Final diagnoses:  Kidney stone    New Prescriptions Discharge Medication List as of 04/19/2016  4:15 PM    START taking these medications   Details  cephALEXin (KEFLEX) 500 MG capsule Take 1 capsule (500 mg total) by mouth 4 (four) times daily., Starting Sun 04/19/2016, Print    HYDROcodone-acetaminophen (NORCO) 5-325 MG tablet Take 1 tablet by mouth every 4 (four) hours as needed., Starting Sun 04/19/2016, Print    ondansetron (ZOFRAN ODT) 4 MG disintegrating tablet 4mg  ODT q4 hours prn nausea/vomit, Print    tamsulosin (FLOMAX) 0.4 MG CAPS capsule Take 1 capsule (0.4 mg total) by mouth daily., Starting Sun 04/19/2016, Print         Merrily Pew, MD 04/19/16 [id number]

## 2016-04-19 NOTE — ED Triage Notes (Signed)
Patient reports right sided flank pain.  States this began yesterday.  Reports nausea, vomiting.  Denies dysuria, hematuria.

## 2016-04-21 ENCOUNTER — Encounter: Payer: PPO | Admitting: Cardiovascular Disease

## 2016-04-21 DIAGNOSIS — R3121 Asymptomatic microscopic hematuria: Secondary | ICD-10-CM | POA: Diagnosis not present

## 2016-04-21 DIAGNOSIS — R1084 Generalized abdominal pain: Secondary | ICD-10-CM | POA: Diagnosis not present

## 2016-04-21 DIAGNOSIS — N202 Calculus of kidney with calculus of ureter: Secondary | ICD-10-CM | POA: Diagnosis not present

## 2016-04-21 DIAGNOSIS — N23 Unspecified renal colic: Secondary | ICD-10-CM | POA: Diagnosis not present

## 2016-04-23 DIAGNOSIS — N201 Calculus of ureter: Secondary | ICD-10-CM | POA: Diagnosis not present

## 2016-04-27 DIAGNOSIS — R852 Abnormal level of other drugs, medicaments and biological substances in specimens from digestive organs and abdominal cavity: Secondary | ICD-10-CM | POA: Diagnosis not present

## 2016-04-27 DIAGNOSIS — R822 Biliuria: Secondary | ICD-10-CM | POA: Diagnosis not present

## 2016-04-27 DIAGNOSIS — K828 Other specified diseases of gallbladder: Secondary | ICD-10-CM | POA: Diagnosis not present

## 2016-04-27 DIAGNOSIS — N200 Calculus of kidney: Secondary | ICD-10-CM | POA: Diagnosis not present

## 2016-04-27 DIAGNOSIS — R8290 Unspecified abnormal findings in urine: Secondary | ICD-10-CM | POA: Diagnosis not present

## 2016-05-05 DIAGNOSIS — N201 Calculus of ureter: Secondary | ICD-10-CM | POA: Diagnosis not present

## 2016-05-06 DIAGNOSIS — R260 Ataxic gait: Secondary | ICD-10-CM | POA: Diagnosis not present

## 2016-05-07 ENCOUNTER — Telehealth: Payer: Self-pay

## 2016-05-07 NOTE — Telephone Encounter (Signed)
Called pt and asked her to send in a manual transmission, pt voiced understanding. Informed pt that I would call her back and let her know that I received the transmission.

## 2016-05-07 NOTE — Telephone Encounter (Signed)
Called and spoke with pts husband and informed him that the transmission was normal, he voiced understanding.

## 2016-05-08 DIAGNOSIS — R260 Ataxic gait: Secondary | ICD-10-CM | POA: Diagnosis not present

## 2016-05-08 DIAGNOSIS — R300 Dysuria: Secondary | ICD-10-CM | POA: Diagnosis not present

## 2016-05-09 LAB — CUP PACEART REMOTE DEVICE CHECK: Date Time Interrogation Session: 20170913190742

## 2016-05-09 NOTE — Progress Notes (Signed)
Carelink summary report received. Battery status OK. Normal device function. No new symptom episodes, tachy episodes, brady, or pause episodes. 12 AF episodes are SR with ventricular ectopy.  Monthly summary reports and ROV/PRN

## 2016-05-12 DIAGNOSIS — N200 Calculus of kidney: Secondary | ICD-10-CM | POA: Diagnosis not present

## 2016-05-13 DIAGNOSIS — R260 Ataxic gait: Secondary | ICD-10-CM | POA: Diagnosis not present

## 2016-05-14 DIAGNOSIS — R260 Ataxic gait: Secondary | ICD-10-CM | POA: Diagnosis not present

## 2016-05-15 ENCOUNTER — Ambulatory Visit (INDEPENDENT_AMBULATORY_CARE_PROVIDER_SITE_OTHER): Payer: PPO | Admitting: *Deleted

## 2016-05-15 DIAGNOSIS — I639 Cerebral infarction, unspecified: Secondary | ICD-10-CM | POA: Diagnosis not present

## 2016-05-18 DIAGNOSIS — R260 Ataxic gait: Secondary | ICD-10-CM | POA: Diagnosis not present

## 2016-05-18 NOTE — Progress Notes (Signed)
Carelink Summary Report / Loop Recorder 

## 2016-05-20 DIAGNOSIS — R260 Ataxic gait: Secondary | ICD-10-CM | POA: Diagnosis not present

## 2016-05-21 DIAGNOSIS — F329 Major depressive disorder, single episode, unspecified: Secondary | ICD-10-CM | POA: Diagnosis not present

## 2016-05-21 DIAGNOSIS — R32 Unspecified urinary incontinence: Secondary | ICD-10-CM | POA: Diagnosis not present

## 2016-05-21 DIAGNOSIS — R2242 Localized swelling, mass and lump, left lower limb: Secondary | ICD-10-CM | POA: Diagnosis not present

## 2016-05-21 DIAGNOSIS — N200 Calculus of kidney: Secondary | ICD-10-CM | POA: Diagnosis not present

## 2016-05-21 DIAGNOSIS — I10 Essential (primary) hypertension: Secondary | ICD-10-CM | POA: Diagnosis not present

## 2016-05-22 ENCOUNTER — Encounter: Payer: Self-pay | Admitting: Cardiovascular Disease

## 2016-05-22 ENCOUNTER — Ambulatory Visit (INDEPENDENT_AMBULATORY_CARE_PROVIDER_SITE_OTHER): Payer: PPO | Admitting: Cardiovascular Disease

## 2016-05-22 VITALS — BP 110/82 | HR 82 | Ht 62.0 in | Wt 114.0 lb

## 2016-05-22 DIAGNOSIS — I639 Cerebral infarction, unspecified: Secondary | ICD-10-CM

## 2016-05-22 DIAGNOSIS — I10 Essential (primary) hypertension: Secondary | ICD-10-CM

## 2016-05-22 DIAGNOSIS — R260 Ataxic gait: Secondary | ICD-10-CM | POA: Diagnosis not present

## 2016-05-22 MED ORDER — AMLODIPINE BESYLATE 5 MG PO TABS: 5.0000 mg | ORAL_TABLET | Freq: Every day | ORAL | 11 refills | Status: DC

## 2016-05-22 NOTE — Patient Instructions (Signed)
Medication Instructions:  DECREASE Amlodipine to 5 mg daily   Labwork: None Ordered   Testing/Procedures: None Ordered   Follow-Up: Your physician wants you to follow-up in: 6 months with Dr. Nahser.  You will receive a reminder letter in the mail two months in advance. If you don't receive a letter, please call our office to schedule the follow-up appointment.   If you need a refill on your cardiac medications before your next appointment, please call your pharmacy.   Thank you for choosing [facility]! Darrin Apodaca, RN [phone]    

## 2016-05-22 NOTE — Progress Notes (Signed)
Office Visit    Patient Name: Stacey Ramirez Date of Encounter: 05/22/2016  Primary Care Provider:  Horatio Pel, MD Primary Cardiologist:  Vaughan Browner, MD  P. Izea Livolsi, MD   Chief Complaint    75 year old female with a history of palpitations, hypertension, hyperlipidemia, and recent stroke who presents for follow-up.  Past Medical History    Past Medical History:  Diagnosis Date  . Cerebrovascular accident (stroke) ([place])    a. 12/2015 - cryptogenic.  S/P MDT [place]q.  . Difficulty walking   . Essential hypertension   . History of dizziness   . Hypercholesterolemia   . Hypothyroidism   . Mitral regurgitation    a. 12/2015 Echo: EF 60-65%, mild focal basal hypertrophy. No rwma, triv AI, mild MR, mildly dil LA w/o thrombus. No RA thrombus. No PFO.  Marland Kitchen Palpitations   . Vitamin D deficiency    Past Surgical History:  Procedure Laterality Date  . CARDIOVASCULAR STRESS TEST  2002   NORMAL  . EP IMPLANTABLE DEVICE N/A 12/17/2015   Procedure: Loop Recorder Insertion;  Surgeon: [name], MD;  Location: [facility];  Service: Cardiovascular;  Laterality: N/A;  . TEE WITHOUT CARDIOVERSION N/A 12/17/2015   Procedure: TRANSESOPHAGEAL ECHOCARDIOGRAM (TEE);  Surgeon: Lelon Perla, MD;  Location: [facility] ENDOSCOPY;  Service: Cardiovascular;  Laterality: N/A;  Pt also needs a LOOP  . TRANSTHORACIC ECHOCARDIOGRAM  2008   SHOWED LEFT VENTRICULAR HYPERTROPHY AND MILD AORTIC STENOSIS    Allergies  Allergies  Allergen Reactions  . Sulfa Drugs Cross Reactors Itching  . Zocor [Simvastatin] Other (See Comments)    Feels like she is going to pass out, weakness  . Crestor [Rosuvastatin Calcium]     Muscle weakness  . Micardis [Telmisartan] Other (See Comments)    Feels like she is going to pass out    Notes from Ignacia Bayley, NP    75 year old female previously followed by Dr. Mare Ferrari for hypertension and palpitations. She recently was admitted for cryptogenic stroke  (right basal ganglia infarct), and underwent TEE revealing normal LV function without evidence of atrial thrombus. She subsequently underwent placement of an implantable loop recorder. She has had no events on this recorder up to this point. She has been recovering well from her stroke and can now walk, though with a limp. She is encouraged overall. Her blood pressure at home has been running in the 140s to 150s, and her husband has been checking it about 3 times per day. She has not been having any palpitations, chest pain, dyspnea, PND, orthopnea, dizziness, syncope, edema, or early satiety.  Oct. 20 ,2017: Stacey Ramirez is seen for the first time today.  Transfer from [place] . Has some leg / feet swelling  - likely due to the amlodipine .     Home Medications    Prior to Admission medications   Medication Sig Start Date End Date Taking? Authorizing Provider  ALPRAZolam (XANAX) 0.25 MG tablet Take 1 tablet (0.25 mg total) by mouth at bedtime. 12/25/15  Yes Ivan Anchors Love, PA-C  amLODipine (NORVASC) 10 MG tablet Take 1 tablet (10 mg total) by mouth 2 (two) times daily. Reported on 12/27/2015 01/23/16  Yes Rogelia Mire, NP  aspirin EC 325 MG EC tablet Take 1 tablet (325 mg total) by mouth daily. 12/17/15  Yes Lavina Hamman, MD  B Complex-C-Folic Acid (STRESS FORMULA) TABS Take 1 tablet by mouth daily.    Yes Historical Provider, MD  carboxymethylcellulose (REFRESH PLUS)  0.5 % SOLN Place 1 drop into both eyes 3 (three) times daily as needed.   Yes Historical Provider, MD  clopidogrel (PLAVIX) 75 MG tablet Take 1 tablet (75 mg total) by mouth daily. 12/24/15  Yes Ivan Anchors Love, PA-C  estradiol (ESTRACE) 0.1 MG/GM vaginal cream Place 2 g vaginally as needed (dryness).    Yes Historical Provider, MD  irbesartan (AVAPRO) 300 MG tablet Take 1 tablet (300 mg total) by mouth daily. 12/29/15  Yes Velvet Bathe, MD  levothyroxine (SYNTHROID, LEVOTHROID) 25 MCG tablet Take 25 mcg by mouth daily before breakfast.   09/21/14  Yes Historical Provider, MD  LOTEMAX 0.5 % ophthalmic suspension Place 1 drop into both eyes 4 (four) times daily as needed. For dry itchy eyes 02/17/13  Yes Historical Provider, MD  metoprolol tartrate (LOPRESSOR) 25 MG tablet Take 0.5 tablets (12.5 mg total) by mouth 2 (two) times daily. 12/24/15  Yes Ivan Anchors Love, PA-C  Multiple Vitamin (MULTIVITAMIN WITH MINERALS) TABS tablet Take 1 tablet by mouth daily.   Yes Historical Provider, MD  polyethylene glycol (MIRALAX / GLYCOLAX) packet Take 17 g by mouth daily. Patient taking differently: Take 17 g by mouth daily as needed for moderate constipation.  12/17/15  Yes Lavina Hamman, MD  sertraline (ZOLOFT) 50 MG tablet Take 50 mg by mouth daily.  01/02/16  Yes Historical Provider, MD    Review of Systems    Strength is been improving since her stroke. She continues to walk with a limp but overall is pleased with her progression. She denies chest pain, palpitations, PND, orthopnea, dizziness, syncope, edema, or early satiety.  All other systems reviewed and are otherwise negative except as noted above.  Physical Exam    VS:  BP 110/82   Pulse 82   Ht 5\' 2"  (1.575 m)   Wt 114 lb (51.7 kg)   BMI 20.85 kg/m  , BMI Body mass index is 20.85 kg/m. GEN: Well nourished, well developed, in no acute distress.  HEENT: normal.  Neck: Supple, no JVD, carotid bruits, or masses. Cardiac: RRR, no murmurs, rubs, or gallops. No clubbing, cyanosis, edema.  Radials/DP/PT 2+ and equal bilaterally.  Respiratory:  Respirations regular and unlabored, clear to auscultation bilaterally. GI: Soft, nontender, nondistended, BS + x 4. MS: no deformity or atrophy. Skin: warm and dry, no rash. Neuro:  Strength and sensation are intact. Psych: Normal affect.  Accessory Clinical Findings    None  Assessment & Plan    1.  Cryptogenic stroke: Status post implantable loop recorder. This is followed by our electrophysiology team. She has been recovering strength  and is now walking.  2. Essential hypertension: Blood pressure is okay today but has been Somewhat variable. We will decrease the amlodipine to 5 mg a day which should help with some of the leg swelling and also should help with some of her low blood pressure readings.  3. History of palpitations:   These are stable.     Mertie Moores, MD  05/22/2016 4:49 PM    [address],  [place], [place]  [id number] Pager [phone] Phone: [phone]; Fax: [phone]

## 2016-05-25 DIAGNOSIS — R260 Ataxic gait: Secondary | ICD-10-CM | POA: Diagnosis not present

## 2016-05-26 DIAGNOSIS — H6122 Impacted cerumen, left ear: Secondary | ICD-10-CM | POA: Diagnosis not present

## 2016-05-27 DIAGNOSIS — H2513 Age-related nuclear cataract, bilateral: Secondary | ICD-10-CM | POA: Diagnosis not present

## 2016-05-27 DIAGNOSIS — H5203 Hypermetropia, bilateral: Secondary | ICD-10-CM | POA: Diagnosis not present

## 2016-05-27 DIAGNOSIS — H524 Presbyopia: Secondary | ICD-10-CM | POA: Diagnosis not present

## 2016-05-27 DIAGNOSIS — R260 Ataxic gait: Secondary | ICD-10-CM | POA: Diagnosis not present

## 2016-05-27 DIAGNOSIS — H52203 Unspecified astigmatism, bilateral: Secondary | ICD-10-CM | POA: Diagnosis not present

## 2016-05-29 DIAGNOSIS — R260 Ataxic gait: Secondary | ICD-10-CM | POA: Diagnosis not present

## 2016-06-01 DIAGNOSIS — R260 Ataxic gait: Secondary | ICD-10-CM | POA: Diagnosis not present

## 2016-06-02 DIAGNOSIS — Z8673 Personal history of transient ischemic attack (TIA), and cerebral infarction without residual deficits: Secondary | ICD-10-CM | POA: Diagnosis not present

## 2016-06-02 DIAGNOSIS — H93292 Other abnormal auditory perceptions, left ear: Secondary | ICD-10-CM | POA: Diagnosis not present

## 2016-06-02 DIAGNOSIS — H6123 Impacted cerumen, bilateral: Secondary | ICD-10-CM | POA: Diagnosis not present

## 2016-06-03 DIAGNOSIS — R260 Ataxic gait: Secondary | ICD-10-CM | POA: Diagnosis not present

## 2016-06-05 DIAGNOSIS — E039 Hypothyroidism, unspecified: Secondary | ICD-10-CM | POA: Diagnosis not present

## 2016-06-05 DIAGNOSIS — Z7982 Long term (current) use of aspirin: Secondary | ICD-10-CM | POA: Diagnosis not present

## 2016-06-05 DIAGNOSIS — E7800 Pure hypercholesterolemia, unspecified: Secondary | ICD-10-CM | POA: Diagnosis not present

## 2016-06-05 DIAGNOSIS — I10 Essential (primary) hypertension: Secondary | ICD-10-CM | POA: Diagnosis not present

## 2016-06-05 DIAGNOSIS — M810 Age-related osteoporosis without current pathological fracture: Secondary | ICD-10-CM | POA: Diagnosis not present

## 2016-06-08 DIAGNOSIS — I10 Essential (primary) hypertension: Secondary | ICD-10-CM | POA: Diagnosis not present

## 2016-06-08 DIAGNOSIS — E7800 Pure hypercholesterolemia, unspecified: Secondary | ICD-10-CM | POA: Diagnosis not present

## 2016-06-08 DIAGNOSIS — R260 Ataxic gait: Secondary | ICD-10-CM | POA: Diagnosis not present

## 2016-06-08 DIAGNOSIS — Z0000 Encounter for general adult medical examination without abnormal findings: Secondary | ICD-10-CM | POA: Diagnosis not present

## 2016-06-10 DIAGNOSIS — R260 Ataxic gait: Secondary | ICD-10-CM | POA: Diagnosis not present

## 2016-06-12 DIAGNOSIS — E040 Nontoxic diffuse goiter: Secondary | ICD-10-CM | POA: Diagnosis not present

## 2016-06-12 DIAGNOSIS — I6930 Unspecified sequelae of cerebral infarction: Secondary | ICD-10-CM | POA: Diagnosis not present

## 2016-06-12 DIAGNOSIS — Z0001 Encounter for general adult medical examination with abnormal findings: Secondary | ICD-10-CM | POA: Diagnosis not present

## 2016-06-12 DIAGNOSIS — M810 Age-related osteoporosis without current pathological fracture: Secondary | ICD-10-CM | POA: Diagnosis not present

## 2016-06-15 ENCOUNTER — Ambulatory Visit (INDEPENDENT_AMBULATORY_CARE_PROVIDER_SITE_OTHER): Payer: PPO | Admitting: *Deleted

## 2016-06-15 DIAGNOSIS — I639 Cerebral infarction, unspecified: Secondary | ICD-10-CM

## 2016-06-15 LAB — CUP PACEART REMOTE DEVICE CHECK
MDC IDC PG IMPLANT DT: 20170516
MDC IDC SESS DTM: 20171013204013

## 2016-06-15 NOTE — Progress Notes (Signed)
Carelink Summary Report / Loop Recorder 

## 2016-06-15 NOTE — Progress Notes (Signed)
Carelink summary report received. Battery status OK. Normal device function. No new symptom episodes, tachy episodes, brady, or pause episodes. 2 AF 0% ECGs previously reviewed.  Monthly summary reports and ROV/PRN

## 2016-06-23 DIAGNOSIS — E7800 Pure hypercholesterolemia, unspecified: Secondary | ICD-10-CM | POA: Diagnosis not present

## 2016-06-29 ENCOUNTER — Ambulatory Visit (INDEPENDENT_AMBULATORY_CARE_PROVIDER_SITE_OTHER): Payer: PPO | Admitting: Neurology

## 2016-06-29 ENCOUNTER — Encounter: Payer: Self-pay | Admitting: Neurology

## 2016-06-29 VITALS — BP 164/85 | HR 85 | Wt 116.2 lb

## 2016-06-29 DIAGNOSIS — G214 Vascular parkinsonism: Secondary | ICD-10-CM | POA: Insufficient documentation

## 2016-06-29 DIAGNOSIS — I10 Essential (primary) hypertension: Secondary | ICD-10-CM | POA: Diagnosis not present

## 2016-06-29 DIAGNOSIS — Z8673 Personal history of transient ischemic attack (TIA), and cerebral infarction without residual deficits: Secondary | ICD-10-CM

## 2016-06-29 DIAGNOSIS — E785 Hyperlipidemia, unspecified: Secondary | ICD-10-CM

## 2016-06-29 DIAGNOSIS — I6381 Other cerebral infarction due to occlusion or stenosis of small artery: Secondary | ICD-10-CM

## 2016-06-29 DIAGNOSIS — I639 Cerebral infarction, unspecified: Secondary | ICD-10-CM | POA: Diagnosis not present

## 2016-06-29 HISTORY — DX: Vascular parkinsonism: G21.4

## 2016-06-29 NOTE — Patient Instructions (Addendum)
-   continue plavix and repatha for stroke prevention - continue PT/OT and gradually increase work load and regular exercise.  - Follow up with your primary care physician for stroke risk factor modification. Recommend maintain blood pressure goal <130/80, diabetes with hemoglobin A1c goal below 7.0% and lipids with LDL cholesterol goal below 70 mg/dL.  - check BP at home and record. Recommend to space out BP meds to avoid abrupt BP drop in the morning. May consider irbesartan in the morning and amlodipine in the evening.  - continue loop recorder monitoring and so far no afib - follow up in 6 months.

## 2016-06-29 NOTE — Progress Notes (Addendum)
STROKE NEUROLOGY FOLLOW UP NOTE  NAME: Emonni Ranta Gambale DOB: 1941/01/09  REASON FOR VISIT: stroke follow up HISTORY FROM: pt and husband and chart  Today we had the pleasure of seeing ACURA DISALVO in follow-up at our Neurology Clinic. Pt was accompanied by husband.   History Summary Ms. GERALDINE ALLAIN is a 75 y.o. female with history of hypertension, hyperlipidemia, hypothyroidism, aortic stenosis, mitral regurgitation, and previous stroke admitted on 12/13/15 for left hemiparesis. MRI showed acute moderate large infarct in the right BG as well as a punctate infarct periventricular WM adjacent to right occipital horn, more embolic pattern. MRA showed moderate stenosis of the supraclinoid right ICA, right P1 and left P2. CUS, TTE, DVT screening, TEE all negative. A1C 5.7 and LDL 238. Loop recorder placed. She was discharged to CIR with DAPT and crestor.   Follow up 02/27/16 - the patient has been doing well. She stayed in CIR for one week and discharged home with home PT/OT and now on outpt PT/OT. She still not able to tolerate statin and was put now on praluent. On 12/27/15 she was admitted for HA and muscle weakness. She was treated for high BP and crestor discontinued on discharge. She went back to ER on 01/01/16 due to generalized weakness, SOB and near fall. CT head showed no acute changes. Felt related to stress and anxiety. Since then, pt continue to feel easy fatigue, generalized weakness and depressed mood. She was put on zoloft. She continues to work with PT/OT and currently gradually recovering. BP today 140/78. Loop recorder so far no afib.  Interval History During the interval time, pt has been doing well. She still has outpt PT/OT 2/week. She stated that she felt her strength and balance did not improve over time. She did not do too much home exercise though. Her BP at home 130-150/75-85 in the morning. However, after BP meds, her BP drops, sometimes significantly. Pt felt that could be one of  the reasons that she felt tired during exercise. Her BP today 164/85 in clinic. She is on plavix. She was also on praluent but LDL not in good control, and now she is on repatha. Loop recorder no afib so far.  REVIEW OF SYSTEMS: Full 14 system review of systems performed and notable only for those listed below and in HPI above, all others are negative:  Constitutional:   Cardiovascular: murmur Ear/Nose/Throat:  Runny nose, drooling Skin: itching Eyes:   Respiratory:   Gastroitestinal:   Genitourinary: incontinence of bladder Hematology/Lymphatic:  Bruise/bleed easily Endocrine:  Musculoskeletal:   Allergy/Immunology:   Neurological:  weakness Psychiatric: confusion Sleep:   The following represents the patient's updated allergies and side effects list: Allergies  Allergen Reactions  . Sulfa Drugs Cross Reactors Itching  . Zocor [Simvastatin] Other (See Comments)    Feels like she is going to pass out, weakness  . Crestor [Rosuvastatin Calcium]     Muscle weakness  . Statins Other (See Comments)    Unable to walk or stand.  . Micardis [Telmisartan] Other (See Comments)    Feels like she is going to pass out    The neurologically relevant items on the patient's problem list were reviewed on today's visit.  Neurologic Examination  A problem focused neurological exam (12 or more points of the single system neurologic examination, vital signs counts as 1 point, cranial nerves count for 8 points) was performed.  Blood pressure (!) 164/85, pulse 85, weight 116 lb 3.2 oz (52.7 kg).  General - Well nourished, well developed, in no apparent distress.  Ophthalmologic - Sharp disc margins OU.   Cardiovascular - Regular rate and rhythm with no murmur.  Mental Status -  Level of arousal and orientation to time, place, and person were intact. Language including expression, naming, repetition, comprehension was assessed and found intact. Fund of Knowledge was assessed and was  intact.  Cranial Nerves II - XII - II - Visual field intact OU. III, IV, VI - Extraocular movements intact. V - Facial sensation intact bilaterally. VII - Facial movement intact bilaterally. VIII - Hearing & vestibular intact bilaterally. X - Palate elevates symmetrically. XI - Chin turning & shoulder shrug intact bilaterally. XII - Tongue protrusion intact.  Motor Strength - The patient's strength was normal in all extremities and pronator drift was absent.  Bulk was normal and fasciculations were absent.   Motor Tone - Muscle tone was assessed at the neck and appendages and was normal.  Reflexes - The patient's reflexes were 1+ in all extremities and she had no pathological reflexes.  Sensory - Light touch, temperature/pinprick, vibration and proprioception, and Romberg testing were assessed and were normal.    Coordination - The patient had normal movements in the hands and feet with no ataxia or dysmetria.  Tremor was absent.  Gait and Station - able to stand up from chair without assistance, however, she does have brief difficulty with initiation of gait, then on walking she does have mild shuffling. No stooped posturing and no decreased arm swing though.    Data reviewed: I personally reviewed the images and agree with the radiology interpretations.  Ct Head Wo Contrast 12/13/2015   No acute abnormality. Atrophy and chronic microvascular ischemic change.   Mri and Mra Head/brain Wo Cm 12/13/2015   1. Confluent acute infarct in the right basal ganglia with no associated hemorrhage or mass effect. Superimposed punctate posterior right MCA periventricular white matter infarct.  2. Negative for emergent large vessel occlusion. Anterior circulation atherosclerosis appears stable since 2014, including at least moderate stenosis of the supraclinoid right ICA (series 705, image 6).  3. Mild progression of posterior circulation atherosclerosis since 2014 including new mild stenosis of  the mid basilar artery and moderate stenosis of the right PCA P1 segment.  4. Underlying chronic small vessel ischemia, moderate for age.   LE venous doppler - Lower extremity venous duplex has been completed. Preliminary findings: No evidence of DVT or baker's cyst.  CUS - Bilateral: 1-39% ICA stenosis. Vertebral artery flow is antegrade.  TTE - Left ventricle: The cavity size was normal. Wall thickness was  increased in a pattern of mild LVH. Systolic function was normal.  The estimated ejection fraction was in the range of 60% to 65%.  Wall motion was normal; there were no regional wall motion  abnormalities. Doppler parameters are consistent with abnormal  left ventricular relaxation (grade 1 diastolic dysfunction). - Aortic valve: There was no stenosis. - Mitral valve: There was no significant regurgitation. - Right ventricle: The cavity size was normal. Systolic function  was normal. - Pulmonary arteries: No complete TR doppler jet so unable to  estimate PA systolic pressure. - Inferior vena cava: The vessel was normal in size. The  respirophasic diameter changes were in the normal range (>= 50%),  consistent with normal central venous pressure. Impressions: - Normal LV size with mild LV hypertrophy. EF 60-65%. Normal RV  size and systolic function. No significant valvular  abnormalities.  TEE - normal LV  function; negative saline microcavitation study  CT head 12/28/15 1. Expected evolution of recent right basal ganglia infarct. No hemorrhagic transformation, increased size or mass effect. 2. Background atrophy and chronic small vessel ischemia, unchanged. No new abnormality is seen.  CT head 01/01/16 1. The known right basal ganglia and right periventricular white matter infarct shown on 12/13/2015 is surprisingly indistinct on today's exam, with only very vague heterogeneity in this vicinity. No hemorrhagic transformation or acute complicating feature. 2.  Old lacunar infarct of the left anterior thalamus. Chronic microvascular white matter disease. No new significant abnormality observed.  Component     Latest Ref Rng & Units 12/13/2015  Cholesterol     0 - 200 mg/dL 315 (H)  Triglycerides     <150 mg/dL 59  HDL Cholesterol     >40 mg/dL 65  Total CHOL/HDL Ratio     RATIO 4.8  VLDL     0 - 40 mg/dL 12  LDL (calc)     0 - 99 mg/dL 238 (H)  Hemoglobin A1C     4.8 - 5.6 % 5.7 (H)  Mean Plasma Glucose     mg/dL 117  TSH     0.350 - 4.500 uIU/mL 0.911    Assessment: As you may recall, she is a 75 y.o. Caucasian female with PMH of HTN, HLD, hypothyroidism, aortic stenosis, mitral regurgitation, and previous stroke admitted on 12/13/15 for acute moderate large infarct in the right BG as well as a punctate infarct periventricular WM adjacent to right occipital horn. MRA showed severe stenosis of the supraclinoid right ICA, as well as right P1 and left P2. CUS, TTE, DVT screening, TEE all negative. A1C 5.7 and LDL 238. Loop recorder placed. She was discharged to CIR with DAPT and crestor. Pt had previous stroke in 04/2013 with small left BG infarct as well as remote left cerebellar infarct. MRA at that time also showed right ICA supraclinoid segment severe stenosis. Therefore, the current stroke is more likely due to large vessel disease. Not RESPECT ESUS candidate due to > 50% corresponding large artery stenosis prior to index stroke. So far loop recorder no afib. Pt had dramatic recovery and currently no focal deficit. However, she had post stroke fatigue and depression. On zoloft. Was put on praluent for HLD treatment due to statin intolerance, however, LDL not in control as per pt and now she is on repatha. Still has outpt PT/OT, but not much improvement over time. Encourage more home exercise. However, on exam she had mild parkinsonian symptoms, may consider early phase of vascular parkinsonism.   Plan:  - continue plavix and repatha for stroke  prevention - continue PT/OT and gradually increase exercise.  - Follow up with your primary care physician for stroke risk factor modification. Recommend maintain blood pressure goal <130/80, diabetes with hemoglobin A1c goal below 7.0% and lipids with LDL cholesterol goal below 70 mg/dL.  - check BP at home and record. Recommend to space out BP meds to avoid abrupt BP drop in the morning. May consider irbesartan in the morning and amlodipine in the evening.  - continue loop recorder monitoring and so far no afib - gait concerning for early vascular parkinson, will continue to closely observe. - follow up in 6 months.  I spent more than 25 minutes of face to face time with the patient. Greater than 50% of time was spent in counseling and coordination of care. We discussed about post stroke fatigue and depression, increase exercise, BP management.  No orders of the defined types were placed in this encounter.   Meds ordered this encounter  Medications  . DISCONTD: amLODipine (NORVASC) 2.5 MG tablet  . DISCONTD: amLODipine (NORVASC) 5 MG tablet  . DISCONTD: clopidogrel (PLAVIX) 75 MG tablet    Sig: Take 75 mg by mouth.  . DISCONTD: docusate sodium (COLACE) 50 MG capsule    Sig: Take by mouth.  . DISCONTD: irbesartan (AVAPRO) 300 MG tablet    Sig: Take 300 mg by mouth.  . DISCONTD: levothyroxine (SYNTHROID, LEVOTHROID) 25 MCG tablet    Sig: Take by mouth.  . Alirocumab 150 MG/ML SOPN    Sig: Inject into the skin.  Marland Kitchen sertraline (ZOLOFT) 50 MG tablet    Sig: Take 50 mg by mouth.  . loratadine (CLARITIN) 10 MG tablet    Sig: Take 10 mg by mouth.  . DISCONTD: tamsulosin (FLOMAX) 0.4 MG CAPS capsule  . ondansetron (ZOFRAN-ODT) 4 MG disintegrating tablet  . Evolocumab (REPATHA [place])    Sig: Inject 430 mg into the skin.    Patient Instructions  - continue plavix and repatha for stroke prevention - continue PT/OT and gradually increase work load and regular exercise.  - Follow up with  your primary care physician for stroke risk factor modification. Recommend maintain blood pressure goal <130/80, diabetes with hemoglobin A1c goal below 7.0% and lipids with LDL cholesterol goal below 70 mg/dL.  - check BP at home and record. Recommend to space out BP meds to avoid abrupt BP drop in the morning. May consider irbesartan in the morning and amlodipine in the evening.  - continue loop recorder monitoring and so far no afib - follow up in 6 months.   Rosalin Hawking, MD PhD [facility] [address], [place], [place] [address] [phone]

## 2016-06-30 DIAGNOSIS — R260 Ataxic gait: Secondary | ICD-10-CM | POA: Diagnosis not present

## 2016-07-06 DIAGNOSIS — R260 Ataxic gait: Secondary | ICD-10-CM | POA: Diagnosis not present

## 2016-07-10 DIAGNOSIS — R260 Ataxic gait: Secondary | ICD-10-CM | POA: Diagnosis not present

## 2016-07-13 DIAGNOSIS — R260 Ataxic gait: Secondary | ICD-10-CM | POA: Diagnosis not present

## 2016-07-14 ENCOUNTER — Ambulatory Visit (INDEPENDENT_AMBULATORY_CARE_PROVIDER_SITE_OTHER): Payer: PPO | Admitting: *Deleted

## 2016-07-14 DIAGNOSIS — I639 Cerebral infarction, unspecified: Secondary | ICD-10-CM

## 2016-07-15 DIAGNOSIS — R260 Ataxic gait: Secondary | ICD-10-CM | POA: Diagnosis not present

## 2016-07-15 NOTE — Progress Notes (Signed)
Carelink Summary Report / Loop Recorder 

## 2016-07-17 DIAGNOSIS — R260 Ataxic gait: Secondary | ICD-10-CM | POA: Diagnosis not present

## 2016-07-20 DIAGNOSIS — R260 Ataxic gait: Secondary | ICD-10-CM | POA: Diagnosis not present

## 2016-07-29 LAB — CUP PACEART REMOTE DEVICE CHECK
MDC IDC PG IMPLANT DT: 20170516
MDC IDC SESS DTM: 20171112214223

## 2016-08-10 DIAGNOSIS — R260 Ataxic gait: Secondary | ICD-10-CM | POA: Diagnosis not present

## 2016-08-12 DIAGNOSIS — R260 Ataxic gait: Secondary | ICD-10-CM | POA: Diagnosis not present

## 2016-08-13 ENCOUNTER — Ambulatory Visit (INDEPENDENT_AMBULATORY_CARE_PROVIDER_SITE_OTHER): Payer: PPO | Admitting: *Deleted

## 2016-08-13 DIAGNOSIS — I639 Cerebral infarction, unspecified: Secondary | ICD-10-CM | POA: Diagnosis not present

## 2016-08-14 NOTE — Progress Notes (Signed)
Carelink Summary Report / Loop Recorder 

## 2016-08-18 DIAGNOSIS — I639 Cerebral infarction, unspecified: Secondary | ICD-10-CM | POA: Diagnosis not present

## 2016-08-18 DIAGNOSIS — E559 Vitamin D deficiency, unspecified: Secondary | ICD-10-CM | POA: Diagnosis not present

## 2016-08-24 DIAGNOSIS — E785 Hyperlipidemia, unspecified: Secondary | ICD-10-CM | POA: Diagnosis not present

## 2016-09-03 LAB — CUP PACEART REMOTE DEVICE CHECK
Implantable Pulse Generator Implant Date: 20170516
MDC IDC SESS DTM: 20171212223818

## 2016-09-03 NOTE — Progress Notes (Signed)
Carelink summary report received. Battery status OK. Normal device function. No new symptom episodes, tachy episodes, brady, or pause episodes. 2 AF- 1 w/ ECG SR w/ PVCs. 1 w. ECG appears SR w/ PVCs/PACs/burst PAT. Monthly summary reports and ROV/PRN

## 2016-09-14 ENCOUNTER — Ambulatory Visit (INDEPENDENT_AMBULATORY_CARE_PROVIDER_SITE_OTHER): Payer: PPO | Admitting: *Deleted

## 2016-09-14 DIAGNOSIS — I639 Cerebral infarction, unspecified: Secondary | ICD-10-CM | POA: Diagnosis not present

## 2016-09-14 NOTE — Progress Notes (Signed)
Carelink Summary Report / Loop Recorder 

## 2016-09-15 DIAGNOSIS — I10 Essential (primary) hypertension: Secondary | ICD-10-CM | POA: Diagnosis not present

## 2016-09-15 DIAGNOSIS — I499 Cardiac arrhythmia, unspecified: Secondary | ICD-10-CM | POA: Diagnosis not present

## 2016-09-15 DIAGNOSIS — Z23 Encounter for immunization: Secondary | ICD-10-CM | POA: Diagnosis not present

## 2016-09-15 DIAGNOSIS — E7800 Pure hypercholesterolemia, unspecified: Secondary | ICD-10-CM | POA: Diagnosis not present

## 2016-09-22 ENCOUNTER — Telehealth: Payer: Self-pay | Admitting: Neurology

## 2016-09-22 NOTE — Telephone Encounter (Signed)
Patient Stacey Ramirez husband called office.  Patient scheduled to see Dr. Erlinda Hong in May for 6 month fu and he is requesting her to be seen sooner for trouble walking/weakness is it okay to try to schedule her sooner.  Please call

## 2016-09-22 NOTE — Telephone Encounter (Signed)
Rn call patients husband that Dr. Erlinda Hong had a cancellation tomorrow at 1100. Rn schedule pt for her to check in at 10:30am. Pts husband verified address and will bring his wife tomorrow for weakness and walking ability for appt.

## 2016-09-23 ENCOUNTER — Ambulatory Visit (INDEPENDENT_AMBULATORY_CARE_PROVIDER_SITE_OTHER): Payer: PPO | Admitting: Neurology

## 2016-09-23 ENCOUNTER — Encounter: Payer: Self-pay | Admitting: Neurology

## 2016-09-23 VITALS — BP 146/78 | HR 80 | Wt 116.0 lb

## 2016-09-23 DIAGNOSIS — I10 Essential (primary) hypertension: Secondary | ICD-10-CM

## 2016-09-23 DIAGNOSIS — R262 Difficulty in walking, not elsewhere classified: Secondary | ICD-10-CM

## 2016-09-23 DIAGNOSIS — I63231 Cerebral infarction due to unspecified occlusion or stenosis of right carotid arteries: Secondary | ICD-10-CM | POA: Diagnosis not present

## 2016-09-23 DIAGNOSIS — E785 Hyperlipidemia, unspecified: Secondary | ICD-10-CM | POA: Diagnosis not present

## 2016-09-23 LAB — CUP PACEART REMOTE DEVICE CHECK
Implantable Pulse Generator Implant Date: 20170516
MDC IDC SESS DTM: 20180111230717

## 2016-09-23 NOTE — Patient Instructions (Signed)
-   continue plavix for stroke prevention - will do nerve conduction study to rule out any muscle disease.  - relaxation and de-stress are still the key to improve. Build up confidence of your walking.  - Follow up with your primary care physician for stroke risk factor modification. Recommend maintain blood pressure goal <130/80, diabetes with hemoglobin A1c goal below 7.0% and lipids with LDL cholesterol goal below 70 mg/dL.  - check BP at home and record. - continue loop recorder monitoring and so far no afib - follow up in 6 months.

## 2016-09-23 NOTE — Progress Notes (Signed)
STROKE NEUROLOGY FOLLOW UP NOTE  NAME: Stacey Ramirez DOB: 04-Sep-1940  REASON FOR VISIT: stroke follow up HISTORY FROM: pt and husband and chart  Today we had the pleasure of seeing Stacey Ramirez in follow-up at our Neurology Clinic. Pt was accompanied by husband.   History Summary Ms. Stacey Ramirez is a 76 y.o. female with history of hypertension, hyperlipidemia, hypothyroidism, aortic stenosis, mitral regurgitation, and previous stroke admitted on 12/13/15 for left hemiparesis. MRI showed acute moderate large infarct in the right BG as well as a punctate infarct periventricular WM adjacent to right occipital horn, more embolic pattern. MRA showed moderate stenosis of the supraclinoid right ICA, right P1 and left P2. CUS, TTE, DVT screening, TEE all negative. A1C 5.7 and LDL 238. Loop recorder placed. She was discharged to CIR with DAPT and crestor.   Follow up 02/27/16 - the patient has been doing well. She stayed in CIR for one week and discharged home with home PT/OT and now on outpt PT/OT. She still not able to tolerate statin and was put now on praluent. On 12/27/15 she was admitted for HA and muscle weakness. She was treated for high BP and crestor discontinued on discharge. She went back to ER on 01/01/16 due to generalized weakness, SOB and near fall. CT head showed no acute changes. Felt related to stress and anxiety. Since then, pt continue to feel easy fatigue, generalized weakness and depressed mood. She was put on zoloft. She continues to work with PT/OT and currently gradually recovering. BP today 140/78. Loop recorder so far no afib.  Follow up 06/29/16 - pt has been doing well. She still has outpt PT/OT 2/week. She stated that she felt her strength and balance did not improve over time. She did not do too much home exercise though. Her BP at home 130-150/75-85 in the morning. However, after BP meds, her BP drops, sometimes significantly. Pt felt that could be one of the reasons that she  felt tired during exercise. Her BP today 164/85 in clinic. She is on plavix. She was also on praluent but LDL not in good control, and now she is on repatha. Loop recorder no afib so far.  Interval History During the interval time, pt stated that she was on praluent for 5-6 months and repatha for 2 months, but her LDL still high and not coming down. Also due to mild weakness side effect, those medications were discontinued. She was put on Livalo, but still not able to tolerate due to bilateral leg weakness. For the last 2 months, she felt continued leg weakness, not able to walk well, also felt lightheadedness. However, her PT/OT was considered well and she was discharged from rehab services. In clinic, she had likely cautious gait and psychogenic gait, most consistent with anxiety and depression. BP 140/78.  REVIEW OF SYSTEMS: Full 14 system review of systems performed and notable only for those listed below and in HPI above, all others are negative:  Constitutional:   Cardiovascular:  Ear/Nose/Throat:   Skin: itching Eyes:  Eye itching, eye redness Respiratory:   Gastroitestinal:   Genitourinary: incontinence of bladder Hematology/Lymphatic:  Bruise/bleed easily Endocrine:  Musculoskeletal:  Walking difficulty Allergy/Immunology:   Neurological:  dizziness Psychiatric:  Sleep:   The following represents the patient's updated allergies and side effects list: Allergies  Allergen Reactions  . Sulfa Drugs Cross Reactors Itching  . Zocor [Simvastatin] Other (See Comments)    Feels like she is going to pass out, weakness  .  Crestor [Rosuvastatin Calcium]     Muscle weakness  . Statins Other (See Comments)    Unable to walk or stand.  . Micardis [Telmisartan] Other (See Comments)    Feels like she is going to pass out    The neurologically relevant items on the patient's problem list were reviewed on today's visit.  Neurologic Examination  A problem focused neurological exam (12 or  more points of the single system neurologic examination, vital signs counts as 1 point, cranial nerves count for 8 points) was performed.  Blood pressure (!) 146/78, pulse 80, weight 116 lb (52.6 kg).  General - Well nourished, well developed, in no apparent distress.  Ophthalmologic - Sharp disc margins OU.   Cardiovascular - Regular rate and rhythm with no murmur.  Mental Status -  Level of arousal and orientation to time, place, and person were intact. Language including expression, naming, repetition, comprehension was assessed and found intact. Fund of Knowledge was assessed and was intact.  Cranial Nerves II - XII - II - Visual field intact OU. III, IV, VI - Extraocular movements intact. V - Facial sensation intact bilaterally. VII - Facial movement intact bilaterally. VIII - Hearing & vestibular intact bilaterally. X - Palate elevates symmetrically. XI - Chin turning & shoulder shrug intact bilaterally. XII - Tongue protrusion intact.  Motor Strength - The patient's strength was normal in all extremities and pronator drift was absent.  Bulk was normal and fasciculations were absent.   Motor Tone - Muscle tone was assessed at the neck and appendages and was normal, no rigidity.  Reflexes - The patient's reflexes were 1+ in all extremities and she had no pathological reflexes.  Sensory - Light touch, temperature/pinprick, vibration and proprioception, and Romberg testing were assessed and were normal.    Coordination - The patient had normal movements in the hands and feet with no ataxia or dysmetria.  Tremor was absent. No cog-wheeling.   Gait and Station - stand up from chair without difficulty, however, very cautious gait, trying to hold onto things, frequent stop, no gait initiation difficulty, no shuffling gait, slow turns. Walking normally if holding husband arm.    Data reviewed: I personally reviewed the images and agree with the radiology interpretations.  Ct  Head Wo Contrast 12/13/2015   No acute abnormality. Atrophy and chronic microvascular ischemic change.   Mri and Mra Head/brain Wo Cm 12/13/2015   1. Confluent acute infarct in the right basal ganglia with no associated hemorrhage or mass effect. Superimposed punctate posterior right MCA periventricular white matter infarct.  2. Negative for emergent large vessel occlusion. Anterior circulation atherosclerosis appears stable since 2014, including at least moderate stenosis of the supraclinoid right ICA (series 705, image 6).  3. Mild progression of posterior circulation atherosclerosis since 2014 including new mild stenosis of the mid basilar artery and moderate stenosis of the right PCA P1 segment.  4. Underlying chronic small vessel ischemia, moderate for age.   LE venous doppler - Lower extremity venous duplex has been completed. Preliminary findings: No evidence of DVT or baker's cyst.  CUS - Bilateral: 1-39% ICA stenosis. Vertebral artery flow is antegrade.  TTE - Left ventricle: The cavity size was normal. Wall thickness was  increased in a pattern of mild LVH. Systolic function was normal.  The estimated ejection fraction was in the range of 60% to 65%.  Wall motion was normal; there were no regional wall motion  abnormalities. Doppler parameters are consistent with abnormal  left ventricular  relaxation (Ramirez 1 diastolic dysfunction). - Aortic valve: There was no stenosis. - Mitral valve: There was no significant regurgitation. - Right ventricle: The cavity size was normal. Systolic function  was normal. - Pulmonary arteries: No complete TR doppler jet so unable to  estimate PA systolic pressure. - Inferior vena cava: The vessel was normal in size. The  respirophasic diameter changes were in the normal range (>= 50%),  consistent with normal central venous pressure. Impressions: - Normal LV size with mild LV hypertrophy. EF 60-65%. Normal RV  size and systolic  function. No significant valvular  abnormalities.  TEE - normal LV function; negative saline microcavitation study  CT head 12/28/15 1. Expected evolution of recent right basal ganglia infarct. No hemorrhagic transformation, increased size or mass effect. 2. Background atrophy and chronic small vessel ischemia, unchanged. No new abnormality is seen.  CT head 01/01/16 1. The known right basal ganglia and right periventricular white matter infarct shown on 12/13/2015 is surprisingly indistinct on today's exam, with only very vague heterogeneity in this vicinity. No hemorrhagic transformation or acute complicating feature. 2. Old lacunar infarct of the left anterior thalamus. Chronic microvascular white matter disease. No new significant abnormality observed.  Component     Latest Ref Rng & Units 12/13/2015  Cholesterol     0 - 200 mg/dL 315 (H)  Triglycerides     <150 mg/dL 59  HDL Cholesterol     >40 mg/dL 65  Total CHOL/HDL Ratio     RATIO 4.8  VLDL     0 - 40 mg/dL 12  LDL (calc)     0 - 99 mg/dL 238 (H)  Hemoglobin A1C     4.8 - 5.6 % 5.7 (H)  Mean Plasma Glucose     mg/dL 117  TSH     0.350 - 4.500 uIU/mL 0.911    Assessment: As you may recall, she is a 76 y.o. Caucasian female with PMH of HTN, HLD, hypothyroidism, aortic stenosis, mitral regurgitation, and previous stroke admitted on 12/13/15 for acute moderate large infarct in the right BG as well as a punctate infarct periventricular WM adjacent to right occipital horn. MRA showed severe stenosis of the supraclinoid right ICA, as well as right P1 and left P2. CUS, TTE, DVT screening, TEE all negative. A1C 5.7 and LDL 238. Loop recorder placed. She was discharged to CIR with DAPT and crestor. Pt had previous stroke in 04/2013 with small left BG infarct as well as remote left cerebellar infarct. MRA at that time also showed right ICA supraclinoid segment severe stenosis. Therefore, the current stroke is more likely due to large  vessel disease. Not RESPECT ESUS candidate due to > 50% corresponding large artery stenosis prior to index stroke. So far loop recorder no afib. Pt had dramatic recovery and currently no focal deficit. However, she had post stroke fatigue and depression. On zoloft. Was put on praluent and then repatha for HLD treatment due to statin intolerance, however, LDL still not in control. Not able to tolerate Livalo. However, complains of walking difficulty, dizziness and leg weakness, but has graduated from outpt PT/OT. On exam, she likely had cautious gait and psychogenic gait, no parkinsonian signs. But will do EMG/NCS to rule out any muscular cause.  Plan:  - continue plavix for stroke prevention - EMG/NCS to rule out any muscle disease.  - relaxation and de-stress are still the key to improve. Build up confidence on walking.  - Follow up with your primary care physician  for stroke risk factor modification. Recommend maintain blood pressure goal <130/80, diabetes with hemoglobin A1c goal below 7.0% and lipids with LDL cholesterol goal below 70 mg/dL.  - check BP at home and record. - continue loop recorder monitoring and so far no afib - follow up on 12/29/16.  I spent more than 25 minutes of face to face time with the patient. Greater than 50% of time was spent in counseling and coordination of care. We discussed about post stroke fatigue and depression, ddx for walking difficulty, and further EMG testing.    Orders Placed This Encounter  Procedures  . NCV with EMG(electromyography)    Standing Status:   Future    Standing Expiration Date:   09/23/2017    No orders of the defined types were placed in this encounter.   Patient Instructions  - continue plavix for stroke prevention - will do nerve conduction study to rule out any muscle disease.  - relaxation and de-stress are still the key to improve. Build up confidence of your walking.  - Follow up with your primary care physician for stroke  risk factor modification. Recommend maintain blood pressure goal <130/80, diabetes with hemoglobin A1c goal below 7.0% and lipids with LDL cholesterol goal below 70 mg/dL.  - check BP at home and record. - continue loop recorder monitoring and so far no afib - follow up in 6 months.   Rosalin Hawking, MD PhD [facility] [address], [place], [place] [address] [phone]

## 2016-09-24 ENCOUNTER — Ambulatory Visit (INDEPENDENT_AMBULATORY_CARE_PROVIDER_SITE_OTHER): Payer: PPO | Admitting: Diagnostic Neuroimaging

## 2016-09-24 ENCOUNTER — Encounter (INDEPENDENT_AMBULATORY_CARE_PROVIDER_SITE_OTHER): Payer: Self-pay

## 2016-09-24 DIAGNOSIS — R262 Difficulty in walking, not elsewhere classified: Secondary | ICD-10-CM | POA: Diagnosis not present

## 2016-09-24 DIAGNOSIS — Z0289 Encounter for other administrative examinations: Secondary | ICD-10-CM

## 2016-09-25 NOTE — Progress Notes (Signed)
[facility]  NCS (NERVE CONDUCTION STUDY) WITH EMG (ELECTROMYOGRAPHY) REPORT    STUDY DATE: 09/24/16 PATIENT NAME: Stacey Ramirez DOB: 24-Sep-1940 MRN: [id number]  ORDERING CLINICIAN: Rosalin Hawking, MD PhD   TECHNOLOGIST: Oneita Jolly ELECTROMYOGRAPHER: Earlean Polka. Marji Kuehnel, MD  CLINICAL INFORMATION: 76 year old female with intermittent weakness.  FINDINGS: NERVE CONDUCTION STUDY: Bilateral peroneal motor responses have normal distal latencies, decreased amplitudes, normal conduction velocities.  Bilateral tibial motor responses and F wave latencies are normal.  Bilateral sural and right superficial peroneal sensory responses are normal.  Left superficial peroneal sensory response has normal peak latency and borderline decreased amplitude.    NEEDLE ELECTROMYOGRAPHY: Needle examination of right upper extremity iliopsoas, Louise medius, vastus medialis, tibials anterior, gastrocnemius and right lumbar paraspinal muscles L5-S1 is normal.   IMPRESSION:  Mildly abnormal study demonstrate: 1. Mild axonal sensory motor polyneuropathy. 2. No electrodiagnostic evidence of underlying myopathy.     INTERPRETING PHYSICIAN:  Penni Bombard, MD Certified in Neurology, Neurophysiology and Neuroimaging  [facility] [address], [place], [place] [address] [phone]    [facility]    Nerve / Sites Rec. Site Peak Lat Ref.  Amp Ref. Segments Distance    ms ms V V  cm  R Sural - Ankle (Calf)     Calf Ankle 3.6 ?4.4 6 ?6 Calf - Ankle 14  L Sural - Ankle (Calf)     Calf Ankle 3.6 ?4.4 5 ?6 Calf - Ankle 14  L Superficial peroneal - Ankle     Lat leg Ankle 3.7 ?4.4 4 ?6 Lat leg - Ankle 14  R Superficial peroneal - Ankle     Lat leg Ankle 3.7 ?4.4 5 ?6 Lat leg - Ankle 14             [facility]NC    Nerve / Sites Muscle Latency Ref. Amplitude Ref. Rel Amp Segments Distance Lat Diff Velocity Ref. Area    ms ms mV mV %  cm ms  m/s m/s mVms  R Peroneal - EDB     Ankle EDB 6.4 ?6.5 0.6 ?2.0 100 Ankle - EDB 9    1.8     Fib head EDB 11.7  0.6  95.9 Fib head - Ankle 28 5.3 53 ?44 3.3     Pop fossa EDB 13.5  0.7  131 Pop fossa - Fib head 10 1.8 55 ?44 4.4         Pop fossa - Ankle  7.1     R Tibial - AH     Ankle AH 4.9 ?5.8 11.6 ?4.0 100 Ankle - AH 9    31.0     Pop fossa AH 13.4  10.1  87.6 Pop fossa - Ankle 35 8.5 41 ?41 30.5  L Tibial - AH     Ankle AH 4.4 ?5.8 12.1 ?4.0 100 Ankle - AH 9    30.9     Pop fossa AH 12.8  9.9  81.8 Pop fossa - Ankle 35 8.3 42 ?41 28.8  L Peroneal - EDB     Ankle EDB 5.8 ?6.5 1.9 ?2.0 100 Ankle - EDB 9    7.4     Fib head EDB 11.8  1.7  86.7 Fib head - Ankle 28 6.0 46 ?44 6.9     Pop fossa EDB 13.9  1.6  93.9 Pop fossa - Fib head 9 2.0 44 ?44 7.0         Pop fossa -  Ankle  8.1                F  Wave    Nerve F Lat Ref.   ms ms  R Tibial - AH 52.2 ?56.0  L Tibial - AH 52.8 ?56.0         EMG full       EMG Summary Table    Spontaneous MUAP Recruitment  Muscle IA Fib PSW Fasc Other Amp Dur. Poly Pattern  R. Iliopsoas Normal None None None _______ Normal Normal Normal Normal  R. Vastus medialis Normal None None None _______ Normal Normal Normal Normal  R. Tibialis anterior Normal None None None _______ Normal Normal Normal Normal  R. Gastrocnemius (Medial head) Normal None None None _______ Normal Normal Normal Normal  R. Gluteus medius Normal None None None _______ Normal Normal Normal Normal  R. Lumbar paraspinals Normal None None None _______ Normal Normal Normal Normal

## 2016-09-25 NOTE — Procedures (Signed)
   [facility]  NCS (NERVE CONDUCTION STUDY) WITH EMG (ELECTROMYOGRAPHY) REPORT   STUDY DATE: 09/24/16 PATIENT NAME: Stacey Ramirez DOB: [birth year] MRN: [id number]  ORDERING CLINICIAN: Rosalin Hawking, MD PhD   TECHNOLOGIST: Oneita Jolly ELECTROMYOGRAPHER: Earlean Polka. Marlyne Totaro, MD  CLINICAL INFORMATION: 76 year old female with intermittent weakness.  FINDINGS: NERVE CONDUCTION STUDY: Bilateral peroneal motor responses have normal distal latencies, decreased amplitudes, normal conduction velocities.  Bilateral tibial motor responses and F wave latencies are normal.  Bilateral sural and right superficial peroneal sensory responses are normal.  Left superficial peroneal sensory response has normal peak latency and borderline decreased amplitude.    NEEDLE ELECTROMYOGRAPHY: Needle examination of right upper extremity iliopsoas, Louise medius, vastus medialis, tibials anterior, gastrocnemius and right lumbar paraspinal muscles L5-S1 is normal.   IMPRESSION:  Mildly abnormal study demonstrate: 1. Mild axonal sensory motor polyneuropathy. 2. No electrodiagnostic evidence of underlying myopathy.    INTERPRETING PHYSICIAN:  Penni Bombard, MD Certified in Neurology, Neurophysiology and Neuroimaging  [facility] [address], [place],  [address] [phone]

## 2016-09-28 ENCOUNTER — Telehealth: Payer: Self-pay

## 2016-09-28 NOTE — Telephone Encounter (Signed)
-----   Message from Rosalin Hawking, MD sent at 09/25/2016  5:32 PM EST ----- Could you please let the patient know that the nerve conduction test done recently in our office showed no muscle disease. It showed mild neuropathy which is common for her age and not a concern. NO specific treatment needed. Relaxation, de-stress and building up confidence are the key for getting better. Please continue current treatment. Thanks.  Rosalin Hawking, MD PhD Stroke Neurology 09/25/2016 5:31 PM

## 2016-09-28 NOTE — Telephone Encounter (Signed)
Rn call patients husband on DPR about nerve conduction test. Rn stated it showed no muscle disease. It showed mild neuropathy which is common for her age not a concern. No specific treatment needed. PT just needs de stress, relaxation, building up confidence for getting better. Continue treatment plan. Pts husband verbalized understanding.

## 2016-10-08 DIAGNOSIS — E785 Hyperlipidemia, unspecified: Secondary | ICD-10-CM | POA: Diagnosis not present

## 2016-10-08 DIAGNOSIS — R319 Hematuria, unspecified: Secondary | ICD-10-CM | POA: Diagnosis not present

## 2016-10-12 ENCOUNTER — Ambulatory Visit (INDEPENDENT_AMBULATORY_CARE_PROVIDER_SITE_OTHER): Payer: PPO | Admitting: *Deleted

## 2016-10-12 DIAGNOSIS — R310 Gross hematuria: Secondary | ICD-10-CM | POA: Diagnosis not present

## 2016-10-12 DIAGNOSIS — R319 Hematuria, unspecified: Secondary | ICD-10-CM | POA: Diagnosis not present

## 2016-10-12 DIAGNOSIS — I639 Cerebral infarction, unspecified: Secondary | ICD-10-CM | POA: Diagnosis not present

## 2016-10-12 DIAGNOSIS — N390 Urinary tract infection, site not specified: Secondary | ICD-10-CM | POA: Diagnosis not present

## 2016-10-12 LAB — CUP PACEART REMOTE DEVICE CHECK
Implantable Pulse Generator Implant Date: 20170516
MDC IDC SESS DTM: 20180210231850

## 2016-10-13 ENCOUNTER — Telehealth: Payer: Self-pay | Admitting: Neurology

## 2016-10-13 NOTE — Telephone Encounter (Signed)
Rn call patients husband on dpr. Pts husband stated pt has been on plavix almost a year. Pt has not had this problem before with blood in the urine. Rn stated a fax was sent from pts PCP to Dr. Erlinda Hong about office visit and referral to urologist. Rn remind pt husband to have wife monitor her urine for any excessive blood and to call pcp if it occurs. Pts husband verbalized understanding.

## 2016-10-13 NOTE — Telephone Encounter (Signed)
Patient's husband calling. She was seen at her PCP last week because of having blood in urine. Tests showed she does not have UTI or kidney problems. Could this be coming form taking Plavix? Please call and discuss.

## 2016-10-13 NOTE — Progress Notes (Signed)
Carelink Summary Report / Loop Recorder 

## 2016-10-14 DIAGNOSIS — R319 Hematuria, unspecified: Secondary | ICD-10-CM | POA: Diagnosis not present

## 2016-10-14 DIAGNOSIS — R310 Gross hematuria: Secondary | ICD-10-CM | POA: Diagnosis not present

## 2016-10-14 DIAGNOSIS — N390 Urinary tract infection, site not specified: Secondary | ICD-10-CM | POA: Diagnosis not present

## 2016-10-18 LAB — CUP PACEART REMOTE DEVICE CHECK
Date Time Interrogation Session: 20180312234143
Implantable Pulse Generator Implant Date: 20170516

## 2016-10-18 NOTE — Progress Notes (Signed)
Carelink summary report received. Battery status OK. Normal device function. No new symptom episodes, tachy episodes, brady, or pause episodes. No new AF episodes. Monthly summary reports and ROV/PRN 

## 2016-10-22 DIAGNOSIS — R310 Gross hematuria: Secondary | ICD-10-CM | POA: Diagnosis not present

## 2016-10-22 DIAGNOSIS — N201 Calculus of ureter: Secondary | ICD-10-CM | POA: Diagnosis not present

## 2016-11-10 DIAGNOSIS — N200 Calculus of kidney: Secondary | ICD-10-CM | POA: Diagnosis not present

## 2016-11-10 DIAGNOSIS — N201 Calculus of ureter: Secondary | ICD-10-CM | POA: Diagnosis not present

## 2016-11-11 ENCOUNTER — Ambulatory Visit (INDEPENDENT_AMBULATORY_CARE_PROVIDER_SITE_OTHER): Payer: PPO | Admitting: *Deleted

## 2016-11-11 DIAGNOSIS — I639 Cerebral infarction, unspecified: Secondary | ICD-10-CM | POA: Diagnosis not present

## 2016-11-12 DIAGNOSIS — N200 Calculus of kidney: Secondary | ICD-10-CM | POA: Diagnosis not present

## 2016-11-12 NOTE — Progress Notes (Signed)
Carelink Summary Report / Loop Recorder 

## 2016-11-23 LAB — CUP PACEART REMOTE DEVICE CHECK
Implantable Pulse Generator Implant Date: 20170516
MDC IDC SESS DTM: 20180412000908

## 2016-11-30 ENCOUNTER — Ambulatory Visit: Payer: PPO | Attending: Internal Medicine | Admitting: Physical Therapy

## 2016-11-30 ENCOUNTER — Encounter: Payer: Self-pay | Admitting: Physical Therapy

## 2016-11-30 DIAGNOSIS — M6281 Muscle weakness (generalized): Secondary | ICD-10-CM | POA: Diagnosis not present

## 2016-11-30 DIAGNOSIS — R262 Difficulty in walking, not elsewhere classified: Secondary | ICD-10-CM | POA: Diagnosis not present

## 2016-11-30 NOTE — Therapy (Signed)
[place], [place], [address] Phone: [phone]   Fax:  [phone]  Physical Therapy Evaluation  Patient Details  Name: Stacey Ramirez MRN: [id number] Date of Birth: 1940-12-29 Referring Provider: Deland Pretty  Encounter Date: 11/30/2016      PT End of Session - 11/30/16 1513    Visit Number 1   Date for PT Re-Evaluation 01/30/17   PT Start Time 1439   PT Stop Time 1535   PT Time Calculation (min) 56 min   Activity Tolerance Patient tolerated treatment well   Behavior During Therapy [facility] for tasks assessed/performed      Past Medical History:  Diagnosis Date  . Cerebrovascular accident (stroke) ([place])    a. 12/2015 - cryptogenic.  S/P MDT Linq.  . Difficulty walking   . Essential hypertension   . History of dizziness   . Hypercholesterolemia   . Hypothyroidism   . Mitral regurgitation    a. 12/2015 Echo: EF 60-65%, mild focal basal hypertrophy. No rwma, triv AI, mild MR, mildly dil LA w/o thrombus. No RA thrombus. No PFO.  Marland Kitchen Palpitations   . Vitamin D deficiency     Past Surgical History:  Procedure Laterality Date  . CARDIOVASCULAR STRESS TEST  2002   NORMAL  . EP IMPLANTABLE DEVICE N/A 12/17/2015   Procedure: Loop Recorder Insertion;  Surgeon: [name], MD;  Location: [facility];  Service: Cardiovascular;  Laterality: N/A;  . LOOP RECORDER IMPLANT    . TEE WITHOUT CARDIOVERSION N/A 12/17/2015   Procedure: TRANSESOPHAGEAL ECHOCARDIOGRAM (TEE);  Surgeon: Lelon Perla, MD;  Location: [facility] ENDOSCOPY;  Service: Cardiovascular;  Laterality: N/A;  Pt also needs a LOOP  . TRANSTHORACIC ECHOCARDIOGRAM  2008   SHOWED LEFT VENTRICULAR HYPERTROPHY AND MILD AORTIC STENOSIS    There were no vitals filed for this visit.       Subjective Assessment - 11/30/16 1442    Subjective Patient reports a history of two CVA's, most recent being May 12th, reports that since the recent stroke  she has had much more difficulty walking and getting up and doing things.  She reports some reactions to statin drugs as well.Marland Kitchen  She was seen here from June 2017 to August with good results and her becoming more steady and was able to walk all distances.  Patient reports a fall in October and reports some increased difficulty walking in the past few months.     Limitations Walking   Patient Stated Goals walk better, be safe   Currently in Pain? No/denies            [facility] PT Assessment - 11/30/16 0001      Assessment   Medical Diagnosis difficulty walking   Referring Provider Deland Pretty   Onset Date/Surgical Date 10/30/16   Prior Therapy reports that she had PT, up until a few months ago at a different site, reports that she had about 12 visits     Precautions   Precautions Fall     Balance Screen   Has the patient fallen in the past 6 months Yes   How many times? 1   Has the patient had a decrease in activity level because of a fear of falling?  No   Is the patient reluctant to leave their home because of a fear of falling?  Yes     Home Environment   Additional Comments has stairs, cooking, cleaning and shopping  Prior Function   Level of Independence Independent   Vocation Retired   Leisure does a little walking inside the home     Posture/Postural Control   Posture Comments fwd head, rounded shoulders     ROM / Strength   AROM / PROM / Strength AROM;Strength     AROM   Overall AROM Comments WFL's     Strength   Overall Strength Comments Leg strength hips 3+/5, knee extension 4-/5, flexion right 4/5, left 3+/5, ankles 4-/5     Ambulation/Gait   Gait Comments Patient without assistive device, slow, left foot shuffles some, she has the biggest issue with any turns or small movements, she gets to the point where she does not pick up her feet and really has trouble turning and moving laterally, the first few steps are very slow and small, she can speed up and take  bigger steps with cues, but any change of directions will result in the shuffling again     Standardized Balance Assessment   Standardized Balance Assessment Berg Balance Test;Timed Up and Go Test     Berg Balance Test   Sit to Stand Able to stand  independently using hands   Standing Unsupported Able to stand safely 2 minutes   Sitting with Back Unsupported but Feet Supported on Floor or Stool Able to sit safely and securely 2 minutes   Stand to Sit Uses backs of legs against chair to control descent   Transfers Able to transfer safely, definite need of hands   Standing Unsupported with Eyes Closed Able to stand 10 seconds with supervision   Standing Ubsupported with Feet Together Able to place feet together independently and stand for 1 minute with supervision   From Standing, Reach Forward with Outstretched Arm Can reach forward >12 cm safely (5")   From Standing Position, Pick up Object from Floor Able to pick up shoe safely and easily   From Standing Position, Turn to Look Behind Over each Shoulder Turn sideways only but maintains balance   Turn 360 Degrees Able to turn 360 degrees safely but slowly   Standing Unsupported, Alternately Place Feet on Step/Stool Able to complete >2 steps/needs minimal assist   Standing Unsupported, One Foot in Front Able to take small step independently and hold 30 seconds   Standing on One Leg Tries to lift leg/unable to hold 3 seconds but remains standing independently   Total Score 37     Timed Up and Go Test   Normal TUG (seconds) 33                   [facility] Adult PT Treatment/Exercise - 11/30/16 0001      Exercises   Exercises Knee/Hip     Knee/Hip Exercises: Aerobic   Nustep LEvel 4 x 5 minutes     Knee/Hip Exercises: Machines for Strengthening   Cybex Knee Extension 5# 2x10   Cybex Knee Flexion 20# 2x10                  PT Short Term Goals - 11/30/16 1555      PT SHORT TERM GOAL #1   Title independent with her  home HEP   Time 2   Period Weeks   Status New           PT Long Term Goals - 11/30/16 1555      PT LONG TERM GOAL #1   Title decrease TUG time to 20 seconds   Time 8  Period Weeks   Status New     PT LONG TERM GOAL #2   Title increase Berg balance score to 46/56   Time 8   Period Weeks   Status New     PT LONG TERM GOAL #3   Title increase LE strength to 4/5   Time 8   Period Weeks   Status New     PT LONG TERM GOAL #4   Title walk 700 feet without difficulty   Time 8   Period Weeks   Status New               Plan - 11/30/16 1514    Clinical Impression Statement Pateint was seen here in June 2017 due to 2 CVA's adn an issue with usteady gait.  She reports that she had a fall in October or November, she reports some difficulty walking at times since then and she reports that she is fearful and takes shuffling steps.  She reports that she is very fearful recently and hesitant to walk and go places.  She does mention to me that she had PT earlier this year at another place but was discharged.  I am unsure to how many visits she had.  She does have issues with BERG a 37/56 and TUG  33 seconds.  Her BERG at D/C last year was 44/56, her TUG time was under 20 seconds.  So, she has regressed.   Rehab Potential Good   PT Frequency 2x / week   PT Duration 8 weeks   PT Treatment/Interventions ADLs/Self Care Home Management;Therapeutic activities;Therapeutic exercise;Balance training;Neuromuscular re-education;Patient/family education;Functional mobility training;Gait training;Stair training   PT Next Visit Plan Work on gait and the initiation of motions especially stepping and turning   Consulted and Agree with Plan of Care Patient      Patient will benefit from skilled therapeutic intervention in order to improve the following deficits and impairments:  Abnormal gait, Decreased activity tolerance, Decreased balance, Decreased mobility, Decreased strength, Dizziness,  Decreased range of motion, Difficulty walking  Visit Diagnosis: Difficulty in walking, not elsewhere classified - Plan: PT plan of care cert/re-cert  Muscle weakness (generalized) - Plan: PT plan of care cert/re-cert      G-Codes - 82/42/35 1556    Functional Assessment Tool Used (Outpatient Only) foto 60% limitation   Functional Limitation Mobility: Walking and moving around   Mobility: Walking and Moving Around Current Status ([id number]) At least 60 percent but less than 80 percent impaired, limited or restricted   Mobility: Walking and Moving Around Goal Status [phone]) At least 40 percent but less than 60 percent impaired, limited or restricted       Problem List Patient Active Problem List   Diagnosis Date Noted  . Vascular parkinsonism ([place]) 06/29/2016  . Gait disorder 04/07/2016  . Intracranial carotid stenosis 02/27/2016  . Essential hypertension   . Cerebrovascular accident (CVA) due to stenosis of right carotid artery ([place])   . Left-sided weakness 12/28/2015  . Accelerated hypertension 12/28/2015  . Headache 12/28/2015  . Adjustment reaction with anxiety 12/20/2015  . Stroke due to embolism of right middle cerebral artery ([place]) 12/17/2015  . Left hemiparesis ([place])   . Cerebrovascular accident (CVA) due to occlusion of cerebral artery ([place])   . History of CVA with residual deficit   . Prediabetes   . Right Basal ganglia infarction ([place]) 12/14/2015  . Acute CVA (cerebrovascular accident) ([place]) 12/14/2015  . SVT (supraventricular tachycardia) ([place]) 12/14/2015  . Stroke [facility])  12/13/2015  . Stroke-like symptom 12/13/2015  . History of stroke   . Heart palpitations 09/06/2012  . Hypertension   . Hyperlipidemia   . Hypothyroidism   . Vitamin D deficiency   . Palpitations   . History of dizziness   . LVH (left ventricular hypertrophy)   . Aortic stenosis   . Mitral regurgitation   . SOB (shortness of breath)   . Difficulty walking     Sumner Boast.,  PT 11/30/2016, 3:59 PM  [place], [place], [address] Phone: [phone]   Fax:  [phone]  Name: Stacey Ramirez MRN: [id number] Date of Birth: 10-25-1940

## 2016-12-01 ENCOUNTER — Encounter: Payer: Self-pay | Admitting: Cardiovascular Disease

## 2016-12-01 ENCOUNTER — Ambulatory Visit (INDEPENDENT_AMBULATORY_CARE_PROVIDER_SITE_OTHER): Payer: PPO | Admitting: Cardiovascular Disease

## 2016-12-01 VITALS — BP 160/80 | HR 79 | Ht 62.0 in | Wt 116.8 lb

## 2016-12-01 DIAGNOSIS — E782 Mixed hyperlipidemia: Secondary | ICD-10-CM | POA: Diagnosis not present

## 2016-12-01 DIAGNOSIS — I10 Essential (primary) hypertension: Secondary | ICD-10-CM | POA: Diagnosis not present

## 2016-12-01 DIAGNOSIS — I63231 Cerebral infarction due to unspecified occlusion or stenosis of right carotid arteries: Secondary | ICD-10-CM

## 2016-12-01 NOTE — Progress Notes (Signed)
Office Visit    Patient Name: Stacey Ramirez Date of Encounter: 12/01/2016  Primary Care Provider:  Horatio Pel, MD Primary Cardiologist:  Vaughan Browner, MD  P. Nahser, MD   Chief Complaint    76 year old female with a history of palpitations, hypertension, hyperlipidemia, and recent stroke who presents for follow-up.     Past Medical History    Past Medical History:  Diagnosis Date  . Cerebrovascular accident (stroke) ([place])    a. 12/2015 - cryptogenic.  S/P MDT Linq.  . Difficulty walking   . Essential hypertension   . History of dizziness   . Hypercholesterolemia   . Hypothyroidism   . Mitral regurgitation    a. 12/2015 Echo: EF 60-65%, mild focal basal hypertrophy. No rwma, triv AI, mild MR, mildly dil LA w/o thrombus. No RA thrombus. No PFO.  Marland Kitchen Palpitations   . Vitamin D deficiency    Past Surgical History:  Procedure Laterality Date  . CARDIOVASCULAR STRESS TEST  2002   NORMAL  . EP IMPLANTABLE DEVICE N/A 12/17/2015   Procedure: Loop Recorder Insertion;  Surgeon: [name], MD;  Location: [facility];  Service: Cardiovascular;  Laterality: N/A;  . LOOP RECORDER IMPLANT    . TEE WITHOUT CARDIOVERSION N/A 12/17/2015   Procedure: TRANSESOPHAGEAL ECHOCARDIOGRAM (TEE);  Surgeon: Lelon Perla, MD;  Location: [facility] ENDOSCOPY;  Service: Cardiovascular;  Laterality: N/A;  Pt also needs a LOOP  . TRANSTHORACIC ECHOCARDIOGRAM  2008   SHOWED LEFT VENTRICULAR HYPERTROPHY AND MILD AORTIC STENOSIS    Allergies  Allergies  Allergen Reactions  . Sulfa Drugs Cross Reactors Itching  . Zocor [Simvastatin] Other (See Comments)    Feels like she is going to pass out, weakness  . Crestor [Rosuvastatin Calcium]     Muscle weakness  . Statins Other (See Comments)    Unable to walk or stand.  . Micardis [Telmisartan] Other (See Comments)    Feels like she is going to pass out    Notes from Ignacia Bayley, NP    76 year old female previously followed by Dr.  Mare Ferrari for hypertension and palpitations. She recently was admitted for cryptogenic stroke (right basal ganglia infarct), and underwent TEE revealing normal LV function without evidence of atrial thrombus. She subsequently underwent placement of an implantable loop recorder. She has had no events on this recorder up to this point. She has been recovering well from her stroke and can now walk, though with a limp. She is encouraged overall. Her blood pressure at home has been running in the 140s to 150s, and her husband has been checking it about 3 times per day. She has not been having any palpitations, chest pain, dyspnea, PND, orthopnea, dizziness, syncope, edema, or early satiety.  Oct. 20 ,2017: Stacey Ramirez is seen for the first time today.  Transfer from [place] . Has some leg / feet swelling  - likely due to the amlodipine .   Dec 01, 2016:  Is having some difficulty in walking since her stroke.   Working with PT.  Brought her BP log with her.   Has some elevated readings and then later will have low readings.  Suggested that she might be eating salty foods at times .   No CP or dyspnea    Has an implantable loop recorder.   Has not had any syncope Has not had it interrogated.      Has poor balance   Home Medications    Prior to Admission medications  Medication Sig Start Date End Date Taking? Authorizing Provider  ALPRAZolam (XANAX) 0.25 MG tablet Take 1 tablet (0.25 mg total) by mouth at bedtime. 12/25/15  Yes Ivan Anchors Love, PA-C  amLODipine (NORVASC) 10 MG tablet Take 1 tablet (10 mg total) by mouth 2 (two) times daily. Reported on 12/27/2015 01/23/16  Yes Rogelia Mire, NP  aspirin EC 325 MG EC tablet Take 1 tablet (325 mg total) by mouth daily. 12/17/15  Yes Lavina Hamman, MD  B Complex-C-Folic Acid (STRESS FORMULA) TABS Take 1 tablet by mouth daily.    Yes Historical Provider, MD  carboxymethylcellulose (REFRESH PLUS) 0.5 % SOLN Place 1 drop into both eyes 3 (three) times daily  as needed.   Yes Historical Provider, MD  clopidogrel (PLAVIX) 75 MG tablet Take 1 tablet (75 mg total) by mouth daily. 12/24/15  Yes Ivan Anchors Love, PA-C  estradiol (ESTRACE) 0.1 MG/GM vaginal cream Place 2 g vaginally as needed (dryness).    Yes Historical Provider, MD  irbesartan (AVAPRO) 300 MG tablet Take 1 tablet (300 mg total) by mouth daily. 12/29/15  Yes Velvet Bathe, MD  levothyroxine (SYNTHROID, LEVOTHROID) 25 MCG tablet Take 25 mcg by mouth daily before breakfast.  09/21/14  Yes Historical Provider, MD  LOTEMAX 0.5 % ophthalmic suspension Place 1 drop into both eyes 4 (four) times daily as needed. For dry itchy eyes 02/17/13  Yes Historical Provider, MD  metoprolol tartrate (LOPRESSOR) 25 MG tablet Take 0.5 tablets (12.5 mg total) by mouth 2 (two) times daily. 12/24/15  Yes Ivan Anchors Love, PA-C  Multiple Vitamin (MULTIVITAMIN WITH MINERALS) TABS tablet Take 1 tablet by mouth daily.   Yes Historical Provider, MD  polyethylene glycol (MIRALAX / GLYCOLAX) packet Take 17 g by mouth daily. Patient taking differently: Take 17 g by mouth daily as needed for moderate constipation.  12/17/15  Yes Lavina Hamman, MD  sertraline (ZOLOFT) 50 MG tablet Take 50 mg by mouth daily.  01/02/16  Yes Historical Provider, MD    Review of Systems    Strength is been improving since her stroke. She continues to walk with a limp but overall is pleased with her progression. She denies chest pain, palpitations, PND, orthopnea, dizziness, syncope, edema, or early satiety.  All other systems reviewed and are otherwise negative except as noted above.  Physical Exam    VS:  BP (!) 160/80 (BP Location: Left Arm, Patient Position: Sitting, Cuff Size: Normal)   Pulse 79   Ht 5\' 2"  (1.575 m)   Wt 116 lb 12.8 oz (53 kg)   SpO2 97%   BMI 21.36 kg/m  , BMI Body mass index is 21.36 kg/m. GEN: Well nourished, well developed, in no acute distress.  HEENT: normal.  Neck: Supple, no JVD, carotid bruits, or masses. Cardiac:  RRR, no murmurs, rubs, or gallops. No clubbing, cyanosis, edema.  Radials/DP/PT 2+ and equal bilaterally.  Respiratory:  Respirations regular and unlabored, clear to auscultation bilaterally. GI: Soft, nontender, nondistended, BS + x 4. MS: no deformity or atrophy. Skin: warm and dry, no rash. Neuro:  Strength and sensation are intact. Psych: Normal affect.  Accessory Clinical Findings    ECG , Dec 01, 2016:    NSR at 56.  Normal ECG   Assessment & Plan    1.  Cryptogenic stroke: Status post implantable loop recorder. This is followed by our electrophysiology team.  .  2. Essential hypertension: Blood pressure is okay today but has been Somewhat variable. Husband measures  her BP each day .     4. Hyperlipidemia:    intolerent to statins. Tried Repatha and Pralulent and her chol levels did not improve at all.     Mertie Moores, MD  12/01/2016 2:45 PM    [facility] [place],  [place], [place]  [address] Pager [phone] Phone: [phone]; Fax: [phone]

## 2016-12-01 NOTE — Patient Instructions (Signed)

## 2016-12-02 ENCOUNTER — Ambulatory Visit: Payer: PPO | Attending: Internal Medicine | Admitting: Physical Therapy

## 2016-12-02 DIAGNOSIS — R262 Difficulty in walking, not elsewhere classified: Secondary | ICD-10-CM | POA: Diagnosis not present

## 2016-12-02 DIAGNOSIS — M6281 Muscle weakness (generalized): Secondary | ICD-10-CM

## 2016-12-02 DIAGNOSIS — F419 Anxiety disorder, unspecified: Secondary | ICD-10-CM | POA: Insufficient documentation

## 2016-12-02 DIAGNOSIS — G8194 Hemiplegia, unspecified affecting left nondominant side: Secondary | ICD-10-CM | POA: Diagnosis not present

## 2016-12-02 NOTE — Therapy (Signed)
[place], [place], [address] Phone: [phone]   Fax:  [phone]  Physical Therapy Treatment  Patient Details  Name: Stacey Ramirez MRN: [id number] Date of Birth: Oct 24, 1940 Referring Provider: Deland Pretty  Encounter Date: 12/02/2016      PT End of Session - 12/02/16 1528    Visit Number 2   Date for PT Re-Evaluation 01/30/17   PT Start Time 1446   PT Stop Time 1528   PT Time Calculation (min) 42 min   Activity Tolerance Patient tolerated treatment well   Behavior During Therapy [facility] for tasks assessed/performed      Past Medical History:  Diagnosis Date  . Cerebrovascular accident (stroke) ([place])    a. 12/2015 - cryptogenic.  S/P MDT Linq.  . Difficulty walking   . Essential hypertension   . History of dizziness   . Hypercholesterolemia   . Hypothyroidism   . Mitral regurgitation    a. 12/2015 Echo: EF 60-65%, mild focal basal hypertrophy. No rwma, triv AI, mild MR, mildly dil LA w/o thrombus. No RA thrombus. No PFO.  Marland Kitchen Palpitations   . Vitamin D deficiency     Past Surgical History:  Procedure Laterality Date  . CARDIOVASCULAR STRESS TEST  2002   NORMAL  . EP IMPLANTABLE DEVICE N/A 12/17/2015   Procedure: Loop Recorder Insertion;  Surgeon: [name], MD;  Location: [facility];  Service: Cardiovascular;  Laterality: N/A;  . LOOP RECORDER IMPLANT    . TEE WITHOUT CARDIOVERSION N/A 12/17/2015   Procedure: TRANSESOPHAGEAL ECHOCARDIOGRAM (TEE);  Surgeon: Lelon Perla, MD;  Location: [facility] ENDOSCOPY;  Service: Cardiovascular;  Laterality: N/A;  Pt also needs a LOOP  . TRANSTHORACIC ECHOCARDIOGRAM  2008   SHOWED LEFT VENTRICULAR HYPERTROPHY AND MILD AORTIC STENOSIS    There were no vitals filed for this visit.      Subjective Assessment - 12/02/16 1452    Subjective Pt. doing well today.     Patient Stated Goals walk better, be safe   Currently in Pain? No/denies   Multiple Pain Sites No                         [facility] Adult PT Treatment/Exercise - 12/02/16 1457      Neuro Re-ed    Neuro Re-ed Details  At counter: Tandem walk forward/backward x 5 laps; 1 UE support; tandem stance 2 x 30 sec each way; side stepping wiht 2 black bolster step over x 5 laps     Knee/Hip Exercises: Machines for Strengthening   Cybex Knee Extension 10# x 15 reps   Cybex Knee Flexion 20# x 15 reps     Knee/Hip Exercises: Standing   Hip Flexion AROM;1 set;Right;Left;Stengthening;15 reps;Knee bent   Hip Flexion Limitations at counter    Hip Abduction AROM;1 set;Right;Left;Stengthening;15 reps;Knee straight   Abduction Limitations at counter    Hip Extension AROM;1 set;Right;Left;Stengthening;15 reps;Knee straight   Extension Limitations at counter    Other Standing Knee Exercises Alternating step to 8" step with cross-over reach to white bolster x 15 reps each way; close CGA from therapist   Other Standing Knee Exercises Alternating step over black bolster with turn x 5 laps down back; cues to avoid shuffling and continues step over with bolster                   PT Short Term Goals - 12/02/16 1536  PT SHORT TERM GOAL #1   Title independent with her home HEP   Time 2   Period Weeks   Status On-going           PT Long Term Goals - 12/02/16 1536      PT LONG TERM GOAL #1   Title decrease TUG time to 20 seconds   Time 8   Period Weeks   Status On-going     PT LONG TERM GOAL #2   Title increase Berg balance score to 46/56   Time 8   Period Weeks   Status On-going     PT LONG TERM GOAL #3   Title increase LE strength to 4/5   Time 8   Period Weeks   Status On-going     PT LONG TERM GOAL #4   Title walk 700 feet without difficulty   Time 8   Period Weeks   Status On-going               Plan - 12/02/16 1536    Clinical Impression Statement Pt. doing well today and performed well with all LE strengthening  activity and balance training.  Balance training focusing on stepping, reaching, and turning.  Cueing to avoid shuffling, for upright posture, and frequent cueing to stay on task required throughout treatment.  Pt. easily distractible however compliant and motivated reporting consistent HEP adherence.  Will continue to benefit from further skilled therapy to improve balance, strength, and safety with gait.   PT Treatment/Interventions ADLs/Self Care Home Management;Therapeutic activities;Therapeutic exercise;Balance training;Neuromuscular re-education;Patient/family education;Functional mobility training;Gait training;Stair training   PT Next Visit Plan Work on gait and the initiation of motions especially stepping and turning      Patient will benefit from skilled therapeutic intervention in order to improve the following deficits and impairments:  Abnormal gait, Decreased activity tolerance, Decreased balance, Decreased mobility, Decreased strength, Dizziness, Decreased range of motion, Difficulty walking  Visit Diagnosis: Difficulty in walking, not elsewhere classified  Muscle weakness (generalized)     Problem List Patient Active Problem List   Diagnosis Date Noted  . Vascular parkinsonism ([place]) 06/29/2016  . Gait disorder 04/07/2016  . Intracranial carotid stenosis 02/27/2016  . Essential hypertension   . Cerebrovascular accident (CVA) due to stenosis of right carotid artery ([place])   . Left-sided weakness 12/28/2015  . Accelerated hypertension 12/28/2015  . Headache 12/28/2015  . Adjustment reaction with anxiety 12/20/2015  . Stroke due to embolism of right middle cerebral artery ([place]) 12/17/2015  . Left hemiparesis ([place])   . Cerebrovascular accident (CVA) due to occlusion of cerebral artery ([place])   . History of CVA with residual deficit   . Prediabetes   . Right Basal ganglia infarction ([place]) 12/14/2015  . Acute CVA (cerebrovascular accident) ([place]) 12/14/2015  . SVT  (supraventricular tachycardia) ([place]) 12/14/2015  . Stroke ([place]) 12/13/2015  . Stroke-like symptom 12/13/2015  . History of stroke   . Heart palpitations 09/06/2012  . Hypertension   . Hyperlipidemia   . Hypothyroidism   . Vitamin D deficiency   . Palpitations   . History of dizziness   . LVH (left ventricular hypertrophy)   . Aortic stenosis   . Mitral regurgitation   . SOB (shortness of breath)   . Difficulty walking     Bess Harvest, [place] 12/02/16 4:15 PM  [place] [address] [place], [place], [address] Phone: [phone]   Fax:  [phone]  Name:  Stacey Ramirez MRN: [id number] Date of Birth: Nov 17, 1940

## 2016-12-07 DIAGNOSIS — E7800 Pure hypercholesterolemia, unspecified: Secondary | ICD-10-CM | POA: Diagnosis not present

## 2016-12-08 ENCOUNTER — Encounter: Payer: Self-pay | Admitting: Physical Therapy

## 2016-12-08 ENCOUNTER — Ambulatory Visit: Payer: PPO | Admitting: Physical Therapy

## 2016-12-08 DIAGNOSIS — M6281 Muscle weakness (generalized): Secondary | ICD-10-CM

## 2016-12-08 DIAGNOSIS — G8194 Hemiplegia, unspecified affecting left nondominant side: Secondary | ICD-10-CM

## 2016-12-08 DIAGNOSIS — R262 Difficulty in walking, not elsewhere classified: Secondary | ICD-10-CM | POA: Diagnosis not present

## 2016-12-08 NOTE — Therapy (Signed)
[place], [place], [address] Phone: [phone]   Fax:  [phone]  Physical Therapy Treatment  Patient Details  Name: Stacey Ramirez MRN: [id number] Date of Birth: [birth year] Referring Provider: Deland Pretty  Encounter Date: 12/08/2016      PT End of Session - 12/08/16 1444    Visit Number 3   Date for PT Re-Evaluation 01/30/17   PT Start Time 1400   PT Stop Time 1445   PT Time Calculation (min) 45 min      Past Medical History:  Diagnosis Date  . Cerebrovascular accident (stroke) ([place])    a. 12/2015 - cryptogenic.  S/P MDT Linq.  . Difficulty walking   . Essential hypertension   . History of dizziness   . Hypercholesterolemia   . Hypothyroidism   . Mitral regurgitation    a. 12/2015 Echo: EF 60-65%, mild focal basal hypertrophy. No rwma, triv AI, mild MR, mildly dil LA w/o thrombus. No RA thrombus. No PFO.  Marland Kitchen Palpitations   . Vitamin D deficiency     Past Surgical History:  Procedure Laterality Date  . CARDIOVASCULAR STRESS TEST  2002   NORMAL  . EP IMPLANTABLE DEVICE N/A 12/17/2015   Procedure: Loop Recorder Insertion;  Surgeon: [name], MD;  Location: [facility];  Service: Cardiovascular;  Laterality: N/A;  . LOOP RECORDER IMPLANT    . TEE WITHOUT CARDIOVERSION N/A 12/17/2015   Procedure: TRANSESOPHAGEAL ECHOCARDIOGRAM (TEE);  Surgeon: Lelon Perla, MD;  Location: [facility] ENDOSCOPY;  Service: Cardiovascular;  Laterality: N/A;  Pt also needs a LOOP  . TRANSTHORACIC ECHOCARDIOGRAM  2008   SHOWED LEFT VENTRICULAR HYPERTROPHY AND MILD AORTIC STENOSIS    There were no vitals filed for this visit.      Subjective Assessment - 12/08/16 1401    Subjective doing okay- some days are better than others with waking   Currently in Pain? No/denies                         [facility] Adult PT Treatment/Exercise - 12/08/16 0001      Knee/Hip Exercises: Aerobic    Stationary Bike 5 min for endurance at end of session    Nustep L 4 x 6 minutes     Knee/Hip Exercises: Machines for Strengthening   Cybex Knee Extension 10# 2 sets 10   Cybex Knee Flexion 20# 2 sets 10   Cybex Leg Press 30# 2 sets 10  calf raises 30 # 2 sets 10     Knee/Hip Exercises: Standing   Walking with Sports Cord 20# 2 times each way   Other Standing Knee Exercises sit to stand with wt ball   Other Standing Knee Exercises ball toss on airex                  PT Short Term Goals - 12/08/16 1448      PT SHORT TERM GOAL #1   Title independent with her home HEP   Baseline balacne at counter marching fwd/back,side stepping and tandem   Status Achieved           PT Long Term Goals - 12/02/16 1536      PT LONG TERM GOAL #1   Title decrease TUG time to 20 seconds   Time 8   Period Weeks   Status On-going     PT LONG TERM GOAL #2   Title  increase Berg balance score to 46/56   Time 8   Period Weeks   Status On-going     PT LONG TERM GOAL #3   Title increase LE strength to 4/5   Time 8   Period Weeks   Status On-going     PT LONG TERM GOAL #4   Title walk 700 feet without difficulty   Time 8   Period Weeks   Status On-going               Plan - 12/08/16 1444    Clinical Impression Statement pt  felt good about wt exercises today. difficulty with resisted gait and ball toss on airex, poor righting reaction esp backwards- LOB numerous times requiring assistance to prevent fall.   PT Duration 4 weeks   PT Next Visit Plan advanced gait and balance, LE strength      Patient will benefit from skilled therapeutic intervention in order to improve the following deficits and impairments:  Abnormal gait, Decreased activity tolerance, Decreased balance, Decreased mobility, Decreased strength, Dizziness, Decreased range of motion, Difficulty walking  Visit Diagnosis: Difficulty in walking, not elsewhere classified  Muscle weakness  (generalized)  Left hemiparesis ([place])     Problem List Patient Active Problem List   Diagnosis Date Noted  . Vascular parkinsonism ([place]) 06/29/2016  . Gait disorder 04/07/2016  . Intracranial carotid stenosis 02/27/2016  . Essential hypertension   . Cerebrovascular accident (CVA) due to stenosis of right carotid artery ([place])   . Left-sided weakness 12/28/2015  . Accelerated hypertension 12/28/2015  . Headache 12/28/2015  . Adjustment reaction with anxiety 12/20/2015  . Stroke due to embolism of right middle cerebral artery ([place]) 12/17/2015  . Left hemiparesis ([place])   . Cerebrovascular accident (CVA) due to occlusion of cerebral artery ([place])   . History of CVA with residual deficit   . Prediabetes   . Right Basal ganglia infarction ([place]) 12/14/2015  . Acute CVA (cerebrovascular accident) ([place]) 12/14/2015  . SVT (supraventricular tachycardia) ([place]) 12/14/2015  . Stroke ([place]) 12/13/2015  . Stroke-like symptom 12/13/2015  . History of stroke   . Heart palpitations 09/06/2012  . Hypertension   . Hyperlipidemia   . Hypothyroidism   . Vitamin D deficiency   . Palpitations   . History of dizziness   . LVH (left ventricular hypertrophy)   . Aortic stenosis   . Mitral regurgitation   . SOB (shortness of breath)   . Difficulty walking     Diamante Truszkowski,ANGIE PTA 12/08/2016, 2:51 PM  [place], [place], [address] Phone: [phone]   Fax:  [phone]  Name: Stacey Ramirez MRN: [id number] Date of Birth: [birth year]

## 2016-12-10 ENCOUNTER — Encounter: Payer: Self-pay | Admitting: Physical Therapy

## 2016-12-10 ENCOUNTER — Ambulatory Visit: Payer: PPO | Admitting: Physical Therapy

## 2016-12-10 DIAGNOSIS — I10 Essential (primary) hypertension: Secondary | ICD-10-CM | POA: Diagnosis not present

## 2016-12-10 DIAGNOSIS — G8194 Hemiplegia, unspecified affecting left nondominant side: Secondary | ICD-10-CM

## 2016-12-10 DIAGNOSIS — M6281 Muscle weakness (generalized): Secondary | ICD-10-CM

## 2016-12-10 DIAGNOSIS — R262 Difficulty in walking, not elsewhere classified: Secondary | ICD-10-CM

## 2016-12-10 DIAGNOSIS — Z8673 Personal history of transient ischemic attack (TIA), and cerebral infarction without residual deficits: Secondary | ICD-10-CM | POA: Diagnosis not present

## 2016-12-10 DIAGNOSIS — H6123 Impacted cerumen, bilateral: Secondary | ICD-10-CM | POA: Diagnosis not present

## 2016-12-10 DIAGNOSIS — E785 Hyperlipidemia, unspecified: Secondary | ICD-10-CM | POA: Diagnosis not present

## 2016-12-10 NOTE — Therapy (Signed)
[place], [place], [address] Phone: [phone]   Fax:  [phone]  Physical Therapy Treatment  Patient Details  Name: Stacey Ramirez MRN: [id number] Date of Birth: [birth year] Referring Provider: Deland Pretty  Encounter Date: 12/10/2016      PT End of Session - 12/10/16 1602    Visit Number 4   Date for PT Re-Evaluation 01/30/17      Past Medical History:  Diagnosis Date  . Cerebrovascular accident (stroke) ([place])    a. 12/2015 - cryptogenic.  S/P MDT Linq.  . Difficulty walking   . Essential hypertension   . History of dizziness   . Hypercholesterolemia   . Hypothyroidism   . Mitral regurgitation    a. 12/2015 Echo: EF 60-65%, mild focal basal hypertrophy. No rwma, triv AI, mild MR, mildly dil LA w/o thrombus. No RA thrombus. No PFO.  Marland Kitchen Palpitations   . Vitamin D deficiency     Past Surgical History:  Procedure Laterality Date  . CARDIOVASCULAR STRESS TEST  2002   NORMAL  . EP IMPLANTABLE DEVICE N/A 12/17/2015   Procedure: Loop Recorder Insertion;  Surgeon: [name], MD;  Location: [facility];  Service: Cardiovascular;  Laterality: N/A;  . LOOP RECORDER IMPLANT    . TEE WITHOUT CARDIOVERSION N/A 12/17/2015   Procedure: TRANSESOPHAGEAL ECHOCARDIOGRAM (TEE);  Surgeon: Lelon Perla, MD;  [facility] ENDOSCOPY;  Service: Cardiovascular;  Laterality: N/A;  Pt also needs a LOOP  . TRANSTHORACIC ECHOCARDIOGRAM  2008   SHOWED LEFT VENTRICULAR HYPERTROPHY AND MILD AORTIC STENOSIS    There were no vitals filed for this visit.      Subjective Assessment - 12/10/16 1537    Subjective walking good at times and other times LOB   Currently in Pain? No/denies                         [facility] Adult PT Treatment/Exercise - 12/10/16 0001      Ambulation/Gait   Gait Comments gait around building all surfaces CGA with multi LOB but recovered without assiistance.  difficulty with transitioning to different surfaces and esp with inclines had difficulty. 1000 feet with good cadeance.     High Level Balance   High Level Balance Activities Braiding;Side stepping;Tandem walking;Negotitating around obstacles;Negotiating over obstacles  tandem and directional changes are very difficult of pt   High Level Balance Comments ball toss and kick  difficulty kicking with Left leg     Knee/Hip Exercises: Aerobic   Elliptical 2 fwd/1 back I 3 R 2   Nustep L 5 6 min     Knee/Hip Exercises: Machines for Strengthening   Cybex Knee Extension 10# 2 sets 10   Cybex Knee Flexion 20# 2 sets 10   Other Machine seated row and lat pull 15# 2 sets 10     Knee/Hip Exercises: Standing   Other Standing Knee Exercises sit to stand with wt ball   Other Standing Knee Exercises alt step tap 2 sets 20 min A                  PT Short Term Goals - 12/08/16 1448      PT SHORT TERM GOAL #1   Title independent with her home HEP   Baseline balacne at counter marching fwd/back,side stepping and tandem   Status Achieved           PT Long  Term Goals - 12/02/16 1536      PT LONG TERM GOAL #1   Title decrease TUG time to 20 seconds   Time 8   Period Weeks   Status On-going     PT LONG TERM GOAL #2   Title increase Berg balance score to 46/56   Time 8   Period Weeks   Status On-going     PT LONG TERM GOAL #3   Title increase LE strength to 4/5   Time 8   Period Weeks   Status On-going     PT LONG TERM GOAL #4   Title walk 700 feet without difficulty   Time 8   Period Weeks   Status On-going               Plan - 12/10/16 1559    Clinical Impression Statement pt with difficulty transitioning to various surfaces and directonal changes. difficulty with inclines. decrease coordination with ball kicking- cuing needed.Good cadeance with gait.   PT Next Visit Plan advanced gait and balance, LE strength      Patient will benefit from skilled  therapeutic intervention in order to improve the following deficits and impairments:  Abnormal gait, Decreased activity tolerance, Decreased balance, Decreased mobility, Decreased strength, Dizziness, Decreased range of motion, Difficulty walking  Visit Diagnosis: Difficulty in walking, not elsewhere classified  Muscle weakness (generalized)  Left hemiparesis ([place])     Problem List Patient Active Problem List   Diagnosis Date Noted  . Vascular parkinsonism ([place]) 06/29/2016  . Gait disorder 04/07/2016  . Intracranial carotid stenosis 02/27/2016  . Essential hypertension   . Cerebrovascular accident (CVA) due to stenosis of right carotid artery ([place])   . Left-sided weakness 12/28/2015  . Accelerated hypertension 12/28/2015  . Headache 12/28/2015  . Adjustment reaction with anxiety 12/20/2015  . Stroke due to embolism of right middle cerebral artery ([place]) 12/17/2015  . Left hemiparesis ([place])   . Cerebrovascular accident (CVA) due to occlusion of cerebral artery ([place])   . History of CVA with residual deficit   . Prediabetes   . Right Basal ganglia infarction ([place]) 12/14/2015  . Acute CVA (cerebrovascular accident) ([place]) 12/14/2015  . SVT (supraventricular tachycardia) ([place]) 12/14/2015  . Stroke ([place]) 12/13/2015  . Stroke-like symptom 12/13/2015  . History of stroke   . Heart palpitations 09/06/2012  . Hypertension   . Hyperlipidemia   . Hypothyroidism   . Vitamin D deficiency   . Palpitations   . History of dizziness   . LVH (left ventricular hypertrophy)   . Aortic stenosis   . Mitral regurgitation   . SOB (shortness of breath)   . Difficulty walking     Brennah Quraishi,ANGIE PTA 12/10/2016, 4:05 PM  [place], [place], [address] Phone: [phone]   Fax:  [phone]  Name: Stacey Ramirez MRN: [id number] Date of Birth: 1940/08/30

## 2016-12-11 ENCOUNTER — Ambulatory Visit (INDEPENDENT_AMBULATORY_CARE_PROVIDER_SITE_OTHER): Payer: PPO | Admitting: *Deleted

## 2016-12-11 DIAGNOSIS — I63231 Cerebral infarction due to unspecified occlusion or stenosis of right carotid arteries: Secondary | ICD-10-CM

## 2016-12-14 NOTE — Progress Notes (Signed)
Carelink Summary Report / Loop Recorder 

## 2016-12-15 ENCOUNTER — Ambulatory Visit: Payer: PPO | Admitting: Physical Therapy

## 2016-12-17 ENCOUNTER — Ambulatory Visit: Payer: PPO | Admitting: Physical Therapy

## 2016-12-17 LAB — CUP PACEART REMOTE DEVICE CHECK
Date Time Interrogation Session: 20180512003540
MDC IDC PG IMPLANT DT: 20170516

## 2016-12-22 ENCOUNTER — Ambulatory Visit: Payer: PPO | Admitting: Physical Therapy

## 2016-12-24 ENCOUNTER — Ambulatory Visit: Payer: PPO | Admitting: Physical Therapy

## 2016-12-29 ENCOUNTER — Encounter: Payer: Self-pay | Admitting: Neurology

## 2016-12-29 ENCOUNTER — Ambulatory Visit: Payer: PPO | Admitting: Physical Therapy

## 2016-12-29 ENCOUNTER — Ambulatory Visit (INDEPENDENT_AMBULATORY_CARE_PROVIDER_SITE_OTHER): Payer: PPO | Admitting: Neurology

## 2016-12-29 ENCOUNTER — Encounter: Payer: Self-pay | Admitting: Physical Therapy

## 2016-12-29 VITALS — BP 155/77 | HR 91 | Ht 62.0 in | Wt 118.4 lb

## 2016-12-29 DIAGNOSIS — I63231 Cerebral infarction due to unspecified occlusion or stenosis of right carotid arteries: Secondary | ICD-10-CM

## 2016-12-29 DIAGNOSIS — I6381 Other cerebral infarction due to occlusion or stenosis of small artery: Secondary | ICD-10-CM

## 2016-12-29 DIAGNOSIS — R262 Difficulty in walking, not elsewhere classified: Secondary | ICD-10-CM

## 2016-12-29 DIAGNOSIS — F419 Anxiety disorder, unspecified: Secondary | ICD-10-CM

## 2016-12-29 DIAGNOSIS — I639 Cerebral infarction, unspecified: Secondary | ICD-10-CM

## 2016-12-29 DIAGNOSIS — G8194 Hemiplegia, unspecified affecting left nondominant side: Secondary | ICD-10-CM

## 2016-12-29 DIAGNOSIS — I10 Essential (primary) hypertension: Secondary | ICD-10-CM

## 2016-12-29 DIAGNOSIS — E785 Hyperlipidemia, unspecified: Secondary | ICD-10-CM | POA: Diagnosis not present

## 2016-12-29 DIAGNOSIS — M6281 Muscle weakness (generalized): Secondary | ICD-10-CM

## 2016-12-29 NOTE — Therapy (Signed)
[place], [place], [address] Phone: [phone]   Fax:  [phone]  Physical Therapy Treatment  Patient Details  Name: Stacey Ramirez MRN: [id number] Date of Birth: 04-08-1941 Referring Provider: Deland Pretty  Encounter Date: 12/29/2016      PT End of Session - 12/29/16 1423    Visit Number 5   Date for PT Re-Evaluation 01/30/17   PT Start Time 2919   PT Stop Time 1450   PT Time Calculation (min) 45 min      Past Medical History:  Diagnosis Date  . Cerebrovascular accident (stroke) ([place])    a. 12/2015 - cryptogenic.  S/P MDT Linq.  . Difficulty walking   . Essential hypertension   . History of dizziness   . Hypercholesterolemia   . Hypothyroidism   . Mitral regurgitation    a. 12/2015 Echo: EF 60-65%, mild focal basal hypertrophy. No rwma, triv AI, mild MR, mildly dil LA w/o thrombus. No RA thrombus. No PFO.  Marland Kitchen Palpitations   . Vitamin D deficiency     Past Surgical History:  Procedure Laterality Date  . CARDIOVASCULAR STRESS TEST  2002   NORMAL  . EP IMPLANTABLE DEVICE N/A 12/17/2015   Procedure: Loop Recorder Insertion;  Surgeon: [name], MD;  Location: [facility];  Service: Cardiovascular;  Laterality: N/A;  . LOOP RECORDER IMPLANT    . TEE WITHOUT CARDIOVERSION N/A 12/17/2015   Procedure: TRANSESOPHAGEAL ECHOCARDIOGRAM (TEE);  Surgeon: Lelon Perla, MD;  Location: [facility] ENDOSCOPY;  Service: Cardiovascular;  Laterality: N/A;  Pt also needs a LOOP  . TRANSTHORACIC ECHOCARDIOGRAM  2008   SHOWED LEFT VENTRICULAR HYPERTROPHY AND MILD AORTIC STENOSIS    There were no vitals filed for this visit.      Subjective Assessment - 12/29/16 1408    Subjective feel last week - so afraid of falling. Saw MD today and he feels walking fear is in my head from stroke- I asked them to test for Parkinsons.   Currently in Pain? No/denies                          [facility] Adult PT Treatment/Exercise - 12/29/16 0001      Standardized Balance Assessment   Standardized Balance Assessment Berg Balance Test     Berg Balance Test   Sit to Stand Able to stand  independently using hands   Standing Unsupported Able to stand safely 2 minutes   Sitting with Back Unsupported but Feet Supported on Floor or Stool Able to sit safely and securely 2 minutes   Stand to Sit Controls descent by using hands   Transfers Able to transfer safely, definite need of hands   Standing Unsupported with Eyes Closed Able to stand 10 seconds with supervision   Standing Ubsupported with Feet Together Able to place feet together independently and stand for 1 minute with supervision   From Standing, Reach Forward with Outstretched Arm Can reach forward >5 cm safely (2")   From Standing Position, Pick up Object from [place] to pick up shoe safely and easily   From Standing Position, Turn to Look Behind Over each Shoulder Looks behind one side only/other side shows less weight shift   Turn 360 Degrees Able to turn 360 degrees safely but slowly   Standing Unsupported, Alternately Place Feet on Step/Stool Able to complete 4 steps without aid or supervision   Standing  Unsupported, One Foot in [employer] balance while stepping or standing   Standing on One Leg Tries to lift leg/unable to hold 3 seconds but remains standing independently   Total Score 37     High Level Balance   High Level Balance Activities Side stepping;Backward walking;Marching backwards;Direction changes;Turns;Negotitating around obstacles  with ball toss     Knee/Hip Exercises: Aerobic   Nustep L 5 24mn     Knee/Hip Exercises: Machines for Strengthening   Cybex Knee Extension 10# 2 sets 15   Cybex Knee Flexion 20# 2 sets 15   Cybex Leg Press 30# 2 sets 15     Knee/Hip Exercises: Standing   Other Standing Knee Exercises alt tapping 6 inch   Other Standing Knee Exercises sit to stand with ball toss as pt has  LOB with inital standing                  PT Short Term Goals - 12/08/16 1448      PT SHORT TERM GOAL #1   Title independent with her home HEP   Baseline balacne at counter marching fwd/back,side stepping and tandem   Status Achieved           PT Long Term Goals - 12/29/16 1424      PT LONG TERM GOAL #1   Title decrease TUG time to 20 seconds   Baseline 14 sec- difficulty with turns   Status Achieved     PT LONG TERM GOAL #2   Title increase Berg balance score to 46/56   Baseline 37/56     PT LONG TERM GOAL #3   Title increase LE strength to 4/5   Status On-going     PT LONG TERM GOAL #4   Title walk 700 feet without difficulty   Status On-going               Plan - 12/29/16 1422    Clinical Impression Statement festering steps with turns and side stepping, LOB requiring assist to prevent fall. pt did well with ther ex for strength and endurance. TUG goal met.   PT Next Visit Plan advanced balance esp with turns- needs constant cuing to stay on task.      Patient will benefit from skilled therapeutic intervention in order to improve the following deficits and impairments:     Visit Diagnosis: Difficulty in walking, not elsewhere classified  Muscle weakness (generalized)  Left hemiparesis ([facility]  Anxiety     Problem List Patient Active Problem List   Diagnosis Date Noted  . Anxiety 12/29/2016  . Vascular parkinsonism ([place] 06/29/2016  . Gait disorder 04/07/2016  . Intracranial carotid stenosis 02/27/2016  . Essential hypertension   . Cerebrovascular accident (CVA) due to stenosis of right carotid artery ([place]   . Left-sided weakness 12/28/2015  . Accelerated hypertension 12/28/2015  . Headache 12/28/2015  . Adjustment reaction with anxiety 12/20/2015  . Stroke due to embolism of right middle cerebral artery ([place] 12/17/2015  . Left hemiparesis ([place]   . Cerebrovascular accident (CVA) due to occlusion of cerebral artery ([place]   .  History of CVA with residual deficit   . Prediabetes   . Right Basal ganglia infarction ([place] 12/14/2015  . Acute CVA (cerebrovascular accident) ([place] 12/14/2015  . SVT (supraventricular tachycardia) ([place] 12/14/2015  . Stroke ([place] 12/13/2015  . Stroke-like symptom 12/13/2015  . History of stroke   . Heart palpitations 09/06/2012  . Hypertension   . Hyperlipidemia   .  Hypothyroidism   . Vitamin D deficiency   . Palpitations   . History of dizziness   . LVH (left ventricular hypertrophy)   . Aortic stenosis   . Mitral regurgitation   . SOB (shortness of breath)   . Difficulty walking     Karl Erway,ANGIE PTA 12/29/2016, 2:46 PM  [place] [address] [place], [place], [address] Phone: [phone]   Fax:  [phone]  Name: CITLALI GAUTNEY MRN: [id number] Date of Birth: 1941/06/28

## 2016-12-29 NOTE — Patient Instructions (Addendum)
-   continue plavix for stroke prevention.  - would like Dr. Rexene Alberts to follow up to rule out parkinson disease. - will refer to psychology for cognitive behavior therapy.  - relaxation and de-stress are still the key to improve. Build up confidence on walking.  - Follow up with your primary care physician for stroke risk factor modification. Recommend maintain blood pressure goal <130/80, diabetes with hemoglobin A1c goal below 7.0% and lipids with LDL cholesterol goal below 70 mg/dL.  - check BP at home and record.  - continue loop recorder monitoring and so far no afib - follow up in 4 months.

## 2016-12-29 NOTE — Progress Notes (Signed)
STROKE NEUROLOGY FOLLOW UP NOTE  NAME: Stacey Ramirez DOB: 15-Mar-1941  REASON FOR VISIT: stroke follow up HISTORY FROM: pt and husband and chart  Today we had the pleasure of seeing Stacey Ramirez in follow-up at our Neurology Clinic. Pt was accompanied by husband.   History Summary Ms. Stacey Ramirez is a 76 y.o. female with history of hypertension, hyperlipidemia, hypothyroidism, aortic stenosis, mitral regurgitation, and previous stroke admitted on 12/13/15 for left hemiparesis. MRI showed acute moderate large infarct in the right BG as well as a punctate infarct periventricular WM adjacent to right occipital horn, more embolic pattern. MRA showed moderate stenosis of the supraclinoid right ICA, right P1 and left P2. CUS, TTE, DVT screening, TEE all negative. A1C 5.7 and LDL 238. Loop recorder placed. She was discharged to CIR with DAPT and crestor.   Follow up 02/27/16 - the patient has been doing well. She stayed in CIR for one week and discharged home with home PT/OT and now on outpt PT/OT. She still not able to tolerate statin and was put now on praluent. On 12/27/15 she was admitted for HA and muscle weakness. She was treated for high BP and crestor discontinued on discharge. She went back to ER on 01/01/16 due to generalized weakness, SOB and near fall. CT head showed no acute changes. Felt related to stress and anxiety. Since then, pt continue to feel easy fatigue, generalized weakness and depressed mood. She was put on zoloft. She continues to work with PT/OT and currently gradually recovering. BP today 140/78. Loop recorder so far no afib.  Follow up 06/29/16 - pt has been doing well. She still has outpt PT/OT 2/week. She stated that she felt her strength and balance did not improve over time. She did not do too much home exercise though. Her BP at home 130-150/75-85 in the morning. However, after BP meds, her BP drops, sometimes significantly. Pt felt that could be one of the reasons that she  felt tired during exercise. Her BP today 164/85 in clinic. She is on plavix. She was also on praluent but LDL not in good control, and now she is on repatha. Loop recorder no afib so far.  Follow up 09/23/16 - pt stated that she was on praluent for 5-6 months and repatha for 2 months, but her LDL still high and not coming down. Also due to mild weakness side effect, those medications were discontinued. She was put on Livalo, but still not able to tolerate due to bilateral leg weakness. For the last 2 months, she felt continued leg weakness, not able to walk well, also felt lightheadedness. However, her PT/OT was considered well and she was discharged from rehab services. In clinic, she had likely cautious gait and psychogenic gait, most consistent with anxiety and depression. BP 140/78.  Interval History During the interval time, pt continues to feel no improvement of her walking.she had PT/OT but no significant improvement. PT concerns about PD and pt was very concerned and checked with internet, found herself to have not sleeping good, voice lower than before, shuffling gait, not able to climb stairs, easily fatigued, and not enough energy. She had one remote relative had PD as she can remember. She feels dizzy when walking, especially in tight space such as grocery stores. This symptoms started after the stroke one year ago and a fall 7-8 months ago. She is on plavix without side effect. Still on zoloft 50mg  and did not see any effect. EMG/NCS showed  mild sensory motor neuropathy. Her BP 155/77 today but at home always 120-130s.   However, during the encounter, pt was able to talk normally without low voice, seems anxious and crying, husband admitted that she was anxious at home and terrified when the PT asked if she has relatives having PD.   REVIEW OF SYSTEMS: Full 14 system review of systems performed and notable only for those listed below and in HPI above, all others are negative:  Constitutional:     Cardiovascular:  Ear/Nose/Throat:   Skin:  Eyes:   Respiratory:   Gastroitestinal:   Genitourinary:  Hematology/Lymphatic:  Endocrine:  Musculoskeletal:  Walking difficulty Allergy/Immunology:   Neurological:  dizziness Psychiatric:  Sleep:   The following represents the patient's updated allergies and side effects list: Allergies  Allergen Reactions  . Sulfa Drugs Cross Reactors Itching  . Zocor [Simvastatin] Other (See Comments)    Feels like she is going to pass out, weakness  . Crestor [Rosuvastatin Calcium]     Muscle weakness  . Statins Other (See Comments)    Unable to walk or stand.  . Micardis [Telmisartan] Other (See Comments)    Feels like she is going to pass out    The neurologically relevant items on the patient's problem list were reviewed on today's visit.  Neurologic Examination  A problem focused neurological exam (12 or more points of the single system neurologic examination, vital signs counts as 1 point, cranial nerves count for 8 points) was performed.  Blood pressure (!) 155/77, pulse 91, height 5\' 2"  (1.575 m), weight 118 lb 6.4 oz (53.7 kg).  General - Well nourished, well developed, in no apparent distress.  Ophthalmologic - Sharp disc margins OU.   Cardiovascular - Regular rate and rhythm with no murmur.  Mental Status -  Level of arousal and orientation to time, place, and person were intact. Language including expression, naming, repetition, comprehension was assessed and found intact. Fund of Knowledge was assessed and was intact.  Cranial Nerves II - XII - II - Visual field intact OU. III, IV, VI - Extraocular movements intact. V - Facial sensation intact bilaterally. VII - Facial movement intact bilaterally. VIII - Hearing & vestibular intact bilaterally. X - Palate elevates symmetrically. XI - Chin turning & shoulder shrug intact bilaterally. XII - Tongue protrusion intact.  Motor Strength - The patient's strength was normal  in all extremities except mildly decreased dexterity of left hand and pronator drift was absent.  Bulk was normal and fasciculations were absent.   Motor Tone - Muscle tone was assessed at the neck and appendages and was normal, no rigidity.  Reflexes - The patient's reflexes were 1+ in all extremities and she had no pathological reflexes.  Sensory - Light touch, temperature/pinprick were assessed and were normal.    Coordination - The patient had normal movements in the hands and feet with no ataxia or dysmetria.  Tremor was absent. No cog-wheeling.   Gait and Station - stand up from chair without difficulty, however, very cautious gait, trying to hold onto things, frequent stop, no gait initiation difficulty, no shuffling gait, slow turns. Walking normally if holding husband arm. No stooped posturing.    Data reviewed: I personally reviewed the images and agree with the radiology interpretations.  Ct Head Wo Contrast 12/13/2015   No acute abnormality. Atrophy and chronic microvascular ischemic change.   Mri and Mra Head/brain Wo Cm 12/13/2015   1. Confluent acute infarct in the right basal ganglia with no  associated hemorrhage or mass effect. Superimposed punctate posterior right MCA periventricular white matter infarct.  2. Negative for emergent large vessel occlusion. Anterior circulation atherosclerosis appears stable since 2014, including at least moderate stenosis of the supraclinoid right ICA (series 705, image 6).  3. Mild progression of posterior circulation atherosclerosis since 2014 including new mild stenosis of the mid basilar artery and moderate stenosis of the right PCA P1 segment.  4. Underlying chronic small vessel ischemia, moderate for age.   LE venous doppler - Lower extremity venous duplex has been completed. Preliminary findings: No evidence of DVT or baker's cyst.  CUS - Bilateral: 1-39% ICA stenosis. Vertebral artery flow is antegrade.  TTE - Left ventricle:  The cavity size was normal. Wall thickness was  increased in a pattern of mild LVH. Systolic function was normal.  The estimated ejection fraction was in the range of 60% to 65%.  Wall motion was normal; there were no regional wall motion  abnormalities. Doppler parameters are consistent with abnormal  left ventricular relaxation (grade 1 diastolic dysfunction). - Aortic valve: There was no stenosis. - Mitral valve: There was no significant regurgitation. - Right ventricle: The cavity size was normal. Systolic function  was normal. - Pulmonary arteries: No complete TR doppler jet so unable to  estimate PA systolic pressure. - Inferior vena cava: The vessel was normal in size. The  respirophasic diameter changes were in the normal range (>= 50%),  consistent with normal central venous pressure. Impressions: - Normal LV size with mild LV hypertrophy. EF 60-65%. Normal RV  size and systolic function. No significant valvular  abnormalities.  TEE - normal LV function; negative saline microcavitation study  CT head 12/28/15 1. Expected evolution of recent right basal ganglia infarct. No hemorrhagic transformation, increased size or mass effect. 2. Background atrophy and chronic small vessel ischemia, unchanged. No new abnormality is seen.  CT head 01/01/16 1. The known right basal ganglia and right periventricular white matter infarct shown on 12/13/2015 is surprisingly indistinct on today's exam, with only very vague heterogeneity in this vicinity. No hemorrhagic transformation or acute complicating feature. 2. Old lacunar infarct of the left anterior thalamus. Chronic microvascular white matter disease. No new significant abnormality observed.  Component     Latest Ref Rng & Units 12/13/2015  Cholesterol     0 - 200 mg/dL 315 (H)  Triglycerides     <150 mg/dL 59  HDL Cholesterol     >40 mg/dL 65  Total CHOL/HDL Ratio     RATIO 4.8  VLDL     0 - 40 mg/dL 12  LDL  (calc)     0 - 99 mg/dL 238 (H)  Hemoglobin A1C     4.8 - 5.6 % 5.7 (H)  Mean Plasma Glucose     mg/dL 117  TSH     0.350 - 4.500 uIU/mL 0.911    Assessment: As you may recall, she is a 76 y.o. Caucasian female with PMH of HTN, HLD, hypothyroidism, aortic stenosis, mitral regurgitation, and previous stroke admitted on 12/13/15 for acute moderate large infarct in the right BG as well as a punctate infarct periventricular WM adjacent to right occipital horn. MRA showed severe stenosis of the supraclinoid right ICA, as well as right P1 and left P2. CUS, TTE, DVT screening, TEE all negative. A1C 5.7 and LDL 238. Loop recorder placed. She was discharged to CIR with DAPT and crestor. Pt had previous stroke in 04/2013 with small left BG infarct as well  as remote left cerebellar infarct. MRA at that time also showed right ICA supraclinoid segment severe stenosis. Therefore, the current stroke is more likely due to large vessel disease. So far loop recorder no afib. Pt had dramatic recovery and no focal deficit. However, she had post stroke fatigue and depression. On zoloft. Was put on praluent and then repatha for HLD treatment due to statin intolerance, however, LDL still not in control. Not able to tolerate Livalo. However, complains of walking difficulty, dizziness and leg weakness, but has graduated from outpt PT/OT. On exam, she likely had cautious gait and psychogenic gait, no parkinsonian signs. EMG/NCS showed mild sensory motor neuropathy, no muscle abnormalities. Her condition still consistent with anxiety/stress related with chronic subjective dizziness. Will refer to psychology. However, due to her b/l BG infarcts, will have Dr. Rexene Alberts take a look to rule out vascular parkinsonism. Pt saw Dr. Rexene Alberts 3 years ago for TIA.   Plan:  - continue plavix for stroke prevention.  - would like Dr. Rexene Alberts to follow up to rule out parkinson disease. - will refer to psychology for cognitive behavior therapy.  -  relaxation and de-stress are still the key to improve. Build up confidence on walking.  - Follow up with your primary care physician for stroke risk factor modification. Recommend maintain blood pressure goal <130/80, diabetes with hemoglobin A1c goal below 7.0% and lipids with LDL cholesterol goal below 70 mg/dL.  - check BP at home and record.  - continue loop recorder monitoring and so far no afib - follow up in 4 months.   I spent more than 25 minutes of face to face time with the patient. Greater than 50% of time was spent in counseling and coordination of care. We discussed about post stroke fatigue and depression, ddx for walking difficulty, and psychology consult.    Orders Placed This Encounter  Procedures  . Ambulatory referral to Psychology    Referral Priority:   Routine    Referral Type:   Psychiatric    Referral Reason:   Specialty Services Required    Requested Specialty:   Psychology    Number of Visits Requested:   1    No orders of the defined types were placed in this encounter.   Patient Instructions  - continue plavix for stroke prevention.  - would like Dr. Rexene Alberts to follow up to rule out parkinson disease. - will refer to psychology for cognitive behavior therapy.  - relaxation and de-stress are still the key to improve. Build up confidence on walking.  - Follow up with your primary care physician for stroke risk factor modification. Recommend maintain blood pressure goal <130/80, diabetes with hemoglobin A1c goal below 7.0% and lipids with LDL cholesterol goal below 70 mg/dL.  - check BP at home and record.  - continue loop recorder monitoring and so far no afib - follow up in 4 months.    Rosalin Hawking, MD PhD [facility] [address], [place], [place] [address] [phone]

## 2016-12-31 ENCOUNTER — Encounter: Payer: Self-pay | Admitting: Physical Therapy

## 2016-12-31 ENCOUNTER — Ambulatory Visit: Payer: PPO | Admitting: Physical Therapy

## 2016-12-31 DIAGNOSIS — G8194 Hemiplegia, unspecified affecting left nondominant side: Secondary | ICD-10-CM

## 2016-12-31 DIAGNOSIS — R262 Difficulty in walking, not elsewhere classified: Secondary | ICD-10-CM | POA: Diagnosis not present

## 2016-12-31 DIAGNOSIS — M6281 Muscle weakness (generalized): Secondary | ICD-10-CM

## 2016-12-31 NOTE — Therapy (Signed)
[place], [place], [address] Phone: [phone]   Fax:  [phone]  Physical Therapy Treatment  Patient Details  Name: Stacey Ramirez MRN: [id number] Date of Birth: 1940-09-29 Referring Provider: Deland Pretty  Encounter Date: 12/31/2016      PT End of Session - 12/31/16 1518    Visit Number 6   Date for PT Re-Evaluation 01/30/17   PT Start Time 3664   PT Stop Time 1530   PT Time Calculation (min) 45 min      Past Medical History:  Diagnosis Date  . Cerebrovascular accident (stroke) ([place])    a. 12/2015 - cryptogenic.  S/P MDT Linq.  . Difficulty walking   . Essential hypertension   . History of dizziness   . Hypercholesterolemia   . Hypothyroidism   . Mitral regurgitation    a. 12/2015 Echo: EF 60-65%, mild focal basal hypertrophy. No rwma, triv AI, mild MR, mildly dil LA w/o thrombus. No RA thrombus. No PFO.  Marland Kitchen Palpitations   . Vitamin D deficiency     Past Surgical History:  Procedure Laterality Date  . CARDIOVASCULAR STRESS TEST  2002   NORMAL  . EP IMPLANTABLE DEVICE N/A 12/17/2015   Procedure: Loop Recorder Insertion;  Surgeon: [name], MD;  Location: [facility] [facility];  Service: Cardiovascular;  Laterality: N/A;  . LOOP RECORDER IMPLANT    . TEE WITHOUT CARDIOVERSION N/A 12/17/2015   Procedure: TRANSESOPHAGEAL ECHOCARDIOGRAM (TEE);  Surgeon: Lelon Perla, MD;  Location: [facility];  Service: Cardiovascular;  Laterality: N/A;  Pt also needs a LOOP  . TRANSTHORACIC ECHOCARDIOGRAM  2008   SHOWED LEFT VENTRICULAR HYPERTROPHY AND MILD AORTIC STENOSIS    There were no vitals filed for this visit.      Subjective Assessment - 12/31/16 1444    Subjective walked in faster today   Currently in Pain? No/denies                         [facility] Adult PT Treatment/Exercise - 12/31/16 0001      Ambulation/Gait   Gait Comments gait with increased speedto  increase step length ands cuing needed     High Level Balance   High Level Balance Activities Direction changes;Negotitating around obstacles;Negotiating over obstacles;Turns     Knee/Hip Exercises: Aerobic   Nustep L 5 6 min Legs only     Knee/Hip Exercises: Machines for Strengthening   Cybex Knee Extension 10# 2 sets 15   Cybex Knee Flexion 20# 2 sets 15   Cybex Leg Press 30# 2 sets 15     Knee/Hip Exercises: Standing   Other Standing Knee Exercises resieted gait in hall way each direction blue tband     Knee/Hip Exercises: Seated   Sit to Sand without UE support;15 reps  wt ball                  PT Short Term Goals - 12/08/16 1448      PT SHORT TERM GOAL #1   Title independent with her home HEP   Baseline balacne at counter marching fwd/back,side stepping and tandem   Status Achieved           PT Long Term Goals - 12/29/16 1424      PT LONG TERM GOAL #1   Title decrease TUG time to 20 seconds   Baseline 14 sec- difficulty with turns   Status  Achieved     PT LONG TERM GOAL #2   Title increase Berg balance score to 46/56   Baseline 37/56     PT LONG TERM GOAL #3   Title increase LE strength to 4/5   Status On-going     PT LONG TERM GOAL #4   Title walk 700 feet without difficulty   Status On-going               Plan - 12/31/16 1518    Clinical Impression Statement LOB with turns at increased speed and stride requiring assistance to prevent falls.    PT Next Visit Plan gait, balance and LE strength      Patient will benefit from skilled therapeutic intervention in order to improve the following deficits and impairments:     Visit Diagnosis: Difficulty in walking, not elsewhere classified  Muscle weakness (generalized)  Left hemiparesis [place])     Problem List Patient Active Problem List   Diagnosis Date Noted  . Anxiety 12/29/2016  . 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 Gait disorder 04/07/2016  . Intracranial carotid  stenosis 02/27/2016  . Essential hypertension   . Cerebrovascular accident (CVA) due to stenosis of right carotid artery ([place])   . Left-sided weakness 12/28/2015  . Accelerated hypertension 12/28/2015  . Headache 12/28/2015  . Adjustment reaction with anxiety 12/20/2015  . Stroke due to embolism of right middle cerebral artery ([place]) 12/17/2015  . Left hemiparesis ([place])   . Cerebrovascular accident (CVA) due to occlusion of cerebral artery ([place])   . History of CVA with residual deficit   . Prediabetes   . Right Basal ganglia infarction ([place]) 12/14/2015  . Acute CVA (cerebrovascular accident) ([place]) 12/14/2015  . SVT (supraventricular tachycardia) ([place]) 12/14/2015  . Stroke ([place]) 12/13/2015  . Stroke-like symptom 12/13/2015  . History of stroke   . Heart palpitations 09/06/2012  . Hypertension   . Hyperlipidemia   . Hypothyroidism   . Vitamin D deficiency   . Palpitations   . History of dizziness   . LVH (left ventricular hypertrophy)   . Aortic stenosis   . Mitral regurgitation   . SOB (shortness of breath)   . Difficulty walking     PAYSEUR,ANGIE  PTA 12/31/2016, 3:19 PM  [place] [address] [place], [place], [address] Phone: [phone]   Fax:  [phone]  Name: Stacey Ramirez MRN: [id number] Date of Birth: 08/10/1940

## 2017-01-05 ENCOUNTER — Encounter: Payer: Self-pay | Admitting: Physical Therapy

## 2017-01-05 ENCOUNTER — Ambulatory Visit: Payer: PPO | Attending: Internal Medicine | Admitting: Physical Therapy

## 2017-01-05 DIAGNOSIS — G8194 Hemiplegia, unspecified affecting left nondominant side: Secondary | ICD-10-CM | POA: Insufficient documentation

## 2017-01-05 DIAGNOSIS — M6281 Muscle weakness (generalized): Secondary | ICD-10-CM

## 2017-01-05 DIAGNOSIS — R262 Difficulty in walking, not elsewhere classified: Secondary | ICD-10-CM | POA: Diagnosis not present

## 2017-01-05 NOTE — Therapy (Signed)
[place], [place], [address] Phone: [phone]   Fax:  [phone]  Physical Therapy Treatment  Patient Details  Name: Stacey Ramirez MRN: [id number] Date of Birth: [birth year] Referring Provider: Deland Pretty  Encounter Date: 01/05/2017      PT End of Session - 01/05/17 1415    Visit Number 7   Date for PT Re-Evaluation 01/30/17   PT Start Time 1330   PT Stop Time 1420   PT Time Calculation (min) 50 min      Past Medical History:  Diagnosis Date  . Cerebrovascular accident (stroke) ([place])    a. 12/2015 - cryptogenic.  S/P MDT Linq.  . Difficulty walking   . Essential hypertension   . History of dizziness   . Hypercholesterolemia   . Hypothyroidism   . Mitral regurgitation    a. 12/2015 Echo: EF 60-65%, mild focal basal hypertrophy. No rwma, triv AI, mild MR, mildly dil LA w/o thrombus. No RA thrombus. No PFO.  Marland Kitchen Palpitations   . Vitamin D deficiency     Past Surgical History:  Procedure Laterality Date  . CARDIOVASCULAR STRESS TEST  2002   NORMAL  . EP IMPLANTABLE DEVICE N/A 12/17/2015   Procedure: Loop Recorder Insertion;  Surgeon: [name], MD;  Location: [facility];  Service: Cardiovascular;  Laterality: N/A;  . LOOP RECORDER IMPLANT    . TEE WITHOUT CARDIOVERSION N/A 12/17/2015   Procedure: TRANSESOPHAGEAL ECHOCARDIOGRAM (TEE);  Surgeon: Lelon Perla, MD;  Location: [facility] ENDOSCOPY;  Service: Cardiovascular;  Laterality: N/A;  Pt also needs a LOOP  . TRANSTHORACIC ECHOCARDIOGRAM  2008   SHOWED LEFT VENTRICULAR HYPERTROPHY AND MILD AORTIC STENOSIS    There were no vitals filed for this visit.      Subjective Assessment - 01/05/17 1356    Subjective i have been very tired                         [facility] Adult PT Treatment/Exercise - 01/05/17 0001      Ambulation/Gait   Gait Comments gait with canes to increase arm swing and cadeance     High  Level Balance   High Level Balance Activities Side stepping;Direction changes;Marching forwards;Marching backwards;Negotitating around obstacles;Negotiating over obstacles     Knee/Hip Exercises: Aerobic   Nustep L 5 6 min Legs only     Knee/Hip Exercises: Machines for Strengthening   Cybex Knee Extension 10# 2 sets 15   Cybex Knee Flexion 20# 2 sets 15     Knee/Hip Exercises: Standing   SLS with Vectors cone taps, over sticks,directional changes and turns   Other Standing Knee Exercises stadning ball kicks                  PT Short Term Goals - 12/08/16 1448      PT SHORT TERM GOAL #1   Title independent with her home HEP   Baseline balacne at counter marching fwd/back,side stepping and tandem   Status Achieved           PT Long Term Goals - 12/29/16 1424      PT LONG TERM GOAL #1   Title decrease TUG time to 20 seconds   Baseline 14 sec- difficulty with turns   Status Achieved     PT LONG TERM GOAL #2   Title increase Berg balance score to 46/56   Baseline 37/56  PT LONG TERM GOAL #3   Title increase LE strength to 4/5   Status On-going     PT LONG TERM GOAL #4   Title walk 700 feet without difficulty   Status On-going               Plan - 01/05/17 1416    Clinical Impression Statement high level balance today multi LOB that required assistance to prevent fall. verb and tactile cuing required   PT Next Visit Plan gait, balance and LE strength      Patient will benefit from skilled therapeutic intervention in order to improve the following deficits and impairments:     Visit Diagnosis: Difficulty in walking, not elsewhere classified  Muscle weakness (generalized)  Left hemiparesis [facility])     Problem List Patient Active Problem List   Diagnosis Date Noted  . Anxiety 12/29/2016  . 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 Gait disorder 04/07/2016  . Intracranial carotid stenosis 02/27/2016  . Essential hypertension   .  Cerebrovascular accident (CVA) due to stenosis of right carotid artery ([place])   . Left-sided weakness 12/28/2015  . Accelerated hypertension 12/28/2015  . Headache 12/28/2015  . Adjustment reaction with anxiety 12/20/2015  . Stroke due to embolism of right middle cerebral artery ([place]) 12/17/2015  . Left hemiparesis ([place])   . Cerebrovascular accident (CVA) due to occlusion of cerebral artery ([place])   . History of CVA with residual deficit   . Prediabetes   . Right Basal ganglia infarction ([place]) 12/14/2015  . Acute CVA (cerebrovascular accident) ([place]) 12/14/2015  . SVT (supraventricular tachycardia) ([place]) 12/14/2015  . Stroke ([place]) 12/13/2015  . Stroke-like symptom 12/13/2015  . History of stroke   . Heart palpitations 09/06/2012  . Hypertension   . Hyperlipidemia   . Hypothyroidism   . Vitamin D deficiency   . Palpitations   . History of dizziness   . LVH (left ventricular hypertrophy)   . Aortic stenosis   . Mitral regurgitation   . SOB (shortness of breath)   . Difficulty walking     PAYSEUR,ANGIE PTA 01/05/2017, 2:20 PM  [place] [address] [place], [place], [address] Phone: [phone]   Fax:  [phone]  Name: Stacey Ramirez MRN: [id number] Date of Birth: 30-Apr-1941

## 2017-01-07 ENCOUNTER — Encounter: Payer: Self-pay | Admitting: Physical Therapy

## 2017-01-07 ENCOUNTER — Ambulatory Visit: Payer: PPO | Admitting: Physical Therapy

## 2017-01-07 DIAGNOSIS — R262 Difficulty in walking, not elsewhere classified: Secondary | ICD-10-CM | POA: Diagnosis not present

## 2017-01-07 DIAGNOSIS — G8194 Hemiplegia, unspecified affecting left nondominant side: Secondary | ICD-10-CM

## 2017-01-07 DIAGNOSIS — M6281 Muscle weakness (generalized): Secondary | ICD-10-CM

## 2017-01-07 NOTE — Therapy (Signed)
[place], [place], [address] Phone: [phone]   Fax:  [phone]  Physical Therapy Treatment  Patient Details  Name: Stacey Ramirez MRN: [id number] Date of Birth: 1940/10/10 Referring Provider: Deland Pretty  Encounter Date: 01/07/2017      PT End of Session - 01/07/17 1411    Visit Number 8   Date for PT Re-Evaluation 01/30/17   PT Start Time 1355   PT Stop Time 1440   PT Time Calculation (min) 45 min      Past Medical History:  Diagnosis Date  . Cerebrovascular accident (stroke) ([place])    a. 12/2015 - cryptogenic.  S/P MDT Linq.  . Difficulty walking   . Essential hypertension   . History of dizziness   . Hypercholesterolemia   . Hypothyroidism   . Mitral regurgitation    a. 12/2015 Echo: EF 60-65%, mild focal basal hypertrophy. No rwma, triv AI, mild MR, mildly dil LA w/o thrombus. No RA thrombus. No PFO.  Marland Kitchen Palpitations   . Vitamin D deficiency     Past Surgical History:  Procedure Laterality Date  . CARDIOVASCULAR STRESS TEST  2002   NORMAL  . EP IMPLANTABLE DEVICE N/A 12/17/2015   Procedure: Loop Recorder Insertion;  Surgeon: [name], MD;  Location: [facility];  Service: Cardiovascular;  Laterality: N/A;  . LOOP RECORDER IMPLANT    . TEE WITHOUT CARDIOVERSION N/A 12/17/2015   Procedure: TRANSESOPHAGEAL ECHOCARDIOGRAM (TEE);  Surgeon: Lelon Perla, MD;  Location: [facility] ENDOSCOPY;  Service: Cardiovascular;  Laterality: N/A;  Pt also needs a LOOP  . TRANSTHORACIC ECHOCARDIOGRAM  2008   SHOWED LEFT VENTRICULAR HYPERTROPHY AND MILD AORTIC STENOSIS    There were no vitals filed for this visit.      Subjective Assessment - 01/07/17 1408    Subjective yesterday was a great day. bakc to staggering today   Currently in Pain? No/denies                         [facility] Adult PT Treatment/Exercise - 01/07/17 0001      Ambulation/Gait   Gait Comments gait  without AD around building working on transitioning surfaces, cuing and extra time with transition without LOB  1000+ feet     High Level Balance   High Level Balance Activities Marching forwards;Marching backwards;Side stepping;Braiding  ball kicking and ball toss with directional changes     Knee/Hip Exercises: Aerobic   Nustep L 5 6 min arms/legs for recipricol mvmt     Knee/Hip Exercises: Standing   Walking with Sports Cord 20# 5 times each way   very difficult for pt and multi LOB   Other Standing Knee Exercises 3# 6 inch alt tap 20 2 sets                  PT Short Term Goals - 12/08/16 1448      PT SHORT TERM GOAL #1   Title independent with her home HEP   Baseline balacne at counter marching fwd/back,side stepping and tandem   Status Achieved           PT Long Term Goals - 01/07/17 1411      PT LONG TERM GOAL #2   Title increase Berg balance score to 46/56   Status On-going     PT LONG TERM GOAL #3   Title increase LE strength to 4/5  Status On-going     PT LONG TERM GOAL #4   Title walk 700 feet without difficulty   Baseline distance meet but difficulty transitioning surfaces   Status Partially Met               Plan - 01/07/17 1412    Clinical Impression Statement progressing with goals. improved gait outside. shuffle steps with turns and LOB with high level activities   PT Next Visit Plan gait, balance and LE strength. Pt out of town next week.      Patient will benefit from skilled therapeutic intervention in order to improve the following deficits and impairments:     Visit Diagnosis: Difficulty in walking, not elsewhere classified  Muscle weakness (generalized)  Left hemiparesis [facility])     Problem List Patient Active Problem List   Diagnosis Date Noted  . Anxiety 12/29/2016  . Vascular parkinsonism ([place]) 06/29/2016  . Gait disorder 04/07/2016  . Intracranial carotid stenosis 02/27/2016  . Essential hypertension   .  Cerebrovascular accident (CVA) due to stenosis of right carotid artery ([place])   . Left-sided weakness 12/28/2015  . Accelerated hypertension 12/28/2015  . Headache 12/28/2015  . Adjustment reaction with anxiety 12/20/2015  . Stroke due to embolism of right middle cerebral artery ([place]) 12/17/2015  . Left hemiparesis ([place])   . Cerebrovascular accident (CVA) due to occlusion of cerebral artery ([place])   . History of CVA with residual deficit   . Prediabetes   . Right Basal ganglia infarction ([place]) 12/14/2015  . Acute CVA (cerebrovascular accident) ([place]) 12/14/2015  . SVT (supraventricular tachycardia) ([place]) 12/14/2015  . Stroke ([place]) 12/13/2015  . Stroke-like symptom 12/13/2015  . History of stroke   . Heart palpitations 09/06/2012  . Hypertension   . Hyperlipidemia   . Hypothyroidism   . Vitamin D deficiency   . Palpitations   . History of dizziness   . LVH (left ventricular hypertrophy)   . Aortic stenosis   . Mitral regurgitation   . SOB (shortness of breath)   . Difficulty walking     PAYSEUR,ANGIE PTA 01/07/2017, 2:41 PM  [place] [address] [place], [place], [address] Phone: [phone]   Fax:  [phone]  Name: DAILIN SOSNOWSKI MRN: [id number] Date of Birth: 1941-03-08

## 2017-01-11 ENCOUNTER — Ambulatory Visit (INDEPENDENT_AMBULATORY_CARE_PROVIDER_SITE_OTHER): Payer: PPO | Admitting: *Deleted

## 2017-01-11 DIAGNOSIS — I63231 Cerebral infarction due to unspecified occlusion or stenosis of right carotid arteries: Secondary | ICD-10-CM | POA: Diagnosis not present

## 2017-01-11 NOTE — Progress Notes (Signed)
Carelink Summary Report / Loop Recorder 

## 2017-01-12 ENCOUNTER — Encounter: Payer: PPO | Admitting: Physical Therapy

## 2017-01-14 ENCOUNTER — Encounter: Payer: PPO | Admitting: Physical Therapy

## 2017-01-19 ENCOUNTER — Encounter: Payer: Self-pay | Admitting: Physical Therapy

## 2017-01-19 ENCOUNTER — Telehealth: Payer: Self-pay | Admitting: Neurology

## 2017-01-19 ENCOUNTER — Ambulatory Visit: Payer: PPO | Admitting: Physical Therapy

## 2017-01-19 DIAGNOSIS — R262 Difficulty in walking, not elsewhere classified: Secondary | ICD-10-CM | POA: Diagnosis not present

## 2017-01-19 DIAGNOSIS — M6281 Muscle weakness (generalized): Secondary | ICD-10-CM

## 2017-01-19 LAB — CUP PACEART REMOTE DEVICE CHECK
Date Time Interrogation Session: 20180611003756
Implantable Pulse Generator Implant Date: 20170516

## 2017-01-19 NOTE — Therapy (Signed)
[place], [place], [address] Phone: [phone]   Fax:  [phone]  Physical Therapy Treatment  Patient Details  Name: Stacey Ramirez MRN: [id number] Date of Birth: [birth year] Referring Provider: Deland Pretty  Encounter Date: 01/19/2017      PT End of Session - 01/19/17 1151    Visit Number 9   Date for PT Re-Evaluation 01/30/17   PT Start Time 1103   PT Stop Time 1149   PT Time Calculation (min) 46 min   Activity Tolerance Patient tolerated treatment well   Behavior During Therapy [facility] for tasks assessed/performed      Past Medical History:  Diagnosis Date  . Cerebrovascular accident (stroke) ([place])    a. 12/2015 - cryptogenic.  S/P MDT Linq.  . Difficulty walking   . Essential hypertension   . History of dizziness   . Hypercholesterolemia   . Hypothyroidism   . Mitral regurgitation    a. 12/2015 Echo: EF 60-65%, mild focal basal hypertrophy. No rwma, triv AI, mild MR, mildly dil LA w/o thrombus. No RA thrombus. No PFO.  Marland Kitchen Palpitations   . Vitamin D deficiency     Past Surgical History:  Procedure Laterality Date  . CARDIOVASCULAR STRESS TEST  2002   NORMAL  . EP IMPLANTABLE DEVICE N/A 12/17/2015   Procedure: Loop Recorder Insertion;  Surgeon: [name], MD;  Location: [facility];  Service: Cardiovascular;  Laterality: N/A;  . LOOP RECORDER IMPLANT    . TEE WITHOUT CARDIOVERSION N/A 12/17/2015   Procedure: TRANSESOPHAGEAL ECHOCARDIOGRAM (TEE);  Surgeon: Lelon Perla, MD;  Location: [facility];  Service: Cardiovascular;  Laterality: N/A;  Pt also needs a LOOP  . TRANSTHORACIC ECHOCARDIOGRAM  2008   SHOWED LEFT VENTRICULAR HYPERTROPHY AND MILD AORTIC STENOSIS    There were no vitals filed for this visit.      Subjective Assessment - 01/19/17 1104    Subjective Patient has been on vacation for a week.  She reports that she did pretty good the first few days but the  last few days she reports more difficulty getting up and down and walking.   Currently in Pain? No/denies                         [facility] Adult PT Treatment/Exercise - 01/19/17 0001      Ambulation/Gait   Gait Comments gait with SPC, up and down curbs, outside, went down stairs step over step, some cues for steps     High Level Balance   High Level Balance Activities Marching forwards;Marching backwards;Side stepping;Braiding;Sudden stops;Head turns;Tandem walking;Direction changes;Backward walking;Negotitating around obstacles;Negotiating over obstacles   High Level Balance Comments NBOS, eyes open, closed, pertubations     Knee/Hip Exercises: Aerobic   Nustep L 5 6 min arms/legs for recipricol mvmt     Knee/Hip Exercises: Machines for Strengthening   Cybex Knee Extension 5# 2 sets 15   Cybex Knee Flexion 20# 2 sets 15     Knee/Hip Exercises: Standing   Other Standing Knee Exercises 3# 6 inch alt tap 20 2 sets                  PT Short Term Goals - 12/08/16 1448      PT SHORT TERM GOAL #1   Title independent with her home HEP   Baseline balacne at counter marching fwd/back,side stepping and tandem   Status Achieved  PT Long Term Goals - 01/07/17 1411      PT LONG TERM GOAL #2   Title increase Berg balance score to 46/56   Status On-going     PT LONG TERM GOAL #3   Title increase LE strength to 4/5   Status On-going     PT LONG TERM GOAL #4   Title walk 700 feet without difficulty   Baseline distance meet but difficulty transitioning surfaces   Status Partially Met               Plan - 01/19/17 1152    Clinical Impression Statement Patient did well with me today with some cues for big steps and directing her on what to do, sometimes I think she over thinks things and she is a constant talker that may distract her and cause some of her festenations.  She seems to have this mostly with turning and changing directions.  When  walking if she looks around she will tend to get off track and go to the side a few steps , not in a falling way just an off course type gait.  She did well with the cane but would like to see this again before I recommend this   PT Next Visit Plan G-code next visit   Consulted and Agree with Plan of Care Patient      Patient will benefit from skilled therapeutic intervention in order to improve the following deficits and impairments:  Abnormal gait, Decreased activity tolerance, Decreased balance, Decreased mobility, Decreased strength, Dizziness, Decreased range of motion, Difficulty walking  Visit Diagnosis: Difficulty in walking, not elsewhere classified  Muscle weakness (generalized)     Problem List Patient Active Problem List   Diagnosis Date Noted  . Anxiety 12/29/2016  . Vascular parkinsonism ([place]) 06/29/2016  . Gait disorder 04/07/2016  . Intracranial carotid stenosis 02/27/2016  . Essential hypertension   . Cerebrovascular accident (CVA) due to stenosis of right carotid artery ([place])   . Left-sided weakness 12/28/2015  . Accelerated hypertension 12/28/2015  . Headache 12/28/2015  . Adjustment reaction with anxiety 12/20/2015  . Stroke due to embolism of right middle cerebral artery ([place]) 12/17/2015  . Left hemiparesis ([place])   . Cerebrovascular accident (CVA) due to occlusion of cerebral artery ([place])   . History of CVA with residual deficit   . Prediabetes   . Right Basal ganglia infarction ([place]) 12/14/2015  . Acute CVA (cerebrovascular accident) ([place]) 12/14/2015  . SVT (supraventricular tachycardia) ([place]) 12/14/2015  . Stroke ([place]) 12/13/2015  . Stroke-like symptom 12/13/2015  . History of stroke   . Heart palpitations 09/06/2012  . Hypertension   . Hyperlipidemia   . Hypothyroidism   . Vitamin D deficiency   . Palpitations   . History of dizziness   . LVH (left ventricular hypertrophy)   . Aortic stenosis   . Mitral regurgitation   . SOB (shortness of  breath)   . Difficulty walking     Sumner Boast., PT 01/19/2017, 11:55 AM  [place] [address] [place], [place], [address] Phone: [phone]   Fax:  [phone]  Name: Stacey Ramirez MRN: [id number] Date of Birth: 1941/08/01

## 2017-01-19 NOTE — Telephone Encounter (Signed)
Pt's husband called said Dr Erlinda Hong advised at last OV he would like her to see Dr Rexene Alberts. There is not a referral for this.

## 2017-01-19 NOTE — Telephone Encounter (Signed)
PT r/s to earlier appt on 03/08/2017.

## 2017-01-19 NOTE — Telephone Encounter (Signed)
appt scheduled for 8/21

## 2017-01-19 NOTE — Telephone Encounter (Signed)
Message sent to Dr.Athar.

## 2017-01-19 NOTE — Telephone Encounter (Signed)
If patient husband calls back please schedule appt with Dr. Rexene Alberts to rule out parkinson disease. The first available office visit in August 2018, this is not a new pt for Dr. Rexene Alberts. Pt has seen Dr. Rexene Alberts in the past.   LEft vm for patients husband on home phone to schedule appt with Dr. Rexene Alberts for possible rule out parkinson consult. Dr. Rexene Alberts stated pt can be schedule.

## 2017-01-19 NOTE — Telephone Encounter (Signed)
I can see her to r/o parkinsonism.

## 2017-01-21 ENCOUNTER — Encounter: Payer: Self-pay | Admitting: Physical Therapy

## 2017-01-21 ENCOUNTER — Ambulatory Visit: Payer: PPO | Admitting: Physical Therapy

## 2017-01-21 DIAGNOSIS — M6281 Muscle weakness (generalized): Secondary | ICD-10-CM

## 2017-01-21 DIAGNOSIS — R262 Difficulty in walking, not elsewhere classified: Secondary | ICD-10-CM | POA: Diagnosis not present

## 2017-01-21 DIAGNOSIS — G8194 Hemiplegia, unspecified affecting left nondominant side: Secondary | ICD-10-CM

## 2017-01-21 NOTE — Therapy (Signed)
[place], [place], [address] Phone: [phone]   Fax:  [phone]  Physical Therapy Treatment  Patient Details  Name: Stacey Ramirez MRN: [id number] Date of Birth: 1941/03/12 Referring Provider: Deland Pretty  Encounter Date: 01/21/2017      PT End of Session - 01/21/17 1431    Visit Number 10   Date for PT Re-Evaluation 01/30/17   PT Start Time 1353   PT Stop Time 1440   PT Time Calculation (min) 47 min   Activity Tolerance Patient tolerated treatment well   Behavior During Therapy [facility] for tasks assessed/performed      Past Medical History:  Diagnosis Date  . Cerebrovascular accident (stroke) ([place])    a. 12/2015 - cryptogenic.  S/P MDT Linq.  . Difficulty walking   . Essential hypertension   . History of dizziness   . Hypercholesterolemia   . Hypothyroidism   . Mitral regurgitation    a. 12/2015 Echo: EF 60-65%, mild focal basal hypertrophy. No rwma, triv AI, mild MR, mildly dil LA w/o thrombus. No RA thrombus. No PFO.  Marland Kitchen Palpitations   . Vitamin D deficiency     Past Surgical History:  Procedure Laterality Date  . CARDIOVASCULAR STRESS TEST  2002   NORMAL  . EP IMPLANTABLE DEVICE N/A 12/17/2015   Procedure: Loop Recorder Insertion;  Surgeon: [name], MD;  Location: [facility];  Service: Cardiovascular;  Laterality: N/A;  . LOOP RECORDER IMPLANT    . TEE WITHOUT CARDIOVERSION N/A 12/17/2015   Procedure: TRANSESOPHAGEAL ECHOCARDIOGRAM (TEE);  Surgeon: Lelon Perla, MD;  Location: [facility]COPY;  Service: Cardiovascular;  Laterality: N/A;  Pt also needs a LOOP  . TRANSTHORACIC ECHOCARDIOGRAM  2008   SHOWED LEFT VENTRICULAR HYPERTROPHY AND MILD AORTIC STENOSIS    There were no vitals filed for this visit.      Subjective Assessment - 01/21/17 1403    Subjective Reports that she has been doing better since our last treatment, the husband is frustrated with the fact  that  not coming here had her regress.   Currently in Pain? No/denies                         [facility] Adult PT Treatment/Exercise - 01/21/17 0001      Ambulation/Gait   Gait Comments HHA walking around the building, some decreased control down the hill and had to have 2 rest breaks     High Level Balance   High Level Balance Activities Marching forwards;Marching backwards;Side stepping;Braiding;Sudden stops;Head turns;Tandem walking;Direction changes;Backward walking;Negotitating around obstacles;Negotiating over obstacles   High Level Balance Comments NBOS, eyes open, closed, pertubations, ball tosses, ball kicks, standing on airex     Knee/Hip Exercises: Aerobic   Nustep L 5 6 min arms/legs for recipricol mvmt     Knee/Hip Exercises: Machines for Strengthening   Cybex Knee Extension 5# 2 sets 15   Cybex Knee Flexion 20# 2 sets 15     Knee/Hip Exercises: Standing   SLS with Vectors cone taps, over sticks,directional changes and turns   Other Standing Knee Exercises 3# 6 inch alt tap 20 2 sets                  PT Short Term Goals - 12/08/16 1448      PT SHORT TERM GOAL #1   Title independent with her home HEP   Baseline balacne at  counter marching fwd/back,side stepping and tandem   Status Achieved           PT Long Term Goals - 14-Feb-2017 1441      PT LONG TERM GOAL #3   Title increase LE strength to 4/5   Status Partially Met     PT LONG TERM GOAL #4   Title walk 700 feet without difficulty   Status Partially Met               Plan - 02/14/17 1434    Clinical Impression Statement Again patient had a good day with less festenation, had a few times where she had some shuffling.  Turning and stepping over items is her biggest issue   PT Next Visit Plan continue to work on turns and obstacles   Consulted and Agree with Plan of Care Patient      Patient will benefit from skilled therapeutic intervention in order to improve the  following deficits and impairments:  Abnormal gait, Decreased activity tolerance, Decreased balance, Decreased mobility, Decreased strength, Dizziness, Decreased range of motion, Difficulty walking  Visit Diagnosis: Difficulty in walking, not elsewhere classified  Muscle weakness (generalized)  Left hemiparesis ([facility])       G-Codes - Feb 14, 2017 1441    Functional Assessment Tool Used (Outpatient Only) foto 51% limitation   Functional Limitation Mobility: Walking and moving around   Mobility: Walking and Moving Around Current Status [phone]) At least 40 percent but less than 60 percent impaired, limited or restricted   Mobility: Walking and Moving Around Goal Status [phone]) At least 40 percent but less than 60 percent impaired, limited or restricted      Problem List Patient Active Problem List   Diagnosis Date Noted  . Anxiety 12/29/2016  . Vascular parkinsonism ([place]) 06/29/2016  . Gait disorder 04/07/2016  . Intracranial carotid stenosis 02/27/2016  . Essential hypertension   . Cerebrovascular accident (CVA) due to stenosis of right carotid artery ([place])   . Left-sided weakness 12/28/2015  . Accelerated hypertension 12/28/2015  . Headache 12/28/2015  . Adjustment reaction with anxiety 12/20/2015  . Stroke due to embolism of right middle cerebral artery ([place]) 12/17/2015  . Left hemiparesis ([place])   . Cerebrovascular accident (CVA) due to occlusion of cerebral artery ([place])   . History of CVA with residual deficit   . Prediabetes   . Right Basal ganglia infarction ([place]) 12/14/2015  . Acute CVA (cerebrovascular accident) ([place]) 12/14/2015  . SVT (supraventricular tachycardia) ([place]) 12/14/2015  . Stroke ([place]) 12/13/2015  . Stroke-like symptom 12/13/2015  . History of stroke   . Heart palpitations 09/06/2012  . Hypertension   . Hyperlipidemia   . Hypothyroidism   . Vitamin D deficiency   . Palpitations   . History of dizziness   . LVH (left ventricular hypertrophy)   . Aortic  stenosis   . Mitral regurgitation   . SOB (shortness of breath)   . Difficulty walking     Sumner Boast., PT 02/14/2017, 2:42 PM  [place], [place], [address] Phone: [phone]   Fax:  [phone]  Name: KELISHA DALL MRN: [id number] Date of Birth: 12/11/1940

## 2017-01-26 ENCOUNTER — Encounter: Payer: PPO | Admitting: Physical Therapy

## 2017-01-26 ENCOUNTER — Ambulatory Visit: Payer: PPO | Admitting: Physical Therapy

## 2017-01-26 ENCOUNTER — Encounter: Payer: Self-pay | Admitting: Physical Therapy

## 2017-01-26 DIAGNOSIS — M6281 Muscle weakness (generalized): Secondary | ICD-10-CM

## 2017-01-26 DIAGNOSIS — G8194 Hemiplegia, unspecified affecting left nondominant side: Secondary | ICD-10-CM

## 2017-01-26 DIAGNOSIS — R262 Difficulty in walking, not elsewhere classified: Secondary | ICD-10-CM

## 2017-01-26 NOTE — Therapy (Signed)
[place], [place], [address] Phone: [phone]   Fax:  [phone]  Physical Therapy Treatment  Patient Details  Name: ALYSAH CARTON MRN: [id number] Date of Birth: Aug 07, 1940 Referring Provider: Deland Pretty  Encounter Date: 01/26/2017      PT End of Session - 01/26/17 1242    Visit Number 11   Date for PT Re-Evaluation 01/30/17   PT Start Time 1142   PT Stop Time 1228   PT Time Calculation (min) 46 min      Past Medical History:  Diagnosis Date  . Cerebrovascular accident (stroke) ([place])    a. 12/2015 - cryptogenic.  S/P MDT Linq.  . Difficulty walking   . Essential hypertension   . History of dizziness   . Hypercholesterolemia   . Hypothyroidism   . Mitral regurgitation    a. 12/2015 Echo: EF 60-65%, mild focal basal hypertrophy. No rwma, triv AI, mild MR, mildly dil LA w/o thrombus. No RA thrombus. No PFO.  Marland Kitchen Palpitations   . Vitamin D deficiency     Past Surgical History:  Procedure Laterality Date  . CARDIOVASCULAR STRESS TEST  2002   NORMAL  . EP IMPLANTABLE DEVICE N/A 12/17/2015   Procedure: Loop Recorder Insertion;  Surgeon: [name], MD;  Location: [facility];  Service: Cardiovascular;  Laterality: N/A;  . LOOP RECORDER IMPLANT    . TEE WITHOUT CARDIOVERSION N/A 12/17/2015   Procedure: TRANSESOPHAGEAL ECHOCARDIOGRAM (TEE);  Surgeon: Lelon Perla, MD;  Location: [facility] ENDOSCOPY;  Service: Cardiovascular;  Laterality: N/A;  Pt also needs a LOOP  . TRANSTHORACIC ECHOCARDIOGRAM  2008   SHOWED LEFT VENTRICULAR HYPERTROPHY AND MILD AORTIC STENOSIS    There were no vitals filed for this visit.      Subjective Assessment - 01/26/17 1152    Subjective frustrated with several good days and then bad   Currently in Pain? No/denies                         [facility] Adult PT Treatment/Exercise - 01/26/17 0001      Ambulation/Gait   Gait Comments HHA 1000  feet with PTA "pulling" pt to increase stride     Knee/Hip Exercises: Aerobic   Elliptical 3 min R 4 I 4   Tread Mill 3 min 1.2 MPH  working on increased stride     Knee/Hip Exercises: Field seismologist for Strengthening   Cybex Leg Press 30 # 3 sets 10     Knee/Hip Exercises: Standing   Wall Squat 5 reps;1 set;5 seconds   Walking with Sports Cord 30# 5 times fwd/back and 3 times each side   Other Standing Knee Exercises HHA airex step over and back 10 times  2 sets   Other Standing Knee Exercises rocker board- min A     Knee/Hip Exercises: Seated   Sit to Sand 10 reps;without UE support  wt ball                  PT Short Term Goals - 12/08/16 1448      PT SHORT TERM GOAL #1   Title independent with her home HEP   Baseline balacne at counter marching fwd/back,side stepping and tandem   Status Achieved           PT Long Term Goals - 01/21/17 1441      PT LONG TERM GOAL #3   Title increase  LE strength to 4/5   Status Partially Met     PT LONG TERM GOAL #4   Title walk 700 feet without difficulty   Status Partially Met               Plan - 01/26/17 1243    Clinical Impression Statement focused on increased stride and step lengthes - did much better still difficulty with shuffled turns   PT Next Visit Plan continue to work on turns and obstacles      Patient will benefit from skilled therapeutic intervention in order to improve the following deficits and impairments:     Visit Diagnosis: Difficulty in walking, not elsewhere classified  Muscle weakness (generalized)  Left hemiparesis ([place])     Problem List Patient Active Problem List   Diagnosis Date Noted  . Anxiety 12/29/2016  . 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) 06/29/2016  . Gait disorder 04/07/2016  . Intracranial carotid stenosis 02/27/2016  . Essential hypertension   . Cerebrovascular accident (CVA) due to stenosis of right carotid artery ([place])   . Left-sided weakness 12/28/2015  .  Accelerated hypertension 12/28/2015  . Headache 12/28/2015  . Adjustment reaction with anxiety 12/20/2015  . Stroke due to embolism of right middle cerebral artery ([place]) 12/17/2015  . Left hemiparesis ([place])   . Cerebrovascular accident (CVA) due to occlusion of cerebral artery ([place])   . History of CVA with residual deficit   . Prediabetes   . Right Basal ganglia infarction ([place]) 12/14/2015  . Acute CVA (cerebrovascular accident) ([place]) 12/14/2015  . SVT (supraventricular tachycardia) ([place]) 12/14/2015  . Stroke ([place]) 12/13/2015  . Stroke-like symptom 12/13/2015  . History of stroke   . Heart palpitations 09/06/2012  . Hypertension   . Hyperlipidemia   . Hypothyroidism   . Vitamin D deficiency   . Palpitations   . History of dizziness   . LVH (left ventricular hypertrophy)   . Aortic stenosis   . Mitral regurgitation   . SOB (shortness of breath)   . Difficulty walking     PAYSEUR,ANGIE PTA 01/26/2017, 12:44 PM  [place], [place], [address] Phone: [phone]   Fax:  [phone]  Name: KATRICIA PREHN MRN: [id number] Date of Birth: [birth year]

## 2017-01-28 ENCOUNTER — Encounter: Payer: Self-pay | Admitting: Physical Therapy

## 2017-01-28 ENCOUNTER — Ambulatory Visit: Payer: PPO | Admitting: Physical Therapy

## 2017-01-28 DIAGNOSIS — R262 Difficulty in walking, not elsewhere classified: Secondary | ICD-10-CM

## 2017-01-28 DIAGNOSIS — G8194 Hemiplegia, unspecified affecting left nondominant side: Secondary | ICD-10-CM

## 2017-01-28 DIAGNOSIS — M6281 Muscle weakness (generalized): Secondary | ICD-10-CM

## 2017-01-28 NOTE — Therapy (Signed)
[place], [place], [address] Phone: [phone]   Fax:  [phone]  Physical Therapy Treatment  Patient Details  Name: Stacey Ramirez MRN: [id number] Date of Birth: Aug 25, 1940 Referring Provider: Deland Pretty  Encounter Date: 01/28/2017      PT End of Session - 01/28/17 1604    Visit Number 12   Date for PT Re-Evaluation 01/30/17   PT Start Time 1530   PT Stop Time 1612   PT Time Calculation (min) 42 min      Past Medical History:  Diagnosis Date  . Cerebrovascular accident (stroke) ([place])    a. 12/2015 - cryptogenic.  S/P MDT Linq.  . Difficulty walking   . Essential hypertension   . History of dizziness   . Hypercholesterolemia   . Hypothyroidism   . Mitral regurgitation    a. 12/2015 Echo: EF 60-65%, mild focal basal hypertrophy. No rwma, triv AI, mild MR, mildly dil LA w/o thrombus. No RA thrombus. No PFO.  Marland Kitchen Palpitations   . Vitamin D deficiency     Past Surgical History:  Procedure Laterality Date  . CARDIOVASCULAR STRESS TEST  2002   NORMAL  . EP IMPLANTABLE DEVICE N/A 12/17/2015   Procedure: Loop Recorder Insertion;  Surgeon: [name], MD;  Location: [facility];  Service: Cardiovascular;  Laterality: N/A;  . LOOP RECORDER IMPLANT    . TEE WITHOUT CARDIOVERSION N/A 12/17/2015   Procedure: TRANSESOPHAGEAL ECHOCARDIOGRAM (TEE);  Surgeon: Lelon Perla, MD;  Location: [facility] ENDOSCOPY;  Service: Cardiovascular;  Laterality: N/A;  Pt also needs a LOOP  . TRANSTHORACIC ECHOCARDIOGRAM  2008   SHOWED LEFT VENTRICULAR HYPERTROPHY AND MILD AORTIC STENOSIS    There were no vitals filed for this visit.      Subjective Assessment - 01/28/17 1530    Subjective not sore after last session but not doing as well- when I do strenuous stuff its like I have a set back,    Currently in Pain? No/denies                         [facility] Adult PT Treatment/Exercise -  01/28/17 0001      High Level Balance   High Level Balance Activities Head turns;Tandem walking;Negotitating around obstacles;Negotiating over obstacles;Braiding;Figure 8 turns;Marching turns   High Level Balance Comments ball toss airex     Knee/Hip Exercises: Aerobic   Nustep L 5 6 min arms/legs for recipricol mvmt     Knee/Hip Exercises: Machines for Strengthening   Cybex Knee Extension 5# 2 sets 15   Cybex Knee Flexion 20# 2 sets 15     Knee/Hip Exercises: Standing   Other Standing Knee Exercises airex 3# LE alt ex with HHA                  PT Short Term Goals - 12/08/16 1448      PT SHORT TERM GOAL #1   Title independent with her home HEP   Baseline balacne at counter marching fwd/back,side stepping and tandem   Status Achieved           PT Long Term Goals - 01/28/17 1604      PT LONG TERM GOAL #2   Status On-going     PT LONG TERM GOAL #3   Title increase LE strength to 4/5   Status Partially Met     PT LONG TERM GOAL #4  Title walk 700 feet without difficulty   Baseline distance meet but difficulty transitioning surfaces   Status Partially Met               Plan - 01/28/17 1605    Clinical Impression Statement pt with increased fatigue today . progressing with goals. pt still with decreased balance esp with turns and left leg lags behind.   PT Next Visit Plan continue to work on turns and obstacles      Patient will benefit from skilled therapeutic intervention in order to improve the following deficits and impairments:  Abnormal gait, Decreased activity tolerance, Decreased balance, Decreased mobility, Decreased strength, Dizziness, Decreased range of motion, Difficulty walking  Visit Diagnosis: Difficulty in walking, not elsewhere classified  Muscle weakness (generalized)  Left hemiparesis ([place])     Problem List Patient Active Problem List   Diagnosis Date Noted  . Anxiety 12/29/2016  . Vascular parkinsonism ([place])  06/29/2016  . Gait disorder 04/07/2016  . Intracranial carotid stenosis 02/27/2016  . Essential hypertension   . Cerebrovascular accident (CVA) due to stenosis of right carotid artery ([place])   . Left-sided weakness 12/28/2015  . Accelerated hypertension 12/28/2015  . Headache 12/28/2015  . Adjustment reaction with anxiety 12/20/2015  . Stroke due to embolism of right middle cerebral artery ([place]) 12/17/2015  . Left hemiparesis ([place])   . Cerebrovascular accident (CVA) due to occlusion of cerebral artery ([place])   . History of CVA with residual deficit   . Prediabetes   . Right Basal ganglia infarction ([place]) 12/14/2015  . Acute CVA (cerebrovascular accident) ([place]) 12/14/2015  . SVT (supraventricular tachycardia) ([place]) 12/14/2015  . Stroke ([place]) 12/13/2015  . Stroke-like symptom 12/13/2015  . History of stroke   . Heart palpitations 09/06/2012  . Hypertension   . Hyperlipidemia   . Hypothyroidism   . Vitamin D deficiency   . Palpitations   . History of dizziness   . LVH (left ventricular hypertrophy)   . Aortic stenosis   . Mitral regurgitation   . SOB (shortness of breath)   . Difficulty walking     Donovan Persley,ANGIE PTA 01/28/2017, 4:07 PM  [place], [place], [address] Phone: [phone]   Fax:  [phone]  Name: Stacey Ramirez MRN: [id number] Date of Birth: 10/25/1940

## 2017-02-08 ENCOUNTER — Ambulatory Visit (INDEPENDENT_AMBULATORY_CARE_PROVIDER_SITE_OTHER): Payer: PPO | Admitting: Psychology

## 2017-02-08 DIAGNOSIS — F4322 Adjustment disorder with anxiety: Secondary | ICD-10-CM

## 2017-02-09 ENCOUNTER — Ambulatory Visit (INDEPENDENT_AMBULATORY_CARE_PROVIDER_SITE_OTHER): Payer: PPO | Admitting: *Deleted

## 2017-02-09 ENCOUNTER — Ambulatory Visit: Payer: PPO | Attending: Internal Medicine | Admitting: Physical Therapy

## 2017-02-09 ENCOUNTER — Encounter: Payer: Self-pay | Admitting: Physical Therapy

## 2017-02-09 DIAGNOSIS — I63231 Cerebral infarction due to unspecified occlusion or stenosis of right carotid arteries: Secondary | ICD-10-CM

## 2017-02-09 DIAGNOSIS — M6281 Muscle weakness (generalized): Secondary | ICD-10-CM | POA: Diagnosis not present

## 2017-02-09 DIAGNOSIS — G8194 Hemiplegia, unspecified affecting left nondominant side: Secondary | ICD-10-CM | POA: Insufficient documentation

## 2017-02-09 DIAGNOSIS — R262 Difficulty in walking, not elsewhere classified: Secondary | ICD-10-CM | POA: Insufficient documentation

## 2017-02-09 NOTE — Therapy (Signed)
[place], [place], [address] Phone: [phone]   Fax:  [phone]  Physical Therapy Treatment  Patient Details  Name: Stacey Ramirez MRN: [id number] Date of Birth: 11-18-1940 Referring Provider: Deland Pretty  Encounter Date: 02/09/2017      PT End of Session - 02/09/17 1434    Visit Number 13   Date for PT Re-Evaluation 03/12/17   PT Start Time 1350   PT Stop Time 1442   PT Time Calculation (min) 52 min      Past Medical History:  Diagnosis Date  . Cerebrovascular accident (stroke) ([place])    a. 12/2015 - cryptogenic.  S/P MDT Linq.  . Difficulty walking   . Essential hypertension   . History of dizziness   . Hypercholesterolemia   . Hypothyroidism   . Mitral regurgitation    a. 12/2015 Echo: EF 60-65%, mild focal basal hypertrophy. No rwma, triv AI, mild MR, mildly dil LA w/o thrombus. No RA thrombus. No PFO.  Marland Kitchen Palpitations   . Vitamin D deficiency     Past Surgical History:  Procedure Laterality Date  . CARDIOVASCULAR STRESS TEST  2002   NORMAL  . EP IMPLANTABLE DEVICE N/A 12/17/2015   Procedure: Loop Recorder Insertion;  Surgeon: [name], MD;  Location: [facility];  Service: Cardiovascular;  Laterality: N/A;  . LOOP RECORDER IMPLANT    . TEE WITHOUT CARDIOVERSION N/A 12/17/2015   Procedure: TRANSESOPHAGEAL ECHOCARDIOGRAM (TEE);  Surgeon: Lelon Perla, MD;  Location: [facility] ENDOSCOPY;  Service: Cardiovascular;  Laterality: N/A;  Pt also needs a LOOP  . TRANSTHORACIC ECHOCARDIOGRAM  2008   SHOWED LEFT VENTRICULAR HYPERTROPHY AND MILD AORTIC STENOSIS    There were no vitals filed for this visit.      Subjective Assessment - 02/09/17 1355    Subjective at beach last week on vacation, had a fall and hit my tailbone/LB   Currently in Pain? Yes   Pain Score 3    Pain Location Back   Pain Orientation Lower                         [facility] Adult PT  Treatment/Exercise - 02/09/17 0001      Berg Balance Test   Sit to Stand Able to stand  independently using hands   Standing Unsupported Able to stand safely 2 minutes   Sitting with Back Unsupported but Feet Supported on Floor or Stool Able to sit safely and securely 2 minutes   Stand to Sit Controls descent by using hands   Transfers Able to transfer safely, definite need of hands   Standing Unsupported with Eyes Closed Able to stand 10 seconds with supervision   Standing Ubsupported with Feet Together Able to place feet together independently and stand for 1 minute with supervision   From Standing, Reach Forward with Outstretched Arm Can reach forward >12 cm safely (5")   From Standing Position, Pick up Object from Floor Able to pick up shoe, needs supervision   From Standing Position, Turn to Look Behind Over each Shoulder Looks behind from both sides and weight shifts well   Turn 360 Degrees Able to turn 360 degrees safely one side only in 4 seconds or less   Standing Unsupported, Alternately Place Feet on Step/Stool Able to complete 4 steps without aid or supervision   Standing Unsupported, One Foot in [employer] balance while stepping or standing  Standing on One Leg Tries to lift leg/unable to hold 3 seconds but remains standing independently   Total Score 39     High Level Balance   High Level Balance Activities Side stepping;Backward walking;Direction changes;Head turns;Tandem walking;Marching forwards;Marching backwards;Negotitating around obstacles;Negotiating over obstacles  airex and resistance     Knee/Hip Exercises: Aerobic   Elliptical 2 fwd/2 back I 8 R5  very fatiguing, visable shaking   Nustep L 5 6 min arms/legs for recipricol mvmt     Knee/Hip Exercises: Machines for Strengthening   Cybex Knee Extension 5# 2 sets 15   Cybex Knee Flexion 20# 2 sets 15                  PT Short Term Goals - 12/08/16 1448      PT SHORT TERM GOAL #1   Title  independent with her home HEP   Baseline balacne at counter marching fwd/back,side stepping and tandem   Status Achieved           PT Long Term Goals - 02/09/17 1358      PT LONG TERM GOAL #2   Title increase Berg balance score to 46/56   Baseline 39/56   Status On-going     PT LONG TERM GOAL #3   Title increase LE strength to 4/5   Baseline 4-/5 grossly tested in sitting   Status On-going     PT LONG TERM GOAL #4   Title walk 700 feet without difficulty   Status On-going               Plan - 02/09/17 1439    Clinical Impression Statement pt has been on vacation for a week, noted decrease in LE strength and pt reports fall, BERG increased by 2 pts but still short of goal and in high risk category for falls.    PT Next Visit Plan continue to work on turns and obstacles, LE strength and gait      Patient will benefit from skilled therapeutic intervention in order to improve the following deficits and impairments:     Visit Diagnosis: Difficulty in walking, not elsewhere classified  Muscle weakness (generalized)  Left hemiparesis ([place])     Problem List Patient Active Problem List   Diagnosis Date Noted  . Anxiety 12/29/2016  . Vascular parkinsonism ([place]) 06/29/2016  . Gait disorder 04/07/2016  . Intracranial carotid stenosis 02/27/2016  . Essential hypertension   . Cerebrovascular accident (CVA) due to stenosis of right carotid artery ([place])   . Left-sided weakness 12/28/2015  . Accelerated hypertension 12/28/2015  . Headache 12/28/2015  . Adjustment reaction with anxiety 12/20/2015  . Stroke due to embolism of right middle cerebral artery ([place]) 12/17/2015  . Left hemiparesis ([place])   . Cerebrovascular accident (CVA) due to occlusion of cerebral artery ([place])   . History of CVA with residual deficit   . Prediabetes   . Right Basal ganglia infarction ([place]) 12/14/2015  . Acute CVA (cerebrovascular accident) ([place]) 12/14/2015  . SVT (supraventricular  tachycardia) ([place]) 12/14/2015  . Stroke ([place]) 12/13/2015  . Stroke-like symptom 12/13/2015  . History of stroke   . Heart palpitations 09/06/2012  . Hypertension   . Hyperlipidemia   . Hypothyroidism   . Vitamin D deficiency   . Palpitations   . History of dizziness   . LVH (left ventricular hypertrophy)   . Aortic stenosis   . Mitral regurgitation   . SOB (shortness of breath)   . Difficulty walking  Fedra Lanter,ANGIE PTA 02/09/2017, 2:40 PM  [place], [place], [address] Phone: [phone]   Fax:  [phone]  Name: REGANNE MESSERSCHMIDT MRN: [id number] Date of Birth: May 15, 1941

## 2017-02-10 NOTE — Progress Notes (Signed)
Carelink Summary Report / Loop Recorder 

## 2017-02-11 ENCOUNTER — Encounter: Payer: Self-pay | Admitting: Physical Therapy

## 2017-02-11 ENCOUNTER — Ambulatory Visit: Payer: PPO | Admitting: Physical Therapy

## 2017-02-11 DIAGNOSIS — R262 Difficulty in walking, not elsewhere classified: Secondary | ICD-10-CM

## 2017-02-11 DIAGNOSIS — M6281 Muscle weakness (generalized): Secondary | ICD-10-CM

## 2017-02-11 DIAGNOSIS — G8194 Hemiplegia, unspecified affecting left nondominant side: Secondary | ICD-10-CM

## 2017-02-11 NOTE — Therapy (Signed)
[place], [place], [address] Phone: [phone]   Fax:  [phone]  Physical Therapy Treatment  Patient Details  Name: Stacey Ramirez MRN: [id number] Date of Birth: [birth year] Referring Provider: Deland Pretty  Encounter Date: 02/11/2017      PT End of Session - 02/11/17 1440    Visit Number 14   Date for PT Re-Evaluation 03/12/17   PT Start Time 1400   PT Stop Time 1455   PT Time Calculation (min) 55 min      Past Medical History:  Diagnosis Date  . Cerebrovascular accident (stroke) ([place])    a. 12/2015 - cryptogenic.  S/P MDT Linq.  . Difficulty walking   . Essential hypertension   . History of dizziness   . Hypercholesterolemia   . Hypothyroidism   . Mitral regurgitation    a. 12/2015 Echo: EF 60-65%, mild focal basal hypertrophy. No rwma, triv AI, mild MR, mildly dil LA w/o thrombus. No RA thrombus. No PFO.  Marland Kitchen Palpitations   . Vitamin D deficiency     Past Surgical History:  Procedure Laterality Date  . CARDIOVASCULAR STRESS TEST  2002   NORMAL  . EP IMPLANTABLE DEVICE N/A 12/17/2015   Procedure: Loop Recorder Insertion;  Surgeon: [name], MD;  Location: [facility];  Service: Cardiovascular;  Laterality: N/A;  . LOOP RECORDER IMPLANT    . TEE WITHOUT CARDIOVERSION N/A 12/17/2015   Procedure: TRANSESOPHAGEAL ECHOCARDIOGRAM (TEE);  Surgeon: Lelon Perla, MD;  Location: [facility] ENDOSCOPY;  Service: Cardiovascular;  Laterality: N/A;  Pt also needs a LOOP  . TRANSTHORACIC ECHOCARDIOGRAM  2008   SHOWED LEFT VENTRICULAR HYPERTROPHY AND MILD AORTIC STENOSIS    There were no vitals filed for this visit.      Subjective Assessment - 02/11/17 1404    Subjective "legs were really worked last time- it was good"   Currently in Pain? No/denies                         [facility] Adult PT Treatment/Exercise - 02/11/17 0001      Transfers   Transfers Stand Pivot  Transfers  working to decrease shuffling     Ambulation/Gait   Gait Comments amb 1000 feet working on increased cadeance and stride     High Level Balance   High Level Balance Comments ball toss with mvmt  vector stepping     Knee/Hip Exercises: Aerobic   Elliptical 2 fwd/2 back I 6 R 4  for reciprocol mvmt   Tread Mill 4 min 1.3 w/cm2  working on cadeance and stride with cuing     Knee/Hip Exercises: Machines for Strengthening   Cybex Knee Extension 5# 2 sets 15   Cybex Knee Flexion 20# 2 sets 15     Knee/Hip Exercises: Standing   Forward Step Up Both;Hand Hold: 0;Step Height: 8"  alt tap 20 times 2 sets- difficulty with left foot LOB     Knee/Hip Exercises: Seated   Ball Squeeze 20   Cl[place] with [employer] [place]   Sit to [employer] --  push/pull on stool for reciprocol mvmt                  PT Short Term Goals - 12/08/16 1448      PT SHORT TERM GOAL #1   Title independent with her home HEP   Baseline balacne at counter marching fwd/back,side stepping and  tandem   Status Achieved           PT Long Term Goals - 02/09/17 1358      PT LONG TERM GOAL #2   Title increase Berg balance score to 46/56   Baseline 39/56   Status On-going     PT LONG TERM GOAL #3   Title increase LE strength to 4/5   Baseline 4-/5 grossly tested in sitting   Status On-going     PT LONG TERM GOAL #4   Title walk 700 feet without difficulty   Status On-going               Plan - 02/11/17 1440    Clinical Impression Statement pt with decreased left leg advancement esp with fatigue and backwards stepping. with cuing pt can do stand pivot without shuffling but needs cuing.   PT Next Visit Plan continue to work on turns and obstacles, LE strength and gait      Patient will benefit from skilled therapeutic intervention in order to improve the following deficits and impairments:     Visit Diagnosis: Difficulty in walking, not elsewhere classified  Muscle weakness  (generalized)  Left hemiparesis ([place])     Problem List Patient Active Problem List   Diagnosis Date Noted  . Anxiety 12/29/2016  . Vascular parkinsonism ([place]) 06/29/2016  . Gait disorder 04/07/2016  . Intracranial carotid stenosis 02/27/2016  . Essential hypertension   . Cerebrovascular accident (CVA) due to stenosis of right carotid artery ([place])   . Left-sided weakness 12/28/2015  . Accelerated hypertension 12/28/2015  . Headache 12/28/2015  . Adjustment reaction with anxiety 12/20/2015  . Stroke due to embolism of right middle cerebral artery ([place]) 12/17/2015  . Left hemiparesis ([place])   . Cerebrovascular accident (CVA) due to occlusion of cerebral artery ([place])   . History of CVA with residual deficit   . Prediabetes   . Right Basal ganglia infarction ([place]) 12/14/2015  . Acute CVA (cerebrovascular accident) ([place]) 12/14/2015  . SVT (supraventricular tachycardia) ([place]) 12/14/2015  . Stroke ([place]) 12/13/2015  . Stroke-like symptom 12/13/2015  . History of stroke   . Heart palpitations 09/06/2012  . Hypertension   . Hyperlipidemia   . Hypothyroidism   . Vitamin D deficiency   . Palpitations   . History of dizziness   . LVH (left ventricular hypertrophy)   . Aortic stenosis   . Mitral regurgitation   . SOB (shortness of breath)   . Difficulty walking     Gracelynne Benedict,ANGIE PTA 02/11/2017, 2:42 PM  [place], [place], [address] Phone: [phone]   Fax:  [phone]  Name: WILETTA BERMINGHAM MRN: [id number] Date of Birth: 1940/12/20

## 2017-02-16 ENCOUNTER — Ambulatory Visit: Payer: PPO | Admitting: Physical Therapy

## 2017-02-16 DIAGNOSIS — R262 Difficulty in walking, not elsewhere classified: Secondary | ICD-10-CM

## 2017-02-16 DIAGNOSIS — G8194 Hemiplegia, unspecified affecting left nondominant side: Secondary | ICD-10-CM

## 2017-02-16 DIAGNOSIS — M6281 Muscle weakness (generalized): Secondary | ICD-10-CM

## 2017-02-16 NOTE — Therapy (Signed)
[place], [place], [address] Phone: [phone]   Fax:  [phone]  Physical Therapy Treatment  Patient Details  Name: Stacey Ramirez MRN: [id number] Date of Birth: November 07, 1940 Referring Provider: Deland Pretty  Encounter Date: 02/16/2017      PT End of Session - 02/16/17 1347    Visit Number 15   Date for PT Re-Evaluation 03/12/17   PT Start Time 1310   PT Stop Time 1400   PT Time Calculation (min) 50 min      Past Medical History:  Diagnosis Date  . Cerebrovascular accident (stroke) ([place])    a. 12/2015 - cryptogenic.  S/P MDT Linq.  . Difficulty walking   . Essential hypertension   . History of dizziness   . Hypercholesterolemia   . Hypothyroidism   . Mitral regurgitation    a. 12/2015 Echo: EF 60-65%, mild focal basal hypertrophy. No rwma, triv AI, mild MR, mildly dil LA w/o thrombus. No RA thrombus. No PFO.  Marland Kitchen Palpitations   . Vitamin D deficiency     Past Surgical History:  Procedure Laterality Date  . CARDIOVASCULAR STRESS TEST  2002   NORMAL  . EP IMPLANTABLE DEVICE N/A 12/17/2015   Procedure: Loop Recorder Insertion;  Surgeon: [name], MD;  Location: [facility];  Service: Cardiovascular;  Laterality: N/A;  . LOOP RECORDER IMPLANT    . TEE WITHOUT CARDIOVERSION N/A 12/17/2015   Procedure: TRANSESOPHAGEAL ECHOCARDIOGRAM (TEE);  Surgeon: Lelon Perla, MD;  Location: [facility] ENDOSCOPY;  Service: Cardiovascular;  Laterality: N/A;  Pt also needs a LOOP  . TRANSTHORACIC ECHOCARDIOGRAM  2008   SHOWED LEFT VENTRICULAR HYPERTROPHY AND MILD AORTIC STENOSIS    There were no vitals filed for this visit.      Subjective Assessment - 02/16/17 1322    Subjective walking good today, but I am sore   Currently in Pain? No/denies                         [facility] Adult PT Treatment/Exercise - 02/16/17 0001      Ambulation/Gait   Gait Comments neg over and around  bolsters  working on turns without studder steps     High Level Balance   High Level Balance Activities Negotiating over obstacles     Knee/Hip Exercises: Aerobic   Elliptical 3 fwd/3 back I 6 R 4   Nustep L 6 6 min arms/legs for recipricol mvmt     Knee/Hip Exercises: Machines for Strengthening   Cybex Knee Extension 5# 2 sets 15   Cybex Knee Flexion 20# 2 sets 15                  PT Short Term Goals - 12/08/16 1448      PT SHORT TERM GOAL #1   Title independent with her home HEP   Baseline balacne at counter marching fwd/back,side stepping and tandem   Status Achieved           PT Long Term Goals - 02/09/17 1358      PT LONG TERM GOAL #2   Title increase Berg balance score to 46/56   Baseline 39/56   Status On-going     PT LONG TERM GOAL #3   Title increase LE strength to 4/5   Baseline 4-/5 grossly tested in sitting   Status On-going     PT LONG TERM GOAL #4  Title walk 700 feet without difficulty   Status On-going               Plan - 02/16/17 1348    Clinical Impression Statement improved steps and stride and noted improvement with turns   PT Next Visit Plan continue to work on turns and obstacles, LE strength and gait      Patient will benefit from skilled therapeutic intervention in order to improve the following deficits and impairments:  Abnormal gait, Decreased activity tolerance, Decreased balance, Decreased mobility, Decreased strength, Dizziness, Decreased range of motion, Difficulty walking  Visit Diagnosis: Difficulty in walking, not elsewhere classified  Muscle weakness (generalized)  Left hemiparesis ([place])     Problem List Patient Active Problem List   Diagnosis Date Noted  . Anxiety 12/29/2016  . Vascular parkinsonism ([place]) 06/29/2016  . Gait disorder 04/07/2016  . Intracranial carotid stenosis 02/27/2016  . Essential hypertension   . Cerebrovascular accident (CVA) due to stenosis of right carotid artery ([place])    . Left-sided weakness 12/28/2015  . Accelerated hypertension 12/28/2015  . Headache 12/28/2015  . Adjustment reaction with anxiety 12/20/2015  . Stroke due to embolism of right middle cerebral artery ([place]) 12/17/2015  . Left hemiparesis ([place])   . Cerebrovascular accident (CVA) due to occlusion of cerebral artery ([place])   . History of CVA with residual deficit   . Prediabetes   . Right Basal ganglia infarction ([place]) 12/14/2015  . Acute CVA (cerebrovascular accident) ([place]) 12/14/2015  . SVT (supraventricular tachycardia) ([place]) 12/14/2015  . Stroke ([place]) 12/13/2015  . Stroke-like symptom 12/13/2015  . History of stroke   . Heart palpitations 09/06/2012  . Hypertension   . Hyperlipidemia   . Hypothyroidism   . Vitamin D deficiency   . Palpitations   . History of dizziness   . LVH (left ventricular hypertrophy)   . Aortic stenosis   . Mitral regurgitation   . SOB (shortness of breath)   . Difficulty walking     PAYSEUR,ANGIE PTA 02/16/2017, 1:49 PM  [place], [place], [address] Phone: [phone]   Fax:  [phone]  Name: Stacey Ramirez MRN: [id number] Date of Birth: Dec 12, 1940

## 2017-02-18 ENCOUNTER — Ambulatory Visit: Payer: PPO | Admitting: Physical Therapy

## 2017-02-18 ENCOUNTER — Encounter: Payer: Self-pay | Admitting: Physical Therapy

## 2017-02-18 DIAGNOSIS — G8194 Hemiplegia, unspecified affecting left nondominant side: Secondary | ICD-10-CM

## 2017-02-18 DIAGNOSIS — R262 Difficulty in walking, not elsewhere classified: Secondary | ICD-10-CM | POA: Diagnosis not present

## 2017-02-18 DIAGNOSIS — M6281 Muscle weakness (generalized): Secondary | ICD-10-CM

## 2017-02-18 NOTE — Therapy (Signed)
[facility] [place], [place], [address] Phone: [phone]   Fax:  [phone]  Physical Therapy Treatment  Patient Details  Name: Stacey Ramirez MRN: [id number] Date of Birth: 09-Nov-1940 Referring Provider: Deland Pretty  Encounter Date: 02/18/2017      PT End of Session - 02/18/17 1433    Visit Number 16   Date for PT Re-Evaluation 03/12/17   PT Start Time 2703   PT Stop Time 1448   PT Time Calculation (min) 53 min      Past Medical History:  Diagnosis Date  . Cerebrovascular accident (stroke) ([place])    a. 12/2015 - cryptogenic.  S/P MDT Linq.  . Difficulty walking   . Essential hypertension   . History of dizziness   . Hypercholesterolemia   . Hypothyroidism   . Mitral regurgitation    a. 12/2015 Echo: EF 60-65%, mild focal basal hypertrophy. No rwma, triv AI, mild MR, mildly dil LA w/o thrombus. No RA thrombus. No PFO.  Stacey Ramirez   . Vitamin D deficiency     Past Surgical History:  Procedure Laterality Date  . CARDIOVASCULAR STRESS TEST  2002   NORMAL  . EP IMPLANTABLE DEVICE N/A 12/17/2015   Procedure: Loop Recorder Insertion;  Surgeon: [name], MD;  Location: [facility];  Service: Cardiovascular;  Laterality: N/A;  . LOOP RECORDER IMPLANT    . TEE WITHOUT CARDIOVERSION N/A 12/17/2015   Procedure: TRANSESOPHAGEAL ECHOCARDIOGRAM (TEE);  Surgeon: Lelon Perla, MD;  Location: [facility] ENDOSCOPY;  Service: Cardiovascular;  Laterality: N/A;  Pt also needs a LOOP  . TRANSTHORACIC ECHOCARDIOGRAM  2008   SHOWED LEFT VENTRICULAR HYPERTROPHY AND MILD AORTIC STENOSIS    There were no vitals filed for this visit.      Subjective Assessment - 02/18/17 1358    Subjective left leg dragging a bit today   Currently in Pain? No/denies                         [facility] Adult PT Treatment/Exercise - 02/18/17 0001      High Level Balance   High Level Balance Activities  Negotiating over obstacles;Negotitating around obstacles;Direction changes   High Level Balance Comments step up on varies surfaces with directional changes, min A with LOB backward  agility ladder     Knee/Hip Exercises: Aerobic   Elliptical 3 fwd/3 back I 6 R 4   Tread Mill 1.6 mph 5 min  working on increasing stride     Knee/Hip Exercises: Seated   Long Arc Quad Both;20 reps  alt green tband   Clamshell with [employer]   Other Seated Knee/Hip Exercises green tband alt marching  2 sets 20                  PT Short Term Goals - 12/08/16 1448      PT SHORT TERM GOAL #1   Title independent with her home HEP   Baseline balacne at counter marching fwd/back,side stepping and tandem   Status Achieved           PT Long Term Goals - 02/18/17 1433      PT LONG TERM GOAL #2   Title increase Berg balance score to 46/56   Status On-going     PT LONG TERM GOAL #3   Title increase LE strength to 4/5   Status Achieved     PT LONG  TERM GOAL #4   Title walk 700 feet without difficulty   Status On-going               Plan - 02/18/17 1434    Clinical Impression Statement progressing with goals and advancing high level balance activities and coordination, LOB several times today requiring assistance to prevent fall, inconsistant righting reaction   PT Next Visit Plan continue to work on turns and obstacles, LE strength and gait      Patient will benefit from skilled therapeutic intervention in order to improve the following deficits and impairments:     Visit Diagnosis: Difficulty in walking, not elsewhere classified  Muscle weakness (generalized)  Left hemiparesis ([place])     Problem List Patient Active Problem List   Diagnosis Date Noted  . Anxiety 12/29/2016  . Vascular parkinsonism ([place]) 06/29/2016  . Gait disorder 04/07/2016  . Intracranial carotid stenosis 02/27/2016  . Essential hypertension   . Cerebrovascular accident (CVA) due to  stenosis of right carotid artery ([place])   . Left-sided weakness 12/28/2015  . Accelerated hypertension 12/28/2015  . Headache 12/28/2015  . Adjustment reaction with anxiety 12/20/2015  . Stroke due to embolism of right middle cerebral artery ([place]) 12/17/2015  . Left hemiparesis ([place])   . Cerebrovascular accident (CVA) due to occlusion of cerebral artery ([place])   . History of CVA with residual deficit   . Prediabetes   . Right Basal ganglia infarction ([place]) 12/14/2015  . Acute CVA (cerebrovascular accident) ([place]) 12/14/2015  . SVT (supraventricular tachycardia) ([place]) 12/14/2015  . Stroke ([place]) 12/13/2015  . Stroke-like symptom 12/13/2015  . History of stroke   . Heart Ramirez 09/06/2012  . Hypertension   . Hyperlipidemia   . Hypothyroidism   . Vitamin D deficiency   . [place]   . History of dizziness   . LVH (left ventricular hypertrophy)   . Aortic stenosis   . Mitral regurgitation   . SOB (shortness of breath)   . Difficulty walking     Mercy Malena,ANGIE PTA 02/18/2017, 2:35 PM  [place], [place], [address] Phone: [phone]   Fax:  [phone]  Name: Stacey Ramirez MRN: [id number] Date of Birth: 12/09/1940

## 2017-02-20 LAB — CUP PACEART REMOTE DEVICE CHECK
Implantable Pulse Generator Implant Date: 20170516
MDC IDC SESS DTM: 20180711014346

## 2017-03-02 ENCOUNTER — Encounter: Payer: Self-pay | Admitting: Physical Therapy

## 2017-03-02 ENCOUNTER — Ambulatory Visit: Payer: PPO | Admitting: Physical Therapy

## 2017-03-02 DIAGNOSIS — R262 Difficulty in walking, not elsewhere classified: Secondary | ICD-10-CM

## 2017-03-02 DIAGNOSIS — G8194 Hemiplegia, unspecified affecting left nondominant side: Secondary | ICD-10-CM

## 2017-03-02 DIAGNOSIS — M6281 Muscle weakness (generalized): Secondary | ICD-10-CM

## 2017-03-02 NOTE — Therapy (Signed)
[place], [place], [address] Phone: [phone]   Fax:  [phone]  Physical Therapy Treatment  Patient Details  Name: Stacey Ramirez MRN: [id number] Date of Birth: 20-Feb-1941 Referring Provider: Deland Pretty  Encounter Date: 03/02/2017      PT End of Session - 03/02/17 1429    Visit Number 17   Date for PT Re-Evaluation 03/12/17   PT Start Time 1400   PT Stop Time 1448   PT Time Calculation (min) 48 min      Past Medical History:  Diagnosis Date  . Cerebrovascular accident (stroke) ([place])    a. 12/2015 - cryptogenic.  S/P MDT Linq.  . Difficulty walking   . Essential hypertension   . History of dizziness   . Hypercholesterolemia   . Hypothyroidism   . Mitral regurgitation    a. 12/2015 Echo: EF 60-65%, mild focal basal hypertrophy. No rwma, triv AI, mild MR, mildly dil LA w/o thrombus. No RA thrombus. No PFO.  Marland Kitchen Palpitations   . Vitamin D deficiency     Past Surgical History:  Procedure Laterality Date  . CARDIOVASCULAR STRESS TEST  2002   NORMAL  . EP IMPLANTABLE DEVICE N/A 12/17/2015   Procedure: Loop Recorder Insertion;  Surgeon: [name], MD;  Location: [facility];  Service: Cardiovascular;  Laterality: N/A;  . LOOP RECORDER IMPLANT    . TEE WITHOUT CARDIOVERSION N/A 12/17/2015   Procedure: TRANSESOPHAGEAL ECHOCARDIOGRAM (TEE);  Surgeon: Lelon Perla, MD;  Location: [facility] ENDOSCOPY;  Service: Cardiovascular;  Laterality: N/A;  Pt also needs a LOOP  . TRANSTHORACIC ECHOCARDIOGRAM  2008   SHOWED LEFT VENTRICULAR HYPERTROPHY AND MILD AORTIC STENOSIS    There were no vitals filed for this visit.      Subjective Assessment - 03/02/17 1402    Subjective week at beach set me back with my walking- pt reports another fall at the beach without injury   Currently in Pain? No/denies                         [facility] Adult PT Treatment/Exercise - 03/02/17 0001       Ambulation/Gait   Gait Comments gait with assisted reciprocal arm mvmt 600 feet     High Level Balance   High Level Balance Activities Negotitating around obstacles;Negotiating over obstacles   High Level Balance Comments loss of balance and required cuing and assistance     Knee/Hip Exercises: Aerobic   Elliptical 3 fwd/3 back I 6 R 4   Nustep L 6 6 min arms/legs for recipricol mvmt     Knee/Hip Exercises: Machines for Strengthening   Cybex Knee Extension 5# 2 sets 15   Cybex Knee Flexion 20# 2 sets 15                  PT Short Term Goals - 12/08/16 1448      PT SHORT TERM GOAL #1   Title independent with her home HEP   Baseline balacne at counter marching fwd/back,side stepping and tandem   Status Achieved           PT Long Term Goals - 02/18/17 1433      PT LONG TERM GOAL #2   Title increase Berg balance score to 46/56   Status On-going     PT LONG TERM GOAL #3   Title increase LE strength to 4/5   Status  Achieved     PT LONG TERM GOAL #4   Title walk 700 feet without difficulty   Status On-going               Plan - 03/02/17 1429    Clinical Impression Statement regression noted in gait with being gone last week- slow gait,shuffled and difficulty with turns. Increased SOB with ex esp cardio. Discussed with pt need to ex even after PT as she sees regression with just missing 1 week.   PT Next Visit Plan continue to work on turns and obstacles, LE strength and gait      Patient will benefit from skilled therapeutic intervention in order to improve the following deficits and impairments:  Abnormal gait, Decreased activity tolerance, Decreased balance, Decreased mobility, Decreased strength, Dizziness, Decreased range of motion, Difficulty walking  Visit Diagnosis: Difficulty in walking, not elsewhere classified  Muscle weakness (generalized)  Left hemiparesis ([place])     Problem List Patient Active Problem List   Diagnosis Date  Noted  . Anxiety 12/29/2016  . Vascular parkinsonism ([place]) 06/29/2016  . Gait disorder 04/07/2016  . Intracranial carotid stenosis 02/27/2016  . Essential hypertension   . Cerebrovascular accident (CVA) due to stenosis of right carotid artery ([place])   . Left-sided weakness 12/28/2015  . Accelerated hypertension 12/28/2015  . Headache 12/28/2015  . Adjustment reaction with anxiety 12/20/2015  . Stroke due to embolism of right middle cerebral artery ([place]) 12/17/2015  . Left hemiparesis ([place])   . Cerebrovascular accident (CVA) due to occlusion of cerebral artery ([place])   . History of CVA with residual deficit   . Prediabetes   . Right Basal ganglia infarction ([place]) 12/14/2015  . Acute CVA (cerebrovascular accident) ([place]) 12/14/2015  . SVT (supraventricular tachycardia) ([place]) 12/14/2015  . Stroke ([place]) 12/13/2015  . Stroke-like symptom 12/13/2015  . History of stroke   . Heart palpitations 09/06/2012  . Hypertension   . Hyperlipidemia   . Hypothyroidism   . Vitamin D deficiency   . Palpitations   . History of dizziness   . LVH (left ventricular hypertrophy)   . Aortic stenosis   . Mitral regurgitation   . SOB (shortness of breath)   . Difficulty walking     PAYSEUR,ANGIE PTA 03/02/2017, 2:41 PM  [place] [address] [place], [place], [address] Phone: [phone]   Fax:  [phone]  Name: Stacey Ramirez MRN: [id number] Date of Birth: 31-Mar-1941

## 2017-03-03 ENCOUNTER — Ambulatory Visit (INDEPENDENT_AMBULATORY_CARE_PROVIDER_SITE_OTHER): Payer: PPO | Admitting: Psychology

## 2017-03-03 DIAGNOSIS — F4322 Adjustment disorder with anxiety: Secondary | ICD-10-CM | POA: Diagnosis not present

## 2017-03-04 ENCOUNTER — Ambulatory Visit: Payer: PPO | Attending: Internal Medicine | Admitting: Physical Therapy

## 2017-03-04 ENCOUNTER — Encounter: Payer: Self-pay | Admitting: Physical Therapy

## 2017-03-04 DIAGNOSIS — M6281 Muscle weakness (generalized): Secondary | ICD-10-CM | POA: Diagnosis not present

## 2017-03-04 DIAGNOSIS — R262 Difficulty in walking, not elsewhere classified: Secondary | ICD-10-CM | POA: Insufficient documentation

## 2017-03-04 DIAGNOSIS — F419 Anxiety disorder, unspecified: Secondary | ICD-10-CM | POA: Diagnosis not present

## 2017-03-04 DIAGNOSIS — G8194 Hemiplegia, unspecified affecting left nondominant side: Secondary | ICD-10-CM | POA: Diagnosis not present

## 2017-03-04 NOTE — Therapy (Signed)
[place], [place], [address] Phone: [phone]   Fax:  [phone]  Physical Therapy Treatment  Patient Details  Name: Stacey Ramirez MRN: [id number] Date of Birth: 12/02/1940 Referring Provider: Deland Pretty  Encounter Date: 03/04/2017      PT End of Session - 03/04/17 1451    Visit Number 18   PT Start Time 3244   PT Stop Time 0102   PT Time Calculation (min) 50 min      Past Medical History:  Diagnosis Date  . Cerebrovascular accident (stroke) ([place])    a. 12/2015 - cryptogenic.  S/P MDT Linq.  . Difficulty walking   . Essential hypertension   . History of dizziness   . Hypercholesterolemia   . Hypothyroidism   . Mitral regurgitation    a. 12/2015 Echo: EF 60-65%, mild focal basal hypertrophy. No rwma, triv AI, mild MR, mildly dil LA w/o thrombus. No RA thrombus. No PFO.  Marland Kitchen Palpitations   . Vitamin D deficiency     Past Surgical History:  Procedure Laterality Date  . CARDIOVASCULAR STRESS TEST  2002   NORMAL  . EP IMPLANTABLE DEVICE N/A 12/17/2015   Procedure: Loop Recorder Insertion;  Surgeon: [name], MD;  Location: [facility];  Service: Cardiovascular;  Laterality: N/A;  . LOOP RECORDER IMPLANT    . TEE WITHOUT CARDIOVERSION N/A 12/17/2015   Procedure: TRANSESOPHAGEAL ECHOCARDIOGRAM (TEE);  Surgeon: Lelon Perla, MD;  Location: [facility]OSCOPY;  Service: Cardiovascular;  Laterality: N/A;  Pt also needs a LOOP  . TRANSTHORACIC ECHOCARDIOGRAM  2008   SHOWED LEFT VENTRICULAR HYPERTROPHY AND MILD AORTIC STENOSIS    There were no vitals filed for this visit.      Subjective Assessment - 03/04/17 1447    Subjective RT ankle is bothering me, I think I may of turned it in one of my falls   Currently in Pain? Yes   Pain Score 2    Pain Location Ankle   Pain Orientation Right                         [facility] Adult PT Treatment/Exercise - 03/04/17 0001       Berg Balance Test   Sit to Stand Able to stand without using hands and stabilize independently   Standing Unsupported Able to stand safely 2 minutes   Sitting with Back Unsupported but Feet Supported on Floor or Stool Able to sit safely and securely 2 minutes   Stand to Sit Sits safely with minimal use of hands   Transfers Able to transfer safely, minor use of hands   Standing Unsupported with Eyes Closed Able to stand 10 seconds safely   Standing Ubsupported with Feet Together Able to place feet together independently and stand for 1 minute with supervision   From Standing, Reach Forward with Outstretched Arm Can reach forward >12 cm safely (5")   From Standing Position, Pick up Object from Floor Able to pick up shoe, needs supervision   From Standing Position, Turn to Look Behind Over each Shoulder Looks behind from both sides and weight shifts well   Turn 360 Degrees Able to turn 360 degrees safely one side only in 4 seconds or less   Standing Unsupported, Alternately Place Feet on Step/Stool Able to complete 4 steps without aid or supervision   Standing Unsupported, One Foot in [employer] balance while stepping or standing  Standing on One Leg Tries to lift leg/unable to hold 3 seconds but remains standing independently   Total Score 43     High Level Balance   High Level Balance Activities Negotitating around obstacles;Negotiating over obstacles   High Level Balance Comments static stand on BOSU, dynamic with reaching     Knee/Hip Exercises: Aerobic   Tread Mill 1. 4 mph 5 min with cuing for increased left step   Nustep L 6 6 min arms/legs for recipricol mvmt     Knee/Hip Exercises: Machines for Strengthening   Cybex Leg Press 40# 2 sets 15     Knee/Hip Exercises: Standing   SLS with Vectors x on floor   Walking with Sports Cord 20# for increased control 5 times each way                  PT Short Term Goals - 12/08/16 1448      PT SHORT TERM GOAL #1   Title  independent with her home HEP   Baseline balacne at counter marching fwd/back,side stepping and tandem   Status Achieved           PT Long Term Goals - 03/04/17 1452      PT LONG TERM GOAL #2   Title increase Berg balance score to 46/56   Baseline 43/56   Status On-going     PT LONG TERM GOAL #3   Title increase LE strength to 4/5   Status Achieved     PT LONG TERM GOAL #4   Title walk 700 feet without difficulty   Status On-going               Plan - 03/04/17 1454    Clinical Impression Statement progressing towards goals dispite noted set back after missing last week at beach. BERG increased 4 pts in 3 weeks but still in high risk category at a 43/56. Pt still benefiting from PT to maximize fucn gait and balance for safety. Discussed with pt doing renewal next week for 1 add'l month then prepare to transition to independant gym program.   PT Treatment/Interventions ADLs/Self Care Home Management;Therapeutic activities;Therapeutic exercise;Balance training;Neuromuscular re-education;Patient/family education;Functional mobility training;Gait training;Stair training   PT Next Visit Plan high level balance and gait. renewal due next week      Patient will benefit from skilled therapeutic intervention in order to improve the following deficits and impairments:  Abnormal gait, Decreased activity tolerance, Decreased balance, Decreased mobility, Decreased strength, Dizziness, Decreased range of motion, Difficulty walking  Visit Diagnosis: Difficulty in walking, not elsewhere classified  Muscle weakness (generalized)  Left hemiparesis ([place])     Problem List Patient Active Problem List   Diagnosis Date Noted  . Anxiety 12/29/2016  . Vascular parkinsonism ([place]) 06/29/2016  . Gait disorder 04/07/2016  . Intracranial carotid stenosis 02/27/2016  . Essential hypertension   . Cerebrovascular accident (CVA) due to stenosis of right carotid artery ([place])   . Left-sided  weakness 12/28/2015  . Accelerated hypertension 12/28/2015  . Headache 12/28/2015  . Adjustment reaction with anxiety 12/20/2015  . Stroke due to embolism of right middle cerebral artery ([place]) 12/17/2015  . Left hemiparesis ([place])   . Cerebrovascular accident (CVA) due to occlusion of cerebral artery ([place])   . History of CVA with residual deficit   . Prediabetes   . Right Basal ganglia infarction ([place]) 12/14/2015  . Acute CVA (cerebrovascular accident) ([place]) 12/14/2015  . SVT (supraventricular tachycardia) ([place]) 12/14/2015  . Stroke ([place]) 12/13/2015  .  Stroke-like symptom 12/13/2015  . History of stroke   . Heart palpitations 09/06/2012  . Hypertension   . Hyperlipidemia   . Hypothyroidism   . Vitamin D deficiency   . Palpitations   . History of dizziness   . LVH (left ventricular hypertrophy)   . Aortic stenosis   . Mitral regurgitation   . SOB (shortness of breath)   . Difficulty walking     PAYSEUR,ANGIE PTA 03/04/2017, 3:04 PM  [place], [place], [address] Phone: [phone]   Fax:  [phone]  Name: MADEEHA COSTANTINO MRN: [id number] Date of Birth: Mar 27, 1941

## 2017-03-08 ENCOUNTER — Encounter: Payer: Self-pay | Admitting: Neurology

## 2017-03-08 ENCOUNTER — Ambulatory Visit (INDEPENDENT_AMBULATORY_CARE_PROVIDER_SITE_OTHER): Payer: PPO | Admitting: Neurology

## 2017-03-08 VITALS — BP 142/60 | HR 92 | Ht 62.0 in | Wt 119.0 lb

## 2017-03-08 DIAGNOSIS — I639 Cerebral infarction, unspecified: Secondary | ICD-10-CM

## 2017-03-08 DIAGNOSIS — R262 Difficulty in walking, not elsewhere classified: Secondary | ICD-10-CM

## 2017-03-08 DIAGNOSIS — I6381 Other cerebral infarction due to occlusion or stenosis of small artery: Secondary | ICD-10-CM

## 2017-03-08 DIAGNOSIS — Z8673 Personal history of transient ischemic attack (TIA), and cerebral infarction without residual deficits: Secondary | ICD-10-CM | POA: Diagnosis not present

## 2017-03-08 NOTE — Patient Instructions (Addendum)
Please remember: I think you may do better with a walker for those times you feel dizzy, due to fall risk, please turn slowly, no bending down to pick anything, no heavy lifting, be extra careful at night and first thing in the morning. Also, be careful in the Bathroom and the kitchen.   Please stay well hydrated with water.   I don't see any signs of parkinsonism.   I would recommend some form of strengthening exercise at home on a daily basis; maybe a sit down type eliptical machine. I think I can see you back as needed.

## 2017-03-08 NOTE — Progress Notes (Signed)
Subjective:    Patient ID: Stacey Ramirez is a 76 y.o. female.  HPI      Interim history:  Stacey Ramirez is a very pleasant 76 year old right-handed woman with an underlying medical history of hypertension, hyperlipidemia, hypothyroidism, who presents for followup consultation of her gait disorder. The patient is accompanied her husband today. I have previously seen her on 2 occasions and last saw her on 08/17/2013, at which time we talked about her right leg weakness and recent stroke from September 2014 involving the left posterior internal capsule and perhaps left thalamic area. She reported improvement in her weakness. We talked about stroke secondary prevention and lifestyle and gait safety at the time. She was exercising regularly. I suggested as needed follow-up at the time.   Today, 03/08/2017 (all dictated new, as well as above notes, some dictation done in note pad or Word, outside of chart, may appear as copied):  She reports ongoing issues with her walking. She has been working with physical therapy. She had a brain MRI without contrast and MRA head without contrast on 12/13/2015 which I reviewed: IMPRESSION: 1. Confluent acute infarct in the right basal ganglia with no associated hemorrhage or mass effect. Superimposed punctate posterior right MCA periventricular white matter infarct. 2. Negative for emergent large vessel occlusion. Anterior circulation atherosclerosis appears stable since 2014, including at least moderate stenosis of the supraclinoid right ICA (series 705, image 6). 3. Mild progression of posterior circulation atherosclerosis since 2014 including new mild stenosis of the mid basilar artery and moderate stenosis of the right PCA P1 segment. 4. Underlying chronic small vessel ischemia, moderate for age.  She has had some falls, had another setback d/t kidney stones that she had to pass. She could not get lithotripsy as I understand as her urologist did not feel  comfortable putting her under anesthesia after her recent stroke. She works with outpatient physical therapy, goes twice a week. She admits that she does not have as much confidence in her walking and balance. She does not want to use the walker consistently as she is afraid of having used to using a walker at all times. She tries to eat well and drink enough water. She has intolerance to statins.   The patient's allergies, current medications, family history, past medical history, past social history, past surgical history and problem list were reviewed and updated as appropriate.    Previously (copied from previous notes for reference):   I first met her on 04/13/2013, and which time she felt she had a fairly recent and abrupt onset of gait disturbance since 04/08/2013. She reported right leg weakness. She had a brain MRI on 04/11/13, which showed an acute ischemic stroke in the L posterior internal capsule or corona radiata, perhaps L thalamic involvement.    She may have had a couple of lacunar stroke before in the cerebellar hemispheres b/l.    Her vascular RF include: HLP, but could not tolerate Zocor some 16 years ago, as she had leg weakness. She has HTN for years. She has a FHx of stroke, HTN, and CAD. She has had palpitations. She had an echocardiogram some 6-8 months ago.  She had a brain MRI with and without contrast on 12/30/10 which showed age-appropriate volume loss and moderate white matter changes. No acute findings at the time. At the time of her first visit in September with me I suggested that she have a echocardiogram and that she continue baby aspirin. I ordered a cardiac monitor  for her history of palpitations as well as MRA head and neck as well as blood work. Her blood work was unremarkable with the exception of elevated cholesterol. She has since been started on Crestor. She had a 30 day cardiac monitor which was not significant and MRAs were not significant enough. She had the  tests on 04/19/2013: Mildly abnormal MRA head (without) demonstrating mild, diffuse intracranial atherosclerosis. No significant focal stenosis or occlusion. Abnormal MRA neck (with and without) demonstrating: 1. The left vertebral artery has focal stenosis (~50%) near the vertebro-basilar junction.  2. The right external carotid artery has focal stenosis (~66%). 3. Bilateral internal carotid and right vertebral arteries have no stenosis.   She had a transthoracic echocardiogram on 04/28/2013: Normal LV size with mild LV hypertrophy. EF 60-65%. Normal RV size and systolic function. No significant valvular abnormalities. We called her with the test results. I referred her to physical therapy and advised to continue baby ASA.    Her Past Medical History Is Significant For: Past Medical History:  Diagnosis Date  . Cerebrovascular accident (stroke) ([place])    a. 12/2015 - cryptogenic.  S/P MDT Linq.  . Difficulty walking   . Essential hypertension   . History of dizziness   . Hypercholesterolemia   . Hypothyroidism   . Mitral regurgitation    a. 12/2015 Echo: EF 60-65%, mild focal basal hypertrophy. No rwma, triv AI, mild MR, mildly dil LA w/o thrombus. No RA thrombus. No PFO.  Marland Kitchen Palpitations   . Vitamin D deficiency     Her Past Surgical History Is Significant For: Past Surgical History:  Procedure Laterality Date  . CARDIOVASCULAR STRESS TEST  2002   NORMAL  . EP IMPLANTABLE DEVICE N/A 12/17/2015   Procedure: Loop Recorder Insertion;  Surgeon: [name], MD;  Location: [place] [facility];  Service: Cardiovascular;  Laterality: N/A;  . LOOP RECORDER IMPLANT    . TEE WITHOUT CARDIOVERSION N/A 12/17/2015   Procedure: TRANSESOPHAGEAL ECHOCARDIOGRAM (TEE);  Surgeon: Lelon Perla, MD;  Location: [facility] ENDOSCOPY;  Service: Cardiovascular;  Laterality: N/A;  Pt also needs a LOOP  . TRANSTHORACIC ECHOCARDIOGRAM  2008   SHOWED LEFT VENTRICULAR HYPERTROPHY AND MILD AORTIC STENOSIS    Her  Family History Is Significant For: Family History  Problem Relation Age of Onset  . Heart attack Mother   . Alzheimer's disease Mother   . Stroke Paternal Uncle     Her Social History Is Significant For: Social History   Social History  . Marital status: Married    Spouse name: N/A  . Number of children: 2  . Years of education: college   Occupational History  . retired    Social History Main Topics  . Smoking status: Never Smoker  . Smokeless tobacco: Never Used  . Alcohol use 0.6 oz/week    1 Glasses of wine per week     Comment: socially  . Drug use: No  . Sexual activity: Not on file   Other Topics Concern  . Not on file   Social History Narrative  . No narrative on file    Her Allergies Are:  Allergies  Allergen Reactions  . Sulfa Drugs Cross Reactors Itching  . Zocor [Simvastatin] Other (See Comments)    Feels like she is going to pass out, weakness  . Crestor [Rosuvastatin Calcium]     Muscle weakness  . Statins Other (See Comments)    Unable to walk or stand.  Stacey Ramirez [Telmisartan] Other (See  Comments)    Feels like she is going to pass out  :   Her Current Medications Are:  Outpatient Encounter Prescriptions as of 03/08/2017  Medication Sig  . ALPRAZolam (XANAX) 0.25 MG tablet Take 0.25 mg by mouth at bedtime as needed for anxiety or sleep.  . carboxymethylcellulose (REFRESH PLUS) 0.5 % SOLN Place 1 drop into both eyes 3 (three) times daily as needed.  . clopidogrel (PLAVIX) 75 MG tablet Take 1 tablet (75 mg total) by mouth daily.  Marland Kitchen docusate sodium (COLACE) 100 MG capsule Take 100 mg by mouth daily.  Marland Kitchen estradiol (ESTRACE) 0.1 MG/GM vaginal cream Place 2 g vaginally as needed (dryness).   . irbesartan (AVAPRO) 300 MG tablet Take 1 tablet (300 mg total) by mouth daily.  Marland Kitchen levothyroxine (SYNTHROID, LEVOTHROID) 25 MCG tablet Take 25 mcg by mouth daily before breakfast.   . loratadine (CLARITIN) 10 MG tablet Take 10 mg by mouth.  . LOTEMAX 0.5 %  ophthalmic suspension Place 1 drop into both eyes 4 (four) times daily as needed. For dry itchy eyes  . polyethylene glycol (MIRALAX / GLYCOLAX) packet Take 17 g by mouth daily as needed for mild constipation.  . sertraline (ZOLOFT) 50 MG tablet Take 50 mg by mouth.  Marland Kitchen amLODipine (NORVASC) 5 MG tablet Take 1 tablet (5 mg total) by mouth daily.  . [DISCONTINUED] ondansetron (ZOFRAN-ODT) 4 MG disintegrating tablet    No facility-administered encounter medications on file as of 03/08/2017.   :  Review of Systems:  Out of a complete 14 point review of systems, all are reviewed and negative with the exception of these symptoms as listed below:  Review of Systems  Neurological:       Pt presents today to discuss her difficulty walking. Pt had a stroke a year ago and since then, her walking has been difficult. Pt works with PT. Pt's legs will shake and give out.    Objective:  Neurological Exam  Physical Exam Physical Examination:   There were no vitals filed for this visit.  General Examination: The patient is a very pleasant 76 y.o. female in no acute distress. She appears well-developed and well-nourished and well groomed. Mildly anxious appearing.   HEENT: Normocephalic, atraumatic, pupils are equal, round and reactive to light and accommodation. Extraocular tracking is good without limitation to gaze excursion or nystagmus noted. Normal smooth pursuit is noted. Hearing is grossly intact. Face is symmetric with normal facial animation and normal facial sensation. Speech is clear with no dysarthria noted. There is no hypophonia. There is no lip, neck/head, jaw or voice tremor. Neck is supple with full range of passive and active motion. Oropharynx exam reveals: mild mouth dryness, adequate dental hygiene and no significant airway crowding. Mallampati is class II. Tongue protrudes centrally and palate elevates symmetrically.   Chest: Clear to auscultation without wheezing, rhonchi or crackles  noted.  Heart: S1+S2+0, regular and normal without murmurs, rubs or gallops noted.   Abdomen: Soft, non-tender and non-distended with normal bowel sounds appreciated on auscultation.  Extremities: There is no pitting edema in the distal lower extremities bilaterally. Pedal pulses are intact.  Skin: Warm and dry without trophic changes noted. There are no varicose veins.  Musculoskeletal: exam reveals no obvious joint deformities, tenderness or joint swelling or erythema.   Neurologically:  Mental status: The patient is awake, alert and oriented in all 4 spheres. Her memory, attention, language and knowledge are appropriate. There is no aphasia, agnosia, apraxia or anomia. Speech  is clear with normal prosody and enunciation. Thought process is linear. Mood is congruent and affect is increased in intensity.  Cranial nerves are as described above under HEENT exam. In addition, shoulder shrug is normal with equal shoulder height noted. Motor exam: Thin bulk, strength of 4+ out of 5 globally, slightly weaker in the left hip flexor, reflexes are 2+ throughout. Fine motor skills are globally intact for age.  Cerebellar testing shows no dysmetria or intention tremor on finger to nose testing. There is no truncal or gait ataxia.  Sensory exam is intact to light touch in the upper and lower extremities.  Gait, station and balance: she stands up with mild difficulty, requires some assistance from her husband. Stands slightly wide-based, walks slowly and cautiously, with preserved arm swing bilaterally, no shuffling, no foot drop noted.   Assessment and Plan:   In summary, Stacey Ramirez is a very pleasant 76 year old female with a history of TIA, left-sided ischemic stroke in 2014, right basal ganglia stroke in May 2017, who presents for follow-up consultation of her gait disorder and balance issues. She has been working with physical therapy as an outpatient. She feels like she is not making enough  progress after her strokes. She has had some interim setbacks including falls and kidney stones. She is advised that her exam does not suggest any serious residual weakness, she does not have any parkinsonism. Unfortunately, she is intolerant to statins and was tried again on a statin during her most recent admission last year for stroke. She is advised about secondary stroke prevention. For her gait disorder I asked her to look into some more strengthening exercises on a day-to-day basis. She may benefit from using her walker on days that she feels more off balance and dizzy. She is advised to stay well-hydrated with water and we talked about fall risk and gait safety again today.  She is encouraged to look into getting an exercise machine for home use, not a treadmill as this may cause fall risk. She may benefit from using a sitdown type elliptical machine that would help her trade upper and lower body together. She is advised to talk with her physical therapist about the different possibilities as well. She does not need any additional testing at this time from my end. I feel she is stable to continue to followup with her primary care physician.  I answered all their questions today and the patient and her husband were in agreement. I spent 35 minutes in total face-to-face time with the patient, more than 50% of which was spent in counseling and coordination of care, reviewing test results, reviewing medication and discussing or reviewing the diagnosis of gait d/o, fall risk, the prognosis and treatment options. Pertinent laboratory and imaging test results that were available during this visit with the patient were reviewed by me and considered in my medical decision making (see chart for details).

## 2017-03-09 ENCOUNTER — Ambulatory Visit: Payer: PPO | Admitting: Physical Therapy

## 2017-03-09 ENCOUNTER — Encounter: Payer: Self-pay | Admitting: Physical Therapy

## 2017-03-09 DIAGNOSIS — R262 Difficulty in walking, not elsewhere classified: Secondary | ICD-10-CM | POA: Diagnosis not present

## 2017-03-09 DIAGNOSIS — M6281 Muscle weakness (generalized): Secondary | ICD-10-CM

## 2017-03-09 DIAGNOSIS — G8194 Hemiplegia, unspecified affecting left nondominant side: Secondary | ICD-10-CM

## 2017-03-09 NOTE — Therapy (Signed)
[address] [place], [place], [address] Phone: [phone]   Fax:  [phone]  Physical Therapy Treatment  Patient Details  Name: Stacey Ramirez MRN: [id number] Date of Birth: 03/04/1941 Referring Provider: Deland Pretty  Encounter Date: 03/09/2017      PT End of Session - 03/09/17 1228    Visit Number 19   Date for PT Re-Evaluation 03/12/17   PT Start Time 1145   PT Stop Time 1235   PT Time Calculation (min) 50 min      Past Medical History:  Diagnosis Date  . Cerebrovascular accident (stroke) ([place])    a. 12/2015 - cryptogenic.  S/P MDT Linq.  . Difficulty walking   . Essential hypertension   . History of dizziness   . Hypercholesterolemia   . Hypothyroidism   . Mitral regurgitation    a. 12/2015 Echo: EF 60-65%, mild focal basal hypertrophy. No rwma, triv AI, mild MR, mildly dil LA w/o thrombus. No RA thrombus. No PFO.  Marland Kitchen Palpitations   . Vitamin D deficiency     Past Surgical History:  Procedure Laterality Date  . CARDIOVASCULAR STRESS TEST  2002   NORMAL  . EP IMPLANTABLE DEVICE N/A 12/17/2015   Procedure: Loop Recorder Insertion;  Surgeon: [name], MD;  Location: [place] [facility];  Service: Cardiovascular;  Laterality: N/A;  . LOOP RECORDER IMPLANT    . TEE WITHOUT CARDIOVERSION N/A 12/17/2015   Procedure: TRANSESOPHAGEAL ECHOCARDIOGRAM (TEE);  Surgeon: Lelon Perla, MD;  Location: [facility] ENDOSCOPY;  Service: Cardiovascular;  Laterality: N/A;  Pt also needs a LOOP  . TRANSTHORACIC ECHOCARDIOGRAM  2008   SHOWED LEFT VENTRICULAR HYPERTROPHY AND MILD AORTIC STENOSIS    There were no vitals filed for this visit.      Subjective Assessment - 03/09/17 1148    Subjective saw neuro MD yesterday and she was pleased and agreed daily ex was needed   Currently in Pain? No/denies                         [facility] Adult PT Treatment/Exercise - 03/09/17 0001      High Level  Balance   High Level Balance Activities Marching forwards;Marching backwards;Side stepping  HHA 3#     Knee/Hip Exercises: Aerobic   Elliptical 3 fwd/3 back I 7 R 5   Tread Mill 1. 4 mph 5 min with cuing for increased left step   Nustep L 6 5 min arms/legs for recipricol mvmt     Knee/Hip Exercises: Standing   Forward Step Up Both;2 sets;20 reps;Hand Hold: 1;Step Height: 8"  3# alt   Other Standing Knee Exercises BIG /reciprocal mvmt   Other Standing Knee Exercises airex, HHA 3# alt hip 3 way 20 reps     Knee/Hip Exercises: Seated   Sit to Sand 10 reps;with UE support  on airex- CGA                PT Education - 03/09/17 1210    Education provided Yes   Education Details info given for home cardio nustep and recumbant bike to mimic reciprocal mvmt   Person(s) Educated Spouse;Patient   Methods Explanation;Demonstration;Handout   Comprehension Verbalized understanding          PT Short Term Goals - 12/08/16 1448      PT SHORT TERM GOAL #1   Title independent with her home HEP   Baseline balacne at  counter marching fwd/back,side stepping and tandem   Status Achieved           PT Long Term Goals - 03/04/17 1452      PT LONG TERM GOAL #2   Title increase Berg balance score to 46/56   Baseline 43/56   Status On-going     PT LONG TERM GOAL #3   Title increase LE strength to 4/5   Status Achieved     PT LONG TERM GOAL #4   Title walk 700 feet without difficulty   Status On-going               Plan - 03/09/17 1229    Clinical Impression Statement working on BIG mvmts for reciprocal motion. weakness today and increased shuffled steps. info given on home cardio equipment.   PT Treatment/Interventions ADLs/Self Care Home Management;Therapeutic activities;Therapeutic exercise;Balance training;Neuromuscular re-education;Patient/family education;Functional mobility training;Gait training;Stair training   PT Next Visit Plan Renewal Due      Patient  will benefit from skilled therapeutic intervention in order to improve the following deficits and impairments:  Abnormal gait, Decreased activity tolerance, Decreased balance, Decreased mobility, Decreased strength, Dizziness, Decreased range of motion, Difficulty walking  Visit Diagnosis: Difficulty in walking, not elsewhere classified  Muscle weakness (generalized)  Left hemiparesis ([place])     Problem List Patient Active Problem List   Diagnosis Date Noted  . Anxiety 12/29/2016  . Vascular parkinsonism ([place]) 06/29/2016  . Gait disorder 04/07/2016  . Intracranial carotid stenosis 02/27/2016  . Essential hypertension   . Cerebrovascular accident (CVA) due to stenosis of right carotid artery ([place])   . Left-sided weakness 12/28/2015  . Accelerated hypertension 12/28/2015  . Headache 12/28/2015  . Adjustment reaction with anxiety 12/20/2015  . Stroke due to embolism of right middle cerebral artery ([place]) 12/17/2015  . Left hemiparesis ([place])   . Cerebrovascular accident (CVA) due to occlusion of cerebral artery ([place])   . History of CVA with residual deficit   . Prediabetes   . Right Basal ganglia infarction ([place]) 12/14/2015  . Acute CVA (cerebrovascular accident) ([place]) 12/14/2015  . SVT (supraventricular tachycardia) ([place]) 12/14/2015  . Stroke ([place]) 12/13/2015  . Stroke-like symptom 12/13/2015  . History of stroke   . Heart palpitations 09/06/2012  . Hypertension   . Hyperlipidemia   . Hypothyroidism   . Vitamin D deficiency   . Palpitations   . History of dizziness   . LVH (left ventricular hypertrophy)   . Aortic stenosis   . Mitral regurgitation   . SOB (shortness of breath)   . Difficulty walking     Ger Nicks,ANGIE PTA 03/09/2017, 12:30 PM  [address] [place] [place], [place], [address] Phone: [phone]   Fax:  [phone]  Name: Stacey Ramirez MRN: [id number] Date of Birth: 04-09-1941

## 2017-03-11 ENCOUNTER — Ambulatory Visit (INDEPENDENT_AMBULATORY_CARE_PROVIDER_SITE_OTHER): Payer: PPO | Admitting: *Deleted

## 2017-03-11 ENCOUNTER — Ambulatory Visit: Payer: PPO | Admitting: Physical Therapy

## 2017-03-11 DIAGNOSIS — I639 Cerebral infarction, unspecified: Secondary | ICD-10-CM | POA: Diagnosis not present

## 2017-03-15 ENCOUNTER — Ambulatory Visit: Payer: PPO | Admitting: Physical Therapy

## 2017-03-15 ENCOUNTER — Encounter: Payer: Self-pay | Admitting: Physical Therapy

## 2017-03-15 DIAGNOSIS — R262 Difficulty in walking, not elsewhere classified: Secondary | ICD-10-CM | POA: Diagnosis not present

## 2017-03-15 DIAGNOSIS — G8194 Hemiplegia, unspecified affecting left nondominant side: Secondary | ICD-10-CM

## 2017-03-15 DIAGNOSIS — M6281 Muscle weakness (generalized): Secondary | ICD-10-CM

## 2017-03-15 DIAGNOSIS — F419 Anxiety disorder, unspecified: Secondary | ICD-10-CM

## 2017-03-15 NOTE — Therapy (Signed)
[place], [place], [address] Phone: [phone]   Fax:  [phone]  Physical Therapy Treatment  Patient Details  Name: Stacey Ramirez MRN: [id number] Date of Birth: Nov 15, 1940 Referring Provider: Deland Pretty  Encounter Date: 03/15/2017      PT End of Session - 03/15/17 1433    Visit Number 20   Date for PT Re-Evaluation 03/12/17   PT Start Time 1351   PT Stop Time 1431   PT Time Calculation (min) 40 min   Activity Tolerance Patient tolerated treatment well   Behavior During Therapy [facility] for tasks assessed/performed      Past Medical History:  Diagnosis Date  . Cerebrovascular accident (stroke) ([place])    a. 12/2015 - cryptogenic.  S/P MDT Linq.  . Difficulty walking   . Essential hypertension   . History of dizziness   . Hypercholesterolemia   . Hypothyroidism   . Mitral regurgitation    a. 12/2015 Echo: EF 60-65%, mild focal basal hypertrophy. No rwma, triv AI, mild MR, mildly dil LA w/o thrombus. No RA thrombus. No PFO.  Marland Kitchen Palpitations   . Vitamin D deficiency     Past Surgical History:  Procedure Laterality Date  . CARDIOVASCULAR STRESS TEST  2002   NORMAL  . EP IMPLANTABLE DEVICE N/A 12/17/2015   Procedure: Loop Recorder Insertion;  Surgeon: [name], MD;  Location: [place] [facility];  Service: Cardiovascular;  Laterality: N/A;  . LOOP RECORDER IMPLANT    . TEE WITHOUT CARDIOVERSION N/A 12/17/2015   Procedure: TRANSESOPHAGEAL ECHOCARDIOGRAM (TEE);  Surgeon: Lelon Perla, MD;  Location: [facility] ENDOSCOPY;  Service: Cardiovascular;  Laterality: N/A;  Pt also needs a LOOP  . TRANSTHORACIC ECHOCARDIOGRAM  2008   SHOWED LEFT VENTRICULAR HYPERTROPHY AND MILD AORTIC STENOSIS    There were no vitals filed for this visit.      Subjective Assessment - 03/15/17 1353    Subjective "Fair" Pt reports no stumbles or falls   Currently in Pain? No/denies   Pain Score 0-No pain                          [facility] Adult PT Treatment/Exercise - 03/15/17 0001      High Level Balance   High Level Balance Activities Figure 8 turns;Negotiating over obstacles;Negotitating around obstacles   High Level Balance Comments Side stepping over foam roll, stepping over foam roll to aires to over foam roll      Knee/Hip Exercises: Aerobic   Elliptical 3 fwd/3 back I 7 R 5   Tread Mill 1. 4 mph 5 min with cuing for increased left step   Nustep L 4 5 min arms/legs for recipricol mvmt     Knee/Hip Exercises: Machines for Strengthening   Cybex Leg Press 40# 2 sets 15     Knee/Hip Exercises: Seated   Sit to Sand 2 sets;5 reps;with UE support  LE on airex with ball toss                  PT Short Term Goals - 12/08/16 1448      PT SHORT TERM GOAL #1   Title independent with her home HEP   Baseline balacne at counter marching fwd/back,side stepping and tandem   Status Achieved           PT Long Term Goals - 03/04/17 1452      PT LONG TERM  GOAL #2   Title increase Berg balance score to 46/56   Baseline 43/56   Status On-going     PT LONG TERM GOAL #3   Title increase LE strength to 4/5   Status Achieved     PT LONG TERM GOAL #4   Title walk 700 feet without difficulty   Status On-going               Plan - 03/15/17 1433    Clinical Impression Statement Pt ~ 6 minutes late for today's treatment. Pt fatigues after aerobic machines, cues need to take bigger step on treadmill. Pt appears to shuffle more when turning to the L noted with figure eights. Some instability when stepping over objects more so with LLE clearance.   Rehab Potential Good   PT Duration 4 weeks   PT Treatment/Interventions ADLs/Self Care Home Management;Therapeutic activities;Therapeutic exercise;Balance training;Neuromuscular re-education;Patient/family education;Functional mobility training;Gait training;Stair training   PT Next Visit Plan Balance interventions  that promote LLE clearance when stepping over objects.      Patient will benefit from skilled therapeutic intervention in order to improve the following deficits and impairments:  Abnormal gait, Decreased activity tolerance, Decreased balance, Decreased mobility, Decreased strength, Dizziness, Decreased range of motion, Difficulty walking  Visit Diagnosis: Difficulty in walking, not elsewhere classified  Muscle weakness (generalized)  Left hemiparesis [facility])  Anxiety     Problem List Patient Active Problem List   Diagnosis Date Noted  . Anxiety 12/29/2016  . Vascular parkinsonism ([place]) 06/29/2016  . Gait disorder 04/07/2016  . Intracranial carotid stenosis 02/27/2016  . Essential hypertension   . Cerebrovascular accident (CVA) due to stenosis of right carotid artery ([place])   . Left-sided weakness 12/28/2015  . Accelerated hypertension 12/28/2015  . Headache 12/28/2015  . Adjustment reaction with anxiety 12/20/2015  . Stroke due to embolism of right middle cerebral artery ([place]) 12/17/2015  . Left hemiparesis ([place])   . Cerebrovascular accident (CVA) due to occlusion of cerebral artery ([place])   . History of CVA with residual deficit   . Prediabetes   . Right Basal ganglia infarction ([place]) 12/14/2015  . Acute CVA (cerebrovascular accident) ([place]) 12/14/2015  . SVT (supraventricular tachycardia) ([place]) 12/14/2015  . Stroke ([place]) 12/13/2015  . Stroke-like symptom 12/13/2015  . History of stroke   . Heart palpitations 09/06/2012  . Hypertension   . Hyperlipidemia   . Hypothyroidism   . Vitamin D deficiency   . Palpitations   . History of dizziness   . LVH (left ventricular hypertrophy)   . Aortic stenosis   . Mitral regurgitation   . SOB (shortness of breath)   . Difficulty walking     Scot Jun 03/15/2017, 2:40 PM  [address] [place], [place], [address] Phone: [phone]   Fax:   [phone]  Name: Stacey Ramirez MRN: [id number] Date of Birth: February 14, 1941

## 2017-03-16 DIAGNOSIS — R262 Difficulty in walking, not elsewhere classified: Secondary | ICD-10-CM | POA: Diagnosis not present

## 2017-03-16 NOTE — Addendum Note (Signed)
Addended by: Sumner Boast on: 03/16/2017 08:05 AM   Modules accepted: Orders

## 2017-03-18 LAB — CUP PACEART REMOTE DEVICE CHECK
Date Time Interrogation Session: 20180810021143
MDC IDC PG IMPLANT DT: 20170516

## 2017-03-23 ENCOUNTER — Institutional Professional Consult (permissible substitution): Payer: PPO | Admitting: Neurology

## 2017-03-30 ENCOUNTER — Encounter: Payer: Self-pay | Admitting: Physical Therapy

## 2017-03-30 ENCOUNTER — Ambulatory Visit: Payer: PPO | Admitting: Physical Therapy

## 2017-03-30 DIAGNOSIS — F419 Anxiety disorder, unspecified: Secondary | ICD-10-CM

## 2017-03-30 DIAGNOSIS — R262 Difficulty in walking, not elsewhere classified: Secondary | ICD-10-CM | POA: Diagnosis not present

## 2017-03-30 DIAGNOSIS — M6281 Muscle weakness (generalized): Secondary | ICD-10-CM

## 2017-03-30 DIAGNOSIS — G8194 Hemiplegia, unspecified affecting left nondominant side: Secondary | ICD-10-CM

## 2017-03-30 NOTE — Therapy (Signed)
[place], [place], [address] Phone: [phone]   Fax:  [phone]  Physical Therapy Treatment  Patient Details  Name: Stacey Ramirez MRN: [id number] Date of Birth: Nov 21, 1940 Referring Provider: Deland Pretty  Encounter Date: 03/30/2017      PT End of Session - 03/30/17 1354    Visit Number 21   Date for PT Re-Evaluation 04/12/17   PT Start Time 1315   PT Stop Time 1400   PT Time Calculation (min) 45 min      Past Medical History:  Diagnosis Date  . Cerebrovascular accident (stroke) ([place])    a. 12/2015 - cryptogenic.  S/P MDT Linq.  . Difficulty walking   . Essential hypertension   . History of dizziness   . Hypercholesterolemia   . Hypothyroidism   . Mitral regurgitation    a. 12/2015 Echo: EF 60-65%, mild focal basal hypertrophy. No rwma, triv AI, mild MR, mildly dil LA w/o thrombus. No RA thrombus. No PFO.  Marland Kitchen Palpitations   . Vitamin D deficiency     Past Surgical History:  Procedure Laterality Date  . CARDIOVASCULAR STRESS TEST  2002   NORMAL  . EP IMPLANTABLE DEVICE N/A 12/17/2015   Procedure: Loop Recorder Insertion;  Surgeon: [name], MD;  Location: [facility];  Service: Cardiovascular;  Laterality: N/A;  . LOOP RECORDER IMPLANT    . TEE WITHOUT CARDIOVERSION N/A 12/17/2015   Procedure: TRANSESOPHAGEAL ECHOCARDIOGRAM (TEE);  Surgeon: Lelon Perla, MD;  Location: [facility] ENDOSCOPY;  Service: Cardiovascular;  Laterality: N/A;  Pt also needs a LOOP  . TRANSTHORACIC ECHOCARDIOGRAM  2008   SHOWED LEFT VENTRICULAR HYPERTROPHY AND MILD AORTIC STENOSIS    There were no vitals filed for this visit.      Subjective Assessment - 03/30/17 1317    [place] , then could not get appt. good and bad days. no falls in past 2 weeks   Currently in Pain? No/denies            [facility] PT Assessment - 03/30/17 0001      Berg Balance Test   Sit to Stand  Able to stand  independently using hands   Standing Unsupported Able to stand safely 2 minutes   Sitting with Back Unsupported but Feet Supported on Floor or Stool Able to sit safely and securely 2 minutes   Stand to Sit Controls descent by using hands   Transfers Able to transfer safely, definite need of hands   Standing Unsupported with Eyes Closed Able to stand 10 seconds with supervision   Standing Ubsupported with Feet Together Able to place feet together independently and stand for 1 minute with supervision   From Standing, Reach Forward with Outstretched Arm Can reach forward >12 cm safely (5")   From Standing Position, Pick up Object from [place] to pick up shoe safely and easily   From Standing Position, Turn to Look Behind Over each Shoulder Turn sideways only but maintains balance   Turn 360 Degrees Able to turn 360 degrees safely but slowly   Standing Unsupported, Alternately Place Feet on Step/Stool Able to complete 4 steps without aid or supervision   Standing Unsupported, One Foot in Front Able to take small step independently and hold 30 seconds   Standing on One Leg Tries to lift leg/unable to hold 3 seconds but remains standing independently   Total Score 39  [facility] Adult PT Treatment/Exercise - 03/30/17 0001      Ambulation/Gait   Gait Comments TUG no AD 15 sec  reciprocal gait activities     High Level Balance   High Level Balance Activities Negotitating around obstacles;Negotiating over obstacles;Figure 8 turns;Weight-shifting turns;Direction changes  ball toss     Knee/Hip Exercises: Aerobic   Elliptical I 6 R 4 4 min   Nustep L 5 6  min arms/legs for recipricol mvmt                  PT Short Term Goals - 12/08/16 1448      PT SHORT TERM GOAL #1   Title independent with her home HEP   Baseline balacne at counter marching fwd/back,side stepping and tandem   Status Achieved           PT Long Term Goals -  03/30/17 1332      PT LONG TERM GOAL #1   Title decrease TUG time to 20 seconds   Baseline 15 sec   Status Achieved     PT LONG TERM GOAL #2   Title increase Berg balance score to 46/56   Baseline 39/56- decreased 4 pts since 3 weeks ago and 2 weeks without PT     PT LONG TERM GOAL #3   Title increase LE strength to 4/5   Status Achieved     PT LONG TERM GOAL #4   Title walk 700 feet without difficulty   Status Partially Met               Plan - 03/30/17 1334    Clinical Impression Statement BERG 39/56, decreased 4 pts in 3 weeks and 2 weeks without PT. Progressing with goals, balance and gait still limited outside of straight aways   PT Treatment/Interventions ADLs/Self Care Home Management;Therapeutic activities;Therapeutic exercise;Balance training;Neuromuscular re-education;Patient/family education;Functional mobility training;Gait training;Stair training   PT Next Visit Plan balance      Patient will benefit from skilled therapeutic intervention in order to improve the following deficits and impairments:     Visit Diagnosis: Muscle weakness (generalized)  Left hemiparesis (HCC)  Anxiety  Difficulty in walking, not elsewhere classified     Problem List Patient Active Problem List   Diagnosis Date Noted  . Anxiety 12/29/2016  . Vascular parkinsonism ([place]) 06/29/2016  . Gait disorder 04/07/2016  . Intracranial carotid stenosis 02/27/2016  . Essential hypertension   . Cerebrovascular accident (CVA) due to stenosis of right carotid artery ([place])   . Left-sided weakness 12/28/2015  . Accelerated hypertension 12/28/2015  . Headache 12/28/2015  . Adjustment reaction with anxiety 12/20/2015  . Stroke due to embolism of right middle cerebral artery ([place]) 12/17/2015  . Left hemiparesis ([place])   . Cerebrovascular accident (CVA) due to occlusion of cerebral artery ([place])   . History of CVA with residual deficit   . Prediabetes   . Right Basal ganglia infarction  ([place]) 12/14/2015  . Acute CVA (cerebrovascular accident) ([place]) 12/14/2015  . SVT (supraventricular tachycardia) ([place]) 12/14/2015  . Stroke ([place]) 12/13/2015  . Stroke-like symptom 12/13/2015  . History of stroke   . Heart palpitations 09/06/2012  . Hypertension   . Hyperlipidemia   . Hypothyroidism   . Vitamin D deficiency   . Palpitations   . History of dizziness   . LVH (left ventricular hypertrophy)   . Aortic stenosis   . Mitral regurgitation   . SOB (shortness of breath)   . Difficulty walking  Sabre Romberger,ANGIE  PTA 03/30/2017, 1:55 PM  [place], [place], [address] Phone: [phone]   Fax:  [phone]  Name: Stacey Ramirez MRN: [id number] Date of Birth: October 09, 1940

## 2017-04-01 ENCOUNTER — Ambulatory Visit: Payer: PPO | Admitting: Physical Therapy

## 2017-04-01 ENCOUNTER — Encounter: Payer: Self-pay | Admitting: Physical Therapy

## 2017-04-01 DIAGNOSIS — G8194 Hemiplegia, unspecified affecting left nondominant side: Secondary | ICD-10-CM

## 2017-04-01 DIAGNOSIS — F419 Anxiety disorder, unspecified: Secondary | ICD-10-CM

## 2017-04-01 DIAGNOSIS — R262 Difficulty in walking, not elsewhere classified: Secondary | ICD-10-CM | POA: Diagnosis not present

## 2017-04-01 DIAGNOSIS — M6281 Muscle weakness (generalized): Secondary | ICD-10-CM

## 2017-04-01 NOTE — Therapy (Signed)
[place], [place], [address] Phone: [phone]   Fax:  [phone]  Physical Therapy Treatment  Patient Details  Name: Stacey Ramirez MRN: [id number] Date of Birth: [birth year] Referring Provider: Deland Pretty  Encounter Date: 04/01/2017      PT End of Session - 04/01/17 1424    Visit Number 22   Date for PT Re-Evaluation 04/12/17   PT Start Time 1355   PT Stop Time 1440   PT Time Calculation (min) 45 min      Past Medical History:  Diagnosis Date  . Cerebrovascular accident (stroke) ([place])    a. 12/2015 - cryptogenic.  S/P MDT Linq.  . Difficulty walking   . Essential hypertension   . History of dizziness   . Hypercholesterolemia   . Hypothyroidism   . Mitral regurgitation    a. 12/2015 Echo: EF 60-65%, mild focal basal hypertrophy. No rwma, triv AI, mild MR, mildly dil LA w/o thrombus. No RA thrombus. No PFO.  Marland Kitchen Palpitations   . Vitamin D deficiency     Past Surgical History:  Procedure Laterality Date  . CARDIOVASCULAR STRESS TEST  2002   NORMAL  . EP IMPLANTABLE DEVICE N/A 12/17/2015   Procedure: Loop Recorder Insertion;  Surgeon: [name], MD;  Location: [facility];  Service: Cardiovascular;  Laterality: N/A;  . LOOP RECORDER IMPLANT    . TEE WITHOUT CARDIOVERSION N/A 12/17/2015   Procedure: TRANSESOPHAGEAL ECHOCARDIOGRAM (TEE);  Surgeon: Lelon Perla, MD;  Location: [facility] ENDOSCOPY;  Service: Cardiovascular;  Laterality: N/A;  Pt also needs a LOOP  . TRANSTHORACIC ECHOCARDIOGRAM  2008   SHOWED LEFT VENTRICULAR HYPERTROPHY AND MILD AORTIC STENOSIS    There were no vitals filed for this visit.      Subjective Assessment - 04/01/17 1358    Subjective my joints hurt today   Currently in Pain? Yes   Pain Score 3                          [facility] Adult PT Treatment/Exercise - 04/01/17 0001      High Level Balance   High Level Balance Activities  Negotitating around obstacles;Negotiating over obstacles;Figure 8 turns;Direction changes;Backward walking;Turns     Knee/Hip Exercises: Aerobic   Stationary Bike 6 min   Nustep L 5 6  min arms/legs for recipricol mvmt     Knee/Hip Exercises: Machines for Strengthening   Cybex Knee Extension 5# 2 sets 10   Cybex Knee Flexion 20# 2 sets 15   Cybex Leg Press 40# 2 sets 15                  PT Short Term Goals - 12/08/16 1448      PT SHORT TERM GOAL #1   Title independent with her home HEP   Baseline balacne at counter marching fwd/back,side stepping and tandem   Status Achieved           PT Long Term Goals - 03/30/17 1332      PT LONG TERM GOAL #1   Title decrease TUG time to 20 seconds   Baseline 15 sec   Status Achieved     PT LONG TERM GOAL #2   Title increase Berg balance score to 46/56   Baseline 39/56- decreased 4 pts since 3 weeks ago and 2 weeks without PT     PT LONG TERM GOAL #3  Title increase LE strength to 4/5   Status Achieved     PT LONG TERM GOAL #4   Title walk 700 feet without difficulty   Status Partially Met               Plan - 04/01/17 1425    Clinical Impression Statement pt very unbalanced today, staggered steps with hesitation. pt could feel it was not a good day- pt verb busy morning and worn out   PT Treatment/Interventions ADLs/Self Care Home Management;Therapeutic activities;Therapeutic exercise;Balance training;Neuromuscular re-education;Patient/family education;Functional mobility training;Gait training;Stair training   PT Next Visit Plan pt out of town next week, assess when pt returns if need to renew or D/C      Patient will benefit from skilled therapeutic intervention in order to improve the following deficits and impairments:  Abnormal gait, Decreased activity tolerance, Decreased balance, Decreased mobility, Decreased strength, Dizziness, Decreased range of motion, Difficulty walking  Visit Diagnosis: Muscle  weakness (generalized)  Left hemiparesis (HCC)  Anxiety  Difficulty in walking, not elsewhere classified     Problem List Patient Active Problem List   Diagnosis Date Noted  . Anxiety 12/29/2016  . 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 Gait disorder 04/07/2016  . Intracranial carotid stenosis 02/27/2016  . Essential hypertension   . Cerebrovascular accident (CVA) due to stenosis of right carotid artery ([place])   . Left-sided weakness 12/28/2015  . Accelerated hypertension 12/28/2015  . Headache 12/28/2015  . Adjustment reaction with anxiety 12/20/2015  . Stroke due to embolism of right middle cerebral artery ([place]) 12/17/2015  . Left hemiparesis ([place])   . Cerebrovascular accident (CVA) due to occlusion of cerebral artery ([place])   . History of CVA with residual deficit   . Prediabetes   . Right Basal ganglia infarction ([place]) 12/14/2015  . Acute CVA (cerebrovascular accident) ([place]) 12/14/2015  . SVT (supraventricular tachycardia) ([place]) 12/14/2015  . Stroke ([place]) 12/13/2015  . Stroke-like symptom 12/13/2015  . History of stroke   . Heart palpitations 09/06/2012  . Hypertension   . Hyperlipidemia   . Hypothyroidism   . Vitamin D deficiency   . Palpitations   . History of dizziness   . LVH (left ventricular hypertrophy)   . Aortic stenosis   . Mitral regurgitation   . SOB (shortness of breath)   . Difficulty walking     Maxson Oddo,ANGIE  PTA 04/01/2017, 2:27 PM  [place], [place], [address] Phone: [phone]   Fax:  [phone]  Name: CING [place] MRN: [id number] Date of Birth: 08-02-1941

## 2017-04-08 DIAGNOSIS — I10 Essential (primary) hypertension: Secondary | ICD-10-CM | POA: Diagnosis not present

## 2017-04-12 ENCOUNTER — Ambulatory Visit (INDEPENDENT_AMBULATORY_CARE_PROVIDER_SITE_OTHER): Payer: PPO | Admitting: *Deleted

## 2017-04-12 DIAGNOSIS — I63231 Cerebral infarction due to unspecified occlusion or stenosis of right carotid arteries: Secondary | ICD-10-CM

## 2017-04-12 NOTE — Progress Notes (Signed)
Carelink Summary Report / Loop Recorder 

## 2017-04-13 ENCOUNTER — Ambulatory Visit: Payer: PPO | Admitting: Physical Therapy

## 2017-04-14 LAB — CUP PACEART REMOTE DEVICE CHECK
MDC IDC PG IMPLANT DT: 20170516
MDC IDC SESS DTM: 20180909021516

## 2017-04-15 ENCOUNTER — Ambulatory Visit: Payer: PPO | Attending: Internal Medicine | Admitting: Physical Therapy

## 2017-04-15 ENCOUNTER — Encounter: Payer: Self-pay | Admitting: Physical Therapy

## 2017-04-15 DIAGNOSIS — M6281 Muscle weakness (generalized): Secondary | ICD-10-CM

## 2017-04-15 DIAGNOSIS — G8194 Hemiplegia, unspecified affecting left nondominant side: Secondary | ICD-10-CM | POA: Diagnosis not present

## 2017-04-15 DIAGNOSIS — R262 Difficulty in walking, not elsewhere classified: Secondary | ICD-10-CM | POA: Insufficient documentation

## 2017-04-15 NOTE — Therapy (Signed)
[place], [place], [address] Phone: [phone]   Fax:  [phone]  Physical Therapy Treatment  Patient Details  Name: Stacey Ramirez MRN: [id number] Date of Birth: Jun 05, 1941 Referring Provider: Deland Pretty  Encounter Date: 04/15/2017      PT End of Session - 04/15/17 1441    Visit Number 23   Date for PT Re-Evaluation 04/12/17   PT Start Time 3810   PT Stop Time 1445   PT Time Calculation (min) 40 min      Past Medical History:  Diagnosis Date  . Cerebrovascular accident (stroke) ([place])    a. 12/2015 - cryptogenic.  S/P MDT Linq.  . Difficulty walking   . Essential hypertension   . History of dizziness   . Hypercholesterolemia   . Hypothyroidism   . Mitral regurgitation    a. 12/2015 Echo: EF 60-65%, mild focal basal hypertrophy. No rwma, triv AI, mild MR, mildly dil LA w/o thrombus. No RA thrombus. No PFO.  Marland Kitchen Palpitations   . Vitamin D deficiency     Past Surgical History:  Procedure Laterality Date  . CARDIOVASCULAR STRESS TEST  2002   NORMAL  . EP IMPLANTABLE DEVICE N/A 12/17/2015   Procedure: Loop Recorder Insertion;  Surgeon: [name], MD;  Location: [place] [facility];  Service: Cardiovascular;  Laterality: N/A;  . LOOP RECORDER IMPLANT    . TEE WITHOUT CARDIOVERSION N/A 12/17/2015   Procedure: TRANSESOPHAGEAL ECHOCARDIOGRAM (TEE);  Surgeon: Lelon Perla, MD;  Location: [facility] ENDOSCOPY;  Service: Cardiovascular;  Laterality: N/A;  Pt also needs a LOOP  . TRANSTHORACIC ECHOCARDIOGRAM  2008   SHOWED LEFT VENTRICULAR HYPERTROPHY AND MILD AORTIC STENOSIS    There were no vitals filed for this visit.      Subjective Assessment - 04/15/17 1415    Subjective just worn out[place]. Pt c/o increased SOB and going to f/u with cardio MD   Currently in Pain? No/denies                         [facility] Adult PT  Treatment/Exercise - 04/15/17 0001      High Level Balance   High Level Balance Activities Negotitating around obstacles;Negotiating over obstacles;Marching forwards;Tandem walking;Backward walking;Braiding     Knee/Hip Exercises: Aerobic   Nustep L 5 6  min arms/legs for recipricol mvmt     Knee/Hip Exercises: Seated   Sit to Sand 5 reps;without UE support  3 sets wt ball                  PT Short Term Goals - 12/08/16 1448      PT SHORT TERM GOAL #1   Title independent with her home HEP   Baseline balacne at counter marching fwd/back,side stepping and tandem   Status Achieved           PT Long Term Goals - 04/15/17 1442      PT LONG TERM GOAL #2   Title increase Berg balance score to 46/56   Status Partially Met     PT LONG TERM GOAL #3   Title increase LE strength to 4/5   Status Achieved     PT LONG TERM GOAL #4   Status Partially Met               Plan - 04/15/17 1442    Clinical  Impression Statement goals met except BERG not met and still at fall risk and gait varies day to day. Reviewed all HEPS and options for continue strength/conditioning   PT Next Visit Plan D/C to independant ex for strength/endurance/gait and balance      Patient will benefit from skilled therapeutic intervention in order to improve the following deficits and impairments:  Abnormal gait, Decreased activity tolerance, Decreased balance, Decreased mobility, Decreased strength, Dizziness, Decreased range of motion, Difficulty walking  Visit Diagnosis: Muscle weakness (generalized)  Left hemiparesis (HCC)  Difficulty in walking, not elsewhere classified     Problem List Patient Active Problem List   Diagnosis Date Noted  . Anxiety 12/29/2016  . Vascular parkinsonism ([place]) 06/29/2016  . Gait disorder 04/07/2016  . Intracranial carotid stenosis 02/27/2016  . Essential hypertension   . Cerebrovascular accident (CVA) due to stenosis of right carotid artery ([place])    . Left-sided weakness 12/28/2015  . Accelerated hypertension 12/28/2015  . Headache 12/28/2015  . Adjustment reaction with anxiety 12/20/2015  . Stroke due to embolism of right middle cerebral artery ([place]) 12/17/2015  . Left hemiparesis ([place])   . Cerebrovascular accident (CVA) due to occlusion of cerebral artery ([place])   . History of CVA with residual deficit   . Prediabetes   . Right Basal ganglia infarction ([place]) 12/14/2015  . Acute CVA (cerebrovascular accident) ([place]) 12/14/2015  . SVT (supraventricular tachycardia) ([place]) 12/14/2015  . Stroke ([place]) 12/13/2015  . Stroke-like symptom 12/13/2015  . History of stroke   . Heart palpitations 09/06/2012  . Hypertension   . Hyperlipidemia   . Hypothyroidism   . Vitamin D deficiency   . Palpitations   . History of dizziness   . LVH (left ventricular hypertrophy)   . Aortic stenosis   . Mitral regurgitation   . SOB (shortness of breath)   . Difficulty walking    PHYSICAL THERAPY DISCHARGE SUMMARY   Plan: Patient agrees to discharge.  Patient goals were partially met. Patient is being discharged due to financial reasons.  ?????      Jamori Biggar,ANGIE PTA 04/15/2017, 2:45 PM  [place], [place], [address] Phone: [phone]   Fax:  [phone]  Name: Stacey Ramirez MRN: [id number] Date of Birth: 1941-02-01

## 2017-04-19 DIAGNOSIS — M25571 Pain in right ankle and joints of right foot: Secondary | ICD-10-CM | POA: Diagnosis not present

## 2017-04-19 DIAGNOSIS — I10 Essential (primary) hypertension: Secondary | ICD-10-CM | POA: Diagnosis not present

## 2017-04-19 DIAGNOSIS — R0609 Other forms of dyspnea: Secondary | ICD-10-CM | POA: Diagnosis not present

## 2017-04-19 DIAGNOSIS — I340 Nonrheumatic mitral (valve) insufficiency: Secondary | ICD-10-CM | POA: Diagnosis not present

## 2017-04-20 ENCOUNTER — Ambulatory Visit (INDEPENDENT_AMBULATORY_CARE_PROVIDER_SITE_OTHER): Payer: PPO | Admitting: Physician Assistant

## 2017-04-20 VITALS — BP 130/60 | HR 70 | Ht 62.0 in | Wt 119.8 lb

## 2017-04-20 DIAGNOSIS — I10 Essential (primary) hypertension: Secondary | ICD-10-CM

## 2017-04-20 DIAGNOSIS — R0602 Shortness of breath: Secondary | ICD-10-CM

## 2017-04-20 DIAGNOSIS — Z8673 Personal history of transient ischemic attack (TIA), and cerebral infarction without residual deficits: Secondary | ICD-10-CM | POA: Diagnosis not present

## 2017-04-20 DIAGNOSIS — I493 Ventricular premature depolarization: Secondary | ICD-10-CM | POA: Diagnosis not present

## 2017-04-20 NOTE — Progress Notes (Signed)
Cardiology Office Note:    Date:  04/20/2017   ID:  Stacey Ramirez, DOB 12-02-1940, MRN [id number]  PCP:  Deland Pretty, MD  Cardiologist:  Dr. Liam Rogers    Referring MD: Deland Pretty, MD   Chief Complaint  Patient presents with  . Shortness of Breath    History of Present Illness:    Stacey Ramirez is a 76 y.o. female with a hx of Palpitations, HTN, HL, prior CVA  The patient was admitted in 5/17 with cryptogenic stroke. ILR was implanted. Thus far, no arrhythmias have been identified. She was last seen by Dr. Acie Fredrickson 5/18. She was seen by primary care yesterday for dyspnea. Labs indicate hemoglobin was normal at 14.4. A CMET, BNP and ECG were obtained but these are pending.  [name] returns for evaluation of shortness of breath. She is here today with her husband. She notes shortness of breath for the last several weeks. She does not feel it is getting any worse. She continues to have left-sided weakness from her stroke. She has finished working with physical therapy. She denies chest discomfort, orthopnea, PND or syncope. She has palpitations at times. This is a chronic symptom. She notes mild pedal edema. She denies fevers, cough, melena, hematochezia.  Prior CV studies:   The following studies were reviewed today:  TEE 12/17/15 Mild focal basal septal hypertrophy, EF 60-65, normal wall motion, trivial AI, mild MR, no LAA clot  Echo 12/13/15 Mild LVH, EF 60-65, normal wall motion, grade 1 diastolic dysfunction, normal RVSF  Carotid US 12/13/15 Bilateral ICA 1-39  Echo 9/14 Mild LVH, EF 88-41, grade 1 diastolic dysfunction   Past Medical History:  Diagnosis Date  . Accelerated hypertension 12/28/2015  . Acute CVA (cerebrovascular accident) ([place]) 12/14/2015  . Adjustment reaction with anxiety 12/20/2015  . Anxiety 12/29/2016  . Aortic stenosis   . Cerebrovascular accident (stroke) ([place])    a. 12/2015 - cryptogenic.  S/P MDT Linq.  . Difficulty walking   . Essential  hypertension   . Gait disorder 04/07/2016  . Headache 12/28/2015  . Heart palpitations 09/06/2012  . History of CVA with residual deficit   . History of dizziness   . History of stroke   . Hypercholesterolemia   . Hyperlipidemia   . Hypertension   . Hypothyroidism   . Intracranial carotid stenosis 02/27/2016  . Left hemiparesis ([place])   . Left-sided weakness 12/28/2015  . LVH (left ventricular hypertrophy)   . Mitral regurgitation    a. 12/2015 Echo: EF 60-65%, mild focal basal hypertrophy. No rwma, triv AI, mild MR, mildly dil LA w/o thrombus. No RA thrombus. No PFO.  Marland Kitchen Palpitations   . Prediabetes   . Right Basal ganglia infarction ([place]) 12/14/2015  . SOB (shortness of breath)   . Stroke ([place]) 12/13/2015  . Stroke due to embolism of right middle cerebral artery ([place]) 12/17/2015  . Stroke-like symptom 12/13/2015  . SVT (supraventricular tachycardia) ([place]) 12/14/2015  . Vascular parkinsonism ([place]) 06/29/2016  . Vitamin D deficiency     Past Surgical History:  Procedure Laterality Date  . CARDIOVASCULAR STRESS TEST  2002   NORMAL  . EP IMPLANTABLE DEVICE N/A 12/17/2015   Procedure: Loop Recorder Insertion;  Surgeon: [name], MD;  Location: [facility];  Service: Cardiovascular;  Laterality: N/A;  . LOOP RECORDER IMPLANT    . TEE WITHOUT CARDIOVERSION N/A 12/17/2015   Procedure: TRANSESOPHAGEAL ECHOCARDIOGRAM (TEE);  Surgeon: Lelon Perla, MD;  Location: [facility] ENDOSCOPY;  Service: Cardiovascular;  Laterality: N/A;  Pt also needs a LOOP  . TRANSTHORACIC ECHOCARDIOGRAM  2008   SHOWED LEFT VENTRICULAR HYPERTROPHY AND MILD AORTIC STENOSIS    Current Medications: Current Meds  Medication Sig  . ALPRAZolam (XANAX) 0.25 MG tablet Take 0.25 mg by mouth at bedtime as needed for anxiety or sleep.  Marland Kitchen amLODipine (NORVASC) 5 MG tablet Take 1 tablet (5 mg total) by mouth daily.  . Ca Phosphate-Cholecalciferol [phone] MG-UNIT TABS Take 2,000 Units by mouth daily.  .  carboxymethylcellulose (REFRESH PLUS) 0.5 % SOLN Place 1 drop into both eyes 3 (three) times daily as needed (DRY EYE).   Marland Kitchen clopidogrel (PLAVIX) 75 MG tablet Take 1 tablet (75 mg total) by mouth daily.  Marland Kitchen docusate sodium (COLACE) 100 MG capsule Take 100 mg by mouth daily as needed for mild constipation or moderate constipation.   Marland Kitchen estradiol (ESTRACE) 0.1 MG/GM vaginal cream Place 2 g vaginally as needed (dryness).   . irbesartan (AVAPRO) 300 MG tablet Take 1 tablet (300 mg total) by mouth daily.  Marland Kitchen levothyroxine (SYNTHROID, LEVOTHROID) 25 MCG tablet Take 25 mcg by mouth daily before breakfast.   . loratadine (CLARITIN) 10 MG tablet Take 10 mg by mouth daily as needed for allergies.   Marland Kitchen LOTEMAX 0.5 % ophthalmic suspension Place 1 drop into both eyes 4 (four) times daily as needed. For dry itchy eyes  . Multiple Vitamin (MULTIVITAMIN) tablet Take 1 tablet by mouth daily.  . polyethylene glycol (MIRALAX / GLYCOLAX) packet Take 17 g by mouth daily as needed for mild constipation.  . sertraline (ZOLOFT) 50 MG tablet Take 50 mg by mouth daily.      Allergies:   Sulfa drugs cross reactors; Zocor [simvastatin]; Crestor [rosuvastatin calcium]; Statins; and Micardis [telmisartan]   Social History   Social History  . Marital status: Married    Spouse name: N/A  . Number of children: 2  . Years of education: college   Occupational History  . retired    Social History Main Topics  . Smoking status: Never Smoker  . Smokeless tobacco: Never Used  . Alcohol use 0.6 oz/week    1 Glasses of wine per week     Comment: socially  . Drug use: No  . Sexual activity: Not Asked   Other Topics Concern  . None   Social History Narrative  . None     Family Hx: The patient's family history includes Alzheimer's disease in her mother; Heart attack in her mother; Stroke in her paternal uncle.  ROS:   Please see the history of present illness.    ROS All other systems reviewed and are  negative.   EKGs/Labs/Other Test Reviewed:    EKG:  EKG is  ordered today.  The ekg ordered today demonstrates NSR, HR 86, PVCs and bigeminal pattern  Recent Labs: No results found for requested labs within last 8760 hours.   Recent Lipid Panel Lab Results  Component Value Date/Time   CHOL 315 (H) 12/13/2015 03:58 PM   CHOL 331 (H) 04/14/2013 09:21 AM   TRIG 59 12/13/2015 03:58 PM   HDL 65 12/13/2015 03:58 PM   HDL 65 04/14/2013 09:21 AM   CHOLHDL 4.8 12/13/2015 03:58 PM   LDLCALC 238 (H) 12/13/2015 03:58 PM   LDLCALC 244 (H) 04/14/2013 09:21 AM   LDLDIRECT 224.6 12/14/2011 09:19 AM    Physical Exam:    VS:  BP 130/60   Pulse 70   Ht 5\' 2"  (1.575 m)  Wt 119 lb 12.8 oz (54.3 kg)   SpO2 97%   BMI 21.91 kg/m     Wt Readings from Last 3 Encounters:  04/20/17 119 lb 12.8 oz (54.3 kg)  03/08/17 119 lb (54 kg)  12/29/16 118 lb 6.4 oz (53.7 kg)     Physical Exam  Constitutional: She is oriented to person, place, and time. She appears well-developed and well-nourished. No distress.  HENT:  Head: Normocephalic and atraumatic.  Eyes: No scleral icterus.  Neck: No JVD present.  Cardiovascular: Normal rate.  An irregularly irregular rhythm present.  No murmur heard. Pulmonary/Chest: Effort normal. She has no rales.  Abdominal: Soft. There is no hepatomegaly. There is no tenderness.  Musculoskeletal: She exhibits no edema.  Neurological: She is alert and oriented to person, place, and time.  Skin: Skin is warm and dry.  Psychiatric: She has a normal mood and affect.    ASSESSMENT:    1. Shortness of breath   2. PVC's (premature ventricular contractions)   3. Essential hypertension   4. History of stroke    PLAN:    In order of problems listed above:  1. Shortness of breath Etiology is not entirely clear. Overall, I suspect deconditioning from her stroke. However, she does have risk factors for CAD. She has frequent PVCs on her ECG today. Question if her symptoms  may be ischemic in nature. Echocardiogram last year demonstrated normal LV function. She did have labs at her PCPs office yesterday. We will try to obtain a copy of the CMET, BNP.  -  Arrange nuclear stress test  -  Arrange echocardiogram  -  Close follow-up in 2-3 weeks  2. PVC's (premature ventricular contractions)  She has several PVCs on ECG today. I am not certain how this is related to her current symptoms. Echocardiogram will be obtained. I will also arrange a 24 Holter to assess for PVC burden. Close follow-up in 2-3 weeks.  3. Essential hypertension Blood pressure is currently controlled. Continue current therapy.   4. History of stroke  She is status post ILR. Thus far, atrial fibrillation has not been identified.   Dispo:  Return in about 2 weeks (around 05/04/2017) for Close Follow Up, w/ Dr. Acie Fredrickson, or Richardson Dopp, PA-C.   Medication Adjustments/Labs and Tests Ordered: Current medicines are reviewed at length with the patient today.  Concerns regarding medicines are outlined above.  Tests Ordered: Orders Placed This Encounter  Procedures  . Holter monitor - 24 hour  . Myocardial Perfusion Imaging  . EKG 12-Lead  . ECHOCARDIOGRAM COMPLETE   Medication Changes: No orders of the defined types were placed in this encounter.   Signed, Richardson Dopp, PA-C  04/20/2017 5:34 PM    [facility] [place], [place], [place]  [address] Phone: [phone]; Fax: [phone]

## 2017-04-20 NOTE — Patient Instructions (Signed)
Medication Instructions:  Your physician recommends that you continue on your current medications as directed. Please refer to the Current Medication list given to you today.   Labwork: NONE ORDERED TODAY  Testing/Procedures: 1. Your physician has requested that you have an echocardiogram. Echocardiography is a painless test that uses sound waves to create images of your heart. It provides your doctor with information about the size and shape of your heart and how well your heart's chambers and valves are working. This procedure takes approximately one hour. There are no restrictions for this procedure.  2. Your physician has requested that you have a lexiscan myoview. For further information please visit [email or web address]. Please follow instruction sheet, as given.  3. Your physician has recommended that you wear a 24 HOUR holter monitor. Holter monitors are medical devices that record the heart's electrical activity. Doctors most often use these monitors to diagnose arrhythmias. Arrhythmias are problems with the speed or rhythm of the heartbeat. The monitor is a small, portable device. You can wear one while you do your normal daily activities. This is usually used to diagnose what is causing palpitations/syncope (passing out).    Follow-Up: SCOTT WEAVER, PAC 2-3 WEEKS SAME DAY DR. Acie Fredrickson IS IN THE OFFICE   Any Other Special Instructions Will Be Listed Below (If Applicable).     If you need a refill on your cardiac medications before your next appointment, please call your pharmacy.

## 2017-04-25 ENCOUNTER — Emergency Department (HOSPITAL_BASED_OUTPATIENT_CLINIC_OR_DEPARTMENT_OTHER): Payer: PPO

## 2017-04-25 ENCOUNTER — Emergency Department (HOSPITAL_BASED_OUTPATIENT_CLINIC_OR_DEPARTMENT_OTHER)
Admission: EM | Admit: 2017-04-25 | Discharge: 2017-04-25 | Disposition: A | Discharge status: Home / Self Care | Payer: PPO | Attending: Emergency Medicine | Admitting: Emergency Medicine

## 2017-04-25 ENCOUNTER — Encounter (HOSPITAL_BASED_OUTPATIENT_CLINIC_OR_DEPARTMENT_OTHER): Payer: Self-pay | Admitting: Emergency Medicine

## 2017-04-25 DIAGNOSIS — Z79899 Other long term (current) drug therapy: Secondary | ICD-10-CM | POA: Insufficient documentation

## 2017-04-25 DIAGNOSIS — R42 Dizziness and giddiness: Secondary | ICD-10-CM | POA: Insufficient documentation

## 2017-04-25 DIAGNOSIS — I10 Essential (primary) hypertension: Secondary | ICD-10-CM

## 2017-04-25 DIAGNOSIS — Z8673 Personal history of transient ischemic attack (TIA), and cerebral infarction without residual deficits: Secondary | ICD-10-CM | POA: Insufficient documentation

## 2017-04-25 DIAGNOSIS — E039 Hypothyroidism, unspecified: Secondary | ICD-10-CM | POA: Diagnosis not present

## 2017-04-25 DIAGNOSIS — R51 Headache: Secondary | ICD-10-CM | POA: Diagnosis not present

## 2017-04-25 LAB — COMPREHENSIVE METABOLIC PANEL
ALT: 18 U/L (ref 14–54)
AST: 23 U/L (ref 15–41)
Albumin: 4.1 g/dL (ref 3.5–5.0)
Alkaline Phosphatase: 63 U/L (ref 38–126)
Anion gap: 8 (ref 5–15)
BILIRUBIN TOTAL: 0.8 mg/dL (ref 0.3–1.2)
BUN: 12 mg/dL (ref 6–20)
CHLORIDE: 104 mmol/L (ref 101–111)
CO2: 28 mmol/L (ref 22–32)
CREATININE: 0.71 mg/dL (ref 0.44–1.00)
Calcium: 9.6 mg/dL (ref 8.9–10.3)
GFR calc Af Amer: >60 mL/min (ref >60)
Glucose, Bld: 101 mg/dL — ABNORMAL HIGH (ref 65–99)
Potassium: 3.6 mmol/L (ref 3.5–5.1)
Sodium: 140 mmol/L (ref 135–145)
TOTAL PROTEIN: 7.5 g/dL (ref 6.5–8.1)

## 2017-04-25 LAB — TROPONIN I

## 2017-04-25 LAB — CBC WITH DIFFERENTIAL/PLATELET
BASOS ABS: 0 10*3/uL (ref 0.0–0.1)
BASOS PCT: 0 %
Eosinophils Absolute: 0.1 10*3/uL (ref 0.0–0.7)
Eosinophils Relative: 2 %
HEMATOCRIT: 41.9 % (ref 36.0–46.0)
Hemoglobin: 14.3 g/dL (ref 12.0–15.0)
Lymphocytes Relative: 27 %
Lymphs Abs: 1.7 10*3/uL (ref 0.7–4.0)
MCH: 31.9 pg (ref 26.0–34.0)
MCHC: 34.1 g/dL (ref 30.0–36.0)
MCV: 93.5 fL (ref 78.0–100.0)
MONO ABS: 0.6 10*3/uL (ref 0.1–1.0)
Monocytes Relative: 9 %
NEUTROS ABS: 3.8 10*3/uL (ref 1.7–7.7)
NEUTROS PCT: 62 %
PLATELETS: 219 10*3/uL (ref 150–400)
RBC: 4.48 MIL/uL (ref 3.87–5.11)
RDW: 12.8 % (ref 11.5–15.5)
WBC: 6.2 10*3/uL (ref 4.0–10.5)

## 2017-04-25 MED ORDER — AMLODIPINE BESYLATE 5 MG PO TABS
5.0000 mg | ORAL_TABLET | Freq: Once | ORAL | Status: AC
  Administered 2017-04-25: 5 mg via ORAL
  Filled 2017-04-25: qty 1

## 2017-04-25 NOTE — ED Notes (Signed)
ED Provider at bedside. 

## 2017-04-25 NOTE — ED Provider Notes (Signed)
[facility]PT [facility] Provider Note   CSN: [id number] Arrival date & time: 04/25/17  1346     History   Chief Complaint Chief Complaint  Patient presents with  . Hypertension    HPI Stacey Ramirez is a 76 y.o. female hx Of hypertension, previous stroke with left-sided weakness, here presenting with dizziness, hypertension. Patient states that she has been feeling dizzy today and noticed her blood pressure was 180/100. Yesterday her blood pressure was 115/50 and she did not take her blood pressure medicine yesterday but didn't take her Avapro and Norvasc today. She also feels a lightheaded dizzy and diffusely weak but denies any worsening weakness on the left side or trouble speaking or numbness. She has chronic shortness of breath but denies any worsening shortness of breath or chest pain or leg swelling. She has a primary care appointment scheduled tomorrow.   The history is provided by the patient.    Past Medical History:  Diagnosis Date  . Accelerated hypertension 12/28/2015  . Acute CVA (cerebrovascular accident) ([place]) 12/14/2015  . Adjustment reaction with anxiety 12/20/2015  . Anxiety 12/29/2016  . Aortic stenosis   . Cerebrovascular accident (stroke) ([place])    a. 12/2015 - cryptogenic.  S/P MDT Linq.  . Difficulty walking   . Essential hypertension   . Gait disorder 04/07/2016  . Headache 12/28/2015  . Heart palpitations 09/06/2012  . History of CVA with residual deficit   . History of dizziness   . History of stroke   . Hypercholesterolemia   . Hyperlipidemia   . Hypertension   . Hypothyroidism   . Intracranial carotid stenosis 02/27/2016  . Left hemiparesis ([place])   . Left-sided weakness 12/28/2015  . LVH (left ventricular hypertrophy)   . Mitral regurgitation    a. 12/2015 Echo: EF 60-65%, mild focal basal hypertrophy. No rwma, triv AI, mild MR, mildly dil LA w/o thrombus. No RA thrombus. No PFO.  Marland Kitchen Palpitations   . Prediabetes   . Right Basal ganglia infarction ([place])  12/14/2015  . SOB (shortness of breath)   . Stroke ([place]) 12/13/2015  . Stroke due to embolism of right middle cerebral artery ([place]) 12/17/2015  . Stroke-like symptom 12/13/2015  . SVT (supraventricular tachycardia) ([place]) 12/14/2015  . 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 Vitamin D deficiency     Patient Active Problem List   Diagnosis Date Noted  . Anxiety 12/29/2016  . 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 Gait disorder 04/07/2016  . Intracranial carotid stenosis 02/27/2016  . Essential hypertension   . Cerebrovascular accident (CVA) due to stenosis of right carotid artery ([place])   . Left-sided weakness 12/28/2015  . Accelerated hypertension 12/28/2015  . Headache 12/28/2015  . Adjustment reaction with anxiety 12/20/2015  . Stroke due to embolism of right middle cerebral artery ([place]) 12/17/2015  . Left hemiparesis ([place])   . Cerebrovascular accident (CVA) due to occlusion of cerebral artery ([place])   . History of CVA with residual deficit   . Prediabetes   . Right Basal ganglia infarction ([place]) 12/14/2015  . Acute CVA (cerebrovascular accident) ([place]) 12/14/2015  . SVT (supraventricular tachycardia) ([place]) 12/14/2015  . Stroke ([place]) 12/13/2015  . Stroke-like symptom 12/13/2015  . History of stroke   . Heart palpitations 09/06/2012  . Hypertension   . Hyperlipidemia   . Hypothyroidism   . Vitamin D deficiency   . Palpitations   . History of dizziness   . LVH (left ventricular hypertrophy)   . Aortic stenosis   .  Mitral regurgitation   . SOB (shortness of breath)   . Difficulty walking     Past Surgical History:  Procedure Laterality Date  . CARDIOVASCULAR STRESS TEST  2002   NORMAL  . EP IMPLANTABLE DEVICE N/A 12/17/2015   Procedure: Loop Recorder Insertion;  Surgeon: [name], MD;  Location: [facility];  Service: Cardiovascular;  Laterality: N/A;  . LOOP RECORDER IMPLANT    . TEE WITHOUT CARDIOVERSION N/A 12/17/2015   Procedure: TRANSESOPHAGEAL  ECHOCARDIOGRAM (TEE);  Surgeon: Lelon Perla, MD;  Location: [facility] ENDOSCOPY;  Service: Cardiovascular;  Laterality: N/A;  Pt also needs a LOOP  . TRANSTHORACIC ECHOCARDIOGRAM  2008   SHOWED LEFT VENTRICULAR HYPERTROPHY AND MILD AORTIC STENOSIS    OB History    No data available       Home Medications    Prior to Admission medications   Medication Sig Start Date End Date Taking? Authorizing Provider  ALPRAZolam Duanne Moron) 0.25 MG tablet Take 0.25 mg by mouth at bedtime as needed for anxiety or sleep.    [provider]  amLODipine (NORVASC) 5 MG tablet Take 1 tablet (5 mg total) by mouth daily. 05/22/16 04/20/17  Nahser, Wonda Cheng, MD  Ca Phosphate-Cholecalciferol [phone] MG-UNIT TABS Take 2,000 Units by mouth daily.    [provider]  carboxymethylcellulose (REFRESH PLUS) 0.5 % SOLN Place 1 drop into both eyes 3 (three) times daily as needed (DRY EYE).     [provider]  clopidogrel (PLAVIX) 75 MG tablet Take 1 tablet (75 mg total) by mouth daily. 12/24/15   Love, Ivan Anchors, PA-C  docusate sodium (COLACE) 100 MG capsule Take 100 mg by mouth daily as needed for mild constipation or moderate constipation.     [provider]  estradiol (ESTRACE) 0.1 MG/GM vaginal cream Place 2 g vaginally as needed (dryness).     [provider]  irbesartan (AVAPRO) 300 MG tablet Take 1 tablet (300 mg total) by mouth daily. 12/29/15   Velvet Bathe, MD  levothyroxine (SYNTHROID, LEVOTHROID) 25 MCG tablet Take 25 mcg by mouth daily before breakfast.  09/21/14   [provider]  loratadine (CLARITIN) 10 MG tablet Take 10 mg by mouth daily as needed for allergies.     [provider]  LOTEMAX 0.5 % ophthalmic suspension Place 1 drop into both eyes 4 (four) times daily as needed. For dry itchy eyes 02/17/13   [provider]  Multiple Vitamin (MULTIVITAMIN) tablet Take 1 tablet by mouth daily.    [provider]  polyethylene glycol  (MIRALAX / GLYCOLAX) packet Take 17 g by mouth daily as needed for mild constipation.    [provider]  sertraline (ZOLOFT) 50 MG tablet Take 50 mg by mouth daily.     [provider]    Family History Family History  Problem Relation Age of Onset  . Heart attack Mother   . Alzheimer's disease Mother   . Stroke Paternal Uncle     Social History Social History  Substance Use Topics  . Smoking status: Never Smoker  . Smokeless tobacco: Never Used  . Alcohol use 0.6 oz/week    1 Glasses of wine per week     Comment: socially     Allergies   Sulfa drugs cross reactors; Zocor [simvastatin]; Crestor [rosuvastatin calcium]; Statins; and Micardis [telmisartan]   Review of Systems Review of Systems  Neurological: Positive for dizziness.  All other systems reviewed and are negative.    Physical  Exam Updated Vital Signs BP (!) 167/105 (BP Location: Left Arm)   Pulse (!) 105   Temp 99.1 F (37.3 C) (Oral)   Resp 20   SpO2 100%   Physical Exam  Constitutional: She is oriented to person, place, and time.  Chronically ill, NAD   HENT:  Head: Normocephalic.  Mouth/Throat: Oropharynx is clear and moist.  Eyes: Pupils are equal, round, and reactive to light. Conjunctivae and EOM are normal.  Neck: Normal range of motion. Neck supple.  Cardiovascular: Normal rate, regular rhythm and normal heart sounds.   Pulmonary/Chest: Effort normal and breath sounds normal. No respiratory distress. She has no wheezes. She has no rales.  Abdominal: Soft. Bowel sounds are normal. She exhibits no distension. There is no tenderness. There is no guarding.  Musculoskeletal: Normal range of motion.  Neurological: She is alert and oriented to person, place, and time. She displays normal reflexes. No cranial nerve deficit. Coordination normal.  Strength 4/5 L side (chronic), nl sensation throughout.   Skin: Skin is warm.  Psychiatric: She has a normal mood and affect.  Nursing  note and vitals reviewed.    ED Treatments / Results  Labs (all labs ordered are listed, but only abnormal results are displayed) Labs Reviewed  CBC WITH DIFFERENTIAL/PLATELET  COMPREHENSIVE METABOLIC PANEL  TROPONIN I    EKG  EKG Interpretation  Date/Time:  Sunday April 25 2017 15:22:09 EDT Ventricular Rate:  87 PR Interval:    QRS Duration: 138 QT Interval:  439 QTC Calculation: 529 R Axis:   -161 Text Interpretation:  Age not entered, assumed to be  76 years old for purpose of ECG interpretation Sinus rhythm Nonspecific intraventricular conduction delay Consider anterior infarct IVCD new since previous  Confirmed by Wandra Arthurs [phone]) on 04/25/2017 3:28:09 PM       Radiology No results found.  Procedures Procedures (including critical care time)  Medications Ordered in ED Medications  amLODipine (NORVASC) tablet 5 mg (5 mg Oral Given 04/25/17 1524)     Initial Impression / Assessment and Plan / ED Course  I have reviewed the triage vital signs and the nursing notes.  Pertinent labs & imaging results that were available during my care of the patient were reviewed by me and considered in my medical decision making (see chart for details).    Stacey Ramirez is a 76 y.o. female here with hypertension, dizziness. Likely symptomatic hypertension. Will check labs, EKG, CT head. Will give norvasc and reassess.   4:42 PM BP went up to 180s in the ED, back down to 168/78 on discharge after norvasc. Has PCP follow up tomorrow. CT head unremarkable. Labs at baseline. Nonspecific EKG changes but has no chest pain and trop neg x 1. Will dc home.    Final Clinical Impressions(s) / ED Diagnoses   Final diagnoses:  None    New Prescriptions New Prescriptions   No medications on file     Drenda Freeze, MD 04/25/17 [phone]

## 2017-04-25 NOTE — Discharge Instructions (Signed)
Take your medicines as prescribed.   See your doctor tomorrow to recheck your blood pressure.   Return to ER if you have worse dizziness, chest pain, trouble breathing, abdominal pain, vomiting, passing out.

## 2017-04-25 NOTE — ED Notes (Signed)
Patient transported to CT 

## 2017-04-25 NOTE — ED Triage Notes (Signed)
Pt's husband reports pt. BP was low yesterday (110/60), so he held BP med; today it was high x 3 checks at home.

## 2017-04-26 DIAGNOSIS — I10 Essential (primary) hypertension: Secondary | ICD-10-CM | POA: Diagnosis not present

## 2017-04-26 DIAGNOSIS — Z8673 Personal history of transient ischemic attack (TIA), and cerebral infarction without residual deficits: Secondary | ICD-10-CM | POA: Diagnosis not present

## 2017-04-26 DIAGNOSIS — E7800 Pure hypercholesterolemia, unspecified: Secondary | ICD-10-CM | POA: Diagnosis not present

## 2017-04-27 ENCOUNTER — Other Ambulatory Visit: Payer: Self-pay | Admitting: Internal Medicine

## 2017-04-30 ENCOUNTER — Encounter: Payer: Self-pay | Admitting: Physician Assistant

## 2017-04-30 DIAGNOSIS — E039 Hypothyroidism, unspecified: Secondary | ICD-10-CM | POA: Diagnosis not present

## 2017-04-30 DIAGNOSIS — E040 Nontoxic diffuse goiter: Secondary | ICD-10-CM | POA: Diagnosis not present

## 2017-05-03 ENCOUNTER — Telehealth (HOSPITAL_COMMUNITY): Payer: Self-pay | Admitting: *Deleted

## 2017-05-03 NOTE — Telephone Encounter (Signed)
Left message on voicemail per DPR in reference to upcoming appointment scheduled on 05/05/17 with detailed instructions given per Myocardial Perfusion Study Information Sheet for the test. LM to arrive 15 minutes early, and that it is imperative to arrive on time for appointment to keep from having the test rescheduled. If you need to cancel or reschedule your appointment, please call the office within 24 hours of your appointment. Failure to do so may result in a cancellation of your appointment, and a $50 no show fee. Phone number given for call back for any questions. Kirstie Peri

## 2017-05-04 ENCOUNTER — Ambulatory Visit (INDEPENDENT_AMBULATORY_CARE_PROVIDER_SITE_OTHER): Payer: PPO | Admitting: Neurology

## 2017-05-04 ENCOUNTER — Encounter: Payer: Self-pay | Admitting: Neurology

## 2017-05-04 ENCOUNTER — Ambulatory Visit (INDEPENDENT_AMBULATORY_CARE_PROVIDER_SITE_OTHER): Payer: PPO

## 2017-05-04 VITALS — BP 151/58 | HR 72 | Ht 62.0 in | Wt 121.6 lb

## 2017-05-04 DIAGNOSIS — R262 Difficulty in walking, not elsewhere classified: Secondary | ICD-10-CM

## 2017-05-04 DIAGNOSIS — I6523 Occlusion and stenosis of bilateral carotid arteries: Secondary | ICD-10-CM | POA: Diagnosis not present

## 2017-05-04 DIAGNOSIS — Z8673 Personal history of transient ischemic attack (TIA), and cerebral infarction without residual deficits: Secondary | ICD-10-CM

## 2017-05-04 DIAGNOSIS — I10 Essential (primary) hypertension: Secondary | ICD-10-CM | POA: Diagnosis not present

## 2017-05-04 DIAGNOSIS — I493 Ventricular premature depolarization: Secondary | ICD-10-CM | POA: Diagnosis not present

## 2017-05-04 DIAGNOSIS — F419 Anxiety disorder, unspecified: Secondary | ICD-10-CM

## 2017-05-04 NOTE — Progress Notes (Signed)
STROKE NEUROLOGY FOLLOW UP NOTE  NAME: Stacey Ramirez DOB: 11-23-1940  REASON FOR VISIT: stroke follow up HISTORY FROM: pt and husband and chart  Today we had the pleasure of seeing Stacey Ramirez in follow-up at our Neurology Clinic. Pt was accompanied by husband.   History Summary Ms. Stacey Ramirez is a 76 y.o. female with history of hypertension, hyperlipidemia, hypothyroidism, aortic stenosis, mitral regurgitation, and previous stroke admitted on 12/13/15 for left hemiparesis. MRI showed acute moderate large infarct in the right BG as well as a punctate infarct periventricular WM adjacent to right occipital horn, more embolic pattern. MRA showed moderate stenosis of the supraclinoid right ICA, right P1 and left P2. CUS, TTE, DVT screening, TEE all negative. A1C 5.7 and LDL 238. Loop recorder placed. She was discharged to CIR with DAPT and crestor.   Follow up 02/27/16 - the patient has been doing well. She stayed in CIR for one week and discharged home with home PT/OT and now on outpt PT/OT. She still not able to tolerate statin and was put now on praluent. On 12/27/15 she was admitted for HA and muscle weakness. She was treated for high BP and crestor discontinued on discharge. She went back to ER on 01/01/16 due to generalized weakness, SOB and near fall. CT head showed no acute changes. Felt related to stress and anxiety. Since then, pt continue to feel easy fatigue, generalized weakness and depressed mood. She was put on zoloft. She continues to work with PT/OT and currently gradually recovering. BP today 140/78. Loop recorder so far no afib.  Follow up 06/29/16 - pt has been doing well. She still has outpt PT/OT 2/week. She stated that she felt her strength and balance did not improve over time. She did not do too much home exercise though. Her BP at home 130-150/75-85 in the morning. However, after BP meds, her BP drops, sometimes significantly. Pt felt that could be one of the reasons that she  felt tired during exercise. Her BP today 164/85 in clinic. She is on plavix. She was also on praluent but LDL not in good control, and now she is on repatha. Loop recorder no afib so far.  Follow up 09/23/16 - pt stated that she was on praluent for 5-6 months and repatha for 2 months, but her LDL still high and not coming down. Also due to mild weakness side effect, those medications were discontinued. She was put on Livalo, but still not able to tolerate due to bilateral leg weakness. For the last 2 months, she felt continued leg weakness, not able to walk well, also felt lightheadedness. However, her PT/OT was considered well and she was discharged from rehab services. In clinic, she had likely cautious gait and psychogenic gait, most consistent with anxiety and depression. BP 140/78.  12/29/16 follow up - pt continues to feel no improvement of her walking.she had PT/OT but no significant improvement. PT concerns about PD and pt was very concerned and checked with internet, found herself to have not sleeping good, voice lower than before, shuffling gait, not able to climb stairs, easily fatigued, and not enough energy. She had one remote relative had PD as she can remember. She feels dizzy when walking, especially in tight space such as grocery stores. This symptoms started after the stroke one year ago and a fall 7-8 months ago. She is on plavix without side effect. Still on zoloft 50mg  and did not see any effect. EMG/NCS showed mild sensory  motor neuropathy. Her BP 155/77 today but at home always 120-130s.  However, during the encounter, pt was able to talk normally without low voice, seems anxious and crying, husband admitted that she was anxious at home and terrified when the PT asked if she has relatives having PD.   Interval History During the interval time, pt has been doing the same. She saw Dr. Rexene Alberts in 03/2017 and no parkinson features considered. She also had a several visit with psychologist and  did not find any help. She also finished with PT for several sessions and found no significant improvement. Loop recorder no A. fib. Currently, she is able to walk in house with caution, however not to walk outside, she has to hold her husband and walking outside. On 04/25/17, she had ED visit due to hypertensive urgency, feeling woozy but BP cannot be controlled at home with tremendous anxiety. Had a CT head which is negative at that time.   REVIEW OF SYSTEMS: Full 14 system review of systems performed and notable only for those listed below and in HPI above, all others are negative:  Constitutional:   Cardiovascular:  Ear/Nose/Throat:   Skin:  Eyes:  Eye discharge Respiratory:  SOB Gastroitestinal:   Genitourinary: Bladder incontinence Hematology/Lymphatic: Bruise easily Endocrine:  Musculoskeletal:  Walking difficulty, joint pain Allergy/Immunology:   Neurological:  dizziness, weakness Psychiatric: Nervous, anxious Sleep:   The following represents the patient's updated allergies and side effects list: Allergies  Allergen Reactions  . Sulfa Drugs Cross Reactors Itching  . Zocor [Simvastatin] Other (See Comments)    Feels like she is going to pass out, weakness  . Crestor [Rosuvastatin Calcium]     Muscle weakness  . Statins Other (See Comments)    Unable to walk or stand.  . Micardis [Telmisartan] Other (See Comments)    Feels like she is going to pass out    The neurologically relevant items on the patient's problem list were reviewed on today's visit.  Neurologic Examination  A problem focused neurological exam (12 or more points of the single system neurologic examination, vital signs counts as 1 point, cranial nerves count for 8 points) was performed.  Blood pressure (!) 151/58, pulse 72, height 5\' 2"  (1.575 m), weight 121 lb 9.6 oz (55.2 kg).  General - Well nourished, well developed, in no apparent distress.  Ophthalmologic - Sharp disc margins OU.    Cardiovascular - Regular rate and rhythm with no murmur.  Mental Status -  Level of arousal and orientation to time, place, and person were intact. Language including expression, naming, repetition, comprehension was assessed and found intact. Fund of Knowledge was assessed and was intact.  Cranial Nerves II - XII - II - Visual field intact OU. III, IV, VI - Extraocular movements intact. V - Facial sensation intact bilaterally. VII - Facial movement intact bilaterally. VIII - Hearing & vestibular intact bilaterally. X - Palate elevates symmetrically. XI - Chin turning & shoulder shrug intact bilaterally. XII - Tongue protrusion intact.  Motor Strength - The patient's strength was normal in all extremities except mildly decreased dexterity of left hand and pronator drift was absent.  Bulk was normal and fasciculations were absent.   Motor Tone - Muscle tone was assessed at the neck and appendages and was normal, no rigidity.  Reflexes - The patient's reflexes were 1+ in all extremities and she had no pathological reflexes.  Sensory - Light touch, temperature/pinprick were assessed and were normal.    Coordination - The  patient had normal movements in the hands and feet with no ataxia or dysmetria.  Tremor was absent. No cog-wheeling.   Gait and Station - stand up from chair without difficulty, however, very cautious gait, trying to hold onto things, frequent stop, no gait initiation difficulty, no shuffling gait, slow turns. Walking normally if holding husband arm. No stooped posturing. Broad-based gait present.   Data reviewed: I personally reviewed the images and agree with the radiology interpretations.  Ct Head Wo Contrast 12/13/2015   No acute abnormality. Atrophy and chronic microvascular ischemic change.   Mri and Mra Head/brain Wo Cm 12/13/2015   1. Confluent acute infarct in the right basal ganglia with no associated hemorrhage or mass effect. Superimposed punctate  posterior right MCA periventricular white matter infarct.  2. Negative for emergent large vessel occlusion. Anterior circulation atherosclerosis appears stable since 2014, including at least moderate stenosis of the supraclinoid right ICA (series 705, image 6).  3. Mild progression of posterior circulation atherosclerosis since 2014 including new mild stenosis of the mid basilar artery and moderate stenosis of the right PCA P1 segment.  4. Underlying chronic small vessel ischemia, moderate for age.   LE venous doppler - Lower extremity venous duplex has been completed. Preliminary findings: No evidence of DVT or baker's cyst.  CUS - Bilateral: 1-39% ICA stenosis. Vertebral artery flow is antegrade.  TTE - Left ventricle: The cavity size was normal. Wall thickness was  increased in a pattern of mild LVH. Systolic function was normal.  The estimated ejection fraction was in the range of 60% to 65%.  Wall motion was normal; there were no regional wall motion  abnormalities. Doppler parameters are consistent with abnormal  left ventricular relaxation (grade 1 diastolic dysfunction). - Aortic valve: There was no stenosis. - Mitral valve: There was no significant regurgitation. - Right ventricle: The cavity size was normal. Systolic function  was normal. - Pulmonary arteries: No complete TR doppler jet so unable to  estimate PA systolic pressure. - Inferior vena cava: The vessel was normal in size. The  respirophasic diameter changes were in the normal range (>= 50%),  consistent with normal central venous pressure. Impressions: - Normal LV size with mild LV hypertrophy. EF 60-65%. Normal RV  size and systolic function. No significant valvular  abnormalities.  TEE - normal LV function; negative saline microcavitation study  CT head 12/28/15 1. Expected evolution of recent right basal ganglia infarct. No hemorrhagic transformation, increased size or mass effect. 2. Background  atrophy and chronic small vessel ischemia, unchanged. No new abnormality is seen.  CT head 01/01/16 1. The known right basal ganglia and right periventricular white matter infarct shown on 12/13/2015 is surprisingly indistinct on today's exam, with only very vague heterogeneity in this vicinity. No hemorrhagic transformation or acute complicating feature. 2. Old lacunar infarct of the left anterior thalamus. Chronic microvascular white matter disease. No new significant abnormality observed.  CT head 04/25/17 No acute intracranial abnormalities, old infarcts.  Component     Latest Ref Rng & Units 12/13/2015  Cholesterol     0 - 200 mg/dL 315 (H)  Triglycerides     <150 mg/dL 59  HDL Cholesterol     >40 mg/dL 65  Total CHOL/HDL Ratio     RATIO 4.8  VLDL     0 - 40 mg/dL 12  LDL (calc)     0 - 99 mg/dL 238 (H)  Hemoglobin A1C     4.8 - 5.6 % 5.7 (H)  Mean Plasma Glucose     mg/dL 117  TSH     0.350 - 4.500 uIU/mL 0.911    Assessment: As you may recall, she is a 76 y.o. Caucasian female with PMH of HTN, HLD, hypothyroidism, aortic stenosis, mitral regurgitation, and previous stroke admitted on 12/13/15 for acute moderate large infarct in the right BG as well as a punctate infarct periventricular WM adjacent to right occipital horn. MRA showed severe stenosis of the supraclinoid right ICA, as well as right P1 and left P2. CUS, TTE, DVT screening, TEE all negative. A1C 5.7 and LDL 238. Loop recorder placed. She was discharged to CIR with DAPT and crestor. Pt had previous stroke in 04/2013 with small left BG infarct as well as remote left cerebellar infarct. MRA at that time also showed right ICA supraclinoid segment severe stenosis. Therefore, the current stroke is more likely due to large vessel disease. So far loop recorder no afib. Pt had dramatic recovery and no focal deficit. However, she had post stroke fatigue and depression. On zoloft. Was put on praluent and then repatha for HLD  treatment due to statin intolerance, however, LDL still not in control. Not able to tolerate Livalo. However, complains of walking difficulty, dizziness and leg weakness, but has graduated from outpt PT/OT. On exam, she likely had cautious gait and psychogenic gait, no parkinsonian signs. EMG/NCS showed mild sensory motor neuropathy, no muscle abnormalities. Her condition still consistent with anxiety/stress related with chronic subjective dizziness. Referred to psychology and PT, did not feel much improvement. Pt saw Dr. Rexene Alberts and without Parkinson features. Again, patient was encouraged with relaxation, building up confidence, positive feedback to help her gait.  Plan:  - continue plavix for stroke prevention.  - continue walking exercise at home and building up confidence.  - relaxation and de-stress are still the key to improve.  - continue zoloft for anxiety and depression - Follow up with your primary care physician for stroke risk factor modification. Recommend maintain blood pressure goal <130/80, diabetes with hemoglobin A1c goal below 7.0% and lipids with LDL cholesterol goal below 70 mg/dL.  - check BP at home and record.  - continue loop recorder monitoring and so far no afib.  - follow up in 6 months.   I spent more than 25 minutes of face to face time with the patient. Greater than 50% of time was spent in counseling and coordination of care. We discussed about gait disorder related to stress/anxiety/low confidence.    No orders of the defined types were placed in this encounter.   No orders of the defined types were placed in this encounter.   Patient Instructions  - continue plavix for stroke prevention.  - continue walking exercise at home and building up confidence.  - relaxation and de-stress are still the key to improve.  - continue zoloft for anxiety and depression - Follow up with your primary care physician for stroke risk factor modification. Recommend maintain blood  pressure goal <130/80, diabetes with hemoglobin A1c goal below 7.0% and lipids with LDL cholesterol goal below 70 mg/dL.  - check BP at home and record.  - continue loop recorder monitoring and so far no afib.  - follow up in 6 months.    Rosalin Hawking, MD PhD [facility] [address] [place], [place] [address] [phone]

## 2017-05-04 NOTE — Patient Instructions (Addendum)
-   continue plavix for stroke prevention.  - continue walking exercise at home and building up confidence.  - relaxation and de-stress are still the key to improve.  - continue zoloft for anxiety and depression - Follow up with your primary care physician for stroke risk factor modification. Recommend maintain blood pressure goal <130/80, diabetes with hemoglobin A1c goal below 7.0% and lipids with LDL cholesterol goal below 70 mg/dL.  - check BP at home and record.  - continue loop recorder monitoring and so far no afib.  - follow up in 6 months.

## 2017-05-05 ENCOUNTER — Other Ambulatory Visit: Payer: Self-pay

## 2017-05-05 ENCOUNTER — Ambulatory Visit (HOSPITAL_COMMUNITY): Payer: PPO | Attending: Cardiology

## 2017-05-05 ENCOUNTER — Encounter: Payer: Self-pay | Admitting: Physician Assistant

## 2017-05-05 ENCOUNTER — Ambulatory Visit (HOSPITAL_BASED_OUTPATIENT_CLINIC_OR_DEPARTMENT_OTHER): Payer: PPO

## 2017-05-05 DIAGNOSIS — E785 Hyperlipidemia, unspecified: Secondary | ICD-10-CM | POA: Insufficient documentation

## 2017-05-05 DIAGNOSIS — R002 Palpitations: Secondary | ICD-10-CM | POA: Diagnosis not present

## 2017-05-05 DIAGNOSIS — Z8249 Family history of ischemic heart disease and other diseases of the circulatory system: Secondary | ICD-10-CM | POA: Diagnosis not present

## 2017-05-05 DIAGNOSIS — I10 Essential (primary) hypertension: Secondary | ICD-10-CM | POA: Diagnosis not present

## 2017-05-05 DIAGNOSIS — I471 Supraventricular tachycardia: Secondary | ICD-10-CM | POA: Diagnosis not present

## 2017-05-05 DIAGNOSIS — I493 Ventricular premature depolarization: Secondary | ICD-10-CM | POA: Diagnosis not present

## 2017-05-05 DIAGNOSIS — R0602 Shortness of breath: Secondary | ICD-10-CM | POA: Insufficient documentation

## 2017-05-05 DIAGNOSIS — Z8673 Personal history of transient ischemic attack (TIA), and cerebral infarction without residual deficits: Secondary | ICD-10-CM | POA: Diagnosis not present

## 2017-05-05 LAB — MYOCARDIAL PERFUSION IMAGING
CHL CUP NUCLEAR SRS: 2
CHL CUP RESTING HR STRESS: 83 {beats}/min
CSEPPHR: 109 {beats}/min
LVDIAVOL: 59 mL (ref 46–106)
LVSYSVOL: 18 mL
NUC STRESS TID: 0.96
RATE: 0.25
SDS: 5
SSS: 7

## 2017-05-05 MED ORDER — REGADENOSON 0.4 MG/5ML IV SOLN
0.4000 mg | Freq: Once | INTRAVENOUS | Status: AC
  Administered 2017-05-05: 0.4 mg via INTRAVENOUS

## 2017-05-05 MED ORDER — TECHNETIUM TC 99M TETROFOSMIN IV KIT
10.1000 | PACK | Freq: Once | INTRAVENOUS | Status: AC | PRN
Start: 2017-05-05 — End: 2017-05-05
  Administered 2017-05-05: 10.1 via INTRAVENOUS
  Filled 2017-05-05: qty 11

## 2017-05-05 MED ORDER — TECHNETIUM TC 99M TETROFOSMIN IV KIT
32.6000 | PACK | Freq: Once | INTRAVENOUS | Status: AC | PRN
  Administered 2017-05-05: 32.6 via INTRAVENOUS
  Filled 2017-05-05: qty 33

## 2017-05-06 ENCOUNTER — Telehealth: Payer: Self-pay | Admitting: *Deleted

## 2017-05-06 ENCOUNTER — Encounter: Payer: Self-pay | Admitting: Physician Assistant

## 2017-05-06 NOTE — Telephone Encounter (Signed)
-----   Message from Liliane Shi, [place] sent at 05/06/2017  3:55 PM EDT ----- Please call the patient. The stress test is abnormal and suggests a possible blockage.  It is possible this may be causing her symptoms.  I discussed with Dr. Acie Fredrickson.  We think she may need a Cardiac Catheterization to further evaluate.  Please keep follow up next week so we can discuss the findings and possible testing to further evaluate.  Please fax a copy of this study result to her PCP:  Deland Pretty, MD  Thanks! Richardson Dopp, PA-C    05/06/2017 3:52 PM

## 2017-05-06 NOTE — Telephone Encounter (Signed)
-----   Message from Liliane Shi, [place] sent at 05/05/2017  5:54 PM EDT ----- Please call the patient. The echocardiogram demonstrates normal heart function with just mildly impaired relaxation. Continue current medications and follow-up as planned Please fax a copy of this study result to her PCP:  Deland Pretty, MD  Thanks! Richardson Dopp, PA-C    05/05/2017 5:52 PM

## 2017-05-06 NOTE — Telephone Encounter (Signed)
Both pt and her husband have been notified of echo results by phone with verbal understanding. Pt and her husband both thanked me for my call. Verified 10/10 appt with Richardson Dopp, PA.

## 2017-05-06 NOTE — Telephone Encounter (Signed)
Pt and her husband called back and both have been notified of Myoview results/findings as well as recommendations by phone with verbal understanding. Both are in agreement to keep f/u 10/10 with Brynda Rim. PA who will go over results further as well as d/w pt and her husband about possible cath. Both pt and her husband asked what is involved in a heart cath. I explained that the PA will go over this further at their appt, though I did briefly explain what a heart cath is. Both the pt and her husband are agreeable to plan of care. I will forward a copy of results to PCP as well.

## 2017-05-06 NOTE — Telephone Encounter (Signed)
Left message to go over Myoview results and recommendation.

## 2017-05-10 ENCOUNTER — Ambulatory Visit (INDEPENDENT_AMBULATORY_CARE_PROVIDER_SITE_OTHER): Payer: PPO | Admitting: *Deleted

## 2017-05-10 DIAGNOSIS — I63231 Cerebral infarction due to unspecified occlusion or stenosis of right carotid arteries: Secondary | ICD-10-CM | POA: Diagnosis not present

## 2017-05-11 ENCOUNTER — Telehealth: Payer: Self-pay | Admitting: Nurse Practitioner

## 2017-05-11 MED ORDER — METOPROLOL SUCCINATE ER 25 MG PO TB24: 25.0000 mg | ORAL_TABLET | Freq: Every day | ORAL | 11 refills | Status: DC

## 2017-05-11 NOTE — Telephone Encounter (Signed)
Noted for Richardson Dopp, PA  Maybe we can switch some people around to accommodate this.

## 2017-05-11 NOTE — Progress Notes (Signed)
Carelink Summary Report / Loop Recorder 

## 2017-05-11 NOTE — Telephone Encounter (Signed)
Spoke with patient to review advice from [place], [place]. Patient had concerns about starting another medication and I explained the need for this specific medication. I answered questions to her satisfaction. She had an appointment with Richardson Dopp, PA on 10/10 but called to reschedule it because she has a cold. She is now scheduled for 10/29. I advised that she will need a sooner appointment and scheduled her to see Truitt Merle, NP on 10/17, which is first availabe. I am sending a message to Scott's primary CMA to see if an opening with Richardson Dopp becomes available for next week since he is very familiar with this patient. I advised patient to call back with questions or concerns prior to follow-up appointment. She verbalized understanding and agreement to start Toprol XL 25 mg once daily. She thanked me for the call.

## 2017-05-11 NOTE — Telephone Encounter (Signed)
-----   Message from Liliane Shi, [place] sent at 05/10/2017  9:56 PM EDT ----- Please call the patient NSR, frequent PVCs (5% burden); occ NSVT, rare SVT The monitor shows frequent PVCs but overall burden is low.  There is also fast heart beats from the ventricle (non-sustained VTach). Recent stress test abnormal and this may be related. PLAN:  1. Start Toprol-XL 25 mg Once daily  2. Keep follow up this week to discuss further Richardson Dopp, PA-C    05/10/2017 9:54 PM

## 2017-05-12 ENCOUNTER — Ambulatory Visit: Payer: Self-pay | Admitting: Physician Assistant

## 2017-05-12 ENCOUNTER — Telehealth: Payer: Self-pay | Admitting: *Deleted

## 2017-05-12 NOTE — Telephone Encounter (Signed)
I can see her: 10/15 at 2:15 10/16 at 11:45 10/19 at 8:45 Whichever appointment time works for her. Richardson Dopp, PA-C    05/12/2017 12:01 PM

## 2017-05-12 NOTE — Telephone Encounter (Signed)
I s/w pt to reschedule her appt to see [place] PA 05/17/17 @ 2:15. Pt is agreeable to date and time change of appt. Pt thanked me for my call.

## 2017-05-12 NOTE — Telephone Encounter (Signed)
Per yesterday phone note from Christen Bame, RN in regards to appt for the pt and new start Toprol XL. Pt was scheduled for today 10/10 though has a cold and rsc to 10/29, though pt needs to be seen sooner. I lmtcb to schedule an appt next to see Brynda Rim. PA.

## 2017-05-13 DIAGNOSIS — R319 Hematuria, unspecified: Secondary | ICD-10-CM | POA: Diagnosis not present

## 2017-05-13 LAB — CUP PACEART REMOTE DEVICE CHECK
MDC IDC PG IMPLANT DT: 20170516
MDC IDC SESS DTM: 20181009023858

## 2017-05-13 NOTE — Telephone Encounter (Signed)
x

## 2017-05-17 ENCOUNTER — Encounter: Payer: Self-pay | Admitting: Physician Assistant

## 2017-05-17 ENCOUNTER — Other Ambulatory Visit: Payer: Self-pay | Admitting: Cardiology

## 2017-05-17 ENCOUNTER — Ambulatory Visit (INDEPENDENT_AMBULATORY_CARE_PROVIDER_SITE_OTHER): Payer: PPO | Admitting: Cardiology

## 2017-05-17 VITALS — BP 140/80 | HR 78 | Ht 62.0 in | Wt 118.8 lb

## 2017-05-17 DIAGNOSIS — I10 Essential (primary) hypertension: Secondary | ICD-10-CM

## 2017-05-17 DIAGNOSIS — E785 Hyperlipidemia, unspecified: Secondary | ICD-10-CM | POA: Diagnosis not present

## 2017-05-17 DIAGNOSIS — Z8673 Personal history of transient ischemic attack (TIA), and cerebral infarction without residual deficits: Secondary | ICD-10-CM

## 2017-05-17 DIAGNOSIS — I493 Ventricular premature depolarization: Secondary | ICD-10-CM | POA: Diagnosis not present

## 2017-05-17 DIAGNOSIS — R0602 Shortness of breath: Secondary | ICD-10-CM | POA: Diagnosis not present

## 2017-05-17 MED ORDER — METOPROLOL SUCCINATE ER 25 MG PO TB24: 12.5000 mg | ORAL_TABLET | Freq: Every day | ORAL | Status: DC

## 2017-05-17 NOTE — Addendum Note (Signed)
Addended by: Michae Kava on: 05/17/2017 05:13 PM   Modules accepted: Orders

## 2017-05-17 NOTE — Patient Instructions (Addendum)
Medication Instructions:  1. Your physician recommends that you continue on your current medications as directed. Please refer to the Current Medication list given to you today.   Labwork: TODAY BMET, CBC, PT/INR  Testing/Procedures: Your physician has requested that you have a cardiac catheterization. Cardiac catheterization is used to diagnose and/or treat various heart conditions. Doctors may recommend this procedure for a number of different reasons. The most common reason is to evaluate chest pain. Chest pain can be a symptom of coronary artery disease (CAD), and cardiac catheterization can show whether plaque is narrowing or blocking your heart's arteries. This procedure is also used to evaluate the valves, as well as measure the blood flow and oxygen levels in different parts of your heart. For further information please visit [email or web address]. Please follow instruction sheet, as given.    Follow-Up: DR. Acie Fredrickson OR CARE TEAM TO BE SEEN IN 1 MONTH  Any Other Special Instructions Will Be Listed Below (If Applicable). If you need a refill on your cardiac medications before your next appointment, please call your pharmacy.    [facility] [address], [place] [address] [place] [address] Dept: [phone] Loc: [facility] [facility]  05/17/2017  You are scheduled for a Cardiac Catheterization on with Dr. Burt Knack 05/25/17 @ 10:30 AM  1. Please arrive at the [facility] (Main Entrance A) at [facility]: [place], [place] [address] at 8 AM (two hours before your procedure to ensure your preparation). Free valet parking service is available.   Special note: Every effort is made to have your procedure done on time. Please understand that emergencies sometimes delay scheduled procedures.  2. Diet: NOTHING TO EAT AFTER MIDNIGHT THE NIGHT BEFORE PROCEDURE  3. Labs: TODAY BMET,  CBC, PT/INR  4. Medication instructions in preparation for your procedure:  On the morning of your procedure, take your Aspirin 81 MG AND PLAVIX THE MORNING OF CATH and any morning medicines NOT listed above.  You may use SMALL sips of water.  5. Plan for one night stay--bring personal belongings. 6. Bring a current list of your medications and current insurance cards. 7. You MUST have a responsible person to drive you home. 8. Someone MUST be with you the first 24 hours after you arrive home or your discharge will be delayed. 9. Please wear clothes that are easy to get on and off and wear slip-on shoes.  Thank you for allowing [place] to care for you!   -- [facility]

## 2017-05-17 NOTE — Progress Notes (Signed)
Cardiology Office Note:    Date:  05/17/2017   ID:  Stacey Ramirez, DOB 1941/04/04, MRN [id number]  PCP:  Deland Pretty, MD  Cardiologist:  Dr Acie Fredrickson  Referring MD: Deland Pretty, MD   Chief Complaint  Patient presents with  . Shortness of Breath  . Follow-up    History of Present Illness:    Stacey Ramirez is a 76 y.o. female with a past medical history significant for Palpitations, hypertension, hyperlipidemia, prior CVA. The patient was admitted and 12/2015 with cryptogenic stroke. Loop recorder was implanted. Thus far, no arrhythmias have been identified.   She has been having complaints of shortness of breath. She was last seen by Richardson Dopp, PA at which time she continued to have some shortness of breath but no chest discomfort, orthopnea, PND or syncope. She continues to have left-sided weakness from her stroke. She has chronic palpitations. A stress test, echo and Holter monitor were ordered. Echocardiogram showed normal LV function without any regional wall motion abnormalities and grade 1 diastolic dysfunction. Stress test showed partially reversible medium-sized, mild basal to mid inferior lateral perfusion defect and was considered intermediate risk. EF 69% with normal wall motion. Holter monitor showed about a 5% PVC burden and Toprol-XL 25 mg was added.  Today Stacey Ramirez is here today With her husband. She continues to have shortness of breath with activity, as has had for the past few months. No chest pain, palpitations, lightheadedness, orthopnea, PND or edema. After starting the Toprol XL 25 mg her heart rate was down to the 50's and BP 100-110/60's and pt was lightheaded per her husband. He cut her pill in half for the last 2 days and pt no longer symptomatic. She says she is very sensitive to medications.   She is quite nervous about the abnormal stress test finding and potential further procedures.    Past Medical History:  Diagnosis Date  . Accelerated hypertension  12/28/2015  . Acute CVA (cerebrovascular accident) ([place]) 12/14/2015  . Adjustment reaction with anxiety 12/20/2015  . Anxiety 12/29/2016  . Aortic stenosis   . Cerebrovascular accident (stroke) ([place])    a. 12/2015 - cryptogenic.  S/P MDT Linq.  . Difficulty walking   . Essential hypertension   . Gait disorder 04/07/2016  . Headache 12/28/2015  . Heart palpitations 09/06/2012  . History of CVA with residual deficit   . History of dizziness   . History of echocardiogram    Echo 10/18: Vigorous LVF, EF 65-70, normal wall motion, grade 1 diastolic dysfunction, GLS -21.1%, mild RAE  . History of nuclear stress test    Nuclear stress test 10/18: EF 69, inf-lat defect c/w poss infarct with peri-infarct ischemia; Intermediate Risk  . History of stroke   . Hypercholesterolemia   . Hyperlipidemia   . Hypertension   . Hypothyroidism   . Intracranial carotid stenosis 02/27/2016  . Left hemiparesis ([place])   . Left-sided weakness 12/28/2015  . LVH (left ventricular hypertrophy)   . Mitral regurgitation    a. 12/2015 Echo: EF 60-65%, mild focal basal hypertrophy. No rwma, triv AI, mild MR, mildly dil LA w/o thrombus. No RA thrombus. No PFO.  Marland Kitchen Palpitations   . Prediabetes   . Right Basal ganglia infarction ([place]) 12/14/2015  . SOB (shortness of breath)   . Stroke ([place]) 12/13/2015  . Stroke due to embolism of right middle cerebral artery ([place]) 12/17/2015  . Stroke-like symptom 12/13/2015  . SVT (supraventricular tachycardia) ([place]) 12/14/2015  . Vascular parkinsonism (  [place]) 06/29/2016  . Vitamin D deficiency     Past Surgical History:  Procedure Laterality Date  . CARDIOVASCULAR STRESS TEST  2002   NORMAL  . EP IMPLANTABLE DEVICE N/A 12/17/2015   Procedure: Loop Recorder Insertion;  Surgeon: [name], MD;  Location: [facility];  Service: Cardiovascular;  Laterality: N/A;  . LOOP RECORDER IMPLANT    . TEE WITHOUT CARDIOVERSION N/A 12/17/2015   Procedure: TRANSESOPHAGEAL ECHOCARDIOGRAM (TEE);   Surgeon: Lelon Perla, MD;  Location: [facility] ENDOSCOPY;  Service: Cardiovascular;  Laterality: N/A;  Pt also needs a LOOP  . TRANSTHORACIC ECHOCARDIOGRAM  2008   SHOWED LEFT VENTRICULAR HYPERTROPHY AND MILD AORTIC STENOSIS    Current Medications: Current Meds  Medication Sig  . ALPRAZolam (XANAX) 0.25 MG tablet Take 0.25 mg by mouth at bedtime as needed for anxiety or sleep.  Marland Kitchen amLODipine (NORVASC) 5 MG tablet Take 1 tablet (5 mg total) by mouth daily.  . Ca Phosphate-Cholecalciferol [id number] MG-UNIT TABS Take 2,000 Units by mouth daily.  . carboxymethylcellulose (REFRESH PLUS) 0.5 % SOLN Place 1 drop into both eyes 3 (three) times daily as needed (DRY EYE).   Marland Kitchen clopidogrel (PLAVIX) 75 MG tablet Take 1 tablet (75 mg total) by mouth daily.  Marland Kitchen docusate sodium (COLACE) 100 MG capsule Take 100 mg by mouth daily as needed for mild constipation or moderate constipation.   Marland Kitchen estradiol (ESTRACE) 0.1 MG/GM vaginal cream Place 2 g vaginally as needed (dryness).   . irbesartan (AVAPRO) 300 MG tablet Take 1 tablet (300 mg total) by mouth daily.  Marland Kitchen levothyroxine (SYNTHROID, LEVOTHROID) 25 MCG tablet Take 25 mcg by mouth daily before breakfast.   . loratadine (CLARITIN) 10 MG tablet Take 10 mg by mouth daily as needed for allergies.   Marland Kitchen LOTEMAX 0.5 % ophthalmic suspension Place 1 drop into both eyes 4 (four) times daily as needed. For dry itchy eyes  . metoprolol succinate (TOPROL XL) 25 MG 24 hr tablet Take 1 tablet (25 mg total) by mouth daily.  . Multiple Vitamin (MULTIVITAMIN) tablet Take 1 tablet by mouth daily.  . polyethylene glycol (MIRALAX / GLYCOLAX) packet Take 17 g by mouth daily as needed for mild constipation.  . sertraline (ZOLOFT) 50 MG tablet Take 50 mg by mouth daily.      Allergies:   Sulfa drugs cross reactors; Zocor [simvastatin]; Crestor [rosuvastatin calcium]; Statins; and Micardis [telmisartan]   Social History   Social History  . Marital status: Married    Spouse name: N/A    . Number of children: 2  . Years of education: college   Occupational History  . retired    Social History Main Topics  . Smoking status: Never Smoker  . Smokeless tobacco: Never Used  . Alcohol use 0.6 oz/week    1 Glasses of wine per week     Comment: socially  . Drug use: No  . Sexual activity: Not Asked   Other Topics Concern  . None   Social History Narrative  . None     Family History: The patient's family history includes Alzheimer's disease in her mother; Heart attack in her mother; Stroke in her paternal uncle. ROS:   Please see the history of present illness.     All other systems reviewed and are negative.  EKGs/Labs/Other Studies Reviewed:    The following studies were reviewed today:  Lexiscan Myoview 05/05/17 Study Highlights    Nuclear stress EF: 69%.  There was no ST segment deviation  noted during stress.  Findings consistent with ischemia.  This is an intermediate risk study.   1. EF 69%, normal wall motion.  2. Partially reversible medium-sized, mild basal to mid inferolateral perfusion defect.  Possible small infarction with peri-infarct ischemia.    Intermediate risk study.    Echocardiogram 06/01/2017 Study Conclusions  - Left ventricle: The cavity size was normal. Wall thickness was   normal. Systolic function was vigorous. The estimated ejection   fraction was in the range of 65% to 70%. Wall motion was normal;   there were no regional wall motion abnormalities. Doppler   parameters are consistent with abnormal left ventricular   relaxation (grade 1 diastolic dysfunction). GLS: -21.1% - Right atrium: The atrium was mildly dilated.  EKG:  EKG is not ordered today.    Recent Labs: 04/25/2017: ALT 18; BUN 12; Creatinine, Ser 0.71; Hemoglobin 14.3; Platelets 219; Potassium 3.6; Sodium 140   Recent Lipid Panel    Component Value Date/Time   CHOL 315 (H) 12/13/2015 1558   CHOL 331 (H) 04/14/2013 0921   TRIG 59 12/13/2015 1558    HDL 65 12/13/2015 1558   HDL 65 04/14/2013 0921   CHOLHDL 4.8 12/13/2015 1558   VLDL 12 12/13/2015 1558   LDLCALC 238 (H) 12/13/2015 1558   LDLCALC 244 (H) 04/14/2013 0921   LDLDIRECT 224.6 12/14/2011 0919    Physical Exam:    VS:  BP 140/80   Pulse 78   Ht 5\' 2"  (1.575 m)   Wt 118 lb 12.8 oz (53.9 kg)   SpO2 98%   BMI 21.73 kg/m     Wt Readings from Last 3 Encounters:  05/17/17 118 lb 12.8 oz (53.9 kg)  05/05/17 119 lb (54 kg)  05/04/17 121 lb 9.6 oz (55.2 kg)     Physical Exam  Constitutional: She is oriented to person, place, and time. She appears well-developed and well-nourished. No distress.  HENT:  Head: Normocephalic and atraumatic.  Neck: Normal range of motion. Neck supple. No JVD present.  Cardiovascular: Normal rate, regular rhythm and normal heart sounds.  Exam reveals no gallop and no friction rub.   No murmur heard. Pulmonary/Chest: Effort normal and breath sounds normal. No respiratory distress.  Abdominal: Soft. Bowel sounds are normal. She exhibits no distension. There is no tenderness.  Musculoskeletal: Normal range of motion. She exhibits no edema or deformity.  Neurological: She is alert and oriented to person, place, and time.  Skin: Skin is warm and dry.  Psychiatric: She has a normal mood and affect. Her behavior is normal. Thought content normal.  Vitals reviewed.    ASSESSMENT:    1. SOB (shortness of breath)   2. PVC's (premature ventricular contractions)   3. Essential hypertension   4. History of stroke   5. Hyperlipidemia, unspecified hyperlipidemia type    PLAN:    In order of problems listed above:  1. Shortness of breath: Patient has been having complaints of shortness of breath for a couple of months. No chest pain. Echocardiogram showed normal LV function without any regional wall motion abnormalities and grade 1 diastolic dysfunction. Stress test showed partially reversible medium-sized, mild basal to mid inferior lateral  perfusion defect and was considered intermediate risk. EF 69% with normal wall motion. I had a long discussion with the patient and her husband about her test results and possible myocardial ischemia being the cause of her shortness of breath and recommendation for cardiac catheterization. Also discussed her high cholesterol, inability to tolerate  statins and hx of stroke adding to the increased possibility of significant coronary artery plaque buildup.  Benefits and risks were discussed in detail including but are not limited to stroke (1 in 1000), death (1 in 1000), kidney failure [usually temporary] (1 in 500), bleeding (1 in 200), allergic reaction [possibly serious] (1 in 200). After some contemplation with her husband she was just to proceed with cardiac catheterization which has been arranged for October 23 with Dr. Burt Knack. The patient has normal renal function. We will check labs including BMP, CBC, PT/INR. She is instructed to take an 81 mg aspirin procedure.  2. PVCs:  A 24-hour Holter was done and resulted on 05/10/17 showing sinus rhythm with frequent PVCs (5% burden), and rare SVT. Toprol-XL 25 mg was initiated by phone call. The patient had heart rate in the 50s and somewhat low blood pressure and was symptomatic. Her husband has cut her dose in half and we will continue that for now.  3. Essential hypertension: Blood pressure is well controlled on current regimen.  4. History of stroke: Patient is status post loop recorder. Thus far atrial fibrillation has not been identified. Last download 05/11/17. She has normal speech and has regained most of her functioning of her extremities except that she is still somewhat weak. The patient is on Plavix. She is unable to be on statin due to intolerance.  5. Hyperlipidemia: Unable to tolerate any statin and PCSK9-I was not effective in lowering cholesterol. Last LDL was 238 with HDL 65 in 12/2015.   Medication Adjustments/Labs and Tests  Ordered: Current medicines are reviewed at length with the patient today.  Concerns regarding medicines are outlined above. Labs and tests ordered and medication changes are outlined in the patient instructions below:  Patient Instructions  Medication Instructions:  1. Your physician recommends that you continue on your current medications as directed. Please refer to the Current Medication list given to you today.   Labwork: TODAY BMET, CBC, PT/INR  Testing/Procedures: Your physician has requested that you have a cardiac catheterization. Cardiac catheterization is used to diagnose and/or treat various heart conditions. Doctors may recommend this procedure for a number of different reasons. The most common reason is to evaluate chest pain. Chest pain can be a symptom of coronary artery disease (CAD), and cardiac catheterization can show whether plaque is narrowing or blocking your heart's arteries. This procedure is also used to evaluate the valves, as well as measure the blood flow and oxygen levels in different parts of your heart. For further information please visit [email or web address]. Please follow instruction sheet, as given.    Follow-Up: DR. Acie Fredrickson OR CARE TEAM TO BE SEEN IN 1 MONTH  Any Other Special Instructions Will Be Listed Below (If Applicable). If you need a refill on your cardiac medications before your next appointment, please call your pharmacy.    [facility] [address] [place] [address] [place] [address] Dept: [phone] Loc: [place] [facility] [place]  05/17/2017  You are scheduled for a Cardiac Catheterization on with Dr. Burt Knack 05/25/17 @ 10:30 AM  1. Please arrive at the [facility] (Main Entrance A) at [facility]: [place],  [address] at 8 AM (two hours before your procedure to ensure your preparation). Free valet parking service is  available.   Special note: Every effort is made to have your procedure done on time. Please understand that emergencies sometimes delay scheduled  procedures.  2. Diet: NOTHING TO EAT AFTER MIDNIGHT THE NIGHT BEFORE PROCEDURE  3. Labs: TODAY BMET, CBC, PT/INR  4. Medication instructions in preparation for your procedure:  On the morning of your procedure, take your Aspirin 81 MG AND PLAVIX THE MORNING OF CATH and any morning medicines NOT listed above.  You may use SMALL sips of water.  5. Plan for one night stay--bring personal belongings. 6. Bring a current list of your medications and current insurance cards. 7. You MUST have a responsible person to drive you home. 8. Someone MUST be with you the first 24 hours after you arrive home or your discharge will be delayed. 9. Please wear clothes that are easy to get on and off and wear slip-on shoes.  Thank you for allowing [place] to care for you!   -- [facility] Invasive Cardiovascular services      Signed, Daune Perch, NP  05/17/2017 4:44 PM    [place]

## 2017-05-18 ENCOUNTER — Telehealth: Payer: Self-pay | Admitting: *Deleted

## 2017-05-18 DIAGNOSIS — R310 Gross hematuria: Secondary | ICD-10-CM | POA: Diagnosis not present

## 2017-05-18 DIAGNOSIS — N390 Urinary tract infection, site not specified: Secondary | ICD-10-CM | POA: Diagnosis not present

## 2017-05-18 DIAGNOSIS — N201 Calculus of ureter: Secondary | ICD-10-CM | POA: Diagnosis not present

## 2017-05-18 DIAGNOSIS — R319 Hematuria, unspecified: Secondary | ICD-10-CM | POA: Diagnosis not present

## 2017-05-18 LAB — CBC
HEMOGLOBIN: 15.2 g/dL (ref 11.1–15.9)
Hematocrit: 45.2 % (ref 34.0–46.6)
MCH: 31.6 pg (ref 26.6–33.0)
MCHC: 33.6 g/dL (ref 31.5–35.7)
MCV: 94 fL (ref 79–97)
Platelets: 293 10*3/uL (ref 150–379)
RBC: 4.81 x10E6/uL (ref 3.77–5.28)
RDW: 13.2 % (ref 12.3–15.4)
WBC: 7.5 10*3/uL (ref 3.4–10.8)

## 2017-05-18 LAB — BASIC METABOLIC PANEL
BUN/Creatinine Ratio: 18 (ref 12–28)
BUN: 13 mg/dL (ref 8–27)
CALCIUM: 10.5 mg/dL — AB (ref 8.7–10.3)
CHLORIDE: 99 mmol/L (ref 96–106)
CO2: 26 mmol/L (ref 20–29)
Creatinine, Ser: 0.72 mg/dL (ref 0.57–1.00)
GFR calc Af Amer: 94 mL/min/{1.73_m2} (ref >59)
GFR calc non Af Amer: 82 mL/min/{1.73_m2} (ref >59)
GLUCOSE: 92 mg/dL (ref 65–99)
Potassium: 4.3 mmol/L (ref 3.5–5.2)
Sodium: 143 mmol/L (ref 134–144)

## 2017-05-18 LAB — PROTIME-INR
INR: 1 (ref 0.8–1.2)
Prothrombin Time: 9.9 s (ref 9.1–12.0)

## 2017-05-18 NOTE — Telephone Encounter (Signed)
DPR ok to s/w pt's husband. Pt's husband has been notified of lab results by phone and that pt is all set to proceed with cath on 05/25/17. Pt's husband thanked me for my call.

## 2017-05-18 NOTE — Telephone Encounter (Signed)
-----   Message from Daune Perch, NP sent at 05/18/2017  7:58 AM EDT ----- Please let pt know that all of her labs were normal. She is set to proceed with cath as scheduled.  Daune Perch, NP

## 2017-05-18 NOTE — Telephone Encounter (Signed)
New Message  Pt returning call. Please call back to discus

## 2017-05-19 ENCOUNTER — Ambulatory Visit: Payer: Self-pay | Admitting: Nurse Practitioner

## 2017-05-20 DIAGNOSIS — M7661 Achilles tendinitis, right leg: Secondary | ICD-10-CM | POA: Diagnosis not present

## 2017-05-24 ENCOUNTER — Telehealth: Payer: Self-pay

## 2017-05-24 DIAGNOSIS — I10 Essential (primary) hypertension: Secondary | ICD-10-CM | POA: Diagnosis not present

## 2017-05-24 NOTE — Telephone Encounter (Signed)
Patient contacted pre-catheterization at [facility] scheduled for:  05/25/2017 @ 1030 Verified arrival time and place:  [facility] @ 0800 Confirmed AM meds to be taken pre-cath with sip of water: Take ASA/Plavix Confirmed patient has responsible person to drive home post procedure and observe patient for 24 hours:  yes Addl concerns:  none

## 2017-05-25 ENCOUNTER — Ambulatory Visit (HOSPITAL_COMMUNITY)
Admission: RE | Admit: 2017-05-25 | Discharge: 2017-05-25 | Disposition: A | Discharge status: Home / Self Care | Payer: PPO | Source: Ambulatory Visit | Attending: Cardiovascular Disease | Admitting: Cardiovascular Disease

## 2017-05-25 ENCOUNTER — Encounter (HOSPITAL_COMMUNITY)
Admission: RE | Disposition: A | Discharge status: Home / Self Care | Payer: Self-pay | Source: Ambulatory Visit | Attending: Cardiovascular Disease

## 2017-05-25 DIAGNOSIS — R7303 Prediabetes: Secondary | ICD-10-CM | POA: Diagnosis not present

## 2017-05-25 DIAGNOSIS — E039 Hypothyroidism, unspecified: Secondary | ICD-10-CM | POA: Diagnosis not present

## 2017-05-25 DIAGNOSIS — I2510 Atherosclerotic heart disease of native coronary artery without angina pectoris: Secondary | ICD-10-CM | POA: Diagnosis not present

## 2017-05-25 DIAGNOSIS — I2584 Coronary atherosclerosis due to calcified coronary lesion: Secondary | ICD-10-CM | POA: Insufficient documentation

## 2017-05-25 DIAGNOSIS — I10 Essential (primary) hypertension: Secondary | ICD-10-CM | POA: Insufficient documentation

## 2017-05-25 DIAGNOSIS — I340 Nonrheumatic mitral (valve) insufficiency: Secondary | ICD-10-CM | POA: Diagnosis not present

## 2017-05-25 DIAGNOSIS — Z7902 Long term (current) use of antithrombotics/antiplatelets: Secondary | ICD-10-CM | POA: Insufficient documentation

## 2017-05-25 DIAGNOSIS — F4322 Adjustment disorder with anxiety: Secondary | ICD-10-CM | POA: Diagnosis not present

## 2017-05-25 DIAGNOSIS — I493 Ventricular premature depolarization: Secondary | ICD-10-CM | POA: Diagnosis not present

## 2017-05-25 DIAGNOSIS — G20 Parkinson's disease: Secondary | ICD-10-CM | POA: Diagnosis not present

## 2017-05-25 DIAGNOSIS — Z882 Allergy status to sulfonamides status: Secondary | ICD-10-CM | POA: Insufficient documentation

## 2017-05-25 DIAGNOSIS — E559 Vitamin D deficiency, unspecified: Secondary | ICD-10-CM | POA: Diagnosis not present

## 2017-05-25 DIAGNOSIS — I471 Supraventricular tachycardia: Secondary | ICD-10-CM | POA: Insufficient documentation

## 2017-05-25 DIAGNOSIS — I69354 Hemiplegia and hemiparesis following cerebral infarction affecting left non-dominant side: Secondary | ICD-10-CM | POA: Diagnosis not present

## 2017-05-25 DIAGNOSIS — R931 Abnormal findings on diagnostic imaging of heart and coronary circulation: Secondary | ICD-10-CM | POA: Diagnosis not present

## 2017-05-25 DIAGNOSIS — E7800 Pure hypercholesterolemia, unspecified: Secondary | ICD-10-CM | POA: Diagnosis not present

## 2017-05-25 HISTORY — PX: LEFT HEART CATH AND CORONARY ANGIOGRAPHY: CATH118249

## 2017-05-25 SURGERY — LEFT HEART CATH AND CORONARY ANGIOGRAPHY
Anesthesia: LOCAL

## 2017-05-25 MED ORDER — MIDAZOLAM HCL 2 MG/2ML IJ SOLN
INTRAMUSCULAR | Status: AC
  Filled 2017-05-25: qty 2

## 2017-05-25 MED ORDER — SODIUM CHLORIDE 0.9 % WEIGHT BASED INFUSION: 1.0000 mL/kg/h | INTRAVENOUS | Status: DC

## 2017-05-25 MED ORDER — LIDOCAINE HCL 2 % IJ SOLN
INTRAMUSCULAR | Status: DC | PRN
  Administered 2017-05-25: 2 mL

## 2017-05-25 MED ORDER — FENTANYL CITRATE (PF) 100 MCG/2ML IJ SOLN
INTRAMUSCULAR | Status: DC | PRN
  Administered 2017-05-25: 25 ug via INTRAVENOUS

## 2017-05-25 MED ORDER — IOPAMIDOL (ISOVUE-370) INJECTION 76%
INTRAVENOUS | Status: AC
  Filled 2017-05-25: qty 100

## 2017-05-25 MED ORDER — SODIUM CHLORIDE 0.9% FLUSH: 3.0000 mL | Freq: Two times a day (BID) | INTRAVENOUS | Status: DC

## 2017-05-25 MED ORDER — HEPARIN SODIUM (PORCINE) 1000 UNIT/ML IJ SOLN
INTRAMUSCULAR | Status: AC
  Filled 2017-05-25: qty 1

## 2017-05-25 MED ORDER — ASPIRIN 81 MG PO CHEW: 81.0000 mg | CHEWABLE_TABLET | ORAL | Status: DC

## 2017-05-25 MED ORDER — MIDAZOLAM HCL 2 MG/2ML IJ SOLN
INTRAMUSCULAR | Status: DC | PRN
  Administered 2017-05-25 (×2): 1 mg via INTRAVENOUS

## 2017-05-25 MED ORDER — ONDANSETRON HCL 4 MG/2ML IJ SOLN: 4.0000 mg | Freq: Four times a day (QID) | INTRAMUSCULAR | Status: DC | PRN

## 2017-05-25 MED ORDER — SODIUM CHLORIDE 0.9 % IV SOLN: 250.0000 mL | INTRAVENOUS | Status: DC | PRN

## 2017-05-25 MED ORDER — HEPARIN SODIUM (PORCINE) 1000 UNIT/ML IJ SOLN
INTRAMUSCULAR | Status: DC | PRN
  Administered 2017-05-25: 3000 [IU] via INTRAVENOUS

## 2017-05-25 MED ORDER — VERAPAMIL HCL 2.5 MG/ML IV SOLN
INTRAVENOUS | Status: DC | PRN
  Administered 2017-05-25: 10 mL via INTRA_ARTERIAL

## 2017-05-25 MED ORDER — SODIUM CHLORIDE 0.9 % WEIGHT BASED INFUSION
3.0000 mL/kg/h | INTRAVENOUS | Status: AC
  Administered 2017-05-25: 3 mL/kg/h via INTRAVENOUS

## 2017-05-25 MED ORDER — IOPAMIDOL (ISOVUE-370) INJECTION 76%
INTRAVENOUS | Status: DC | PRN
  Administered 2017-05-25: 40 mL

## 2017-05-25 MED ORDER — VERAPAMIL HCL 2.5 MG/ML IV SOLN
INTRAVENOUS | Status: AC
  Filled 2017-05-25: qty 2

## 2017-05-25 MED ORDER — SODIUM CHLORIDE 0.9% FLUSH: 3.0000 mL | INTRAVENOUS | Status: DC | PRN

## 2017-05-25 MED ORDER — HEPARIN (PORCINE) IN NACL 2-0.9 UNIT/ML-% IJ SOLN
INTRAMUSCULAR | Status: AC
  Filled 2017-05-25: qty 1000

## 2017-05-25 MED ORDER — LIDOCAINE HCL 2 % IJ SOLN
INTRAMUSCULAR | Status: AC
  Filled 2017-05-25: qty 10

## 2017-05-25 MED ORDER — FENTANYL CITRATE (PF) 100 MCG/2ML IJ SOLN
INTRAMUSCULAR | Status: AC
  Filled 2017-05-25: qty 2

## 2017-05-25 MED ORDER — ACETAMINOPHEN 325 MG PO TABS: 650.0000 mg | ORAL_TABLET | ORAL | Status: DC | PRN

## 2017-05-25 MED ORDER — HEPARIN (PORCINE) IN NACL 2-0.9 UNIT/ML-% IJ SOLN
INTRAMUSCULAR | Status: DC | PRN
  Administered 2017-05-25: 13:00:00

## 2017-05-25 SURGICAL SUPPLY — 10 items
CATH IMPULSE 5F ANG/FL3.5 (CATHETERS) ×2 IMPLANT
DEVICE RAD TR BAND REGULAR (VASCULAR PRODUCTS) ×2 IMPLANT
GLIDESHEATH SLEND SS 6F .021 (SHEATH) ×2 IMPLANT
GUIDEWIRE INQWIRE 1.5J.035X260 (WIRE) ×1 IMPLANT
INQWIRE 1.5J .035X260CM (WIRE) ×2
KIT HEART LEFT (KITS) ×2 IMPLANT
PACK CARDIAC CATHETERIZATION (CUSTOM PROCEDURE TRAY) ×2 IMPLANT
TRANSDUCER W/STOPCOCK (MISCELLANEOUS) ×2 IMPLANT
TUBING CIL FLEX 10 FLL-RA (TUBING) ×2 IMPLANT
WIRE HI TORQ VERSACORE-J 145CM (WIRE) ×2 IMPLANT

## 2017-05-25 NOTE — Interval H&P Note (Signed)
  Cath Lab Visit (complete for each Cath Lab visit)  Clinical Evaluation Leading to the Procedure:   ACS: No.  Non-ACS:    Anginal Classification: CCS III  Anti-ischemic medical therapy: Minimal Therapy (1 class of medications)  Non-Invasive Test Results: Intermediate-risk stress test findings: cardiac mortality 1-3%/year  Prior CABG: No previous CABG      History and Physical Interval Note:  05/25/2017 12:29 PM  Stacey Ramirez  has presented today for surgery, with the diagnosis of shortness of breath - cp  The various methods of treatment have been discussed with the patient and family. After consideration of risks, benefits and other options for treatment, the patient has consented to  Procedure(s): LEFT HEART CATH AND CORONARY ANGIOGRAPHY (N/A) as a surgical intervention .  The patient's history has been reviewed, patient examined, no change in status, stable for surgery.  I have reviewed the patient's chart and labs.  Questions were answered to the patient's satisfaction.     Sherren Mocha

## 2017-05-25 NOTE — H&P (View-Only) (Signed)
Cardiology Office Note:    Date:  05/17/2017   ID:  Stacey Ramirez, DOB 1940/08/09, MRN [id number]  PCP:  Stacey Pretty, MD  Cardiologist:  Dr Acie Fredrickson  Referring MD: Stacey Pretty, MD   Chief Complaint  Patient presents with  . Shortness of Breath  . Follow-up    History of Present Illness:    Stacey Ramirez is a 76 y.o. female with a past medical history significant for Palpitations, hypertension, hyperlipidemia, prior CVA. The patient was admitted and 12/2015 with cryptogenic stroke. Loop recorder was implanted. Thus far, no arrhythmias have been identified.   She has been having complaints of shortness of breath. She was last seen by Stacey Dopp, PA at which time she continued to have some shortness of breath but no chest discomfort, orthopnea, PND or syncope. She continues to have left-sided weakness from her stroke. She has chronic palpitations. A stress test, echo and Holter monitor were ordered. Echocardiogram showed normal LV function without any regional wall motion abnormalities and grade 1 diastolic dysfunction. Stress test showed partially reversible medium-sized, mild basal to mid inferior lateral perfusion defect and was considered intermediate risk. EF 69% with normal wall motion. Holter monitor showed about a 5% PVC burden and Toprol-XL 25 mg was added.  Today Ms Stacey Ramirez is here today With her husband. She continues to have shortness of breath with activity, as has had for the past few months. No chest pain, palpitations, lightheadedness, orthopnea, PND or edema. After starting the Toprol XL 25 mg her heart rate was down to the 50's and BP 100-110/60's and pt was lightheaded per her husband. He cut her pill in half for the last 2 days and pt no longer symptomatic. She says she is very sensitive to medications.   She is quite nervous about the abnormal stress test finding and potential further procedures.    Past Medical History:  Diagnosis Date  . Accelerated hypertension  12/28/2015  . Acute CVA (cerebrovascular accident) ([place]) 12/14/2015  . Adjustment reaction with anxiety 12/20/2015  . Anxiety 12/29/2016  . Aortic stenosis   . Cerebrovascular accident (stroke) ([place])    a. 12/2015 - cryptogenic.  S/P MDT Linq.  . Difficulty walking   . Essential hypertension   . Gait disorder 04/07/2016  . Headache 12/28/2015  . Heart palpitations 09/06/2012  . History of CVA with residual deficit   . History of dizziness   . History of echocardiogram    Echo 10/18: Vigorous LVF, EF 65-70, normal wall motion, grade 1 diastolic dysfunction, GLS -21.1%, mild RAE  . History of nuclear stress test    Nuclear stress test 10/18: EF 69, inf-lat defect c/w poss infarct with peri-infarct ischemia; Intermediate Risk  . History of stroke   . Hypercholesterolemia   . Hyperlipidemia   . Hypertension   . Hypothyroidism   . Intracranial carotid stenosis 02/27/2016  . Left hemiparesis ([place])   . Left-sided weakness 12/28/2015  . LVH (left ventricular hypertrophy)   . Mitral regurgitation    a. 12/2015 Echo: EF 60-65%, mild focal basal hypertrophy. No rwma, triv AI, mild MR, mildly dil LA w/o thrombus. No RA thrombus. No PFO.  Stacey Ramirez Palpitations   . Prediabetes   . Right Basal ganglia infarction ([place]) 12/14/2015  . SOB (shortness of breath)   . Stroke ([place]) 12/13/2015  . Stroke due to embolism of right middle cerebral artery ([place]) 12/17/2015  . Stroke-like symptom 12/13/2015  . SVT (supraventricular tachycardia) ([place]) 12/14/2015  . Vascular parkinsonism (  [place]) 06/29/2016  . Vitamin D deficiency     Past Surgical History:  Procedure Laterality Date  . CARDIOVASCULAR STRESS TEST  2002   NORMAL  . EP IMPLANTABLE DEVICE N/A 12/17/2015   Procedure: Loop Recorder Insertion;  Surgeon: [name], MD;  Location: [facility];  Service: Cardiovascular;  Laterality: N/A;  . LOOP RECORDER IMPLANT    . TEE WITHOUT CARDIOVERSION N/A 12/17/2015   Procedure: TRANSESOPHAGEAL ECHOCARDIOGRAM (TEE);   Surgeon: Lelon Perla, MD;  Location: [facility] ENDOSCOPY;  Service: Cardiovascular;  Laterality: N/A;  Pt also needs a LOOP  . TRANSTHORACIC ECHOCARDIOGRAM  2008   SHOWED LEFT VENTRICULAR HYPERTROPHY AND MILD AORTIC STENOSIS    Current Medications: Current Meds  Medication Sig  . ALPRAZolam (XANAX) 0.25 MG tablet Take 0.25 mg by mouth at bedtime as needed for anxiety or sleep.  Stacey Ramirez amLODipine (NORVASC) 5 MG tablet Take 1 tablet (5 mg total) by mouth daily.  . Ca Phosphate-Cholecalciferol [phone] MG-UNIT TABS Take 2,000 Units by mouth daily.  . carboxymethylcellulose (REFRESH PLUS) 0.5 % SOLN Place 1 drop into both eyes 3 (three) times daily as needed (DRY EYE).   Stacey Ramirez clopidogrel (PLAVIX) 75 MG tablet Take 1 tablet (75 mg total) by mouth daily.  Stacey Ramirez docusate sodium (COLACE) 100 MG capsule Take 100 mg by mouth daily as needed for mild constipation or moderate constipation.   Stacey Ramirez estradiol (ESTRACE) 0.1 MG/GM vaginal cream Place 2 g vaginally as needed (dryness).   . irbesartan (AVAPRO) 300 MG tablet Take 1 tablet (300 mg total) by mouth daily.  Stacey Ramirez levothyroxine (SYNTHROID, LEVOTHROID) 25 MCG tablet Take 25 mcg by mouth daily before breakfast.   . loratadine (CLARITIN) 10 MG tablet Take 10 mg by mouth daily as needed for allergies.   Stacey Ramirez LOTEMAX 0.5 % ophthalmic suspension Place 1 drop into both eyes 4 (four) times daily as needed. For dry itchy eyes  . metoprolol succinate (TOPROL XL) 25 MG 24 hr tablet Take 1 tablet (25 mg total) by mouth daily.  . Multiple Vitamin (MULTIVITAMIN) tablet Take 1 tablet by mouth daily.  . polyethylene glycol (MIRALAX / GLYCOLAX) packet Take 17 g by mouth daily as needed for mild constipation.  . sertraline (ZOLOFT) 50 MG tablet Take 50 mg by mouth daily.      Allergies:   Sulfa drugs cross reactors; Zocor [simvastatin]; Crestor [rosuvastatin calcium]; Statins; and Micardis [telmisartan]   Social History   Social History  . Marital status: Married    Spouse name: N/A    . Number of children: 2  . Years of education: college   Occupational History  . retired    Social History Main Topics  . Smoking status: Never Smoker  . Smokeless tobacco: Never Used  . Alcohol use 0.6 oz/week    1 Glasses of wine per week     Comment: socially  . Drug use: No  . Sexual activity: Not Asked   Other Topics Concern  . None   Social History Narrative  . None     Family History: The patient's family history includes Alzheimer's disease in her mother; Heart attack in her mother; Stroke in her paternal uncle. ROS:   Please see the history of present illness.     All other systems reviewed and are negative.  EKGs/Labs/Other Studies Reviewed:    The following studies were reviewed today:  Lexiscan Myoview 05/05/17 Study Highlights    Nuclear stress EF: 69%.  There was no ST segment deviation  noted during stress.  Findings consistent with ischemia.  This is an intermediate risk study.   1. EF 69%, normal wall motion.  2. Partially reversible medium-sized, mild basal to mid inferolateral perfusion defect.  Possible small infarction with peri-infarct ischemia.    Intermediate risk study.    Echocardiogram 06/01/2017 Study Conclusions  - Left ventricle: The cavity size was normal. Wall thickness was   normal. Systolic function was vigorous. The estimated ejection   fraction was in the range of 65% to 70%. Wall motion was normal;   there were no regional wall motion abnormalities. Doppler   parameters are consistent with abnormal left ventricular   relaxation (grade 1 diastolic dysfunction). GLS: -21.1% - Right atrium: The atrium was mildly dilated.  EKG:  EKG is not ordered today.    Recent Labs: 04/25/2017: ALT 18; BUN 12; Creatinine, Ser 0.71; Hemoglobin 14.3; Platelets 219; Potassium 3.6; Sodium 140   Recent Lipid Panel    Component Value Date/Time   CHOL 315 (H) 12/13/2015 1558   CHOL 331 (H) 04/14/2013 0921   TRIG 59 12/13/2015 1558    HDL 65 12/13/2015 1558   HDL 65 04/14/2013 0921   CHOLHDL 4.8 12/13/2015 1558   VLDL 12 12/13/2015 1558   LDLCALC 238 (H) 12/13/2015 1558   LDLCALC 244 (H) 04/14/2013 0921   LDLDIRECT 224.6 12/14/2011 0919    Physical Exam:    VS:  BP 140/80   Pulse 78   Ht 5\' 2"  (1.575 m)   Wt 118 lb 12.8 oz (53.9 kg)   SpO2 98%   BMI 21.73 kg/m     Wt Readings from Last 3 Encounters:  05/17/17 118 lb 12.8 oz (53.9 kg)  05/05/17 119 lb (54 kg)  05/04/17 121 lb 9.6 oz (55.2 kg)     Physical Exam  Constitutional: She is oriented to person, place, and time. She appears well-developed and well-nourished. No distress.  HENT:  Head: Normocephalic and atraumatic.  Neck: Normal range of motion. Neck supple. No JVD present.  Cardiovascular: Normal rate, regular rhythm and normal heart sounds.  Exam reveals no gallop and no friction rub.   No murmur heard. Pulmonary/Chest: Effort normal and breath sounds normal. No respiratory distress.  Abdominal: Soft. Bowel sounds are normal. She exhibits no distension. There is no tenderness.  Musculoskeletal: Normal range of motion. She exhibits no edema or deformity.  Neurological: She is alert and oriented to person, place, and time.  Skin: Skin is warm and dry.  Psychiatric: She has a normal mood and affect. Her behavior is normal. Thought content normal.  Vitals reviewed.    ASSESSMENT:    1. SOB (shortness of breath)   2. PVC's (premature ventricular contractions)   3. Essential hypertension   4. History of stroke   5. Hyperlipidemia, unspecified hyperlipidemia type    PLAN:    In order of problems listed above:  1. Shortness of breath: Patient has been having complaints of shortness of breath for a couple of months. No chest pain. Echocardiogram showed normal LV function without any regional wall motion abnormalities and grade 1 diastolic dysfunction. Stress test showed partially reversible medium-sized, mild basal to mid inferior lateral  perfusion defect and was considered intermediate risk. EF 69% with normal wall motion. I had a long discussion with the patient and her husband about her test results and possible myocardial ischemia being the cause of her shortness of breath and recommendation for cardiac catheterization. Also discussed her high cholesterol, inability to tolerate  statins and hx of stroke adding to the increased possibility of significant coronary artery plaque buildup.  Benefits and risks were discussed in detail including but are not limited to stroke (1 in 1000), death (1 in 1000), kidney failure [usually temporary] (1 in 500), bleeding (1 in 200), allergic reaction [possibly serious] (1 in 200). After some contemplation with her husband she was just to proceed with cardiac catheterization which has been arranged for October 23 with Dr. Burt Knack. The patient has normal renal function. We will check labs including BMP, CBC, PT/INR. She is instructed to take an 81 mg aspirin procedure.  2. PVCs:  A 24-hour Holter was done and resulted on 05/10/17 showing sinus rhythm with frequent PVCs (5% burden), and rare SVT. Toprol-XL 25 mg was initiated by phone call. The patient had heart rate in the 50s and somewhat low blood pressure and was symptomatic. Her husband has cut her dose in half and we will continue that for now.  3. Essential hypertension: Blood pressure is well controlled on current regimen.  4. History of stroke: Patient is status post loop recorder. Thus far atrial fibrillation has not been identified. Last download 05/11/17. She has normal speech and has regained most of her functioning of her extremities except that she is still somewhat weak. The patient is on Plavix. She is unable to be on statin due to intolerance.  5. Hyperlipidemia: Unable to tolerate any statin and PCSK9-I was not effective in lowering cholesterol. Last LDL was 238 with HDL 65 in 12/2015.   Medication Adjustments/Labs and Tests  Ordered: Current medicines are reviewed at length with the patient today.  Concerns regarding medicines are outlined above. Labs and tests ordered and medication changes are outlined in the patient instructions below:  Patient Instructions  Medication Instructions:  1. Your physician recommends that you continue on your current medications as directed. Please refer to the Current Medication list given to you today.   Labwork: TODAY BMET, CBC, PT/INR  Testing/Procedures: Your physician has requested that you have a cardiac catheterization. Cardiac catheterization is used to diagnose and/or treat various heart conditions. Doctors may recommend this procedure for a number of different reasons. The most common reason is to evaluate chest pain. Chest pain can be a symptom of coronary artery disease (CAD), and cardiac catheterization can show whether plaque is narrowing or blocking your heart's arteries. This procedure is also used to evaluate the valves, as well as measure the blood flow and oxygen levels in different parts of your heart. For further information please visit [email or web address]. Please follow instruction sheet, as given.    Follow-Up: DR. Acie Fredrickson OR CARE TEAM TO BE SEEN IN 1 MONTH  Any Other Special Instructions Will Be Listed Below (If Applicable). If you need a refill on your cardiac medications before your next appointment, please call your pharmacy.    [place] OFFICE [address] [place] [address] [place] [address] Dept: [phone] Loc: [facility]  05/17/2017  You are scheduled for a Cardiac Catheterization on with Dr. Burt Knack 05/25/17 @ 10:30 AM  1. Please arrive at the [facility] (Main Entrance A) at [facility]: [place], [place] [address] at 8 AM (two hours before your procedure to ensure your preparation). Free valet parking service is  available.   Special note: Every effort is made to have your procedure done on time. Please understand that emergencies sometimes delay scheduled  procedures.  2. Diet: NOTHING TO EAT AFTER MIDNIGHT THE NIGHT BEFORE PROCEDURE  3. Labs: TODAY BMET, CBC, PT/INR  4. Medication instructions in preparation for your procedure:  On the morning of your procedure, take your Aspirin 81 MG AND PLAVIX THE MORNING OF CATH and any morning medicines NOT listed above.  You may use SMALL sips of water.  5. Plan for one night stay--bring personal belongings. 6. Bring a current list of your medications and current insurance cards. 7. You MUST have a responsible person to drive you home. 8. Someone MUST be with you the first 24 hours after you arrive home or your discharge will be delayed. 9. Please wear clothes that are easy to get on and off and wear slip-on shoes.  Thank you for allowing [place] to care for you!   -- [facility] Cardi[facility] services      Signed, Daune Perch, NP  05/17/2017 4:44 PM    [place]

## 2017-05-25 NOTE — Discharge Instructions (Signed)

## 2017-05-26 ENCOUNTER — Encounter (HOSPITAL_COMMUNITY): Payer: Self-pay | Admitting: Cardiovascular Disease

## 2017-05-27 DIAGNOSIS — F419 Anxiety disorder, unspecified: Secondary | ICD-10-CM | POA: Diagnosis not present

## 2017-05-27 DIAGNOSIS — R0989 Other specified symptoms and signs involving the circulatory and respiratory systems: Secondary | ICD-10-CM | POA: Diagnosis not present

## 2017-05-27 DIAGNOSIS — I10 Essential (primary) hypertension: Secondary | ICD-10-CM | POA: Diagnosis not present

## 2017-05-31 ENCOUNTER — Ambulatory Visit: Payer: Self-pay | Admitting: Physician Assistant

## 2017-06-04 ENCOUNTER — Other Ambulatory Visit: Payer: Self-pay | Admitting: Internal Medicine

## 2017-06-09 ENCOUNTER — Other Ambulatory Visit: Payer: Self-pay | Admitting: Internal Medicine

## 2017-06-09 ENCOUNTER — Ambulatory Visit: Payer: PPO | Admitting: Physician Assistant

## 2017-06-09 ENCOUNTER — Ambulatory Visit (INDEPENDENT_AMBULATORY_CARE_PROVIDER_SITE_OTHER): Payer: PPO | Admitting: *Deleted

## 2017-06-09 ENCOUNTER — Encounter: Payer: Self-pay | Admitting: Physician Assistant

## 2017-06-09 ENCOUNTER — Telehealth: Payer: Self-pay | Admitting: *Deleted

## 2017-06-09 VITALS — BP 190/64 | HR 83 | Ht 62.0 in | Wt 120.4 lb

## 2017-06-09 DIAGNOSIS — I2510 Atherosclerotic heart disease of native coronary artery without angina pectoris: Secondary | ICD-10-CM

## 2017-06-09 DIAGNOSIS — I10 Essential (primary) hypertension: Secondary | ICD-10-CM | POA: Diagnosis not present

## 2017-06-09 DIAGNOSIS — I63231 Cerebral infarction due to unspecified occlusion or stenosis of right carotid arteries: Secondary | ICD-10-CM | POA: Diagnosis not present

## 2017-06-09 DIAGNOSIS — I493 Ventricular premature depolarization: Secondary | ICD-10-CM

## 2017-06-09 HISTORY — DX: Atherosclerotic heart disease of native coronary artery without angina pectoris: I25.10

## 2017-06-09 NOTE — Telephone Encounter (Signed)
[facility]OV[facility] (DPR) requesting manual Carelink transmission for review.  Received alert for 4 "AF" episodes, but no ECGs were transmitted.  [facility] phone number for questions/concerns.

## 2017-06-09 NOTE — Progress Notes (Signed)
Cardiology Office Note:    Date:  06/09/2017   ID:  Stacey Ramirez, DOB [birth year], MRN [id number]  PCP:  Deland Pretty, MD  Cardiologist:  Dr. Liam Rogers    Referring MD: Deland Pretty, MD   Chief Complaint  Patient presents with  . Follow-up    s/p cardiac cath; hypertension     History of Present Illness:    Stacey Ramirez is a 76 y.o. female with a hx of palpitations, hypertension, hyperlipidemia, prior stroke.  Stacey Ramirez was admitted in May 2017 with a cryptogenic stroke and underwent implantation of an ILR.  Stacey Ramirez was seen for evaluation of shortness of breath in September 2018.  Stacey Ramirez had several PVCs noted on ECG.  An echocardiogram demonstrated normal LV function with mild diastolic dysfunction.  Holter monitor demonstrated sinus rhythm with frequent PVCs, occasional NSVT and rare episodes of SVT.  Total PVC burden was 5%.  Nuclear stress test was abnormal with inferolateral perfusion defect.  Stacey Ramirez was set up for cardiac catheterization 05/25/17 which demonstrated mild nonobstructive disease in the LAD and RCA and moderate nonobstructive disease in the LCx.  Medical therapy was recommended.  Stacey Ramirez returns for follow up post catheterization.  Stacey Ramirez still notes shortness of breath from time to time.  Stacey Ramirez denies headaches, blurry vision. Stacey Ramirez has residual L sided weakness since her stroke.  Stacey Ramirez denies chest pain, syncope, paroxysmal nocturnal dyspnea.  Stacey Ramirez has leg swelling that has been worse in the past with Amlodipine.  Of note, her PCP recently stopped her Avapro, Amlodipine, Toprol b/c her blood pressure was low.  Her husband gets multiple readings at home but has mainly recorded optimal BPs since stopping her medications.    Prior CV studies:   The following studies were reviewed today:  Cardiac catheterization 05/25/17 LAD proximal 25 LCx proximal 50 RCA proximal 25  Holter monitor 05/04/17 Normal sinus rhythm, frequent PVCs with total burden 5%, occasional NSVT, rare episodes of  SVT  Nuclear stress test 05/05/17 EF 69, inferolateral perfusion defect-possible small infarct with peri-infarct ischemia; intermediate risk  Echocardiogram 05/05/17 Vigorous LVEF, EF 65-70, normal wall motion, grade 1 diastolic dysfunction, GLS -21.1, mild RAE  TEE 12/17/15 Mild focal basal septal hypertrophy, EF 60-65, normal wall motion, trivial AI, mild MR, no LAA clot  Echo 12/13/15 Mild LVH, EF 60-65, normal wall motion, grade 1 diastolic dysfunction, normal RVSF  Carotid US 12/13/15 Bilateral ICA 1-39  Echo 9/14 Mild LVH, EF 27-06, grade 1 diastolic dysfunction  Past Medical History:  Diagnosis Date  . Adjustment reaction with anxiety 12/20/2015  . Aortic stenosis   . CAD (coronary artery disease) 06/09/2017   Nuc study 10/18: EF 69, inferolateral perfusion defect-possible small infarct with peri-infarct ischemia; intermediate risk // LHC 10/18: pLAD 25, pLCx 50, pRCA 25 >> med Rx  . Carotid stenosis, asymptomatic, bilateral 02/27/2016   Carotid US 5/17: Bilateral ICA 1-39  . Cerebrovascular accident (stroke) ([place])    a. 12/2015 - cryptogenic.  S/P MDT Linq.  . Difficulty walking   . Essential hypertension   . Gait disorder 04/07/2016  . Heart palpitations 09/06/2012  . History of CVA with residual deficit 12/14/2015  . History of dizziness   . History of echocardiogram    Echo 10/18: Vigorous LVF, EF 65-70, normal wall motion, grade 1 diastolic dysfunction, GLS -21.1%, mild RAE  . History of nuclear stress test    Nuclear stress test 10/18: EF 69, inf-lat defect c/w poss infarct with peri-infarct ischemia; Intermediate Risk  .  Hyperlipidemia   . Hypothyroidism   . Left hemiparesis ([place])   . LVH (left ventricular hypertrophy)   . Mitral regurgitation    a. 12/2015 Echo: EF 60-65%, mild focal basal hypertrophy. No rwma, triv AI, mild MR, mildly dil LA w/o thrombus. No RA thrombus. No PFO.  Marland Kitchen Palpitations   . Prediabetes   . Stroke-like symptom 12/13/2015  . SVT  (supraventricular tachycardia) ([place]) 12/14/2015  . Vascular parkinsonism ([place]) 06/29/2016  . Vitamin D deficiency     Past Surgical History:  Procedure Laterality Date  . CARDIOVASCULAR STRESS TEST  2002   NORMAL  . LOOP RECORDER IMPLANT    . TRANSTHORACIC ECHOCARDIOGRAM  2008   SHOWED LEFT VENTRICULAR HYPERTROPHY AND MILD AORTIC STENOSIS    Current Medications: Current Meds  Medication Sig  . ALPRAZolam (XANAX) 0.25 MG tablet Take 0.25 mg by mouth at bedtime as needed for anxiety or sleep.  . carboxymethylcellulose (REFRESH PLUS) 0.5 % SOLN Place 1 drop into both eyes 3 (three) times daily as needed (DRY EYE).   . Cholecalciferol (VITAMIN D) 2000 units tablet Take 2,000 Units by mouth daily.  . clopidogrel (PLAVIX) 75 MG tablet Take 1 tablet (75 mg total) by mouth daily.  Marland Kitchen levothyroxine (SYNTHROID, LEVOTHROID) 25 MCG tablet Take 25 mcg by mouth daily before breakfast.   . loratadine (CLARITIN) 10 MG tablet Take 10 mg by mouth daily as needed for allergies.   Marland Kitchen LOTEMAX 0.5 % ophthalmic suspension Place 1 drop into both eyes 4 (four) times daily as needed. For dry itchy eyes  . Multiple Vitamin (MULTIVITAMIN) tablet Take 1 tablet by mouth daily.  . sertraline (ZOLOFT) 50 MG tablet Take 50 mg by mouth daily.      Allergies:   Sulfa drugs cross reactors; Zocor [simvastatin]; Crestor [rosuvastatin calcium]; Statins; and Micardis [telmisartan]   Social History   Tobacco Use  . Smoking status: Never Smoker  . Smokeless tobacco: Never Used  Substance Use Topics  . Alcohol use: Yes    Alcohol/week: 0.6 oz    Types: 1 Glasses of wine per week    Comment: socially  . Drug use: No     Family Hx: The patient's family history includes Alzheimer's disease in her mother; Heart attack in her mother; Stroke in her paternal uncle.  ROS:   Please see the history of present illness.    ROS All other systems reviewed and are negative.   EKGs/Labs/Other Test Reviewed:    EKG:  EKG is  not ordered today.    Recent Labs: 04/25/2017: ALT 18 05/17/2017: BUN 13; Creatinine, Ser 0.72; Hemoglobin 15.2; Platelets 293; Potassium 4.3; Sodium 143   Recent Lipid Panel Lab Results  Component Value Date/Time   CHOL 315 (H) 12/13/2015 03:58 PM   CHOL 331 (H) 04/14/2013 09:21 AM   TRIG 59 12/13/2015 03:58 PM   HDL 65 12/13/2015 03:58 PM   HDL 65 04/14/2013 09:21 AM   CHOLHDL 4.8 12/13/2015 03:58 PM   LDLCALC 238 (H) 12/13/2015 03:58 PM   LDLCALC 244 (H) 04/14/2013 09:21 AM   LDLDIRECT 224.6 12/14/2011 09:19 AM    Physical Exam:    VS:  BP (!) 190/64   Pulse 83   Ht 5\' 2"  (1.575 m)   Wt 120 lb 6.4 oz (54.6 kg)   SpO2 96%   BMI 22.02 kg/m     Wt Readings from Last 3 Encounters:  06/09/17 120 lb 6.4 oz (54.6 kg)  05/25/17 115 lb (52.2 kg)  05/17/17 118 lb 12.8 oz (53.9 kg)     Physical Exam  Constitutional: Stacey Ramirez is oriented to person, place, and time. Stacey Ramirez appears well-developed and well-nourished. No distress.  HENT:  Head: Normocephalic and atraumatic.  Cardiovascular: Normal rate and regular rhythm.  No murmur heard. Pulmonary/Chest: Effort normal. Stacey Ramirez has no rales.  Abdominal: Soft.  Musculoskeletal: Stacey Ramirez exhibits edema (trace-1+ ankle edema bilat). Stacey Ramirez exhibits no deformity (R wrist without hematoma).  Neurological: Stacey Ramirez is alert and oriented to person, place, and time.  Skin: Skin is warm and dry.    ASSESSMENT:    1. Essential hypertension   2. Coronary artery disease involving native coronary artery of native heart without angina pectoris   3. PVC's (premature ventricular contractions)    PLAN:    In order of problems listed above:  1.  Essential hypertension Blood pressure is difficult to obtain.  We could not hear her blood pressure with a stethoscope in either arm.  I was able to palpate her systolic pressure at 378.  I was able to obtain her systolic pressure with Doppler at 220.  In any event, her blood pressure is uncontrolled.  I have recommended  that Stacey Ramirez resume Avapro 300 mg daily.  We will bring her back in close follow-up in the next week with the hypertension clinic or 1 of our PA/NP's for follow-up on blood pressure.  I have suggested that Stacey Ramirez try to obtain a wrist cuff for home monitoring.    2.  Coronary artery disease involving native coronary artery of native heart without angina pectoris Mild to moderate nonobstructive disease by recent cardiac catheterization.  Continue Plavix.  Stacey Ramirez is intolerant of statins.  3.  The PVC's (premature ventricular contractions) Holter monitor also demonstrated short episodes of SVT.  If her blood pressure remains above target at follow-up, consider resuming beta-blocker therapy.   Dispo:  Return in about 1 week (around 06/16/2017) for Close Follow Up w/ HTN clinic or PA/NP.   Medication Adjustments/Labs and Tests Ordered: Current medicines are reviewed at length with the patient today.  Concerns regarding medicines are outlined above.  Tests Ordered: No orders of the defined types were placed in this encounter.  Medication Changes: No orders of the defined types were placed in this encounter.   Signed, Richardson Dopp, PA-C  06/09/2017 2:50 PM    [facility] [address], [place], [place]  [address] Phone: [phone]; Fax: [phone]

## 2017-06-09 NOTE — Patient Instructions (Signed)
Your physician has recommended you make the following change in your medication: RESTART [place]   Your physician recommends that you schedule a follow-up appointment in:  3 - 4 WEEKS WITH DR NAHSER   1 WEEK IN HTN CLINIC IF NOTHING AVAILABLE  WITH PA ON  SCOTT'S TEAM

## 2017-06-10 LAB — CUP PACEART REMOTE DEVICE CHECK
Implantable Pulse Generator Implant Date: 20170516
MDC IDC SESS DTM: 20181108031018

## 2017-06-10 NOTE — Progress Notes (Signed)
Carelink Summary Report / Loop Recorder 

## 2017-06-15 ENCOUNTER — Ambulatory Visit: Payer: Self-pay | Admitting: Physician Assistant

## 2017-06-17 DIAGNOSIS — E785 Hyperlipidemia, unspecified: Secondary | ICD-10-CM | POA: Diagnosis not present

## 2017-06-17 DIAGNOSIS — Z0000 Encounter for general adult medical examination without abnormal findings: Secondary | ICD-10-CM | POA: Diagnosis not present

## 2017-06-17 DIAGNOSIS — I10 Essential (primary) hypertension: Secondary | ICD-10-CM | POA: Diagnosis not present

## 2017-06-17 DIAGNOSIS — M810 Age-related osteoporosis without current pathological fracture: Secondary | ICD-10-CM | POA: Diagnosis not present

## 2017-06-17 DIAGNOSIS — Z23 Encounter for immunization: Secondary | ICD-10-CM | POA: Diagnosis not present

## 2017-06-17 DIAGNOSIS — E559 Vitamin D deficiency, unspecified: Secondary | ICD-10-CM | POA: Diagnosis not present

## 2017-06-17 NOTE — Telephone Encounter (Signed)
LMOVM requesting manual transmission for review. [facility] phone number to call back for questions or concerns.

## 2017-06-17 NOTE — Telephone Encounter (Signed)
Mr. And Mrs. Yeske calling regarding message- assisted sending manual transmission with home monitor. We will review the received transmission and call back with any new information. They verbalize understanding.

## 2017-06-17 NOTE — Telephone Encounter (Signed)
Manual transmission received. 4 "AF" episodes appear SB with PACs/PVCs. Will place in Dr. Jackalyn Lombard folder.

## 2017-06-23 DIAGNOSIS — I2510 Atherosclerotic heart disease of native coronary artery without angina pectoris: Secondary | ICD-10-CM | POA: Diagnosis not present

## 2017-06-23 DIAGNOSIS — Z23 Encounter for immunization: Secondary | ICD-10-CM | POA: Diagnosis not present

## 2017-06-23 DIAGNOSIS — I6930 Unspecified sequelae of cerebral infarction: Secondary | ICD-10-CM | POA: Diagnosis not present

## 2017-06-23 DIAGNOSIS — R0609 Other forms of dyspnea: Secondary | ICD-10-CM | POA: Diagnosis not present

## 2017-06-23 DIAGNOSIS — I340 Nonrheumatic mitral (valve) insufficiency: Secondary | ICD-10-CM | POA: Diagnosis not present

## 2017-06-23 DIAGNOSIS — E7800 Pure hypercholesterolemia, unspecified: Secondary | ICD-10-CM | POA: Diagnosis not present

## 2017-06-23 DIAGNOSIS — M810 Age-related osteoporosis without current pathological fracture: Secondary | ICD-10-CM | POA: Diagnosis not present

## 2017-06-23 DIAGNOSIS — Z0001 Encounter for general adult medical examination with abnormal findings: Secondary | ICD-10-CM | POA: Diagnosis not present

## 2017-06-23 DIAGNOSIS — I6509 Occlusion and stenosis of unspecified vertebral artery: Secondary | ICD-10-CM | POA: Diagnosis not present

## 2017-06-23 DIAGNOSIS — I499 Cardiac arrhythmia, unspecified: Secondary | ICD-10-CM | POA: Diagnosis not present

## 2017-06-23 DIAGNOSIS — E039 Hypothyroidism, unspecified: Secondary | ICD-10-CM | POA: Diagnosis not present

## 2017-06-23 DIAGNOSIS — E040 Nontoxic diffuse goiter: Secondary | ICD-10-CM | POA: Diagnosis not present

## 2017-06-25 IMAGING — CR DG CHEST 2V
2 series · 2 of 2 positions shown · non-contrast
Comparison: None.

CLINICAL DATA: Dizziness and weakness.  Hypertension.

EXAM:
CHEST  2 VIEW

[chest pa]
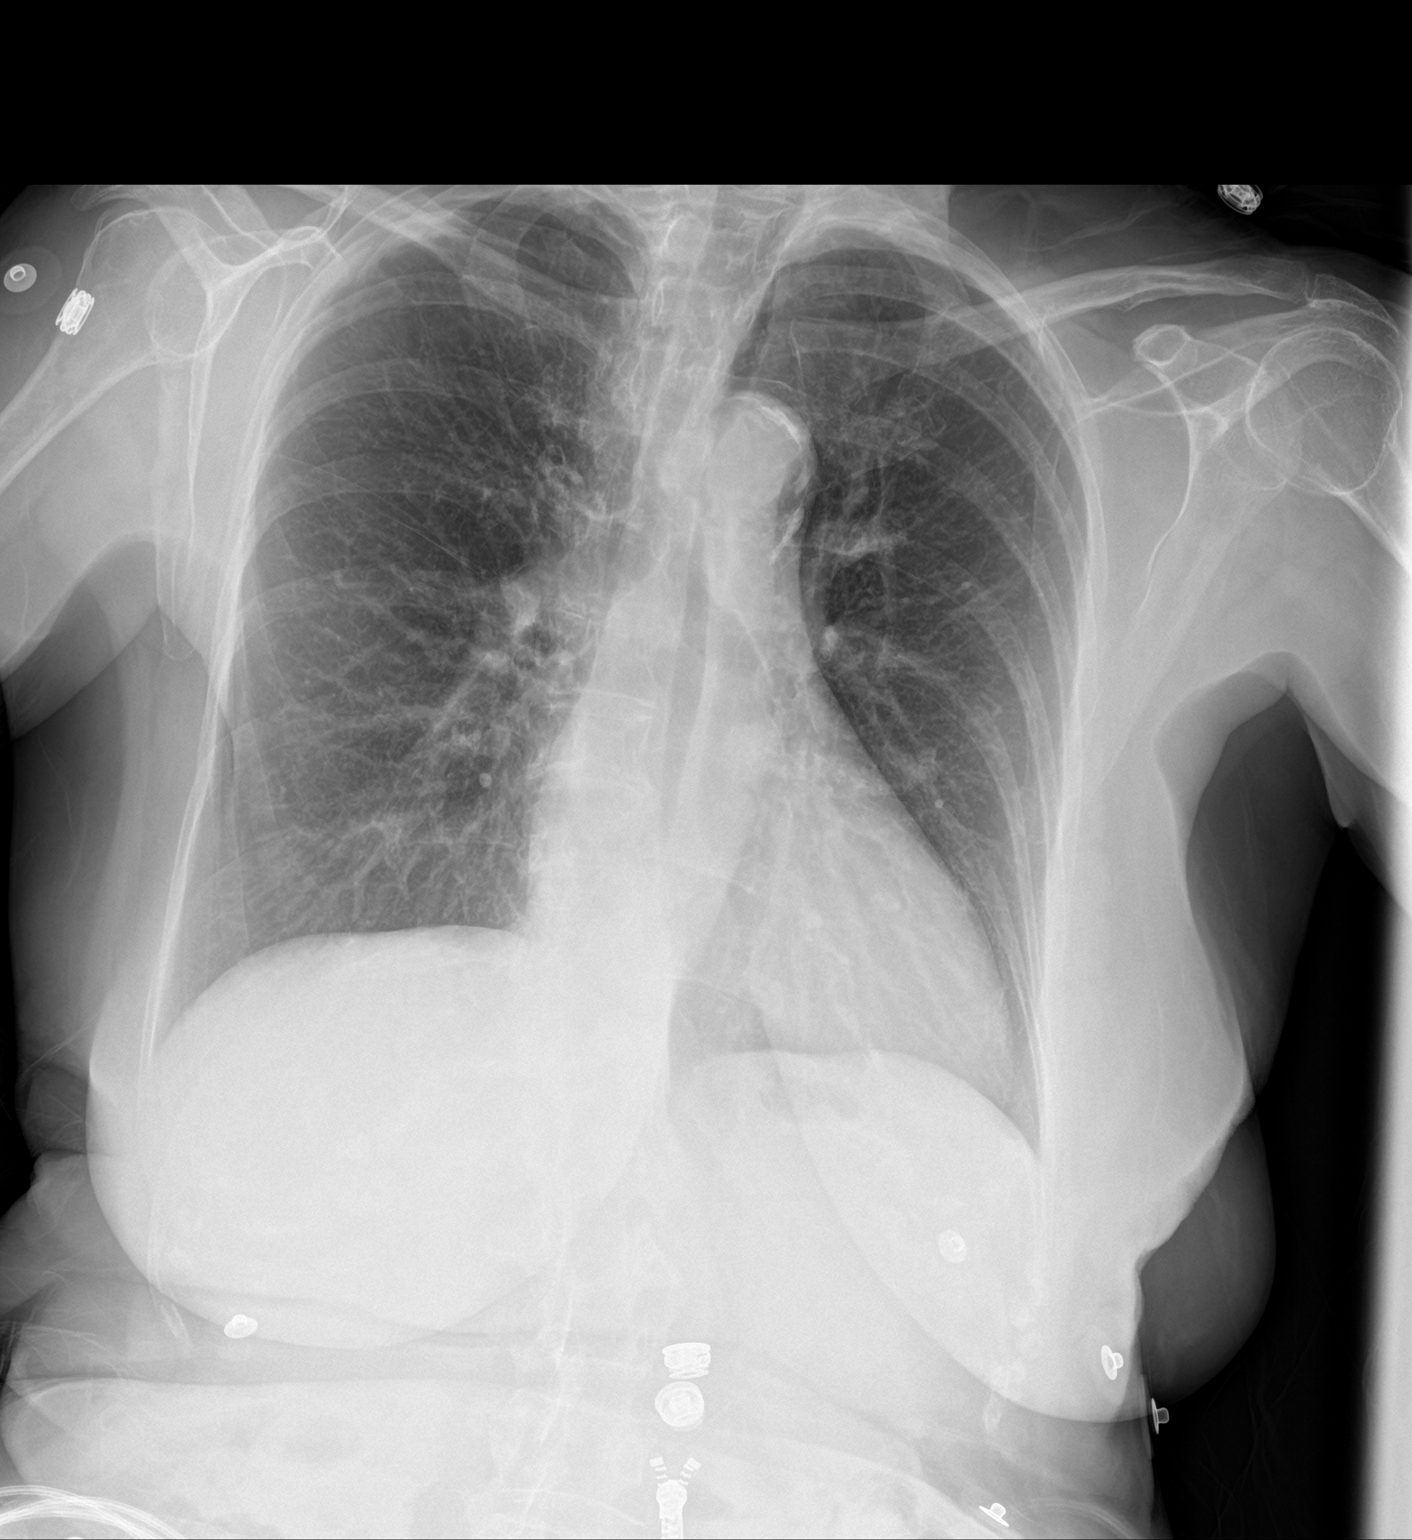

[chest lat]
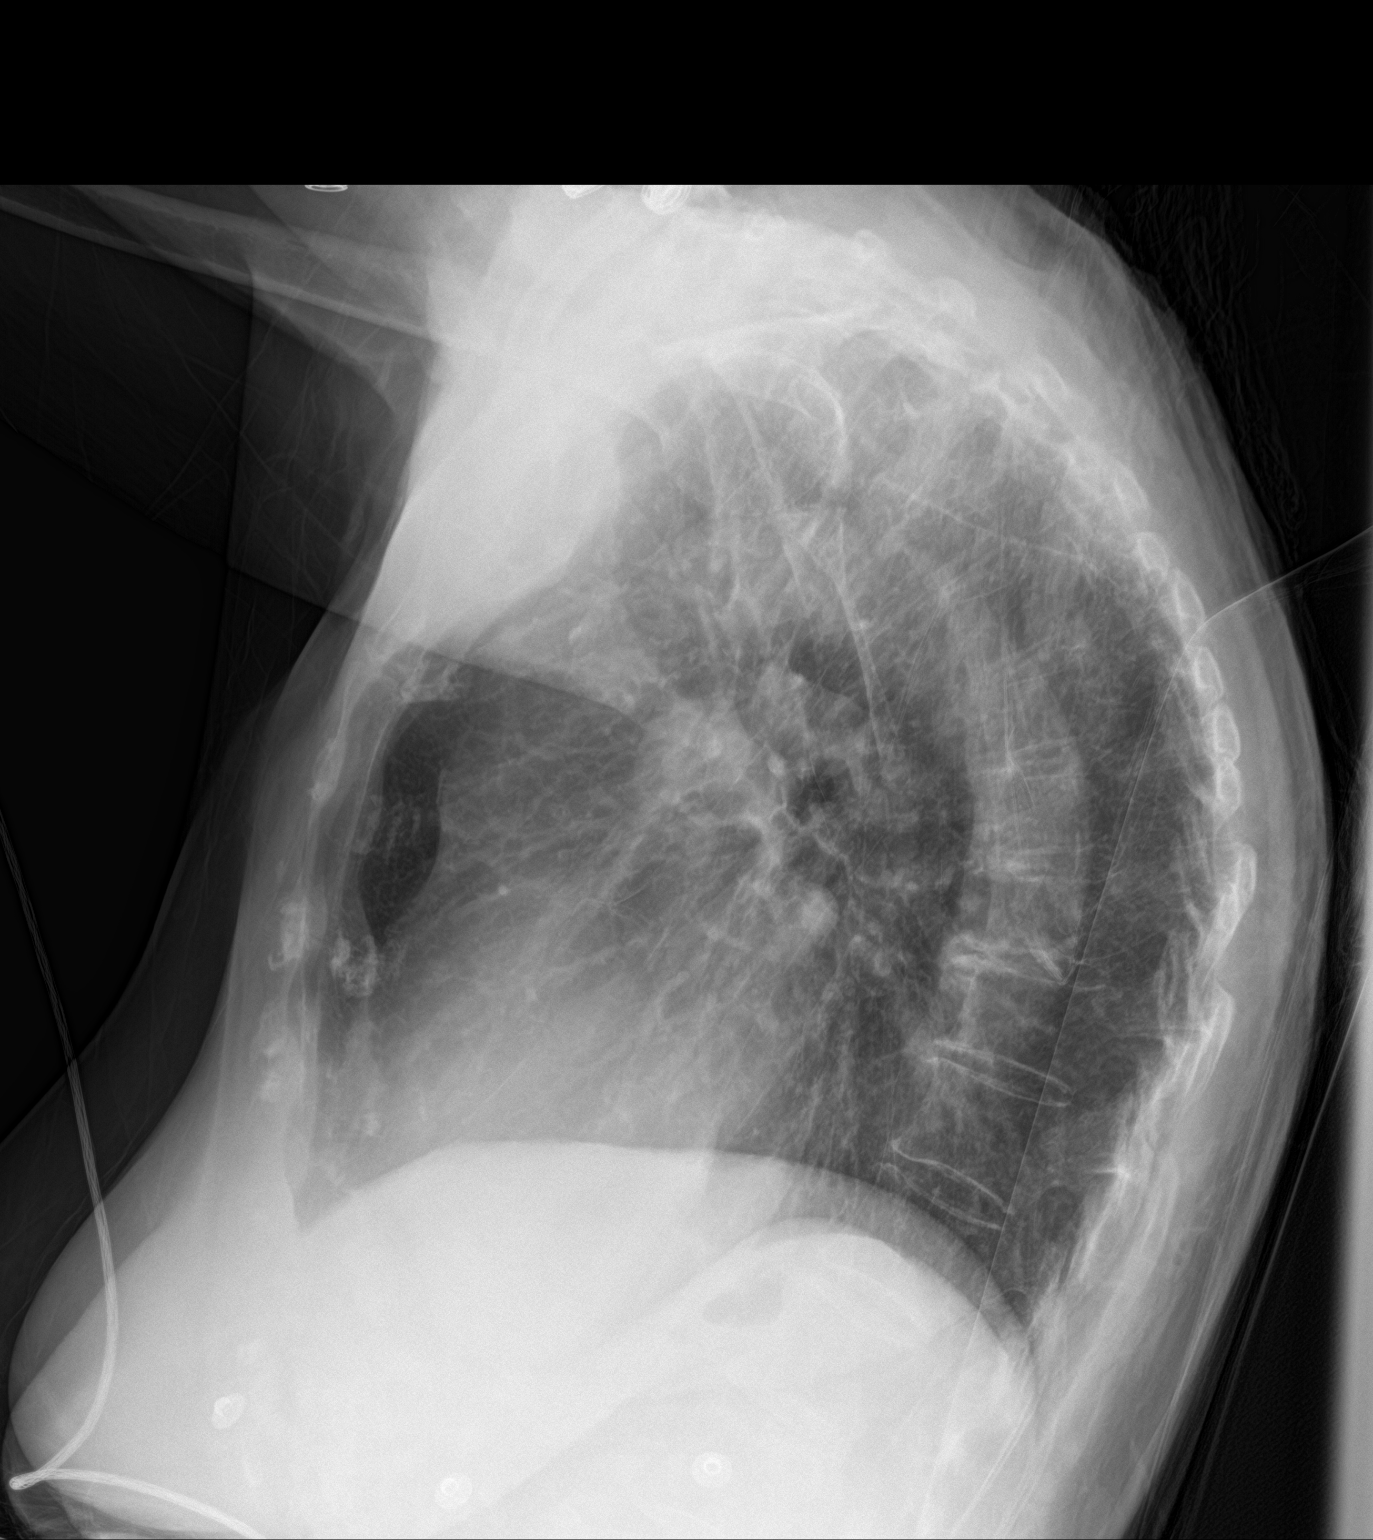

[2 of 2 positions shown; findings below may reference images not displayed]

FINDINGS: There is no edema or consolidation. Heart size and pulmonary
vascularity are normal. There is atherosclerotic calcification in
the aorta. No adenopathy. No bone lesions.
IMPRESSION: No edema or consolidation.

## 2017-06-25 IMAGING — MR MR MRA HEAD W/O CM
9 of 12 series · 30 of 48 positions shown · non-contrast
Comparison: Head CT without contrast 7388 hours today. Head and
neck MRA a 04/19/2013.

CLINICAL DATA: 75-year-old female with slurred speech, difficulty
walking and facial droop symptom onset today. Initial encounter.

EXAM:
MRI HEAD WITHOUT CONTRAST
MRA HEAD WITHOUT CONTRAST
TECHNIQUE: Multiplanar, multiecho pulse sequences of the brain and surrounding
structures were obtained without intravenous contrast. Angiographic
images of the head were obtained using MRA technique without
contrast.

[Series 3: DWI · axial · 3.0mm · 1.09mm/px · z∈[-49,+89]mm · 7 of 94 slices shown (1 of 4)]
[im 1/94]
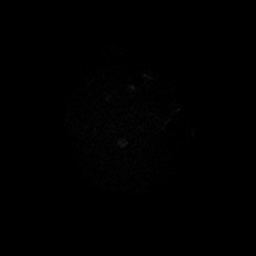
[im 16/94]
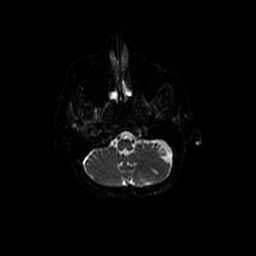
[im 32/94]
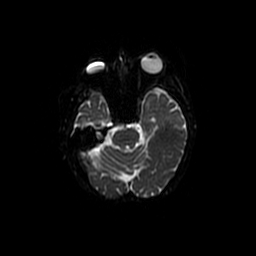
[im 47/94]
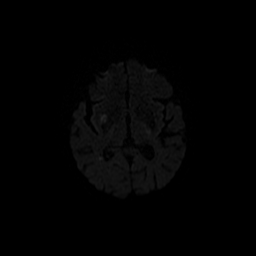
[im 63/94]
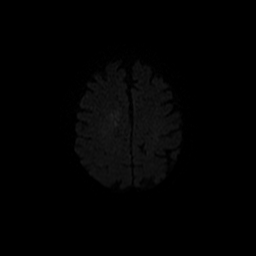
[im 78/94]
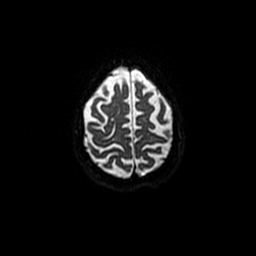
[im 94/94]
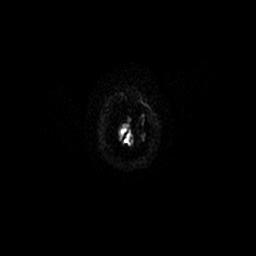

[Series 4: T2 · axial · 5.0mm · 0.43mm/px · 1 of 24 slices shown (1 of 2)]
[im 1/24]
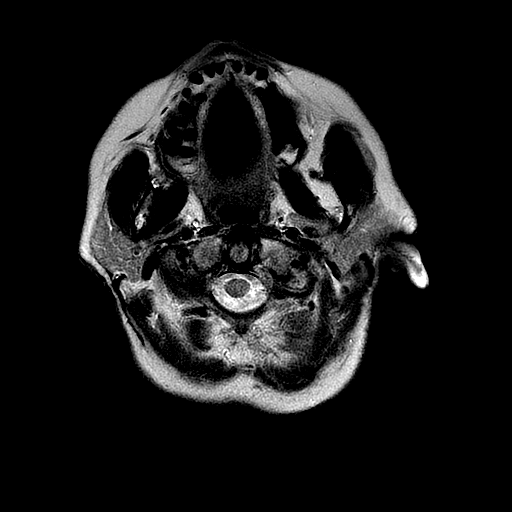

[Series 5: FLAIR · axial · 5.0mm · 0.43mm/px · z∈[-49,+88]mm · 2 of 24 slices shown]
[im 1/24]
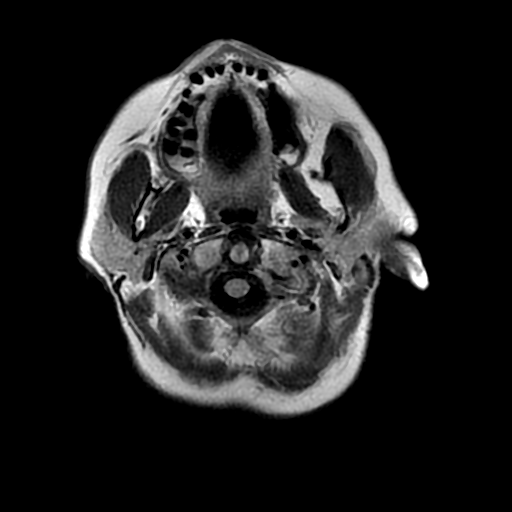
[im 24/24]
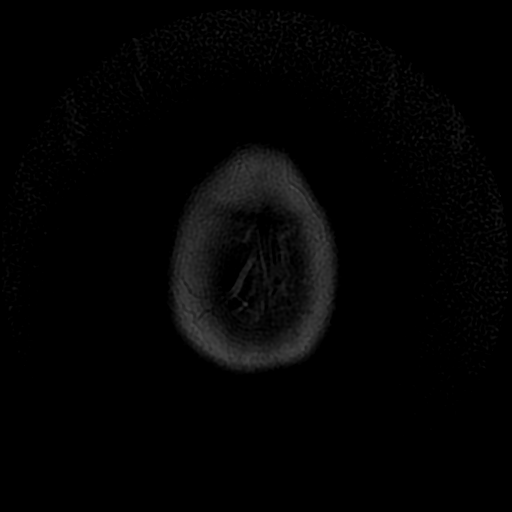

[Series 6: T1 · sagittal · 5.0mm · 0.47mm/px · 2 of 23 slices shown]
[im 1/23]
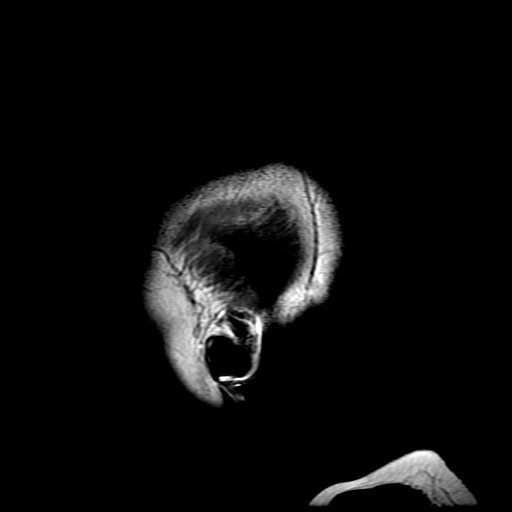
[im 23/23]
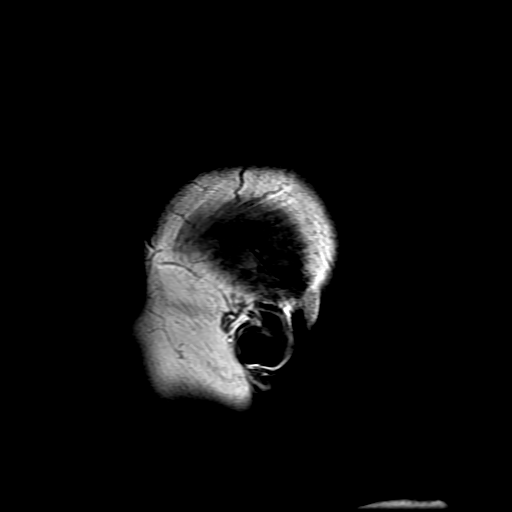

[Series 7: (id) mt fs · axial · 1.4mm · 0.43mm/px · z∈[-53,-16]mm · 4 of 136 slices shown]
[im 1/136]
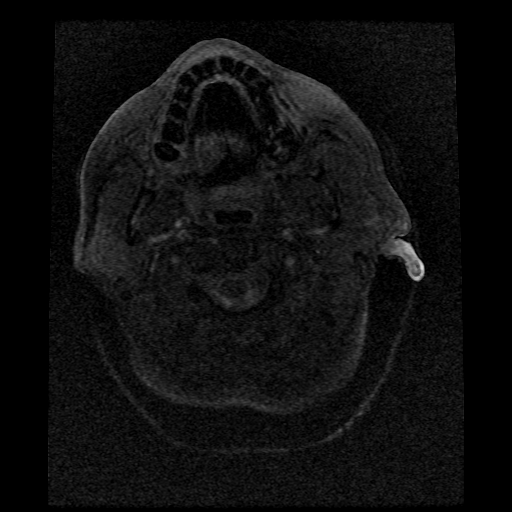
[im 28/136]
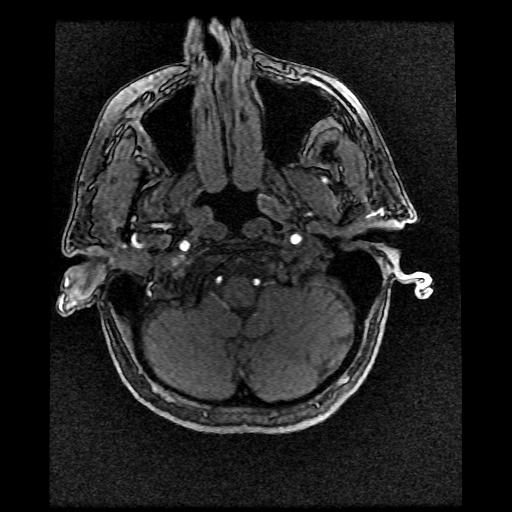
[im 41/136]
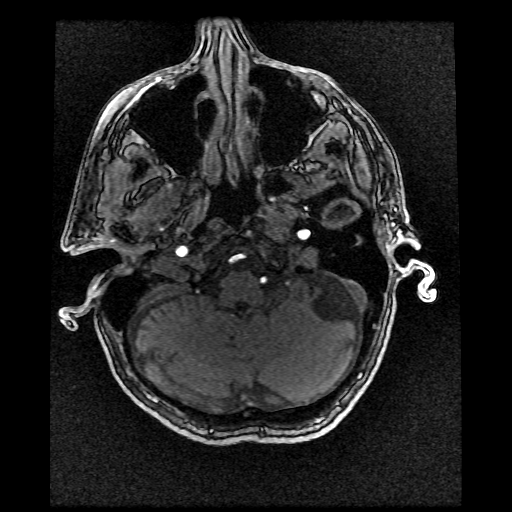
[im 55/136]
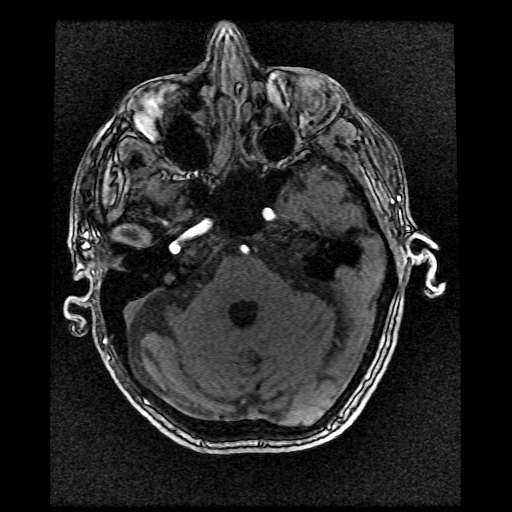

[Series 8: DWI · coronal · 5.0mm · 1.09mm/px · 5 of 66 slices shown (2 of 4)]
[im 1/66]
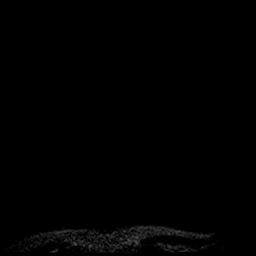
[im 17/66]
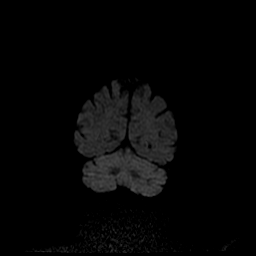
[im 33/66]
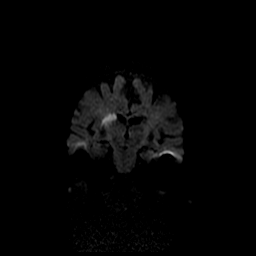
[im 49/66]
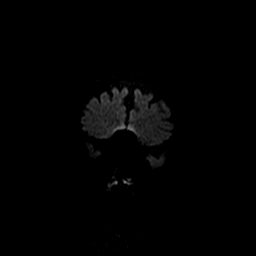
[im 66/66]
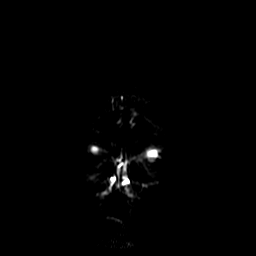

[Series 11: T2 · coronal · 5.0mm · 0.43mm/px · 2 of 27 slices shown (2 of 2)]
[im 1/27]
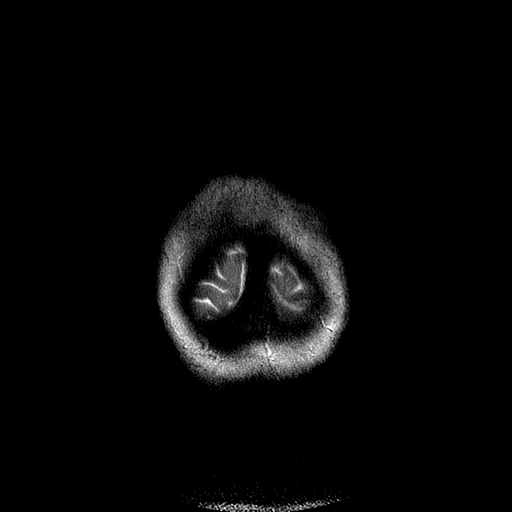
[im 27/27]
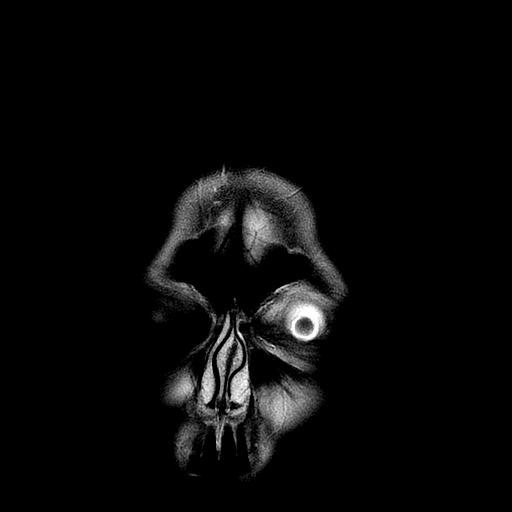

[Series 300: DWI · axial · 3.0mm · 1.09mm/px · z∈[-49,+89]mm · 4 of 47 slices shown (3 of 4)]
[im 1/47]
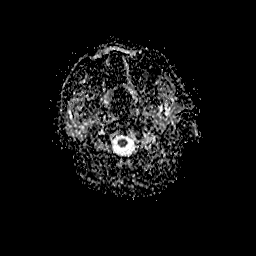
[im 16/47]
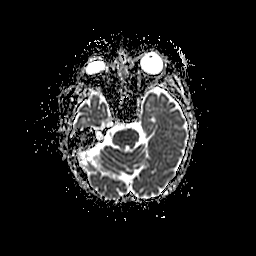
[im 31/47]
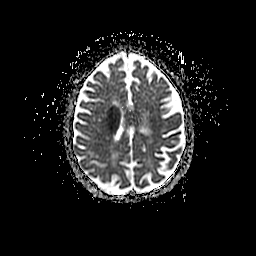
[im 47/47]
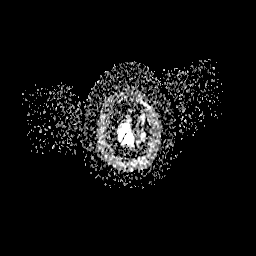

[Series 800: DWI · coronal · 5.0mm · 1.09mm/px · 3 of 33 slices shown (4 of 4)]
[im 1/33]
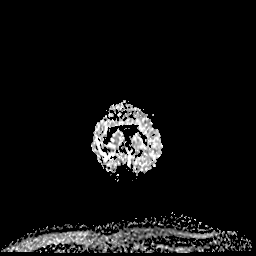
[im 17/33]
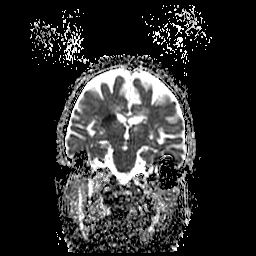
[im 33/33]
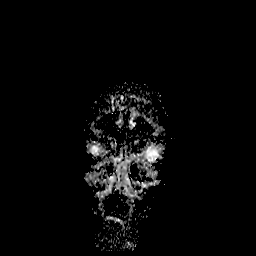

[30 of 48 positions shown; findings below may reference images not displayed]

FINDINGS: MRI HEAD FINDINGS

Major intracranial vascular flow voids are preserved. Confluent
cm area of restricted diffusion in the right basal ganglia. Minimal
to mild associated T2 and FLAIR hyperintensity. No associated
hemorrhage. Mild regional mass effect, no significant intracranial
mass effect.

There is a superimposed punctate area of periventricular white
matter restricted diffusion at the atrium on the right (series 8,
image 11). No contralateral or posterior fossa restricted diffusion.

Superimposed small chronic lacunar infarcts in both cerebellar
hemispheres an the thalamus. Increased perivascular spaces
throughout the basal ganglia. Patchy mostly posterior hemisphere
nonspecific cerebral white matter T2 and FLAIR hyperintensity. Some
of these white matter changes ppm most resemble chronic lacunar
infarcts. No supratentorial cortical encephalomalacia identified.
There are scattered chronic micro hemorrhages in the temporal lobes
and cerebellum.

No midline shift, evidence of mass lesion, ventriculomegaly,
extra-axial collection or acute intracranial hemorrhage.
Cervicomedullary junction and pituitary are within normal limits.
Negative visualized cervical spine. Mildly prominent internal
auditory canals with otherwise negative visualized internal auditory
structures. Mastoids are clear. Paranasal sinuses are clear.
Negative orbit and scalp soft tissues. Visualized bone marrow signal
is within normal limits.

MRA HEAD FINDINGS

Stable antegrade flow in the posterior circulation with fairly
codominant distal vertebral arteries. Both PICA origins remain
patent. Vertebrobasilar junction remains patent. Mild distal
vertebral artery irregularity without stenosis appears stable. Mid
basilar artery irregularity with up to mild stenosis has mildly
progressed. SCA and PCA origins remain patent. There is mild to
moderate stenosis in the right PCA P1 segment which is new.
Bilateral PCA branches are stable. Posterior communicating arteries
are diminutive or absent.

Stable antegrade flow in both ICA siphons and distal cervical ICAs.
Bilateral siphon atherosclerosis and irregularity. At least moderate
stenosis just distal to the right ICA anterior genu appears stable
(series 705, image 6). Normal ophthalmic artery origins. Patent
carotid termini.

The right MCA origin and M1 segment remain patent. Mild M1
irregularity has not significantly changed. Right MCA bifurcation
remains patent. Visualized right MCA branches appear stable with
mild distal M2 and M3 branch irregularity. No right MCA branch
occlusion identified.

Bilateral ACA and left MCA branches are stable with mild
irregularity.
IMPRESSION: 1. Confluent acute infarct in the right basal ganglia with no
associated hemorrhage or mass effect. Superimposed punctate
posterior right MCA periventricular white matter infarct.
2. Negative for emergent large vessel occlusion. Anterior
circulation atherosclerosis appears stable since 1855, including at
least moderate stenosis of the supraclinoid right ICA (series 705,
image 6).
3. Mild progression of posterior circulation atherosclerosis since
1855 including new mild stenosis of the mid basilar artery and
moderate stenosis of the right PCA P1 segment.
4. Underlying chronic small vessel ischemia, moderate for age.

## 2017-06-28 DIAGNOSIS — Z1231 Encounter for screening mammogram for malignant neoplasm of breast: Secondary | ICD-10-CM | POA: Diagnosis not present

## 2017-06-29 DIAGNOSIS — E559 Vitamin D deficiency, unspecified: Secondary | ICD-10-CM | POA: Diagnosis not present

## 2017-06-29 DIAGNOSIS — N390 Urinary tract infection, site not specified: Secondary | ICD-10-CM | POA: Diagnosis not present

## 2017-07-05 ENCOUNTER — Other Ambulatory Visit: Payer: Self-pay | Admitting: Internal Medicine

## 2017-07-07 ENCOUNTER — Ambulatory Visit: Payer: Self-pay | Admitting: Cardiovascular Disease

## 2017-07-09 ENCOUNTER — Ambulatory Visit (INDEPENDENT_AMBULATORY_CARE_PROVIDER_SITE_OTHER): Payer: PPO | Admitting: *Deleted

## 2017-07-09 DIAGNOSIS — I63231 Cerebral infarction due to unspecified occlusion or stenosis of right carotid arteries: Secondary | ICD-10-CM | POA: Diagnosis not present

## 2017-07-12 NOTE — Progress Notes (Signed)
Carelink Summary Report / Loop Recorder 

## 2017-07-19 LAB — CUP PACEART REMOTE DEVICE CHECK
Implantable Pulse Generator Implant Date: 20170516
MDC IDC SESS DTM: 20181208033723

## 2017-08-09 ENCOUNTER — Ambulatory Visit (INDEPENDENT_AMBULATORY_CARE_PROVIDER_SITE_OTHER): Payer: PPO | Admitting: *Deleted

## 2017-08-09 DIAGNOSIS — R269 Unspecified abnormalities of gait and mobility: Secondary | ICD-10-CM | POA: Diagnosis not present

## 2017-08-09 DIAGNOSIS — W19XXXA Unspecified fall, initial encounter: Secondary | ICD-10-CM | POA: Diagnosis not present

## 2017-08-09 DIAGNOSIS — S299XXA Unspecified injury of thorax, initial encounter: Secondary | ICD-10-CM | POA: Diagnosis not present

## 2017-08-09 DIAGNOSIS — R531 Weakness: Secondary | ICD-10-CM | POA: Diagnosis not present

## 2017-08-09 DIAGNOSIS — I63231 Cerebral infarction due to unspecified occlusion or stenosis of right carotid arteries: Secondary | ICD-10-CM | POA: Diagnosis not present

## 2017-08-09 DIAGNOSIS — R0781 Pleurodynia: Secondary | ICD-10-CM | POA: Diagnosis not present

## 2017-08-09 NOTE — Progress Notes (Signed)
Carelink Summary Report / Loop Recorder 

## 2017-08-20 LAB — CUP PACEART REMOTE DEVICE CHECK
Date Time Interrogation Session: 20190107051535
Implantable Pulse Generator Implant Date: 20170516

## 2017-08-22 ENCOUNTER — Other Ambulatory Visit: Payer: Self-pay | Admitting: Cardiovascular Disease

## 2017-08-24 ENCOUNTER — Other Ambulatory Visit: Payer: Self-pay

## 2017-08-24 ENCOUNTER — Ambulatory Visit: Payer: PPO | Attending: Internal Medicine | Admitting: Physical Therapy

## 2017-08-24 ENCOUNTER — Encounter: Payer: Self-pay | Admitting: Physical Therapy

## 2017-08-24 DIAGNOSIS — M6281 Muscle weakness (generalized): Secondary | ICD-10-CM | POA: Diagnosis not present

## 2017-08-24 DIAGNOSIS — R2689 Other abnormalities of gait and mobility: Secondary | ICD-10-CM | POA: Insufficient documentation

## 2017-08-24 DIAGNOSIS — R262 Difficulty in walking, not elsewhere classified: Secondary | ICD-10-CM | POA: Diagnosis not present

## 2017-08-24 DIAGNOSIS — F419 Anxiety disorder, unspecified: Secondary | ICD-10-CM | POA: Diagnosis not present

## 2017-08-24 NOTE — Therapy (Signed)
[address] [place], [place], [address] Phone: [phone]   Fax:  [phone]  Physical Therapy Evaluation  Patient Details  Name: Stacey Ramirez MRN: [id number] Date of Birth: [birth year] Referring Provider: Deland Pretty   Encounter Date: 08/24/2017  PT End of Session - 08/24/17 1156    Visit Number  1    Date for PT Re-Evaluation  10/22/17    PT Start Time  1104    PT Stop Time  1154    PT Time Calculation (min)  50 min    Activity Tolerance  Patient tolerated treatment well    Behavior During Therapy  [facility] for tasks assessed/performed;Anxious anxious talker       Past Medical History:  Diagnosis Date  . Adjustment reaction with anxiety 12/20/2015  . Aortic stenosis   . CAD (coronary artery disease) 06/09/2017   Nuc study 10/18: EF 69, inferolateral perfusion defect-possible small infarct with peri-infarct ischemia; intermediate risk // LHC 10/18: pLAD 25, pLCx 50, pRCA 25 >> med Rx  . Carotid stenosis, asymptomatic, bilateral 02/27/2016   Carotid US 5/17: Bilateral ICA 1-39  . Cerebrovascular accident (stroke) ([place])    a. 12/2015 - cryptogenic.  S/P MDT Linq.  . Difficulty walking   . Essential hypertension   . Gait disorder 04/07/2016  . Heart palpitations 09/06/2012  . History of CVA with residual deficit 12/14/2015  . History of dizziness   . History of echocardiogram    Echo 10/18: Vigorous LVF, EF 65-70, normal wall motion, grade 1 diastolic dysfunction, GLS -21.1%, mild RAE  . History of nuclear stress test    Nuclear stress test 10/18: EF 69, inf-lat defect c/w poss infarct with peri-infarct ischemia; Intermediate Risk  . Hyperlipidemia   . Hypothyroidism   . Left hemiparesis ([place])   . LVH (left ventricular hypertrophy)   . Mitral regurgitation    a. 12/2015 Echo: EF 60-65%, mild focal basal hypertrophy. No rwma, triv AI, mild MR, mildly dil LA w/o thrombus. No RA thrombus. No PFO.  Marland Kitchen  Palpitations   . Prediabetes   . Stroke-like symptom 12/13/2015  . SVT (supraventricular tachycardia) ([place]) 12/14/2015  . Vascular parkinsonism ([place]) 06/29/2016  . Vitamin D deficiency     Past Surgical History:  Procedure Laterality Date  . CARDIOVASCULAR STRESS TEST  2002   NORMAL  . EP IMPLANTABLE DEVICE N/A 12/17/2015   Procedure: Loop Recorder Insertion;  Surgeon: [name], MD;  Location: [facility];  Service: Cardiovascular;  Laterality: N/A;  . LEFT HEART CATH AND CORONARY ANGIOGRAPHY N/A 05/25/2017   Procedure: LEFT HEART CATH AND CORONARY ANGIOGRAPHY;  Surgeon: Sherren Mocha, MD;  Location: [facility];  Service: Cardiovascular;  Laterality: N/A;  . LOOP RECORDER IMPLANT    . TEE WITHOUT CARDIOVERSION N/A 12/17/2015   Procedure: TRANSESOPHAGEAL ECHOCARDIOGRAM (TEE);  Surgeon: Lelon Perla, MD;  Location: [facility] ENDOSCOPY;  Service: Cardiovascular;  Laterality: N/A;  Pt also needs a LOOP  . TRANSTHORACIC ECHOCARDIOGRAM  2008   SHOWED LEFT VENTRICULAR HYPERTROPHY AND MILD AORTIC STENOSIS    There were no vitals filed for this visit.   Subjective Assessment - 08/24/17 1107    Subjective  Pt. reports having a stroke about a year and a half ago. Pt. reports falling on 07/27/17 due to tripping over husbands foot. Pt. reports increased use of 4WW in the home to get around in just the past 3 weeks. Pt. reports feeling unstable  getting a 'whoozy' feeling. Pt. reports doing ADLs independently. Pt. reports using husband for stability when walking. Pt. reports being fearful and concerned if by self and having to do something around the house.    Patient Stated Goals  being able to walk without being fearful and without having to use someone or an assistive device for stability    Currently in Pain?  Yes    Pain Score  0-No pain    Effect of Pain on Daily Activities  ADLs and household activities with feeling safe         [facility] PT Assessment - 08/24/17 0001       Assessment   Medical Diagnosis  balance    Prior Therapy  a few months ago      Precautions   Precaution Comments  heart catherization a few months ago      Balance Screen   Has the patient fallen in the past 6 months  Yes    How many times?  1 tripped over husband's foot    Has the patient had a decrease in activity level because of a fear of falling?   No    Is the patient reluctant to leave their home because of a fear of falling?   No pt. states because husband is always with her      Home Environment   Additional Comments  uses 4WW around house, about 16 steps in the house      Prior Function   Level of Independence  Independent    Vocation  Retired    Leisure  going to house at [employer], readying, painting, exercise: standing hip ABD/ext. and squats      ROM / Strength   AROM / PROM / Strength  Strength      Strength   Strength Assessment Site  Hip;Knee    Right/Left Hip  Right;Left    Right Hip Flexion  4-/5    Right Hip Extension  3/5    Right Hip External Rotation   4/5    Right Hip Internal Rotation  4/5    Right Hip ABduction  3-/5    Left Hip Flexion  4-/5    Left Hip Extension  3-/5    Left Hip External Rotation  4-/5    Left Hip Internal Rotation  4/5    Left Hip ABduction  2+/5    Right/Left Knee  Right;Left    Right Knee Flexion  4/5    Right Knee Extension  4/5    Left Knee Flexion  4/5    Left Knee Extension  4/5      Standardized Balance Assessment   Standardized Balance Assessment  Berg Balance Test;Timed Up and Go Test;Four Square Step Test;Five Times Sit to Stand    Five times sit to stand comments   did test without using hands to push off and lost balance on 4th sit to stand was unable to do one more after losing balance, took 19 seconds to do 4, pt. reports using handrests to push off normally       [employer]   Sit to Stand  Able to stand  independently using hands can do it without hands if cued/knows the correct way    Standing  Unsupported  Able to stand safely 2 minutes    Sitting with Back Unsupported but Feet Supported on Floor or Stool  Able to sit safely and securely 2 minutes    Stand  to Sit  Controls descent by using hands    Transfers  Able to transfer safely, definite need of hands    Standing Unsupported with Eyes Closed  Able to stand 10 seconds with supervision    Standing Ubsupported with Feet Together  Able to place feet together independently and stand for 1 minute with supervision stumbled at 40 seconds    From Standing, Reach Forward with Outstretched Arm  Can reach confidently >25 cm (10")    From Standing Position, Pick up Object from Floor  Able to pick up shoe, needs supervision slow and hesitant    From Standing Position, Turn to Look Behind Over each Shoulder  Looks behind one side only/other side shows less weight shift    Turn 360 Degrees  Able to turn 360 degrees safely in 4 seconds or less    Standing Unsupported, Alternately Place Feet on Step/Stool  Able to stand independently and safely and complete 8 steps in 20 seconds needs supervision    Standing Unsupported, One Foot in [employer] balance while stepping or standing    Standing on One Leg  Unable to try or needs assist to prevent fall    Total Score  41    Berg comment:  needs supervision to guard and for personal feelings of being safe      Timed Up and Go Test   Normal TUG (seconds)  22      Four Square Step Test    Trial One   14 1 practice prior; lost balance going CW&hit pole going backwards, hands out high for balance             Objective measurements completed on examination: See above findings.      [facility] Adult PT Treatment/Exercise - 08/24/17 0001      Balance   Balance Assessed  Yes             PT Education - 08/24/17 1158    Education provided  Yes    Education Details  balance tests, hip exercises HEP flex,ABD,ext.     Person(s) Educated  Patient    Methods  Explanation;Demonstration     Comprehension  Verbalized understanding;Returned demonstration       PT Short Term Goals - 08/24/17 1203      PT SHORT TERM GOAL #1   Title  independent with her home HEP    Time  2    Period  Weeks    Status  New        PT Long Term Goals - 08/24/17 1204      PT LONG TERM GOAL #1   Title  decrease TUG time to 14 seconds for functional gait and safety    Baseline  22 sec    Time  8    Period  Weeks      PT LONG TERM GOAL #2   Title  increase Berg balance score to 46/56 for safety    Baseline  41/56 supervision needed for every part    Time  8    Period  Weeks    Status  New      PT LONG TERM GOAL #3   Title  increase LE strength (particularly ABD and ext.) to 4/5 for functional gait and safety    Time  8    Period  Weeks    Status  New      PT LONG TERM GOAL #4   Title  be able  to do a sit to stand without assistance and without losing balance for functional use    Time  8    Period  Weeks    Status  New             Plan - 08/24/17 1158    Clinical Impression Statement  Pt. uses hand rests to help push off when getting up from a sit to stand. Pt. is able to not use hands and knows 'nose over toes' but chooses not to do that unless told because she feels unsafe. Pt. is very hesitant and seems fearful to get up or move without either a walker or person to use for assistance. Pt. was unable to tandem stance without losing balance. Pt. was able to catch herself when she lost her balance occasionally.      Clinical Presentation  Evolving    Clinical Decision Making  Low    Rehab Potential  Good    PT Frequency  2x / week    PT Duration  8 weeks    PT Treatment/Interventions  Stair training;Gait training;Functional mobility training;Therapeutic activities;Therapeutic exercise;Balance training;Patient/family education    PT Next Visit Plan  start hip strengthening, proprioception exercises    PT Home Exercise Plan  hip flex, ABD, ext. in standing with walker or  kitchen counter    Consulted and Agree with Plan of Care  Patient       Patient will benefit from skilled therapeutic intervention in order to improve the following deficits and impairments:  Abnormal gait, Decreased strength, Decreased balance, Difficulty walking, Decreased activity tolerance, Decreased coordination, Decreased mobility  Visit Diagnosis: Difficulty in walking, not elsewhere classified  Muscle weakness (generalized)  Anxiety  Balance problem     Problem List Patient Active Problem List   Diagnosis Date Noted  . CAD (coronary artery disease) 06/09/2017  . Abnormal nuclear cardiac imaging test 05/25/2017  . PVC's (premature ventricular contractions) 05/17/2017  . Anxiety 12/29/2016  . Vascular parkinsonism ([place]) 06/29/2016  . Gait disorder 04/07/2016  . Carotid stenosis, asymptomatic, bilateral 02/27/2016  . Left-sided weakness 12/28/2015  . Headache 12/28/2015  . Adjustment reaction with anxiety 12/20/2015  . Left hemiparesis ([place])   . History of CVA with residual deficit   . Prediabetes   . SVT (supraventricular tachycardia) ([place]) 12/14/2015  . Stroke-like symptom 12/13/2015  . Heart palpitations 09/06/2012  . Hypertension   . Hyperlipidemia   . Hypothyroidism   . Vitamin D deficiency   . Palpitations   . History of dizziness   . LVH (left ventricular hypertrophy)   . Aortic stenosis   . Mitral regurgitation   . SOB (shortness of breath)   . Difficulty walking     Stacey Ramirez 08/24/2017, 12:16 PM  [address] [place], [place], [address] Phone: [phone]   Fax:  [phone]  Name: Stacey Ramirez MRN: [id number] Date of Birth: 1940/11/29

## 2017-08-26 ENCOUNTER — Encounter: Payer: Self-pay | Admitting: Physical Therapy

## 2017-08-26 ENCOUNTER — Ambulatory Visit: Payer: PPO | Admitting: Physical Therapy

## 2017-08-26 DIAGNOSIS — R2689 Other abnormalities of gait and mobility: Secondary | ICD-10-CM

## 2017-08-26 DIAGNOSIS — R262 Difficulty in walking, not elsewhere classified: Secondary | ICD-10-CM | POA: Diagnosis not present

## 2017-08-26 DIAGNOSIS — M6281 Muscle weakness (generalized): Secondary | ICD-10-CM

## 2017-08-26 NOTE — Therapy (Signed)
[address] [place], [place], [address] Phone: [phone]   Fax:  [phone]  Physical Therapy Treatment  Patient Details  Name: Stacey Ramirez MRN: [id number] Date of Birth: 1941-04-02 Referring Provider: Deland Pretty   Encounter Date: 08/26/2017  PT End of Session - 08/26/17 1351    Visit Number  2    Date for PT Re-Evaluation  10/22/17    PT Start Time  1301    PT Stop Time  1345    PT Time Calculation (min)  44 min    Activity Tolerance  Patient tolerated treatment well    Behavior During Therapy  [facility] for tasks assessed/performed;Anxious       Past Medical History:  Diagnosis Date  . Adjustment reaction with anxiety 12/20/2015  . Aortic stenosis   . CAD (coronary artery disease) 06/09/2017   Nuc study 10/18: EF 69, inferolateral perfusion defect-possible small infarct with peri-infarct ischemia; intermediate risk // LHC 10/18: pLAD 25, pLCx 50, pRCA 25 >> med Rx  . Carotid stenosis, asymptomatic, bilateral 02/27/2016   Carotid US 5/17: Bilateral ICA 1-39  . Cerebrovascular accident (stroke) ([place])    a. 12/2015 - cryptogenic.  S/P MDT Linq.  . Difficulty walking   . Essential hypertension   . Gait disorder 04/07/2016  . Heart palpitations 09/06/2012  . History of CVA with residual deficit 12/14/2015  . History of dizziness   . History of echocardiogram    Echo 10/18: Vigorous LVF, EF 65-70, normal wall motion, grade 1 diastolic dysfunction, GLS -21.1%, mild RAE  . History of nuclear stress test    Nuclear stress test 10/18: EF 69, inf-lat defect c/w poss infarct with peri-infarct ischemia; Intermediate Risk  . Hyperlipidemia   . Hypothyroidism   . Left hemiparesis ([place])   . LVH (left ventricular hypertrophy)   . Mitral regurgitation    a. 12/2015 Echo: EF 60-65%, mild focal basal hypertrophy. No rwma, triv AI, mild MR, mildly dil LA w/o thrombus. No RA thrombus. No PFO.  Marland Kitchen Palpitations   .  Prediabetes   . Stroke-like symptom 12/13/2015  . SVT (supraventricular tachycardia) ([place]) 12/14/2015  . Vascular parkinsonism ([place]) 06/29/2016  . Vitamin D deficiency     Past Surgical History:  Procedure Laterality Date  . CARDIOVASCULAR STRESS TEST  2002   NORMAL  . EP IMPLANTABLE DEVICE N/A 12/17/2015   Procedure: Loop Recorder Insertion;  Surgeon: [name], MD;  Location: [facility];  Service: Cardiovascular;  Laterality: N/A;  . LEFT HEART CATH AND CORONARY ANGIOGRAPHY N/A 05/25/2017   Procedure: LEFT HEART CATH AND CORONARY ANGIOGRAPHY;  Surgeon: Sherren Mocha, MD;  Location: [place] [facility];  Service: Cardiovascular;  Laterality: N/A;  . LOOP RECORDER IMPLANT    . TEE WITHOUT CARDIOVERSION N/A 12/17/2015   Procedure: TRANSESOPHAGEAL ECHOCARDIOGRAM (TEE);  Surgeon: Lelon Perla, MD;  [facility] ENDOSCOPY;  Service: Cardiovascular;  Laterality: N/A;  Pt also needs a LOOP  . TRANSTHORACIC ECHOCARDIOGRAM  2008   SHOWED LEFT VENTRICULAR HYPERTROPHY AND MILD AORTIC STENOSIS    There were no vitals filed for this visit.  Subjective Assessment - 08/26/17 1304    Subjective  Pt. reports no trips or stumbbling the past couple of days. Pt. reports no use of assistive device after the eval because she thinks she'll become dependent on it. Pt. reports feeling fine.    Patient Stated Goals  being able to walk without being fearful and  without having to use someone or an assistive device for stability                      [facility] Adult PT Treatment/Exercise - 08/26/17 0001      Exercises   Exercises  Knee/Hip;Ankle;Other Exercises      Knee/Hip Exercises: Aerobic   Nustep  L4 x 5 mins    Stepper         Knee/Hip Exercises: Standing   Other Standing Knee Exercises  side stepping, walking backwards w/HHA, step to and step over on 6" step, stepping over bolster needing some HHA light touch, tandem stance walking with HHA down the hallway and back pt. also  uses wall as support occassionally    Other Standing Knee Exercises  standing on airex pad open/closed eyes and then with open eyes reaching out with L hand and then R hand and then both to touch moving object      Knee/Hip Exercises: Seated   Long Arc Quad  2 sets;10 reps;Both;Weights    Long Arc Quad Weight  2 lbs.    Long Arc Quad Limitations       Hamstring Curl  2 sets;10 reps;Both;Limitations    Hamstring Limitations  red t band              PT Education - 08/26/17 1350    Education provided  Yes    Education Details  knee exercises    Person(s) Educated  Patient    Methods  Explanation;Demonstration;Tactile cues;Verbal cues    Comprehension  Verbalized understanding;Returned demonstration       PT Short Term Goals - 08/24/17 1203      PT SHORT TERM GOAL #1   Title  independent with her home HEP    Time  2    Period  Weeks    Status  New        PT Long Term Goals - 08/24/17 1204      PT LONG TERM GOAL #1   Title  decrease TUG time to 14 seconds for functional gait and safety    Baseline  22 sec    Time  8    Period  Weeks      PT LONG TERM GOAL #2   Title  increase Berg balance score to 46/56 for safety    Baseline  41/56 supervision needed for every part    Time  8    Period  Weeks    Status  New      PT LONG TERM GOAL #3   Title  increase LE strength (particularly ABD and ext.) to 4/5 for functional gait and safety    Time  8    Period  Weeks    Status  New      PT LONG TERM GOAL #4   Title  be able to do a sit to stand without assistance and without losing balance for functional use    Time  8    Period  Weeks    Status  New            Plan - 08/26/17 1351    Clinical Impression Statement  Pt. walked in today without assistive device and walked with low guard. When pt. did walking exercises, pt. had high guard and needing HHA for backwards walking, tandem walking. Pt. need lots of verbal cueing to keep toes pointed straight when side  stepping, often times she'd point toes outward,  but was able to correct when cued. Pt. is able to take bigger/normal steps when walking when cued or with HHA. Pt. needs verbal cues to take bigger steps when turning instead of shuffling feet but is able to when cued. Pt. tolerated standing on airex pad with eyes open and closed. When pt. had to reach out to touch a moving object while standing on airex pad, she vocalized feeling unsteady from time to time but was able to correct herself if lost balance only needing HHA sometimes.    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 CAD (coronary artery disease) 06/09/2017  . Abnormal nuclear cardiac imaging test 05/25/2017  . PVC's (premature ventricular contractions) 05/17/2017  . Anxiety 12/29/2016  . Vascular parkinsonism ([place]) 06/29/2016  . Gait disorder 04/07/2016  . Carotid stenosis, asymptomatic, bilateral 02/27/2016  . Left-sided weakness 12/28/2015  . Headache 12/28/2015  . Adjustment reaction with anxiety 12/20/2015  . Left hemiparesis ([place])   . History of CVA with residual deficit   . Prediabetes   . SVT (supraventricular tachycardia) ([place]) 12/14/2015  .  Stroke-like symptom 12/13/2015  . Heart palpitations 09/06/2012  . Hypertension   . Hyperlipidemia   . Hypothyroidism   . Vitamin D deficiency   . Palpitations   . History of dizziness   . LVH (left ventricular hypertrophy)   . Aortic stenosis   . Mitral regurgitation   . SOB (shortness of breath)   . Difficulty walking     Juliann Pulse 08/26/2017, 2:01 PM  [address] [place], [place], [address] Phone: [phone]   Fax:  [phone]  Name: Stacey Ramirez MRN: [id number] Date of Birth: 1941/03/13

## 2017-08-31 ENCOUNTER — Encounter: Payer: Self-pay | Admitting: Physical Therapy

## 2017-08-31 ENCOUNTER — Ambulatory Visit: Payer: PPO | Admitting: Physical Therapy

## 2017-08-31 DIAGNOSIS — R262 Difficulty in walking, not elsewhere classified: Secondary | ICD-10-CM | POA: Diagnosis not present

## 2017-08-31 DIAGNOSIS — M6281 Muscle weakness (generalized): Secondary | ICD-10-CM

## 2017-08-31 DIAGNOSIS — R2689 Other abnormalities of gait and mobility: Secondary | ICD-10-CM

## 2017-08-31 NOTE — Therapy (Signed)
[place], [place], [address] Phone: [phone]   Fax:  [phone]  Physical Therapy Treatment  Patient Details  Name: Stacey Ramirez MRN: [id number] Date of Birth: 1941-01-24 Referring Provider: Deland Pretty   Encounter Date: 08/31/2017  PT End of Session - 08/31/17 1306    Visit Number  3    Date for PT Re-Evaluation  10/22/17    PT Start Time  1225    PT Stop Time  1310    PT Time Calculation (min)  45 min       Past Medical History:  Diagnosis Date  . Adjustment reaction with anxiety 12/20/2015  . Aortic stenosis   . CAD (coronary artery disease) 06/09/2017   Nuc study 10/18: EF 69, inferolateral perfusion defect-possible small infarct with peri-infarct ischemia; intermediate risk // LHC 10/18: pLAD 25, pLCx 50, pRCA 25 >> med Rx  . Carotid stenosis, asymptomatic, bilateral 02/27/2016   Carotid US 5/17: Bilateral ICA 1-39  . Cerebrovascular accident (stroke) ([place])    a. 12/2015 - cryptogenic.  S/P MDT Linq.  . Difficulty walking   . Essential hypertension   . Gait disorder 04/07/2016  . Heart palpitations 09/06/2012  . History of CVA with residual deficit 12/14/2015  . History of dizziness   . History of echocardiogram    Echo 10/18: Vigorous LVF, EF 65-70, normal wall motion, grade 1 diastolic dysfunction, GLS -21.1%, mild RAE  . History of nuclear stress test    Nuclear stress test 10/18: EF 69, inf-lat defect c/w poss infarct with peri-infarct ischemia; Intermediate Risk  . Hyperlipidemia   . Hypothyroidism   . Left hemiparesis ([place])   . LVH (left ventricular hypertrophy)   . Mitral regurgitation    a. 12/2015 Echo: EF 60-65%, mild focal basal hypertrophy. No rwma, triv AI, mild MR, mildly dil LA w/o thrombus. No RA thrombus. No PFO.  Marland Kitchen Palpitations   . Prediabetes   . Stroke-like symptom 12/13/2015  . SVT (supraventricular tachycardia) ([place]) 12/14/2015  . Vascular parkinsonism ([place])  06/29/2016  . Vitamin D deficiency     Past Surgical History:  Procedure Laterality Date  . CARDIOVASCULAR STRESS TEST  2002   NORMAL  . EP IMPLANTABLE DEVICE N/A 12/17/2015   Procedure: Loop Recorder Insertion;  Surgeon: [name], MD;  Location: [place] [facility];  Service: Cardiovascular;  Laterality: N/A;  . LEFT HEART CATH AND CORONARY ANGIOGRAPHY N/A 05/25/2017   Procedure: LEFT HEART CATH AND CORONARY ANGIOGRAPHY;  Surgeon: Sherren Mocha, MD;  Location: [place] [facility];  Service: Cardiovascular;  Laterality: N/A;  . LOOP RECORDER IMPLANT    . TEE WITHOUT CARDIOVERSION N/A 12/17/2015   Procedure: TRANSESOPHAGEAL ECHOCARDIOGRAM (TEE);  Surgeon: Lelon Perla, MD;  Location: [facility] ENDOSCOPY;  Service: Cardiovascular;  Laterality: N/A;  Pt also needs a LOOP  . TRANSTHORACIC ECHOCARDIOGRAM  2008   SHOWED LEFT VENTRICULAR HYPERTROPHY AND MILD AORTIC STENOSIS    There were no vitals filed for this visit.  Subjective Assessment - 08/31/17 1223    Subjective  amb in by mself today. doing leg ex at home and bike at home    Currently in Pain?  No/denies                      [facility] Adult PT Treatment/Exercise - 08/31/17 0001      Ambulation/Gait   Gait Comments  HHA gait with gentle pull to increase stride  and step length 200 feet      High Level Balance   High Level Balance Comments  dynamic activties on airex      Knee/Hip Exercises: Aerobic   Nustep  L4 x 6 mins      Knee/Hip Exercises: Standing   Heel Raises  Both;15 reps mini squat 3#    Walking with Sports Cord  30# fwd/back and side stepping 5 times each min A and increased LOB stepping to the RT d/t left LE weak    Other Standing Knee Exercises  HHA marching fwd and back and side stepping 3# ankle wts 20 feet 2 times each 2 sets    Other Standing Knee Exercises  3# alt step tap 6 inch 20 times each      Knee/Hip Exercises: Seated   Long Arc Quad  2 sets;10 reps;Both;Weights    Long Arc Quad Weight   3 lbs.    Ball Squeeze  20    Other Seated Knee/Hip Exercises  3# hip abd 2 sets 10    Other Seated Knee/Hip Exercises  3# hip flex 2 sets 10               PT Short Term Goals - 08/24/17 1203      PT SHORT TERM GOAL #1   Title  independent with her home HEP    Time  2    Period  Weeks    Status  New        PT Long Term Goals - 08/24/17 1204      PT LONG TERM GOAL #1   Title  decrease TUG time to 14 seconds for functional gait and safety    Baseline  22 sec    Time  8    Period  Weeks      PT LONG TERM GOAL #2   Title  increase Berg balance score to 46/56 for safety    Baseline  41/56 supervision needed for every part    Time  8    Period  Weeks    Status  New      PT LONG TERM GOAL #3   Title  increase LE strength (particularly ABD and ext.) to 4/5 for functional gait and safety    Time  8    Period  Weeks    Status  New      PT LONG TERM GOAL #4   Title  be able to do a sit to stand without assistance and without losing balance for functional use    Time  8    Period  Weeks    Status  New            Plan - 08/31/17 1306    Clinical Impression Statement  decreased control of left LE with activities. improved gait stride an dcadeance with gentle pull. fatigue noted with ex/activities    PT Treatment/Interventions  Stair training;Gait training;Functional mobility training;Therapeutic activities;Therapeutic exercise;Balance training;Patient/family education    PT Next Visit Plan  continue strengthening, more work on airex pad       Patient will benefit from skilled therapeutic intervention in order to improve the following deficits and impairments:  Abnormal gait, Decreased strength, Decreased balance, Difficulty walking, Decreased activity tolerance, Decreased coordination, Decreased mobility  Visit Diagnosis: Balance problem  Difficulty in walking, not elsewhere classified  Muscle weakness (generalized)     Problem List Patient Active  Problem List   Diagnosis Date Noted  . CAD (coronary  artery disease) 06/09/2017  . Abnormal nuclear cardiac imaging test 05/25/2017  . PVC's (premature ventricular contractions) 05/17/2017  . Anxiety 12/29/2016  . Vascular parkinsonism ([place]) 06/29/2016  . Gait disorder 04/07/2016  . Carotid stenosis, asymptomatic, bilateral 02/27/2016  . Left-sided weakness 12/28/2015  . Headache 12/28/2015  . Adjustment reaction with anxiety 12/20/2015  . Left hemiparesis ([place])   . History of CVA with residual deficit   . Prediabetes   . SVT (supraventricular tachycardia) ([place]) 12/14/2015  . Stroke-like symptom 12/13/2015  . Heart palpitations 09/06/2012  . Hypertension   . Hyperlipidemia   . Hypothyroidism   . Vitamin D deficiency   . Palpitations   . History of dizziness   . LVH (left ventricular hypertrophy)   . Aortic stenosis   . Mitral regurgitation   . SOB (shortness of breath)   . Difficulty walking     PAYSEUR,ANGIE PTA 08/31/2017, 1:08 PM  [place] [address] [address] [place], [place], [address] Phone: [phone]   Fax:  [phone]  Name: Stacey Ramirez MRN: [id number] Date of Birth: 1941-03-22

## 2017-09-02 ENCOUNTER — Encounter: Payer: Self-pay | Admitting: Physical Therapy

## 2017-09-02 ENCOUNTER — Ambulatory Visit: Payer: PPO | Admitting: Physical Therapy

## 2017-09-02 DIAGNOSIS — M6281 Muscle weakness (generalized): Secondary | ICD-10-CM

## 2017-09-02 DIAGNOSIS — R2689 Other abnormalities of gait and mobility: Secondary | ICD-10-CM

## 2017-09-02 DIAGNOSIS — R262 Difficulty in walking, not elsewhere classified: Secondary | ICD-10-CM

## 2017-09-02 NOTE — Therapy (Signed)
[name] [place], [place], [address] Phone: [phone]   Fax:  [phone]  Physical Therapy Treatment  Patient Details  Name: Stacey Ramirez MRN: [id number] Date of Birth: 1940/11/07 Referring Provider: Deland Pretty   Encounter Date: 09/02/2017  PT End of Session - 09/02/17 1431    Visit Number  4    Date for PT Re-Evaluation  10/22/17    PT Start Time  1340    PT Stop Time  1426    PT Time Calculation (min)  46 min    Activity Tolerance  Patient tolerated treatment well    Behavior During Therapy  [facility] for tasks assessed/performed;Anxious       Past Medical History:  Diagnosis Date  . Adjustment reaction with anxiety 12/20/2015  . Aortic stenosis   . CAD (coronary artery disease) 06/09/2017   Nuc study 10/18: EF 69, inferolateral perfusion defect-possible small infarct with peri-infarct ischemia; intermediate risk // LHC 10/18: pLAD 25, pLCx 50, pRCA 25 >> med Rx  . Carotid stenosis, asymptomatic, bilateral 02/27/2016   Carotid US 5/17: Bilateral ICA 1-39  . Cerebrovascular accident (stroke) ([place])    a. 12/2015 - cryptogenic.  S/P MDT Linq.  . Difficulty walking   . Essential hypertension   . Gait disorder 04/07/2016  . Heart palpitations 09/06/2012  . History of CVA with residual deficit 12/14/2015  . History of dizziness   . History of echocardiogram    Echo 10/18: Vigorous LVF, EF 65-70, normal wall motion, grade 1 diastolic dysfunction, GLS -21.1%, mild RAE  . History of nuclear stress test    Nuclear stress test 10/18: EF 69, inf-lat defect c/w poss infarct with peri-infarct ischemia; Intermediate Risk  . Hyperlipidemia   . Hypothyroidism   . Left hemiparesis ([place])   . LVH (left ventricular hypertrophy)   . Mitral regurgitation    a. 12/2015 Echo: EF 60-65%, mild focal basal hypertrophy. No rwma, triv AI, mild MR, mildly dil LA w/o thrombus. No RA thrombus. No PFO.  Marland Kitchen Palpitations   .  Prediabetes   . Stroke-like symptom 12/13/2015  . SVT (supraventricular tachycardia) ([place]) 12/14/2015  . Vascular parkinsonism ([place]) 06/29/2016  . Vitamin D deficiency     Past Surgical History:  Procedure Laterality Date  . CARDIOVASCULAR STRESS TEST  2002   NORMAL  . EP IMPLANTABLE DEVICE N/A 12/17/2015   Procedure: Loop Recorder Insertion;  Surgeon: [name], MD;  Location: [place] [facility];  Service: Cardiovascular;  Laterality: N/A;  . LEFT HEART CATH AND CORONARY ANGIOGRAPHY N/A 05/25/2017   Procedure: LEFT HEART CATH AND CORONARY ANGIOGRAPHY;  Surgeon: Sherren Mocha, MD;  Location: [place] [facility];  Service: Cardiovascular;  Laterality: N/A;  . LOOP RECORDER IMPLANT    . TEE WITHOUT CARDIOVERSION N/A 12/17/2015   Procedure: TRANSESOPHAGEAL ECHOCARDIOGRAM (TEE);  Surgeon: Lelon Perla, MD;  Location: [facility] ENDOSCOPY;  Service: Cardiovascular;  Laterality: N/A;  Pt also needs a LOOP  . TRANSTHORACIC ECHOCARDIOGRAM  2008   SHOWED LEFT VENTRICULAR HYPERTROPHY AND MILD AORTIC STENOSIS    There were no vitals filed for this visit.  Subjective Assessment - 09/02/17 1340    Subjective  Pt. reports feeling unsteady and 'whoozy' today but tends to feel like that daily. Pt. reports doing exercises at home and they are going well and bikes at home. Pt. reports sometimes starting out walking with a walker until her body feels a little more 'awake' and she  feels a little more steady.     Currently in Pain?  No/denies    Pain Score  0-No pain                      [facility] Adult PT Treatment/Exercise - 09/02/17 0001      Ambulation/Gait   Gait Comments  tandem walking 100 ft with HHA ,HHA gait with gentle pull to increase stride and step length 200 feet      Knee/Hip Exercises: Aerobic   Nustep  L4 x 6 mins      Knee/Hip Exercises: Standing   Walking with Sports Cord  20# fwd/back x3, #10 side stepping 2x each    Other Standing Knee Exercises  standing on airex pad  and reaching for cones from PT hands on one side and passing them over to the next x2, stepping over bolters and pool noodles x3, weaving in and out of cones x3 pt. needs lots of cues not to shuffle feet when turning    Other Standing Knee Exercises  3# alt step tap 6 inch 20 times each pt. has trouble lifting L leg up to take step      Knee/Hip Exercises: Seated   Long Arc Quad  2 sets;10 reps    Long Arc Quad Weight  3 lbs.    Ball Squeeze  20    Other Seated Knee/Hip Exercises  3# hip ABD 2x10    Other Seated Knee/Hip Exercises  3# hip flex 2x10               PT Short Term Goals - 09/02/17 1435      PT SHORT TERM GOAL #1   Title  independent with her home HEP    Time  2    Period  Weeks    Status  On-going        PT Long Term Goals - 08/24/17 1204      PT LONG TERM GOAL #1   Title  decrease TUG time to 14 seconds for functional gait and safety    Baseline  22 sec    Time  8    Period  Weeks      PT LONG TERM GOAL #2   Title  increase Berg balance score to 46/56 for safety    Baseline  41/56 supervision needed for every part    Time  8    Period  Weeks    Status  New      PT LONG TERM GOAL #3   Title  increase LE strength (particularly ABD and ext.) to 4/5 for functional gait and safety    Time  8    Period  Weeks    Status  New      PT LONG TERM GOAL #4   Title  be able to do a sit to stand without assistance and without losing balance for functional use    Time  8    Period  Weeks    Status  New            Plan - 09/02/17 1432    Clinical Impression Statement  Pt. seemed to fatigue a little easier today and reported feeling more 'whoozy' than normal which could have contributed to decrease exercise tolerance. Pt. needs lots of cues to pick up L leg to step over objects, increase stride length with normal walking, and tpo not shuffle feet when making turns. Pt. needs gentle pull with  gait and lots of verbal feedback to increase stride length and  cadence. Pt. has very little control walking back from forward walking with sports cord and needs close guarding for that exercise.     Rehab Potential  Good    PT Frequency  2x / week    PT Duration  8 weeks    PT Treatment/Interventions  Stair training;Gait training;Functional mobility training;Therapeutic activities;Therapeutic exercise;Balance training;Patient/family education    PT Next Visit Plan  continue strengthening, more work on Equities trader and Agree with Plan of Care  Patient       Patient will benefit from skilled therapeutic intervention in order to improve the following deficits and impairments:  Abnormal gait, Decreased strength, Decreased balance, Difficulty walking, Decreased activity tolerance, Decreased coordination, Decreased mobility  Visit Diagnosis: Difficulty in walking, not elsewhere classified  Balance problem  Muscle weakness (generalized)     Problem List Patient Active Problem List   Diagnosis Date Noted  . CAD (coronary artery disease) 06/09/2017  . Abnormal nuclear cardiac imaging test 05/25/2017  . PVC's (premature ventricular contractions) 05/17/2017  . Anxiety 12/29/2016  . Vascular parkinsonism ([place]) 06/29/2016  . Gait disorder 04/07/2016  . Carotid stenosis, asymptomatic, bilateral 02/27/2016  . Left-sided weakness 12/28/2015  . Headache 12/28/2015  . Adjustment reaction with anxiety 12/20/2015  . Left hemiparesis ([place])   . History of CVA with residual deficit   . Prediabetes   . SVT (supraventricular tachycardia) ([place]) 12/14/2015  . Stroke-like symptom 12/13/2015  . Heart palpitations 09/06/2012  . Hypertension   . Hyperlipidemia   . Hypothyroidism   . Vitamin D deficiency   . Palpitations   . History of dizziness   . LVH (left ventricular hypertrophy)   . Aortic stenosis   . Mitral regurgitation   . SOB (shortness of breath)   . Difficulty walking     Juliann Pulse SPT 09/02/2017, 2:37 PM  [place], [place], [address] Phone: [phone]   Fax:  [phone]  Name: BRITTINEY DICOSTANZO MRN: [id number] Date of Birth: 1941-04-16

## 2017-09-07 ENCOUNTER — Encounter: Payer: Self-pay | Admitting: Physical Therapy

## 2017-09-07 ENCOUNTER — Ambulatory Visit: Payer: PPO | Attending: Internal Medicine | Admitting: Physical Therapy

## 2017-09-07 DIAGNOSIS — R2689 Other abnormalities of gait and mobility: Secondary | ICD-10-CM | POA: Diagnosis not present

## 2017-09-07 DIAGNOSIS — R262 Difficulty in walking, not elsewhere classified: Secondary | ICD-10-CM | POA: Insufficient documentation

## 2017-09-07 DIAGNOSIS — F419 Anxiety disorder, unspecified: Secondary | ICD-10-CM | POA: Insufficient documentation

## 2017-09-07 DIAGNOSIS — M6281 Muscle weakness (generalized): Secondary | ICD-10-CM | POA: Diagnosis not present

## 2017-09-07 NOTE — Therapy (Signed)
[place], [place], [address] Phone: [phone]   Fax:  [phone]  Physical Therapy Treatment  Patient Details  Name: Stacey Ramirez MRN: [id number] Date of Birth: 12/24/1940 Referring Provider: Deland Pretty   Encounter Date: 09/07/2017  PT End of Session - 09/07/17 1350    Visit Number  5    Date for PT Re-Evaluation  10/22/17    PT Start Time  1300    PT Stop Time  1345    PT Time Calculation (min)  45 min    Activity Tolerance  Patient tolerated treatment well    Behavior During Therapy  [facility] for tasks assessed/performed       Past Medical History:  Diagnosis Date  . Adjustment reaction with anxiety 12/20/2015  . Aortic stenosis   . CAD (coronary artery disease) 06/09/2017   Nuc study 10/18: EF 69, inferolateral perfusion defect-possible small infarct with peri-infarct ischemia; intermediate risk // LHC 10/18: pLAD 25, pLCx 50, pRCA 25 >> med Rx  . Carotid stenosis, asymptomatic, bilateral 02/27/2016   Carotid US 5/17: Bilateral ICA 1-39  . Cerebrovascular accident (stroke) ([place])    a. 12/2015 - cryptogenic.  S/P MDT Linq.  . Difficulty walking   . Essential hypertension   . Gait disorder 04/07/2016  . Heart palpitations 09/06/2012  . History of CVA with residual deficit 12/14/2015  . History of dizziness   . History of echocardiogram    Echo 10/18: Vigorous LVF, EF 65-70, normal wall motion, grade 1 diastolic dysfunction, GLS -21.1%, mild RAE  . History of nuclear stress test    Nuclear stress test 10/18: EF 69, inf-lat defect c/w poss infarct with peri-infarct ischemia; Intermediate Risk  . Hyperlipidemia   . Hypothyroidism   . Left hemiparesis ([place])   . LVH (left ventricular hypertrophy)   . Mitral regurgitation    a. 12/2015 Echo: EF 60-65%, mild focal basal hypertrophy. No rwma, triv AI, mild MR, mildly dil LA w/o thrombus. No RA thrombus. No PFO.  Marland Kitchen Palpitations   . Prediabetes   .  Stroke-like symptom 12/13/2015  . SVT (supraventricular tachycardia) ([place]) 12/14/2015  . Vascular parkinsonism ([place]) 06/29/2016  . Vitamin D deficiency     Past Surgical History:  Procedure Laterality Date  . CARDIOVASCULAR STRESS TEST  2002   NORMAL  . EP IMPLANTABLE DEVICE N/A 12/17/2015   Procedure: Loop Recorder Insertion;  Surgeon: [name], MD;  Location: [place] [facility];  Service: Cardiovascular;  Laterality: N/A;  . LEFT HEART CATH AND CORONARY ANGIOGRAPHY N/A 05/25/2017   Procedure: LEFT HEART CATH AND CORONARY ANGIOGRAPHY;  Surgeon: Sherren Mocha, MD;  Location: [place] [facility];  Service: Cardiovascular;  Laterality: N/A;  . LOOP RECORDER IMPLANT    . TEE WITHOUT CARDIOVERSION N/A 12/17/2015   Procedure: TRANSESOPHAGEAL ECHOCARDIOGRAM (TEE);  Surgeon: Lelon Perla, MD;  Location: [facility] ENDOSCOPY;  Service: Cardiovascular;  Laterality: N/A;  Pt also needs a LOOP  . TRANSTHORACIC ECHOCARDIOGRAM  2008   SHOWED LEFT VENTRICULAR HYPERTROPHY AND MILD AORTIC STENOSIS    There were no vitals filed for this visit.  Subjective Assessment - 09/07/17 1300    Subjective  Pt. reports feeling more 'whoozy' today. Pt. reports no stumbles or falls. Pt. reports using her walker a little more over the the past day or two.    Currently in Pain?  No/denies    Pain Score  0-No pain  [facility] PT Assessment - 09/07/17 0001      Timed Up and Go Test   Normal TUG (seconds)  19                  [facility] Adult PT Treatment/Exercise - 09/07/17 0001      High Level Balance   High Level Balance Comments  figure 8 turns, walking outside up an incline and down a decline and up and over curbs changing surface level      Knee/Hip Exercises: Aerobic   Nustep  L5 x 6 mins      Knee/Hip Exercises: Standing   Other Standing Knee Exercises  forward/backward marching and side stepping with 3# stand by assistance    Other Standing Knee Exercises  3# alt step tap 6 inch 20 times  each      Knee/Hip Exercises: Seated   Ball Squeeze  20    Other Seated Knee/Hip Exercises  3# hip flex 20 reps 2 sets    Abduction/Adduction   Strengthening;2 sets;10 reps;Limitations    Abd/Adduction Limitations  ABD with red t band    Sit to Sand  2 sets;10 reps               PT Short Term Goals - 09/02/17 1435      PT SHORT TERM GOAL #1   Title  independent with her home HEP    Time  2    Period  Weeks    Status  On-going        PT Long Term Goals - 09/07/17 1355      PT LONG TERM GOAL #1   Title  decrease TUG time to 14 seconds for functional gait and safety    Baseline  new TUG time = 19 seconds    Time  8    Period  Weeks    Status  On-going            Plan - 09/07/17 1350    Clinical Impression Statement  Pt. tolerated tx well. Pt. reported feeling whoozy again today and has been dealing with a lot of emotions due to a friend who passed away yesterday which she thinks has contributed to her 'whooziness'. Pt. needed lots of cues to increase stride length and increase cadence when walking outside and negotiating the curbs/various surface levels. Pt. did very well with the figure 8 taking longer strides than usual and not shuffling feel. Pt. does well walking without HHA if constantly reminded to increase cadence and stride length. Pt. sometimes needs cues to pick up L leg to do step ups and marching.    Rehab Potential  Good    PT Frequency  2x / week    PT Duration  8 weeks    PT Treatment/Interventions  Stair training;Gait training;Functional mobility training;Therapeutic activities;Therapeutic exercise;Balance training;Patient/family education    PT Next Visit Plan  continue strengthening, more work on Equities trader and Agree with Plan of Care  Patient       Patient will benefit from skilled therapeutic intervention in order to improve the following deficits and impairments:  Abnormal gait, Decreased strength, Decreased balance, Difficulty  walking, Decreased activity tolerance, Decreased coordination, Decreased mobility  Visit Diagnosis: Balance problem  Difficulty in walking, not elsewhere classified  Muscle weakness (generalized)     Problem List Patient Active Problem List   Diagnosis Date Noted  . CAD (coronary artery disease) 06/09/2017  . Abnormal nuclear cardiac imaging  test 05/25/2017  . PVC's (premature ventricular contractions) 05/17/2017  . Anxiety 12/29/2016  . Vascular parkinsonism ([place]) 06/29/2016  . Gait disorder 04/07/2016  . Carotid stenosis, asymptomatic, bilateral 02/27/2016  . Left-sided weakness 12/28/2015  . Headache 12/28/2015  . Adjustment reaction with anxiety 12/20/2015  . Left hemiparesis ([place])   . History of CVA with residual deficit   . Prediabetes   . SVT (supraventricular tachycardia) ([place]) 12/14/2015  . Stroke-like symptom 12/13/2015  . Heart palpitations 09/06/2012  . Hypertension   . Hyperlipidemia   . Hypothyroidism   . Vitamin D deficiency   . Palpitations   . History of dizziness   . LVH (left ventricular hypertrophy)   . Aortic stenosis   . Mitral regurgitation   . SOB (shortness of breath)   . Difficulty walking   During this treatment session, the therapist was present, participating in and directing the treatment.   Juliann Pulse SPT 09/07/2017, 1:57 PM  [place] [address] [place], [place], [address] Phone: [phone]   Fax:  [phone]  Name: Stacey Ramirez MRN: [id number] Date of Birth: 12-02-1940

## 2017-09-08 ENCOUNTER — Ambulatory Visit (INDEPENDENT_AMBULATORY_CARE_PROVIDER_SITE_OTHER): Payer: PPO | Admitting: *Deleted

## 2017-09-08 DIAGNOSIS — I63231 Cerebral infarction due to unspecified occlusion or stenosis of right carotid arteries: Secondary | ICD-10-CM

## 2017-09-08 NOTE — Progress Notes (Signed)
Carelink Summary Report / Loop Recorder 

## 2017-09-09 ENCOUNTER — Ambulatory Visit: Payer: PPO | Admitting: Physical Therapy

## 2017-09-13 ENCOUNTER — Encounter: Payer: Self-pay | Admitting: Cardiovascular Disease

## 2017-09-13 ENCOUNTER — Ambulatory Visit: Payer: PPO | Admitting: Cardiovascular Disease

## 2017-09-13 VITALS — BP 151/78 | HR 81 | Ht 62.0 in | Wt 120.4 lb

## 2017-09-13 DIAGNOSIS — E782 Mixed hyperlipidemia: Secondary | ICD-10-CM

## 2017-09-13 NOTE — Progress Notes (Signed)
Office Visit    Patient Name: Stacey Ramirez Date of Encounter: 09/13/2017  Primary Care Provider:  Deland Pretty, MD Primary Cardiologist:  Vaughan Browner, MD  P. Ein Rijo, MD    77 year old female with a history of palpitations, hypertension, hyperlipidemia, and recent stroke who presents for follow-up.    Past Medical History    Past Medical History:  Diagnosis Date  . Adjustment reaction with anxiety 12/20/2015  . Aortic stenosis   . CAD (coronary artery disease) 06/09/2017   Nuc study 10/18: EF 69, inferolateral perfusion defect-possible small infarct with peri-infarct ischemia; intermediate risk // LHC 10/18: pLAD 25, pLCx 50, pRCA 25 >> med Rx  . Carotid stenosis, asymptomatic, bilateral 02/27/2016   Carotid US 5/17: Bilateral ICA 1-39  . Cerebrovascular accident (stroke) ([place])    a. 12/2015 - cryptogenic.  S/P MDT Linq.  . Difficulty walking   . Essential hypertension   . Gait disorder 04/07/2016  . Heart palpitations 09/06/2012  . History of CVA with residual deficit 12/14/2015  . History of dizziness   . History of echocardiogram    Echo 10/18: Vigorous LVF, EF 65-70, normal wall motion, grade 1 diastolic dysfunction, GLS -21.1%, mild RAE  . History of nuclear stress test    Nuclear stress test 10/18: EF 69, inf-lat defect c/w poss infarct with peri-infarct ischemia; Intermediate Risk  . Hyperlipidemia   . Hypothyroidism   . Left hemiparesis ([place])   . LVH (left ventricular hypertrophy)   . Mitral regurgitation    a. 12/2015 Echo: EF 60-65%, mild focal basal hypertrophy. No rwma, triv AI, mild MR, mildly dil LA w/o thrombus. No RA thrombus. No PFO.  Marland Kitchen Palpitations   . Prediabetes   . Stroke-like symptom 12/13/2015  . SVT (supraventricular tachycardia) ([place]) 12/14/2015  . Vascular parkinsonism ([place]) 06/29/2016  . Vitamin D deficiency    Past Surgical History:  Procedure Laterality Date  . CARDIOVASCULAR STRESS TEST  2002   NORMAL  . EP IMPLANTABLE DEVICE N/A 12/17/2015     Procedure: Loop Recorder Insertion;  Surgeon: [name], MD;  Location: [facility];  Service: Cardiovascular;  Laterality: N/A;  . LEFT HEART CATH AND CORONARY ANGIOGRAPHY N/A 05/25/2017   Procedure: LEFT HEART CATH AND CORONARY ANGIOGRAPHY;  Surgeon: Sherren Mocha, MD;  Location: [facility];  Service: Cardiovascular;  Laterality: N/A;  . LOOP RECORDER IMPLANT    . TEE WITHOUT CARDIOVERSION N/A 12/17/2015   Procedure: TRANSESOPHAGEAL ECHOCARDIOGRAM (TEE);  Surgeon: Lelon Perla, MD;  Location: [facility] ENDOSCOPY;  Service: Cardiovascular;  Laterality: N/A;  Pt also needs a LOOP  . TRANSTHORACIC ECHOCARDIOGRAM  2008   SHOWED LEFT VENTRICULAR HYPERTROPHY AND MILD AORTIC STENOSIS    Allergies  Allergies  Allergen Reactions  . Sulfa Drugs Cross Reactors Itching  . Zocor [Simvastatin] Other (See Comments)    Feels like she is going to pass out, weakness  . Crestor [Rosuvastatin Calcium]     Muscle weakness  . Statins Other (See Comments)    Unable to walk or stand. Unable to walk or stand.  . Micardis [Telmisartan] Other (See Comments)    Feels like she is going to pass out    Notes from Ignacia Bayley, NP    77 year old female previously followed by Dr. Mare Ferrari for hypertension and palpitations. She recently was admitted for cryptogenic stroke (right basal ganglia infarct), and underwent TEE revealing normal LV function without evidence of atrial thrombus. She subsequently underwent placement of an implantable loop  recorder. She has had no events on this recorder up to this point. She has been recovering well from her stroke and can now walk, though with a limp. She is encouraged overall. Her blood pressure at home has been running in the 140s to 150s, and her husband has been checking it about 3 times per day. She has not been having any palpitations, chest pain, dyspnea, PND, orthopnea, dizziness, syncope, edema, or early satiety.  Oct. 20 ,2017: Stacey Ramirez is seen for the  first time today.  Transfer from [place] . Has some leg / feet swelling  - likely due to the amlodipine .   Dec 01, 2016:  Is having some difficulty in walking since her stroke.   Working with PT.  Brought her BP log with her.   Has some elevated readings and then later will have low readings.  Suggested that she might be eating salty foods at times .   No CP or dyspnea    Has an implantable loop recorder.   Has not had any syncope Has not had it interrogated.      Has poor balance   Feb. 11, 2019  She has history of hypertension and hyperlipidemia. She was admitted with some shortness of breath  Cath 05/25/17  revealed mild - moderate CAD ,  No obstructive lesion s  Has done well since then .  Has some DOE with exercise  Is still very limited by her stroke   Home Medications    Prior to Admission medications   Medication Sig Start Date End Date Taking? Authorizing Provider  ALPRAZolam (XANAX) 0.25 MG tablet Take 1 tablet (0.25 mg total) by mouth at bedtime. 12/25/15  Yes Ivan Anchors Love, PA-C  amLODipine (NORVASC) 10 MG tablet Take 1 tablet (10 mg total) by mouth 2 (two) times daily. Reported on 12/27/2015 01/23/16  Yes Rogelia Mire, NP  aspirin EC 325 MG EC tablet Take 1 tablet (325 mg total) by mouth daily. 12/17/15  Yes Lavina Hamman, MD  B Complex-C-Folic Acid (STRESS FORMULA) TABS Take 1 tablet by mouth daily.    Yes Historical Provider, MD  carboxymethylcellulose (REFRESH PLUS) 0.5 % SOLN Place 1 drop into both eyes 3 (three) times daily as needed.   Yes Historical Provider, MD  clopidogrel (PLAVIX) 75 MG tablet Take 1 tablet (75 mg total) by mouth daily. 12/24/15  Yes Ivan Anchors Love, PA-C  estradiol (ESTRACE) 0.1 MG/GM vaginal cream Place 2 g vaginally as needed (dryness).    Yes Historical Provider, MD  irbesartan (AVAPRO) 300 MG tablet Take 1 tablet (300 mg total) by mouth daily. 12/29/15  Yes Velvet Bathe, MD  levothyroxine (SYNTHROID, LEVOTHROID) 25 MCG tablet Take 25  mcg by mouth daily before breakfast.  09/21/14  Yes Historical Provider, MD  LOTEMAX 0.5 % ophthalmic suspension Place 1 drop into both eyes 4 (four) times daily as needed. For dry itchy eyes 02/17/13  Yes Historical Provider, MD  metoprolol tartrate (LOPRESSOR) 25 MG tablet Take 0.5 tablets (12.5 mg total) by mouth 2 (two) times daily. 12/24/15  Yes Ivan Anchors Love, PA-C  Multiple Vitamin (MULTIVITAMIN WITH MINERALS) TABS tablet Take 1 tablet by mouth daily.   Yes Historical Provider, MD  polyethylene glycol (MIRALAX / GLYCOLAX) packet Take 17 g by mouth daily. Patient taking differently: Take 17 g by mouth daily as needed for moderate constipation.  12/17/15  Yes Lavina Hamman, MD  sertraline (ZOLOFT) 50 MG tablet Take 50 mg by mouth daily.  01/02/16  Yes Historical Provider, MD    Review of Systems    Strength is been improving since her stroke. She continues to walk with a limp but overall is pleased with her progression. She denies chest pain, palpitations, PND, orthopnea, dizziness, syncope, edema, or early satiety.  All other systems reviewed and are otherwise negative except as noted above.  Physical Exam: Blood pressure (!) 151/78, pulse 81, height 5\' 2"  (1.575 m), weight 120 lb 6.4 oz (54.6 kg), SpO2 99 %.  GEN:  Well nourished, well developed in no acute distress HEENT: Normal NECK: No JVD; No carotid bruits LYMPHATICS: No lymphadenopathy CARDIAC: RR, normal S1S2  RESPIRATORY:  Clear to auscultation without rales, wheezing or rhonchi  ABDOMEN: Soft, non-tender, non-distended MUSCULOSKELETAL:  No edema; No deformity  SKIN: Warm and dry NEUROLOGIC:  Alert and oriented x 3  Accessory Clinical Findings    ECG ,    Assessment & Plan    1.  Cryptogenic stroke:  S/p implantable loop recorder   2. Essential hypertension:   BP is mildly elevated here.   Readings are all good at home    Is typicaly elevated at the doctors office   4. Hyperlipidemia:   Intol to statins. [employer]-9 inhib  did not work Suggested Bempoic acid.   She doesn't want to consider that right now    5. CAD - mild - mod    Mertie Moores, MD  09/13/2017 2:30 PM    [address] [place],  [place], [place]  [address] Pager [phone] Phone: [phone]; Fax: [phone]

## 2017-09-13 NOTE — Patient Instructions (Signed)

## 2017-09-14 ENCOUNTER — Ambulatory Visit: Payer: PPO | Admitting: Physical Therapy

## 2017-09-14 ENCOUNTER — Encounter: Payer: Self-pay | Admitting: Physical Therapy

## 2017-09-14 DIAGNOSIS — R2689 Other abnormalities of gait and mobility: Secondary | ICD-10-CM | POA: Diagnosis not present

## 2017-09-14 DIAGNOSIS — M6281 Muscle weakness (generalized): Secondary | ICD-10-CM

## 2017-09-14 DIAGNOSIS — F419 Anxiety disorder, unspecified: Secondary | ICD-10-CM

## 2017-09-14 DIAGNOSIS — R262 Difficulty in walking, not elsewhere classified: Secondary | ICD-10-CM

## 2017-09-14 LAB — BASIC METABOLIC PANEL
BUN/Creatinine Ratio: 19 (ref 12–28)
BUN: 12 mg/dL (ref 8–27)
CALCIUM: 10 mg/dL (ref 8.7–10.3)
CO2: 23 mmol/L (ref 20–29)
CREATININE: 0.64 mg/dL (ref 0.57–1.00)
Chloride: 102 mmol/L (ref 96–106)
GFR calc non Af Amer: 87 mL/min/{1.73_m2} (ref >59)
GFR, EST AFRICAN AMERICAN: 100 mL/min/{1.73_m2} (ref >59)
Glucose: 88 mg/dL (ref 65–99)
POTASSIUM: 4.5 mmol/L (ref 3.5–5.2)
Sodium: 141 mmol/L (ref 134–144)

## 2017-09-14 LAB — LIPID PANEL
CHOLESTEROL TOTAL: 385 mg/dL — AB (ref 100–199)
Chol/HDL Ratio: 8.2 ratio — ABNORMAL HIGH (ref 0.0–4.4)
HDL: 47 mg/dL (ref >39)
LDL Calculated: 301 mg/dL — ABNORMAL HIGH (ref 0–99)
TRIGLYCERIDES: 183 mg/dL — AB (ref 0–149)
VLDL Cholesterol Cal: 37 mg/dL (ref 5–40)

## 2017-09-14 LAB — HEPATIC FUNCTION PANEL
ALBUMIN: 4.5 g/dL (ref 3.5–4.8)
ALT: 15 IU/L (ref 0–32)
AST: 19 IU/L (ref 0–40)
Alkaline Phosphatase: 70 IU/L (ref 39–117)
BILIRUBIN TOTAL: 0.3 mg/dL (ref 0.0–1.2)
BILIRUBIN, DIRECT: 0.08 mg/dL (ref 0.00–0.40)
Total Protein: 7.3 g/dL (ref 6.0–8.5)

## 2017-09-14 NOTE — Therapy (Signed)
[place], [place], [address] Phone: [phone]   Fax:  [phone]  Physical Therapy Treatment  Patient Details  Name: Stacey Ramirez MRN: [id number] Date of Birth: 1940/09/09 Referring Provider: Deland Pretty   Encounter Date: 09/14/2017  PT End of Session - 09/14/17 1357    Visit Number  6    Date for PT Re-Evaluation  10/22/17    PT Start Time  1315    PT Stop Time  1402    PT Time Calculation (min)  47 min       Past Medical History:  Diagnosis Date  . Adjustment reaction with anxiety 12/20/2015  . Aortic stenosis   . CAD (coronary artery disease) 06/09/2017   Nuc study 10/18: EF 69, inferolateral perfusion defect-possible small infarct with peri-infarct ischemia; intermediate risk // LHC 10/18: pLAD 25, pLCx 50, pRCA 25 >> med Rx  . Carotid stenosis, asymptomatic, bilateral 02/27/2016   Carotid US 5/17: Bilateral ICA 1-39  . Cerebrovascular accident (stroke) ([place])    a. 12/2015 - cryptogenic.  S/P MDT Linq.  . Difficulty walking   . Essential hypertension   . Gait disorder 04/07/2016  . Heart palpitations 09/06/2012  . History of CVA with residual deficit 12/14/2015  . History of dizziness   . History of echocardiogram    Echo 10/18: Vigorous LVF, EF 65-70, normal wall motion, grade 1 diastolic dysfunction, GLS -21.1%, mild RAE  . History of nuclear stress test    Nuclear stress test 10/18: EF 69, inf-lat defect c/w poss infarct with peri-infarct ischemia; Intermediate Risk  . Hyperlipidemia   . Hypothyroidism   . Left hemiparesis ([place])   . LVH (left ventricular hypertrophy)   . Mitral regurgitation    a. 12/2015 Echo: EF 60-65%, mild focal basal hypertrophy. No rwma, triv AI, mild MR, mildly dil LA w/o thrombus. No RA thrombus. No PFO.  Marland Kitchen Palpitations   . Prediabetes   . Stroke-like symptom 12/13/2015  . SVT (supraventricular tachycardia) ([place]) 12/14/2015  . Vascular parkinsonism ([place])  06/29/2016  . Vitamin D deficiency     Past Surgical History:  Procedure Laterality Date  . CARDIOVASCULAR STRESS TEST  2002   NORMAL  . EP IMPLANTABLE DEVICE N/A 12/17/2015   Procedure: Loop Recorder Insertion;  Surgeon: [name], MD;  Location: [facility];  Service: Cardiovascular;  Laterality: N/A;  . LEFT HEART CATH AND CORONARY ANGIOGRAPHY N/A 05/25/2017   Procedure: LEFT HEART CATH AND CORONARY ANGIOGRAPHY;  Surgeon: Sherren Mocha, MD;  Location: [facility];  Service: Cardiovascular;  Laterality: N/A;  . LOOP RECORDER IMPLANT    . TEE WITHOUT CARDIOVERSION N/A 12/17/2015   Procedure: TRANSESOPHAGEAL ECHOCARDIOGRAM (TEE);  Surgeon: Lelon Perla, MD;  Location: [facility] ENDOSCOPY;  Service: Cardiovascular;  Laterality: N/A;  Pt also needs a LOOP  . TRANSTHORACIC ECHOCARDIOGRAM  2008   SHOWED LEFT VENTRICULAR HYPERTROPHY AND MILD AORTIC STENOSIS    There were no vitals filed for this visit.  Subjective Assessment - 09/14/17 1318    Subjective  moving slow today. friends death emotionally drained me    Currently in Pain?  No/denies                      [facility] Adult PT Treatment/Exercise - 09/14/17 0001      Ambulation/Gait   Gait Comments  working on balance and continuous mvmt walking with ball toss- VCing and tatcile cuing  High Level Balance   High Level Balance Activities  Side stepping;Backward walking;Sudden stops;Head turns;Marching forwards;Marching backwards;Negotitating around obstacles;Negotiating over obstacles    High Level Balance Comments  --      Knee/Hip Exercises: Aerobic   Nustep  L5 x 6 mins      Knee/Hip Exercises: Machines for Strengthening   Cybex Knee Extension  10# 2 sets 10    Cybex Knee Flexion  20# 2 sets 10      Knee/Hip Exercises: Seated   Sit to Sand  10 reps;with UE support on airex               PT Short Term Goals - 09/02/17 1435      PT SHORT TERM GOAL #1   Title  independent with her  home HEP    Time  2    Period  Weeks    Status  On-going        PT Long Term Goals - 09/07/17 1355      PT LONG TERM GOAL #1   Title  decrease TUG time to 14 seconds for functional gait and safety    Baseline  new TUG time = 19 seconds    Time  8    Period  Weeks    Status  On-going            Plan - 09/14/17 1358    Clinical Impression Statement  constant verb and tatcile cuing needed with balance and gait activitiy. with tatcile cuing pt can increase cadeance and stride. when looking up consistant LOB backwards    PT Treatment/Interventions  Stair training;Gait training;Functional mobility training;Therapeutic activities;Therapeutic exercise;Balance training;Patient/family education    PT Next Visit Plan  continue strengthening/gait and balance       Patient will benefit from skilled therapeutic intervention in order to improve the following deficits and impairments:  Abnormal gait, Decreased strength, Decreased balance, Difficulty walking, Decreased activity tolerance, Decreased coordination, Decreased mobility  Visit Diagnosis: Balance problem  Muscle weakness (generalized)  Difficulty in walking, not elsewhere classified  Anxiety     Problem List Patient Active Problem List   Diagnosis Date Noted  . CAD (coronary artery disease) 06/09/2017  . Abnormal nuclear cardiac imaging test 05/25/2017  . PVC's (premature ventricular contractions) 05/17/2017  . Anxiety 12/29/2016  . Vascular parkinsonism ([place]) 06/29/2016  . Gait disorder 04/07/2016  . Carotid stenosis, asymptomatic, bilateral 02/27/2016  . Left-sided weakness 12/28/2015  . Headache 12/28/2015  . Adjustment reaction with anxiety 12/20/2015  . Left hemiparesis ([place])   . History of CVA with residual deficit   . Prediabetes   . SVT (supraventricular tachycardia) ([place]) 12/14/2015  . Stroke-like symptom 12/13/2015  . Heart palpitations 09/06/2012  . Hypertension   . Hyperlipidemia   .  Hypothyroidism   . Vitamin D deficiency   . Palpitations   . History of dizziness   . LVH (left ventricular hypertrophy)   . Aortic stenosis   . Mitral regurgitation   . SOB (shortness of breath)   . Difficulty walking     Hardy Harcum,ANGIE PTA 09/14/2017, 2:00 PM  [place] [address] [place], [place], [address] Phone: [phone]   Fax:  [phone]  Name: Stacey Ramirez MRN: [id number] Date of Birth: 23-Jul-1941

## 2017-09-16 ENCOUNTER — Encounter: Payer: Self-pay | Admitting: Physical Therapy

## 2017-09-16 ENCOUNTER — Ambulatory Visit: Payer: PPO | Admitting: Physical Therapy

## 2017-09-16 DIAGNOSIS — R2689 Other abnormalities of gait and mobility: Secondary | ICD-10-CM | POA: Diagnosis not present

## 2017-09-16 DIAGNOSIS — M6281 Muscle weakness (generalized): Secondary | ICD-10-CM

## 2017-09-16 DIAGNOSIS — R262 Difficulty in walking, not elsewhere classified: Secondary | ICD-10-CM

## 2017-09-16 NOTE — Therapy (Signed)
During this treatment session, the therapist was present, participating in and directing the treatment. [place], [place], [address] Phone: [phone]   Fax:  [phone]  Physical Therapy Treatment  Patient Details  Name: Stacey Ramirez MRN: [id number] Date of Birth: Jul 15, 1941 Referring Provider: Deland Pretty   Encounter Date: 09/16/2017  PT End of Session - 09/16/17 1541    Visit Number  7    Date for PT Re-Evaluation  10/22/17    PT Start Time  1445    PT Stop Time  1530    PT Time Calculation (min)  45 min    Activity Tolerance  Patient tolerated treatment well    Behavior During Therapy  [facility] for tasks assessed/performed       Past Medical History:  Diagnosis Date  . Adjustment reaction with anxiety 12/20/2015  . Aortic stenosis   . CAD (coronary artery disease) 06/09/2017   Nuc study 10/18: EF 69, inferolateral perfusion defect-possible small infarct with peri-infarct ischemia; intermediate risk // LHC 10/18: pLAD 25, pLCx 50, pRCA 25 >> med Rx  . Carotid stenosis, asymptomatic, bilateral 02/27/2016   Carotid US 5/17: Bilateral ICA 1-39  . Cerebrovascular accident (stroke) ([place])    a. 12/2015 - cryptogenic.  S/P MDT Linq.  . Difficulty walking   . Essential hypertension   . Gait disorder 04/07/2016  . Heart palpitations 09/06/2012  . History of CVA with residual deficit 12/14/2015  . History of dizziness   . History of echocardiogram    Echo 10/18: Vigorous LVF, EF 65-70, normal wall motion, grade 1 diastolic dysfunction, GLS -21.1%, mild RAE  . History of nuclear stress test    Nuclear stress test 10/18: EF 69, inf-lat defect c/w poss infarct with peri-infarct ischemia; Intermediate Risk  . Hyperlipidemia   . Hypothyroidism   . Left hemiparesis ([place])   . LVH (left ventricular hypertrophy)   . Mitral regurgitation    a. 12/2015 Echo: EF 60-65%, mild focal basal hypertrophy. No rwma,  triv AI, mild MR, mildly dil LA w/o thrombus. No RA thrombus. No PFO.  Marland Kitchen Palpitations   . Prediabetes   . Stroke-like symptom 12/13/2015  . SVT (supraventricular tachycardia) ([place]) 12/14/2015  . Vascular parkinsonism ([place]) 06/29/2016  . Vitamin D deficiency     Past Surgical History:  Procedure Laterality Date  . CARDIOVASCULAR STRESS TEST  2002   NORMAL  . EP IMPLANTABLE DEVICE N/A 12/17/2015   Procedure: Loop Recorder Insertion;  Surgeon: [name], MD;  Location: [facility];  Service: Cardiovascular;  Laterality: N/A;  . LEFT HEART CATH AND CORONARY ANGIOGRAPHY N/A 05/25/2017   Procedure: LEFT HEART CATH AND CORONARY ANGIOGRAPHY;  Surgeon: Sherren Mocha, MD;  Location: [facility];  Service: Cardiovascular;  Laterality: N/A;  . LOOP RECORDER IMPLANT    . TEE WITHOUT CARDIOVERSION N/A 12/17/2015   Procedure: TRANSESOPHAGEAL ECHOCARDIOGRAM (TEE);  Surgeon: Lelon Perla, MD;  Location: [facility] ENDOSCOPY;  Service: Cardiovascular;  Laterality: N/A;  Pt also needs a LOOP  . TRANSTHORACIC ECHOCARDIOGRAM  2008   SHOWED LEFT VENTRICULAR HYPERTROPHY AND MILD AORTIC STENOSIS    There were no vitals filed for this visit.  Subjective Assessment - 09/16/17 1454    Subjective  Pt. reports feelings whoozy and tired today. Pt. reports being sore and tired after last tx. Pt. reports high BP this morning but took her medicine which helped. Pt. reports being SOB today not  sure why.    Currently in Pain?  No/denies    Pain Score  0-No pain                      [facility] Adult PT Treatment/Exercise - 09/16/17 0001      Ambulation/Gait   Gait Comments  working on gait with continuous movement, increased stride length and increased cadence      High Level Balance   High Level Balance Comments  side stepping up and down incline outside with rail as light HHA, tandem walking on a line with CGA, gait with HHA increased cadence on multi-surfaces and up and down steps, four  square ex similar to test      Knee/Hip Exercises: Aerobic   Nustep  L5 x 6 mins      Knee/Hip Exercises: Seated   Ball Squeeze  2x20    Other Seated Knee/Hip Exercises  hip ABD with red tband 3x10    Sit to Sand  3 sets;10 reps;without UE support holding non-wt ball                PT Short Term Goals - 09/02/17 1435      PT SHORT TERM GOAL #1   Title  independent with her home HEP    Time  2    Period  Weeks    Status  On-going        PT Long Term Goals - 09/16/17 1544      PT LONG TERM GOAL #4   Title  be able to do a sit to stand without assistance and without losing balance for functional use    Time  8    Period  Weeks    Status  On-going            Plan - 09/16/17 1541    Clinical Impression Statement  Pt. needs constant verbal cues and tactile cues to increase stride and cedence when walking and to pick up LLE when having to change surfaces or surface heights. Pt. can increase stride and cadence if pulled along with HHA. Pt. did well with surface changes but does slow down and start to shuffle - no stumbles or reports of dizziness.     Rehab Potential  Good    PT Frequency  2x / week    PT Duration  8 weeks    PT Treatment/Interventions  Stair training;Gait training;Functional mobility training;Therapeutic activities;Therapeutic exercise;Balance training;Patient/family education    PT Next Visit Plan  continue strengthening/gait and balance    Consulted and Agree with Plan of Care  Patient       Patient will benefit from skilled therapeutic intervention in order to improve the following deficits and impairments:  Abnormal gait, Decreased strength, Decreased balance, Difficulty walking, Decreased activity tolerance, Decreased coordination, Decreased mobility  Visit Diagnosis: Balance problem  Muscle weakness (generalized)  Difficulty in walking, not elsewhere classified     Problem List Patient Active Problem List   Diagnosis Date Noted  .  CAD (coronary artery disease) 06/09/2017  . Abnormal nuclear cardiac imaging test 05/25/2017  . PVC's (premature ventricular contractions) 05/17/2017  . Anxiety 12/29/2016  . Vascular parkinsonism ([place]) 06/29/2016  . Gait disorder 04/07/2016  . Carotid stenosis, asymptomatic, bilateral 02/27/2016  . Left-sided weakness 12/28/2015  . Headache 12/28/2015  . Adjustment reaction with anxiety 12/20/2015  . Left hemiparesis ([place])   . History of CVA with residual deficit   . Prediabetes   .  SVT (supraventricular tachycardia) ([place]) 12/14/2015  . Stroke-like symptom 12/13/2015  . Heart palpitations 09/06/2012  . Hypertension   . Hyperlipidemia   . Hypothyroidism   . Vitamin D deficiency   . Palpitations   . History of dizziness   . LVH (left ventricular hypertrophy)   . Aortic stenosis   . Mitral regurgitation   . SOB (shortness of breath)   . Difficulty walking     Juliann Pulse SPT 09/16/2017, 3:47 PM  [place], [place], [address] Phone: [phone]   Fax:  [phone]  Name: GRACIANA SESSA MRN: [id number] Date of Birth: [birth year]

## 2017-09-17 ENCOUNTER — Encounter: Payer: Self-pay | Admitting: *Deleted

## 2017-09-17 ENCOUNTER — Telehealth: Payer: Self-pay | Admitting: *Deleted

## 2017-09-17 NOTE — Telephone Encounter (Signed)
Left message for patient to call and schedule [facility] appointment (possible genetic testing) with Dr. Debara Pickett

## 2017-09-21 ENCOUNTER — Ambulatory Visit: Payer: PPO | Admitting: Physical Therapy

## 2017-09-21 DIAGNOSIS — R2689 Other abnormalities of gait and mobility: Secondary | ICD-10-CM | POA: Diagnosis not present

## 2017-09-21 DIAGNOSIS — R262 Difficulty in walking, not elsewhere classified: Secondary | ICD-10-CM

## 2017-09-21 DIAGNOSIS — M6281 Muscle weakness (generalized): Secondary | ICD-10-CM

## 2017-09-21 NOTE — Therapy (Signed)
[place], [place], [address] Phone: [phone]   Fax:  [phone]  Physical Therapy Treatment  Patient Details  Name: Stacey Ramirez MRN: [id number] Date of Birth: 03-Jul-1941 Referring Provider: Deland Pretty   Encounter Date: 09/21/2017  PT End of Session - 09/21/17 1239    Visit Number  8    Date for PT Re-Evaluation  10/22/17    PT Start Time  1220    PT Stop Time  1308    PT Time Calculation (min)  48 min       Past Medical History:  Diagnosis Date  . Adjustment reaction with anxiety 12/20/2015  . Aortic stenosis   . CAD (coronary artery disease) 06/09/2017   Nuc study 10/18: EF 69, inferolateral perfusion defect-possible small infarct with peri-infarct ischemia; intermediate risk // LHC 10/18: pLAD 25, pLCx 50, pRCA 25 >> med Rx  . Carotid stenosis, asymptomatic, bilateral 02/27/2016   Carotid US 5/17: Bilateral ICA 1-39  . Cerebrovascular accident (stroke) ([place])    a. 12/2015 - cryptogenic.  S/P MDT Linq.  . Difficulty walking   . Essential hypertension   . Gait disorder 04/07/2016  . Heart palpitations 09/06/2012  . History of CVA with residual deficit 12/14/2015  . History of dizziness   . History of echocardiogram    Echo 10/18: Vigorous LVF, EF 65-70, normal wall motion, grade 1 diastolic dysfunction, GLS -21.1%, mild RAE  . History of nuclear stress test    Nuclear stress test 10/18: EF 69, inf-lat defect c/w poss infarct with peri-infarct ischemia; Intermediate Risk  . Hyperlipidemia   . Hypothyroidism   . Left hemiparesis ([place])   . LVH (left ventricular hypertrophy)   . Mitral regurgitation    a. 12/2015 Echo: EF 60-65%, mild focal basal hypertrophy. No rwma, triv AI, mild MR, mildly dil LA w/o thrombus. No RA thrombus. No PFO.  Marland Kitchen Palpitations   . Prediabetes   . Stroke-like symptom 12/13/2015  . SVT (supraventricular tachycardia) ([place]) 12/14/2015  . Vascular parkinsonism ([place])  06/29/2016  . Vitamin D deficiency     Past Surgical History:  Procedure Laterality Date  . CARDIOVASCULAR STRESS TEST  2002   NORMAL  . EP IMPLANTABLE DEVICE N/A 12/17/2015   Procedure: Loop Recorder Insertion;  Surgeon: [name], MD;  Location: [facility];  Service: Cardiovascular;  Laterality: N/A;  . LEFT HEART CATH AND CORONARY ANGIOGRAPHY N/A 05/25/2017   Procedure: LEFT HEART CATH AND CORONARY ANGIOGRAPHY;  Surgeon: Sherren Mocha, MD;  Location: [facility];  Service: Cardiovascular;  Laterality: N/A;  . LOOP RECORDER IMPLANT    . TEE WITHOUT CARDIOVERSION N/A 12/17/2015   Procedure: TRANSESOPHAGEAL ECHOCARDIOGRAM (TEE);  Surgeon: Lelon Perla, MD;  Location: [facility] ENDOSCOPY;  Service: Cardiovascular;  Laterality: N/A;  Pt also needs a LOOP  . TRANSTHORACIC ECHOCARDIOGRAM  2008   SHOWED LEFT VENTRICULAR HYPERTROPHY AND MILD AORTIC STENOSIS    There were no vitals filed for this visit.  Subjective Assessment - 09/21/17 1223    Subjective  good and bad days    Currently in Pain?  No/denies                      [facility] Adult PT Treatment/Exercise - 09/21/17 0001      Ambulation/Gait   Gait Comments  working on gait with ball toss and head turns      High Level Balance  High Level Balance Comments  dynamic airex and tampoline work      Knee/Hip Exercises: Aerobic   Nustep  L5 x 6 mins      Knee/Hip Exercises: Machines for Strengthening   Cybex Knee Extension  10# 2 sets 10    Cybex Knee Flexion  20# 2 sets 10    Cybex Leg Press  20# 2 sets 10               PT Short Term Goals - 09/02/17 1435      PT SHORT TERM GOAL #1   Title  independent with her home HEP    Time  2    Period  Weeks    Status  On-going        PT Long Term Goals - 09/16/17 1544      PT LONG TERM GOAL #4   Title  be able to do a sit to stand without assistance and without losing balance for functional use    Time  8    Period  Weeks    Status   On-going            Plan - 09/21/17 1259    Clinical Impression Statement  improved step length today and righting reaction to LOB    PT Treatment/Interventions  Stair training;Gait training;Functional mobility training;Therapeutic activities;Therapeutic exercise;Balance training;Patient/family education    PT Next Visit Plan  continue strengthening/gait and balance, CHECK BERG       Patient will benefit from skilled therapeutic intervention in order to improve the following deficits and impairments:  Abnormal gait, Decreased strength, Decreased balance, Difficulty walking, Decreased activity tolerance, Decreased coordination, Decreased mobility  Visit Diagnosis: Muscle weakness (generalized)  Difficulty in walking, not elsewhere classified  Balance problem     Problem List Patient Active Problem List   Diagnosis Date Noted  . CAD (coronary artery disease) 06/09/2017  . Abnormal nuclear cardiac imaging test 05/25/2017  . PVC's (premature ventricular contractions) 05/17/2017  . Anxiety 12/29/2016  . Vascular parkinsonism ([place]) 06/29/2016  . Gait disorder 04/07/2016  . Carotid stenosis, asymptomatic, bilateral 02/27/2016  . Left-sided weakness 12/28/2015  . Headache 12/28/2015  . Adjustment reaction with anxiety 12/20/2015  . Left hemiparesis ([place])   . History of CVA with residual deficit   . Prediabetes   . SVT (supraventricular tachycardia) ([place]) 12/14/2015  . Stroke-like symptom 12/13/2015  . Heart palpitations 09/06/2012  . Hypertension   . Hyperlipidemia   . Hypothyroidism   . Vitamin D deficiency   . Palpitations   . History of dizziness   . LVH (left ventricular hypertrophy)   . Aortic stenosis   . Mitral regurgitation   . SOB (shortness of breath)   . Difficulty walking     Akari Crysler,ANGIE PTA 09/21/2017, 1:00 PM  [address] [place], [place], [address] Phone: [phone]    Fax:  [phone]  Name: Stacey Ramirez MRN: [id number] Date of Birth: 09-Feb-1941

## 2017-09-23 ENCOUNTER — Ambulatory Visit: Payer: PPO | Admitting: Physical Therapy

## 2017-09-23 DIAGNOSIS — R2689 Other abnormalities of gait and mobility: Secondary | ICD-10-CM

## 2017-09-23 DIAGNOSIS — R262 Difficulty in walking, not elsewhere classified: Secondary | ICD-10-CM

## 2017-09-23 DIAGNOSIS — M6281 Muscle weakness (generalized): Secondary | ICD-10-CM

## 2017-09-23 NOTE — Therapy (Signed)
[place], [place], [address] Phone: [phone]   Fax:  [phone]  Physical Therapy Treatment  Patient Details  Name: Stacey Ramirez MRN: [id number] Date of Birth: Apr 09, 1941 Referring Provider: Deland Pretty   Encounter Date: 09/23/2017  PT End of Session - 09/23/17 1532    Visit Number  9    Date for PT Re-Evaluation  10/22/17    PT Start Time  1440    PT Stop Time  1533    PT Time Calculation (min)  53 min       Past Medical History:  Diagnosis Date  . Adjustment reaction with anxiety 12/20/2015  . Aortic stenosis   . CAD (coronary artery disease) 06/09/2017   Nuc study 10/18: EF 69, inferolateral perfusion defect-possible small infarct with peri-infarct ischemia; intermediate risk // LHC 10/18: pLAD 25, pLCx 50, pRCA 25 >> med Rx  . Carotid stenosis, asymptomatic, bilateral 02/27/2016   Carotid US 5/17: Bilateral ICA 1-39  . Cerebrovascular accident (stroke) ([place])    a. 12/2015 - cryptogenic.  S/P MDT Linq.  . Difficulty walking   . Essential hypertension   . Gait disorder 04/07/2016  . Heart palpitations 09/06/2012  . History of CVA with residual deficit 12/14/2015  . History of dizziness   . History of echocardiogram    Echo 10/18: Vigorous LVF, EF 65-70, normal wall motion, grade 1 diastolic dysfunction, GLS -21.1%, mild RAE  . History of nuclear stress test    Nuclear stress test 10/18: EF 69, inf-lat defect c/w poss infarct with peri-infarct ischemia; Intermediate Risk  . Hyperlipidemia   . Hypothyroidism   . Left hemiparesis ([place])   . LVH (left ventricular hypertrophy)   . Mitral regurgitation    a. 12/2015 Echo: EF 60-65%, mild focal basal hypertrophy. No rwma, triv AI, mild MR, mildly dil LA w/o thrombus. No RA thrombus. No PFO.  Marland Kitchen Palpitations   . Prediabetes   . Stroke-like symptom 12/13/2015  . SVT (supraventricular tachycardia) ([place]) 12/14/2015  . Vascular parkinsonism ([place])  06/29/2016  . Vitamin D deficiency     Past Surgical History:  Procedure Laterality Date  . CARDIOVASCULAR STRESS TEST  2002   NORMAL  . EP IMPLANTABLE DEVICE N/A 12/17/2015   Procedure: Loop Recorder Insertion;  Surgeon: [name], MD;  Location: [facility];  Service: Cardiovascular;  Laterality: N/A;  . LEFT HEART CATH AND CORONARY ANGIOGRAPHY N/A 05/25/2017   Procedure: LEFT HEART CATH AND CORONARY ANGIOGRAPHY;  Surgeon: Sherren Mocha, MD;  Location: [facility];  Service: Cardiovascular;  Laterality: N/A;  . LOOP RECORDER IMPLANT    . TEE WITHOUT CARDIOVERSION N/A 12/17/2015   Procedure: TRANSESOPHAGEAL ECHOCARDIOGRAM (TEE);  Surgeon: Lelon Perla, MD;  Location: [facility] ENDOSCOPY;  Service: Cardiovascular;  Laterality: N/A;  Pt also needs a LOOP  . TRANSTHORACIC ECHOCARDIOGRAM  2008   SHOWED LEFT VENTRICULAR HYPERTROPHY AND MILD AORTIC STENOSIS    There were no vitals filed for this visit.  Subjective Assessment - 09/23/17 1448    Subjective  doing good today    Currently in Pain?  No/denies                      [facility] Adult PT Treatment/Exercise - 09/23/17 0001      Ambulation/Gait   Gait Comments  walk around the bldg working on transitioning surfaces,turns and incline/decline- did very well but difficulty on incline  Standardized Balance Assessment   Standardized Balance Assessment  Berg Balance Test      Berg Balance Test   Sit to Stand  Able to stand without using hands and stabilize independently    Standing Unsupported  Able to stand safely 2 minutes    Sitting with Back Unsupported but Feet Supported on Floor or Stool  Able to sit safely and securely 2 minutes    Stand to Sit  Sits safely with minimal use of hands    Transfers  Able to transfer safely, definite need of hands    Standing Unsupported with Eyes Closed  Able to stand 10 seconds with supervision    Standing Ubsupported with Feet Together  Needs help to attain  position but able to stand for 30 seconds with feet together    From Standing, Reach Forward with Outstretched Arm  Can reach forward >5 cm safely (2")    From Standing Position, Pick up Object from Floor  Able to pick up shoe, needs supervision    From Standing Position, Turn to Look Behind Over each Shoulder  Looks behind from both sides and weight shifts well    Turn 360 Degrees  Able to turn 360 degrees safely one side only in 4 seconds or less    Standing Unsupported, Alternately Place Feet on Step/Stool  Able to complete 4 steps without aid or supervision    Standing Unsupported, One Foot in [employer] balance while stepping or standing    Standing on One Leg  Tries to lift leg/unable to hold 3 seconds but remains standing independently    Total Score  38      High Level Balance   High Level Balance Comments  ball toss and kick      Knee/Hip Exercises: Aerobic   Nustep  L5 x 6 mins      Knee/Hip Exercises: Machines for Strengthening   Cybex Knee Extension  10# 2 sets 10    Cybex Knee Flexion  20# 2 sets 10      Knee/Hip Exercises: Standing   Other Standing Knee Exercises   alt step tap 6 inch 20 times each 6 inch step up 10 each               PT Short Term Goals - 09/02/17 1435      PT SHORT TERM GOAL #1   Title  independent with her home HEP    Time  2    Period  Weeks    Status  On-going        PT Long Term Goals - 09/23/17 1532      PT LONG TERM GOAL #1   Title  decrease TUG time to 14 seconds for functional gait and safety    Baseline  17 sec    Status  Partially Met      PT LONG TERM GOAL #2   Title  increase Berg balance score to 46/56 for safety    Baseline  38/56    Status  On-going      PT LONG TERM GOAL #3   Title  increase LE strength (particularly ABD and ext.) to 4/5 for functional gait and safety    Status  On-going      PT LONG TERM GOAL #4   Title  be able to do a sit to stand without assistance and without losing balance for  functional use    Status  Achieved  Plan - 09/23/17 1534    Clinical Impression Statement  improved,consistant stride through entire as well as improved righting reaction. difficulty with inclines and has to cue herself thru entire walk. progressing goals. discussed gym program transition with pt and spouse to get them prepared and stressed need for cont. ex    PT Treatment/Interventions  Stair training;Gait training;Functional mobility training;Therapeutic activities;Therapeutic exercise;Balance training;Patient/family education    PT Next Visit Plan  continue strengthening/gait and balance,        Patient will benefit from skilled therapeutic intervention in order to improve the following deficits and impairments:  Abnormal gait, Decreased strength, Decreased balance, Difficulty walking, Decreased activity tolerance, Decreased coordination, Decreased mobility  Visit Diagnosis: Muscle weakness (generalized)  Difficulty in walking, not elsewhere classified  Balance problem     Problem List Patient Active Problem List   Diagnosis Date Noted  . CAD (coronary artery disease) 06/09/2017  . Abnormal nuclear cardiac imaging test 05/25/2017  . PVC's (premature ventricular contractions) 05/17/2017  . Anxiety 12/29/2016  . Vascular parkinsonism ([place]) 06/29/2016  . Gait disorder 04/07/2016  . Carotid stenosis, asymptomatic, bilateral 02/27/2016  . Left-sided weakness 12/28/2015  . Headache 12/28/2015  . Adjustment reaction with anxiety 12/20/2015  . Left hemiparesis ([place])   . History of CVA with residual deficit   . Prediabetes   . SVT (supraventricular tachycardia) ([place]) 12/14/2015  . Stroke-like symptom 12/13/2015  . Heart palpitations 09/06/2012  . Hypertension   . Hyperlipidemia   . Hypothyroidism   . Vitamin D deficiency   . Palpitations   . History of dizziness   . LVH (left ventricular hypertrophy)   . Aortic stenosis   . Mitral regurgitation   . SOB  (shortness of breath)   . Difficulty walking     PAYSEUR,ANGIE PTA 09/23/2017, 3:38 PM  [place] [address] [place], [place], [address] Phone: [phone]   Fax:  [phone]  Name: Stacey Ramirez MRN: [id number] Date of Birth: July 04, 1941

## 2017-09-27 DIAGNOSIS — I10 Essential (primary) hypertension: Secondary | ICD-10-CM | POA: Diagnosis not present

## 2017-09-27 DIAGNOSIS — R42 Dizziness and giddiness: Secondary | ICD-10-CM | POA: Diagnosis not present

## 2017-09-27 DIAGNOSIS — R739 Hyperglycemia, unspecified: Secondary | ICD-10-CM | POA: Diagnosis not present

## 2017-09-27 DIAGNOSIS — R0781 Pleurodynia: Secondary | ICD-10-CM | POA: Diagnosis not present

## 2017-09-27 DIAGNOSIS — J309 Allergic rhinitis, unspecified: Secondary | ICD-10-CM | POA: Diagnosis not present

## 2017-09-28 ENCOUNTER — Ambulatory Visit: Payer: PPO | Admitting: Physical Therapy

## 2017-09-28 DIAGNOSIS — M6281 Muscle weakness (generalized): Secondary | ICD-10-CM

## 2017-09-28 DIAGNOSIS — R262 Difficulty in walking, not elsewhere classified: Secondary | ICD-10-CM

## 2017-09-28 DIAGNOSIS — R2689 Other abnormalities of gait and mobility: Secondary | ICD-10-CM | POA: Diagnosis not present

## 2017-09-28 NOTE — Therapy (Signed)
[place], [place], [address] Phone: [phone]   Fax:  [phone]  Physical Therapy Treatment  Patient Details  Name: Stacey Ramirez MRN: [id number] Date of Birth: 11/02/1940 Referring Provider: Deland Pretty   Encounter Date: 09/28/2017  PT End of Session - 09/28/17 1259    Visit Number  10    Date for PT Re-Evaluation  10/22/17    PT Start Time  1230    PT Stop Time  1315    PT Time Calculation (min)  45 min       Past Medical History:  Diagnosis Date  . Adjustment reaction with anxiety 12/20/2015  . Aortic stenosis   . CAD (coronary artery disease) 06/09/2017   Nuc study 10/18: EF 69, inferolateral perfusion defect-possible small infarct with peri-infarct ischemia; intermediate risk // LHC 10/18: pLAD 25, pLCx 50, pRCA 25 >> med Rx  . Carotid stenosis, asymptomatic, bilateral 02/27/2016   Carotid US 5/17: Bilateral ICA 1-39  . Cerebrovascular accident (stroke) ([place])    a. 12/2015 - cryptogenic.  S/P MDT Linq.  . Difficulty walking   . Essential hypertension   . Gait disorder 04/07/2016  . Heart palpitations 09/06/2012  . History of CVA with residual deficit 12/14/2015  . History of dizziness   . History of echocardiogram    Echo 10/18: Vigorous LVF, EF 65-70, normal wall motion, grade 1 diastolic dysfunction, GLS -21.1%, mild RAE  . History of nuclear stress test    Nuclear stress test 10/18: EF 69, inf-lat defect c/w poss infarct with peri-infarct ischemia; Intermediate Risk  . Hyperlipidemia   . Hypothyroidism   . Left hemiparesis ([place])   . LVH (left ventricular hypertrophy)   . Mitral regurgitation    a. 12/2015 Echo: EF 60-65%, mild focal basal hypertrophy. No rwma, triv AI, mild MR, mildly dil LA w/o thrombus. No RA thrombus. No PFO.  Marland Kitchen Palpitations   . Prediabetes   . Stroke-like symptom 12/13/2015  . SVT (supraventricular tachycardia) ([place]) 12/14/2015  . Vascular parkinsonism ([place])  06/29/2016  . Vitamin D deficiency     Past Surgical History:  Procedure Laterality Date  . CARDIOVASCULAR STRESS TEST  2002   NORMAL  . EP IMPLANTABLE DEVICE N/A 12/17/2015   Procedure: Loop Recorder Insertion;  Surgeon: [name], MD;  Location: [place] [facility];  Service: Cardiovascular;  Laterality: N/A;  . LEFT HEART CATH AND CORONARY ANGIOGRAPHY N/A 05/25/2017   Procedure: LEFT HEART CATH AND CORONARY ANGIOGRAPHY;  Surgeon: Sherren Mocha, MD;  Location: [facility];  Service: Cardiovascular;  Laterality: N/A;  . LOOP RECORDER IMPLANT    . TEE WITHOUT CARDIOVERSION N/A 12/17/2015   Procedure: TRANSESOPHAGEAL ECHOCARDIOGRAM (TEE);  Surgeon: Lelon Perla, MD;  Location[facility] ENDOSCOPY;  Service: Cardiovascular;  Laterality: N/A;  Pt also needs a LOOP  . TRANSTHORACIC ECHOCARDIOGRAM  2008   SHOWED LEFT VENTRICULAR HYPERTROPHY AND MILD AORTIC STENOSIS    There were no vitals filed for this visit.  Subjective Assessment - 09/28/17 1233    Subjective  took a zyrtec this morning and its made me woozy    Currently in Pain?  No/denies                      [facility] Adult PT Treatment/Exercise - 09/28/17 0001      High Level Balance   High Level Balance Activities  Marching forwards;Marching backwards;Side stepping 3# HHA  Knee/Hip Exercises: Aerobic   Nustep  L5 x 7 mins      Knee/Hip Exercises: Machines for Strengthening   Cybex Knee Extension  10# 3 sets 10    Cybex Knee Flexion  20# 3 sets 10    Cybex Leg Press  20# 3 sets 10      Knee/Hip Exercises: Standing   Other Standing Knee Exercises  alt hip flex,ext and abd 20 3# HHA      Knee/Hip Exercises: Seated   Other Seated Knee/Hip Exercises  3# alt 6 inch step tap 20 2 sets    Abduction/Adduction   Both;15 reps;Weights 6 inch step tap    Abd/Adduction Weights  3 lbs.               PT Short Term Goals - 09/02/17 1435      PT SHORT TERM GOAL #1   Title  independent with her home  HEP    Time  2    Period  Weeks    Status  On-going        PT Long Term Goals - 09/23/17 1532      PT LONG TERM GOAL #1   Title  decrease TUG time to 14 seconds for functional gait and safety    Baseline  17 sec    Status  Partially Met      PT LONG TERM GOAL #2   Title  increase Berg balance score to 46/56 for safety    Baseline  38/56    Status  On-going      PT LONG TERM GOAL #3   Title  increase LE strength (particularly ABD and ext.) to 4/5 for functional gait and safety    Status  On-going      PT LONG TERM GOAL #4   Title  be able to do a sit to stand without assistance and without losing balance for functional use    Status  Achieved            Plan - 09/28/17 1300    Clinical Impression Statement  focus session more on strengthening today as pt was more dizzy an dless steady on feet d/t taking zyrtec.    PT Treatment/Interventions  Stair training;Gait training;Functional mobility training;Therapeutic activities;Therapeutic exercise;Balance training;Patient/family education    PT Next Visit Plan  continue strengthening/gait and balance,        Patient will benefit from skilled therapeutic intervention in order to improve the following deficits and impairments:  Abnormal gait, Decreased strength, Decreased balance, Difficulty walking, Decreased activity tolerance, Decreased coordination, Decreased mobility  Visit Diagnosis: Muscle weakness (generalized)  Difficulty in walking, not elsewhere classified  Balance problem     Problem List Patient Active Problem List   Diagnosis Date Noted  . CAD (coronary artery disease) 06/09/2017  . Abnormal nuclear cardiac imaging test 05/25/2017  . PVC's (premature ventricular contractions) 05/17/2017  . Anxiety 12/29/2016  . Vascular parkinsonism ([place]) 06/29/2016  . Gait disorder 04/07/2016  . Carotid stenosis, asymptomatic, bilateral 02/27/2016  . Left-sided weakness 12/28/2015  . Headache 12/28/2015  .  Adjustment reaction with anxiety 12/20/2015  . Left hemiparesis ([place])   . History of CVA with residual deficit   . Prediabetes   . SVT (supraventricular tachycardia) ([place]) 12/14/2015  . Stroke-like symptom 12/13/2015  . Heart palpitations 09/06/2012  . Hypertension   . Hyperlipidemia   . Hypothyroidism   . Vitamin D deficiency   . Palpitations   . History of dizziness   .  LVH (left ventricular hypertrophy)   . Aortic stenosis   . Mitral regurgitation   . SOB (shortness of breath)   . Difficulty walking     PAYSEUR,ANGIE  PTA 09/28/2017, 1:01 PM  [address] [place], [place], [address] Phone: [phone]   Fax:  [phone]  Name: Stacey Ramirez MRN: [id number] Date of Birth: [birth year]

## 2017-09-30 ENCOUNTER — Ambulatory Visit: Payer: PPO | Admitting: Physical Therapy

## 2017-09-30 ENCOUNTER — Encounter: Payer: Self-pay | Admitting: Physical Therapy

## 2017-09-30 DIAGNOSIS — R262 Difficulty in walking, not elsewhere classified: Secondary | ICD-10-CM

## 2017-09-30 DIAGNOSIS — R2689 Other abnormalities of gait and mobility: Secondary | ICD-10-CM

## 2017-09-30 DIAGNOSIS — F419 Anxiety disorder, unspecified: Secondary | ICD-10-CM

## 2017-09-30 DIAGNOSIS — M6281 Muscle weakness (generalized): Secondary | ICD-10-CM

## 2017-09-30 NOTE — Therapy (Signed)
[place], [place], [address] Phone: [phone]   Fax:  [phone]  Physical Therapy Treatment  Patient Details  Name: Stacey Ramirez MRN: [id number] Date of Birth: [birth year] Referring Provider: Deland Pretty   Encounter Date: 09/30/2017  PT End of Session - 09/30/17 1343    Visit Number  11    Date for PT Re-Evaluation  10/22/17    PT Start Time  1301    PT Stop Time  1345    PT Time Calculation (min)  44 min    Activity Tolerance  Patient tolerated treatment well    Behavior During Therapy  [facility] for tasks assessed/performed       Past Medical History:  Diagnosis Date  . Adjustment reaction with anxiety 12/20/2015  . Aortic stenosis   . CAD (coronary artery disease) 06/09/2017   Nuc study 10/18: EF 69, inferolateral perfusion defect-possible small infarct with peri-infarct ischemia; intermediate risk // LHC 10/18: pLAD 25, pLCx 50, pRCA 25 >> med Rx  . Carotid stenosis, asymptomatic, bilateral 02/27/2016   Carotid US 5/17: Bilateral ICA 1-39  . Cerebrovascular accident (stroke) ([place])    a. 12/2015 - cryptogenic.  S/P MDT Linq.  . Difficulty walking   . Essential hypertension   . Gait disorder 04/07/2016  . Heart palpitations 09/06/2012  . History of CVA with residual deficit 12/14/2015  . History of dizziness   . History of echocardiogram    Echo 10/18: Vigorous LVF, EF 65-70, normal wall motion, grade 1 diastolic dysfunction, GLS -21.1%, mild RAE  . History of nuclear stress test    Nuclear stress test 10/18: EF 69, inf-lat defect c/w poss infarct with peri-infarct ischemia; Intermediate Risk  . Hyperlipidemia   . Hypothyroidism   . Left hemiparesis ([place])   . LVH (left ventricular hypertrophy)   . Mitral regurgitation    a. 12/2015 Echo: EF 60-65%, mild focal basal hypertrophy. No rwma, triv AI, mild MR, mildly dil LA w/o thrombus. No RA thrombus. No PFO.  Marland Kitchen Palpitations   . Prediabetes    . Stroke-like symptom 12/13/2015  . SVT (supraventricular tachycardia) ([place]) 12/14/2015  . Vascular parkinsonism ([place]) 06/29/2016  . Vitamin D deficiency     Past Surgical History:  Procedure Laterality Date  . CARDIOVASCULAR STRESS TEST  2002   NORMAL  . EP IMPLANTABLE DEVICE N/A 12/17/2015   Procedure: Loop Recorder Insertion;  Surgeon: [name], MD;  Location: [facility];  Service: Cardiovascular;  Laterality: N/A;  . LEFT HEART CATH AND CORONARY ANGIOGRAPHY N/A 05/25/2017   Procedure: LEFT HEART CATH AND CORONARY ANGIOGRAPHY;  Surgeon: Sherren Mocha, MD;  Location: [facility];  Service: Cardiovascular;  Laterality: N/A;  . LOOP RECORDER IMPLANT    . TEE WITHOUT CARDIOVERSION N/A 12/17/2015   Procedure: TRANSESOPHAGEAL ECHOCARDIOGRAM (TEE);  Surgeon: Lelon Perla, MD;  [facility]: [facility] END[facility];  Service: Cardiovascular;  Laterality: N/A;  Pt also needs a LOOP  . TRANSTHORACIC ECHOCARDIOGRAM  2008   SHOWED LEFT VENTRICULAR HYPERTROPHY AND MILD AORTIC STENOSIS    There were no vitals filed for this visit.  Subjective Assessment - 09/30/17 1305    Subjective  "Im ok, I sat tired dow cause it is so hard to do anything"    Currently in Pain?  No/denies    Pain Score  0-No pain  [facility] Adult PT Treatment/Exercise - 09/30/17 0001      High Level Balance   High Level Balance Activities  Side stepping;Backward walking;Marching forwards;Marching backwards;Negotiating over obstacles    High Level Balance Comments  side stepping over BOSU, CGA & HHA as needed.       Knee/Hip Exercises: Aerobic   Nustep  L5 x 7 mins      Knee/Hip Exercises: Standing   Other Standing Knee Exercises  standing march 3lb 2x10     Other Standing Knee Exercises  3lb curr weights stde step over half foam roll, forward and backward step over foam roll; Tband rows red tband 2x10       Knee/Hip Exercises: Seated   Other Seated Knee/Hip Exercises  3# alt 6  inch step tap 20 2 sets               PT Short Term Goals - 09/02/17 1435      PT SHORT TERM GOAL #1   Title  independent with her home HEP    Time  2    Period  Weeks    Status  On-going        PT Long Term Goals - 09/23/17 1532      PT LONG TERM GOAL #1   Title  decrease TUG time to 14 seconds for functional gait and safety    Baseline  17 sec    Status  Partially Met      PT LONG TERM GOAL #2   Title  increase Berg balance score to 46/56 for safety    Baseline  38/56    Status  On-going      PT LONG TERM GOAL #3   Title  increase LE strength (particularly ABD and ext.) to 4/5 for functional gait and safety    Status  On-going      PT LONG TERM GOAL #4   Title  be able to do a sit to stand without assistance and without losing balance for functional use    Status  Achieved            Plan - 09/30/17 1344    Clinical Impression Statement  no dizziness reported today. Today's interventions focused on balance, CGA assist needed when stepping over objects. Clearance issues at times when stepping over objects. Pt is very hyper verbal throughout treatment. often time diagnosing when she make a mistake. She improves when told to simply fix her mistakes.     PT Frequency  2x / week    PT Duration  8 weeks    PT Treatment/Interventions  Stair training;Gait training;Functional mobility training;Therapeutic activities;Therapeutic exercise;Balance training;Patient/family education    PT Next Visit Plan  continue strengthening/gait and balance,        Patient will benefit from skilled therapeutic intervention in order to improve the following deficits and impairments:  Abnormal gait, Decreased strength, Decreased balance, Difficulty walking, Decreased activity tolerance, Decreased coordination, Decreased mobility  Visit Diagnosis: Muscle weakness (generalized)  Difficulty in walking, not elsewhere classified  Anxiety  Balance problem     Problem  List Patient Active Problem List   Diagnosis Date Noted  . CAD (coronary artery disease) 06/09/2017  . Abnormal nuclear cardiac imaging test 05/25/2017  . PVC's (premature ventricular contractions) 05/17/2017  . Anxiety 12/29/2016  . Vascular parkinsonism ([place]) 06/29/2016  . Gait disorder 04/07/2016  . Carotid stenosis, asymptomatic, bilateral 02/27/2016  . Left-sided weakness 12/28/2015  . Headache 12/28/2015  . Adjustment reaction with  anxiety 12/20/2015  . Left hemiparesis ([place])   . History of CVA with residual deficit   . Prediabetes   . SVT (supraventricular tachycardia) ([place]) 12/14/2015  . Stroke-like symptom 12/13/2015  . Heart palpitations 09/06/2012  . Hypertension   . Hyperlipidemia   . Hypothyroidism   . Vitamin D deficiency   . Palpitations   . History of dizziness   . LVH (left ventricular hypertrophy)   . Aortic stenosis   . Mitral regurgitation   . SOB (shortness of breath)   . Difficulty walking     Scot Jun, PTA 09/30/2017, 1:50 PM  [place], [place], [address] Phone: [phone]   Fax:  [phone]  Name: Stacey Ramirez MRN: [id number] Date of Birth: [birth year]

## 2017-10-01 LAB — CUP PACEART REMOTE DEVICE CHECK
Date Time Interrogation Session: 20190206071220
MDC IDC PG IMPLANT DT: 20170516

## 2017-10-11 ENCOUNTER — Ambulatory Visit (INDEPENDENT_AMBULATORY_CARE_PROVIDER_SITE_OTHER): Payer: PPO | Admitting: *Deleted

## 2017-10-11 DIAGNOSIS — I639 Cerebral infarction, unspecified: Secondary | ICD-10-CM

## 2017-10-11 NOTE — Progress Notes (Signed)
Carelink Summary Report / Loop Recorder 

## 2017-10-12 ENCOUNTER — Ambulatory Visit: Payer: PPO | Attending: Internal Medicine | Admitting: Physical Therapy

## 2017-10-12 DIAGNOSIS — R262 Difficulty in walking, not elsewhere classified: Secondary | ICD-10-CM

## 2017-10-12 DIAGNOSIS — I63411 Cerebral infarction due to embolism of right middle cerebral artery: Secondary | ICD-10-CM | POA: Insufficient documentation

## 2017-10-12 DIAGNOSIS — R2689 Other abnormalities of gait and mobility: Secondary | ICD-10-CM | POA: Insufficient documentation

## 2017-10-12 DIAGNOSIS — F419 Anxiety disorder, unspecified: Secondary | ICD-10-CM | POA: Insufficient documentation

## 2017-10-12 DIAGNOSIS — M6281 Muscle weakness (generalized): Secondary | ICD-10-CM | POA: Insufficient documentation

## 2017-10-12 DIAGNOSIS — G8194 Hemiplegia, unspecified affecting left nondominant side: Secondary | ICD-10-CM | POA: Diagnosis not present

## 2017-10-12 NOTE — Therapy (Signed)
[place], [place], [address] Phone: [phone]   Fax:  [phone]  Physical Therapy Treatment  Patient Details  Name: Stacey Ramirez MRN: [id number] Date of Birth: July 30, 1941 Referring Provider: Deland Pretty   Encounter Date: 10/12/2017  PT End of Session - 10/12/17 1310    Visit Number  12    Date for PT Re-Evaluation  10/22/17    PT Start Time  1233    PT Stop Time  1315    PT Time Calculation (min)  42 min       Past Medical History:  Diagnosis Date  . Adjustment reaction with anxiety 12/20/2015  . Aortic stenosis   . CAD (coronary artery disease) 06/09/2017   Nuc study 10/18: EF 69, inferolateral perfusion defect-possible small infarct with peri-infarct ischemia; intermediate risk // LHC 10/18: pLAD 25, pLCx 50, pRCA 25 >> med Rx  . Carotid stenosis, asymptomatic, bilateral 02/27/2016   Carotid US 5/17: Bilateral ICA 1-39  . Cerebrovascular accident (stroke) ([place])    a. 12/2015 - cryptogenic.  S/P MDT Linq.  . Difficulty walking   . Essential hypertension   . Gait disorder 04/07/2016  . Heart palpitations 09/06/2012  . History of CVA with residual deficit 12/14/2015  . History of dizziness   . History of echocardiogram    Echo 10/18: Vigorous LVF, EF 65-70, normal wall motion, grade 1 diastolic dysfunction, GLS -21.1%, mild RAE  . History of nuclear stress test    Nuclear stress test 10/18: EF 69, inf-lat defect c/w poss infarct with peri-infarct ischemia; Intermediate Risk  . Hyperlipidemia   . Hypothyroidism   . Left hemiparesis ([place])   . LVH (left ventricular hypertrophy)   . Mitral regurgitation    a. 12/2015 Echo: EF 60-65%, mild focal basal hypertrophy. No rwma, triv AI, mild MR, mildly dil LA w/o thrombus. No RA thrombus. No PFO.  Marland Kitchen Palpitations   . Prediabetes   . Stroke-like symptom 12/13/2015  . SVT (supraventricular tachycardia) ([place]) 12/14/2015  . Vascular parkinsonism ([place])  06/29/2016  . Vitamin D deficiency     Past Surgical History:  Procedure Laterality Date  . CARDIOVASCULAR STRESS TEST  2002   NORMAL  . EP IMPLANTABLE DEVICE N/A 12/17/2015   Procedure: Loop Recorder Insertion;  Surgeon: [name], MD;  Location: [facility];  Service: Cardiovascular;  Laterality: N/A;  . LEFT HEART CATH AND CORONARY ANGIOGRAPHY N/A 05/25/2017   Procedure: LEFT HEART CATH AND CORONARY ANGIOGRAPHY;  Surgeon: Sherren Mocha, MD;  Location: [facility];  Service: Cardiovascular;  Laterality: N/A;  . LOOP RECORDER IMPLANT    . TEE WITHOUT CARDIOVERSION N/A 12/17/2015   Procedure: TRANSESOPHAGEAL ECHOCARDIOGRAM (TEE);  Surgeon: Lelon Perla, MD;  Location: [facility] ENDOSCOPY;  Service: Cardiovascular;  Laterality: N/A;  Pt also needs a LOOP  . TRANSTHORACIC ECHOCARDIOGRAM  2008   SHOWED LEFT VENTRICULAR HYPERTROPHY AND MILD AORTIC STENOSIS    There were no vitals filed for this visit.  Subjective Assessment - 10/12/17 1245    Subjective  went to beach and have not done much so legs not doing well    Currently in Pain?  No/denies                      [facility] Adult PT Treatment/Exercise - 10/12/17 0001      High Level Balance   High Level Balance Activities  Side stepping;Negotitating around obstacles;Negotiating over  obstacles over rolls and airex      Knee/Hip Exercises: Aerobic   Elliptical  3 min I 5 R 4    Nustep  L5 x 7 mins      Knee/Hip Exercises: Machines for Strengthening   Cybex Knee Extension  10# 3 sets 10    Cybex Knee Flexion  20# 3 sets 10      Knee/Hip Exercises: Standing   Other Standing Knee Exercises  airex marching,hip 3 way  min A posterior LOB and decreased ability to clear left leg    Other Standing Knee Exercises  airex obl twist with green tband               PT Short Term Goals - 09/02/17 1435      PT SHORT TERM GOAL #1   Title  independent with her home HEP    Time  2    Period  Weeks     Status  On-going        PT Long Term Goals - 10/12/17 1302      PT LONG TERM GOAL #1   Title  decrease TUG time to 14 seconds for functional gait and safety    Baseline  13 sec    Status  Achieved      PT LONG TERM GOAL #2   Title  increase Berg balance score to 46/56 for safety    Baseline  40/56    Status  Partially Met      PT LONG TERM GOAL #3   Title  increase LE strength (particularly ABD and ext.) to 4/5 for functional gait and safety    Status  On-going            Plan - 10/12/17 1310    Clinical Impression Statement  TUG goal met. BERG increase 2 pts. Pt did very well today with neg over and around oblects but very poor balance with alt kicks on airex- LOB and difficulty clearing left leg when return to mat    PT Treatment/Interventions  Stair training;Gait training;Functional mobility training;Therapeutic activities;Therapeutic exercise;Balance training;Patient/family education    PT Next Visit Plan  continue strengthening/gait and balance,        Patient will benefit from skilled therapeutic intervention in order to improve the following deficits and impairments:  Abnormal gait, Decreased strength, Decreased balance, Difficulty walking, Decreased activity tolerance, Decreased coordination, Decreased mobility  Visit Diagnosis: Muscle weakness (generalized)  Difficulty in walking, not elsewhere classified  Anxiety  Balance problem     Problem List Patient Active Problem List   Diagnosis Date Noted  . CAD (coronary artery disease) 06/09/2017  . Abnormal nuclear cardiac imaging test 05/25/2017  . PVC's (premature ventricular contractions) 05/17/2017  . Anxiety 12/29/2016  . Vascular parkinsonism ([place]) 06/29/2016  . Gait disorder 04/07/2016  . Carotid stenosis, asymptomatic, bilateral 02/27/2016  . Left-sided weakness 12/28/2015  . Headache 12/28/2015  . Adjustment reaction with anxiety 12/20/2015  . Left hemiparesis ([place])   . History of CVA with  residual deficit   . Prediabetes   . SVT (supraventricular tachycardia) ([place]) 12/14/2015  . Stroke-like symptom 12/13/2015  . Heart palpitations 09/06/2012  . Hypertension   . Hyperlipidemia   . Hypothyroidism   . Vitamin D deficiency   . Palpitations   . History of dizziness   . LVH (left ventricular hypertrophy)   . Aortic stenosis   . Mitral regurgitation   . SOB (shortness of breath)   . Difficulty walking  Aleksa Catterton,ANGIE PTA 10/12/2017, 1:11 PM  [place], [place], [address] Phone: [phone]   Fax:  [phone]  Name: Stacey Ramirez MRN: [id number] Date of Birth: 10/09/1940

## 2017-10-13 DIAGNOSIS — H04223 Epiphora due to insufficient drainage, bilateral lacrimal glands: Secondary | ICD-10-CM | POA: Diagnosis not present

## 2017-10-13 DIAGNOSIS — H2513 Age-related nuclear cataract, bilateral: Secondary | ICD-10-CM | POA: Diagnosis not present

## 2017-10-14 ENCOUNTER — Ambulatory Visit: Payer: PPO | Admitting: Physical Therapy

## 2017-10-14 DIAGNOSIS — R2689 Other abnormalities of gait and mobility: Secondary | ICD-10-CM

## 2017-10-14 DIAGNOSIS — R262 Difficulty in walking, not elsewhere classified: Secondary | ICD-10-CM

## 2017-10-14 DIAGNOSIS — M6281 Muscle weakness (generalized): Secondary | ICD-10-CM

## 2017-10-14 NOTE — Therapy (Signed)
[place], [place], [address] Phone: [phone]   Fax:  [phone]  Physical Therapy Treatment  Patient Details  Name: Stacey Ramirez MRN: [id number] Date of Birth: [birth year] Referring Provider: Deland Pretty   Encounter Date: 10/14/2017  PT End of Session - 10/14/17 1149    Visit Number  13    Date for PT Re-Evaluation  10/22/17    PT Start Time  1142    PT Stop Time  1230    PT Time Calculation (min)  48 min       Past Medical History:  Diagnosis Date  . Adjustment reaction with anxiety 12/20/2015  . Aortic stenosis   . CAD (coronary artery disease) 06/09/2017   Nuc study 10/18: EF 69, inferolateral perfusion defect-possible small infarct with peri-infarct ischemia; intermediate risk // LHC 10/18: pLAD 25, pLCx 50, pRCA 25 >> med Rx  . Carotid stenosis, asymptomatic, bilateral 02/27/2016   Carotid US 5/17: Bilateral ICA 1-39  . Cerebrovascular accident (stroke) ([place])    a. 12/2015 - cryptogenic.  S/P MDT Linq.  . Difficulty walking   . Essential hypertension   . Gait disorder 04/07/2016  . Heart palpitations 09/06/2012  . History of CVA with residual deficit 12/14/2015  . History of dizziness   . History of echocardiogram    Echo 10/18: Vigorous LVF, EF 65-70, normal wall motion, grade 1 diastolic dysfunction, GLS -21.1%, mild RAE  . History of nuclear stress test    Nuclear stress test 10/18: EF 69, inf-lat defect c/w poss infarct with peri-infarct ischemia; Intermediate Risk  . Hyperlipidemia   . Hypothyroidism   . Left hemiparesis ([place])   . LVH (left ventricular hypertrophy)   . Mitral regurgitation    a. 12/2015 Echo: EF 60-65%, mild focal basal hypertrophy. No rwma, triv AI, mild MR, mildly dil LA w/o thrombus. No RA thrombus. No PFO.  Marland Kitchen Palpitations   . Prediabetes   . Stroke-like symptom 12/13/2015  . SVT (supraventricular tachycardia) ([place]) 12/14/2015  . Vascular parkinsonism ([place])  06/29/2016  . Vitamin D deficiency     Past Surgical History:  Procedure Laterality Date  . CARDIOVASCULAR STRESS TEST  2002   NORMAL  . EP IMPLANTABLE DEVICE N/A 12/17/2015   Procedure: Loop Recorder Insertion;  Surgeon: [name], MD;  Location: [facility];  Service: Cardiovascular;  Laterality: N/A;  . LEFT HEART CATH AND CORONARY ANGIOGRAPHY N/A 05/25/2017   Procedure: LEFT HEART CATH AND CORONARY ANGIOGRAPHY;  Surgeon: Sherren Mocha, MD;  Location: [facility];  Service: Cardiovascular;  Laterality: N/A;  . LOOP RECORDER IMPLANT    . TEE WITHOUT CARDIOVERSION N/A 12/17/2015   Procedure: TRANSESOPHAGEAL ECHOCARDIOGRAM (TEE);  Surgeon: Lelon Perla, MD;  Location: [facility] ENDOSCOPY;  Service: Cardiovascular;  Laterality: N/A;  Pt also needs a LOOP  . TRANSTHORACIC ECHOCARDIOGRAM  2008   SHOWED LEFT VENTRICULAR HYPERTROPHY AND MILD AORTIC STENOSIS    There were no vitals filed for this visit.  Subjective Assessment - 10/14/17 1141    Subjective  walked great after last session, yesterday was bad and now today is better    Currently in Pain?  No/denies                      [facility] Adult PT Treatment/Exercise - 10/14/17 0001      Ambulation/Gait   Stairs  Yes    Stair Management Technique  Alternating pattern;One  rail Left HHA to do setp over step    Gait Comments  walking with canes to increase cadeance and stride      High Level Balance   High Level Balance Activities  Side stepping;Marching forwards;Marching backwards HHA with 3# wts    High Level Balance Comments  HHA 3# on airex alt LE ex      Knee/Hip Exercises: Aerobic   Nustep  L5 x 7 mins      Knee/Hip Exercises: Machines for Strengthening   Cybex Knee Extension  10# 3 sets 10    Cybex Knee Flexion  20# 3 sets 10      Knee/Hip Exercises: Standing   Other Standing Knee Exercises  3# 8 in step tap alt 20 fwd 2 times. then 15 taps SW each leg HHA required      Knee/Hip Exercises:  Seated   Other Seated Knee/Hip Exercises  3# LE alt mvmt with speed to increase reciprocal mvmt               PT Short Term Goals - 09/02/17 1435      PT SHORT TERM GOAL #1   Title  independent with her home HEP    Time  2    Period  Weeks    Status  On-going        PT Long Term Goals - 10/12/17 1302      PT LONG TERM GOAL #1   Title  decrease TUG time to 14 seconds for functional gait and safety    Baseline  13 sec    Status  Achieved      PT LONG TERM GOAL #2   Title  increase Berg balance score to 46/56 for safety    Baseline  40/56    Status  Partially Met      PT LONG TERM GOAL #3   Title  increase LE strength (particularly ABD and ext.) to 4/5 for functional gait and safety    Status  On-going            Plan - 10/14/17 1222    Clinical Impression Statement  pt with left leg weakness noted with 3# ex vs RT and some neglect on left with steps. encouranged step over step and increased stride and speed with walking and tolerated well with assistance and cuing.    PT Treatment/Interventions  Stair training;Gait training;Functional mobility training;Therapeutic activities;Therapeutic exercise;Balance training;Patient/family education    PT Next Visit Plan  continue strengthening/gait and balance,        Patient will benefit from skilled therapeutic intervention in order to improve the following deficits and impairments:  Abnormal gait, Decreased strength, Decreased balance, Difficulty walking, Decreased activity tolerance, Decreased coordination, Decreased mobility  Visit Diagnosis: Muscle weakness (generalized)  Difficulty in walking, not elsewhere classified  Balance problem     Problem List Patient Active Problem List   Diagnosis Date Noted  . CAD (coronary artery disease) 06/09/2017  . Abnormal nuclear cardiac imaging test 05/25/2017  . PVC's (premature ventricular contractions) 05/17/2017  . Anxiety 12/29/2016  . Vascular parkinsonism  ([place]) 06/29/2016  . Gait disorder 04/07/2016  . Carotid stenosis, asymptomatic, bilateral 02/27/2016  . Left-sided weakness 12/28/2015  . Headache 12/28/2015  . Adjustment reaction with anxiety 12/20/2015  . Left hemiparesis ([place])   . History of CVA with residual deficit   . Prediabetes   . SVT (supraventricular tachycardia) ([place]) 12/14/2015  . Stroke-like symptom 12/13/2015  . Heart palpitations 09/06/2012  . Hypertension   .  Hyperlipidemia   . Hypothyroidism   . Vitamin D deficiency   . Palpitations   . History of dizziness   . LVH (left ventricular hypertrophy)   . Aortic stenosis   . Mitral regurgitation   . SOB (shortness of breath)   . Difficulty walking     ,ANGIE PTA 10/14/2017, 12:24 PM  [place], [place], [address] Phone: [phone]   Fax:  [phone]  Name: Stacey Ramirez MRN: [id number] Date of Birth: [birth year]

## 2017-10-18 ENCOUNTER — Encounter (HOSPITAL_BASED_OUTPATIENT_CLINIC_OR_DEPARTMENT_OTHER): Payer: Self-pay

## 2017-10-18 ENCOUNTER — Other Ambulatory Visit: Payer: Self-pay

## 2017-10-18 ENCOUNTER — Telehealth: Payer: Self-pay | Admitting: Neurology

## 2017-10-18 ENCOUNTER — Emergency Department (HOSPITAL_BASED_OUTPATIENT_CLINIC_OR_DEPARTMENT_OTHER)
Admission: EM | Admit: 2017-10-18 | Discharge: 2017-10-18 | Disposition: A | Discharge status: Home / Self Care | Payer: PPO | Attending: Emergency Medicine | Admitting: Emergency Medicine

## 2017-10-18 DIAGNOSIS — R29898 Other symptoms and signs involving the musculoskeletal system: Secondary | ICD-10-CM

## 2017-10-18 DIAGNOSIS — R011 Cardiac murmur, unspecified: Secondary | ICD-10-CM | POA: Insufficient documentation

## 2017-10-18 DIAGNOSIS — E039 Hypothyroidism, unspecified: Secondary | ICD-10-CM | POA: Insufficient documentation

## 2017-10-18 DIAGNOSIS — I10 Essential (primary) hypertension: Secondary | ICD-10-CM | POA: Insufficient documentation

## 2017-10-18 DIAGNOSIS — I2510 Atherosclerotic heart disease of native coronary artery without angina pectoris: Secondary | ICD-10-CM | POA: Insufficient documentation

## 2017-10-18 DIAGNOSIS — M6281 Muscle weakness (generalized): Secondary | ICD-10-CM | POA: Diagnosis not present

## 2017-10-18 DIAGNOSIS — Z8673 Personal history of transient ischemic attack (TIA), and cerebral infarction without residual deficits: Secondary | ICD-10-CM | POA: Diagnosis not present

## 2017-10-18 DIAGNOSIS — Z79899 Other long term (current) drug therapy: Secondary | ICD-10-CM | POA: Insufficient documentation

## 2017-10-18 DIAGNOSIS — Z7901 Long term (current) use of anticoagulants: Secondary | ICD-10-CM | POA: Diagnosis not present

## 2017-10-18 DIAGNOSIS — R000 Tachycardia, unspecified: Secondary | ICD-10-CM | POA: Diagnosis not present

## 2017-10-18 LAB — CBC WITH DIFFERENTIAL/PLATELET
BASOS ABS: 0 10*3/uL (ref 0.0–0.1)
Basophils Relative: 0 %
Eosinophils Absolute: 0.2 10*3/uL (ref 0.0–0.7)
Eosinophils Relative: 3 %
HEMATOCRIT: 41.5 % (ref 36.0–46.0)
Hemoglobin: 13.8 g/dL (ref 12.0–15.0)
Lymphocytes Relative: 23 %
Lymphs Abs: 1.2 10*3/uL (ref 0.7–4.0)
MCH: 31.5 pg (ref 26.0–34.0)
MCHC: 33.3 g/dL (ref 30.0–36.0)
MCV: 94.7 fL (ref 78.0–100.0)
MONO ABS: 0.5 10*3/uL (ref 0.1–1.0)
Monocytes Relative: 11 %
NEUTROS ABS: 3.2 10*3/uL (ref 1.7–7.7)
Neutrophils Relative %: 63 %
PLATELETS: 241 10*3/uL (ref 150–400)
RBC: 4.38 MIL/uL (ref 3.87–5.11)
RDW: 13 % (ref 11.5–15.5)
WBC: 5.1 10*3/uL (ref 4.0–10.5)

## 2017-10-18 LAB — BASIC METABOLIC PANEL
Anion gap: 7 (ref 5–15)
BUN: 13 mg/dL (ref 6–20)
CALCIUM: 9.7 mg/dL (ref 8.9–10.3)
CO2: 25 mmol/L (ref 22–32)
Chloride: 106 mmol/L (ref 101–111)
Creatinine, Ser: 0.73 mg/dL (ref 0.44–1.00)
Glucose, Bld: 108 mg/dL — ABNORMAL HIGH (ref 65–99)
Potassium: 3.9 mmol/L (ref 3.5–5.1)
Sodium: 138 mmol/L (ref 135–145)

## 2017-10-18 LAB — URINALYSIS, ROUTINE W REFLEX MICROSCOPIC
Bilirubin Urine: NEGATIVE
Glucose, UA: NEGATIVE mg/dL
Hgb urine dipstick: NEGATIVE
KETONES UR: NEGATIVE mg/dL
LEUKOCYTES UA: NEGATIVE
NITRITE: NEGATIVE
PROTEIN: NEGATIVE mg/dL
Specific Gravity, Urine: 1.01 (ref 1.005–1.030)
pH: 7.5 (ref 5.0–8.0)

## 2017-10-18 NOTE — ED Notes (Signed)
NAD at this time. Pt is stable and going home.  

## 2017-10-18 NOTE — ED Notes (Signed)
ED Provider at bedside. 

## 2017-10-18 NOTE — ED Provider Notes (Signed)
[facility] EMERGENCY DEPARTMENT Provider Note   CSN: [id number] Arrival date & time: 10/18/17  1202     History   Chief Complaint Chief Complaint  Patient presents with  . Extremity Weakness    HPI Stacey Ramirez is a 77 y.o. female.  Patient is a 77 year old female with a history of coronary artery disease, aortic stenosis, difficulty walking, prior CVA 2 years ago with residual left hemiparesis, hypertension, hyperlipidemia presenting today with worsening left leg weakness.  Patient states for the last 2 years since her stroke she has waxing and waning weakness of her left leg.  She works with physical therapy and states some days are really good and then notable just become weak.  She had a good week this whole last week and was able to walk without a walker but then yesterday noted her leg to be very weak which continued today.  Patient is having to use her walker to get around so that she does not fall.  She denies any reason where she would be dehydrated she has been eating and drinking normally.  She has not had any recent illness.  No recent medication changes.  No fever, cough, chest pain, shortness of breath, abdominal pain, nausea vomiting or diarrhea.  Patient denies any sensation loss of her left leg.  No facial droop, aphasia or swallowing difficulty.  She denies any trauma or headache.  Vision is unchanged.   The history is provided by the patient and the spouse.  Extremity Weakness  This is a recurrent problem. The current episode started yesterday. The problem occurs constantly. The problem has not changed since onset.   Past Medical History:  Diagnosis Date  . Adjustment reaction with anxiety 12/20/2015  . Aortic stenosis   . CAD (coronary artery disease) 06/09/2017   Nuc study 10/18: EF 69, inferolateral perfusion defect-possible small infarct with peri-infarct ischemia; intermediate risk // LHC 10/18: pLAD 25, pLCx 50, pRCA 25 >> med Rx  . Carotid stenosis,  asymptomatic, bilateral 02/27/2016   Carotid US 5/17: Bilateral ICA 1-39  . Cerebrovascular accident (stroke) ([place])    a. 12/2015 - cryptogenic.  S/P MDT Linq.  . Difficulty walking   . Essential hypertension   . Gait disorder 04/07/2016  . Heart palpitations 09/06/2012  . History of CVA with residual deficit 12/14/2015  . History of dizziness   . History of echocardiogram    Echo 10/18: Vigorous LVF, EF 65-70, normal wall motion, grade 1 diastolic dysfunction, GLS -21.1%, mild RAE  . History of nuclear stress test    Nuclear stress test 10/18: EF 69, inf-lat defect c/w poss infarct with peri-infarct ischemia; Intermediate Risk  . Hyperlipidemia   . Hypothyroidism   . Left hemiparesis ([place])   . LVH (left ventricular hypertrophy)   . Mitral regurgitation    a. 12/2015 Echo: EF 60-65%, mild focal basal hypertrophy. No rwma, triv AI, mild MR, mildly dil LA w/o thrombus. No RA thrombus. No PFO.  Marland Kitchen Palpitations   . Prediabetes   . Stroke-like symptom 12/13/2015  . SVT (supraventricular tachycardia) ([place]) 12/14/2015  . Vascular parkinsonism ([place]) 06/29/2016  . Vitamin D deficiency     Patient Active Problem List   Diagnosis Date Noted  . CAD (coronary artery disease) 06/09/2017  . Abnormal nuclear cardiac imaging test 05/25/2017  . PVC's (premature ventricular contractions) 05/17/2017  . Anxiety 12/29/2016  . Vascular parkinsonism ([place]) 06/29/2016  . Gait disorder 04/07/2016  . Carotid stenosis, asymptomatic, bilateral 02/27/2016  .  Left-sided weakness 12/28/2015  . Headache 12/28/2015  . Adjustment reaction with anxiety 12/20/2015  . Left hemiparesis ([place])   . History of CVA with residual deficit   . Prediabetes   . SVT (supraventricular tachycardia) ([place]) 12/14/2015  . Stroke-like symptom 12/13/2015  . Heart palpitations 09/06/2012  . Hypertension   . Hyperlipidemia   . Hypothyroidism   . Vitamin D deficiency   . Palpitations   . History of dizziness   . LVH (left ventricular  hypertrophy)   . Aortic stenosis   . Mitral regurgitation   . SOB (shortness of breath)   . Difficulty walking     Past Surgical History:  Procedure Laterality Date  . CARDIOVASCULAR STRESS TEST  2002   NORMAL  . EP IMPLANTABLE DEVICE N/A 12/17/2015   Procedure: Loop Recorder Insertion;  Surgeon: [name], MD;  Location: [facility];  Service: Cardiovascular;  Laterality: N/A;  . LEFT HEART CATH AND CORONARY ANGIOGRAPHY N/A 05/25/2017   Procedure: LEFT HEART CATH AND CORONARY ANGIOGRAPHY;  Surgeon: Sherren Mocha, MD;  Location: [facility];  Service: Cardiovascular;  Laterality: N/A;  . LOOP RECORDER IMPLANT    . TEE WITHOUT CARDIOVERSION N/A 12/17/2015   Procedure: TRANSESOPHAGEAL ECHOCARDIOGRAM (TEE);  Surgeon: Lelon Perla, MD;  Location: [facility] ENDOSCOPY;  Service: Cardiovascular;  Laterality: N/A;  Pt also needs a LOOP  . TRANSTHORACIC ECHOCARDIOGRAM  2008   SHOWED LEFT VENTRICULAR HYPERTROPHY AND MILD AORTIC STENOSIS    OB History    No data available       Home Medications    Prior to Admission medications   Medication Sig Start Date End Date Taking? Authorizing Provider  ALPRAZolam Duanne Moron) 0.25 MG tablet Take 0.25 mg by mouth at bedtime as needed for anxiety or sleep.    [provider]  amLODipine (NORVASC) 5 MG tablet Take 1 tablet by mouth daily. 08/24/17   [provider]  carboxymethylcellulose (REFRESH PLUS) 0.5 % SOLN Place 1 drop into both eyes 3 (three) times daily as needed (DRY EYE).     [provider]  Cholecalciferol (VITAMIN D) 2000 units tablet Take 2,000 Units by mouth daily.    [provider]  clopidogrel (PLAVIX) 75 MG tablet Take 1 tablet (75 mg total) by mouth daily. 12/24/15   Love, Ivan Anchors, PA-C  irbesartan (AVAPRO) 300 MG tablet Take 1 tablet by mouth daily. 06/20/17   [provider]  levothyroxine (SYNTHROID, LEVOTHROID) 25 MCG tablet Take 25 mcg by mouth daily before breakfast.   09/21/14   [provider]  loratadine (CLARITIN) 10 MG tablet Take 10 mg by mouth daily as needed for allergies.     [provider]  LOTEMAX 0.5 % ophthalmic suspension Place 1 drop into both eyes 4 (four) times daily as needed. For dry itchy eyes 02/17/13   [provider]  Multiple Vitamin (MULTIVITAMIN) tablet Take 1 tablet by mouth daily.    [provider]  sertraline (ZOLOFT) 50 MG tablet Take 50 mg by mouth daily.     [provider]    Family History Family History  Problem Relation Age of Onset  . Heart attack Mother   . Alzheimer's disease Mother   . Stroke Paternal Uncle     Social History Social History   Tobacco Use  . Smoking status: Never Smoker  . Smokeless tobacco: Never Used  Substance Use Topics  . Alcohol use: Yes    Alcohol/week: 0.6 oz  Types: 1 Glasses of wine per week    Comment: socially  . Drug use: No     Allergies   Sulfa drugs cross reactors; Zocor [simvastatin]; Crestor [rosuvastatin calcium]; Statins; and Micardis [telmisartan]   Review of Systems Review of Systems  Musculoskeletal: Positive for extremity weakness.  All other systems reviewed and are negative.    Physical Exam Updated Vital Signs BP (!) 166/80 (BP Location: Left Arm)   Pulse (!) 102   Temp 98.5 F (36.9 C) (Oral)   Resp (!) 24   SpO2 96%   Physical Exam  Constitutional: She is oriented to person, place, and time. She appears well-developed and well-nourished. No distress.  HENT:  Head: Normocephalic and atraumatic.  Mouth/Throat: Oropharynx is clear and moist.  Eyes: Conjunctivae and EOM are normal. Pupils are equal, round, and reactive to light.  Neck: Normal range of motion. Neck supple.  Cardiovascular: Normal rate, regular rhythm and intact distal pulses.  Murmur heard. Pulmonary/Chest: Effort normal and breath sounds normal. No respiratory distress. She has no wheezes. She has no rales.  Abdominal: Soft. She  exhibits no distension. There is no tenderness. There is no rebound and no guarding.  Musculoskeletal: Normal range of motion. She exhibits no edema or tenderness.  Neurological: She is alert and oriented to person, place, and time. She has normal strength. No cranial nerve deficit or sensory deficit.  No pronator drift noted with bilateral lower extremities.  No drift in bilateral upper extremities.  Sensation is intact.  No notable weakness on my exam may be slightly more weak in the left leg opposed to the right.  5 out of 5 strength in bilateral upper extremities.  No facial droop noted.  Speech without aphasia.  Patient walking with a rolling walker without difficulty or ataxia  Skin: Skin is warm and dry. No rash noted. No erythema.  Psychiatric: She has a normal mood and affect. Her behavior is normal.  Nursing note and vitals reviewed.    ED Treatments / Results  Labs (all labs ordered are listed, but only abnormal results are displayed) Labs Reviewed  BASIC METABOLIC PANEL - Abnormal; Notable for the following components:      Result Value   Glucose, Bld 108 (*)    All other components within normal limits  URINALYSIS, ROUTINE W REFLEX MICROSCOPIC - Abnormal; Notable for the following components:   Color, Urine STRAW (*)    All other components within normal limits  CBC WITH DIFFERENTIAL/PLATELET    EKG  EKG Interpretation None       Radiology No results found.  Procedures Procedures (including critical care time)  Medications Ordered in ED Medications - No data to display   Initial Impression / Assessment and Plan / ED Course  I have reviewed the triage vital signs and the nursing notes.  Pertinent labs & imaging results that were available during my care of the patient were reviewed by me and considered in my medical decision making (see chart for details).     Patient presenting with worsening weakness in the left leg after known stroke 2 years ago with  chronic left hemiparesis.  Based on patient and her husband she will have waxing and waning weakness of her leg.  Seem to have been good all week and much more weak today and they were concerned may be something else was going on.  They spoke with her doctor who told him to come here.  She denies any recent infectious symptoms,  change of medications.  Patient does not have significant weakness on my exam and she is walking with a rolling walker without difficulty.  She is mildly tachycardic here to 102 but is in no acute distress.  She denies any recent infectious symptoms, urinary complaints.  Patient does take an ACE inhibitor so concern for possible electrolyte abnormality.  Low suspicion that patient is having a stroke currently as these are chronic ongoing symptoms without new manifestations.  She denies any trauma concerning for head bleed.  No seizure-like activity. CBC, BMP, UA pending  3:04 PM Labs wnl.  Again low suspicion for any acute intracranial process.  Recommended follow-up with PCP and possible second opinion for a different neurologist.  Final Clinical Impressions(s) / ED Diagnoses   Final diagnoses:  Weakness of left lower extremity    ED Discharge Orders    None       Blanchie Dessert, MD 10/18/17 1506

## 2017-10-18 NOTE — Telephone Encounter (Addendum)
RN call pts husband for appt. Rn schedule pt for Wednesday at 0230pm, and check in at 0200pm. The husband stated he may still take her to the hospital for a Ct scan. PT is using a walker, but one side is getting weaker. Husband is aware of time of appt. Pt is oriented x4.

## 2017-10-18 NOTE — Telephone Encounter (Signed)
Pt husband(on DPR) has called stating he thinks pt may have had TIA's over the weekend.  He states pt is unable to walk.  Husband was asked if he plans to take her to ED he said he wasn't sure as to what they would be able to do.  Pt husband would like a call to know if Dr Erlinda Hong could see pt this week.  Please call

## 2017-10-18 NOTE — ED Triage Notes (Signed)
Pt states she woke yesterday am with decreased movement/weakness left leg-hx of a stroke 2017 with left side deficit however decreased movement started yesterday am-pt with steady gait with own walker

## 2017-10-18 NOTE — Telephone Encounter (Signed)
I still has slot tomorrow and Wednesday. Please get her in and I will see her this week. Thanks.   Rosalin Hawking, MD PhD Stroke Neurology 10/18/2017 11:31 AM

## 2017-10-18 NOTE — Discharge Instructions (Signed)
Return to the emergency room immediately if you develop any symptoms in her hands, face or speech changes.  Or if you develop a severe headache or vision changes.

## 2017-10-19 ENCOUNTER — Ambulatory Visit: Payer: PPO | Admitting: Physical Therapy

## 2017-10-19 DIAGNOSIS — M6281 Muscle weakness (generalized): Secondary | ICD-10-CM | POA: Diagnosis not present

## 2017-10-19 DIAGNOSIS — R262 Difficulty in walking, not elsewhere classified: Secondary | ICD-10-CM

## 2017-10-19 DIAGNOSIS — R2689 Other abnormalities of gait and mobility: Secondary | ICD-10-CM

## 2017-10-19 NOTE — Therapy (Signed)
[facility] [place], [place], [address] Phone: [phone]   Fax:  [phone]  Physical Therapy Treatment  Patient Details  Name: Stacey Ramirez MRN: [id number] Date of Birth: Jan 31, 1941 Referring Provider: Deland Pretty   Encounter Date: 10/19/2017  PT End of Session - 10/19/17 1350    Visit Number  [id number]    Date for PT Re-Evaluation  10/22/17    PT Start Time  1315    PT Stop Time  1400    PT Time Calculation (min)  45 min       Past Medical History:  Diagnosis Date  . Adjustment reaction with anxiety 12/20/2015  . Aortic stenosis   . CAD (coronary artery disease) 06/09/2017   Nuc study 10/18: EF 69, inferolateral perfusion defect-possible small infarct with peri-infarct ischemia; intermediate risk // LHC 10/18: pLAD 25, pLCx 50, pRCA 25 >> med Rx  . Carotid stenosis, asymptomatic, bilateral 02/27/2016   Carotid US 5/17: Bilateral ICA 1-39  . Cerebrovascular accident (stroke) ([place])    a. 12/2015 - cryptogenic.  S/P MDT Linq.  . Difficulty walking   . Essential hypertension   . Gait disorder 04/07/2016  . Heart palpitations 09/06/2012  . History of CVA with residual deficit 12/14/2015  . History of dizziness   . History of echocardiogram    Echo 10/18: Vigorous LVF, EF 65-70, normal wall motion, grade 1 diastolic dysfunction, GLS -21.1%, mild RAE  . History of nuclear stress test    Nuclear stress test 10/18: EF 69, inf-lat defect c/w poss infarct with peri-infarct ischemia; Intermediate Risk  . Hyperlipidemia   . Hypothyroidism   . Left hemiparesis ([place])   . LVH (left ventricular hypertrophy)   . Mitral regurgitation    a. 12/2015 Echo: EF 60-65%, mild focal basal hypertrophy. No rwma, triv AI, mild MR, mildly dil LA w/o thrombus. No RA thrombus. No PFO.  Marland Kitchen Palpitations   . Prediabetes   . Stroke-like symptom 12/13/2015  . SVT (supraventricular tachycardia) ([place]) 12/14/2015  . Vascular parkinsonism ([place])  06/29/2016  . Vitamin D deficiency     Past Surgical History:  Procedure Laterality Date  . CARDIOVASCULAR STRESS TEST  2002   NORMAL  . EP IMPLANTABLE DEVICE N/A 12/17/2015   Procedure: Loop Recorder Insertion;  Surgeon: [name], MD;  Location: [facility] LAB;  Service: Cardiovascular;  Laterality: N/A;  . LEFT HEART CATH AND CORONARY ANGIOGRAPHY N/A 05/25/2017   Procedure: LEFT HEART CATH AND CORONARY ANGIOGRAPHY;  Surgeon: Sherren Mocha, MD;  Location: [facility] LAB;  Service: Cardiovascular;  Laterality: N/A;  . LOOP RECORDER IMPLANT    . TEE WITHOUT CARDIOVERSION N/A 12/17/2015   Procedure: TRANSESOPHAGEAL ECHOCARDIOGRAM (TEE);  Surgeon: Lelon Perla, MD;  Location: [facility] ENDOSCOPY;  Service: Cardiovascular;  Laterality: N/A;  Pt also needs a LOOP  . TRANSTHORACIC ECHOCARDIOGRAM  2008   SHOWED LEFT VENTRICULAR HYPERTROPHY AND MILD AORTIC STENOSIS    There were no vitals filed for this visit.  Subjective Assessment - 10/19/17 1316    Subjective  i have been doing the best I have in a long while after last session. Saturday had a fall ( denies injury) Sunday woke up and could not move Left leg- went to ER. Seeing neuro MD tomorrow    Currently in Pain?  No/denies                      [facility] Adult  PT Treatment/Exercise - 10/19/17 0001      Ambulation/Gait   Gait Comments  walking with and without canes to increase cadeance and stride decreased advancement of left leg and left toe catching      High Level Balance   High Level Balance Activities  Backward walking;Marching forwards;Side stepping;Negotiating over obstacles      Knee/Hip Exercises: Aerobic   Recumbent Bike  6 min    Nustep  L5 x 7 mins               PT Short Term Goals - 09/02/17 1435      PT SHORT TERM GOAL #1   Title  independent with her home HEP    Time  2    Period  Weeks    Status  On-going        PT Long Term Goals - 10/12/17 1302      PT LONG TERM GOAL #1    Title  decrease TUG time to 14 seconds for functional gait and safety    Baseline  13 sec    Status  Achieved      PT LONG TERM GOAL #2   Title  increase Berg balance score to 46/56 for safety    Baseline  40/56    Status  Partially Met      PT LONG TERM GOAL #3   Title  increase LE strength (particularly ABD and ext.) to 4/5 for functional gait and safety    Status  On-going            Plan - 10/19/17 1351    Clinical Impression Statement  pt less steady today, left leg dragging,decreased balance and "whozzines". focus on gait and increasing stride/confidence- min A needed    PT Treatment/Interventions  Stair training;Gait training;Functional mobility training;Therapeutic activities;Therapeutic exercise;Balance training;Patient/family education    PT Next Visit Plan  continue strengthening/gait and balance, ASSESS how neuro visit went       Patient will benefit from skilled therapeutic intervention in order to improve the following deficits and impairments:  Abnormal gait, Decreased strength, Decreased balance, Difficulty walking, Decreased activity tolerance, Decreased coordination, Decreased mobility  Visit Diagnosis: Muscle weakness (generalized)  Difficulty in walking, not elsewhere classified  Balance problem     Problem List Patient Active Problem List   Diagnosis Date Noted  . CAD (coronary artery disease) 06/09/2017  . Abnormal nuclear cardiac imaging test 05/25/2017  . PVC's (premature ventricular contractions) 05/17/2017  . Anxiety 12/29/2016  . Vascular parkinsonism ([place]) 06/29/2016  . Gait disorder 04/07/2016  . Carotid stenosis, asymptomatic, bilateral 02/27/2016  . Left-sided weakness 12/28/2015  . Headache 12/28/2015  . Adjustment reaction with anxiety 12/20/2015  . Left hemiparesis ([place])   . History of CVA with residual deficit   . Prediabetes   . SVT (supraventricular tachycardia) ([place]) 12/14/2015  . Stroke-like symptom 12/13/2015  . Heart  palpitations 09/06/2012  . Hypertension   . Hyperlipidemia   . Hypothyroidism   . Vitamin D deficiency   . Palpitations   . History of dizziness   . LVH (left ventricular hypertrophy)   . Aortic stenosis   . Mitral regurgitation   . SOB (shortness of breath)   . Difficulty walking     PAYSEUR,ANGIE PTA 10/19/2017, 1:53 PM  [facility] [place], [place], [address] Phone: [phone]   Fax:  [phone]  Name: Stacey Ramirez MRN: [id number] Date of Birth: 15-Apr-1941

## 2017-10-20 ENCOUNTER — Ambulatory Visit (INDEPENDENT_AMBULATORY_CARE_PROVIDER_SITE_OTHER): Payer: PPO | Admitting: Neurology

## 2017-10-20 ENCOUNTER — Encounter: Payer: Self-pay | Admitting: Neurology

## 2017-10-20 VITALS — BP 158/76 | HR 80 | Wt 120.6 lb

## 2017-10-20 DIAGNOSIS — I10 Essential (primary) hypertension: Secondary | ICD-10-CM | POA: Diagnosis not present

## 2017-10-20 DIAGNOSIS — Z8673 Personal history of transient ischemic attack (TIA), and cerebral infarction without residual deficits: Secondary | ICD-10-CM

## 2017-10-20 DIAGNOSIS — F419 Anxiety disorder, unspecified: Secondary | ICD-10-CM | POA: Diagnosis not present

## 2017-10-20 DIAGNOSIS — I6523 Occlusion and stenosis of bilateral carotid arteries: Secondary | ICD-10-CM | POA: Diagnosis not present

## 2017-10-20 DIAGNOSIS — F444 Conversion disorder with motor symptom or deficit: Secondary | ICD-10-CM

## 2017-10-20 NOTE — Progress Notes (Signed)
STROKE NEUROLOGY FOLLOW UP NOTE  NAME: Stacey Ramirez DOB: October 08, 1940  REASON FOR VISIT: stroke follow up HISTORY FROM: pt and husband and chart  Today we had the pleasure of seeing Stacey Ramirez in follow-up at our Neurology Clinic. Pt was accompanied by husband.   History Summary [name] is a 77 y.o. female with history of hypertension, hyperlipidemia, hypothyroidism, aortic stenosis, mitral regurgitation, and previous stroke admitted on 12/13/15 for left hemiparesis. MRI showed acute moderate large infarct in the right BG as well as a punctate infarct periventricular WM adjacent to right occipital horn, more embolic pattern. MRA showed moderate stenosis of the supraclinoid right ICA, right P1 and left P2. CUS, TTE, DVT screening, TEE all negative. A1C 5.7 and LDL 238. Loop recorder placed. She was discharged to CIR with DAPT and crestor.   Follow up 02/27/16 - the patient has been doing well. She stayed in CIR for one week and discharged home with home PT/OT and now on outpt PT/OT. She still not able to tolerate statin and was put now on praluent. On 12/27/15 she was admitted for HA and muscle weakness. She was treated for high BP and crestor discontinued on discharge. She went back to ER on 01/01/16 due to generalized weakness, SOB and near fall. CT head showed no acute changes. Felt related to stress and anxiety. Since then, pt continue to feel easy fatigue, generalized weakness and depressed mood. She was put on zoloft. She continues to work with PT/OT and currently gradually recovering. BP today 140/78. Loop recorder so far no afib.  Follow up 06/29/16 - pt has been doing well. She still has outpt PT/OT 2/week. She stated that she felt her strength and balance did not improve over time. She did not do too much home exercise though. Her BP at home 130-150/75-85 in the morning. However, after BP meds, her BP drops, sometimes significantly. Pt felt that could be one of the reasons that she  felt tired during exercise. Her BP today 164/85 in clinic. She is on plavix. She was also on praluent but LDL not in good control, and now she is on repatha. Loop recorder no afib so far.  Follow up 09/23/16 - pt stated that she was on praluent for 5-6 months and repatha for 2 months, but her LDL still high and not coming down. Also due to mild weakness side effect, those medications were discontinued. She was put on Livalo, but still not able to tolerate due to bilateral leg weakness. For the last 2 months, she felt continued leg weakness, not able to walk well, also felt lightheadedness. However, her PT/OT was considered well and she was discharged from rehab services. In clinic, she had likely cautious gait and psychogenic gait, most consistent with anxiety and depression. BP 140/78.  12/29/16 follow up - pt continues to feel no improvement of her walking.she had PT/OT but no significant improvement. PT concerns about PD and pt was very concerned and checked with internet, found herself to have not sleeping good, voice lower than before, shuffling gait, not able to climb stairs, easily fatigued, and not enough energy. She had one remote relative had PD as she can remember. She feels dizzy when walking, especially in tight space such as grocery stores. This symptoms started after the stroke one year ago and a fall 7-8 months ago. She is on plavix without side effect. Still on zoloft 50mg  and did not see any effect. EMG/NCS showed mild sensory  motor neuropathy. Her BP 155/77 today but at home always 120-130s.  However, during the encounter, pt was able to talk normally without low voice, seems anxious and crying, husband admitted that she was anxious at home and terrified when the PT asked if she has relatives having PD.   05/04/17 follow up - pt has been doing the same. She saw Dr. Rexene Alberts in 03/2017 and no parkinson features considered. She also had a several visit with psychologist and did not find any help.  She also finished with PT for several sessions and found no significant improvement. Loop recorder no A. fib. Currently, she is able to walk in house with caution, however not to walk outside, she has to hold her husband and walking outside. On 04/25/17, she had ED visit due to hypertensive urgency, feeling woozy but BP cannot be controlled at home with tremendous anxiety. Had a CT head which is negative at that time.   Interval History During the interval time, pt has been doing well, much improved with gait after working PT/OT until 10/17/17 when she was having her birthday party. She was excited and started to have left leg weakness, it was so weak that she could not move at all. She and husband denies any facial droop or arm weakness. They did not go to ED. The left leg weakness gradually getting better but they decided to go to ED the second day. In ED, as per chart, ED physician did not feel any weakness present, and no brain images taken. Low suspicious for stroke at that time and was discharged. But ED physician recommended to follow up with PCP and possible second opinion for a different neurologist.   10/19/17 pt and husband called into our clinic and asked to be seen sooner for possible TIA work up. Today in clinic, pt no significant weakness, however, on walking still with very cautions gait, small stride, shuffles, but if hold onto her husband, she was able to walk near normal. She and husband denies any stress, anxiety, but admitted that subconsciously she may have some worries. When offered second opinion to [facility] neurology or [facility], they declined at this time. I referred pt to see a psychologist in the past, but as per pt and husband, the psychologist felt that she was normal and no need for any psychology assistance.    REVIEW OF SYSTEMS: Full 14 system review of systems performed and notable only for those listed below and in HPI above, all others are negative:    Constitutional:   Cardiovascular: murmur Ear/Nose/Throat:   Skin:  Eyes:   Respiratory:   Gastroitestinal:   Genitourinary:  Hematology/Lymphatic:  Endocrine:  Musculoskeletal:  Walking difficulty Allergy/Immunology:   Neurological:  dizziness, weakness Psychiatric:  Sleep:   The following represents the patient's updated allergies and side effects list: Allergies  Allergen Reactions  . Sulfa Drugs Cross Reactors Itching  . Zocor [Simvastatin] Other (See Comments)    Feels like she is going to pass out, weakness  . Crestor [Rosuvastatin Calcium]     Muscle weakness  . Statins Other (See Comments)    Unable to walk or stand. Unable to walk or stand. Unable to walk or stand.  . Sulfa Antibiotics     welts  . Micardis [Telmisartan] Other (See Comments)    Feels like she is going to pass out    The neurologically relevant items on the patient's problem list were reviewed on today's visit.  Neurologic Examination  A problem focused neurological exam (12 or more points of the single system neurologic examination, vital signs counts as 1 point, cranial nerves count for 8 points) was performed.  Blood pressure (!) 158/76, pulse 80, weight 120 lb 9.6 oz (54.7 kg).  General - Well nourished, well developed, in no apparent distress.  Ophthalmologic - Sharp disc margins OU.   Cardiovascular - Regular rate and rhythm with no murmur.  Mental Status -  Level of arousal and orientation to time, place, and person were intact. Language including expression, naming, repetition, comprehension was assessed and found intact. Fund of Knowledge was assessed and was intact.  Cranial Nerves II - XII - II - Visual field intact OU. III, IV, VI - Extraocular movements intact. V - Facial sensation intact bilaterally. VII - Facial movement intact bilaterally. VIII - Hearing & vestibular intact bilaterally. X - Palate elevates symmetrically. XI - Chin turning & shoulder shrug intact  bilaterally. XII - Tongue protrusion intact.  Motor Strength - The patient's strength was normal in all extremities and pronator drift was absent.  Bulk was normal and fasciculations were absent.   Motor Tone - Muscle tone was assessed at the neck and appendages and was normal, no rigidity.  Reflexes - The patient's reflexes were 1+ in all extremities and she had no pathological reflexes.  Sensory - Light touch, temperature/pinprick were assessed and were normal.    Coordination - The patient had normal movements in the hands and feet with no ataxia or dysmetria.  Tremor was absent. No cog-wheeling.   Gait and Station - stand up from chair without difficulty, however, very cautious gait, trying to hold onto things, frequent stop, no gait initiation difficulty, slow en bloc turns with arm extended. Walking normally if holding husband arm. No stooped posturing. Broad-based gait present.   Data reviewed: I personally reviewed the images and agree with the radiology interpretations.  Ct Head Wo Contrast 12/13/2015   No acute abnormality. Atrophy and chronic microvascular ischemic change.   Mri and Mra Head/brain Wo Cm 12/13/2015   1. Confluent acute infarct in the right basal ganglia with no associated hemorrhage or mass effect. Superimposed punctate posterior right MCA periventricular white matter infarct.  2. Negative for emergent large vessel occlusion. Anterior circulation atherosclerosis appears stable since 2014, including at least moderate stenosis of the supraclinoid right ICA (series 705, image 6).  3. Mild progression of posterior circulation atherosclerosis since 2014 including new mild stenosis of the mid basilar artery and moderate stenosis of the right PCA P1 segment.  4. Underlying chronic small vessel ischemia, moderate for age.   LE venous doppler - Lower extremity venous duplex has been completed. Preliminary findings: No evidence of DVT or baker's cyst.  CUS -  Bilateral: 1-39% ICA stenosis. Vertebral artery flow is antegrade.  TTE - Left ventricle: The cavity size was normal. Wall thickness was  increased in a pattern of mild LVH. Systolic function was normal.  The estimated ejection fraction was in the range of 60% to 65%.  Wall motion was normal; there were no regional wall motion  abnormalities. Doppler parameters are consistent with abnormal  left ventricular relaxation (grade 1 diastolic dysfunction). - Aortic valve: There was no stenosis. - Mitral valve: There was no significant regurgitation. - Right ventricle: The cavity size was normal. Systolic function  was normal. - Pulmonary arteries: No complete TR doppler jet so unable to  estimate PA systolic pressure. - Inferior vena cava: The vessel was normal in size.  The  respirophasic diameter changes were in the normal range (>= 50%),  consistent with normal central venous pressure. Impressions: - Normal LV size with mild LV hypertrophy. EF 60-65%. Normal RV  size and systolic function. No significant valvular  abnormalities.  TEE - normal LV function; negative saline microcavitation study  CT head 12/28/15 1. Expected evolution of recent right basal ganglia infarct. No hemorrhagic transformation, increased size or mass effect. 2. Background atrophy and chronic small vessel ischemia, unchanged. No new abnormality is seen.  CT head 01/01/16 1. The known right basal ganglia and right periventricular white matter infarct shown on 12/13/2015 is surprisingly indistinct on today's exam, with only very vague heterogeneity in this vicinity. No hemorrhagic transformation or acute complicating feature. 2. Old lacunar infarct of the left anterior thalamus. Chronic microvascular white matter disease. No new significant abnormality observed.  CT head 04/25/17 No acute intracranial abnormalities, old infarcts.  Component     Latest Ref Rng & Units 12/13/2015  Cholesterol     0 - 200  mg/dL 315 (H)  Triglycerides     <150 mg/dL 59  HDL Cholesterol     >40 mg/dL 65  Total CHOL/HDL Ratio     RATIO 4.8  VLDL     0 - 40 mg/dL 12  LDL (calc)     0 - 99 mg/dL 238 (H)  Hemoglobin A1C     4.8 - 5.6 % 5.7 (H)  Mean Plasma Glucose     mg/dL 117  TSH     0.350 - 4.500 uIU/mL 0.911    Assessment: As you may recall, she is a 77 y.o. Caucasian female with PMH of HTN, HLD, hypothyroidism, aortic stenosis, mitral regurgitation, and previous stroke admitted on 12/13/15 for acute moderate large infarct in the right BG as well as a punctate infarct periventricular WM adjacent to right occipital horn. MRA showed severe stenosis of the supraclinoid right ICA, as well as right P1 and left P2. CUS, TTE, DVT screening, TEE all negative. A1C 5.7 and LDL 238. Loop recorder placed. She was discharged to CIR with DAPT and crestor. Pt had previous stroke in 04/2013 with small left BG infarct as well as remote left cerebellar infarct. MRA at that time also showed right ICA supraclinoid segment severe stenosis. Therefore, the current stroke is more likely due to large vessel disease. So far loop recorder no afib. Pt had dramatic recovery and no focal deficit. However, she had post stroke fatigue and depression. On zoloft. Was put on praluent and then repatha for HLD treatment due to statin intolerance, however, LDL still not in control. Not able to tolerate Livalo.   However, keep complains of walking difficulty, dizziness and leg weakness, but had graduated from outpt PT/OT. On exam, she likely had cautious and psychogenic gait, no parkinsonian signs. EMG/NCS showed mild sensory motor neuropathy, no muscle abnormalities. Her condition still consistent with anxiety/stress related with chronic subjective dizziness and gait difficulty. Referred to psychology, did not feel much help. Pt saw Dr. Rexene Alberts and ruled out Parkinson features. Ordered again PT/OT, has been improved over time, but relapsed again with  isolated left leg weakness on 10/17/17 with her birthday party. Went to ED, low suspicious for stroke. Condition still consistent with psychogenic condition. Again, patient was encouraged with relaxation, building up confidence, positive feedback to help her gait. Offered second opinion from other neurologist as recommended by ED physician but pt and husband declined at this time.   Plan:  - continue plavix  for stroke prevention.  - contact Dr. Cathie Olden for high cholesterol treatment.  - continue walking exercise at home and with PT and building up confidence.  - relaxation and de-stress are still the key to improve. Anxiety and stress usually worsen your symptoms.  - continue zoloft for anxiety and depression - Follow up with your primary care physician for stroke risk factor modification. Recommend maintain blood pressure goal <130/80, diabetes with hemoglobin A1c goal below 7.0% and lipids with LDL cholesterol goal below 70 mg/dL.  - check BP at home and record.  - let us know if you would like to have second opinion from outside neurologist.  - continue loop recorder monitoring and so far no afib.  - follow up in 6 months.   I spent more than 25 minutes of face to face time with the patient. Greater than 50% of time was spent in counseling and coordination of care. We discussed about gait disorder related to stress/anxiety/low confidence, and offered second opinion from other neurologist.    No orders of the defined types were placed in this encounter.   No orders of the defined types were placed in this encounter.   Patient Instructions  - continue plavix for stroke prevention.  - contact Dr. Cathie Olden for high cholesterol treatment.  - continue walking exercise at home and with PT and building up confidence.  - relaxation and de-stress are still the key to improve. Anxiety and stress usually cause your symptoms.  - continue zoloft for anxiety and depression - Follow up with your primary  care physician for stroke risk factor modification. Recommend maintain blood pressure goal <130/80, diabetes with hemoglobin A1c goal below 7.0% and lipids with LDL cholesterol goal below 70 mg/dL.  - check BP at home and record.  - let us know if you would like to have second opinion from outside neurologist.  - continue loop recorder monitoring and so far no afib.  - follow up in 6 months.    Rosalin Hawking, MD PhD [facility] [address] [place], [place] [address] [phone]

## 2017-10-20 NOTE — Patient Instructions (Addendum)
-   continue plavix for stroke prevention.  - contact Dr. Cathie Olden for high cholesterol treatment.  - continue walking exercise at home and with PT and building up confidence.  - relaxation and de-stress are still the key to improve. Anxiety and stress usually cause your symptoms.  - continue zoloft for anxiety and depression - Follow up with your primary care physician for stroke risk factor modification. Recommend maintain blood pressure goal <130/80, diabetes with hemoglobin A1c goal below 7.0% and lipids with LDL cholesterol goal below 70 mg/dL.  - check BP at home and record.  - let us know if you would like to have second opinion from outside neurologist.  - continue loop recorder monitoring and so far no afib.  - follow up in 6 months.

## 2017-10-21 ENCOUNTER — Ambulatory Visit: Payer: PPO | Admitting: Physical Therapy

## 2017-10-21 ENCOUNTER — Telehealth: Payer: Self-pay | Admitting: Neurology

## 2017-10-21 DIAGNOSIS — Z8673 Personal history of transient ischemic attack (TIA), and cerebral infarction without residual deficits: Secondary | ICD-10-CM

## 2017-10-21 DIAGNOSIS — F444 Conversion disorder with motor symptom or deficit: Secondary | ICD-10-CM | POA: Insufficient documentation

## 2017-10-21 DIAGNOSIS — M6281 Muscle weakness (generalized): Secondary | ICD-10-CM | POA: Diagnosis not present

## 2017-10-21 DIAGNOSIS — R2689 Other abnormalities of gait and mobility: Secondary | ICD-10-CM

## 2017-10-21 DIAGNOSIS — R262 Difficulty in walking, not elsewhere classified: Secondary | ICD-10-CM

## 2017-10-21 DIAGNOSIS — F419 Anxiety disorder, unspecified: Secondary | ICD-10-CM

## 2017-10-21 NOTE — Telephone Encounter (Signed)
Pts husband called stating Dr. Erlinda Hong had mentioned being able to refer the pt elsewhere for a second option. Husband requesting referral sent to [facility]

## 2017-10-21 NOTE — Telephone Encounter (Signed)
Yes. Happy to do so.   Stacey Hawking, MD PhD Stroke Neurology 10/21/2017 1:06 PM    Orders Placed This Encounter  Procedures  . Ambulatory referral to Neurology    Referral Priority:   Routine    Referral Type:   Consultation    Referral Reason:   Second Opinion    Requested Specialty:   Neurology    Number of Visits Requested:   1

## 2017-10-21 NOTE — Therapy (Signed)
[facility] [place] [place], [place], [address] Phone: [phone]   Fax:  [phone]  Physical Therapy Treatment  Patient Details  Name: Stacey Ramirez MRN: [id number] Date of Birth: August 12, 1940 Referring Provider: Deland Pretty   Encounter Date: 10/21/2017  PT End of Session - 10/21/17 1457    Visit Number  15    Date for PT Re-Evaluation  10/22/17    PT Start Time  4332    PT Stop Time  1530    PT Time Calculation (min)  45 min       Past Medical History:  Diagnosis Date  . Adjustment reaction with anxiety 12/20/2015  . Aortic stenosis   . CAD (coronary artery disease) 06/09/2017   Nuc study 10/18: EF 69, inferolateral perfusion defect-possible small infarct with peri-infarct ischemia; intermediate risk // LHC 10/18: pLAD 25, pLCx 50, pRCA 25 >> med Rx  . Carotid stenosis, asymptomatic, bilateral 02/27/2016   Carotid US 5/17: Bilateral ICA 1-39  . Cerebrovascular accident (stroke) ([place])    a. 12/2015 - cryptogenic.  S/P MDT Linq.  . Difficulty walking   . Essential hypertension   . Gait disorder 04/07/2016  . Heart palpitations 09/06/2012  . History of CVA with residual deficit 12/14/2015  . History of dizziness   . History of echocardiogram    Echo 10/18: Vigorous LVF, EF 65-70, normal wall motion, grade 1 diastolic dysfunction, GLS -21.1%, mild RAE  . History of nuclear stress test    Nuclear stress test 10/18: EF 69, inf-lat defect c/w poss infarct with peri-infarct ischemia; Intermediate Risk  . Hyperlipidemia   . Hypothyroidism   . Left hemiparesis ([place])   . LVH (left ventricular hypertrophy)   . Mitral regurgitation    a. 12/2015 Echo: EF 60-65%, mild focal basal hypertrophy. No rwma, triv AI, mild MR, mildly dil LA w/o thrombus. No RA thrombus. No PFO.  Marland Kitchen Palpitations   . Prediabetes   . Stroke-like symptom 12/13/2015  . SVT (supraventricular tachycardia) ([place]) 12/14/2015  . Vascular parkinsonism ([place])  06/29/2016  . Vitamin D deficiency     Past Surgical History:  Procedure Laterality Date  . CARDIOVASCULAR STRESS TEST  2002   NORMAL  . EP IMPLANTABLE DEVICE N/A 12/17/2015   Procedure: Loop Recorder Insertion;  Surgeon: [name], MD;  Location: [facility];  Service: Cardiovascular;  Laterality: N/A;  . LEFT HEART CATH AND CORONARY ANGIOGRAPHY N/A 05/25/2017   Procedure: LEFT HEART CATH AND CORONARY ANGIOGRAPHY;  Surgeon: Sherren Mocha, MD;  Location: [place] [facility];  Service: Cardiovascular;  Laterality: N/A;  . LOOP RECORDER IMPLANT    . TEE WITHOUT CARDIOVERSION N/A 12/17/2015   Procedure: TRANSESOPHAGEAL ECHOCARDIOGRAM (TEE);  Surgeon: Lelon Perla, MD;  Location: [facility];  Service: Cardiovascular;  Laterality: N/A;  Pt also needs a LOOP  . TRANSTHORACIC ECHOCARDIOGRAM  2008   SHOWED LEFT VENTRICULAR HYPERTROPHY AND MILD AORTIC STENOSIS    There were no vitals filed for this visit.  Subjective Assessment - 10/21/17 1450    Subjective  MD felt "episode" was stress related. walked better after last session but still not good    Currently in Pain?  No/denies                      [facility] Adult PT Treatment/Exercise - 10/21/17 0001      Ambulation/Gait   Gait Comments  gait working in increased stride and picking  up leg      High Level Balance   High Level Balance Activities  Direction changes;Marching forwards;Negotiating over obstacles;Negotitating around obstacles ball toss      Knee/Hip Exercises: Aerobic   Recumbent Bike  6 min    Nustep  L5 x 7 mins      Knee/Hip Exercises: Machines for Strengthening   Cybex Knee Extension  10# 3 sets 10    Cybex Knee Flexion  20# 3 sets 10      Knee/Hip Exercises: Standing   Other Standing Knee Exercises  marching fwd and back    Other Standing Knee Exercises  HHA alt step tap               PT Short Term Goals - 09/02/17 1435      PT SHORT TERM GOAL #1   Title  independent with  her home HEP    Time  2    Period  Weeks    Status  On-going        PT Long Term Goals - 10/21/17 1459      PT LONG TERM GOAL #2   Title  increase Berg balance score to 46/56 for safety    Baseline  did not test d/t set back    Status  Partially Met      PT LONG TERM GOAL #3   Title  increase LE strength (particularly ABD and ext.) to 4/5 for functional gait and safety    Status  Partially Met            Plan - 10/21/17 1458    Clinical Impression Statement  pt still very hesitant with mvmt  and relies on grabbing for stability. left foot dragging, narrow BOS and crossing midline with left stepping. no goal progress this week.     PT Next Visit Plan  will renew for add'l month to try to get pt back to baseline and transition to gym program       Patient will benefit from skilled therapeutic intervention in order to improve the following deficits and impairments:  Abnormal gait, Decreased strength, Decreased balance, Difficulty walking, Decreased activity tolerance, Decreased coordination, Decreased mobility  Visit Diagnosis: Muscle weakness (generalized)  Difficulty in walking, not elsewhere classified  Balance problem  Anxiety     Problem List Patient Active Problem List   Diagnosis Date Noted  . CAD (coronary artery disease) 06/09/2017  . Abnormal nuclear cardiac imaging test 05/25/2017  . PVC's (premature ventricular contractions) 05/17/2017  . Anxiety 12/29/2016  . Vascular parkinsonism ([place]) 06/29/2016  . Gait disorder 04/07/2016  . Carotid stenosis, asymptomatic, bilateral 02/27/2016  . Left-sided weakness 12/28/2015  . Headache 12/28/2015  . Adjustment reaction with anxiety 12/20/2015  . Left hemiparesis ([place])   . History of CVA with residual deficit   . Prediabetes   . SVT (supraventricular tachycardia) ([place]) 12/14/2015  . Stroke-like symptom 12/13/2015  . Heart palpitations 09/06/2012  . Hypertension   . Hyperlipidemia   . Hypothyroidism    . Vitamin D deficiency   . Palpitations   . History of dizziness   . LVH (left ventricular hypertrophy)   . Aortic stenosis   . Mitral regurgitation   . SOB (shortness of breath)   . Difficulty walking     PAYSEUR,ANGIE PTA 10/21/2017, 3:20 PM  [facility] [place] [address] [address] [place], [place], [address] Phone: [phone]   Fax:  [phone]  Name: Stacey Ramirez  Bianca MRN: [id number] Date of Birth: 31-Dec-1940

## 2017-10-26 ENCOUNTER — Ambulatory Visit: Payer: PPO | Admitting: Physical Therapy

## 2017-10-26 DIAGNOSIS — M6281 Muscle weakness (generalized): Secondary | ICD-10-CM

## 2017-10-26 DIAGNOSIS — R262 Difficulty in walking, not elsewhere classified: Secondary | ICD-10-CM

## 2017-10-26 NOTE — Therapy (Signed)
[place], [place], [address] Phone: [phone]   Fax:  [phone]  Physical Therapy Treatment  Patient Details  Name: Stacey Ramirez MRN: [id number] Date of Birth: 1940/11/24 Referring Provider: Deland Pretty   Encounter Date: 10/26/2017  PT End of Session - 10/26/17 1411    Visit Number  16    Date for PT Re-Evaluation  11/23/17    PT Start Time  1315    PT Stop Time  1400    PT Time Calculation (min)  45 min       Past Medical History:  Diagnosis Date  . Adjustment reaction with anxiety 12/20/2015  . Aortic stenosis   . CAD (coronary artery disease) 06/09/2017   Nuc study 10/18: EF 69, inferolateral perfusion defect-possible small infarct with peri-infarct ischemia; intermediate risk // LHC 10/18: pLAD 25, pLCx 50, pRCA 25 >> med Rx  . Carotid stenosis, asymptomatic, bilateral 02/27/2016   Carotid US 5/17: Bilateral ICA 1-39  . Cerebrovascular accident (stroke) ([place])    a. 12/2015 - cryptogenic.  S/P MDT Linq.  . Difficulty walking   . Essential hypertension   . Gait disorder 04/07/2016  . Heart palpitations 09/06/2012  . History of CVA with residual deficit 12/14/2015  . History of dizziness   . History of echocardiogram    Echo 10/18: Vigorous LVF, EF 65-70, normal wall motion, grade 1 diastolic dysfunction, GLS -21.1%, mild RAE  . History of nuclear stress test    Nuclear stress test 10/18: EF 69, inf-lat defect c/w poss infarct with peri-infarct ischemia; Intermediate Risk  . Hyperlipidemia   . Hypothyroidism   . Left hemiparesis ([place])   . LVH (left ventricular hypertrophy)   . Mitral regurgitation    a. 12/2015 Echo: EF 60-65%, mild focal basal hypertrophy. No rwma, triv AI, mild MR, mildly dil LA w/o thrombus. No RA thrombus. No PFO.  Marland Kitchen Palpitations   . Prediabetes   . Stroke-like symptom 12/13/2015  . SVT (supraventricular tachycardia) ([place]) 12/14/2015  . Vascular parkinsonism ([place])  06/29/2016  . Vitamin D deficiency     Past Surgical History:  Procedure Laterality Date  . CARDIOVASCULAR STRESS TEST  2002   NORMAL  . EP IMPLANTABLE DEVICE N/A 12/17/2015   Procedure: Loop Recorder Insertion;  Surgeon: [name], MD;  Location: [facility];  Service: Cardiovascular;  Laterality: N/A;  . LEFT HEART CATH AND CORONARY ANGIOGRAPHY N/A 05/25/2017   Procedure: LEFT HEART CATH AND CORONARY ANGIOGRAPHY;  Surgeon: Sherren Mocha, MD;  Location: [facility];  Service: Cardiovascular;  Laterality: N/A;  . LOOP RECORDER IMPLANT    . TEE WITHOUT CARDIOVERSION N/A 12/17/2015   Procedure: TRANSESOPHAGEAL ECHOCARDIOGRAM (TEE);  Surgeon: Lelon Perla, MD;  Location: [facility] ENDOSCOPY;  Service: Cardiovascular;  Laterality: N/A;  Pt also needs a LOOP  . TRANSTHORACIC ECHOCARDIOGRAM  2008   SHOWED LEFT VENTRICULAR HYPERTROPHY AND MILD AORTIC STENOSIS    There were no vitals filed for this visit.  Subjective Assessment - 10/26/17 1317    Subjective  still not walking great and afraid. knees sore and popping .[facility] getting all my records for second opinion    Currently in Pain?  Yes    Pain Score  3     Pain Location  Knee    Pain Orientation  Right;Left                No data recorded  [facility] Adult PT Treatment/Exercise - 10/26/17 0001      Ambulation/Gait   Gait Comments  gait around building working on cadeanace and stride,left foot drags at time and turns are difficult      High Level Balance   High Level Balance Activities  Negotiating over obstacles;Negotitating around obstacles;Direction changes;Backward walking;Side stepping;Turns;Figure 8 turns airex ball toss      Knee/Hip Exercises: Aerobic   Recumbent Bike  6 min    Nustep  L5 x 7 mins      Knee/Hip Exercises: Standing   Other Standing Knee Exercises  alt taps at various height with turns               PT Short Term Goals - 09/02/17 1435      PT SHORT TERM  GOAL #1   Title  independent with her home HEP    Time  2    Period  Weeks    Status  On-going        PT Long Term Goals - 10/21/17 1459      PT LONG TERM GOAL #2   Title  increase Berg balance score to 46/56 for safety    Baseline  did not test d/t set back    Status  Partially Met      PT LONG TERM GOAL #3   Title  increase LE strength (particularly ABD and ext.) to 4/5 for functional gait and safety    Status  Partially Met            Plan - 10/26/17 1411    Clinical Impression Statement  increased fatigue today with walking and left leg drags and at times causes sudden stop and LOB. pt with difficulty with turns and fearful of walking without assistance. some left sided neglect noted with ball toss and difficulty picking up left leg consistantly to clear box/obstacle- pt requiring assistance and cuing with most all activities    PT Treatment/Interventions  Stair training;Gait training;Functional mobility training;Therapeutic activities;Therapeutic exercise;Balance training;Patient/family education    PT Next Visit Plan  work on gait and balance to get back to baseline       Patient will benefit from skilled therapeutic intervention in order to improve the following deficits and impairments:  Abnormal gait, Decreased strength, Decreased balance, Difficulty walking, Decreased activity tolerance, Decreased coordination, Decreased mobility  Visit Diagnosis: Muscle weakness (generalized)  Difficulty in walking, not elsewhere classified     Problem List Patient Active Problem List   Diagnosis Date Noted  . Psychogenic gait 10/21/2017  . CAD (coronary artery disease) 06/09/2017  . Abnormal nuclear cardiac imaging test 05/25/2017  . PVC's (premature ventricular contractions) 05/17/2017  . Anxiety 12/29/2016  . Vascular parkinsonism ([place]) 06/29/2016  . Gait disorder 04/07/2016  . Intracranial carotid stenosis, bilateral 02/27/2016  . Essential hypertension   .  Left-sided weakness 12/28/2015  . Headache 12/28/2015  . Adjustment reaction with anxiety 12/20/2015  . Left hemiparesis ([place])   . History of CVA with residual deficit   . Prediabetes   . SVT (supraventricular tachycardia) ([place]) 12/14/2015  . Stroke-like symptom 12/13/2015  . History of stroke   . Heart palpitations 09/06/2012  . Hypertension   . Hyperlipidemia   . Hypothyroidism   . Vitamin D deficiency   . Palpitations   . History of dizziness   . LVH (left ventricular hypertrophy)   . Aortic stenosis   . Mitral regurgitation   . SOB (shortness of breath)   . Difficulty  walking     Vaniya Augspurger,ANGIE PTA 10/26/2017, 2:14 PM  [place], [place], [address] Phone: [phone]   Fax:  [phone]  Name: Stacey Ramirez MRN: [id number] Date of Birth: 07-18-1941

## 2017-10-28 ENCOUNTER — Encounter: Payer: Self-pay | Admitting: Physical Therapy

## 2017-10-28 ENCOUNTER — Ambulatory Visit: Payer: PPO | Admitting: Physical Therapy

## 2017-10-28 DIAGNOSIS — M6281 Muscle weakness (generalized): Secondary | ICD-10-CM | POA: Diagnosis not present

## 2017-10-28 DIAGNOSIS — R262 Difficulty in walking, not elsewhere classified: Secondary | ICD-10-CM

## 2017-10-28 DIAGNOSIS — R2689 Other abnormalities of gait and mobility: Secondary | ICD-10-CM

## 2017-10-28 DIAGNOSIS — I63411 Cerebral infarction due to embolism of right middle cerebral artery: Secondary | ICD-10-CM

## 2017-10-28 DIAGNOSIS — G8194 Hemiplegia, unspecified affecting left nondominant side: Secondary | ICD-10-CM

## 2017-10-28 DIAGNOSIS — F419 Anxiety disorder, unspecified: Secondary | ICD-10-CM

## 2017-10-28 NOTE — Therapy (Signed)
[place], [place], [address] Phone: [phone]   Fax:  [phone]  Physical Therapy Treatment  Patient Details  Name: Stacey Ramirez MRN: [id number] Date of Birth: 1941/01/30 Referring Provider: Deland Pretty   Encounter Date: 10/28/2017  PT End of Session - 10/28/17 1256    Visit Number  17    Date for PT Re-Evaluation  11/23/17    PT Start Time  1245    PT Stop Time  1330    PT Time Calculation (min)  45 min    Activity Tolerance  Patient tolerated treatment well    Behavior During Therapy  [facility] for tasks assessed/performed       Past Medical History:  Diagnosis Date  . Adjustment reaction with anxiety 12/20/2015  . Aortic stenosis   . CAD (coronary artery disease) 06/09/2017   Nuc study 10/18: EF 69, inferolateral perfusion defect-possible small infarct with peri-infarct ischemia; intermediate risk // LHC 10/18: pLAD 25, pLCx 50, pRCA 25 >> med Rx  . Carotid stenosis, asymptomatic, bilateral 02/27/2016   Carotid US 5/17: Bilateral ICA 1-39  . Cerebrovascular accident (stroke) ([place])    a. 12/2015 - cryptogenic.  S/P MDT Linq.  . Difficulty walking   . Essential hypertension   . Gait disorder 04/07/2016  . Heart palpitations 09/06/2012  . History of CVA with residual deficit 12/14/2015  . History of dizziness   . History of echocardiogram    Echo 10/18: Vigorous LVF, EF 65-70, normal wall motion, grade 1 diastolic dysfunction, GLS -21.1%, mild RAE  . History of nuclear stress test    Nuclear stress test 10/18: EF 69, inf-lat defect c/w poss infarct with peri-infarct ischemia; Intermediate Risk  . Hyperlipidemia   . Hypothyroidism   . Left hemiparesis ([place])   . LVH (left ventricular hypertrophy)   . Mitral regurgitation    a. 12/2015 Echo: EF 60-65%, mild focal basal hypertrophy. No rwma, triv AI, mild MR, mildly dil LA w/o thrombus. No RA thrombus. No PFO.  Marland Kitchen Palpitations   . Prediabetes    . Stroke-like symptom 12/13/2015  . SVT (supraventricular tachycardia) ([place]) 12/14/2015  . Vascular parkinsonism ([place]) 06/29/2016  . Vitamin D deficiency     Past Surgical History:  Procedure Laterality Date  . CARDIOVASCULAR STRESS TEST  2002   NORMAL  . EP IMPLANTABLE DEVICE N/A 12/17/2015   Procedure: Loop Recorder Insertion;  Surgeon: [name], MD;  Location: [facility];  Service: Cardiovascular;  Laterality: N/A;  . LEFT HEART CATH AND CORONARY ANGIOGRAPHY N/A 05/25/2017   Procedure: LEFT HEART CATH AND CORONARY ANGIOGRAPHY;  Surgeon: Sherren Mocha, MD;  Location: [facility];  Service: Cardiovascular;  Laterality: N/A;  . LOOP RECORDER IMPLANT    . TEE WITHOUT CARDIOVERSION N/A 12/17/2015   Procedure: TRANSESOPHAGEAL ECHOCARDIOGRAM (TEE);  Surgeon: Lelon Perla, MD;  Location: [facility] ENDOSCOPY;  Service: Cardiovascular;  Laterality: N/A;  Pt also needs a LOOP  . TRANSTHORACIC ECHOCARDIOGRAM  2008   SHOWED LEFT VENTRICULAR HYPERTROPHY AND MILD AORTIC STENOSIS    There were no vitals filed for this visit.  Subjective Assessment - 10/28/17 1254    Subjective  Pt still reporting difficulty with walking and she feels off balance.     Patient Stated Goals  being able to walk without being fearful and without having to use someone or an assistive device for stability    Currently in Pain?  Yes  Pain Score  3     Pain Location  Knee    Pain Orientation  Left;Right    Pain Descriptors / Indicators  Aching    Pain Type  Chronic pain    Pain Onset  More than a month ago         [facility] PT Assessment - 10/28/17 0001      Ambulation/Gait   Gait Comments  instructed in heel to toe gait pattern, performed side stepping x 50 feet each directions, backward walking with hand held assistance x 30 feet x 2 with bilateral hand held assistance, marching x 50 feet x 2 with single hand held assistance            No data recorded       [facility] Adult PT  Treatment/Exercise - 10/28/17 0001      Knee/Hip Exercises: Seated   Ball Squeeze   x 15 holding 5 seconds each    Clamshell with TheraBand  Red    Marching  Strengthening;Both;20 reps;Limitations    Marching Limitations  on airex disc    Sit to [employer]  10 reps;with UE support pt with 2 LOB posteriorly      Knee/Hip Exercises: Supine   Bridges  Strengthening;2 sets;10 reps feet on therapy ball    Straight Leg Raises  Strengthening;10 reps               PT Short Term Goals - 09/02/17 1435      PT SHORT TERM GOAL #1   Title  independent with her home HEP    Time  2    Period  Weeks    Status  On-going        PT Long Term Goals - 10/28/17 1309      PT LONG TERM GOAL #1   Title  decrease TUG time to 14 seconds for functional gait and safety    Status  Achieved      PT LONG TERM GOAL #2   Title  increase Berg balance score to 46/56 for safety    Baseline  did not test d/t set back    Period  Weeks    Status  Partially Met      PT LONG TERM GOAL #3   Title  increase LE strength (particularly ABD and ext.) to 4/5 for functional gait and safety    Baseline  4-/5 grossly tested in sitting    Period  Weeks    Status  Partially Met      PT LONG TERM GOAL #4   Title  be able to do a sit to stand without assistance and without losing balance for functional use    Baseline  distance meet but difficulty transitioning surfaces    Time  8    Period  Weeks    Status  Achieved            Plan - 10/28/17 1303    Clinical Impression Statement  Pt tolerating well, with rest breaks required. Pt with weakness noted in left LE. Pt also presenting with with balance deficits. Pt instructed in gait training for increased hip flexion and foot clearence. Continue skilled PT.     Rehab Potential  Good    PT Frequency  2x / week    PT Duration  8 weeks    PT Treatment/Interventions  Stair training;Gait training;Functional mobility training;Therapeutic activities;Therapeutic  exercise;Balance training;Patient/family education    PT Next Visit Plan  work on gait  and balance to get back to baseline    PT Home Exercise Plan  hip flex, ABD, ext. in standing with walker or kitchen counter    Consulted and Agree with Plan of Care  Patient       Patient will benefit from skilled therapeutic intervention in order to improve the following deficits and impairments:  Abnormal gait, Decreased strength, Decreased balance, Difficulty walking, Decreased activity tolerance, Decreased coordination, Decreased mobility  Visit Diagnosis: Muscle weakness (generalized)  Difficulty in walking, not elsewhere classified  Balance problem  Anxiety  Left hemiparesis (HCC)  Stroke due to embolism of right middle cerebral artery [facility])     Problem List Patient Active Problem List   Diagnosis Date Noted  . Psychogenic gait 10/21/2017  . CAD (coronary artery disease) 06/09/2017  . Abnormal nuclear cardiac imaging test 05/25/2017  . PVC's (premature ventricular contractions) 05/17/2017  . Anxiety 12/29/2016  . Vascular parkinsonism ([place]) 06/29/2016  . Gait disorder 04/07/2016  . Intracranial carotid stenosis, bilateral 02/27/2016  . Essential hypertension   . Left-sided weakness 12/28/2015  . Headache 12/28/2015  . Adjustment reaction with anxiety 12/20/2015  . Left hemiparesis ([place])   . History of CVA with residual deficit   . Prediabetes   . SVT (supraventricular tachycardia) ([place]) 12/14/2015  . Stroke-like symptom 12/13/2015  . History of stroke   . Heart palpitations 09/06/2012  . Hypertension   . Hyperlipidemia   . Hypothyroidism   . Vitamin D deficiency   . Palpitations   . History of dizziness   . LVH (left ventricular hypertrophy)   . Aortic stenosis   . Mitral regurgitation   . SOB (shortness of breath)   . Difficulty walking     Oretha Caprice, MPT 10/28/2017, 1:47 PM  [place] [address] [place], [place], [address] Phone: [phone]   Fax:  [phone]  Name: Stacey Ramirez MRN: [id number] Date of Birth: [birth year]

## 2017-11-02 ENCOUNTER — Ambulatory Visit: Payer: PPO | Attending: Internal Medicine | Admitting: Physical Therapy

## 2017-11-02 DIAGNOSIS — R2689 Other abnormalities of gait and mobility: Secondary | ICD-10-CM

## 2017-11-02 DIAGNOSIS — M6281 Muscle weakness (generalized): Secondary | ICD-10-CM

## 2017-11-02 DIAGNOSIS — R262 Difficulty in walking, not elsewhere classified: Secondary | ICD-10-CM | POA: Diagnosis not present

## 2017-11-02 NOTE — Therapy (Signed)
[place], [place], [address] Phone: [phone]   Fax:  [phone]  Physical Therapy Treatment  Patient Details  Name: Stacey Ramirez MRN: [id number] Date of Birth: 1941-04-19 Referring Provider: Deland Pretty   Encounter Date: 11/02/2017  PT End of Session - 11/02/17 1403    Visit Number  18    Date for PT Re-Evaluation  11/23/17    PT Start Time  1315    PT Stop Time  1400    PT Time Calculation (min)  45 min       Past Medical History:  Diagnosis Date  . Adjustment reaction with anxiety 12/20/2015  . Aortic stenosis   . CAD (coronary artery disease) 06/09/2017   Nuc study 10/18: EF 69, inferolateral perfusion defect-possible small infarct with peri-infarct ischemia; intermediate risk // LHC 10/18: pLAD 25, pLCx 50, pRCA 25 >> med Rx  . Carotid stenosis, asymptomatic, bilateral 02/27/2016   Carotid US 5/17: Bilateral ICA 1-39  . Cerebrovascular accident (stroke) ([place])    a. 12/2015 - cryptogenic.  S/P MDT Linq.  . Difficulty walking   . Essential hypertension   . Gait disorder 04/07/2016  . Heart palpitations 09/06/2012  . History of CVA with residual deficit 12/14/2015  . History of dizziness   . History of echocardiogram    Echo 10/18: Vigorous LVF, EF 65-70, normal wall motion, grade 1 diastolic dysfunction, GLS -21.1%, mild RAE  . History of nuclear stress test    Nuclear stress test 10/18: EF 69, inf-lat defect c/w poss infarct with peri-infarct ischemia; Intermediate Risk  . Hyperlipidemia   . Hypothyroidism   . Left hemiparesis ([place])   . LVH (left ventricular hypertrophy)   . Mitral regurgitation    a. 12/2015 Echo: EF 60-65%, mild focal basal hypertrophy. No rwma, triv AI, mild MR, mildly dil LA w/o thrombus. No RA thrombus. No PFO.  Marland Kitchen Palpitations   . Prediabetes   . Stroke-like symptom 12/13/2015  . SVT (supraventricular tachycardia) ([place]) 12/14/2015  . Vascular parkinsonism ([place])  06/29/2016  . Vitamin D deficiency     Past Surgical History:  Procedure Laterality Date  . CARDIOVASCULAR STRESS TEST  2002   NORMAL  . EP IMPLANTABLE DEVICE N/A 12/17/2015   Procedure: Loop Recorder Insertion;  Surgeon: [name], MD;  Location: [facility];  Service: Cardiovascular;  Laterality: N/A;  . LEFT HEART CATH AND CORONARY ANGIOGRAPHY N/A 05/25/2017   Procedure: LEFT HEART CATH AND CORONARY ANGIOGRAPHY;  Surgeon: Sherren Mocha, MD;  Location: [facility];  Service: Cardiovascular;  Laterality: N/A;  . LOOP RECORDER IMPLANT    . TEE WITHOUT CARDIOVERSION N/A 12/17/2015   Procedure: TRANSESOPHAGEAL ECHOCARDIOGRAM (TEE);  Surgeon: Lelon Perla, MD;  Location: [facility] ENDOSCOPY;  Service: Cardiovascular;  Laterality: N/A;  Pt also needs a LOOP  . TRANSTHORACIC ECHOCARDIOGRAM  2008   SHOWED LEFT VENTRICULAR HYPERTROPHY AND MILD AORTIC STENOSIS    There were no vitals filed for this visit.  Subjective Assessment - 11/02/17 1316    Subjective  very slow and having to hold on all the time, almost fell fwd this morning. Going to [facility] tomorrow    Currently in Pain?  No/denies                       [facility] Adult PT Treatment/Exercise - 11/02/17 0001      Ambulation/Gait   Gait Comments  working  on gait- increased stride,cadeance and fluidity of gait       High Level Balance   High Level Balance Activities  Side stepping;Backward walking;Marching forwards;Negotitating around obstacles;Negotiating over obstacles      Knee/Hip Exercises: Aerobic   Nustep  L5 x 7 mins      Knee/Hip Exercises: Machines for Strengthening   Cybex Knee Extension  10# 3 sets 10    Cybex Knee Flexion  20# 3 sets 10      Knee/Hip Exercises: Seated   Other Seated Knee/Hip Exercises  core stab ex on physioball               PT Short Term Goals - 09/02/17 1435      PT SHORT TERM GOAL #1   Title  independent with her home HEP    Time  2    Period   Weeks    Status  On-going        PT Long Term Goals - 10/28/17 1309      PT LONG TERM GOAL #1   Title  decrease TUG time to 14 seconds for functional gait and safety    Status  Achieved      PT LONG TERM GOAL #2   Title  increase Berg balance score to 46/56 for safety    Baseline  did not test d/t set back    Period  Weeks    Status  Partially Met      PT LONG TERM GOAL #3   Title  increase LE strength (particularly ABD and ext.) to 4/5 for functional gait and safety    Baseline  4-/5 grossly tested in sitting    Period  Weeks    Status  Partially Met      PT LONG TERM GOAL #4   Title  be able to do a sit to stand without assistance and without losing balance for functional use    Baseline  distance meet but difficulty transitioning surfaces    Time  8    Period  Weeks    Status  Achieved            Plan - 11/02/17 1404    Clinical Impression Statement  pt still slow with walkig and turns with decreased left LE advancement. increased fear of falling and moving from midline. assistance with cuing needed throughout session    PT Treatment/Interventions  Stair training;Gait training;Functional mobility training;Therapeutic activities;Therapeutic exercise;Balance training;Patient/family education    PT Next Visit Plan  work on gait and balance to get back to baseline- assess WF visit       Patient will benefit from skilled therapeutic intervention in order to improve the following deficits and impairments:  Abnormal gait, Decreased strength, Decreased balance, Difficulty walking, Decreased activity tolerance, Decreased coordination, Decreased mobility  Visit Diagnosis: Muscle weakness (generalized)  Difficulty in walking, not elsewhere classified  Balance problem     Problem List Patient Active Problem List   Diagnosis Date Noted  . Psychogenic gait 10/21/2017  . CAD (coronary artery disease) 06/09/2017  . Abnormal nuclear cardiac imaging test 05/25/2017  .  PVC's (premature ventricular contractions) 05/17/2017  . Anxiety 12/29/2016  . Vascular parkinsonism ([place]) 06/29/2016  . Gait disorder 04/07/2016  . Intracranial carotid stenosis, bilateral 02/27/2016  . Essential hypertension   . Left-sided weakness 12/28/2015  . Headache 12/28/2015  . Adjustment reaction with anxiety 12/20/2015  . Left hemiparesis ([place])   . History of CVA with residual deficit   .  Prediabetes   . SVT (supraventricular tachycardia) ([place]) 12/14/2015  . Stroke-like symptom 12/13/2015  . History of stroke   . Heart palpitations 09/06/2012  . Hypertension   . Hyperlipidemia   . Hypothyroidism   . Vitamin D deficiency   . Palpitations   . History of dizziness   . LVH (left ventricular hypertrophy)   . Aortic stenosis   . Mitral regurgitation   . SOB (shortness of breath)   . Difficulty walking     Kayde Atkerson,ANGIE PTA 11/02/2017, 2:05 PM  [place], [place], [address] Phone: [phone]   Fax:  [phone]  Name: DARLA MCDONALD MRN: [id number] Date of Birth: Jul 18, 1941

## 2017-11-03 ENCOUNTER — Ambulatory Visit: Payer: PPO | Admitting: Neurology

## 2017-11-03 DIAGNOSIS — R269 Unspecified abnormalities of gait and mobility: Secondary | ICD-10-CM | POA: Diagnosis not present

## 2017-11-03 DIAGNOSIS — I69354 Hemiplegia and hemiparesis following cerebral infarction affecting left non-dominant side: Secondary | ICD-10-CM | POA: Diagnosis not present

## 2017-11-04 ENCOUNTER — Ambulatory Visit: Payer: PPO | Admitting: Physical Therapy

## 2017-11-04 DIAGNOSIS — M6281 Muscle weakness (generalized): Secondary | ICD-10-CM

## 2017-11-04 DIAGNOSIS — R2689 Other abnormalities of gait and mobility: Secondary | ICD-10-CM

## 2017-11-04 DIAGNOSIS — R262 Difficulty in walking, not elsewhere classified: Secondary | ICD-10-CM

## 2017-11-04 NOTE — Therapy (Signed)
[place], [place], [address] Phone: [phone]   Fax:  [phone]  Physical Therapy Treatment  Patient Details  Name: Stacey Ramirez MRN: [id number] Date of Birth: January 09, 1941 Referring Provider: Deland Pretty   Encounter Date: 11/04/2017  PT End of Session - 11/04/17 1553    Visit Number  19    Date for PT Re-Evaluation  11/23/17    PT Start Time  1638    PT Stop Time  1540    PT Time Calculation (min)  55 min       Past Medical History:  Diagnosis Date  . Adjustment reaction with anxiety 12/20/2015  . Aortic stenosis   . CAD (coronary artery disease) 06/09/2017   Nuc study 10/18: EF 69, inferolateral perfusion defect-possible small infarct with peri-infarct ischemia; intermediate risk // LHC 10/18: pLAD 25, pLCx 50, pRCA 25 >> med Rx  . Carotid stenosis, asymptomatic, bilateral 02/27/2016   Carotid US 5/17: Bilateral ICA 1-39  . Cerebrovascular accident (stroke) ([place])    a. 12/2015 - cryptogenic.  S/P MDT Linq.  . Difficulty walking   . Essential hypertension   . Gait disorder 04/07/2016  . Heart palpitations 09/06/2012  . History of CVA with residual deficit 12/14/2015  . History of dizziness   . History of echocardiogram    Echo 10/18: Vigorous LVF, EF 65-70, normal wall motion, grade 1 diastolic dysfunction, GLS -21.1%, mild RAE  . History of nuclear stress test    Nuclear stress test 10/18: EF 69, inf-lat defect c/w poss infarct with peri-infarct ischemia; Intermediate Risk  . Hyperlipidemia   . Hypothyroidism   . Left hemiparesis ([place])   . LVH (left ventricular hypertrophy)   . Mitral regurgitation    a. 12/2015 Echo: EF 60-65%, mild focal basal hypertrophy. No rwma, triv AI, mild MR, mildly dil LA w/o thrombus. No RA thrombus. No PFO.  Marland Kitchen Palpitations   . Prediabetes   . Stroke-like symptom 12/13/2015  . SVT (supraventricular tachycardia) ([place]) 12/14/2015  . Vascular parkinsonism ([place])  06/29/2016  . Vitamin D deficiency     Past Surgical History:  Procedure Laterality Date  . CARDIOVASCULAR STRESS TEST  2002   NORMAL  . EP IMPLANTABLE DEVICE N/A 12/17/2015   Procedure: Loop Recorder Insertion;  Surgeon: [name], MD;  Location: [facility];  Service: Cardiovascular;  Laterality: N/A;  . LEFT HEART CATH AND CORONARY ANGIOGRAPHY N/A 05/25/2017   Procedure: LEFT HEART CATH AND CORONARY ANGIOGRAPHY;  Surgeon: Sherren Mocha, MD;  Location: [facility];  Service: Cardiovascular;  Laterality: N/A;  . LOOP RECORDER IMPLANT    . TEE WITHOUT CARDIOVERSION N/A 12/17/2015   Procedure: TRANSESOPHAGEAL ECHOCARDIOGRAM (TEE);  Surgeon: Lelon Perla, MD;  Location: [facility] ENDOSCOPY;  Service: Cardiovascular;  Laterality: N/A;  Pt also needs a LOOP  . TRANSTHORACIC ECHOCARDIOGRAM  2008   SHOWED LEFT VENTRICULAR HYPERTROPHY AND MILD AORTIC STENOSIS    There were no vitals filed for this visit.  Subjective Assessment - 11/04/17 1455    Subjective  very disappointed with [facility] appt - they said it sin my head. i am kind of getting depressed    Currently in Pain?  No/denies                       [facility] Adult PT Treatment/Exercise - 11/04/17 0001      Ambulation/Gait   Gait Comments  Gait around building  cuing needed for larger step length and relax left hand.      High Level Balance   High Level Balance Activities  Other (comment)    High Level Balance Comments  -- sitting on airrex pad ball toss, reaching for ball      Knee/Hip Exercises: Aerobic   Nustep  L5 x 7 mins      Knee/Hip Exercises: Machines for Strengthening   Cybex Knee Extension  10# 2 sets 10    Cybex Knee Flexion  20# 2sets 10      Knee/Hip Exercises: Standing   Other Standing Knee Exercises  step over foam roller, side step and forward march with 2lb ankle weights      Knee/Hip Exercises: Seated   Ball Squeeze  2x10 sitting on airex pad    Clamshell with TheraBand  Red sitting  on airex pas    Marching  1 set sitting on airex pad               PT Short Term Goals - 09/02/17 1435      PT SHORT TERM GOAL #1   Title  independent with her home HEP    Time  2    Period  Weeks    Status  On-going        PT Long Term Goals - 10/28/17 1309      PT LONG TERM GOAL #1   Title  decrease TUG time to 14 seconds for functional gait and safety    Status  Achieved      PT LONG TERM GOAL #2   Title  increase Berg balance score to 46/56 for safety    Baseline  did not test d/t set back    Period  Weeks    Status  Partially Met      PT LONG TERM GOAL #3   Title  increase LE strength (particularly ABD and ext.) to 4/5 for functional gait and safety    Baseline  4-/5 grossly tested in sitting    Period  Weeks    Status  Partially Met      PT LONG TERM GOAL #4   Title  be able to do a sit to stand without assistance and without losing balance for functional use    Baseline  distance meet but difficulty transitioning surfaces    Time  8    Period  Weeks    Status  Achieved            Plan - 11/04/17 1554    Clinical Impression Statement  Pt seemed frustrated during therapy because of a prior doctors appointment. Pt has increased step length but needed cuing to increase reciprocal arm swing and to relax left hand. She continues to have dfficulty with turns.    PT Treatment/Interventions  Stair training;Gait training;Functional mobility training;Therapeutic activities;Therapeutic exercise;Balance training;Patient/family education    PT Next Visit Plan  pt out of town next week , assess goals       Patient will benefit from skilled therapeutic intervention in order to improve the following deficits and impairments:  Abnormal gait, Decreased strength, Decreased balance, Difficulty walking, Decreased activity tolerance, Decreased coordination, Decreased mobility  Visit Diagnosis: Muscle weakness (generalized)  Difficulty in walking, not elsewhere  classified  Balance problem     Problem List Patient Active Problem List   Diagnosis Date Noted  . Psychogenic gait 10/21/2017  . CAD (coronary artery disease) 06/09/2017  . Abnormal nuclear cardiac imaging test  05/25/2017  . PVC's (premature ventricular contractions) 05/17/2017  . Anxiety 12/29/2016  . Vascular parkinsonism ([place]) 06/29/2016  . Gait disorder 04/07/2016  . Intracranial carotid stenosis, bilateral 02/27/2016  . Essential hypertension   . Left-sided weakness 12/28/2015  . Headache 12/28/2015  . Adjustment reaction with anxiety 12/20/2015  . Left hemiparesis ([place])   . History of CVA with residual deficit   . Prediabetes   . SVT (supraventricular tachycardia) ([place]) 12/14/2015  . Stroke-like symptom 12/13/2015  . History of stroke   . Heart palpitations 09/06/2012  . Hypertension   . Hyperlipidemia   . Hypothyroidism   . Vitamin D deficiency   . Palpitations   . History of dizziness   . LVH (left ventricular hypertrophy)   . Aortic stenosis   . Mitral regurgitation   . SOB (shortness of breath)   . Difficulty walking     Chayne Baumgart,ANGIE PTA 11/04/2017, 4:01 PM  [place], [place], [address] Phone: [phone]   Fax:  [phone]  Name: Stacey Ramirez MRN: [id number] Date of Birth: Nov 21, 1940

## 2017-11-09 ENCOUNTER — Ambulatory Visit: Payer: PPO | Admitting: Physical Therapy

## 2017-11-09 ENCOUNTER — Encounter: Payer: Self-pay | Admitting: Physical Therapy

## 2017-11-09 DIAGNOSIS — M6281 Muscle weakness (generalized): Secondary | ICD-10-CM

## 2017-11-09 DIAGNOSIS — R262 Difficulty in walking, not elsewhere classified: Secondary | ICD-10-CM

## 2017-11-09 DIAGNOSIS — R2689 Other abnormalities of gait and mobility: Secondary | ICD-10-CM

## 2017-11-09 NOTE — Therapy (Signed)
[place], [place], [address] Phone: [phone]   Fax:  [phone]  Physical Therapy Treatment This note covers the period from visit 11-20. Patient Details  Name: Stacey Ramirez MRN: [id number] Date of Birth: 1940-08-13 Referring Provider: Deland Pretty   Encounter Date: 11/09/2017  PT End of Session - 11/09/17 1358    Visit Number  20    Date for PT Re-Evaluation  11/23/17    PT Start Time  1315    PT Stop Time  1410    PT Time Calculation (min)  55 min       Past Medical History:  Diagnosis Date  . Adjustment reaction with anxiety 12/20/2015  . Aortic stenosis   . CAD (coronary artery disease) 06/09/2017   Nuc study 10/18: EF 69, inferolateral perfusion defect-possible small infarct with peri-infarct ischemia; intermediate risk // LHC 10/18: pLAD 25, pLCx 50, pRCA 25 >> med Rx  . Carotid stenosis, asymptomatic, bilateral 02/27/2016   Carotid US 5/17: Bilateral ICA 1-39  . Cerebrovascular accident (stroke) ([place])    a. 12/2015 - cryptogenic.  S/P MDT Linq.  . Difficulty walking   . Essential hypertension   . Gait disorder 04/07/2016  . Heart palpitations 09/06/2012  . History of CVA with residual deficit 12/14/2015  . History of dizziness   . History of echocardiogram    Echo 10/18: Vigorous LVF, EF 65-70, normal wall motion, grade 1 diastolic dysfunction, GLS -21.1%, mild RAE  . History of nuclear stress test    Nuclear stress test 10/18: EF 69, inf-lat defect c/w poss infarct with peri-infarct ischemia; Intermediate Risk  . Hyperlipidemia   . Hypothyroidism   . Left hemiparesis ([employer])   . LVH (left ventricular hypertrophy)   . Mitral regurgitation    a. 12/2015 Echo: EF 60-65%, mild focal basal hypertrophy. No rwma, triv AI, mild MR, mildly dil LA w/o thrombus. No RA thrombus. No PFO.  Marland Kitchen Palpitations   . Prediabetes   . Stroke-like symptom 12/13/2015  . SVT (supraventricular tachycardia)  ([place]) 12/14/2015  . Vascular parkinsonism ([place]) 06/29/2016  . Vitamin D deficiency     Past Surgical History:  Procedure Laterality Date  . CARDIOVASCULAR STRESS TEST  2002   NORMAL  . EP IMPLANTABLE DEVICE N/A 12/17/2015   Procedure: Loop Recorder Insertion;  Surgeon: [name], MD;  Location: [facility];  Service: Cardiovascular;  Laterality: N/A;  . LEFT HEART CATH AND CORONARY ANGIOGRAPHY N/A 05/25/2017   Procedure: LEFT HEART CATH AND CORONARY ANGIOGRAPHY;  Surgeon: Sherren Mocha, MD;  Location: [place] [facility];  Service: Cardiovascular;  Laterality: N/A;  . LOOP RECORDER IMPLANT    . TEE WITHOUT CARDIOVERSION N/A 12/17/2015   Procedure: TRANSESOPHAGEAL ECHOCARDIOGRAM (TEE);  Surgeon: Lelon Perla, MD;  Location: [facility] ENDOSCOPY;  Service: Cardiovascular;  Laterality: N/A;  Pt also needs a LOOP  . TRANSTHORACIC ECHOCARDIOGRAM  2008   SHOWED LEFT VENTRICULAR HYPERTROPHY AND MILD AORTIC STENOSIS    There were no vitals filed for this visit.  Subjective Assessment - 11/09/17 1320    Subjective  foggy in my head. someone else at [facility] is going to see me    Currently in Pain?  No/denies                       [facility] Adult PT Treatment/Exercise - 11/09/17 0001      Ambulation/Gait   Gait Comments  Gait  around building cuing needed for larger step length  on left esp with fatigue.Fearful neg curb and assistance needed as will as  fwd flexed trunk and fatigue with incline      Berg Balance Test   Sit to Stand  Able to stand without using hands and stabilize independently    Standing Unsupported  Able to stand 2 minutes with supervision    Sitting with Back Unsupported but Feet Supported on Floor or Stool  Able to sit safely and securely 2 minutes    Stand to Sit  Controls descent by using hands    Transfers  Able to transfer safely, definite need of hands    Standing Unsupported with Eyes Closed  Able to stand 3 seconds    Standing Ubsupported with Feet  Together  Needs help to attain position but able to stand for 30 seconds with feet together    From Standing, Reach Forward with Outstretched Arm  Reaches forward but needs supervision    From Standing Position, Pick up Object from Floor  Unable to pick up and needs supervision    From Standing Position, Turn to Look Behind Over each Shoulder  Turn sideways only but maintains balance    Turn 360 Degrees  Needs close supervision or verbal cueing    Standing Unsupported, Alternately Place Feet on Step/Stool  Able to complete >2 steps/needs minimal assist    Standing Unsupported, One Foot in [employer] balance while stepping or standing    Standing on One Leg  Unable to try or needs assist to prevent fall    Total Score  26      High Level Balance   High Level Balance Activities  Marching forwards;Marching backwards;Side stepping HHA 2#    High Level Balance Comments  step over foam roll 2# HHA ,very fatigued on left so took off weight      Knee/Hip Exercises: Aerobic   Nustep  L5 x 7 mins      Knee/Hip Exercises: Machines for Strengthening   Cybex Knee Extension  10# 2 sets 10    Cybex Knee Flexion  20# 2sets 10               PT Short Term Goals - 09/02/17 1435      PT SHORT TERM GOAL #1   Title  independent with her home HEP    Time  2    Period  Weeks    Status  On-going        PT Long Term Goals - 11/09/17 1358      PT LONG TERM GOAL #2   Title  increase Berg balance score to 46/56 for safety    Baseline  26/5 significant set back      PT LONG TERM GOAL #3   Title  increase LE strength (particularly ABD and ext.) to 4/5 for functional gait and safety    Baseline  4-/5 grossly tested in sitting    Status  On-going      PT LONG TERM GOAL #4   Title  be able to do a sit to stand without assistance and without losing balance for functional use            Plan - 11/09/17 1401    Clinical Impression Statement  BERG 26/56, significant decrease in balance in  last 3 weeks as well as gait decline-heavy reliance on husband and using walker at home. Decreased safety and ability to walk around building  as compared to 3 weeks ago.    PT Treatment/Interventions  Stair training;Gait training;Functional mobility training;Therapeutic activities;Therapeutic exercise;Balance training;Patient/family education    PT Next Visit Plan  pt out of town next week , assess when returns       Patient will benefit from skilled therapeutic intervention in order to improve the following deficits and impairments:  Abnormal gait, Decreased strength, Decreased balance, Difficulty walking, Decreased activity tolerance, Decreased coordination, Decreased mobility  Visit Diagnosis: Muscle weakness (generalized)  Difficulty in walking, not elsewhere classified  Balance problem     Problem List Patient Active Problem List   Diagnosis Date Noted  . Psychogenic gait 10/21/2017  . CAD (coronary artery disease) 06/09/2017  . Abnormal nuclear cardiac imaging test 05/25/2017  . PVC's (premature ventricular contractions) 05/17/2017  . Anxiety 12/29/2016  . Vascular parkinsonism ([place]) 06/29/2016  . Gait disorder 04/07/2016  . Intracranial carotid stenosis, bilateral 02/27/2016  . Essential hypertension   . Left-sided weakness 12/28/2015  . Headache 12/28/2015  . Adjustment reaction with anxiety 12/20/2015  . Left hemiparesis ([place])   . History of CVA with residual deficit   . Prediabetes   . SVT (supraventricular tachycardia) ([place]) 12/14/2015  . Stroke-like symptom 12/13/2015  . History of stroke   . Heart palpitations 09/06/2012  . Hypertension   . Hyperlipidemia   . Hypothyroidism   . Vitamin D deficiency   . Palpitations   . History of dizziness   . LVH (left ventricular hypertrophy)   . Aortic stenosis   . Mitral regurgitation   . SOB (shortness of breath)   . Difficulty walking     PAYSEUR,ANGIE PTA 11/09/2017, 2:05 PM [place] [place] [address] [place], [place], [address] Phone: [phone]   Fax:  [phone]  Name: Stacey Ramirez MRN: [id number] Date of Birth: Jul 24, 1941

## 2017-11-11 ENCOUNTER — Ambulatory Visit: Payer: PPO | Admitting: Physical Therapy

## 2017-11-15 ENCOUNTER — Ambulatory Visit (INDEPENDENT_AMBULATORY_CARE_PROVIDER_SITE_OTHER): Payer: PPO | Admitting: *Deleted

## 2017-11-15 DIAGNOSIS — I639 Cerebral infarction, unspecified: Secondary | ICD-10-CM | POA: Diagnosis not present

## 2017-11-15 NOTE — Progress Notes (Signed)
Carelink Summary Report / Loop Recorder 

## 2017-11-16 ENCOUNTER — Ambulatory Visit: Payer: PPO | Admitting: Physical Therapy

## 2017-11-16 ENCOUNTER — Encounter: Payer: Self-pay | Admitting: Physical Therapy

## 2017-11-16 DIAGNOSIS — R262 Difficulty in walking, not elsewhere classified: Secondary | ICD-10-CM

## 2017-11-16 DIAGNOSIS — M6281 Muscle weakness (generalized): Secondary | ICD-10-CM | POA: Diagnosis not present

## 2017-11-16 DIAGNOSIS — R2689 Other abnormalities of gait and mobility: Secondary | ICD-10-CM

## 2017-11-16 NOTE — Therapy (Signed)
[place], [place], [address] Phone: [phone]   Fax:  [phone]  Physical Therapy Treatment  Patient Details  Name: Stacey Ramirez MRN: [id number] Date of Birth: 05/27/1941 Referring Provider: Deland Pretty   Encounter Date: 11/16/2017  PT End of Session - 11/16/17 1448    Visit Number  21    Date for PT Re-Evaluation  11/23/17    PT Start Time  1406    PT Stop Time  1446    PT Time Calculation (min)  40 min    Activity Tolerance  Patient tolerated treatment well    Behavior During Therapy  [facility] for tasks assessed/performed       Past Medical History:  Diagnosis Date  . Adjustment reaction with anxiety 12/20/2015  . Aortic stenosis   . CAD (coronary artery disease) 06/09/2017   Nuc study 10/18: EF 69, inferolateral perfusion defect-possible small infarct with peri-infarct ischemia; intermediate risk // LHC 10/18: pLAD 25, pLCx 50, pRCA 25 >> med Rx  . Carotid stenosis, asymptomatic, bilateral 02/27/2016   Carotid [place] 5/17: Bilateral ICA 1-39  . Cerebrovascular accident (stroke) ([place])    a. 12/2015 - cryptogenic.  S/P MDT Linq.  . Difficulty walking   . Essential hypertension   . Gait disorder 04/07/2016  . Heart palpitations 09/06/2012  . History of CVA with residual deficit 12/14/2015  . History of dizziness   . History of echocardiogram    Echo 10/18: Vigorous LVF, EF 65-70, normal wall motion, grade 1 diastolic dysfunction, GLS -21.1%, mild RAE  . History of nuclear stress test    Nuclear stress test 10/18: EF 69, inf-lat defect c/w poss infarct with peri-infarct ischemia; Intermediate Risk  . Hyperlipidemia   . Hypothyroidism   . Left hemiparesis ([place])   . LVH (left ventricular hypertrophy)   . Mitral regurgitation    a. 12/2015 Echo: EF 60-65%, mild focal basal hypertrophy. No rwma, triv AI, mild MR, mildly dil LA w/o thrombus. No RA thrombus. No PFO.  Marland Kitchen Palpitations   . Prediabetes    . Stroke-like symptom 12/13/2015  . SVT (supraventricular tachycardia) ([place]) 12/14/2015  . Vascular parkinsonism ([place]) 06/29/2016  . Vitamin D deficiency     Past Surgical History:  Procedure Laterality Date  . CARDIOVASCULAR STRESS TEST  2002   NORMAL  . EP IMPLANTABLE DEVICE N/A 12/17/2015   Procedure: Loop Recorder Insertion;  Surgeon: [name], MD;  Location: [facility];  Service: Cardiovascular;  Laterality: N/A;  . LEFT HEART CATH AND CORONARY ANGIOGRAPHY N/A 05/25/2017   Procedure: LEFT HEART CATH AND CORONARY ANGIOGRAPHY;  Surgeon: Sherren Mocha, MD;  Location: [place] [facility];  Service: Cardiovascular;  Laterality: N/A;  . LOOP RECORDER IMPLANT    . TEE WITHOUT CARDIOVERSION N/A 12/17/2015   Procedure: TRANSESOPHAGEAL ECHOCARDIOGRAM (TEE);  Surgeon: Lelon Perla, MD;  Location: [facility] ENDOSCOPY;  Service: Cardiovascular;  Laterality: N/A;  Pt also needs a LOOP  . TRANSTHORACIC ECHOCARDIOGRAM  2008   SHOWED LEFT VENTRICULAR HYPERTROPHY AND MILD AORTIC STENOSIS    There were no vitals filed for this visit.  Subjective Assessment - 11/16/17 1405    Subjective  "I don't know how I am doing" pt reports that husband thinks she's doing better.     Currently in Pain?  No/denies                       [facility] Adult  PT Treatment/Exercise - 11/16/17 0001      High Level Balance   High Level Balance Activities  Negotitating around obstacles;Negotiating over obstacles    High Level Balance Comments  -- obstacle course 3x      Knee/Hip Exercises: Aerobic   Nustep  L5 x 7 mins      Knee/Hip Exercises: Machines for Strengthening   Cybex Knee Extension  10# 2x10    Cybex Knee Flexion  20# 2sets 10, 25# 1x10      Knee/Hip Exercises: Standing   Forward Step Up  2 sets;15 reps alternating taps with 6in step 5x, individual taps 15x      Knee/Hip Exercises: Seated   Sit to Sand  1 set;10 reps 4 without hands               PT Short Term Goals -  09/02/17 1435      PT SHORT TERM GOAL #1   Title  independent with her home HEP    Time  2    Period  Weeks    Status  On-going        PT Long Term Goals - 11/09/17 1358      PT LONG TERM GOAL #2   Title  increase Berg balance score to 46/56 for safety    Baseline  26/5 significant set back      PT LONG TERM GOAL #3   Title  increase LE strength (particularly ABD and ext.) to 4/5 for functional gait and safety    Baseline  4-/5 grossly tested in sitting    Status  On-going      PT LONG TERM GOAL #4   Title  be able to do a sit to stand without assistance and without losing balance for functional use            Plan - 11/16/17 1407    Clinical Impression Statement  Pt negoiated obstacles well during obstacle course however she needed for foot placement on step. Verbal cuing was needed to remind her to lift knee during forward step taps. She progressed 5lb on knee flexion. Pt reported that she will be going to visit another MD at [facility] and that she did poorly during her vacation.    PT Treatment/Interventions  Stair training;Gait training;Functional mobility training;Therapeutic activities;Therapeutic exercise;Balance training;Patient/family education    PT Next Visit Plan  reassess next week for anothe rmonth while pt continues to be reassessed at [facility] ,also pt with significant decline in past 3 weeks       Patient will benefit from skilled therapeutic intervention in order to improve the following deficits and impairments:  Abnormal gait, Decreased strength, Decreased balance, Difficulty walking, Decreased activity tolerance, Decreased coordination, Decreased mobility  Visit Diagnosis: Muscle weakness (generalized)  Difficulty in walking, not elsewhere classified  Balance problem     Problem List Patient Active Problem List   Diagnosis Date Noted  . Psychogenic gait 10/21/2017  . CAD (coronary artery disease) 06/09/2017  . Abnormal nuclear cardiac imaging  test 05/25/2017  . PVC's (premature ventricular contractions) 05/17/2017  . Anxiety 12/29/2016  . Vascular parkinsonism ([place]) 06/29/2016  . Gait disorder 04/07/2016  . Intracranial carotid stenosis, bilateral 02/27/2016  . Essential hypertension   . Left-sided weakness 12/28/2015  . Headache 12/28/2015  . Adjustment reaction with anxiety 12/20/2015  . Left hemiparesis ([place])   . History of CVA with residual deficit   . Prediabetes   . SVT (supraventricular tachycardia) ([place]) 12/14/2015  .  Stroke-like symptom 12/13/2015  . History of stroke   . Heart palpitations 09/06/2012  . Hypertension   . Hyperlipidemia   . Hypothyroidism   . Vitamin D deficiency   . Palpitations   . History of dizziness   . LVH (left ventricular hypertrophy)   . Aortic stenosis   . Mitral regurgitation   . SOB (shortness of breath)   . Difficulty walking     Loyal Gambler 11/16/2017, 2:55 PM  [place], [place], [address] Phone: [phone]   Fax:  [phone]  Name: Stacey Ramirez MRN: [id number] Date of Birth: 1940/10/28

## 2017-11-18 ENCOUNTER — Ambulatory Visit: Payer: PPO | Admitting: Physical Therapy

## 2017-11-18 ENCOUNTER — Encounter: Payer: Self-pay | Admitting: Physical Therapy

## 2017-11-18 DIAGNOSIS — R262 Difficulty in walking, not elsewhere classified: Secondary | ICD-10-CM

## 2017-11-18 DIAGNOSIS — M6281 Muscle weakness (generalized): Secondary | ICD-10-CM | POA: Diagnosis not present

## 2017-11-18 NOTE — Therapy (Signed)
[facility] [place], [place], [address] Phone: [phone]   Fax:  [phone]  Physical Therapy Evaluation  Patient Details  Name: Stacey Ramirez MRN: [id number] Date of Birth: 1941-03-21 Referring Provider: Deland Pretty   Encounter Date: 11/18/2017  PT End of Session - 11/18/17 1236    Visit Number  22    Date for PT Re-Evaluation  11/23/17    PT Start Time  1149    PT Stop Time  1230    PT Time Calculation (min)  41 min    Activity Tolerance  Patient tolerated treatment well    Behavior During Therapy  [facility] for tasks assessed/performed       Past Medical History:  Diagnosis Date  . Adjustment reaction with anxiety 12/20/2015  . Aortic stenosis   . CAD (coronary artery disease) 06/09/2017   Nuc study 10/18: EF 69, inferolateral perfusion defect-possible small infarct with peri-infarct ischemia; intermediate risk // LHC 10/18: pLAD 25, pLCx 50, pRCA 25 >> med Rx  . Carotid stenosis, asymptomatic, bilateral 02/27/2016   Carotid US 5/17: Bilateral ICA 1-39  . Cerebrovascular accident (stroke) ([place])    a. 12/2015 - cryptogenic.  S/P MDT Linq.  . Difficulty walking   . Essential hypertension   . Gait disorder 04/07/2016  . Heart palpitations 09/06/2012  . History of CVA with residual deficit 12/14/2015  . History of dizziness   . History of echocardiogram    Echo 10/18: Vigorous LVF, EF 65-70, normal wall motion, grade 1 diastolic dysfunction, GLS -21.1%, mild RAE  . History of nuclear stress test    Nuclear stress test 10/18: EF 69, inf-lat defect c/w poss infarct with peri-infarct ischemia; Intermediate Risk  . Hyperlipidemia   . Hypothyroidism   . Left hemiparesis ([place])   . LVH (left ventricular hypertrophy)   . Mitral regurgitation    a. 12/2015 Echo: EF 60-65%, mild focal basal hypertrophy. No rwma, triv AI, mild MR, mildly dil LA w/o thrombus. No RA thrombus. No PFO.  Marland Kitchen Palpitations   . Prediabetes    . Stroke-like symptom 12/13/2015  . SVT (supraventricular tachycardia) ([place]) 12/14/2015  . Vascular parkinsonism ([place]) 06/29/2016  . Vitamin D deficiency     Past Surgical History:  Procedure Laterality Date  . CARDIOVASCULAR STRESS TEST  2002   NORMAL  . EP IMPLANTABLE DEVICE N/A 12/17/2015   Procedure: Loop Recorder Insertion;  Surgeon: [name], MD;  Location: [place] [facility];  Service: Cardiovascular;  Laterality: N/A;  . LEFT HEART CATH AND CORONARY ANGIOGRAPHY N/A 05/25/2017   Procedure: LEFT HEART CATH AND CORONARY ANGIOGRAPHY;  Surgeon: Sherren Mocha, MD;  Location: [place] [facility];  Service: Cardiovascular;  Laterality: N/A;  . LOOP RECORDER IMPLANT    . TEE WITHOUT CARDIOVERSION N/A 12/17/2015   Procedure: TRANSESOPHAGEAL ECHOCARDIOGRAM (TEE);  Surgeon: Lelon Perla, MD;  Location: [facility] ENDOSCOPY;  Service: Cardiovascular;  Laterality: N/A;  Pt also needs a LOOP  . TRANSTHORACIC ECHOCARDIOGRAM  2008   SHOWED LEFT VENTRICULAR HYPERTROPHY AND MILD AORTIC STENOSIS    There were no vitals filed for this visit.   Subjective Assessment - 11/18/17 1151    Subjective  "I'm alright,but i'm a little tired today from waking up early"                    Objective measurements completed on examination: See above findings.      [place] Adult PT Treatment/Exercise -  11/18/17 0001      Ambulation/Gait   Ambulation/Gait  --    Gait Comments  Gait down the the hall. Decreased step length on L lower extremity, no reciprocal arm swing with left upper extremity.pt needed vc to relax left hand and turn       Knee/Hip Exercises: Aerobic   Nustep  L5 x 7 min      Knee/Hip Exercises: Standing   Other Standing Knee Exercises  step over green noodle     Other Standing Knee Exercises  alt taps 6 in box      Knee/Hip Exercises: Seated   Ball Squeeze  10x with 5 second    Hamstring Curl  2 sets;15 reps;Limitations red theraband    Sit to Sand  2 sets;10 reps;with  UE support               PT Short Term Goals - 09/02/17 1435      PT SHORT TERM GOAL #1   Title  independent with her home HEP    Time  2    Period  Weeks    Status  On-going        PT Long Term Goals - 11/09/17 1358      PT LONG TERM GOAL #2   Title  increase Berg balance score to 46/56 for safety    Baseline  26/5 significant set back      PT LONG TERM GOAL #3   Title  increase LE strength (particularly ABD and ext.) to 4/5 for functional gait and safety    Baseline  4-/5 grossly tested in sitting    Status  On-going      PT LONG TERM GOAL #4   Title  be able to do a sit to stand without assistance and without losing balance for functional use             Plan - 11/18/17 1238    Clinical Impression Statement  Pt verbalized that today was an "unsteady day". She had increased spasticity in L LE and difficulty with pivoting. During ambulation, pt needed vc to relax left hand and to lift foot off the ground. She needed CGA for lateral step over and LOB 3x. Transitioned to seated exercises due pt verbalizing increased fatigue today.    Rehab Potential  Good    PT Frequency  2x / week    PT Duration  8 weeks    PT Treatment/Interventions  Stair training;Gait training;Functional mobility training;Therapeutic activities;Therapeutic exercise;Balance training;Patient/family education    PT Next Visit Plan  Pt to be reassessed on Monday at [facility]. Work on functional balance        Patient will benefit from skilled therapeutic intervention in order to improve the following deficits and impairments:  Abnormal gait, Decreased strength, Decreased balance, Difficulty walking, Decreased activity tolerance, Decreased coordination, Decreased mobility  Visit Diagnosis: Muscle weakness (generalized)  Difficulty in walking, not elsewhere classified     Problem List Patient Active Problem List   Diagnosis Date Noted  . Psychogenic gait 10/21/2017  . CAD (coronary  artery disease) 06/09/2017  . Abnormal nuclear cardiac imaging test 05/25/2017  . PVC's (premature ventricular contractions) 05/17/2017  . Anxiety 12/29/2016  . Vascular parkinsonism ([place]) 06/29/2016  . Gait disorder 04/07/2016  . Intracranial carotid stenosis, bilateral 02/27/2016  . Essential hypertension   . Left-sided weakness 12/28/2015  . Headache 12/28/2015  . Adjustment reaction with anxiety 12/20/2015  . Left hemiparesis ([place])   .  History of CVA with residual deficit   . Prediabetes   . SVT (supraventricular tachycardia) ([place]) 12/14/2015  . Stroke-like symptom 12/13/2015  . History of stroke   . Heart palpitations 09/06/2012  . Hypertension   . Hyperlipidemia   . Hypothyroidism   . Vitamin D deficiency   . Palpitations   . History of dizziness   . LVH (left ventricular hypertrophy)   . Aortic stenosis   . Mitral regurgitation   . SOB (shortness of breath)   . Difficulty walking     Loyal Gambler 11/18/2017, 12:50 PM  [place], [place], [address] Phone: [phone]   Fax:  [phone]  Name: Stacey Ramirez MRN: [id number] Date of Birth: 06/14/1941

## 2017-11-22 DIAGNOSIS — R29898 Other symptoms and signs involving the musculoskeletal system: Secondary | ICD-10-CM | POA: Diagnosis not present

## 2017-11-22 DIAGNOSIS — I69354 Hemiplegia and hemiparesis following cerebral infarction affecting left non-dominant side: Secondary | ICD-10-CM | POA: Diagnosis not present

## 2017-11-22 LAB — CUP PACEART REMOTE DEVICE CHECK
Date Time Interrogation Session: 20190311114019
Implantable Pulse Generator Implant Date: 20170516

## 2017-11-24 ENCOUNTER — Ambulatory Visit: Payer: PPO | Admitting: Physical Therapy

## 2017-11-24 ENCOUNTER — Encounter: Payer: Self-pay | Admitting: Physical Therapy

## 2017-11-24 DIAGNOSIS — R2689 Other abnormalities of gait and mobility: Secondary | ICD-10-CM

## 2017-11-24 DIAGNOSIS — M6281 Muscle weakness (generalized): Secondary | ICD-10-CM | POA: Diagnosis not present

## 2017-11-24 DIAGNOSIS — R262 Difficulty in walking, not elsewhere classified: Secondary | ICD-10-CM

## 2017-11-24 NOTE — Therapy (Signed)
[facility], [place], [address] Phone: [phone]   Fax:  [phone]  Physical Therapy Treatment  Patient Details  Name: Stacey Ramirez MRN: [id number] Date of Birth: 1941-03-17 Referring Provider: Deland Pretty   Encounter Date: 11/24/2017  PT End of Session - 11/24/17 1431    Visit Number  23    Date for PT Re-Evaluation  11/23/17    PT Start Time  1345    PT Stop Time  1430    PT Time Calculation (min)  45 min    Activity Tolerance  Patient tolerated treatment well    Behavior During Therapy  [facility] for tasks assessed/performed       Past Medical History:  Diagnosis Date  . Adjustment reaction with anxiety 12/20/2015  . Aortic stenosis   . CAD (coronary artery disease) 06/09/2017   Nuc study 10/18: EF 69, inferolateral perfusion defect-possible small infarct with peri-infarct ischemia; intermediate risk // LHC 10/18: pLAD 25, pLCx 50, pRCA 25 >> med Rx  . Carotid stenosis, asymptomatic, bilateral 02/27/2016   Carotid US 5/17: Bilateral ICA 1-39  . Cerebrovascular accident (stroke) ([place])    a. 12/2015 - cryptogenic.  S/P MDT Linq.  . Difficulty walking   . Essential hypertension   . Gait disorder 04/07/2016  . Heart palpitations 09/06/2012  . History of CVA with residual deficit 12/14/2015  . History of dizziness   . History of echocardiogram    Echo 10/18: Vigorous LVF, EF 65-70, normal wall motion, grade 1 diastolic dysfunction, GLS -21.1%, mild RAE  . History of nuclear stress test    Nuclear stress test 10/18: EF 69, inf-lat defect c/w poss infarct with peri-infarct ischemia; Intermediate Risk  . Hyperlipidemia   . Hypothyroidism   . Left hemiparesis ([place])   . LVH (left ventricular hypertrophy)   . Mitral regurgitation    a. 12/2015 Echo: EF 60-65%, mild focal basal hypertrophy. No rwma, triv AI, mild MR, mildly dil LA w/o thrombus. No RA thrombus. No PFO.  Marland Kitchen Palpitations   . Prediabetes    . Stroke-like symptom 12/13/2015  . SVT (supraventricular tachycardia) ([place]) 12/14/2015  . Vascular parkinsonism ([place]) 06/29/2016  . Vitamin D deficiency     Past Surgical History:  Procedure Laterality Date  . CARDIOVASCULAR STRESS TEST  2002   NORMAL  . EP IMPLANTABLE DEVICE N/A 12/17/2015   Procedure: Loop Recorder Insertion;  Surgeon: [name], MD;  Location: [place] [facility];  Service: Cardiovascular;  Laterality: N/A;  . LEFT HEART CATH AND CORONARY ANGIOGRAPHY N/A 05/25/2017   Procedure: LEFT HEART CATH AND CORONARY ANGIOGRAPHY;  Surgeon: Sherren Mocha, MD;  Location: [place] [facility];  Service: Cardiovascular;  Laterality: N/A;  . LOOP RECORDER IMPLANT    . TEE WITHOUT CARDIOVERSION N/A 12/17/2015   Procedure: TRANSESOPHAGEAL ECHOCARDIOGRAM (TEE);  Surgeon: Lelon Perla, MD;  Location: [facility] ENDOSCOPY;  Service: Cardiovascular;  Laterality: N/A;  Pt also needs a LOOP  . TRANSTHORACIC ECHOCARDIOGRAM  2008   SHOWED LEFT VENTRICULAR HYPERTROPHY AND MILD AORTIC STENOSIS    There were no vitals filed for this visit.  Subjective Assessment - 11/24/17 1351    Subjective  "I'm not walking too good today."    Currently in Pain?  No/denies                       [facility] Adult PT Treatment/Exercise - 11/24/17 0001  Ambulation/Gait   Gait Comments  Gait down the hall vc to relax left hand and lift left knee higher      Knee/Hip Exercises: Aerobic   Nustep  L4 x 7 min      Knee/Hip Exercises: Standing   Other Standing Knee Exercises  step over foam roller with 3lb cuff weights 10x    Other Standing Knee Exercises  3 taps alt right and left 5x       Knee/Hip Exercises: Seated   Hamstring Curl  AROM;Both;3 sets;10 reps    Hamstring Limitations  Green Theraband eccentric control     Sit to Sand  1 set;10 reps;with UE support               PT Short Term Goals - 09/02/17 1435      PT SHORT TERM GOAL #1   Title  independent with her home HEP     Time  2    Period  Weeks    Status  On-going        PT Long Term Goals - 11/09/17 1358      PT LONG TERM GOAL #2   Title  increase Berg balance score to 46/56 for safety    Baseline  26/5 significant set back      PT LONG TERM GOAL #3   Title  increase LE strength (particularly ABD and ext.) to 4/5 for functional gait and safety    Baseline  4-/5 grossly tested in sitting    Status  On-going      PT LONG TERM GOAL #4   Title  be able to do a sit to stand without assistance and without losing balance for functional use            Plan - 11/24/17 1432    Clinical Impression Statement  Pt verbalized that she felt "unsteady" today and her leg was "wobbly". She had difficulty with alt 6in taps and had to decrease to 4in step with 3 sucessive taps.She required vc while walking down hallway to lift left foot and relax left hand. she reported that she visited a doctor a [facility] this week and that they would do further testing on her to determine the source of her problem.    PT Frequency  2x / week    PT Duration  8 weeks    PT Treatment/Interventions  Stair training;Gait training;Functional mobility training;Therapeutic activities;Therapeutic exercise;Balance training;Patient/family education    PT Next Visit Plan  Continue LE strengthening and functional balance exercises       Patient will benefit from skilled therapeutic intervention in order to improve the following deficits and impairments:  Abnormal gait, Decreased strength, Decreased balance, Difficulty walking, Decreased activity tolerance, Decreased coordination, Decreased mobility  Visit Diagnosis: Muscle weakness (generalized)  Difficulty in walking, not elsewhere classified  Balance problem     Problem List Patient Active Problem List   Diagnosis Date Noted  . Psychogenic gait 10/21/2017  . CAD (coronary artery disease) 06/09/2017  . Abnormal nuclear cardiac imaging test 05/25/2017  . PVC's  (premature ventricular contractions) 05/17/2017  . Anxiety 12/29/2016  . Vascular parkinsonism ([place]) 06/29/2016  . Gait disorder 04/07/2016  . Intracranial carotid stenosis, bilateral 02/27/2016  . Essential hypertension   . Left-sided weakness 12/28/2015  . Headache 12/28/2015  . Adjustment reaction with anxiety 12/20/2015  . Left hemiparesis ([place])   . History of CVA with residual deficit   . Prediabetes   . SVT (supraventricular tachycardia) ([place])  12/14/2015  . Stroke-like symptom 12/13/2015  . History of stroke   . Heart palpitations 09/06/2012  . Hypertension   . Hyperlipidemia   . Hypothyroidism   . Vitamin D deficiency   . Palpitations   . History of dizziness   . LVH (left ventricular hypertrophy)   . Aortic stenosis   . Mitral regurgitation   . SOB (shortness of breath)   . Difficulty walking     Loyal Gambler 11/24/2017, 2:40 PM  [place], [place], [address] Phone: [phone]   Fax:  [phone]  Name: Stacey Ramirez MRN: [id number] Date of Birth: 1940/09/05

## 2017-11-24 NOTE — Addendum Note (Signed)
Addended by: Sumner Boast on: 11/24/2017 06:01 PM   Modules accepted: Orders

## 2017-11-25 ENCOUNTER — Encounter: Payer: Self-pay | Admitting: Physical Therapy

## 2017-11-25 ENCOUNTER — Ambulatory Visit: Payer: PPO | Admitting: Physical Therapy

## 2017-11-25 DIAGNOSIS — M6281 Muscle weakness (generalized): Secondary | ICD-10-CM | POA: Diagnosis not present

## 2017-11-25 DIAGNOSIS — R262 Difficulty in walking, not elsewhere classified: Secondary | ICD-10-CM

## 2017-11-25 DIAGNOSIS — R2689 Other abnormalities of gait and mobility: Secondary | ICD-10-CM

## 2017-11-25 NOTE — Therapy (Signed)
[facility], [place], [address] Phone: [phone]   Fax:  [phone]  Physical Therapy Treatment  Patient Details  Name: Stacey Ramirez MRN: [id number] Date of Birth: 08/04/1940 Referring Provider: Deland Pretty   Encounter Date: 11/25/2017  PT End of Session - 11/25/17 1235    Visit Number  24    Date for PT Re-Evaluation  12/23/17    PT Start Time  1145    PT Stop Time  1230    PT Time Calculation (min)  45 min    Activity Tolerance  Patient tolerated treatment well    Behavior During Therapy  [facility] for tasks assessed/performed       Past Medical History:  Diagnosis Date  . Adjustment reaction with anxiety 12/20/2015  . Aortic stenosis   . CAD (coronary artery disease) 06/09/2017   Nuc study 10/18: EF 69, inferolateral perfusion defect-possible small infarct with peri-infarct ischemia; intermediate risk // LHC 10/18: pLAD 25, pLCx 50, pRCA 25 >> med Rx  . Carotid stenosis, asymptomatic, bilateral 02/27/2016   Carotid US 5/17: Bilateral ICA 1-39  . Cerebrovascular accident (stroke) ([place])    a. 12/2015 - cryptogenic.  S/P MDT Linq.  . Difficulty walking   . Essential hypertension   . Gait disorder 04/07/2016  . Heart palpitations 09/06/2012  . History of CVA with residual deficit 12/14/2015  . History of dizziness   . History of echocardiogram    Echo 10/18: Vigorous LVF, EF 65-70, normal wall motion, grade 1 diastolic dysfunction, GLS -21.1%, mild RAE  . History of nuclear stress test    Nuclear stress test 10/18: EF 69, inf-lat defect c/w poss infarct with peri-infarct ischemia; Intermediate Risk  . Hyperlipidemia   . Hypothyroidism   . Left hemiparesis ([place])   . LVH (left ventricular hypertrophy)   . Mitral regurgitation    a. 12/2015 Echo: EF 60-65%, mild focal basal hypertrophy. No rwma, triv AI, mild MR, mildly dil LA w/o thrombus. No RA thrombus. No PFO.  Marland Kitchen Palpitations   . Prediabetes    . Stroke-like symptom 12/13/2015  . SVT (supraventricular tachycardia) ([place]) 12/14/2015  . Vascular parkinsonism ([place]) 06/29/2016  . Vitamin D deficiency     Past Surgical History:  Procedure Laterality Date  . CARDIOVASCULAR STRESS TEST  2002   NORMAL  . EP IMPLANTABLE DEVICE N/A 12/17/2015   Procedure: Loop Recorder Insertion;  Surgeon: [name], MD;  Location: [facility] [place] [facility];  Service: Cardiovascular;  Laterality: N/A;  . LEFT HEART CATH AND CORONARY ANGIOGRAPHY N/A 05/25/2017   Procedure: LEFT HEART CATH AND CORONARY ANGIOGRAPHY;  Surgeon: Sherren Mocha, MD;  Location: [facility];  Service: Cardiovascular;  Laterality: N/A;  . LOOP RECORDER IMPLANT    . TEE WITHOUT CARDIOVERSION N/A 12/17/2015   Procedure: TRANSESOPHAGEAL ECHOCARDIOGRAM (TEE);  Surgeon: Lelon Perla, MD;  Location: [facility] ENDOSCOPY;  Service: Cardiovascular;  Laterality: N/A;  Pt also needs a LOOP  . TRANSTHORACIC ECHOCARDIOGRAM  2008   SHOWED LEFT VENTRICULAR HYPERTROPHY AND MILD AORTIC STENOSIS    There were no vitals filed for this visit.  Subjective Assessment - 11/25/17 1150    Subjective  "I woke up and my hip hurt today". [facility] Doctor has ordered a MRI on her lumbar spine per husband report.     Currently in Pain?  No/denies  [place] Adult PT Treatment/Exercise - 11/25/17 0001      High Level Balance   High Level Balance Comments  step over black noodle and then green noodle  (forward and lateral steps) Lateral steps needed HHA       Knee/Hip Exercises: Machines for Strengthening   Cybex Knee Extension  10# 2x10    Cybex Knee Flexion  20lb 3x10      Knee/Hip Exercises: Standing   Other Standing Knee Exercises  3 taps alt right and left 5x, alt taps 10x      Knee/Hip Exercises: Seated   Sit to Sand  1 set;10 reps;without UE support               PT Short Term Goals - 09/02/17 1435      PT SHORT TERM GOAL #1   Title  independent  with her home HEP    Time  2    Period  Weeks    Status  On-going        PT Long Term Goals - 11/09/17 1358      PT LONG TERM GOAL #2   Title  increase Berg balance score to 46/56 for safety    Baseline  26/5 significant set back      PT LONG TERM GOAL #3   Title  increase LE strength (particularly ABD and ext.) to 4/5 for functional gait and safety    Baseline  4-/5 grossly tested in sitting    Status  On-going      PT LONG TERM GOAL #4   Title  be able to do a sit to stand without assistance and without losing balance for functional use            Plan - 11/25/17 1236    Clinical Impression Statement  Pt was able to do 3 sucessive taps and alternating taps on a 6in step today. She had difficulty with lateral step over with two sucessive foam rollers of differing heights and needed at times HHA and CGA. She was able to do 10 sit to stand without upper extremity support at the end of session. She needed vc to take bigger steps while walking around the treatment room.    PT Frequency  2x / week    PT Next Visit Plan  LE strengthening, endurance and functional balance exercises       Patient will benefit from skilled therapeutic intervention in order to improve the following deficits and impairments:     Visit Diagnosis: Muscle weakness (generalized)  Difficulty in walking, not elsewhere classified  Balance problem     Problem List Patient Active Problem List   Diagnosis Date Noted  . Psychogenic gait 10/21/2017  . CAD (coronary artery disease) 06/09/2017  . Abnormal nuclear cardiac imaging test 05/25/2017  . PVC's (premature ventricular contractions) 05/17/2017  . Anxiety 12/29/2016  . Vascular parkinsonism ([place]) 06/29/2016  . Gait disorder 04/07/2016  . Intracranial carotid stenosis, bilateral 02/27/2016  . Essential hypertension   . Left-sided weakness 12/28/2015  . Headache 12/28/2015  . Adjustment reaction with anxiety 12/20/2015  . Left hemiparesis  ([place])   . History of CVA with residual deficit   . Prediabetes   . SVT (supraventricular tachycardia) ([place]) 12/14/2015  . Stroke-like symptom 12/13/2015  . History of stroke   . Heart palpitations 09/06/2012  . Hypertension   . Hyperlipidemia   . Hypothyroidism   . Vitamin D deficiency   . Palpitations   . History of  dizziness   . LVH (left ventricular hypertrophy)   . Aortic stenosis   . Mitral regurgitation   . SOB (shortness of breath)   . Difficulty walking     Loyal Gambler 11/25/2017, 12:47 PM  [place], [place], [address] Phone: [phone]   Fax:  [phone]  Name: ZAKARIA FROMER MRN: [id number] Date of Birth: 1941/04/07

## 2017-11-29 ENCOUNTER — Ambulatory Visit: Payer: PPO | Admitting: Internal Medicine

## 2017-11-29 ENCOUNTER — Encounter: Payer: Self-pay | Admitting: Internal Medicine

## 2017-11-29 VITALS — BP 134/82 | HR 97 | Ht 62.0 in | Wt 119.0 lb

## 2017-11-29 DIAGNOSIS — Z8673 Personal history of transient ischemic attack (TIA), and cerebral infarction without residual deficits: Secondary | ICD-10-CM | POA: Diagnosis not present

## 2017-11-29 DIAGNOSIS — I2510 Atherosclerotic heart disease of native coronary artery without angina pectoris: Secondary | ICD-10-CM | POA: Diagnosis not present

## 2017-11-29 DIAGNOSIS — E782 Mixed hyperlipidemia: Secondary | ICD-10-CM | POA: Diagnosis not present

## 2017-11-29 DIAGNOSIS — E7849 Other hyperlipidemia: Secondary | ICD-10-CM

## 2017-11-29 MED ORDER — EZETIMIBE 10 MG PO TABS: 10.0000 mg | ORAL_TABLET | Freq: Every day | ORAL | 5 refills | Status: DC

## 2017-11-29 NOTE — Progress Notes (Signed)
OFFICE NOTE  Chief Complaint:  Evaluate dyslipidemia  Primary Care Physician: Deland Pretty, MD  HPI:  Stacey Ramirez is a 77 y.o. female with a past medial history significant for coronary artery disease with mild to moderate multivessel disease by cardiac catheterization October 2018, bilateral carotid artery stenosis, history of stroke and severe dyslipidemia.  Most recently in February 2019, total cholesterol was 385, triglycerides 183, HDL 47 LDL 301, and non-HDL cholesterol of 338.  Stacey Ramirez is had long-standing dyslipidemia, but has been intolerant to medications, particularly the statins.  She is tried simvastatin, rosuvastatin, atorvastatin and pravastatin, all of which cause significant lower extremity weakness to the point for which she could barely stand up.  Based on this she was seen in our lipid clinic in started initially on Praluent.  She seemed to have some improvement in her lipid profile on Praluent, then her cholesterol rebounded.  She was switched to Repatha, but there was no significant reduction in cholesterol.  This is concerning for possible genetic mutation that may render the medication ineffective.  She was then considered for the CLEAR trial of bempedoic acid.  Notes indicated that she was not interested in the trial, primarily that they were concerned that they would be randomized either to receive the drug or placebo.  These findings are highly concerning for familial hyperlipidemia.  She does have a history of heart disease in her family, notably on her mother side of the family with her mother having had a heart attack.  Her [place] lipid score is 9, consistent with definite FH.  PMHx:  Past Medical History:  Diagnosis Date  . Adjustment reaction with anxiety 12/20/2015  . Aortic stenosis   . CAD (coronary artery disease) 06/09/2017   Nuc study 10/18: EF 69, inferolateral perfusion defect-possible small infarct with peri-infarct ischemia; intermediate risk // LHC  10/18: pLAD 25, pLCx 50, pRCA 25 >> med Rx  . Carotid stenosis, asymptomatic, bilateral 02/27/2016   Carotid US 5/17: Bilateral ICA 1-39  . Cerebrovascular accident (stroke) ([place])    a. 12/2015 - cryptogenic.  S/P MDT Linq.  . Difficulty walking   . Essential hypertension   . Gait disorder 04/07/2016  . Heart palpitations 09/06/2012  . History of CVA with residual deficit 12/14/2015  . History of dizziness   . History of echocardiogram    Echo 10/18: Vigorous LVF, EF 65-70, normal wall motion, grade 1 diastolic dysfunction, GLS -21.1%, mild RAE  . History of nuclear stress test    Nuclear stress test 10/18: EF 69, inf-lat defect c/w poss infarct with peri-infarct ischemia; Intermediate Risk  . Hyperlipidemia   . Hypothyroidism   . Left hemiparesis ([place])   . LVH (left ventricular hypertrophy)   . Mitral regurgitation    a. 12/2015 Echo: EF 60-65%, mild focal basal hypertrophy. No rwma, triv AI, mild MR, mildly dil LA w/o thrombus. No RA thrombus. No PFO.  Marland Kitchen Palpitations   . Prediabetes   . Stroke-like symptom 12/13/2015  . SVT (supraventricular tachycardia) ([place]) 12/14/2015  . Vascular parkinsonism ([place]) 06/29/2016  . Vitamin D deficiency     Past Surgical History:  Procedure Laterality Date  . CARDIOVASCULAR STRESS TEST  2002   NORMAL  . EP IMPLANTABLE DEVICE N/A 12/17/2015   Procedure: Loop Recorder Insertion;  Surgeon: [name], MD;  Location: [facility];  Service: Cardiovascular;  Laterality: N/A;  . LEFT HEART CATH AND CORONARY ANGIOGRAPHY N/A 05/25/2017   Procedure: LEFT HEART CATH AND CORONARY  ANGIOGRAPHY;  Surgeon: Sherren Mocha, MD;  Location: [facility];  Service: Cardiovascular;  Laterality: N/A;  . LOOP RECORDER IMPLANT    . TEE WITHOUT CARDIOVERSION N/A 12/17/2015   Procedure: TRANSESOPHAGEAL ECHOCARDIOGRAM (TEE);  Surgeon: Lelon Perla, MD;  Location: [facility] ENDOSCOPY;  Service: Cardiovascular;  Laterality: N/A;  Pt also needs a LOOP  . TRANSTHORACIC  ECHOCARDIOGRAM  2008   SHOWED LEFT VENTRICULAR HYPERTROPHY AND MILD AORTIC STENOSIS    FAMHx:  Family History  Problem Relation Age of Onset  . Heart attack Mother   . Alzheimer's disease Mother   . Stroke Paternal Uncle     SOCHx:   reports that she has never smoked. She has never used smokeless tobacco. She reports that she drinks about 0.6 oz of alcohol per week. She reports that she does not use drugs.  ALLERGIES:  Allergies  Allergen Reactions  . Sulfa Drugs Cross Reactors Itching  . Zocor [Simvastatin] Other (See Comments)    Feels like she is going to pass out, weakness  . Crestor [Rosuvastatin Calcium]     Muscle weakness  . Statins Other (See Comments)    Unable to walk or stand. Unable to walk or stand. Unable to walk or stand.  . Sulfa Antibiotics     welts  . Micardis [Telmisartan] Other (See Comments)    Feels like she is going to pass out    ROS: Pertinent items noted in HPI and remainder of comprehensive ROS otherwise negative.  HOME MEDS: Current Outpatient Medications on File Prior to Visit  Medication Sig Dispense Refill  . ALPRAZolam (XANAX) 0.25 MG tablet Take 0.25 mg by mouth at bedtime as needed for anxiety or sleep.    Marland Kitchen amLODipine (NORVASC) 5 MG tablet Take 1 tablet by mouth daily.  3  . carboxymethylcellulose (REFRESH PLUS) 0.5 % SOLN Place 1 drop into both eyes 3 (three) times daily as needed (DRY EYE).     . Cholecalciferol (VITAMIN D) 2000 units tablet Take 2,000 Units by mouth daily.    . clopidogrel (PLAVIX) 75 MG tablet Take 1 tablet (75 mg total) by mouth daily. 30 tablet 0  . irbesartan (AVAPRO) 300 MG tablet Take 1 tablet by mouth daily.  0  . levothyroxine (SYNTHROID, LEVOTHROID) 25 MCG tablet Take 25 mcg by mouth daily before breakfast.   6  . loratadine (CLARITIN) 10 MG tablet Take 10 mg by mouth daily as needed for allergies.     Marland Kitchen LOTEMAX 0.5 % ophthalmic suspension Place 1 drop into both eyes 4 (four) times daily as needed. For  dry itchy eyes    . Multiple Vitamin (MULTIVITAMIN) tablet Take 1 tablet by mouth daily.    . sertraline (ZOLOFT) 50 MG tablet Take 50 mg by mouth daily.      No current facility-administered medications on file prior to visit.     LABS/IMAGING: No results found for this or any previous visit (from the past 48 hour(s)). No results found.  LIPID PANEL:    Component Value Date/Time   CHOL 385 (H) 09/13/2017 1453   TRIG 183 (H) 09/13/2017 1453   HDL 47 09/13/2017 1453   CHOLHDL 8.2 (H) 09/13/2017 1453   CHOLHDL 4.8 12/13/2015 1558   VLDL 12 12/13/2015 1558   LDLCALC 301 (H) 09/13/2017 1453   LDLDIRECT 224.6 12/14/2011 0919     WEIGHTS: Wt Readings from Last 3 Encounters:  11/29/17 119 lb (54 kg)  10/20/17 120 lb 9.6 oz (54.7 kg)  09/13/17 120 lb 6.4 oz (54.6 kg)    VITALS: BP 134/82   Pulse 97   Ht 5\' 2"  (1.575 m)   Wt 119 lb (54 kg)   BMI 21.77 kg/m   EXAM: General appearance: alert, no distress and No corneal arcus Neck: no carotid bruit, no JVD and thyroid not enlarged, symmetric, no tenderness/mass/nodules Lungs: clear to auscultation bilaterally Heart: regular rate and rhythm Abdomen: soft, non-tender; bowel sounds normal; no masses,  no organomegaly Extremities: extremities normal, atraumatic, no cyanosis or edema and No tendinous xanthomas Pulses: 2+ and symmetric Skin: Pale, warm, dry Neurologic: Grossly normal Psych: Pleasant  EKG: Deferred  ASSESSMENT: 1. Definite FH-Dutch score of 9, probable HeFH 2. Mild to moderate nonobstructive coronary disease 3. Cerebrovascular disease with prior stroke 4. Statin intolerance 5. Non-responder to PCSK9  PLAN: 1.   Stacey Ramirez unfortunately has been intolerant to statins and had no significant improvement in LDL cholesterol with both commercially available PCSK9 inhibitors.  Possibilities include either LDL receptor or PCSK9 mutations.  We discussed genetic testing today.  This should give [place] the ability to  screen for these genetic mutations, the knowledge of which would help [place] further select therapies that could be used in the future for her to lower cholesterol.  Currently, we will have to investigate other options.  She is not previously been on ezetimibe and I feel probably could tolerate this without any significant myalgias.  This should give [place] at least 20 to 25% reduction cholesterol.  I offered to consider low-dose rosuvastatin however she and her husband are both opposed to any statin therapy.  Finally, she may be a candidate for lomitapide.  Will consider this option based on genetic testing.  We also discussed another alternative, which is LDL a pheresis.  Although this is not provided at this institution, it may be a last ditch alternative for her.  She also did not seem interested in this therapy.  For now we will plan to start ezetimibe and refer her to Dr. Broadus John for genetic counseling and further testing.  I will plan to see her back with follow-up lipid profile and we can discuss other modalities to further lower her cholesterol and consider starting lomitapide.  Thanks for the kind referral this complex lipid patient.  Pixie Casino, MD, [facility], [place] Director of the Advanced Lipid Disorders &  Cardiovascular Risk Reduction Clinic Diplomate of the American Board of Clinical Lipidology Attending Cardiologist  Direct Dial: [phone]  Fax: [phone]  Website:  [email or web address] 11/29/2017, 12:30 PM

## 2017-11-29 NOTE — Patient Instructions (Signed)
Medication Instructions:   START zetia 10mg  once daily  Labwork:  FASTING lab work in 3 months to reassess cholesterol  Testing/Procedures:  NONE  Follow-Up:  Dr. Debara Pickett has referred you to Dr. Broadus John (geneticist) who sees patient at [address] (same off as Dr. Acie Fredrickson)  Your physician recommends that you schedule a follow-up appointment with Dr. Debara Pickett for a 3 month lipid clinic visit.  If you need a refill on your cardiac medications before your next appointment, please call your pharmacy.  Any Other Special Instructions Will Be Listed Below (If Applicable).

## 2017-11-30 ENCOUNTER — Encounter: Payer: Self-pay | Admitting: Physical Therapy

## 2017-11-30 ENCOUNTER — Ambulatory Visit: Payer: PPO | Admitting: Physical Therapy

## 2017-11-30 DIAGNOSIS — M6281 Muscle weakness (generalized): Secondary | ICD-10-CM | POA: Diagnosis not present

## 2017-11-30 DIAGNOSIS — R2689 Other abnormalities of gait and mobility: Secondary | ICD-10-CM

## 2017-11-30 DIAGNOSIS — R262 Difficulty in walking, not elsewhere classified: Secondary | ICD-10-CM

## 2017-11-30 NOTE — Therapy (Signed)
[facility] [place], [place], [address] Phone: [phone]   Fax:  [phone]  Physical Therapy Evaluation  Patient Details  Name: Stacey Ramirez MRN: [id number] Date of Birth: 14-Oct-1940 Referring Provider: Deland Pretty   Encounter Date: 11/30/2017  PT End of Session - 11/30/17 1359    Visit Number  25    Date for PT Re-Evaluation  12/23/17    PT Start Time  1311    PT Stop Time  1400    PT Time Calculation (min)  49 min    Activity Tolerance  Patient tolerated treatment well    Behavior During Therapy  [facility] for tasks assessed/performed       Past Medical History:  Diagnosis Date  . Adjustment reaction with anxiety 12/20/2015  . Aortic stenosis   . CAD (coronary artery disease) 06/09/2017   Nuc study 10/18: EF 69, inferolateral perfusion defect-possible small infarct with peri-infarct ischemia; intermediate risk // LHC 10/18: pLAD 25, pLCx 50, pRCA 25 >> med Rx  . Carotid stenosis, asymptomatic, bilateral 02/27/2016   Carotid US 5/17: Bilateral ICA 1-39  . Cerebrovascular accident (stroke) ([place])    a. 12/2015 - cryptogenic.  S/P MDT Linq.  . Difficulty walking   . Essential hypertension   . Gait disorder 04/07/2016  . Heart palpitations 09/06/2012  . History of CVA with residual deficit 12/14/2015  . History of dizziness   . History of echocardiogram    Echo 10/18: Vigorous LVF, EF 65-70, normal wall motion, grade 1 diastolic dysfunction, GLS -21.1%, mild RAE  . History of nuclear stress test    Nuclear stress test 10/18: EF 69, inf-lat defect c/w poss infarct with peri-infarct ischemia; Intermediate Risk  . Hyperlipidemia   . Hypothyroidism   . Left hemiparesis ([place])   . LVH (left ventricular hypertrophy)   . Mitral regurgitation    a. 12/2015 Echo: EF 60-65%, mild focal basal hypertrophy. No rwma, triv AI, mild MR, mildly dil LA w/o thrombus. No RA thrombus. No PFO.  Marland Kitchen Palpitations   . Prediabetes    . Stroke-like symptom 12/13/2015  . SVT (supraventricular tachycardia) ([place]) 12/14/2015  . Vascular parkinsonism ([place]) 06/29/2016  . Vitamin D deficiency     Past Surgical History:  Procedure Laterality Date  . CARDIOVASCULAR STRESS TEST  2002   NORMAL  . EP IMPLANTABLE DEVICE N/A 12/17/2015   Procedure: Loop Recorder Insertion;  Surgeon: [name], MD;  Location: [facility];  Service: Cardiovascular;  Laterality: N/A;  . LEFT HEART CATH AND CORONARY ANGIOGRAPHY N/A 05/25/2017   Procedure: LEFT HEART CATH AND CORONARY ANGIOGRAPHY;  Surgeon: Sherren Mocha, MD;  Location: [facility];  Service: Cardiovascular;  Laterality: N/A;  . LOOP RECORDER IMPLANT    . TEE WITHOUT CARDIOVERSION N/A 12/17/2015   Procedure: TRANSESOPHAGEAL ECHOCARDIOGRAM (TEE);  Surgeon: Lelon Perla, MD;  Location: [facility] ENDOSCOPY;  Service: Cardiovascular;  Laterality: N/A;  Pt also needs a LOOP  . TRANSTHORACIC ECHOCARDIOGRAM  2008   SHOWED LEFT VENTRICULAR HYPERTROPHY AND MILD AORTIC STENOSIS    There were no vitals filed for this visit.   Subjective Assessment - 11/30/17 1312    Subjective  "I'm hanging in there"    Currently in Pain?  No/denies    Pain Score  0-No pain                    Objective measurements completed on examination: See above findings.  [place] Adult PT Treatment/Exercise - 11/30/17 0001      High Level Balance   High Level Balance Comments  ladder  2x forward, 2x lateral,diagnoal      Knee/Hip Exercises: Aerobic   Nustep  L4 x 7 min       Knee/Hip Exercises: Machines for Strengthening   Cybex Knee Extension  10lbs 2x10    Cybex Knee Flexion  2x15 20lbs, 2x15 15 lbs      Knee/Hip Exercises: Standing   Gait Training  3x down hall. vc needed to take bigger steps    Other Standing Knee Exercises  3lb forward and backwards marching with HHA 2x across treatment room      Knee/Hip Exercises: Seated   Sit to Sand  1 set;10 reps;without UE  support               PT Short Term Goals - 09/02/17 1435      PT SHORT TERM GOAL #1   Title  independent with her home HEP    Time  2    Period  Weeks    Status  On-going        PT Long Term Goals - 11/09/17 1358      PT LONG TERM GOAL #2   Title  increase Berg balance score to 46/56 for safety    Baseline  26/5 significant set back      PT LONG TERM GOAL #3   Title  increase LE strength (particularly ABD and ext.) to 4/5 for functional gait and safety    Baseline  4-/5 grossly tested in sitting    Status  On-going      PT LONG TERM GOAL #4   Title  be able to do a sit to stand without assistance and without losing balance for functional use             Plan - 11/30/17 1402    Clinical Impression Statement  Pt struggled today with lifting her knee during the ladder exercise, and during walking down the hall. She needed cuing and HHA to do forward marching and backwards marching with 3lb cuff weight.Pt reports that she will going to a doctors appointment on Thursday.   PT Frequency  2x / week    PT Duration  8 weeks    PT Treatment/Interventions  Stair training;Gait training;Functional mobility training;Therapeutic activities;Therapeutic exercise;Balance training;Patient/family education    PT Next Visit Plan  LE strengthening, endurance and functional balance exercises       Patient will benefit from skilled therapeutic intervention in order to improve the following deficits and impairments:  Abnormal gait, Decreased strength, Decreased balance, Difficulty walking, Decreased activity tolerance, Decreased coordination, Decreased mobility  Visit Diagnosis: Muscle weakness (generalized)  Difficulty in walking, not elsewhere classified  Balance problem     Problem List Patient Active Problem List   Diagnosis Date Noted  . Psychogenic gait 10/21/2017  . Coronary artery disease involving native coronary artery of native heart without angina pectoris  06/09/2017  . Abnormal nuclear cardiac imaging test 05/25/2017  . PVC's (premature ventricular contractions) 05/17/2017  . Anxiety 12/29/2016  . Vascular parkinsonism ([place]) 06/29/2016  . Gait disorder 04/07/2016  . Intracranial carotid stenosis, bilateral 02/27/2016  . Essential hypertension   . Left-sided weakness 12/28/2015  . Headache 12/28/2015  . Adjustment reaction with anxiety 12/20/2015  . Left hemiparesis ([place])   . History of CVA with residual deficit   . Prediabetes   . SVT (supraventricular tachycardia) (  [place]) 12/14/2015  . Stroke-like symptom 12/13/2015  . History of stroke   . Heart palpitations 09/06/2012  . Hypertension   . Familial hyperlipidemia   . Hypothyroidism   . Vitamin D deficiency   . Palpitations   . History of dizziness   . LVH (left ventricular hypertrophy)   . Aortic stenosis   . Mitral regurgitation   . SOB (shortness of breath)   . Difficulty walking     Loyal Gambler 11/30/2017, 2:08 PM  [place], [place], [address] Phone: [phone]   Fax:  [phone]  Name: VELENA KEEGAN MRN: [id number] Date of Birth: 15-Nov-1940

## 2017-12-02 ENCOUNTER — Encounter: Payer: Self-pay | Admitting: Physical Therapy

## 2017-12-02 ENCOUNTER — Ambulatory Visit: Payer: PPO | Attending: Internal Medicine | Admitting: Physical Therapy

## 2017-12-02 DIAGNOSIS — F419 Anxiety disorder, unspecified: Secondary | ICD-10-CM | POA: Diagnosis not present

## 2017-12-02 DIAGNOSIS — M40294 Other kyphosis, thoracic region: Secondary | ICD-10-CM | POA: Diagnosis not present

## 2017-12-02 DIAGNOSIS — R29898 Other symptoms and signs involving the musculoskeletal system: Secondary | ICD-10-CM | POA: Diagnosis not present

## 2017-12-02 DIAGNOSIS — G8194 Hemiplegia, unspecified affecting left nondominant side: Secondary | ICD-10-CM | POA: Insufficient documentation

## 2017-12-02 DIAGNOSIS — M542 Cervicalgia: Secondary | ICD-10-CM | POA: Diagnosis not present

## 2017-12-02 DIAGNOSIS — R262 Difficulty in walking, not elsewhere classified: Secondary | ICD-10-CM

## 2017-12-02 DIAGNOSIS — I63411 Cerebral infarction due to embolism of right middle cerebral artery: Secondary | ICD-10-CM | POA: Insufficient documentation

## 2017-12-02 DIAGNOSIS — R2689 Other abnormalities of gait and mobility: Secondary | ICD-10-CM | POA: Diagnosis not present

## 2017-12-02 DIAGNOSIS — M546 Pain in thoracic spine: Secondary | ICD-10-CM | POA: Diagnosis not present

## 2017-12-02 DIAGNOSIS — M6281 Muscle weakness (generalized): Secondary | ICD-10-CM | POA: Diagnosis not present

## 2017-12-02 NOTE — Therapy (Signed)
[facility] [place], [place], [address] Phone: [phone]   Fax:  [phone]  Physical Therapy Treatment  Patient Details  Name: Stacey Ramirez MRN: [id number] Date of Birth: 01/02/1941 No data recorded  Encounter Date: 12/02/2017  PT End of Session - 12/02/17 1447    Visit Number  26    Date for PT Re-Evaluation  12/23/17    PT Start Time  1312    PT Stop Time  1400    PT Time Calculation (min)  48 min    Activity Tolerance  Patient tolerated treatment well    Behavior During Therapy  [facility] for tasks assessed/performed       Past Medical History:  Diagnosis Date  . Adjustment reaction with anxiety 12/20/2015  . Aortic stenosis   . CAD (coronary artery disease) 06/09/2017   Nuc study 10/18: EF 69, inferolateral perfusion defect-possible small infarct with peri-infarct ischemia; intermediate risk // LHC 10/18: pLAD 25, pLCx 50, pRCA 25 >> med Rx  . Carotid stenosis, asymptomatic, bilateral 02/27/2016   Carotid US 5/17: Bilateral ICA 1-39  . Cerebrovascular accident (stroke) ([place])    a. 12/2015 - cryptogenic.  S/P MDT Linq.  . Difficulty walking   . Essential hypertension   . Gait disorder 04/07/2016  . Heart palpitations 09/06/2012  . History of CVA with residual deficit 12/14/2015  . History of dizziness   . History of echocardiogram    Echo 10/18: Vigorous LVF, EF 65-70, normal wall motion, grade 1 diastolic dysfunction, GLS -21.1%, mild RAE  . History of nuclear stress test    Nuclear stress test 10/18: EF 69, inf-lat defect c/w poss infarct with peri-infarct ischemia; Intermediate Risk  . Hyperlipidemia   . Hypothyroidism   . Left hemiparesis ([place])   . LVH (left ventricular hypertrophy)   . Mitral regurgitation    a. 12/2015 Echo: EF 60-65%, mild focal basal hypertrophy. No rwma, triv AI, mild MR, mildly dil LA w/o thrombus. No RA thrombus. No PFO.  Marland Kitchen Palpitations   . Prediabetes   . Stroke-like  symptom 12/13/2015  . SVT (supraventricular tachycardia) ([place]) 12/14/2015  . Vascular parkinsonism ([place]) 06/29/2016  . Vitamin D deficiency     Past Surgical History:  Procedure Laterality Date  . CARDIOVASCULAR STRESS TEST  2002   NORMAL  . EP IMPLANTABLE DEVICE N/A 12/17/2015   Procedure: Loop Recorder Insertion;  Surgeon: [name], MD;  Location: [facility];  Service: Cardiovascular;  Laterality: N/A;  . LEFT HEART CATH AND CORONARY ANGIOGRAPHY N/A 05/25/2017   Procedure: LEFT HEART CATH AND CORONARY ANGIOGRAPHY;  Surgeon: Sherren Mocha, MD;  Location: [facility];  Service: Cardiovascular;  Laterality: N/A;  . LOOP RECORDER IMPLANT    . TEE WITHOUT CARDIOVERSION N/A 12/17/2015   Procedure: TRANSESOPHAGEAL ECHOCARDIOGRAM (TEE);  Surgeon: Lelon Perla, MD;  Location: [facility] ENDOSCOPY;  Service: Cardiovascular;  Laterality: N/A;  Pt also needs a LOOP  . TRANSTHORACIC ECHOCARDIOGRAM  2008   SHOWED LEFT VENTRICULAR HYPERTROPHY AND MILD AORTIC STENOSIS    There were no vitals filed for this visit.  Subjective Assessment - 12/02/17 1313    Subjective  "For what I have I guess I am doing okay"  Patients husband reports he is frustrated with the MD's not being able to say what is wrong and would like to knoe    Currently in Pain?  No/denies  [place] Adult PT Treatment/Exercise - 12/02/17 0001      Ambulation/Gait   Gait Comments  Gait down the hall, she will shuffle and stutter step but is able to make a great change with just a vc of "big steps"  we also went up and down stairs step over step with handrail and light hand hold, we did a lot of gait with me pulling her to go faster and also with a lot of vc's to take big steps.  She does very well with this until she is distracted or has to turn or negotiate around obstacles      High Level Balance   High Level Balance Activities  Negotitating around obstacles;Negotiating over obstacles     High Level Balance Comments  standing on dynamic surface 4" toe taps, ball kicks, ball throws      Knee/Hip Exercises: Aerobic   Nustep  L5 x 7 min                PT Short Term Goals - 09/02/17 1435      PT SHORT TERM GOAL #1   Title  independent with her home HEP    Time  2    Period  Weeks    Status  On-going        PT Long Term Goals - 12/02/17 1451      PT LONG TERM GOAL #1   Title  decrease TUG time to 14 seconds for functional gait and safety    Status  Achieved      PT LONG TERM GOAL #2   Title  increase Berg balance score to 46/56 for safety    Status  Partially Met            Plan - 12/02/17 1447    Clinical Impression Statement  Patient and husband are upset with her ups and downs with her ability to walk and then at times not walk, she did very well today when given cue to take big steps she can do this and not have issues, when she goes to turn or negotiate round obstacles she tends to at times festinate.    PT Next Visit Plan  may continue with the high level balance, does very well with giving cues    Consulted and Agree with Plan of Care  Patient       Patient will benefit from skilled therapeutic intervention in order to improve the following deficits and impairments:  Abnormal gait, Decreased strength, Decreased balance, Difficulty walking, Decreased activity tolerance, Decreased coordination, Decreased mobility  Visit Diagnosis: Muscle weakness (generalized)  Difficulty in walking, not elsewhere classified  Balance problem  Anxiety  Left hemiparesis (HCC)  Stroke due to embolism of right middle cerebral artery [facility])     Problem List Patient Active Problem List   Diagnosis Date Noted  . Psychogenic gait 10/21/2017  . Coronary artery disease involving native coronary artery of native heart without angina pectoris 06/09/2017  . Abnormal nuclear cardiac imaging test 05/25/2017  . PVC's (premature ventricular contractions)  05/17/2017  . Anxiety 12/29/2016  . Vascular parkinsonism ([place]) 06/29/2016  . Gait disorder 04/07/2016  . Intracranial carotid stenosis, bilateral 02/27/2016  . Essential hypertension   . Left-sided weakness 12/28/2015  . Headache 12/28/2015  . Adjustment reaction with anxiety 12/20/2015  . Left hemiparesis ([place])   . History of CVA with residual deficit   . Prediabetes   . SVT (supraventricular tachycardia) ([place]) 12/14/2015  . Stroke-like symptom 12/13/2015  .  History of stroke   . Heart palpitations 09/06/2012  . Hypertension   . Familial hyperlipidemia   . Hypothyroidism   . Vitamin D deficiency   . Palpitations   . History of dizziness   . LVH (left ventricular hypertrophy)   . Aortic stenosis   . Mitral regurgitation   . SOB (shortness of breath)   . Difficulty walking     Sumner Boast., PT 12/02/2017, 2:54 PM  [place] [address] [place], [place], [address] Phone: [phone]   Fax:  [phone]  Name: Stacey Ramirez MRN: [id number] Date of Birth: May 11, 1941

## 2017-12-07 ENCOUNTER — Ambulatory Visit: Payer: PPO | Admitting: Physical Therapy

## 2017-12-07 ENCOUNTER — Encounter: Payer: Self-pay | Admitting: Physical Therapy

## 2017-12-07 DIAGNOSIS — M6281 Muscle weakness (generalized): Secondary | ICD-10-CM | POA: Diagnosis not present

## 2017-12-07 DIAGNOSIS — G8194 Hemiplegia, unspecified affecting left nondominant side: Secondary | ICD-10-CM

## 2017-12-07 DIAGNOSIS — R2689 Other abnormalities of gait and mobility: Secondary | ICD-10-CM

## 2017-12-07 DIAGNOSIS — R262 Difficulty in walking, not elsewhere classified: Secondary | ICD-10-CM

## 2017-12-07 DIAGNOSIS — F419 Anxiety disorder, unspecified: Secondary | ICD-10-CM

## 2017-12-07 NOTE — Therapy (Signed)
[place], [place], [address] Phone: [phone]   Fax:  [phone]  Physical Therapy Treatment  Patient Details  Name: Stacey Ramirez MRN: [id number] Date of Birth: 1941/05/01 No data recorded  Encounter Date: 12/07/2017  PT End of Session - 12/07/17 1349    Visit Number  27    PT Start Time  1300    PT Stop Time  1345    PT Time Calculation (min)  45 min    Activity Tolerance  Patient tolerated treatment well    Behavior During Therapy  [facility] for tasks assessed/performed       Past Medical History:  Diagnosis Date  . Adjustment reaction with anxiety 12/20/2015  . Aortic stenosis   . CAD (coronary artery disease) 06/09/2017   Nuc study 10/18: EF 69, inferolateral perfusion defect-possible small infarct with peri-infarct ischemia; intermediate risk // LHC 10/18: pLAD 25, pLCx 50, pRCA 25 >> med Rx  . Carotid stenosis, asymptomatic, bilateral 02/27/2016   Carotid US 5/17: Bilateral ICA 1-39  . Cerebrovascular accident (stroke) ([place])    a. 12/2015 - cryptogenic.  S/P MDT Linq.  . Difficulty walking   . Essential hypertension   . Gait disorder 04/07/2016  . Heart palpitations 09/06/2012  . History of CVA with residual deficit 12/14/2015  . History of dizziness   . History of echocardiogram    Echo 10/18: Vigorous LVF, EF 65-70, normal wall motion, grade 1 diastolic dysfunction, GLS -21.1%, mild RAE  . History of nuclear stress test    Nuclear stress test 10/18: EF 69, inf-lat defect c/w poss infarct with peri-infarct ischemia; Intermediate Risk  . Hyperlipidemia   . Hypothyroidism   . Left hemiparesis ([place])   . LVH (left ventricular hypertrophy)   . Mitral regurgitation    a. 12/2015 Echo: EF 60-65%, mild focal basal hypertrophy. No rwma, triv AI, mild MR, mildly dil LA w/o thrombus. No RA thrombus. No PFO.  Marland Kitchen Palpitations   . Prediabetes   . Stroke-like symptom 12/13/2015  . SVT (supraventricular  tachycardia) ([place]) 12/14/2015  . Vascular parkinsonism ([place]) 06/29/2016  . Vitamin D deficiency     Past Surgical History:  Procedure Laterality Date  . CARDIOVASCULAR STRESS TEST  2002   NORMAL  . EP IMPLANTABLE DEVICE N/A 12/17/2015   Procedure: Loop Recorder Insertion;  Surgeon: [name], MD;  Location: [place] CV [facility];  Service: Cardiovascular;  Laterality: N/A;  . LEFT HEART CATH AND CORONARY ANGIOGRAPHY N/A 05/25/2017   Procedure: LEFT HEART CATH AND CORONARY ANGIOGRAPHY;  Surgeon: Sherren Mocha, MD;  Location: [place] CV [facility];  Service: Cardiovascular;  Laterality: N/A;  . LOOP RECORDER IMPLANT    . TEE WITHOUT CARDIOVERSION N/A 12/17/2015   Procedure: TRANSESOPHAGEAL ECHOCARDIOGRAM (TEE);  Surgeon: Lelon Perla, MD;  Location: [facility] ENDOSCOPY;  Service: Cardiovascular;  Laterality: N/A;  Pt also needs a LOOP  . TRANSTHORACIC ECHOCARDIOGRAM  2008   SHOWED LEFT VENTRICULAR HYPERTROPHY AND MILD AORTIC STENOSIS    There were no vitals filed for this visit.  Subjective Assessment - 12/07/17 1303    Subjective  "About the same, that leg don't want to move too good, but I make it"    Currently in Pain?  No/denies    Pain Score  0-No pain                       [facility] Adult PT Treatment/Exercise - 12/07/17  0001      Ambulation/Gait   Gait Comments  One flight of stair, one rail R alternating pattern. Posterior learn when ascending stairs holding on rail. Gait outside up hill. Pt gait is good until she starts talking about how she walking.       Knee/Hip Exercises: Aerobic   NuStep  L5 x 7 min       Knee/Hip Exercises: Machines for Strengthening   Cybex Leg Press  20# 2 sets 10, SL 20lb x5  each  some assist with LLE       Knee/Hip Exercises: Standing   Other Standing Knee Exercises  4 in box taps x10 alt, then 5R 5L x2      Knee/Hip Exercises: Seated   Sit to Sand  1 set;10 reps;without UE support min assist at times to bring hips forward                 PT Short Term Goals - 09/02/17 1435      PT SHORT TERM GOAL #1   Title  independent with her home HEP    Time  2    Period  Weeks    Status  On-going        PT Long Term Goals - 12/02/17 1451      PT LONG TERM GOAL #1   Title  decrease TUG time to 14 seconds for functional gait and safety    Status  Achieved      PT LONG TERM GOAL #2   Title  increase Berg balance score to 46/56 for safety    Status  Partially Met            Plan - 12/07/17 1350    Clinical Impression Statement  Pt did very well overall with walking. At times she shuffles her feet, she is very hyper verbal, when talking she often times loses focus and starts to shuffle feet more. Some assist needed with SL on leg press. Cues to slow down with alt box taps.     PT Treatment/Interventions  Stair training;Gait training;Functional mobility training;Therapeutic activities;Therapeutic exercise;Balance training;Patient/family education    PT Next Visit Plan  may continue with the high level balance, does very well with giving cues       Patient will benefit from skilled therapeutic intervention in order to improve the following deficits and impairments:  Abnormal gait, Decreased strength, Decreased balance, Difficulty walking, Decreased activity tolerance, Decreased coordination, Decreased mobility  Visit Diagnosis: Muscle weakness (generalized)  Difficulty in walking, not elsewhere classified  Anxiety  Balance problem  Left hemiparesis [facility])     Problem List Patient Active Problem List   Diagnosis Date Noted  . Psychogenic gait 10/21/2017  . Coronary artery disease involving native coronary artery of native heart without angina pectoris 06/09/2017  . Abnormal nuclear cardiac imaging test 05/25/2017  . PVC's (premature ventricular contractions) 05/17/2017  . Anxiety 12/29/2016  . Vascular parkinsonism ([place]) 06/29/2016  . Gait disorder 04/07/2016  . Intracranial carotid  stenosis, bilateral 02/27/2016  . Essential hypertension   . Left-sided weakness 12/28/2015  . Headache 12/28/2015  . Adjustment reaction with anxiety 12/20/2015  . Left hemiparesis ([place])   . History of CVA with residual deficit   . Prediabetes   . SVT (supraventricular tachycardia) ([place]) 12/14/2015  . Stroke-like symptom 12/13/2015  . History of stroke   . Heart palpitations 09/06/2012  . Hypertension   . Familial hyperlipidemia   . Hypothyroidism   . Vitamin D  deficiency   . Palpitations   . History of dizziness   . LVH (left ventricular hypertrophy)   . Aortic stenosis   . Mitral regurgitation   . SOB (shortness of breath)   . Difficulty walking     Scot Jun, PTA 12/07/2017, 1:51 PM  [place], [place], [address] Phone: [phone]   Fax:  [phone]  Name: Stacey Ramirez MRN: [id number] Date of Birth: [birth year]

## 2017-12-09 ENCOUNTER — Ambulatory Visit: Payer: PPO | Admitting: Physical Therapy

## 2017-12-09 DIAGNOSIS — M6281 Muscle weakness (generalized): Secondary | ICD-10-CM

## 2017-12-09 DIAGNOSIS — F419 Anxiety disorder, unspecified: Secondary | ICD-10-CM

## 2017-12-09 DIAGNOSIS — G8194 Hemiplegia, unspecified affecting left nondominant side: Secondary | ICD-10-CM

## 2017-12-09 DIAGNOSIS — R2689 Other abnormalities of gait and mobility: Secondary | ICD-10-CM

## 2017-12-09 DIAGNOSIS — R262 Difficulty in walking, not elsewhere classified: Secondary | ICD-10-CM

## 2017-12-09 NOTE — Therapy (Signed)
[place], [place], [address] Phone: [phone]   Fax:  [phone]  Physical Therapy Treatment  Patient Details  Name: Stacey Ramirez MRN: [id number] Date of Birth: [birth year] No data recorded  Encounter Date: 12/09/2017  PT End of Session - 12/09/17 1459    Visit Number  28    Date for PT Re-Evaluation  12/23/17    PT Start Time  1440    PT Stop Time  1526    PT Time Calculation (min)  46 min       Past Medical History:  Diagnosis Date  . Adjustment reaction with anxiety 12/20/2015  . Aortic stenosis   . CAD (coronary artery disease) 06/09/2017   Nuc study 10/18: EF 69, inferolateral perfusion defect-possible small infarct with peri-infarct ischemia; intermediate risk // LHC 10/18: pLAD 25, pLCx 50, pRCA 25 >> med Rx  . Carotid stenosis, asymptomatic, bilateral 02/27/2016   Carotid US 5/17: Bilateral ICA 1-39  . Cerebrovascular accident (stroke) ([place])    a. 12/2015 - cryptogenic.  S/P MDT Linq.  . Difficulty walking   . Essential hypertension   . Gait disorder 04/07/2016  . Heart palpitations 09/06/2012  . History of CVA with residual deficit 12/14/2015  . History of dizziness   . History of echocardiogram    Echo 10/18: Vigorous LVF, EF 65-70, normal wall motion, grade 1 diastolic dysfunction, GLS -21.1%, mild RAE  . History of nuclear stress test    Nuclear stress test 10/18: EF 69, inf-lat defect c/w poss infarct with peri-infarct ischemia; Intermediate Risk  . Hyperlipidemia   . Hypothyroidism   . Left hemiparesis ([place])   . LVH (left ventricular hypertrophy)   . Mitral regurgitation    a. 12/2015 Echo: EF 60-65%, mild focal basal hypertrophy. No rwma, triv AI, mild MR, mildly dil LA w/o thrombus. No RA thrombus. No PFO.  Marland Kitchen Palpitations   . Prediabetes   . Stroke-like symptom 12/13/2015  . SVT (supraventricular tachycardia) ([place]) 12/14/2015  . Vascular parkinsonism ([place]) 06/29/2016  .  Vitamin D deficiency     Past Surgical History:  Procedure Laterality Date  . CARDIOVASCULAR STRESS TEST  2002   NORMAL  . EP IMPLANTABLE DEVICE N/A 12/17/2015   Procedure: Loop Recorder Insertion;  Surgeon: [name], MD;  Location: [place] [facility];  Service: Cardiovascular;  Laterality: N/A;  . LEFT HEART CATH AND CORONARY ANGIOGRAPHY N/A 05/25/2017   Procedure: LEFT HEART CATH AND CORONARY ANGIOGRAPHY;  Surgeon: Sherren Mocha, MD;  Location: [place] CV LAB;  Service: Cardiovascular;  Laterality: N/A;  . LOOP RECORDER IMPLANT    . TEE WITHOUT CARDIOVERSION N/A 12/17/2015   Procedure: TRANSESOPHAGEAL ECHOCARDIOGRAM (TEE);  Surgeon: Lelon Perla, MD;  Location: [facility] ENDOSCOPY;  Service: Cardiovascular;  Laterality: N/A;  Pt also needs a LOOP  . TRANSTHORACIC ECHOCARDIOGRAM  2008   SHOWED LEFT VENTRICULAR HYPERTROPHY AND MILD AORTIC STENOSIS    There were no vitals filed for this visit.  Subjective Assessment - 12/09/17 1440    Subjective  doing okay- but walked better yesterday. trying another cholesterol medicine- legs feel alittle week    Currently in Pain?  No/denies                       [facility] Adult PT Treatment/Exercise - 12/09/17 0001      Berg Balance Test   Sit to Stand  Able to stand without using hands  and stabilize independently    Standing Unsupported  Able to stand safely 2 minutes    Sitting with Back Unsupported but Feet Supported on Floor or Stool  Able to sit safely and securely 2 minutes    Stand to Sit  Controls descent by using hands    Transfers  Able to transfer safely, definite need of hands    Standing Unsupported with Eyes Closed  Able to stand 10 seconds safely    Standing Ubsupported with Feet Together  Able to place feet together independently and stand for 1 minute with supervision    From Standing, Reach Forward with Outstretched Arm  Can reach forward >12 cm safely (5")    From Standing Position, Pick up Object from Floor   Able to pick up shoe, needs supervision    From Standing Position, Turn to Look Behind Over each Shoulder  Looks behind from both sides and weight shifts well    Turn 360 Degrees  Able to turn 360 degrees safely one side only in 4 seconds or less    Standing Unsupported, Alternately Place Feet on Step/Stool  Able to complete 4 steps without aid or supervision    Standing Unsupported, One Foot in [employer] balance while stepping or standing    Standing on One Leg  Able to lift leg independently and hold equal to or more than 3 seconds    Total Score  42      Knee/Hip Exercises: Aerobic   Nustep  L5 x 7 min       Knee/Hip Exercises: Machines for Strengthening   Cybex Knee Extension  10lbs 2x10    Cybex Knee Flexion  20# 2 sets 15      Knee/Hip Exercises: Standing   Walking with Sports Cord  20# fwd/back x3 and  side stepping 3x each      Knee/Hip Exercises: Seated   Ball Squeeze  20    Clamshell with TheraBand  Green 20    Other Seated Knee/Hip Exercises  marching green tband 20 each    Sit to Sand  1 set;10 reps;without UE support min A and VCing to stay fwd               PT Short Term Goals - 09/02/17 1435      PT SHORT TERM GOAL #1   Title  independent with her home HEP    Time  2    Period  Weeks    Status  On-going        PT Long Term Goals - 12/09/17 1454      PT LONG TERM GOAL #2   Title  increase Berg balance score to 46/56 for safety    Baseline  42/56    Status  Partially Met      PT LONG TERM GOAL #3   Title  increase LE strength (particularly ABD and ext.) to 4/5 for functional gait and safety    Baseline  4/5 hips and knees tested in sitting    Status  Achieved      PT LONG TERM GOAL #4   Title  be able to do a sit to stand without assistance and without losing balance for functional use    Status  Achieved            Plan - 12/09/17 1455    Clinical Impression Statement  BERG increased to 42/56 from 26. MMT goal met. Pt back to where  she was  about at 1 month ago prior to "latest episode". Pt still with balance and gait issues requiring assistance. Focus session on LE strengthening today.    PT Treatment/Interventions  Stair training;Gait training;Functional mobility training;Therapeutic activities;Therapeutic exercise;Balance training;Patient/family education    PT Next Visit Plan  continue with the high level balance and gait, does very well with giving cues       Patient will benefit from skilled therapeutic intervention in order to improve the following deficits and impairments:  Abnormal gait, Decreased strength, Decreased balance, Difficulty walking, Decreased activity tolerance, Decreased coordination, Decreased mobility  Visit Diagnosis: Muscle weakness (generalized)  Difficulty in walking, not elsewhere classified  Anxiety  Balance problem  Left hemiparesis [facility])     Problem List Patient Active Problem List   Diagnosis Date Noted  . Psychogenic gait 10/21/2017  . Coronary artery disease involving native coronary artery of native heart without angina pectoris 06/09/2017  . Abnormal nuclear cardiac imaging test 05/25/2017  . PVC's (premature ventricular contractions) 05/17/2017  . Anxiety 12/29/2016  . Vascular parkinsonism ([place]) 06/29/2016  . Gait disorder 04/07/2016  . Intracranial carotid stenosis, bilateral 02/27/2016  . Essential hypertension   . Left-sided weakness 12/28/2015  . Headache 12/28/2015  . Adjustment reaction with anxiety 12/20/2015  . Left hemiparesis ([place])   . History of CVA with residual deficit   . Prediabetes   . SVT (supraventricular tachycardia) ([place]) 12/14/2015  . Stroke-like symptom 12/13/2015  . History of stroke   . Heart palpitations 09/06/2012  . Hypertension   . Familial hyperlipidemia   . Hypothyroidism   . Vitamin D deficiency   . Palpitations   . History of dizziness   . LVH (left ventricular hypertrophy)   . Aortic stenosis   . Mitral regurgitation   .  SOB (shortness of breath)   . Difficulty walking     Jaideep Pollack,ANGIE PTA 12/09/2017, 3:17 PM  [place], [place], [address] Phone: [phone]   Fax:  [phone]  Name: Stacey Ramirez MRN: [id number] Date of Birth: 02-24-1941

## 2017-12-15 LAB — CUP PACEART REMOTE DEVICE CHECK
Implantable Pulse Generator Implant Date: 20170516
MDC IDC SESS DTM: 20190413113952

## 2017-12-16 ENCOUNTER — Ambulatory Visit: Payer: PPO | Admitting: Physical Therapy

## 2017-12-16 ENCOUNTER — Ambulatory Visit (INDEPENDENT_AMBULATORY_CARE_PROVIDER_SITE_OTHER): Payer: PPO | Admitting: *Deleted

## 2017-12-16 DIAGNOSIS — M6281 Muscle weakness (generalized): Secondary | ICD-10-CM | POA: Diagnosis not present

## 2017-12-16 DIAGNOSIS — R2689 Other abnormalities of gait and mobility: Secondary | ICD-10-CM

## 2017-12-16 DIAGNOSIS — I639 Cerebral infarction, unspecified: Secondary | ICD-10-CM

## 2017-12-16 DIAGNOSIS — R262 Difficulty in walking, not elsewhere classified: Secondary | ICD-10-CM

## 2017-12-16 NOTE — Therapy (Signed)
[place], [place], [address] Phone: [phone]   Fax:  [phone]  Physical Therapy Treatment  Patient Details  Name: Stacey Ramirez MRN: [id number] Date of Birth: 1941/05/19 No data recorded  Encounter Date: 12/16/2017  PT End of Session - 12/16/17 1603    Visit Number  29    Date for PT Re-Evaluation  12/23/17    PT Start Time  1525    PT Stop Time  1618    PT Time Calculation (min)  53 min       Past Medical History:  Diagnosis Date  . Adjustment reaction with anxiety 12/20/2015  . Aortic stenosis   . CAD (coronary artery disease) 06/09/2017   Nuc study 10/18: EF 69, inferolateral perfusion defect-possible small infarct with peri-infarct ischemia; intermediate risk // LHC 10/18: pLAD 25, pLCx 50, pRCA 25 >> med Rx  . Carotid stenosis, asymptomatic, bilateral 02/27/2016   Carotid US 5/17: Bilateral ICA 1-39  . Cerebrovascular accident (stroke) ([place])    a. 12/2015 - cryptogenic.  S/P MDT Linq.  . Difficulty walking   . Essential hypertension   . Gait disorder 04/07/2016  . Heart palpitations 09/06/2012  . History of CVA with residual deficit 12/14/2015  . History of dizziness   . History of echocardiogram    Echo 10/18: Vigorous LVF, EF 65-70, normal wall motion, grade 1 diastolic dysfunction, GLS -21.1%, mild RAE  . History of nuclear stress test    Nuclear stress test 10/18: EF 69, inf-lat defect c/w poss infarct with peri-infarct ischemia; Intermediate Risk  . Hyperlipidemia   . Hypothyroidism   . Left hemiparesis ([place])   . LVH (left ventricular hypertrophy)   . Mitral regurgitation    a. 12/2015 Echo: EF 60-65%, mild focal basal hypertrophy. No rwma, triv AI, mild MR, mildly dil LA w/o thrombus. No RA thrombus. No PFO.  Marland Kitchen Palpitations   . Prediabetes   . Stroke-like symptom 12/13/2015  . SVT (supraventricular tachycardia) ([place]) 12/14/2015  . Vascular parkinsonism ([place]) 06/29/2016  .  Vitamin D deficiency     Past Surgical History:  Procedure Laterality Date  . CARDIOVASCULAR STRESS TEST  2002   NORMAL  . EP IMPLANTABLE DEVICE N/A 12/17/2015   Procedure: Loop Recorder Insertion;  Surgeon: [name], MD;  Location: [place] CV LAB;  Service: Cardiovascular;  Laterality: N/A;  . LEFT HEART CATH AND CORONARY ANGIOGRAPHY N/A 05/25/2017   Procedure: LEFT HEART CATH AND CORONARY ANGIOGRAPHY;  Surgeon: Sherren Mocha, MD;  Location: [place] CV LAB;  Service: Cardiovascular;  Laterality: N/A;  . LOOP RECORDER IMPLANT    . TEE WITHOUT CARDIOVERSION N/A 12/17/2015   Procedure: TRANSESOPHAGEAL ECHOCARDIOGRAM (TEE);  Surgeon: Lelon Perla, MD;  Location: [facility];  Service: Cardiovascular;  Laterality: N/A;  Pt also needs a LOOP  . TRANSTHORACIC ECHOCARDIOGRAM  2008   SHOWED LEFT VENTRICULAR HYPERTROPHY AND MILD AORTIC STENOSIS    There were no vitals filed for this visit.  Subjective Assessment - 12/16/17 1525    Subjective  doing okay- ups and downs. spine MD found nothing so dismissed me    Currently in Pain?  No/denies                       [facility] Adult PT Treatment/Exercise - 12/16/17 0001      High Level Balance   High Level Balance Activities  Negotitating around obstacles;Negotiating over obstacles;Direction changes;Turns;Sudden stops  compliant and non compliant surfaces    High Level Balance Comments  assistance needed difficulty with transitions and turning ball toss and ball kicking      Knee/Hip Exercises: Aerobic   Recumbent Bike  6 min      Knee/Hip Exercises: Machines for Strengthening   Cybex Knee Extension  10lbs 2x10    Cybex Knee Flexion  20# 2 sets 15    Cybex Leg Press  20# 3 sets 10               PT Short Term Goals - 09/02/17 1435      PT SHORT TERM GOAL #1   Title  independent with her home HEP    Time  2    Period  Weeks    Status  On-going        PT Long Term Goals - 12/16/17 1534      PT  LONG TERM GOAL #1   Title  decrease TUG time to 14 seconds for functional gait and safety    Status  Achieved      PT LONG TERM GOAL #2   Title  increase Berg balance score to 46/56 for safety    Status  Partially Met      PT LONG TERM GOAL #3   Title  increase LE strength (particularly ABD and ext.) to 4/5 for functional gait and safety    Status  Achieved      PT LONG TERM GOAL #4   Title  be able to do a sit to stand without assistance and without losing balance for functional use    Status  Achieved            Plan - 12/16/17 1535    Clinical Impression Statement  continue to work func ,balance and strength for increase independance at home. still difficulty with transition surface and turns. pt still with decreased righting reaction to LOB.progressing with goals    PT Treatment/Interventions  Stair training;Gait training;Functional mobility training;Therapeutic activities;Therapeutic exercise;Balance training;Patient/family education    PT Next Visit Plan  HIGH LEVEL BALANCE       Patient will benefit from skilled therapeutic intervention in order to improve the following deficits and impairments:  Abnormal gait, Decreased strength, Decreased balance, Difficulty walking, Decreased activity tolerance, Decreased coordination, Decreased mobility  Visit Diagnosis: Muscle weakness (generalized)  Difficulty in walking, not elsewhere classified  Balance problem     Problem List Patient Active Problem List   Diagnosis Date Noted  . Psychogenic gait 10/21/2017  . Coronary artery disease involving native coronary artery of native heart without angina pectoris 06/09/2017  . Abnormal nuclear cardiac imaging test 05/25/2017  . PVC's (premature ventricular contractions) 05/17/2017  . Anxiety 12/29/2016  . Vascular parkinsonism ([place]) 06/29/2016  . Gait disorder 04/07/2016  . Intracranial carotid stenosis, bilateral 02/27/2016  . Essential hypertension   . Left-sided  weakness 12/28/2015  . Headache 12/28/2015  . Adjustment reaction with anxiety 12/20/2015  . Left hemiparesis ([place])   . History of CVA with residual deficit   . Prediabetes   . SVT (supraventricular tachycardia) ([place]) 12/14/2015  . Stroke-like symptom 12/13/2015  . History of stroke   . Heart palpitations 09/06/2012  . Hypertension   . Familial hyperlipidemia   . Hypothyroidism   . Vitamin D deficiency   . Palpitations   . History of dizziness   . LVH (left ventricular hypertrophy)   . Aortic stenosis   . Mitral regurgitation   .  SOB (shortness of breath)   . Difficulty walking     Haddy Mullinax,ANGIE PTA 12/16/2017, 4:05 PM  [place], [place], [address] Phone: [phone]   Fax:  [phone]  Name: Stacey Ramirez MRN: [id number] Date of Birth: 28-Nov-1940

## 2017-12-16 NOTE — Progress Notes (Signed)
Carelink Summary Report / Loop Recorder 

## 2017-12-17 ENCOUNTER — Ambulatory Visit: Payer: PPO | Admitting: Physical Therapy

## 2017-12-17 ENCOUNTER — Encounter: Payer: Self-pay | Admitting: Physical Therapy

## 2017-12-17 DIAGNOSIS — R262 Difficulty in walking, not elsewhere classified: Secondary | ICD-10-CM

## 2017-12-17 DIAGNOSIS — M6281 Muscle weakness (generalized): Secondary | ICD-10-CM | POA: Diagnosis not present

## 2017-12-17 DIAGNOSIS — R2689 Other abnormalities of gait and mobility: Secondary | ICD-10-CM

## 2017-12-17 NOTE — Therapy (Signed)
[place] [address], [place], [address] Phone: [phone]   Fax:  [phone]  Physical Therapy Treatment  Patient Details  Name: Stacey Ramirez MRN: [id number] Date of Birth: 10-11-1940 No data recorded  Encounter Date: 12/17/2017  PT End of Session - 12/17/17 1150    Visit Number  30    Date for PT Re-Evaluation  12/23/17    PT Start Time  1100    PT Stop Time  1150    PT Time Calculation (min)  50 min    Activity Tolerance  Patient tolerated treatment well    Behavior During Therapy  [facility] for tasks assessed/performed       Past Medical History:  Diagnosis Date  . Adjustment reaction with anxiety 12/20/2015  . Aortic stenosis   . CAD (coronary artery disease) 06/09/2017   Nuc study 10/18: EF 69, inferolateral perfusion defect-possible small infarct with peri-infarct ischemia; intermediate risk // LHC 10/18: pLAD 25, pLCx 50, pRCA 25 >> med Rx  . Carotid stenosis, asymptomatic, bilateral 02/27/2016   Carotid US 5/17: Bilateral ICA 1-39  . Cerebrovascular accident (stroke) ([place])    a. 12/2015 - cryptogenic.  S/P MDT Linq.  . Difficulty walking   . Essential hypertension   . Gait disorder 04/07/2016  . Heart palpitations 09/06/2012  . History of CVA with residual deficit 12/14/2015  . History of dizziness   . History of echocardiogram    Echo 10/18: Vigorous LVF, EF 65-70, normal wall motion, grade 1 diastolic dysfunction, GLS -21.1%, mild RAE  . History of nuclear stress test    Nuclear stress test 10/18: EF 69, inf-lat defect c/w poss infarct with peri-infarct ischemia; Intermediate Risk  . Hyperlipidemia   . Hypothyroidism   . Left hemiparesis ([place])   . LVH (left ventricular hypertrophy)   . Mitral regurgitation    a. 12/2015 Echo: EF 60-65%, mild focal basal hypertrophy. No rwma, triv AI, mild MR, mildly dil LA w/o thrombus. No RA thrombus. No PFO.  Marland Kitchen Palpitations   . Prediabetes   . Stroke-like  symptom 12/13/2015  . SVT (supraventricular tachycardia) ([place]) 12/14/2015  . Vascular parkinsonism ([place]) 06/29/2016  . Vitamin D deficiency     Past Surgical History:  Procedure Laterality Date  . CARDIOVASCULAR STRESS TEST  2002   NORMAL  . EP IMPLANTABLE DEVICE N/A 12/17/2015   Procedure: Loop Recorder Insertion;  Surgeon: [name], MD;  Location: [facility];  Service: Cardiovascular;  Laterality: N/A;  . LEFT HEART CATH AND CORONARY ANGIOGRAPHY N/A 05/25/2017   Procedure: LEFT HEART CATH AND CORONARY ANGIOGRAPHY;  Surgeon: Sherren Mocha, MD;  Location: [facility];  Service: Cardiovascular;  Laterality: N/A;  . LOOP RECORDER IMPLANT    . TEE WITHOUT CARDIOVERSION N/A 12/17/2015   Procedure: TRANSESOPHAGEAL ECHOCARDIOGRAM (TEE);  Surgeon: Lelon Perla, MD;  Location: [facility] END[facility];  Service: Cardiovascular;  Laterality: N/A;  Pt also needs a LOOP  . TRANSTHORACIC ECHOCARDIOGRAM  2008   SHOWED LEFT VENTRICULAR HYPERTROPHY AND MILD AORTIC STENOSIS    There were no vitals filed for this visit.  Subjective Assessment - 12/17/17 1105    Subjective  "Good enough, I just haven't been able to walk too good"    Currently in Pain?  No/denies    Pain Score  0-No pain                       [facility]  Adult PT Treatment/Exercise - 12/17/17 0001      High Level Balance   High Level Balance Comments  Alt taps on foam roll, lateral taps to foam roll, rev taps LLE to 2 inch box X 10 each      Knee/Hip Exercises: Aerobic   Nustep  L5 x 7 min       Knee/Hip Exercises: Machines for Strengthening   Cybex Knee Extension  10lbs 2x10 LLE 5lb 2x10    Cybex Knee Flexion  20# 2 sets 15; 10lb LLE 2x10     Cybex Leg Press  20# 3 sets 10      Knee/Hip Exercises: Standing   Walking with Sports Cord  10# fwd/back x3 and  side stepping 3x each               PT Short Term Goals - 09/02/17 1435      PT SHORT TERM GOAL #1   Title  independent with her home HEP     Time  2    Period  Weeks    Status  On-going        PT Long Term Goals - 12/16/17 1534      PT LONG TERM GOAL #1   Title  decrease TUG time to 14 seconds for functional gait and safety    Status  Achieved      PT LONG TERM GOAL #2   Title  increase Berg balance score to 46/56 for safety    Status  Partially Met      PT LONG TERM GOAL #3   Title  increase LE strength (particularly ABD and ext.) to 4/5 for functional gait and safety    Status  Achieved      PT LONG TERM GOAL #4   Title  be able to do a sit to stand without assistance and without losing balance for functional use    Status  Achieved            Plan - 12/17/17 1151    Clinical Impression Statement  difficulty with transition surfaces and turning remains. She does have good strength ans ROM with all machine level strengthening exercises. instability with resisted sport cord walking. LOB X1 with alternating toe tap due to posterior lean.    Rehab Potential  Good    PT Frequency  2x / week    PT Duration  8 weeks    PT Treatment/Interventions  Stair training;Gait training;Functional mobility training;Therapeutic activities;Therapeutic exercise;Balance training;Patient/family education    PT Next Visit Plan  HIGH LEVEL BALANCE       Patient will benefit from skilled therapeutic intervention in order to improve the following deficits and impairments:  Abnormal gait, Decreased strength, Decreased balance, Difficulty walking, Decreased activity tolerance, Decreased coordination, Decreased mobility  Visit Diagnosis: Muscle weakness (generalized)  Difficulty in walking, not elsewhere classified  Balance problem     Problem List Patient Active Problem List   Diagnosis Date Noted  . Psychogenic gait 10/21/2017  . Coronary artery disease involving native coronary artery of native heart without angina pectoris 06/09/2017  . Abnormal nuclear cardiac imaging test 05/25/2017  . PVC's (premature ventricular  contractions) 05/17/2017  . Anxiety 12/29/2016  . Vascular parkinsonism ([place]) 06/29/2016  . Gait disorder 04/07/2016  . Intracranial carotid stenosis, bilateral 02/27/2016  . Essential hypertension   . Left-sided weakness 12/28/2015  . Headache 12/28/2015  . Adjustment reaction with anxiety 12/20/2015  . Left hemiparesis ([place])   . History of  CVA with residual deficit   . Prediabetes   . SVT (supraventricular tachycardia) ([place]) 12/14/2015  . Stroke-like symptom 12/13/2015  . History of stroke   . Heart palpitations 09/06/2012  . Hypertension   . Familial hyperlipidemia   . Hypothyroidism   . Vitamin D deficiency   . Palpitations   . History of dizziness   . LVH (left ventricular hypertrophy)   . Aortic stenosis   . Mitral regurgitation   . SOB (shortness of breath)   . Difficulty walking     Scot Jun, PTA 12/17/2017, 11:53 AM  [address] [place] [place], [place], [address] Phone: [phone]   Fax:  [phone]  Name: Stacey Ramirez MRN: [id number] Date of Birth: [birth year]

## 2017-12-21 ENCOUNTER — Ambulatory Visit: Payer: PPO | Admitting: Physical Therapy

## 2017-12-21 DIAGNOSIS — M6281 Muscle weakness (generalized): Secondary | ICD-10-CM

## 2017-12-21 DIAGNOSIS — R2689 Other abnormalities of gait and mobility: Secondary | ICD-10-CM

## 2017-12-21 DIAGNOSIS — R262 Difficulty in walking, not elsewhere classified: Secondary | ICD-10-CM

## 2017-12-21 NOTE — Therapy (Signed)
[place] [address] [place], [place], [address] Phone: [phone]   Fax:  [phone]  Physical Therapy Treatment  Patient Details  Name: Stacey Ramirez MRN: [id number] Date of Birth: September 20, 1940 No data recorded  Encounter Date: 12/21/2017  PT End of Session - 12/21/17 1330    Visit Number  31    Date for PT Re-Evaluation  12/23/17    PT Start Time  1315    PT Stop Time  1400    PT Time Calculation (min)  45 min       Past Medical History:  Diagnosis Date  . Adjustment reaction with anxiety 12/20/2015  . Aortic stenosis   . CAD (coronary artery disease) 06/09/2017   Nuc study 10/18: EF 69, inferolateral perfusion defect-possible small infarct with peri-infarct ischemia; intermediate risk // LHC 10/18: pLAD 25, pLCx 50, pRCA 25 >> med Rx  . Carotid stenosis, asymptomatic, bilateral 02/27/2016   Carotid US 5/17: Bilateral ICA 1-39  . Cerebrovascular accident (stroke) ([place])    a. 12/2015 - cryptogenic.  S/P MDT Linq.  . Difficulty walking   . Essential hypertension   . Gait disorder 04/07/2016  . Heart palpitations 09/06/2012  . History of CVA with residual deficit 12/14/2015  . History of dizziness   . History of echocardiogram    Echo 10/18: Vigorous LVF, EF 65-70, normal wall motion, grade 1 diastolic dysfunction, GLS -21.1%, mild RAE  . History of nuclear stress test    Nuclear stress test 10/18: EF 69, inf-lat defect c/w poss infarct with peri-infarct ischemia; Intermediate Risk  . Hyperlipidemia   . Hypothyroidism   . Left hemiparesis ([place])   . LVH (left ventricular hypertrophy)   . Mitral regurgitation    a. 12/2015 Echo: EF 60-65%, mild focal basal hypertrophy. No rwma, triv AI, mild MR, mildly dil LA w/o thrombus. No RA thrombus. No PFO.  Marland Kitchen Palpitations   . Prediabetes   . Stroke-like symptom 12/13/2015  . SVT (supraventricular tachycardia) ([place]) 12/14/2015  . Vascular parkinsonism ([place]) 06/29/2016  .  Vitamin D deficiency     Past Surgical History:  Procedure Laterality Date  . CARDIOVASCULAR STRESS TEST  2002   NORMAL  . EP IMPLANTABLE DEVICE N/A 12/17/2015   Procedure: Loop Recorder Insertion;  Surgeon: [name], MD;  Location: [facility];  Service: Cardiovascular;  Laterality: N/A;  . LEFT HEART CATH AND CORONARY ANGIOGRAPHY N/A 05/25/2017   Procedure: LEFT HEART CATH AND CORONARY ANGIOGRAPHY;  Surgeon: Sherren Mocha, MD;  Location: [facility];  Service: Cardiovascular;  Laterality: N/A;  . LOOP RECORDER IMPLANT    . TEE WITHOUT CARDIOVERSION N/A 12/17/2015   Procedure: TRANSESOPHAGEAL ECHOCARDIOGRAM (TEE);  Surgeon: Lelon Perla, MD;  Location: [facility] ENDOSCOPY;  Service: Cardiovascular;  Laterality: N/A;  Pt also needs a LOOP  . TRANSTHORACIC ECHOCARDIOGRAM  2008   SHOWED LEFT VENTRICULAR HYPERTROPHY AND MILD AORTIC STENOSIS    There were no vitals filed for this visit.  Subjective Assessment - 12/21/17 1325    Subjective  at times better but mostly the same    Currently in Pain?  No/denies                       [facility] Adult PT Treatment/Exercise - 12/21/17 0001      Ambulation/Gait   Gait Comments  reciprocal gait with sticks tio increase speed      Knee/Hip Exercises: Aerobic  Nustep  L5 x 7 min       Knee/Hip Exercises: Machines for Strengthening   Cybex Knee Extension  10lbs 2x10 LLE 5lb 1x10    Cybex Knee Flexion  20# 2 sets 10; 10lb LLE 1x10     Cybex Leg Press  20# 3 sets 10      Knee/Hip Exercises: Standing   Walking with Sports Cord  black band fwd/back and side stepping several LOB with goo drighting reaction but needed assistanc    Other Standing Knee Exercises  6 inch alt box taps stepping fwd and SW over foam roll      Knee/Hip Exercises: Seated   Sit to Sand  1 set;10 reps;without UE support               PT Short Term Goals - 09/02/17 1435      PT SHORT TERM GOAL #1   Title  independent with her home  HEP    Time  2    Period  Weeks    Status  On-going        PT Long Term Goals - 12/16/17 1534      PT LONG TERM GOAL #1   Title  decrease TUG time to 14 seconds for functional gait and safety    Status  Achieved      PT LONG TERM GOAL #2   Title  increase Berg balance score to 46/56 for safety    Status  Partially Met      PT LONG TERM GOAL #3   Title  increase LE strength (particularly ABD and ext.) to 4/5 for functional gait and safety    Status  Achieved      PT LONG TERM GOAL #4   Title  be able to do a sit to stand without assistance and without losing balance for functional use    Status  Achieved            Plan - 12/21/17 1331    Clinical Impression Statement  increased righting reaction with LOB but still needing assistance. SOB and fatigue with activtiy    PT Treatment/Interventions  Stair training;Gait training;Functional mobility training;Therapeutic activities;Therapeutic exercise;Balance training;Patient/family education    PT Next Visit Plan  D/C       Patient will benefit from skilled therapeutic intervention in order to improve the following deficits and impairments:  Abnormal gait, Decreased strength, Decreased balance, Difficulty walking, Decreased activity tolerance, Decreased coordination, Decreased mobility  Visit Diagnosis: Muscle weakness (generalized)  Difficulty in walking, not elsewhere classified  Balance problem     Problem List Patient Active Problem List   Diagnosis Date Noted  . Psychogenic gait 10/21/2017  . Coronary artery disease involving native coronary artery of native heart without angina pectoris 06/09/2017  . Abnormal nuclear cardiac imaging test 05/25/2017  . PVC's (premature ventricular contractions) 05/17/2017  . Anxiety 12/29/2016  . 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 Gait disorder 04/07/2016  . Intracranial carotid stenosis, bilateral 02/27/2016  . Essential hypertension   . Left-sided weakness  12/28/2015  . Headache 12/28/2015  . Adjustment reaction with anxiety 12/20/2015  . Left hemiparesis ([place])   . History of CVA with residual deficit   . Prediabetes   . SVT (supraventricular tachycardia) ([place]) 12/14/2015  . Stroke-like symptom 12/13/2015  . History of stroke   . Heart palpitations 09/06/2012  . Hypertension   . Familial hyperlipidemia   . Hypothyroidism   . Vitamin D deficiency   . Palpitations   .  History of dizziness   . LVH (left ventricular hypertrophy)   . Aortic stenosis   . Mitral regurgitation   . SOB (shortness of breath)   . Difficulty walking     PAYSEUR,ANGIE PTA 12/21/2017, 1:41 PM  [place] [address] [place], [place], [address] Phone: [phone]   Fax:  [phone]  Name: ROSELIE CIRIGLIANO MRN: [id number] Date of Birth: June 10, 1941

## 2017-12-23 ENCOUNTER — Ambulatory Visit: Payer: PPO | Admitting: Physical Therapy

## 2017-12-23 ENCOUNTER — Ambulatory Visit: Payer: PPO | Admitting: Genetic Counselor

## 2017-12-23 DIAGNOSIS — R262 Difficulty in walking, not elsewhere classified: Secondary | ICD-10-CM

## 2017-12-23 DIAGNOSIS — M6281 Muscle weakness (generalized): Secondary | ICD-10-CM | POA: Diagnosis not present

## 2017-12-23 DIAGNOSIS — R2689 Other abnormalities of gait and mobility: Secondary | ICD-10-CM

## 2017-12-23 NOTE — Therapy (Signed)
[place] [address] [place], [place], [address] Phone: [phone]   Fax:  [phone]  Physical Therapy Treatment  Patient Details  Name: Stacey Ramirez MRN: [id number] Date of Birth: 12/12/1940 No data recorded  Encounter Date: 12/23/2017  PT End of Session - 12/23/17 1558    Visit Number  32    Date for PT Re-Evaluation  12/23/17    PT Start Time  1440    PT Stop Time  1530    PT Time Calculation (min)  50 min       Past Medical History:  Diagnosis Date  . Adjustment reaction with anxiety 12/20/2015  . Aortic stenosis   . CAD (coronary artery disease) 06/09/2017   Nuc study 10/18: EF 69, inferolateral perfusion defect-possible small infarct with peri-infarct ischemia; intermediate risk // LHC 10/18: pLAD 25, pLCx 50, pRCA 25 >> med Rx  . Carotid stenosis, asymptomatic, bilateral 02/27/2016   Carotid US 5/17: Bilateral ICA 1-39  . Cerebrovascular accident (stroke) ([place])    a. 12/2015 - cryptogenic.  S/P MDT Linq.  . Difficulty walking   . Essential hypertension   . Gait disorder 04/07/2016  . Heart palpitations 09/06/2012  . History of CVA with residual deficit 12/14/2015  . History of dizziness   . History of echocardiogram    Echo 10/18: Vigorous LVF, EF 65-70, normal wall motion, grade 1 diastolic dysfunction, GLS -21.1%, mild RAE  . History of nuclear stress test    Nuclear stress test 10/18: EF 69, inf-lat defect c/w poss infarct with peri-infarct ischemia; Intermediate Risk  . Hyperlipidemia   . Hypothyroidism   . Left hemiparesis ([place])   . LVH (left ventricular hypertrophy)   . Mitral regurgitation    a. 12/2015 Echo: EF 60-65%, mild focal basal hypertrophy. No rwma, triv AI, mild MR, mildly dil LA w/o thrombus. No RA thrombus. No PFO.  Marland Kitchen Palpitations   . Prediabetes   . Stroke-like symptom 12/13/2015  . SVT (supraventricular tachycardia) ([place]) 12/14/2015  . Vascular parkinsonism ([place]) 06/29/2016  .  Vitamin D deficiency     Past Surgical History:  Procedure Laterality Date  . CARDIOVASCULAR STRESS TEST  2002   NORMAL  . EP IMPLANTABLE DEVICE N/A 12/17/2015   Procedure: Loop Recorder Insertion;  Surgeon: [name], MD;  Location: [facility];  Service: Cardiovascular;  Laterality: N/A;  . LEFT HEART CATH AND CORONARY ANGIOGRAPHY N/A 05/25/2017   Procedure: LEFT HEART CATH AND CORONARY ANGIOGRAPHY;  Surgeon: Sherren Mocha, MD;  Location: [facility];  Service: Cardiovascular;  Laterality: N/A;  . LOOP RECORDER IMPLANT    . TEE WITHOUT CARDIOVERSION N/A 12/17/2015   Procedure: TRANSESOPHAGEAL ECHOCARDIOGRAM (TEE);  Surgeon: Lelon Perla, MD;  Location[facility] ENDOSCOPY;  Service: Cardiovascular;  Laterality: N/A;  Pt also needs a LOOP  . TRANSTHORACIC ECHOCARDIOGRAM  2008   SHOWED LEFT VENTRICULAR HYPERTROPHY AND MILD AORTIC STENOSIS    There were no vitals filed for this visit.  Subjective Assessment - 12/23/17 1444    Subjective  had DNA testing done this morning plus checking cholesterol    Currently in Pain?  No/denies                       [facility] Adult PT Treatment/Exercise - 12/23/17 0001      Ambulation/Gait   Gait Comments  reciprocal gait with sticks tio increase speed gait training with SPC with increased cadeance  and balance      Knee/Hip Exercises: Aerobic   Nustep  L5 x 7 min       Knee/Hip Exercises: Machines for Strengthening   Cybex Knee Extension  10lbs 2x10 LLE 5lb 1x10    Cybex Knee Flexion  20# 2 sets 10; 10lb LLE 1x10     Cybex Leg Press  20# 3 sets 10      Knee/Hip Exercises: Seated   Sit to Sand  1 set;10 reps;without UE support      Knee/Hip Exercises: Supine   Quad Sets  -- on airex               PT Short Term Goals - 09/02/17 1435      PT SHORT TERM GOAL #1   Title  independent with her home HEP    Time  2    Period  Weeks    Status  On-going        PT Long Term Goals - 12/23/17 1558      PT  LONG TERM GOAL #1   Title  decrease TUG time to 14 seconds for functional gait and safety    Status  Achieved      PT LONG TERM GOAL #2   Title  increase Berg balance score to 46/56 for safety    Baseline  46/56    Status  Achieved      PT LONG TERM GOAL #3   Title  increase LE strength (particularly ABD and ext.) to 4/5 for functional gait and safety    Status  Achieved      PT LONG TERM GOAL #4   Title  be able to do a sit to stand without assistance and without losing balance for functional use    Status  Achieved            Plan - 12/23/17 1559    Clinical Impression Statement  all goals met. pt still with deficiets with decreased ADLs,gait and balance rec D/C an dcontinue with independant gym exercise    PT Treatment/Interventions  Stair training;Gait training;Functional mobility training;Therapeutic activities;Therapeutic exercise;Balance training;Patient/family education    PT Next Visit Plan  D/C       Patient will benefit from skilled therapeutic intervention in order to improve the following deficits and impairments:  Abnormal gait, Decreased strength, Decreased balance, Difficulty walking, Decreased activity tolerance, Decreased coordination, Decreased mobility  Visit Diagnosis: Muscle weakness (generalized)  Difficulty in walking, not elsewhere classified  Balance problem     Problem List Patient Active Problem List   Diagnosis Date Noted  . Psychogenic gait 10/21/2017  . Coronary artery disease involving native coronary artery of native heart without angina pectoris 06/09/2017  . Abnormal nuclear cardiac imaging test 05/25/2017  . PVC's (premature ventricular contractions) 05/17/2017  . Anxiety 12/29/2016  . Vascular parkinsonism ([place]) 06/29/2016  . Gait disorder 04/07/2016  . Intracranial carotid stenosis, bilateral 02/27/2016  . Essential hypertension   . Left-sided weakness 12/28/2015  . Headache 12/28/2015  . Adjustment reaction with anxiety  12/20/2015  . Left hemiparesis ([place])   . History of CVA with residual deficit   . Prediabetes   . SVT (supraventricular tachycardia) ([place]) 12/14/2015  . Stroke-like symptom 12/13/2015  . History of stroke   . Heart palpitations 09/06/2012  . Hypertension   . Familial hyperlipidemia   . Hypothyroidism   . Vitamin D deficiency   . Palpitations   . History of dizziness   . LVH (left  ventricular hypertrophy)   . Aortic stenosis   . Mitral regurgitation   . SOB (shortness of breath)   . Difficulty walking   PHYSICAL THERAPY DISCHARGE SUMMARY   Plan: Patient agrees to discharge.  Patient goals were met. Patient is being discharged due to meeting the stated rehab goals.  ?????       Latrelle Fuston,ANGIE PTA 12/23/2017, 4:00 PM  [place] [address] [place], [place], [address] Phone: [phone]   Fax:  [phone]  Name: Stacey Ramirez MRN: [id number] Date of Birth: [birth year]

## 2017-12-24 ENCOUNTER — Other Ambulatory Visit: Payer: Self-pay | Admitting: Internal Medicine

## 2017-12-24 DIAGNOSIS — E7849 Other hyperlipidemia: Secondary | ICD-10-CM

## 2017-12-28 ENCOUNTER — Other Ambulatory Visit: Payer: Self-pay | Admitting: Internal Medicine

## 2017-12-28 DIAGNOSIS — E7801 Familial hypercholesterolemia: Secondary | ICD-10-CM

## 2017-12-28 NOTE — Progress Notes (Signed)
[place] lab orders

## 2017-12-29 NOTE — Progress Notes (Signed)
Pre-test GC notes  Stacey Ramirez was referred for genetic consult of familial hypercholesterolemia (FH). We walked through the risk factors that can lead to hypercholesterolemia and discussed the characteristic features of a genetic condition, namely absence of risk factors, early age of presentation, increased disease severity and family history of the condition. The clinical manifestations of FH were reviewed. 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 She states that she has had this for at least 10 years.  We went through the molecular pathogenesis of FH and I informed her that St.  is primarily caused by pathogenic variants in three genes, namely APOB, LDLR and PCSK9. These pathogenic variants impact LDLR synthesis, degradation and recycling in cells. We then walked through autosomal dominant inheritance pattern and viewed pedigree of families with heterozygous FH (HeFH) and homozygous FH (HoFH). I informed her that digenic or compound heterozygous mutations in APOB, LDLR and PCSK9 genes can cause HoFH.   We reviewed the likely outcomes of FH genetic testing. Based on the diagnostic criteria for FH, yields can range from 50%-90%. A positive yield is observed in  ~63% of patients with a definite clinical diagnosis of FH. I also made clear to her that a negative test does not exclude a genetic basis for FH. Limitations in current genetic testing methodology can produce a negative result. Variants of unknown significance (VUS) can be seen in some cases. I explained to her that typically a VUS is so classified if the variant is not well understood as very few individuals have been reported to harbor this variant or its role in gene function has not been elucidated. Screening other first-degree family members by genetic testing was also discussed. Additionally, we briefly touched upon the molecular basis of the different treatment modalities  that are currently available.  Her medical and 4-generation family history was obtained. See details below-  [place] (III.1 on pedigree) is a very sweet 77 year old Caucasian lady who is here today with her husband, Stacey Ramirez. She informs me that she was found to have hyperlipidemia around age 40 when she went in for her annual checkup. She was found to have elevated LDL-C and was placed on medication.   She tells me that she had a stroke about two years ago. She underwent rehab and felt better in about six months. About two months ago, she states that her left leg became immobilized and was taken to the ER where it was thought to be a TIA.  She states that she had made remarkable progress after her first stroke and was able to walk around her house by herself. She feels quite sad about the fact the gains she had made over the past few months were erased by her latest event, but is willing to make the effort to get better.   Currently, she still has trouble walking independently. Her husband is her primary caretaker. She has been non-responsive to statin and PCSK9 inhibitor therapy.  Family history Stacey Ramirez (III.1) is the only child of her parents. She has two adopted children; a 17 year old son (IV.45) and 71 year old (IV.2) daughter.   There is no history of atherosclerotic vascular disease in her paternal lineage.  Her father (II.2) did not survive a mitral valve repair surgery and his brother (II.1) died of lung cancer. She does not know much about her paternal grandparents.   Stacey Ramirez's mom (II.3) was diagnosed with hyperlipidemia at age  89. She had a heart attack and had stents placed in her 44s. Stacey Ramirez states that her mother's LDL-C was more than 300 mg/dL. She states that her mother lived with her. Her mother died suddenly at age 69; she found her mother dead on the floor one day.   Most of her mother's siblings (II.4-II.7) had elevated LDL-C. She, however, does not know their  exact LDL-C values. Important to her condition is the death of her maternal uncle (II.6) from a heart attack at age 58. He had two girls (III.4, III.5); Stacey Ramirez is not aware of their medical history.  Stacey Ramirez's maternal grandfather (I.3) had several heart attacks and ultimately succumbed to one in his 69. She suspects that he passed down his condition to her mother and herself.   Impression and Plans  In summary, Stacey Ramirez's clinical presentation, early age of diagnosis in the absence of known risk factors and significant family history of hyperlipidemia and sudden death in her maternal lineage is indicative of a genetic condition. It is highly likely that she has inherited her condition from her mother. Genetic testing for FH is highly recommended. The genetic test should include APOB, LDLR and PCSK9 genes. Identifying the causative gene variant will help in directing appropriate treatment strategies to reduce her cholesterol levels and risk of an adverse event.  We also reviewed insurance coverage for genetic testing. I informed her that Medicare will not cover genetic testing for this condition. She is willing to pay for the test. I informed her that the lab will extend her an interest free payment plan so as to reduce her financial burden.   Plan Stacey Ramirez is interested in genetic testing for FH. Blood was drawn today and sent out for testing.                                                                                                                                                                                                                                                            Lattie Corns, Ph.D, [facility] Clinical Molecular Geneticist

## 2018-01-07 LAB — CUP PACEART REMOTE DEVICE CHECK
MDC IDC PG IMPLANT DT: 20170516
MDC IDC SESS DTM: 20190516124132

## 2018-01-10 ENCOUNTER — Telehealth: Payer: Self-pay | Admitting: Neurology

## 2018-01-10 NOTE — Telephone Encounter (Signed)
Patient was seen by Dr. Saunders Revel on 11/03/17 for these neurological concerns. Please advise husband to contact Dr. Meryle Ready office for these concerns. Per Dr. Meryle Ready notes, it appears as though patient has these continued complaints of decrease mobility after stopping PT.  Per Dr. Meryle Ready note:  Plan: Continue PT and then start participating in a community exercise program and falls prevention program. Continue all medications for blood pressure and healthy diet for diabetes prevention Be as active as possible. Continue sertraline for depression and anxiety Continue Plavix for stroke prevention Return for follow-up in 6 months.  Please have patients husband follow up with [facility] neurology. Thank you.

## 2018-01-10 NOTE — Telephone Encounter (Signed)
Pt's husband called said she's had a decrease in ability to walk over the past month. Husband said she did well while taking PT, she finished up with that about a month ago. He said he called [facility] last month but she could not be seen until the end of July and he thought that was too long to wait for an appt. He talked about the patient having PT again, phone rep advised him to call PCP as they would be able to order that. He is wanting her seen. Please call to advise

## 2018-01-10 NOTE — Telephone Encounter (Signed)
Revised. 

## 2018-01-10 NOTE — Telephone Encounter (Addendum)
Rn call patients husband back concerning pt need to be seen sooner by Stacey Billow NP because she is deteriorating. The husband stated wife had therapy last month, but may need more. She is using her walker more often than usual. Rn stated per Stacey Ramirez stroke NP he needs to call Dr. Saunders Revel office at [facility]. Rn stated pt was seen there in April 2019 as a new consult, and PCP referred her there. Rn stated pt has seen Dr.Athar, and Dr. Erlinda Hong for the same issue,and pt is still having difficulty walking, and being evaluated for Parkinson disease which was nonspecific.Rn stated it was recommend by all the 3 neurologist  that she continue therapy,and do  therapy,and exercises at the house or local community program that offers services for seniors her age. Rn stated even Dr. Saunders Revel recommend pt use walker, do exercises at home,and community based programs. Rn stated per the notes pt seems to have complaints when she stops therapy with staff.Rn stated it was also recommend she continue her Zoloft for depression,and anxiety.  Rn ask husband if his wife is doing any community exercises like zumba, water aerobics, or any activity for her legs. The husband stated his wife is not doing the exercises at home. Rn stated per Stacey Billow NP note it was advised pts husband to call Dr. Saunders Revel office, and get an appt sooner with her. Rn recommend he ask for a PA or NP appt who works with Dr. Saunders Revel if she was not available. Rn stated Stacey Billow NP notified that appt with her can be cancel on 04/2018 because she is seeing D[facility] office.The husband verbalized understanding.

## 2018-01-13 ENCOUNTER — Encounter: Payer: Self-pay | Admitting: Physical Therapy

## 2018-01-13 ENCOUNTER — Ambulatory Visit: Payer: PPO | Attending: Internal Medicine | Admitting: Physical Therapy

## 2018-01-13 DIAGNOSIS — F419 Anxiety disorder, unspecified: Secondary | ICD-10-CM | POA: Diagnosis not present

## 2018-01-13 DIAGNOSIS — R262 Difficulty in walking, not elsewhere classified: Secondary | ICD-10-CM | POA: Diagnosis not present

## 2018-01-13 DIAGNOSIS — G8194 Hemiplegia, unspecified affecting left nondominant side: Secondary | ICD-10-CM | POA: Insufficient documentation

## 2018-01-13 DIAGNOSIS — M6281 Muscle weakness (generalized): Secondary | ICD-10-CM

## 2018-01-13 DIAGNOSIS — R2689 Other abnormalities of gait and mobility: Secondary | ICD-10-CM | POA: Insufficient documentation

## 2018-01-13 DIAGNOSIS — I63411 Cerebral infarction due to embolism of right middle cerebral artery: Secondary | ICD-10-CM | POA: Diagnosis not present

## 2018-01-13 NOTE — Therapy (Signed)
[address] [place], [place], [address] Phone: [phone]   Fax:  [phone]  Physical Therapy Evaluation  Patient Details  Name: Stacey Ramirez MRN: [id number] Date of Birth: 1940/08/22 Referring Provider: Shelia Media   Encounter Date: 01/13/2018  PT End of Session - 01/13/18 1204    Visit Number  1    Date for PT Re-Evaluation  02/12/18    PT Start Time  1013    PT Stop Time  1050    PT Time Calculation (min)  37 min    Activity Tolerance  Patient tolerated treatment well    Behavior During Therapy  Anxious       Past Medical History:  Diagnosis Date  . Adjustment reaction with anxiety 12/20/2015  . Aortic stenosis   . CAD (coronary artery disease) 06/09/2017   Nuc study 10/18: EF 69, inferolateral perfusion defect-possible small infarct with peri-infarct ischemia; intermediate risk // LHC 10/18: pLAD 25, pLCx 50, pRCA 25 >> med Rx  . Carotid stenosis, asymptomatic, bilateral 02/27/2016   Carotid US 5/17: Bilateral ICA 1-39  . Cerebrovascular accident (stroke) ([place])    a. 12/2015 - cryptogenic.  S/P MDT Linq.  . Difficulty walking   . Essential hypertension   . Gait disorder 04/07/2016  . Heart palpitations 09/06/2012  . History of CVA with residual deficit 12/14/2015  . History of dizziness   . History of echocardiogram    Echo 10/18: Vigorous LVF, EF 65-70, normal wall motion, grade 1 diastolic dysfunction, GLS -21.1%, mild RAE  . History of nuclear stress test    Nuclear stress test 10/18: EF 69, inf-lat defect c/w poss infarct with peri-infarct ischemia; Intermediate Risk  . Hyperlipidemia   . Hypothyroidism   . Left hemiparesis ([place])   . LVH (left ventricular hypertrophy)   . Mitral regurgitation    a. 12/2015 Echo: EF 60-65%, mild focal basal hypertrophy. No rwma, triv AI, mild MR, mildly dil LA w/o thrombus. No RA thrombus. No PFO.  Marland Kitchen Palpitations   . Prediabetes   . Stroke-like symptom 12/13/2015   . SVT (supraventricular tachycardia) ([place]) 12/14/2015  . Vascular parkinsonism ([place]) 06/29/2016  . Vitamin D deficiency     Past Surgical History:  Procedure Laterality Date  . CARDIOVASCULAR STRESS TEST  2002   NORMAL  . EP IMPLANTABLE DEVICE N/A 12/17/2015   Procedure: Loop Recorder Insertion;  Surgeon: [name], MD;  Location: [facility];  Service: Cardiovascular;  Laterality: N/A;  . LEFT HEART CATH AND CORONARY ANGIOGRAPHY N/A 05/25/2017   Procedure: LEFT HEART CATH AND CORONARY ANGIOGRAPHY;  Surgeon: Sherren Mocha, MD;  Location: [facility];  Service: Cardiovascular;  Laterality: N/A;  . LOOP RECORDER IMPLANT    . TEE WITHOUT CARDIOVERSION N/A 12/17/2015   Procedure: TRANSESOPHAGEAL ECHOCARDIOGRAM (TEE);  Surgeon: Lelon Perla, MD;  Location: [facility];  Service: Cardiovascular;  Laterality: N/A;  Pt also needs a LOOP  . TRANSTHORACIC ECHOCARDIOGRAM  2008   SHOWED LEFT VENTRICULAR HYPERTROPHY AND MILD AORTIC STENOSIS    There were no vitals filed for this visit.   Subjective Assessment - 01/13/18 1019    Subjective  Patient was seen here during the first of the year, she was having balance issues, she got much better with her Berg balance score and with her walking.  She was discharged 12/23/17.  She was to do exercises on her own, she reports that the week we discharged her she  was doing great, reports that she walked the bewst she had been but went on a trips and had people at her house and really has regressed.    Patient Stated Goals  being able to walk without being fearful and without having to use someone or an assistive device for stability    Currently in Pain?  Yes    Pain Score  4     Pain Location  Back    Pain Orientation  Lower;Left;Right    Pain Descriptors / Indicators  Aching;Sore    Pain Type  Acute pain    Pain Onset  In the past 7 days    Pain Frequency  Intermittent    Aggravating Factors   reports that she feels that she is  struggling to walk and really thinks that has caused this pain   pain up to 6/10 with walking    Pain Relieving Factors  not walking    Effect of Pain on Daily Activities  fear of falling, difficulty walking or moving         [facility] PT Assessment - 01/13/18 0001      Assessment   Medical Diagnosis  difficulty walking, poor balance , LBP    Referring Provider  [place]    Onset Date/Surgical Date  01/06/18    Prior Therapy  last month      Balance Screen   Has the patient fallen in the past 6 months  Yes    How many times?  1    Has the patient had a decrease in activity level because of a fear of falling?   Yes    Is the patient reluctant to leave their home because of a fear of falling?   Yes      Home Environment   Additional Comments  uses 4WW around house, about 16 steps in the house      Prior Function   Level of Independence  Independent;Independent with household mobility with device    Vocation  Retired    Leisure  going to house at [employer], readying, painting, exercise: standing hip ABD/ext. and squats      Strength   Right Hip Flexion  3+/5    Right Hip Extension  3/5    Right Hip ABduction  3+/5    Left Hip Flexion  3+/5    Left Hip Extension  3+/5    Right Knee Flexion  4-/5    Right Knee Extension  4-/5    Left Knee Flexion  4-/5    Left Knee Extension  4-/5      Transfers   Comments  has to use her hands to get up and a few tries to get up to standing from sitting      Ambulation/Gait   Gait Comments  patient walks with a 4WW and a FWW at home, she walks in to the building holding her husbands arm, she tends to really have difficulty walking, she will be distracted and starts to really shuffle, almost a festinating gait pattern.  She had difficulty turning, tends to fall toward the back.        Berg Balance Test   Sit to Stand  Able to stand using hands after several tries    Standing Unsupported  Able to stand 2 minutes with supervision    Sitting with  Back Unsupported but Feet Supported on Floor or Stool  Able to sit 2 minutes under supervision    Stand  to Sit  Uses backs of legs against chair to control descent    Transfers  Able to transfer safely, definite need of hands    Standing Unsupported with Eyes Closed  Able to stand 10 seconds with supervision    Standing Ubsupported with Feet Together  Able to place feet together independently but unable to hold for 30 seconds    From Standing, Reach Forward with Outstretched Arm  Can reach forward >12 cm safely (5")    From Standing Position, Pick up Object from Floor  Able to pick up shoe, needs supervision    From Standing Position, Turn to Look Behind Over each Shoulder  Turn sideways only but maintains balance    Turn 360 Degrees  Able to turn 360 degrees safely but slowly    Standing Unsupported, Alternately Place Feet on Step/Stool  Able to stand independently and complete 8 steps >20 seconds    Standing Unsupported, One Foot in [employer] balance while stepping or standing    Standing on One Leg  Unable to try or needs assist to prevent fall    Total Score  31      Timed Up and Go Test   Normal TUG (seconds)  43                Objective measurements completed on examination: See above findings.                PT Short Term Goals - 01/13/18 1208      PT SHORT TERM GOAL #1   Title  independent with her home HEP    Time  2    Period  Weeks    Status  New        PT Long Term Goals - 01/13/18 1258      PT LONG TERM GOAL #1   Title  decrease TUG time to 14 seconds for functional gait and safety    Time  8    Period  Weeks    Status  New      PT LONG TERM GOAL #2   Title  increase Berg balance score to 46/56 for safety    Time  8    Period  Weeks    Status  New      PT LONG TERM GOAL #3   Title  increase LE strength (particularly ABD and ext.) to 4/5 for functional gait and safety    Time  8    Period  Weeks    Status  New      PT LONG TERM  GOAL #4   Title  be able to do a sit to stand without assistance and without losing balance for functional use    Time  8    Period  Weeks    Status  New             Plan - 01/13/18 1204    Clinical Impression Statement  Patient was seen here until 12/23/17, she did great decreased her TUG time, increased her Berg balance score, she was doing well walking without holding on and reported feeling confident.  She reports that she really regressed over the past two weeks, her Merrilee Jansky score is now 10 points lower, her TUG time is more than double what it was, she has weaker LE's, the husband reports that about 2 weeks ago she did c/o some increased left LE weakness.  She tends to shuffle and when distracted or  turning she really almost festinates with her feet, at times she had difficulty putting weight ont he left LE today, she reported it felt weak.  She seems very anxious and is frustrated with her condition, her and her husband report that they were doing the exercises that we had given they deny doing any walking.    Clinical Presentation  Stable    Clinical Decision Making  Low    Rehab Potential  Fair    PT Frequency  2x / week    PT Duration  8 weeks    PT Treatment/Interventions  Stair training;Gait training;Functional mobility training;Therapeutic activities;Therapeutic exercise;Balance training;Patient/family education    PT Next Visit Plan  this is a re-eval and we will restart the PT secondary to a decrease in function    Consulted and Agree with Plan of Care  Patient       Patient will benefit from skilled therapeutic intervention in order to improve the following deficits and impairments:  Abnormal gait, Decreased strength, Decreased balance, Difficulty walking, Decreased activity tolerance, Decreased coordination, Decreased mobility  Visit Diagnosis: Muscle weakness (generalized) - Plan: PT plan of care cert/re-cert  Difficulty in walking, not elsewhere classified - Plan: PT  plan of care cert/re-cert  Balance problem - Plan: PT plan of care cert/re-cert  Anxiety - Plan: PT plan of care cert/re-cert  Left hemiparesis ([place]) - Plan: PT plan of care cert/re-cert     Problem List Patient Active Problem List   Diagnosis Date Noted  . Psychogenic gait 10/21/2017  . Coronary artery disease involving native coronary artery of native heart without angina pectoris 06/09/2017  . Abnormal nuclear cardiac imaging test 05/25/2017  . PVC's (premature ventricular contractions) 05/17/2017  . Anxiety 12/29/2016  . Vascular parkinsonism ([place]) 06/29/2016  . Gait disorder 04/07/2016  . Intracranial carotid stenosis, bilateral 02/27/2016  . Essential hypertension   . Left-sided weakness 12/28/2015  . Headache 12/28/2015  . Adjustment reaction with anxiety 12/20/2015  . Left hemiparesis ([place])   . History of CVA with residual deficit   . Prediabetes   . SVT (supraventricular tachycardia) ([employer]) 12/14/2015  . Stroke-like symptom 12/13/2015  . History of stroke   . Heart palpitations 09/06/2012  . Hypertension   . Familial hyperlipidemia   . Hypothyroidism   . Vitamin D deficiency   . Palpitations   . History of dizziness   . LVH (left ventricular hypertrophy)   . Aortic stenosis   . Mitral regurgitation   . SOB (shortness of breath)   . Difficulty walking     Sumner Boast., PT 01/13/2018, 1:01 PM  [address] [place], [place], [address] Phone: [phone]   Fax:  [phone]  Name: Stacey Ramirez MRN: [id number] Date of Birth: 1940-08-08

## 2018-01-18 ENCOUNTER — Ambulatory Visit (INDEPENDENT_AMBULATORY_CARE_PROVIDER_SITE_OTHER): Payer: PPO | Admitting: *Deleted

## 2018-01-18 ENCOUNTER — Ambulatory Visit: Payer: PPO | Admitting: Physical Therapy

## 2018-01-18 DIAGNOSIS — M6281 Muscle weakness (generalized): Secondary | ICD-10-CM

## 2018-01-18 DIAGNOSIS — I639 Cerebral infarction, unspecified: Secondary | ICD-10-CM

## 2018-01-18 DIAGNOSIS — R2689 Other abnormalities of gait and mobility: Secondary | ICD-10-CM

## 2018-01-18 DIAGNOSIS — R262 Difficulty in walking, not elsewhere classified: Secondary | ICD-10-CM

## 2018-01-18 NOTE — Therapy (Signed)
[place] [place], [place], [address] Phone: [phone]   Fax:  [phone]  Physical Therapy Treatment  Patient Details  Name: Stacey Ramirez MRN: [id number] Date of Birth: [birth year] Referring Provider: Shelia Media   Encounter Date: 01/18/2018  PT End of Session - 01/18/18 1107    Visit Number  2    Date for PT Re-Evaluation  02/12/18    PT Start Time  1106    PT Stop Time  1145    PT Time Calculation (min)  39 min    Activity Tolerance  Patient tolerated treatment well    Behavior During Therapy  Anxious       Past Medical History:  Diagnosis Date  . Adjustment reaction with anxiety 12/20/2015  . Aortic stenosis   . CAD (coronary artery disease) 06/09/2017   Nuc study 10/18: EF 69, inferolateral perfusion defect-possible small infarct with peri-infarct ischemia; intermediate risk // LHC 10/18: pLAD 25, pLCx 50, pRCA 25 >> med Rx  . Carotid stenosis, asymptomatic, bilateral 02/27/2016   Carotid US 5/17: Bilateral ICA 1-39  . Cerebrovascular accident (stroke) ([place])    a. 12/2015 - cryptogenic.  S/P MDT Linq.  . Difficulty walking   . Essential hypertension   . Gait disorder 04/07/2016  . Heart palpitations 09/06/2012  . History of CVA with residual deficit 12/14/2015  . History of dizziness   . History of echocardiogram    Echo 10/18: Vigorous LVF, EF 65-70, normal wall motion, grade 1 diastolic dysfunction, GLS -21.1%, mild RAE  . History of nuclear stress test    Nuclear stress test 10/18: EF 69, inf-lat defect c/w poss infarct with peri-infarct ischemia; Intermediate Risk  . Hyperlipidemia   . Hypothyroidism   . Left hemiparesis ([place])   . LVH (left ventricular hypertrophy)   . Mitral regurgitation    a. 12/2015 Echo: EF 60-65%, mild focal basal hypertrophy. No rwma, triv AI, mild MR, mildly dil LA w/o thrombus. No RA thrombus. No PFO.  Marland Kitchen Palpitations   . Prediabetes   . Stroke-like symptom 12/13/2015   . SVT (supraventricular tachycardia) ([place]) 12/14/2015  . Vascular parkinsonism ([place]) 06/29/2016  . Vitamin D deficiency     Past Surgical History:  Procedure Laterality Date  . CARDIOVASCULAR STRESS TEST  2002   NORMAL  . EP IMPLANTABLE DEVICE N/A 12/17/2015   Procedure: Loop Recorder Insertion;  Surgeon: [name], MD;  Location: [facility];  Service: Cardiovascular;  Laterality: N/A;  . LEFT HEART CATH AND CORONARY ANGIOGRAPHY N/A 05/25/2017   Procedure: LEFT HEART CATH AND CORONARY ANGIOGRAPHY;  Surgeon: Sherren Mocha, MD;  Location: [facility];  Service: Cardiovascular;  Laterality: N/A;  . LOOP RECORDER IMPLANT    . TEE WITHOUT CARDIOVERSION N/A 12/17/2015   Procedure: TRANSESOPHAGEAL ECHOCARDIOGRAM (TEE);  Surgeon: Lelon Perla, MD;  Location: [facility] ENDOSCOPY;  Service: Cardiovascular;  Laterality: N/A;  Pt also needs a LOOP  . TRANSTHORACIC ECHOCARDIOGRAM  2008   SHOWED LEFT VENTRICULAR HYPERTROPHY AND MILD AORTIC STENOSIS    There were no vitals filed for this visit.  Subjective Assessment - 01/18/18 1154    Subjective  Patient reports she isn't moving very well today. She states she needs her walker, but didn't bring it because her husband walked her in.    Patient Stated Goals  being able to walk without being fearful and without having to use someone or an assistive device for stability  Currently in Pain?  No/denies                       [facility] Adult PT Treatment/Exercise - 01/18/18 0001      Ambulation/Gait   Gait Comments  backward walking, side stepping and fast forward walking with one hand assist      Knee/Hip Exercises: Aerobic   Nustep  L5 x 8 min       Knee/Hip Exercises: Machines for Strengthening   Cybex Knee Extension  10lbs 2x10 LLE 5lb 1x10    Cybex Knee Flexion  20# 2 sets 10; 10lb LLE 1x10     Cybex Leg Press  20# 3 sets 10      Knee/Hip Exercises: Standing   Forward Step Up  Both;1 set;5 reps;Hand Hold: 1;Step  Height: 6"    Other Standing Knee Exercises  toe taps to 6 inch step one hand assist x 10 ea      Knee/Hip Exercises: Seated   Sit to Sand  10 reps;2 sets               PT Short Term Goals - 01/13/18 1208      PT SHORT TERM GOAL #1   Title  independent with her home HEP    Time  2    Period  Weeks    Status  New        PT Long Term Goals - 01/13/18 1258      PT LONG TERM GOAL #1   Title  decrease TUG time to 14 seconds for functional gait and safety    Time  8    Period  Weeks    Status  New      PT LONG TERM GOAL #2   Title  increase Berg balance score to 46/56 for safety    Time  8    Period  Weeks    Status  New      PT LONG TERM GOAL #3   Title  increase LE strength (particularly ABD and ext.) to 4/5 for functional gait and safety    Time  8    Period  Weeks    Status  New      PT LONG TERM GOAL #4   Title  be able to do a sit to stand without assistance and without losing balance for functional use    Time  8    Period  Weeks    Status  New            Plan - 01/18/18 1152    Clinical Impression Statement  Patient did well with TE today. C/o being weak, but completed all TE without rest.       Patient will benefit from skilled therapeutic intervention in order to improve the following deficits and impairments:     Visit Diagnosis: Muscle weakness (generalized)  Difficulty in walking, not elsewhere classified  Balance problem     Problem List Patient Active Problem List   Diagnosis Date Noted  . Psychogenic gait 10/21/2017  . Coronary artery disease involving native coronary artery of native heart without angina pectoris 06/09/2017  . Abnormal nuclear cardiac imaging test 05/25/2017  . PVC's (premature ventricular contractions) 05/17/2017  . Anxiety 12/29/2016  . Vascular parkinsonism ([place]) 06/29/2016  . Gait disorder 04/07/2016  . Intracranial carotid stenosis, bilateral 02/27/2016  . Essential hypertension   . Left-sided  weakness 12/28/2015  . Headache 12/28/2015  . Adjustment reaction  with anxiety 12/20/2015  . Left hemiparesis ([place])   . History of CVA with residual deficit   . Prediabetes   . SVT (supraventricular tachycardia) ([place]) 12/14/2015  . Stroke-like symptom 12/13/2015  . History of stroke   . Heart palpitations 09/06/2012  . Hypertension   . Familial hyperlipidemia   . Hypothyroidism   . Vitamin D deficiency   . Palpitations   . History of dizziness   . LVH (left ventricular hypertrophy)   . Aortic stenosis   . Mitral regurgitation   . SOB (shortness of breath)   . Difficulty walking     Raeli Wiens PT 01/18/2018, 11:55 AM  [address] [place], [place], [address] Phone: [phone]   Fax:  [phone]  Name: Stacey Ramirez MRN: [id number] Date of Birth: 1940/10/04

## 2018-01-18 NOTE — Progress Notes (Signed)
Carelink Summary Report / Loop Recorder 

## 2018-01-19 DIAGNOSIS — N3941 Urge incontinence: Secondary | ICD-10-CM | POA: Diagnosis not present

## 2018-01-19 DIAGNOSIS — Z87448 Personal history of other diseases of urinary system: Secondary | ICD-10-CM | POA: Diagnosis not present

## 2018-01-20 ENCOUNTER — Encounter: Payer: Self-pay | Admitting: Physical Therapy

## 2018-01-20 ENCOUNTER — Ambulatory Visit: Payer: PPO | Admitting: Physical Therapy

## 2018-01-20 DIAGNOSIS — R2689 Other abnormalities of gait and mobility: Secondary | ICD-10-CM

## 2018-01-20 DIAGNOSIS — R262 Difficulty in walking, not elsewhere classified: Secondary | ICD-10-CM

## 2018-01-20 DIAGNOSIS — M6281 Muscle weakness (generalized): Secondary | ICD-10-CM

## 2018-01-20 NOTE — Therapy (Signed)
[facility] [place], [place], [address] Phone: [phone]   Fax:  [phone]  Physical Therapy Treatment  Patient Details  Name: Stacey Ramirez MRN: [id number] Date of Birth: Jan 06, 1941 Referring Provider: Shelia Media   Encounter Date: 01/20/2018  PT End of Session - 01/20/18 1516    Visit Number  3    Date for PT Re-Evaluation  02/12/18    PT Start Time  6962    PT Stop Time  9528    PT Time Calculation (min)  45 min       Past Medical History:  Diagnosis Date  . Adjustment reaction with anxiety 12/20/2015  . Aortic stenosis   . CAD (coronary artery disease) 06/09/2017   Nuc study 10/18: EF 69, inferolateral perfusion defect-possible small infarct with peri-infarct ischemia; intermediate risk // LHC 10/18: pLAD 25, pLCx 50, pRCA 25 >> med Rx  . Carotid stenosis, asymptomatic, bilateral 02/27/2016   Carotid US 5/17: Bilateral ICA 1-39  . Cerebrovascular accident (stroke) ([place])    a. 12/2015 - cryptogenic.  S/P MDT Linq.  . Difficulty walking   . Essential hypertension   . Gait disorder 04/07/2016  . Heart palpitations 09/06/2012  . History of CVA with residual deficit 12/14/2015  . History of dizziness   . History of echocardiogram    Echo 10/18: Vigorous LVF, EF 65-70, normal wall motion, grade 1 diastolic dysfunction, GLS -21.1%, mild RAE  . History of nuclear stress test    Nuclear stress test 10/18: EF 69, inf-lat defect c/w poss infarct with peri-infarct ischemia; Intermediate Risk  . Hyperlipidemia   . Hypothyroidism   . Left hemiparesis ([place])   . LVH (left ventricular hypertrophy)   . Mitral regurgitation    a. 12/2015 Echo: EF 60-65%, mild focal basal hypertrophy. No rwma, triv AI, mild MR, mildly dil LA w/o thrombus. No RA thrombus. No PFO.  Marland Kitchen Palpitations   . Prediabetes   . Stroke-like symptom 12/13/2015  . SVT (supraventricular tachycardia) ([place]) 12/14/2015  . Vascular parkinsonism ([place])  06/29/2016  . Vitamin D deficiency     Past Surgical History:  Procedure Laterality Date  . CARDIOVASCULAR STRESS TEST  2002   NORMAL  . EP IMPLANTABLE DEVICE N/A 12/17/2015   Procedure: Loop Recorder Insertion;  Surgeon: [name], MD;  Location: [place] [facility];  Service: Cardiovascular;  Laterality: N/A;  . LEFT HEART CATH AND CORONARY ANGIOGRAPHY N/A 05/25/2017   Procedure: LEFT HEART CATH AND CORONARY ANGIOGRAPHY;  Surgeon: Sherren Mocha, MD;  Location: [facility];  Service: Cardiovascular;  Laterality: N/A;  . LOOP RECORDER IMPLANT    . TEE WITHOUT CARDIOVERSION N/A 12/17/2015   Procedure: TRANSESOPHAGEAL ECHOCARDIOGRAM (TEE);  Surgeon: Lelon Perla, MD;  Location: [facility] ENDOSCOPY;  Service: Cardiovascular;  Laterality: N/A;  Pt also needs a LOOP  . TRANSTHORACIC ECHOCARDIOGRAM  2008   SHOWED LEFT VENTRICULAR HYPERTROPHY AND MILD AORTIC STENOSIS    There were no vitals filed for this visit.  Subjective Assessment - 01/20/18 1451    Subjective  tired and slow moving today- almost thought about canceling    Currently in Pain?  No/denies                       [facility] Adult PT Treatment/Exercise - 01/20/18 0001      High Level Balance   High Level Balance Activities  Negotitating around obstacles;Negotiating over obstacles ball toss on airex  Knee/Hip Exercises: Aerobic   Recumbent Bike  6 min    Nustep  L5 x 8 min       Knee/Hip Exercises: Machines for Strengthening   Cybex Knee Extension  10lbs 2x10 LLE 5lb 1x10    Cybex Knee Flexion  20# 2 sets 10; 10lb LLE 1x10       Knee/Hip Exercises: Standing   Other Standing Knee Exercises  HHA on airex- LE strength and balance               PT Short Term Goals - 01/13/18 1208      PT SHORT TERM GOAL #1   Title  independent with her home HEP    Time  2    Period  Weeks    Status  New        PT Long Term Goals - 01/13/18 1258      PT LONG TERM GOAL #1   Title  decrease TUG  time to 14 seconds for functional gait and safety    Time  8    Period  Weeks    Status  New      PT LONG TERM GOAL #2   Title  increase Berg balance score to 46/56 for safety    Time  8    Period  Weeks    Status  New      PT LONG TERM GOAL #3   Title  increase LE strength (particularly ABD and ext.) to 4/5 for functional gait and safety    Time  8    Period  Weeks    Status  New      PT LONG TERM GOAL #4   Title  be able to do a sit to stand without assistance and without losing balance for functional use    Time  8    Period  Weeks    Status  New            Plan - 01/20/18 1516    Clinical Impression Statement  pt very unstable and requiring HHA today ,fearful of falling. focus on balance and LE ex    PT Treatment/Interventions  Stair training;Gait training;Functional mobility training;Therapeutic activities;Therapeutic exercise;Balance training;Patient/family education    PT Next Visit Plan  gait/strength/balance       Patient will benefit from skilled therapeutic intervention in order to improve the following deficits and impairments:  Abnormal gait, Decreased strength, Decreased balance, Difficulty walking, Decreased activity tolerance, Decreased coordination, Decreased mobility  Visit Diagnosis: Muscle weakness (generalized)  Difficulty in walking, not elsewhere classified  Balance problem     Problem List Patient Active Problem List   Diagnosis Date Noted  . Psychogenic gait 10/21/2017  . Coronary artery disease involving native coronary artery of native heart without angina pectoris 06/09/2017  . Abnormal nuclear cardiac imaging test 05/25/2017  . PVC's (premature ventricular contractions) 05/17/2017  . Anxiety 12/29/2016  . Vascular parkinsonism ([place]) 06/29/2016  . Gait disorder 04/07/2016  . Intracranial carotid stenosis, bilateral 02/27/2016  . Essential hypertension   . Left-sided weakness 12/28/2015  . Headache 12/28/2015  . Adjustment  reaction with anxiety 12/20/2015  . Left hemiparesis ([place])   . History of CVA with residual deficit   . Prediabetes   . SVT (supraventricular tachycardia) ([place]) 12/14/2015  . Stroke-like symptom 12/13/2015  . History of stroke   . Heart palpitations 09/06/2012  . Hypertension   . Familial hyperlipidemia   . Hypothyroidism   . Vitamin D deficiency   .  Palpitations   . History of dizziness   . LVH (left ventricular hypertrophy)   . Aortic stenosis   . Mitral regurgitation   . SOB (shortness of breath)   . Difficulty walking     Malai Lady,ANGIE PTA 01/20/2018, 3:18 PM  [place] [address] [place], [place], [address] Phone: [phone]   Fax:  [phone]  Name: JULIZZA SASSONE MRN: [id number] Date of Birth: 02-Aug-1941

## 2018-01-25 ENCOUNTER — Ambulatory Visit: Payer: PPO | Admitting: Physical Therapy

## 2018-01-25 ENCOUNTER — Encounter: Payer: Self-pay | Admitting: Physical Therapy

## 2018-01-25 DIAGNOSIS — M6281 Muscle weakness (generalized): Secondary | ICD-10-CM | POA: Diagnosis not present

## 2018-01-25 DIAGNOSIS — R262 Difficulty in walking, not elsewhere classified: Secondary | ICD-10-CM

## 2018-01-25 DIAGNOSIS — R2689 Other abnormalities of gait and mobility: Secondary | ICD-10-CM

## 2018-01-25 NOTE — Therapy (Signed)
[place], [place], [address] Phone: [phone]   Fax:  [phone]  Physical Therapy Treatment  Patient Details  Name: Stacey Ramirez MRN: [id number] Date of Birth: 1941/04/18 Referring Provider: Shelia Media   Encounter Date: 01/25/2018  PT End of Session - 01/25/18 1221    Visit Number  4    Date for PT Re-Evaluation  02/12/18    PT Start Time  1140    PT Stop Time  1226    PT Time Calculation (min)  46 min       Past Medical History:  Diagnosis Date  . Adjustment reaction with anxiety 12/20/2015  . Aortic stenosis   . CAD (coronary artery disease) 06/09/2017   Nuc study 10/18: EF 69, inferolateral perfusion defect-possible small infarct with peri-infarct ischemia; intermediate risk // LHC 10/18: pLAD 25, pLCx 50, pRCA 25 >> med Rx  . Carotid stenosis, asymptomatic, bilateral 02/27/2016   Carotid US 5/17: Bilateral ICA 1-39  . Cerebrovascular accident (stroke) ([place])    a. 12/2015 - cryptogenic.  S/P MDT Linq.  . Difficulty walking   . Essential hypertension   . Gait disorder 04/07/2016  . Heart palpitations 09/06/2012  . History of CVA with residual deficit 12/14/2015  . History of dizziness   . History of echocardiogram    Echo 10/18: Vigorous LVF, EF 65-70, normal wall motion, grade 1 diastolic dysfunction, GLS -21.1%, mild RAE  . History of nuclear stress test    Nuclear stress test 10/18: EF 69, inf-lat defect c/w poss infarct with peri-infarct ischemia; Intermediate Risk  . Hyperlipidemia   . Hypothyroidism   . Left hemiparesis ([place])   . LVH (left ventricular hypertrophy)   . Mitral regurgitation    a. 12/2015 Echo: EF 60-65%, mild focal basal hypertrophy. No rwma, triv AI, mild MR, mildly dil LA w/o thrombus. No RA thrombus. No PFO.  Marland Kitchen Palpitations   . Prediabetes   . Stroke-like symptom 12/13/2015  . SVT (supraventricular tachycardia) ([place]) 12/14/2015  . Vascular parkinsonism ([place])  06/29/2016  . Vitamin D deficiency     Past Surgical History:  Procedure Laterality Date  . CARDIOVASCULAR STRESS TEST  2002   NORMAL  . EP IMPLANTABLE DEVICE N/A 12/17/2015   Procedure: Loop Recorder Insertion;  Surgeon: [name], MD;  Location: [place] [facility];  Service: Cardiovascular;  Laterality: N/A;  . LEFT HEART CATH AND CORONARY ANGIOGRAPHY N/A 05/25/2017   Procedure: LEFT HEART CATH AND CORONARY ANGIOGRAPHY;  Surgeon: Sherren Mocha, MD;  Location: [place] [facility];  Service: Cardiovascular;  Laterality: N/A;  . LOOP RECORDER IMPLANT    . TEE WITHOUT CARDIOVERSION N/A 12/17/2015   Procedure: TRANSESOPHAGEAL ECHOCARDIOGRAM (TEE);  Surgeon: Lelon Perla, MD;  Location: [facility] ENDOSCOPY;  Service: Cardiovascular;  Laterality: N/A;  Pt also needs a LOOP  . TRANSTHORACIC ECHOCARDIOGRAM  2008   SHOWED LEFT VENTRICULAR HYPERTROPHY AND MILD AORTIC STENOSIS    There were no vitals filed for this visit.  Subjective Assessment - 01/25/18 1139    Subjective  some days its just so hard to move,dizzy in head too. using walker in house at all times    Currently in Pain?  No/denies                       [facility] Adult PT Treatment/Exercise - 01/25/18 0001      High Level Balance   High Level Balance Activities  Side stepping;Marching forwards;Marching backwards;Tandem walking HHA 3#      Knee/Hip Exercises: Aerobic   Nustep  L5 x 8 min       Knee/Hip Exercises: Machines for Strengthening   Cybex Knee Extension  10lbs 2x10 LLE 5lb 1x10    Cybex Knee Flexion  20# 2 sets 10; 10lb LLE 1x10     Cybex Leg Press  20# 3 sets 10      Knee/Hip Exercises: Standing   Forward Step Up  Both;1 set;10 reps;Hand Hold: 2;Step Height: 6"    Other Standing Knee Exercises  6 inch step tap 3 sets 10      Knee/Hip Exercises: Seated   Sit to Sand  2 sets;10 reps;without UE support with wt ball               PT Short Term Goals - 01/13/18 1208      PT SHORT TERM GOAL  #1   Title  independent with her home HEP    Time  2    Period  Weeks    Status  New        PT Long Term Goals - 01/13/18 1258      PT LONG TERM GOAL #1   Title  decrease TUG time to 14 seconds for functional gait and safety    Time  8    Period  Weeks    Status  New      PT LONG TERM GOAL #2   Title  increase Berg balance score to 46/56 for safety    Time  8    Period  Weeks    Status  New      PT LONG TERM GOAL #3   Title  increase LE strength (particularly ABD and ext.) to 4/5 for functional gait and safety    Time  8    Period  Weeks    Status  New      PT LONG TERM GOAL #4   Title  be able to do a sit to stand without assistance and without losing balance for functional use    Time  8    Period  Weeks    Status  New            Plan - 01/25/18 1221    Clinical Impression Statement  pt fatigued today and fearful with balance actities without HHA. with cuing able to increase stride    PT Treatment/Interventions  Stair training;Gait training;Functional mobility training;Therapeutic activities;Therapeutic exercise;Balance training;Patient/family education    PT Next Visit Plan  gait/strength/balance- check goals       Patient will benefit from skilled therapeutic intervention in order to improve the following deficits and impairments:  Abnormal gait, Decreased strength, Decreased balance, Difficulty walking, Decreased activity tolerance, Decreased coordination, Decreased mobility  Visit Diagnosis: Muscle weakness (generalized)  Difficulty in walking, not elsewhere classified  Balance problem     Problem List Patient Active Problem List   Diagnosis Date Noted  . Psychogenic gait 10/21/2017  . Coronary artery disease involving native coronary artery of native heart without angina pectoris 06/09/2017  . Abnormal nuclear cardiac imaging test 05/25/2017  . PVC's (premature ventricular contractions) 05/17/2017  . Anxiety 12/29/2016  . Vascular  parkinsonism ([place]) 06/29/2016  . Gait disorder 04/07/2016  . Intracranial carotid stenosis, bilateral 02/27/2016  . Essential hypertension   . Left-sided weakness 12/28/2015  . Headache 12/28/2015  . Adjustment reaction with anxiety 12/20/2015  . Left hemiparesis ([place])   .  History of CVA with residual deficit   . Prediabetes   . SVT (supraventricular tachycardia) ([place]) 12/14/2015  . Stroke-like symptom 12/13/2015  . History of stroke   . Heart palpitations 09/06/2012  . Hypertension   . Familial hyperlipidemia   . Hypothyroidism   . Vitamin D deficiency   . Palpitations   . History of dizziness   . LVH (left ventricular hypertrophy)   . Aortic stenosis   . Mitral regurgitation   . SOB (shortness of breath)   . Difficulty walking     PAYSEUR,ANGIE PTA 01/25/2018, 12:23 PM  [place], [place], [address] Phone: [phone]   Fax:  [phone]  Name: Stacey Ramirez MRN: [id number] Date of Birth: [birth year]

## 2018-01-27 ENCOUNTER — Ambulatory Visit: Payer: PPO | Admitting: Physical Therapy

## 2018-01-27 ENCOUNTER — Encounter: Payer: Self-pay | Admitting: Physical Therapy

## 2018-01-27 DIAGNOSIS — F419 Anxiety disorder, unspecified: Secondary | ICD-10-CM

## 2018-01-27 DIAGNOSIS — I63411 Cerebral infarction due to embolism of right middle cerebral artery: Secondary | ICD-10-CM

## 2018-01-27 DIAGNOSIS — M6281 Muscle weakness (generalized): Secondary | ICD-10-CM | POA: Diagnosis not present

## 2018-01-27 DIAGNOSIS — R2689 Other abnormalities of gait and mobility: Secondary | ICD-10-CM

## 2018-01-27 DIAGNOSIS — G8194 Hemiplegia, unspecified affecting left nondominant side: Secondary | ICD-10-CM

## 2018-01-27 DIAGNOSIS — R262 Difficulty in walking, not elsewhere classified: Secondary | ICD-10-CM

## 2018-01-27 NOTE — Therapy (Signed)
[place], [place], [address] Phone: [phone]   Fax:  [phone]  Physical Therapy Treatment  Patient Details  Name: Stacey Ramirez MRN: [id number] Date of Birth: 04/11/1941 Referring Provider: Shelia Media   Encounter Date: 01/27/2018  PT End of Session - 01/27/18 1239    Visit Number  5    Date for PT Re-Evaluation  02/12/18    PT Start Time  9628    PT Stop Time  1225    PT Time Calculation (min)  40 min    Equipment Utilized During Treatment  Gait belt    Activity Tolerance  Patient tolerated treatment well    Behavior During Therapy  [facility] for tasks assessed/performed;Anxious       Past Medical History:  Diagnosis Date  . Adjustment reaction with anxiety 12/20/2015  . Aortic stenosis   . CAD (coronary artery disease) 06/09/2017   Nuc study 10/18: EF 69, inferolateral perfusion defect-possible small infarct with peri-infarct ischemia; intermediate risk // LHC 10/18: pLAD 25, pLCx 50, pRCA 25 >> med Rx  . Carotid stenosis, asymptomatic, bilateral 02/27/2016   Carotid US 5/17: Bilateral ICA 1-39  . Cerebrovascular accident (stroke) ([place])    a. 12/2015 - cryptogenic.  S/P MDT Linq.  . Difficulty walking   . Essential hypertension   . Gait disorder 04/07/2016  . Heart palpitations 09/06/2012  . History of CVA with residual deficit 12/14/2015  . History of dizziness   . History of echocardiogram    Echo 10/18: Vigorous LVF, EF 65-70, normal wall motion, grade 1 diastolic dysfunction, GLS -21.1%, mild RAE  . History of nuclear stress test    Nuclear stress test 10/18: EF 69, inf-lat defect c/w poss infarct with peri-infarct ischemia; Intermediate Risk  . Hyperlipidemia   . Hypothyroidism   . Left hemiparesis ([place])   . LVH (left ventricular hypertrophy)   . Mitral regurgitation    a. 12/2015 Echo: EF 60-65%, mild focal basal hypertrophy. No rwma, triv AI, mild MR, mildly dil LA w/o thrombus. No RA  thrombus. No PFO.  Marland Kitchen Palpitations   . Prediabetes   . Stroke-like symptom 12/13/2015  . SVT (supraventricular tachycardia) ([place]) 12/14/2015  . Vascular parkinsonism ([place]) 06/29/2016  . Vitamin D deficiency     Past Surgical History:  Procedure Laterality Date  . CARDIOVASCULAR STRESS TEST  2002   NORMAL  . EP IMPLANTABLE DEVICE N/A 12/17/2015   Procedure: Loop Recorder Insertion;  Surgeon: [name], MD;  Location: [place] [facility];  Service: Cardiovascular;  Laterality: N/A;  . LEFT HEART CATH AND CORONARY ANGIOGRAPHY N/A 05/25/2017   Procedure: LEFT HEART CATH AND CORONARY ANGIOGRAPHY;  Surgeon: Sherren Mocha, MD;  Location: [place] [facility];  Service: Cardiovascular;  Laterality: N/A;  . LOOP RECORDER IMPLANT    . TEE WITHOUT CARDIOVERSION N/A 12/17/2015   Procedure: TRANSESOPHAGEAL ECHOCARDIOGRAM (TEE);  Surgeon: Lelon Perla, MD;  Location: [facility] ENDOSCOPY;  Service: Cardiovascular;  Laterality: N/A;  Pt also needs a LOOP  . TRANSTHORACIC ECHOCARDIOGRAM  2008   SHOWED LEFT VENTRICULAR HYPERTROPHY AND MILD AORTIC STENOSIS    There were no vitals filed for this visit.  Subjective Assessment - 01/27/18 1151    Subjective  Pt reporting fall at home the night before last when she woke up in the middle in the night and fell backward and hit her head against the wall. Pt reported he husband had to help her up. Pt  reporting a "knot" on her L forehead and also reports hitting the back of her head. Pt did report yesterday she had a great day, but is having a bad day today.     Patient Stated Goals  being able to walk without being fearful and without having to use someone or an assistive device for stability    Currently in Pain?  No/denies                       [facility] Adult PT Treatment/Exercise - 01/27/18 0001      High Level Balance   High Level Balance Activities  Side stepping;Tandem walking;Marching forwards;Other (comment) HHA 3#    High Level Balance  Comments  working on recripical gait: opposite hand to knee while sitting, opposite hand pointing to opposite foot while performing LAQ's. performed same exercises standing with CGA and verbal and visual cues, also performed in supine position and performed modified dead bug with pt. Pt attempted gait with recripical arm swing but required CGA and needed correction throughout. Also worked on trunk roation in sitting and standing to initate arm swing      Knee/Hip Exercises: Aerobic   Nustep  L5 x 8 min              PT Education - 01/27/18 1229    Education provided  Yes    Education Details  recripical arm swing and home exercise    Person(s) Educated  Patient    Methods  Explanation;Demonstration;Verbal cues;Tactile cues    Comprehension  Verbalized understanding;Returned demonstration;Need further instruction;Verbal cues required       PT Short Term Goals - 01/27/18 1246      PT SHORT TERM GOAL #1   Title  independent with her home HEP    Baseline  balance at counter marching fwd/back,side stepping and tandem    Time  2    Period  Weeks    Status  On-going        PT Long Term Goals - 01/27/18 1246      PT LONG TERM GOAL #1   Title  decrease TUG time to 14 seconds for functional gait and safety    Baseline  13 sec    Time  8    Period  Weeks    Status  New      PT LONG TERM GOAL #2   Title  increase Berg balance score to 46/56 for safety    Status  New      PT LONG TERM GOAL #3   Title  increase LE strength (particularly ABD and ext.) to 4/5 for functional gait and safety    Baseline  4/5 hips and knees tested in sitting    Time  8    Period  Weeks    Status  On-going      PT LONG TERM GOAL #4   Title  be able to do a sit to stand without assistance and without losing balance for functional use    Baseline  distance meet but difficulty transitioning surfaces    Time  8    Period  Weeks    Status  New            Plan - 01/27/18 1239    Clinical  Impression Statement  Pt tolerating therapy well today after reporting a fall the night before last. Pt requiring hand held assistance with amb into clinic today with shuffeling gait pattern. Pt  reporting today was not a good day. We worked on progressing recripical gait pattern in supine, sitting and standing and walking. Pt struggled with cordination and focus/attention to task. Pt very talkative during session and requiring seated rest breaks. Continue skilled PT to progress toward improved balance and gait safety.     PT Frequency  2x / week    PT Duration  8 weeks    PT Treatment/Interventions  Stair training;Gait training;Functional mobility training;Therapeutic activities;Therapeutic exercise;Balance training;Patient/family education    PT Next Visit Plan  gait/strength/balance, opposite arm to knee/ opposite arm to leg both seated and standing, trunk rotation in sitting and standing    PT Home Exercise Plan  hip flex, ABD, ext. in standing with walker or kitchen counter    Consulted and Agree with Plan of Care  Patient       Patient will benefit from skilled therapeutic intervention in order to improve the following deficits and impairments:  Abnormal gait, Decreased strength, Decreased balance, Difficulty walking, Decreased activity tolerance, Decreased coordination, Decreased mobility  Visit Diagnosis: Muscle weakness (generalized)  Difficulty in walking, not elsewhere classified  Balance problem  Anxiety  Left hemiparesis (HCC)  Stroke due to embolism of right middle cerebral artery [facility])     Problem List Patient Active Problem List   Diagnosis Date Noted  . Psychogenic gait 10/21/2017  . Coronary artery disease involving native coronary artery of native heart without angina pectoris 06/09/2017  . Abnormal nuclear cardiac imaging test 05/25/2017  . PVC's (premature ventricular contractions) 05/17/2017  . Anxiety 12/29/2016  . Vascular parkinsonism ([place]) 06/29/2016  .  Gait disorder 04/07/2016  . Intracranial carotid stenosis, bilateral 02/27/2016  . Essential hypertension   . Left-sided weakness 12/28/2015  . Headache 12/28/2015  . Adjustment reaction with anxiety 12/20/2015  . Left hemiparesis ([place])   . History of CVA with residual deficit   . Prediabetes   . SVT (supraventricular tachycardia) ([place]) 12/14/2015  . Stroke-like symptom 12/13/2015  . History of stroke   . Heart palpitations 09/06/2012  . Hypertension   . Familial hyperlipidemia   . Hypothyroidism   . Vitamin D deficiency   . Palpitations   . History of dizziness   . LVH (left ventricular hypertrophy)   . Aortic stenosis   . Mitral regurgitation   . SOB (shortness of breath)   . Difficulty walking     Oretha Caprice, MPT 01/27/2018, 12:49 PM  [place] [address] [place] [place], [place], [address] Phone: [phone]   Fax:  [phone]  Name: CATILYN BOGGUS MRN: [id number] Date of Birth: 1940-09-25

## 2018-01-31 DIAGNOSIS — Z8673 Personal history of transient ischemic attack (TIA), and cerebral infarction without residual deficits: Secondary | ICD-10-CM | POA: Diagnosis not present

## 2018-01-31 DIAGNOSIS — R29898 Other symptoms and signs involving the musculoskeletal system: Secondary | ICD-10-CM | POA: Diagnosis not present

## 2018-01-31 DIAGNOSIS — R2681 Unsteadiness on feet: Secondary | ICD-10-CM | POA: Diagnosis not present

## 2018-02-01 ENCOUNTER — Telehealth: Payer: Self-pay | Admitting: Cardiology

## 2018-02-01 NOTE — Telephone Encounter (Signed)
LMOVM requesting that pt send manual transmission b/c home monitor has not updated in at least 14 days.    

## 2018-02-08 ENCOUNTER — Encounter: Payer: Self-pay | Admitting: Physical Therapy

## 2018-02-08 ENCOUNTER — Ambulatory Visit: Payer: PPO | Attending: Internal Medicine | Admitting: Physical Therapy

## 2018-02-08 DIAGNOSIS — R262 Difficulty in walking, not elsewhere classified: Secondary | ICD-10-CM | POA: Insufficient documentation

## 2018-02-08 DIAGNOSIS — I63411 Cerebral infarction due to embolism of right middle cerebral artery: Secondary | ICD-10-CM | POA: Diagnosis not present

## 2018-02-08 DIAGNOSIS — M6281 Muscle weakness (generalized): Secondary | ICD-10-CM | POA: Insufficient documentation

## 2018-02-08 DIAGNOSIS — F419 Anxiety disorder, unspecified: Secondary | ICD-10-CM | POA: Insufficient documentation

## 2018-02-08 DIAGNOSIS — R2689 Other abnormalities of gait and mobility: Secondary | ICD-10-CM | POA: Diagnosis not present

## 2018-02-08 NOTE — Therapy (Signed)
[place], [place], [address] Phone: [phone]   Fax:  [phone]  Physical Therapy Treatment  Patient Details  Name: Stacey Ramirez MRN: [id number] Date of Birth: 07/16/1941 Referring Provider: Shelia Media   Encounter Date: 02/08/2018  PT End of Session - 02/08/18 1314    Visit Number  6    Date for PT Re-Evaluation  02/12/18    PT Start Time  1230    PT Stop Time  1320    PT Time Calculation (min)  50 min       Past Medical History:  Diagnosis Date  . Adjustment reaction with anxiety 12/20/2015  . Aortic stenosis   . CAD (coronary artery disease) 06/09/2017   Nuc study 10/18: EF 69, inferolateral perfusion defect-possible small infarct with peri-infarct ischemia; intermediate risk // LHC 10/18: pLAD 25, pLCx 50, pRCA 25 >> med Rx  . Carotid stenosis, asymptomatic, bilateral 02/27/2016   Carotid US 5/17: Bilateral ICA 1-39  . Cerebrovascular accident (stroke) ([place])    a. 12/2015 - cryptogenic.  S/P MDT Linq.  . Difficulty walking   . Essential hypertension   . Gait disorder 04/07/2016  . Heart palpitations 09/06/2012  . History of CVA with residual deficit 12/14/2015  . History of dizziness   . History of echocardiogram    Echo 10/18: Vigorous LVF, EF 65-70, normal wall motion, grade 1 diastolic dysfunction, GLS -21.1%, mild RAE  . History of nuclear stress test    Nuclear stress test 10/18: EF 69, inf-lat defect c/w poss infarct with peri-infarct ischemia; Intermediate Risk  . Hyperlipidemia   . Hypothyroidism   . Left hemiparesis ([place])   . LVH (left ventricular hypertrophy)   . Mitral regurgitation    a. 12/2015 Echo: EF 60-65%, mild focal basal hypertrophy. No rwma, triv AI, mild MR, mildly dil LA w/o thrombus. No RA thrombus. No PFO.  Marland Kitchen Palpitations   . Prediabetes   . Stroke-like symptom 12/13/2015  . SVT (supraventricular tachycardia) ([place]) 12/14/2015  . Vascular parkinsonism ([place])  06/29/2016  . Vitamin D deficiency     Past Surgical History:  Procedure Laterality Date  . CARDIOVASCULAR STRESS TEST  2002   NORMAL  . EP IMPLANTABLE DEVICE N/A 12/17/2015   Procedure: Loop Recorder Insertion;  Surgeon: [name], MD;  Location: [place] [facility];  Service: Cardiovascular;  Laterality: N/A;  . LEFT HEART CATH AND CORONARY ANGIOGRAPHY N/A 05/25/2017   Procedure: LEFT HEART CATH AND CORONARY ANGIOGRAPHY;  Surgeon: Sherren Mocha, MD;  Location: [place] [facility];  Service: Cardiovascular;  Laterality: N/A;  . LOOP RECORDER IMPLANT    . TEE WITHOUT CARDIOVERSION N/A 12/17/2015   Procedure: TRANSESOPHAGEAL ECHOCARDIOGRAM (TEE);  Surgeon: Lelon Perla, MD;  Location: [facility] ENDOSCOPY;  Service: Cardiovascular;  Laterality: N/A;  Pt also needs a LOOP  . TRANSTHORACIC ECHOCARDIOGRAM  2008   SHOWED LEFT VENTRICULAR HYPERTROPHY AND MILD AORTIC STENOSIS    There were no vitals filed for this visit.  Subjective Assessment - 02/08/18 1252    Subjective  so fearful of falling I rely on my walker so much    Currently in Pain?  No/denies                       [facility] Adult PT Treatment/Exercise - 02/08/18 0001      Ambulation/Gait   Pre-Gait Activities  working on gait training with increased cadeance and stride with cuing  an dworked to build confidence    Gait Comments  recipricol gait with canes walking with ball toss      Knee/Hip Exercises: Aerobic   Tread Mill  1 mph 3 min to increase stride    Nustep  L5 x 8 min       Knee/Hip Exercises: Machines for Strengthening   Cybex Knee Extension  10lbs 2x10 LLE 5lb 1x10    Cybex Knee Flexion  20# 2 sets 10; 10lb LLE 1x10       Knee/Hip Exercises: Standing   SLS with Vectors  on airex with cone tpas- min A    Other Standing Knee Exercises  6 inch step tap alt 20 2 sets    Other Standing Knee Exercises  ball toss and hit with knee 2 sets 10               PT Short Term Goals - 01/27/18 1246       PT SHORT TERM GOAL #1   Title  independent with her home HEP    Baseline  balance at counter marching fwd/back,side stepping and tandem    Time  2    Period  Weeks    Status  On-going        PT Long Term Goals - 01/27/18 1246      PT LONG TERM GOAL #1   Title  decrease TUG time to 14 seconds for functional gait and safety    Baseline  13 sec    Time  8    Period  Weeks    Status  New      PT LONG TERM GOAL #2   Title  increase Berg balance score to 46/56 for safety    Status  New      PT LONG TERM GOAL #3   Title  increase LE strength (particularly ABD and ext.) to 4/5 for functional gait and safety    Baseline  4/5 hips and knees tested in sitting    Time  8    Period  Weeks    Status  On-going      PT LONG TERM GOAL #4   Title  be able to do a sit to stand without assistance and without losing balance for functional use    Baseline  distance meet but difficulty transitioning surfaces    Time  8    Period  Weeks    Status  New            Plan - 02/08/18 1314    Clinical Impression Statement  pt very fearful of falling and does not trust herself but with verb and tactile cuing noted improvement, turns are still difficult. tolerated strengtening well and added high level balance activities    PT Treatment/Interventions  Stair training;Gait training;Functional mobility training;Therapeutic activities;Therapeutic exercise;Balance training;Patient/family education    PT Next Visit Plan  progress with gait confidence and carry over       Patient will benefit from skilled therapeutic intervention in order to improve the following deficits and impairments:  Abnormal gait, Decreased strength, Decreased balance, Difficulty walking, Decreased activity tolerance, Decreased coordination, Decreased mobility  Visit Diagnosis: Muscle weakness (generalized)  Difficulty in walking, not elsewhere classified  Balance problem     Problem List Patient Active Problem List    Diagnosis Date Noted  . Psychogenic gait 10/21/2017  . Coronary artery disease involving native coronary artery of native heart without angina pectoris 06/09/2017  . Abnormal nuclear cardiac  imaging test 05/25/2017  . PVC's (premature ventricular contractions) 05/17/2017  . Anxiety 12/29/2016  . Vascular parkinsonism ([place]) 06/29/2016  . Gait disorder 04/07/2016  . Intracranial carotid stenosis, bilateral 02/27/2016  . Essential hypertension   . Left-sided weakness 12/28/2015  . Headache 12/28/2015  . Adjustment reaction with anxiety 12/20/2015  . Left hemiparesis ([place])   . History of CVA with residual deficit   . Prediabetes   . SVT (supraventricular tachycardia) ([place]) 12/14/2015  . Stroke-like symptom 12/13/2015  . History of stroke   . Heart palpitations 09/06/2012  . Hypertension   . Familial hyperlipidemia   . Hypothyroidism   . Vitamin D deficiency   . Palpitations   . History of dizziness   . LVH (left ventricular hypertrophy)   . Aortic stenosis   . Mitral regurgitation   . SOB (shortness of breath)   . Difficulty walking     Korry Dalgleish,ANGIE PTA 02/08/2018, 1:16 PM  [place], [place], [address] Phone: [phone]   Fax:  [phone]  Name: Stacey Ramirez MRN: [id number] Date of Birth: [birth year]

## 2018-02-10 ENCOUNTER — Ambulatory Visit: Payer: PPO | Admitting: Physical Therapy

## 2018-02-10 DIAGNOSIS — M6281 Muscle weakness (generalized): Secondary | ICD-10-CM

## 2018-02-10 DIAGNOSIS — R262 Difficulty in walking, not elsewhere classified: Secondary | ICD-10-CM

## 2018-02-10 DIAGNOSIS — R2689 Other abnormalities of gait and mobility: Secondary | ICD-10-CM

## 2018-02-10 NOTE — Addendum Note (Signed)
Addended by: Sumner Boast on: 02/10/2018 03:16 PM   Modules accepted: Orders

## 2018-02-10 NOTE — Therapy (Signed)
[place], [place], [address] Phone: [phone]   Fax:  [phone]  Physical Therapy Treatment  Patient Details  Name: Stacey Ramirez MRN: [id number] Date of Birth: Oct 11, 1940 Referring Provider: Shelia Media   Encounter Date: 02/10/2018  PT End of Session - 02/10/18 1436    Visit Number  7    Date for PT Re-Evaluation  02/12/18    PT Start Time  1400    PT Stop Time  1500    PT Time Calculation (min)  60 min       Past Medical History:  Diagnosis Date  . Adjustment reaction with anxiety 12/20/2015  . Aortic stenosis   . CAD (coronary artery disease) 06/09/2017   Nuc study 10/18: EF 69, inferolateral perfusion defect-possible small infarct with peri-infarct ischemia; intermediate risk // LHC 10/18: pLAD 25, pLCx 50, pRCA 25 >> med Rx  . Carotid stenosis, asymptomatic, bilateral 02/27/2016   Carotid US 5/17: Bilateral ICA 1-39  . Cerebrovascular accident (stroke) ([place])    a. 12/2015 - cryptogenic.  S/P MDT Linq.  . Difficulty walking   . Essential hypertension   . Gait disorder 04/07/2016  . Heart palpitations 09/06/2012  . History of CVA with residual deficit 12/14/2015  . History of dizziness   . History of echocardiogram    Echo 10/18: Vigorous LVF, EF 65-70, normal wall motion, grade 1 diastolic dysfunction, GLS -21.1%, mild RAE  . History of nuclear stress test    Nuclear stress test 10/18: EF 69, inf-lat defect c/w poss infarct with peri-infarct ischemia; Intermediate Risk  . Hyperlipidemia   . Hypothyroidism   . Left hemiparesis ([place])   . LVH (left ventricular hypertrophy)   . Mitral regurgitation    a. 12/2015 Echo: EF 60-65%, mild focal basal hypertrophy. No rwma, triv AI, mild MR, mildly dil LA w/o thrombus. No RA thrombus. No PFO.  Marland Kitchen Palpitations   . Prediabetes   . Stroke-like symptom 12/13/2015  . SVT (supraventricular tachycardia) ([place]) 12/14/2015  . Vascular parkinsonism ([place])  06/29/2016  . Vitamin D deficiency     Past Surgical History:  Procedure Laterality Date  . CARDIOVASCULAR STRESS TEST  2002   NORMAL  . EP IMPLANTABLE DEVICE N/A 12/17/2015   Procedure: Loop Recorder Insertion;  Surgeon: [name], MD;  Location: [place] [facility];  Service: Cardiovascular;  Laterality: N/A;  . LEFT HEART CATH AND CORONARY ANGIOGRAPHY N/A 05/25/2017   Procedure: LEFT HEART CATH AND CORONARY ANGIOGRAPHY;  Surgeon: Sherren Mocha, MD;  Location: [place] [facility];  Service: Cardiovascular;  Laterality: N/A;  . LOOP RECORDER IMPLANT    . TEE WITHOUT CARDIOVERSION N/A 12/17/2015   Procedure: TRANSESOPHAGEAL ECHOCARDIOGRAM (TEE);  Surgeon: Lelon Perla, MD;  Location: [facility] ENDOSCOPY;  Service: Cardiovascular;  Laterality: N/A;  Pt also needs a LOOP  . TRANSTHORACIC ECHOCARDIOGRAM  2008   SHOWED LEFT VENTRICULAR HYPERTROPHY AND MILD AORTIC STENOSIS    There were no vitals filed for this visit.  Subjective Assessment - 02/10/18 1359    Subjective  " i am here today" good and bad with walking    Currently in Pain?  No/denies                       [facility] Adult PT Treatment/Exercise - 02/10/18 0001      Ambulation/Gait   Gait Comments  worked on gait with increased stride, cadeance ,arm swing and looking up  cuing with all and if meet with ostacle STOPPED      High Level Balance   High Level Balance Activities  Negotitating around obstacles;Negotiating over obstacles difficulty with truns today and left leg dragging    High Level Balance Comments  stepping on postits on ground for balance and stride      Knee/Hip Exercises: Aerobic   Nustep  L5 x 8 min       Knee/Hip Exercises: Machines for Strengthening   Cybex Knee Extension  10lbs 2x10 LLE 5lb 1x10    Cybex Knee Flexion  20# 2 sets 10; 10lb LLE 1x10                PT Short Term Goals - 01/27/18 1246      PT SHORT TERM GOAL #1   Title  independent with her home HEP    Baseline   balance at counter marching fwd/back,side stepping and tandem    Time  2    Period  Weeks    Status  On-going        PT Long Term Goals - 02/10/18 1439      PT LONG TERM GOAL #1   Title  decrease TUG time to 14 seconds for functional gait and safety    Baseline  16 sec    Status  Partially Met      PT LONG TERM GOAL #2   Title  increase Berg balance score to 46/56 for safety    Status  On-going      PT LONG TERM GOAL #3   Title  increase LE strength (particularly ABD and ext.) to 4/5 for functional gait and safety    Status  On-going      PT LONG TERM GOAL #4   Title  be able to do a sit to stand without assistance and without losing balance for functional use    Status  On-going            Plan - 02/10/18 1438    Clinical Impression Statement  focus on high level balance activities today- difficulty for LOB on non compliant surfaces and stepping over/on objects    PT Treatment/Interventions  Stair training;Gait training;Functional mobility training;Therapeutic activities;Therapeutic exercise;Balance training;Patient/family education    PT Next Visit Plan  progress with gait confidence and carry over- [place]       Patient will benefit from skilled therapeutic intervention in order to improve the following deficits and impairments:  Abnormal gait, Decreased strength, Decreased balance, Difficulty walking, Decreased activity tolerance, Decreased coordination, Decreased mobility  Visit Diagnosis: Muscle weakness (generalized)  Difficulty in walking, not elsewhere classified  Balance problem     Problem List Patient Active Problem List   Diagnosis Date Noted  . Psychogenic gait 10/21/2017  . Coronary artery disease involving native coronary artery of native heart without angina pectoris 06/09/2017  . Abnormal nuclear cardiac imaging test 05/25/2017  . PVC's (premature ventricular contractions) 05/17/2017  . Anxiety 12/29/2016  . Vascular  parkinsonism ([place]) 06/29/2016  . Gait disorder 04/07/2016  . Intracranial carotid stenosis, bilateral 02/27/2016  . Essential hypertension   . Left-sided weakness 12/28/2015  . Headache 12/28/2015  . Adjustment reaction with anxiety 12/20/2015  . Left hemiparesis ([place])   . History of CVA with residual deficit   . Prediabetes   . SVT (supraventricular tachycardia) ([place]) 12/14/2015  . Stroke-like symptom 12/13/2015  . History of stroke   . Heart palpitations 09/06/2012  . Hypertension   .  Familial hyperlipidemia   . Hypothyroidism   . Vitamin D deficiency   . Palpitations   . History of dizziness   . LVH (left ventricular hypertrophy)   . Aortic stenosis   . Mitral regurgitation   . SOB (shortness of breath)   . Difficulty walking     Dodd Schmid,ANGIE PTA 02/10/2018, 2:40 PM  [place], [place], [address] Phone: [phone]   Fax:  [phone]  Name: Stacey Ramirez MRN: [id number] Date of Birth: June 16, 1941

## 2018-02-15 ENCOUNTER — Ambulatory Visit: Payer: PPO | Admitting: Physical Therapy

## 2018-02-15 ENCOUNTER — Encounter: Payer: Self-pay | Admitting: *Deleted

## 2018-02-15 ENCOUNTER — Encounter: Payer: Self-pay | Admitting: Physical Therapy

## 2018-02-15 DIAGNOSIS — R262 Difficulty in walking, not elsewhere classified: Secondary | ICD-10-CM

## 2018-02-15 DIAGNOSIS — R2689 Other abnormalities of gait and mobility: Secondary | ICD-10-CM

## 2018-02-15 DIAGNOSIS — M6281 Muscle weakness (generalized): Secondary | ICD-10-CM | POA: Diagnosis not present

## 2018-02-15 DIAGNOSIS — F419 Anxiety disorder, unspecified: Secondary | ICD-10-CM

## 2018-02-15 NOTE — Therapy (Signed)
[place], [place], [address] Phone: [phone]   Fax:  [phone]  Physical Therapy Treatment  Patient Details  Name: Stacey Ramirez MRN: [id number] Date of Birth: [birth year] Referring Provider: Shelia Media   Encounter Date: 02/15/2018  PT End of Session - 02/15/18 1432    Visit Number  8    PT Start Time  0258    PT Stop Time  5277    PT Time Calculation (min)  47 min    Activity Tolerance  Patient tolerated treatment well    Behavior During Therapy  [facility] for tasks assessed/performed;Anxious       Past Medical History:  Diagnosis Date  . Adjustment reaction with anxiety 12/20/2015  . Aortic stenosis   . CAD (coronary artery disease) 06/09/2017   Nuc study 10/18: EF 69, inferolateral perfusion defect-possible small infarct with peri-infarct ischemia; intermediate risk // LHC 10/18: pLAD 25, pLCx 50, pRCA 25 >> med Rx  . Carotid stenosis, asymptomatic, bilateral 02/27/2016   Carotid US 5/17: Bilateral ICA 1-39  . Cerebrovascular accident (stroke) ([place])    a. 12/2015 - cryptogenic.  S/P MDT Linq.  . Difficulty walking   . Essential hypertension   . Gait disorder 04/07/2016  . Heart palpitations 09/06/2012  . History of CVA with residual deficit 12/14/2015  . History of dizziness   . History of echocardiogram    Echo 10/18: Vigorous LVF, EF 65-70, normal wall motion, grade 1 diastolic dysfunction, GLS -21.1%, mild RAE  . History of nuclear stress test    Nuclear stress test 10/18: EF 69, inf-lat defect c/w poss infarct with peri-infarct ischemia; Intermediate Risk  . Hyperlipidemia   . Hypothyroidism   . Left hemiparesis ([place])   . LVH (left ventricular hypertrophy)   . Mitral regurgitation    a. 12/2015 Echo: EF 60-65%, mild focal basal hypertrophy. No rwma, triv AI, mild MR, mildly dil LA w/o thrombus. No RA thrombus. No PFO.  Marland Kitchen Palpitations   . Prediabetes   . Stroke-like symptom 12/13/2015  . SVT  (supraventricular tachycardia) ([place]) 12/14/2015  . Vascular parkinsonism ([place]) 06/29/2016  . Vitamin D deficiency     Past Surgical History:  Procedure Laterality Date  . CARDIOVASCULAR STRESS TEST  2002   NORMAL  . EP IMPLANTABLE DEVICE N/A 12/17/2015   Procedure: Loop Recorder Insertion;  Surgeon: [name], MD;  Location: [facility];  Service: Cardiovascular;  Laterality: N/A;  . LEFT HEART CATH AND CORONARY ANGIOGRAPHY N/A 05/25/2017   Procedure: LEFT HEART CATH AND CORONARY ANGIOGRAPHY;  Surgeon: Sherren Mocha, MD;  Location: [facility];  Service: Cardiovascular;  Laterality: N/A;  . LOOP RECORDER IMPLANT    . TEE WITHOUT CARDIOVERSION N/A 12/17/2015   Procedure: TRANSESOPHAGEAL ECHOCARDIOGRAM (TEE);  Surgeon: Lelon Perla, MD;  Location: [facility] ENDOSCOPY;  Service: Cardiovascular;  Laterality: N/A;  Pt also needs a LOOP  . TRANSTHORACIC ECHOCARDIOGRAM  2008   SHOWED LEFT VENTRICULAR HYPERTROPHY AND MILD AORTIC STENOSIS    There were no vitals filed for this visit.  Subjective Assessment - 02/15/18 1354    Subjective  "Feeling pretty good"    Currently in Pain?  No/denies                       [facility] Adult PT Treatment/Exercise - 02/15/18 0001      High Level Balance   High Level Balance Comments  lateral side step  over foam roll, forward and backward steps over foam rolls, standing tband rows 2x15       Knee/Hip Exercises: Aerobic   Nustep  L4 x 7 min       Knee/Hip Exercises: Machines for Strengthening   Cybex Leg Press  30lb 2x10, 20lb LLE 3x5      Knee/Hip Exercises: Standing   Other Standing Knee Exercises  Standing march 3lb 2x7 1 sec hold      Knee/Hip Exercises: Seated   Sit to Sand  2 sets;10 reps;without UE support holding green then red ball                PT Short Term Goals - 01/27/18 1246      PT SHORT TERM GOAL #1   Title  independent with her home HEP    Baseline  balance at counter marching fwd/back,side  stepping and tandem    Time  2    Period  Weeks    Status  On-going        PT Long Term Goals - 02/15/18 1433      PT LONG TERM GOAL #1   Title  decrease TUG time to 14 seconds for functional gait and safety    Status  Partially Met      PT LONG TERM GOAL #2   Title  increase Berg balance score to 46/56 for safety    Status  On-going            Plan - 02/15/18 1433    Clinical Impression Statement  overall pt did well with today's activities, some difficulty noted when stepping over foam rolls. Good balance and control with standing weighted marches. Able to perform sit to stand holding weighted ball from blue chair, cues needed not to allow Le to push up against  chair when standing. Good strength with SL on leg press but fatigues quick requiring some assist.     Rehab Potential  Fair    PT Frequency  2x / week    PT Duration  8 weeks    PT Treatment/Interventions  Stair training;Gait training;Functional mobility training;Therapeutic activities;Therapeutic exercise;Balance training;Patient/family education    PT Next Visit Plan  progress with gait confidence and carry over       Patient will benefit from skilled therapeutic intervention in order to improve the following deficits and impairments:  Abnormal gait, Decreased strength, Decreased balance, Difficulty walking, Decreased activity tolerance, Decreased coordination, Decreased mobility  Visit Diagnosis: Muscle weakness (generalized)  Difficulty in walking, not elsewhere classified  Anxiety  Balance problem     Problem List Patient Active Problem List   Diagnosis Date Noted  . Psychogenic gait 10/21/2017  . Coronary artery disease involving native coronary artery of native heart without angina pectoris 06/09/2017  . Abnormal nuclear cardiac imaging test 05/25/2017  . PVC's (premature ventricular contractions) 05/17/2017  . Anxiety 12/29/2016  . Vascular parkinsonism ([place]) 06/29/2016  . Gait disorder  04/07/2016  . Intracranial carotid stenosis, bilateral 02/27/2016  . Essential hypertension   . Left-sided weakness 12/28/2015  . Headache 12/28/2015  . Adjustment reaction with anxiety 12/20/2015  . Left hemiparesis ([place])   . History of CVA with residual deficit   . Prediabetes   . SVT (supraventricular tachycardia) ([place]) 12/14/2015  . Stroke-like symptom 12/13/2015  . History of stroke   . Heart palpitations 09/06/2012  . Hypertension   . Familial hyperlipidemia   . Hypothyroidism   . Vitamin D deficiency   . Palpitations   .  History of dizziness   . LVH (left ventricular hypertrophy)   . Aortic stenosis   . Mitral regurgitation   . SOB (shortness of breath)   . Difficulty walking     Scot Jun, PTA 02/15/2018, 2:36 PM  [place], [place], [address] Phone: [phone]   Fax:  [phone]  Name: Stacey Ramirez MRN: [id number] Date of Birth: 10/01/1940

## 2018-02-17 ENCOUNTER — Ambulatory Visit: Payer: PPO | Admitting: Physical Therapy

## 2018-02-17 ENCOUNTER — Encounter: Payer: Self-pay | Admitting: Physical Therapy

## 2018-02-17 DIAGNOSIS — M6281 Muscle weakness (generalized): Secondary | ICD-10-CM

## 2018-02-17 DIAGNOSIS — R262 Difficulty in walking, not elsewhere classified: Secondary | ICD-10-CM

## 2018-02-17 DIAGNOSIS — I63411 Cerebral infarction due to embolism of right middle cerebral artery: Secondary | ICD-10-CM

## 2018-02-17 DIAGNOSIS — R2689 Other abnormalities of gait and mobility: Secondary | ICD-10-CM

## 2018-02-17 DIAGNOSIS — F419 Anxiety disorder, unspecified: Secondary | ICD-10-CM

## 2018-02-17 NOTE — Therapy (Signed)
[place], [place], [address] Phone: [phone]   Fax:  [phone]  Physical Therapy Treatment  Patient Details  Name: Stacey Ramirez MRN: [id number] Date of Birth: 1940/09/12 Referring Provider: Shelia Media   Encounter Date: 02/17/2018  PT End of Session - 02/17/18 1105    Visit Number  9    Date for PT Re-Evaluation  03/16/18    PT Start Time  5009    PT Stop Time  1104    PT Time Calculation (min)  49 min    Activity Tolerance  Patient tolerated treatment well    Behavior During Therapy  [facility] for tasks assessed/performed;Anxious       Past Medical History:  Diagnosis Date  . Adjustment reaction with anxiety 12/20/2015  . Aortic stenosis   . CAD (coronary artery disease) 06/09/2017   Nuc study 10/18: EF 69, inferolateral perfusion defect-possible small infarct with peri-infarct ischemia; intermediate risk // LHC 10/18: pLAD 25, pLCx 50, pRCA 25 >> med Rx  . Carotid stenosis, asymptomatic, bilateral 02/27/2016   Carotid US 5/17: Bilateral ICA 1-39  . Cerebrovascular accident (stroke) ([place])    a. 12/2015 - cryptogenic.  S/P MDT Linq.  . Difficulty walking   . Essential hypertension   . Gait disorder 04/07/2016  . Heart palpitations 09/06/2012  . History of CVA with residual deficit 12/14/2015  . History of dizziness   . History of echocardiogram    Echo 10/18: Vigorous LVF, EF 65-70, normal wall motion, grade 1 diastolic dysfunction, GLS -21.1%, mild RAE  . History of nuclear stress test    Nuclear stress test 10/18: EF 69, inf-lat defect c/w poss infarct with peri-infarct ischemia; Intermediate Risk  . Hyperlipidemia   . Hypothyroidism   . Left hemiparesis ([place])   . LVH (left ventricular hypertrophy)   . Mitral regurgitation    a. 12/2015 Echo: EF 60-65%, mild focal basal hypertrophy. No rwma, triv AI, mild MR, mildly dil LA w/o thrombus. No RA thrombus. No PFO.  Marland Kitchen Palpitations   . Prediabetes    . Stroke-like symptom 12/13/2015  . SVT (supraventricular tachycardia) ([place]) 12/14/2015  . Vascular parkinsonism ([place]) 06/29/2016  . Vitamin D deficiency     Past Surgical History:  Procedure Laterality Date  . CARDIOVASCULAR STRESS TEST  2002   NORMAL  . EP IMPLANTABLE DEVICE N/A 12/17/2015   Procedure: Loop Recorder Insertion;  Surgeon: [name], MD;  Location: [facility];  Service: Cardiovascular;  Laterality: N/A;  . LEFT HEART CATH AND CORONARY ANGIOGRAPHY N/A 05/25/2017   Procedure: LEFT HEART CATH AND CORONARY ANGIOGRAPHY;  Surgeon: Sherren Mocha, MD;  Location: [place] [facility];  Service: Cardiovascular;  Laterality: N/A;  . LOOP RECORDER IMPLANT    . TEE WITHOUT CARDIOVERSION N/A 12/17/2015   Procedure: TRANSESOPHAGEAL ECHOCARDIOGRAM (TEE);  Surgeon: Lelon Perla, MD;  Location: [facility] ENDOSCOPY;  Service: Cardiovascular;  Laterality: N/A;  Pt also needs a LOOP  . TRANSTHORACIC ECHOCARDIOGRAM  2008   SHOWED LEFT VENTRICULAR HYPERTROPHY AND MILD AORTIC STENOSIS    There were no vitals filed for this visit.  Subjective Assessment - 02/17/18 1023    Subjective  Pt reports soreness in her LLE leg from the leg press    Currently in Pain?  No/denies    Pain Score  0-No pain  [facility] Adult PT Treatment/Exercise - 02/17/18 0001      High Level Balance   High Level Balance Activities  Marching forwards    High Level Balance Comments  step ups on fom roll, side steps on foam roll      Knee/Hip Exercises: Aerobic   Nustep  L4 x 7 min       Knee/Hip Exercises: Machines for Strengthening   Cybex Knee Extension  10lbs 2x10 LLE 5lb 1x10    Cybex Knee Flexion  20# 2 sets 10; 10lb LLE 2x10       Knee/Hip Exercises: Standing   Other Standing Knee Exercises  on airex marching               PT Short Term Goals - 01/27/18 1246      PT SHORT TERM GOAL #1   Title  independent with her home HEP    Baseline  balance at  counter marching fwd/back,side stepping and tandem    Time  2    Period  Weeks    Status  On-going        PT Long Term Goals - 02/15/18 1433      PT LONG TERM GOAL #1   Title  decrease TUG time to 14 seconds for functional gait and safety    Status  Partially Met      PT LONG TERM GOAL #2   Title  increase Berg balance score to 46/56 for safety    Status  On-going            Plan - 02/17/18 1106    Clinical Impression Statement  Constant cues needed to increase hip flexion during forward marching. Some posterior LOB with exercises on non compliant surfaces, with min assist needed at times to correct. Tactile cues needed at hips to keep pt facing forward with side steps. No issues with seated leg curls and extensions    Rehab Potential  Fair    PT Frequency  2x / week    PT Duration  8 weeks    PT Treatment/Interventions  Stair training;Gait training;Functional mobility training;Therapeutic activities;Therapeutic exercise;Balance training;Patient/family education    PT Next Visit Plan  progress with gait confidence and carry over       Patient will benefit from skilled therapeutic intervention in order to improve the following deficits and impairments:  Abnormal gait, Decreased strength, Decreased balance, Difficulty walking, Decreased activity tolerance, Decreased coordination, Decreased mobility  Visit Diagnosis: Difficulty in walking, not elsewhere classified  Muscle weakness (generalized)  Anxiety  Balance problem  Stroke due to embolism of right middle cerebral artery [facility])     Problem List Patient Active Problem List   Diagnosis Date Noted  . Psychogenic gait 10/21/2017  . Coronary artery disease involving native coronary artery of native heart without angina pectoris 06/09/2017  . Abnormal nuclear cardiac imaging test 05/25/2017  . PVC's (premature ventricular contractions) 05/17/2017  . Anxiety 12/29/2016  . Vascular parkinsonism ([place]) 06/29/2016  .  Gait disorder 04/07/2016  . Intracranial carotid stenosis, bilateral 02/27/2016  . Essential hypertension   . Left-sided weakness 12/28/2015  . Headache 12/28/2015  . Adjustment reaction with anxiety 12/20/2015  . Left hemiparesis ([place])   . History of CVA with residual deficit   . Prediabetes   . SVT (supraventricular tachycardia) ([place]) 12/14/2015  . Stroke-like symptom 12/13/2015  . History of stroke   . Heart palpitations 09/06/2012  . Hypertension   . Familial hyperlipidemia   . Hypothyroidism   .  Vitamin D deficiency   . Palpitations   . History of dizziness   . LVH (left ventricular hypertrophy)   . Aortic stenosis   . Mitral regurgitation   . SOB (shortness of breath)   . Difficulty walking     Scot Jun, PTA 02/17/2018, 11:17 AM  [place], [place], [address] Phone: [phone]   Fax:  [phone]  Name: Stacey Ramirez MRN: [id number] Date of Birth: 1940-11-30

## 2018-02-21 ENCOUNTER — Ambulatory Visit: Payer: PPO | Admitting: Internal Medicine

## 2018-02-21 ENCOUNTER — Ambulatory Visit (INDEPENDENT_AMBULATORY_CARE_PROVIDER_SITE_OTHER): Payer: PPO | Admitting: *Deleted

## 2018-02-21 ENCOUNTER — Encounter: Payer: Self-pay | Admitting: Internal Medicine

## 2018-02-21 VITALS — BP 149/80 | HR 89 | Ht 62.0 in | Wt 119.0 lb

## 2018-02-21 DIAGNOSIS — Z8673 Personal history of transient ischemic attack (TIA), and cerebral infarction without residual deficits: Secondary | ICD-10-CM | POA: Diagnosis not present

## 2018-02-21 DIAGNOSIS — I639 Cerebral infarction, unspecified: Secondary | ICD-10-CM

## 2018-02-21 DIAGNOSIS — E7801 Familial hypercholesterolemia: Secondary | ICD-10-CM | POA: Diagnosis not present

## 2018-02-21 DIAGNOSIS — I10 Essential (primary) hypertension: Secondary | ICD-10-CM | POA: Diagnosis not present

## 2018-02-21 MED ORDER — EZETIMIBE 10 MG PO TABS: 10.0000 mg | ORAL_TABLET | Freq: Every day | ORAL | 3 refills | Status: DC

## 2018-02-21 NOTE — Progress Notes (Signed)
Carelink Summary Report / Loop Recorder 

## 2018-02-21 NOTE — Progress Notes (Signed)
OFFICE NOTE  Chief Complaint:  Follow-up dyslipidemia  Primary Care Physician: Deland Pretty, MD  HPI:  Stacey Ramirez is a 77 y.o. female with a past medial history significant for coronary artery disease with mild to moderate multivessel disease by cardiac catheterization October 2018, bilateral carotid artery stenosis, history of stroke and severe dyslipidemia.  Most recently in February 2019, total cholesterol was 385, triglycerides 183, HDL 47 LDL 301, and non-HDL cholesterol of 338.  [name] is had long-standing dyslipidemia, but has been intolerant to medications, particularly the statins.  She is tried simvastatin, rosuvastatin, atorvastatin and pravastatin, all of which cause significant lower extremity weakness to the point for which she could barely stand up.  Based on this she was seen in our lipid clinic in started initially on Praluent.  She seemed to have some improvement in her lipid profile on Praluent, then her cholesterol rebounded.  She was switched to Repatha, but there was no significant reduction in cholesterol.  This is concerning for possible genetic mutation that may render the medication ineffective.  She was then considered for the CLEAR trial of bempedoic acid.  Notes indicated that she was not interested in the trial, primarily that they were concerned that they would be randomized either to receive the drug or placebo.  These findings are highly concerning for familial hyperlipidemia.  She does have a history of heart disease in her family, notably on her mother side of the family with her mother having had a heart attack.  Her [place] lipid score is 9, consistent with definite FH.  02/21/2018  [name] returns today for follow-up.  She reported that she thought she was having more leg pain on the ezetimibe and discontinued it.  Unfortunately the symptoms did not resolve and she continues to have problems with her leg.  She and her husband discussed today whether  they should consider restarting it.  She has not had repeat lipid testing, therefore it is unclear whether her dyslipidemia has improved.  She did undergo genetic counseling with Dr. Broadus John and samples apparently were taken however I have not yet received the results of that testing as to why she may be resistant to PCSK9 inhibitors.  PMHx:  Past Medical History:  Diagnosis Date  . Adjustment reaction with anxiety 12/20/2015  . Aortic stenosis   . CAD (coronary artery disease) 06/09/2017   Nuc study 10/18: EF 69, inferolateral perfusion defect-possible small infarct with peri-infarct ischemia; intermediate risk // LHC 10/18: pLAD 25, pLCx 50, pRCA 25 >> med Rx  . Carotid stenosis, asymptomatic, bilateral 02/27/2016   Carotid US 5/17: Bilateral ICA 1-39  . Cerebrovascular accident (stroke) ([place])    a. 12/2015 - cryptogenic.  S/P MDT Linq.  . Difficulty walking   . Essential hypertension   . Gait disorder 04/07/2016  . Heart palpitations 09/06/2012  . History of CVA with residual deficit 12/14/2015  . History of dizziness   . History of echocardiogram    Echo 10/18: Vigorous LVF, EF 65-70, normal wall motion, grade 1 diastolic dysfunction, GLS -21.1%, mild RAE  . History of nuclear stress test    Nuclear stress test 10/18: EF 69, inf-lat defect c/w poss infarct with peri-infarct ischemia; Intermediate Risk  . Hyperlipidemia   . Hypothyroidism   . Left hemiparesis ([place])   . LVH (left ventricular hypertrophy)   . Mitral regurgitation    a. 12/2015 Echo: EF 60-65%, mild focal basal hypertrophy. No rwma, triv AI, mild MR, mildly dil  LA w/o thrombus. No RA thrombus. No PFO.  Marland Kitchen Palpitations   . Prediabetes   . Stroke-like symptom 12/13/2015  . SVT (supraventricular tachycardia) ([place]) 12/14/2015  . Vascular parkinsonism ([place]) 06/29/2016  . Vitamin D deficiency     Past Surgical History:  Procedure Laterality Date  . CARDIOVASCULAR STRESS TEST  2002   NORMAL  . EP IMPLANTABLE DEVICE N/A 12/17/2015    Procedure: Loop Recorder Insertion;  Surgeon: [name], MD;  Location: [place] [facility];  Service: Cardiovascular;  Laterality: N/A;  . LEFT HEART CATH AND CORONARY ANGIOGRAPHY N/A 05/25/2017   Procedure: LEFT HEART CATH AND CORONARY ANGIOGRAPHY;  Surgeon: Sherren Mocha, MD;  Location: [facility];  Service: Cardiovascular;  Laterality: N/A;  . LOOP RECORDER IMPLANT    . TEE WITHOUT CARDIOVERSION N/A 12/17/2015   Procedure: TRANSESOPHAGEAL ECHOCARDIOGRAM (TEE);  Surgeon: Lelon Perla, MD;  [facility] END[facility]COPY;  Service: Cardiovascular;  Laterality: N/A;  Pt also needs a LOOP  . TRANSTHORACIC ECHOCARDIOGRAM  2008   SHOWED LEFT VENTRICULAR HYPERTROPHY AND MILD AORTIC STENOSIS    FAMHx:  Family History  Problem Relation Age of Onset  . Heart attack Mother   . Alzheimer's disease Mother   . Stroke Paternal Uncle     SOCHx:   reports that she has never smoked. She has never used smokeless tobacco. She reports that she drinks about 0.6 oz of alcohol per week. She reports that she does not use drugs.  ALLERGIES:  Allergies  Allergen Reactions  . Sulfa Drugs Cross Reactors Itching  . Zocor [Simvastatin] Other (See Comments)    Feels like she is going to pass out, weakness  . Crestor [Rosuvastatin Calcium]     Muscle weakness  . Statins Other (See Comments)    Unable to walk or stand. Unable to walk or stand. Unable to walk or stand.  . Sulfa Antibiotics     welts  . Micardis [Telmisartan] Other (See Comments)    Feels like she is going to pass out    ROS: Pertinent items noted in HPI and remainder of comprehensive ROS otherwise negative.  HOME MEDS: Current Outpatient Medications on File Prior to Visit  Medication Sig Dispense Refill  . ALPRAZolam (XANAX) 0.25 MG tablet Take 0.25 mg by mouth at bedtime as needed for anxiety or sleep.    Marland Kitchen amLODipine (NORVASC) 5 MG tablet Take 1 tablet by mouth daily.  3  . carboxymethylcellulose (REFRESH PLUS) 0.5 % SOLN  Place 1 drop into both eyes 3 (three) times daily as needed (DRY EYE).     . Cholecalciferol (VITAMIN D) 2000 units tablet Take 2,000 Units by mouth daily.    . clopidogrel (PLAVIX) 75 MG tablet Take 1 tablet (75 mg total) by mouth daily. 30 tablet 0  . irbesartan (AVAPRO) 300 MG tablet Take 1 tablet by mouth daily.  0  . levothyroxine (SYNTHROID, LEVOTHROID) 25 MCG tablet Take 25 mcg by mouth daily before breakfast.   6  . loratadine (CLARITIN) 10 MG tablet Take 10 mg by mouth daily as needed for allergies.     Marland Kitchen LOTEMAX 0.5 % ophthalmic suspension Place 1 drop into both eyes 4 (four) times daily as needed. For dry itchy eyes    . Multiple Vitamin (MULTIVITAMIN) tablet Take 1 tablet by mouth daily.    . sertraline (ZOLOFT) 50 MG tablet Take 50 mg by mouth daily.      No current facility-administered medications on file prior to visit.  LABS/IMAGING: No results found for this or any previous visit (from the past 48 hour(s)). No results found.  LIPID PANEL:    Component Value Date/Time   CHOL 385 (H) 09/13/2017 1453   TRIG 183 (H) 09/13/2017 1453   HDL 47 09/13/2017 1453   CHOLHDL 8.2 (H) 09/13/2017 1453   CHOLHDL 4.8 12/13/2015 1558   VLDL 12 12/13/2015 1558   LDLCALC 301 (H) 09/13/2017 1453   LDLDIRECT 224.6 12/14/2011 0919     WEIGHTS: Wt Readings from Last 3 Encounters:  02/21/18 119 lb (54 kg)  11/29/17 119 lb (54 kg)  10/20/17 120 lb 9.6 oz (54.7 kg)    VITALS: BP (!) 149/80   Pulse 89   Ht 5\' 2"  (1.575 m)   Wt 119 lb (54 kg)   BMI 21.77 kg/m   EXAM: Deferred  EKG: Deferred  ASSESSMENT: 1. Definite FH-Dutch score of 9, probable HeFH 2. Mild to moderate nonobstructive coronary disease 3. Cerebrovascular disease with prior stroke 4. Statin intolerance 5. Non-responder to PCSK9  PLAN: 1.   Mrs. Priestly has been off of her ezetimibe and was a nonresponder to PCSK9 inhibitors.  Genetic testing has been performed however those results are not back yet.  I  will contact Dr. Broadus John for those results.  The patient felt that she might have had side effects with ezetimibe including leg weakness however that is not significantly improved and may be related to prior stroke.  I encouraged her to restart the ezetimibe and will repeat a lipid profile in 3 months.  I will reach out to her again when I receive the results of her genetic testing.  Pixie Casino, MD, [facility], [place] Director of the Advanced Lipid Disorders &  Cardiovascular Risk Reduction Clinic Diplomate of the American Board of Clinical Lipidology Attending Cardiologist  Direct Dial: [phone]  Fax: [phone]  Website:  [email or web address] 02/21/2018, 10:50 AM

## 2018-02-21 NOTE — Patient Instructions (Addendum)
Your physician has recommended you make the following change in your medication: RESUME zetia 10mg  once daily  Your physician recommends that you return for lab work in [place] (fasting) to check cholesterol   Your physician recommends that you schedule an appointment in [place] with Dr. Debara Pickett for lipid clinic follow up

## 2018-02-22 ENCOUNTER — Encounter: Payer: Self-pay | Admitting: Physical Therapy

## 2018-02-22 ENCOUNTER — Ambulatory Visit: Payer: PPO | Admitting: Physical Therapy

## 2018-02-22 DIAGNOSIS — M6281 Muscle weakness (generalized): Secondary | ICD-10-CM

## 2018-02-22 DIAGNOSIS — R262 Difficulty in walking, not elsewhere classified: Secondary | ICD-10-CM

## 2018-02-22 DIAGNOSIS — R2689 Other abnormalities of gait and mobility: Secondary | ICD-10-CM

## 2018-02-22 DIAGNOSIS — F419 Anxiety disorder, unspecified: Secondary | ICD-10-CM

## 2018-02-22 NOTE — Therapy (Signed)
[place], [place], [address] Phone: [phone]   Fax:  [phone]  Physical Therapy Treatment  Patient Details  Name: Stacey Ramirez MRN: [id number] Date of Birth: May 22, 1941 Referring Provider: Shelia Media   Encounter Date: 02/22/2018  PT End of Session - 02/22/18 1432    Visit Number  10    Date for PT Re-Evaluation  03/16/18    PT Start Time  6333    PT Stop Time  1430    PT Time Calculation (min)  45 min    Activity Tolerance  Patient tolerated treatment well    Behavior During Therapy  [facility] for tasks assessed/performed;Anxious       Past Medical History:  Diagnosis Date  . Adjustment reaction with anxiety 12/20/2015  . Aortic stenosis   . CAD (coronary artery disease) 06/09/2017   Nuc study 10/18: EF 69, inferolateral perfusion defect-possible small infarct with peri-infarct ischemia; intermediate risk // LHC 10/18: pLAD 25, pLCx 50, pRCA 25 >> med Rx  . Carotid stenosis, asymptomatic, bilateral 02/27/2016   Carotid US 5/17: Bilateral ICA 1-39  . Cerebrovascular accident (stroke) ([place])    a. 12/2015 - cryptogenic.  S/P MDT Linq.  . Difficulty walking   . Essential hypertension   . Gait disorder 04/07/2016  . Heart palpitations 09/06/2012  . History of CVA with residual deficit 12/14/2015  . History of dizziness   . History of echocardiogram    Echo 10/18: Vigorous LVF, EF 65-70, normal wall motion, grade 1 diastolic dysfunction, GLS -21.1%, mild RAE  . History of nuclear stress test    Nuclear stress test 10/18: EF 69, inf-lat defect c/w poss infarct with peri-infarct ischemia; Intermediate Risk  . Hyperlipidemia   . Hypothyroidism   . Left hemiparesis ([place])   . LVH (left ventricular hypertrophy)   . Mitral regurgitation    a. 12/2015 Echo: EF 60-65%, mild focal basal hypertrophy. No rwma, triv AI, mild MR, mildly dil LA w/o thrombus. No RA thrombus. No PFO.  Marland Kitchen Palpitations   . Prediabetes    . Stroke-like symptom 12/13/2015  . SVT (supraventricular tachycardia) ([place]) 12/14/2015  . Vascular parkinsonism ([place]) 06/29/2016  . Vitamin D deficiency     Past Surgical History:  Procedure Laterality Date  . CARDIOVASCULAR STRESS TEST  2002   NORMAL  . EP IMPLANTABLE DEVICE N/A 12/17/2015   Procedure: Loop Recorder Insertion;  Surgeon: [name], MD;  Location: [place] [facility];  Service: Cardiovascular;  Laterality: N/A;  . LEFT HEART CATH AND CORONARY ANGIOGRAPHY N/A 05/25/2017   Procedure: LEFT HEART CATH AND CORONARY ANGIOGRAPHY;  Surgeon: Sherren Mocha, MD;  Location: [place] [facility];  Service: Cardiovascular;  Laterality: N/A;  . LOOP RECORDER IMPLANT    . TEE WITHOUT CARDIOVERSION N/A 12/17/2015   Procedure: TRANSESOPHAGEAL ECHOCARDIOGRAM (TEE);  Surgeon: Lelon Perla, MD;  Location: [facility] ENDOSCOPY;  Service: Cardiovascular;  Laterality: N/A;  Pt also needs a LOOP  . TRANSTHORACIC ECHOCARDIOGRAM  2008   SHOWED LEFT VENTRICULAR HYPERTROPHY AND MILD AORTIC STENOSIS    There were no vitals filed for this visit.  Subjective Assessment - 02/22/18 1353    Subjective  "Not very good" "Im not walking very good"    Currently in Pain?  No/denies    Pain Score  0-No pain                       [facility] Adult PT  Treatment/Exercise - 02/22/18 0001      Ambulation/Gait   Ambulation/Gait  Yes    Stairs  Yes    Stairs Assistance  5: Supervision    Stair Management Technique  One rail Right;Alternating pattern    Number of Stairs  24    Height of Stairs  6    Gait Comments  worked on gait with increased stride, cadeance ,arm swing and looking up HHA x1 in hall, then without assist      High Level Balance   High Level Balance Activities  Backward walking;Tandem walking tandem HHA x2     High Level Balance Comments  step ups on airex , side steps on foam roll, forward taps on dyna disk      Knee/Hip Exercises: Aerobic   Nustep  L4 x 7 min       Knee/Hip  Exercises: Standing   Other Standing Knee Exercises  LLE hip ext x10 HHA x2               PT Short Term Goals - 01/27/18 1246      PT SHORT TERM GOAL #1   Title  independent with her home HEP    Baseline  balance at counter marching fwd/back,side stepping and tandem    Time  2    Period  Weeks    Status  On-going        PT Long Term Goals - 02/15/18 1433      PT LONG TERM GOAL #1   Title  decrease TUG time to 14 seconds for functional gait and safety    Status  Partially Met      PT LONG TERM GOAL #2   Title  increase Berg balance score to 46/56 for safety    Status  On-going            Plan - 02/22/18 1432    Clinical Impression Statement  Pt did really well with today's activities, posterior lean remains on non compliant surfaces. Pt has some difficulty extending L hip when needing to set LLE back of box or with backwards walking. HHA x2 with all tandem walking activities.     Rehab Potential  Fair    PT Frequency  2x / week    PT Duration  8 weeks    PT Treatment/Interventions  Stair training;Gait training;Functional mobility training;Therapeutic activities;Therapeutic exercise;Balance training;Patient/family education    PT Next Visit Plan  progress with gait confidence and carry over       Patient will benefit from skilled therapeutic intervention in order to improve the following deficits and impairments:  Abnormal gait, Decreased strength, Decreased balance, Difficulty walking, Decreased activity tolerance, Decreased coordination, Decreased mobility  Visit Diagnosis: Muscle weakness (generalized)  Difficulty in walking, not elsewhere classified  Anxiety  Balance problem     Problem List Patient Active Problem List   Diagnosis Date Noted  . Familial hypercholesteremia 02/21/2018  . Psychogenic gait 10/21/2017  . Coronary artery disease involving native coronary artery of native heart without angina pectoris 06/09/2017  . Abnormal nuclear  cardiac imaging test 05/25/2017  . PVC's (premature ventricular contractions) 05/17/2017  . Anxiety 12/29/2016  . Vascular parkinsonism ([place]) 06/29/2016  . Gait disorder 04/07/2016  . Intracranial carotid stenosis, bilateral 02/27/2016  . Essential hypertension   . Left-sided weakness 12/28/2015  . Headache 12/28/2015  . Adjustment reaction with anxiety 12/20/2015  . Left hemiparesis ([place])   . History of CVA with residual deficit   .  Prediabetes   . SVT (supraventricular tachycardia) ([place]) 12/14/2015  . Stroke-like symptom 12/13/2015  . History of stroke   . Heart palpitations 09/06/2012  . Hypertension   . Familial hyperlipidemia   . Hypothyroidism   . Vitamin D deficiency   . Palpitations   . History of dizziness   . LVH (left ventricular hypertrophy)   . Aortic stenosis   . Mitral regurgitation   . SOB (shortness of breath)   . Difficulty walking     Scot Jun, PTA 02/22/2018, 2:35 PM  [place], [place], [address] Phone: [phone]   Fax:  [phone]  Name: MAKINSEY PEPITONE MRN: [id number] Date of Birth: 20-Jan-1941

## 2018-02-23 LAB — CUP PACEART REMOTE DEVICE CHECK
Date Time Interrogation Session: 20190618123832
MDC IDC PG IMPLANT DT: 20170516

## 2018-02-24 ENCOUNTER — Encounter: Payer: Self-pay | Admitting: Physical Therapy

## 2018-02-24 ENCOUNTER — Ambulatory Visit: Payer: PPO | Admitting: Physical Therapy

## 2018-02-24 ENCOUNTER — Ambulatory Visit: Payer: PPO | Admitting: Genetic Counselor

## 2018-02-24 DIAGNOSIS — R262 Difficulty in walking, not elsewhere classified: Secondary | ICD-10-CM

## 2018-02-24 DIAGNOSIS — M6281 Muscle weakness (generalized): Secondary | ICD-10-CM | POA: Diagnosis not present

## 2018-02-24 DIAGNOSIS — R2689 Other abnormalities of gait and mobility: Secondary | ICD-10-CM

## 2018-02-24 DIAGNOSIS — F419 Anxiety disorder, unspecified: Secondary | ICD-10-CM

## 2018-02-24 NOTE — Therapy (Signed)
[place], [place], [address] Phone: [phone]   Fax:  [phone]  Physical Therapy Treatment  Patient Details  Name: Stacey Ramirez MRN: [id number] Date of Birth: [birth year] Referring Provider: Shelia Media   Encounter Date: 02/24/2018  PT End of Session - 02/24/18 1430    Visit Number  11    Date for PT Re-Evaluation  03/16/18    PT Start Time  1345    PT Stop Time  1430    PT Time Calculation (min)  45 min    Activity Tolerance  Patient tolerated treatment well    Behavior During Therapy  [facility] for tasks assessed/performed       Past Medical History:  Diagnosis Date  . Adjustment reaction with anxiety 12/20/2015  . Aortic stenosis   . CAD (coronary artery disease) 06/09/2017   Nuc study 10/18: EF 69, inferolateral perfusion defect-possible small infarct with peri-infarct ischemia; intermediate risk // LHC 10/18: pLAD 25, pLCx 50, pRCA 25 >> med Rx  . Carotid stenosis, asymptomatic, bilateral 02/27/2016   Carotid US 5/17: Bilateral ICA 1-39  . Cerebrovascular accident (stroke) ([place])    a. 12/2015 - cryptogenic.  S/P MDT Linq.  . Difficulty walking   . Essential hypertension   . Gait disorder 04/07/2016  . Heart palpitations 09/06/2012  . History of CVA with residual deficit 12/14/2015  . History of dizziness   . History of echocardiogram    Echo 10/18: Vigorous LVF, EF 65-70, normal wall motion, grade 1 diastolic dysfunction, GLS -21.1%, mild RAE  . History of nuclear stress test    Nuclear stress test 10/18: EF 69, inf-lat defect c/w poss infarct with peri-infarct ischemia; Intermediate Risk  . Hyperlipidemia   . Hypothyroidism   . Left hemiparesis ([place])   . LVH (left ventricular hypertrophy)   . Mitral regurgitation    a. 12/2015 Echo: EF 60-65%, mild focal basal hypertrophy. No rwma, triv AI, mild MR, mildly dil LA w/o thrombus. No RA thrombus. No PFO.  Marland Kitchen Palpitations   . Prediabetes   .  Stroke-like symptom 12/13/2015  . SVT (supraventricular tachycardia) ([place]) 12/14/2015  . Vascular parkinsonism ([place]) 06/29/2016  . Vitamin D deficiency     Past Surgical History:  Procedure Laterality Date  . CARDIOVASCULAR STRESS TEST  2002   NORMAL  . EP IMPLANTABLE DEVICE N/A 12/17/2015   Procedure: Loop Recorder Insertion;  Surgeon: [name], MD;  Location: [place] [facility];  Service: Cardiovascular;  Laterality: N/A;  . LEFT HEART CATH AND CORONARY ANGIOGRAPHY N/A 05/25/2017   Procedure: LEFT HEART CATH AND CORONARY ANGIOGRAPHY;  Surgeon: Sherren Mocha, MD;  Location: [place] [facility];  Service: Cardiovascular;  Laterality: N/A;  . LOOP RECORDER IMPLANT    . TEE WITHOUT CARDIOVERSION N/A 12/17/2015   Procedure: TRANSESOPHAGEAL ECHOCARDIOGRAM (TEE);  Surgeon: Lelon Perla, MD;  Location: [facility] ENDOSCOPY;  Service: Cardiovascular;  Laterality: N/A;  Pt also needs a LOOP  . TRANSTHORACIC ECHOCARDIOGRAM  2008   SHOWED LEFT VENTRICULAR HYPERTROPHY AND MILD AORTIC STENOSIS    There were no vitals filed for this visit.  Subjective Assessment - 02/24/18 1350    Subjective  "All right"    Currently in Pain?  No/denies    Pain Score  0-No pain                       [facility] Adult PT Treatment/Exercise - 02/24/18 0001  Ambulation/Gait   Ambulation/Gait  Yes    Stairs  Yes    Stairs Assistance  5: Supervision    Stair Management Technique  One rail Right;Alternating pattern    Number of Stairs  24    Height of Stairs  6    Gait Comments  worked on gait with increased stride, cadeance ,arm swing and looking up      Knee/Hip Exercises: Aerobic   Nustep  L4 x 7 min       Knee/Hip Exercises: Machines for Strengthening   Cybex Leg Press  30lb 2x10      Knee/Hip Exercises: Standing   Walking with Sports Cord  10lb 4 way x3 each    Other Standing Knee Exercises  standing march x10      Knee/Hip Exercises: Seated   Other Seated Knee/Hip Exercises  Tband  Rows green 2x10     Sit to Sand  1 set;with UE support;10 reps               PT Short Term Goals - 01/27/18 1246      PT SHORT TERM GOAL #1   Title  independent with her home HEP    Baseline  balance at counter marching fwd/back,side stepping and tandem    Time  2    Period  Weeks    Status  On-going        PT Long Term Goals - 02/24/18 1431      PT LONG TERM GOAL #1   Title  decrease TUG time to 14 seconds for functional gait and safety    Status  Partially Met      PT LONG TERM GOAL #2   Title  increase Berg balance score to 46/56 for safety    Status  On-going      PT LONG TERM GOAL #3   Title  increase LE strength (particularly ABD and ext.) to 4/5 for functional gait and safety    Status  Partially Met            Plan - 02/24/18 1433    Clinical Impression Statement  pt did well ambulating in hallway and with stair negotiation. Some instability with resisted gait in all directions. Cues needed for more elevation with standing march.     Rehab Potential  Fair    PT Frequency  2x / week    PT Duration  8 weeks    PT Treatment/Interventions  Stair training;Gait training;Functional mobility training;Therapeutic activities;Therapeutic exercise;Balance training;Patient/family education    PT Next Visit Plan  progress with gait confidence and carry over       Patient will benefit from skilled therapeutic intervention in order to improve the following deficits and impairments:  Abnormal gait, Decreased strength, Decreased balance, Difficulty walking, Decreased activity tolerance, Decreased coordination, Decreased mobility  Visit Diagnosis: Muscle weakness (generalized)  Difficulty in walking, not elsewhere classified  Balance problem  Anxiety     Problem List Patient Active Problem List   Diagnosis Date Noted  . Familial hypercholesteremia 02/21/2018  . Psychogenic gait 10/21/2017  . Coronary artery disease involving native coronary artery of  native heart without angina pectoris 06/09/2017  . Abnormal nuclear cardiac imaging test 05/25/2017  . PVC's (premature ventricular contractions) 05/17/2017  . Anxiety 12/29/2016  . Vascular parkinsonism ([place]) 06/29/2016  . Gait disorder 04/07/2016  . Intracranial carotid stenosis, bilateral 02/27/2016  . Essential hypertension   . Left-sided weakness 12/28/2015  . Headache 12/28/2015  . Adjustment reaction  with anxiety 12/20/2015  . Left hemiparesis ([place])   . History of CVA with residual deficit   . Prediabetes   . SVT (supraventricular tachycardia) ([place]) 12/14/2015  . Stroke-like symptom 12/13/2015  . History of stroke   . Heart palpitations 09/06/2012  . Hypertension   . Familial hyperlipidemia   . Hypothyroidism   . Vitamin D deficiency   . Palpitations   . History of dizziness   . LVH (left ventricular hypertrophy)   . Aortic stenosis   . Mitral regurgitation   . SOB (shortness of breath)   . Difficulty walking     Scot Jun, PTA 02/24/2018, 2:36 PM  [place], [place], [address] Phone: [phone]   Fax:  [phone]  Name: WILLADENE MOUNSEY MRN: [id number] Date of Birth: 14-May-1941

## 2018-03-01 ENCOUNTER — Ambulatory Visit: Payer: PPO | Admitting: Physical Therapy

## 2018-03-01 DIAGNOSIS — R262 Difficulty in walking, not elsewhere classified: Secondary | ICD-10-CM

## 2018-03-01 DIAGNOSIS — M6281 Muscle weakness (generalized): Secondary | ICD-10-CM | POA: Diagnosis not present

## 2018-03-01 DIAGNOSIS — R2689 Other abnormalities of gait and mobility: Secondary | ICD-10-CM

## 2018-03-01 NOTE — Therapy (Signed)
[place], [place], [address] Phone: [phone]   Fax:  [phone]  Physical Therapy Treatment  Patient Details  Name: Stacey Ramirez MRN: [id number] Date of Birth: 11/20/1940 Referring Provider: Shelia Media   Encounter Date: 03/01/2018  PT End of Session - 03/01/18 1405    Visit Number  12    Date for PT Re-Evaluation  03/16/18    PT Start Time  1400    PT Stop Time  1448    PT Time Calculation (min)  48 min       Past Medical History:  Diagnosis Date  . Adjustment reaction with anxiety 12/20/2015  . Aortic stenosis   . CAD (coronary artery disease) 06/09/2017   Nuc study 10/18: EF 69, inferolateral perfusion defect-possible small infarct with peri-infarct ischemia; intermediate risk // LHC 10/18: pLAD 25, pLCx 50, pRCA 25 >> med Rx  . Carotid stenosis, asymptomatic, bilateral 02/27/2016   Carotid US 5/17: Bilateral ICA 1-39  . Cerebrovascular accident (stroke) ([place])    a. 12/2015 - cryptogenic.  S/P MDT Linq.  . Difficulty walking   . Essential hypertension   . Gait disorder 04/07/2016  . Heart palpitations 09/06/2012  . History of CVA with residual deficit 12/14/2015  . History of dizziness   . History of echocardiogram    Echo 10/18: Vigorous LVF, EF 65-70, normal wall motion, grade 1 diastolic dysfunction, GLS -21.1%, mild RAE  . History of nuclear stress test    Nuclear stress test 10/18: EF 69, inf-lat defect c/w poss infarct with peri-infarct ischemia; Intermediate Risk  . Hyperlipidemia   . Hypothyroidism   . Left hemiparesis ([place])   . LVH (left ventricular hypertrophy)   . Mitral regurgitation    a. 12/2015 Echo: EF 60-65%, mild focal basal hypertrophy. No rwma, triv AI, mild MR, mildly dil LA w/o thrombus. No RA thrombus. No PFO.  Marland Kitchen Palpitations   . Prediabetes   . Stroke-like symptom 12/13/2015  . SVT (supraventricular tachycardia) ([place]) 12/14/2015  . Vascular parkinsonism ([place])  06/29/2016  . Vitamin D deficiency     Past Surgical History:  Procedure Laterality Date  . CARDIOVASCULAR STRESS TEST  2002   NORMAL  . EP IMPLANTABLE DEVICE N/A 12/17/2015   Procedure: Loop Recorder Insertion;  Surgeon: [name], MD;  Location: [place] [facility];  Service: Cardiovascular;  Laterality: N/A;  . LEFT HEART CATH AND CORONARY ANGIOGRAPHY N/A 05/25/2017   Procedure: LEFT HEART CATH AND CORONARY ANGIOGRAPHY;  Surgeon: Sherren Mocha, MD;  Location: [place] [facility];  Service: Cardiovascular;  Laterality: N/A;  . LOOP RECORDER IMPLANT    . TEE WITHOUT CARDIOVERSION N/A 12/17/2015   Procedure: TRANSESOPHAGEAL ECHOCARDIOGRAM (TEE);  Surgeon: Lelon Perla, MD;  Location[facility];  Service: Cardiovascular;  Laterality: N/A;  Pt also needs a LOOP  . TRANSTHORACIC ECHOCARDIOGRAM  2008   SHOWED LEFT VENTRICULAR HYPERTROPHY AND MILD AORTIC STENOSIS    There were no vitals filed for this visit.  Subjective Assessment - 03/01/18 1324    Subjective  I have been working hard at home    Currently in Pain?  No/denies                       [facility] Adult PT Treatment/Exercise - 03/01/18 0001      Ambulation/Gait   Gait Comments  worked on gait with increased stride/cadeance and turns- cuing needed  High Level Balance   High Level Balance Activities  Negotitating around obstacles;Negotiating over obstacles;Marching forwards;Marching backwards vector stepping      Knee/Hip Exercises: Aerobic   Elliptical  3 min I 6 R 3    Tread Mill  1 mph 3 min      Knee/Hip Exercises: Machines for Strengthening   Cybex Knee Extension  10lbs 2x10 LLE 5lb 1x10    Cybex Knee Flexion  20# 2 sets 10; 10lb LLE 2x10       Knee/Hip Exercises: Standing   Walking with Sports Cord  resisted gait with step up min A with LOB               PT Short Term Goals - 01/27/18 1246      PT SHORT TERM GOAL #1   Title  independent with her home HEP    Baseline  balance  at counter marching fwd/back,side stepping and tandem    Time  2    Period  Weeks    Status  On-going        PT Long Term Goals - 02/24/18 1431      PT LONG TERM GOAL #1   Title  decrease TUG time to 14 seconds for functional gait and safety    Status  Partially Met      PT LONG TERM GOAL #2   Title  increase Berg balance score to 46/56 for safety    Status  On-going      PT LONG TERM GOAL #3   Title  increase LE strength (particularly ABD and ext.) to 4/5 for functional gait and safety    Status  Partially Met            Plan - 03/01/18 1407    Clinical Impression Statement  focus session in gait and balance with neg obstacles,increasing cadeance, stride and turns- cuing needed and assistance at times with posterior LOB. Pt tolerated addition of TM and elliptical for increased stride and endurance.Noted some improved carryover from last session    PT Treatment/Interventions  Stair training;Gait training;Functional mobility training;Therapeutic activities;Therapeutic exercise;Balance training;Patient/family education    PT Next Visit Plan  progress with gait confidence and carry over- assess goals       Patient will benefit from skilled therapeutic intervention in order to improve the following deficits and impairments:  Abnormal gait, Decreased strength, Decreased balance, Difficulty walking, Decreased activity tolerance, Decreased coordination, Decreased mobility  Visit Diagnosis: Muscle weakness (generalized)  Difficulty in walking, not elsewhere classified  Balance problem     Problem List Patient Active Problem List   Diagnosis Date Noted  . Familial hypercholesteremia 02/21/2018  . Psychogenic gait 10/21/2017  . Coronary artery disease involving native coronary artery of native heart without angina pectoris 06/09/2017  . Abnormal nuclear cardiac imaging test 05/25/2017  . PVC's (premature ventricular contractions) 05/17/2017  . Anxiety 12/29/2016  .  Vascular parkinsonism ([place]) 06/29/2016  . Gait disorder 04/07/2016  . Intracranial carotid stenosis, bilateral 02/27/2016  . Essential hypertension   . Left-sided weakness 12/28/2015  . Headache 12/28/2015  . Adjustment reaction with anxiety 12/20/2015  . Left hemiparesis ([place])   . History of CVA with residual deficit   . Prediabetes   . SVT (supraventricular tachycardia) ([place]) 12/14/2015  . Stroke-like symptom 12/13/2015  . History of stroke   . Heart palpitations 09/06/2012  . Hypertension   . Familial hyperlipidemia   . Hypothyroidism   . Vitamin D deficiency   . Palpitations   .  History of dizziness   . LVH (left ventricular hypertrophy)   . Aortic stenosis   . Mitral regurgitation   . SOB (shortness of breath)   . Difficulty walking     PAYSEUR,ANGIE PTA 03/01/2018, 2:10 PM  [place], [place], [address] Phone: [phone]   Fax:  [phone]  Name: JANIAYA RYSER MRN: [id number] Date of Birth: 1941/02/06

## 2018-03-03 ENCOUNTER — Ambulatory Visit: Payer: PPO | Attending: Internal Medicine | Admitting: Physical Therapy

## 2018-03-03 DIAGNOSIS — R2689 Other abnormalities of gait and mobility: Secondary | ICD-10-CM | POA: Diagnosis not present

## 2018-03-03 DIAGNOSIS — G8194 Hemiplegia, unspecified affecting left nondominant side: Secondary | ICD-10-CM | POA: Diagnosis not present

## 2018-03-03 DIAGNOSIS — I63411 Cerebral infarction due to embolism of right middle cerebral artery: Secondary | ICD-10-CM | POA: Diagnosis not present

## 2018-03-03 DIAGNOSIS — M6281 Muscle weakness (generalized): Secondary | ICD-10-CM | POA: Diagnosis not present

## 2018-03-03 DIAGNOSIS — R262 Difficulty in walking, not elsewhere classified: Secondary | ICD-10-CM | POA: Diagnosis not present

## 2018-03-03 DIAGNOSIS — F419 Anxiety disorder, unspecified: Secondary | ICD-10-CM | POA: Diagnosis not present

## 2018-03-03 NOTE — Progress Notes (Signed)
Post Test Genetic Consult   Stacey Ramirez is here today with her husband, [name] for her post-test genetic consult for FH. We reviewed her pedigree to determine changes in medical history since we last met. She noted that there have been no changes.   I informed her that her genetic test did not yield a pathogenic variant. She was disappointed to hear this. I reiterated that a positive yield is observed in  ~63% of patients with a definite clinical diagnosis of FH.   I explained to her that there are two likely reasons for having a negative test- One possibility is that her FH is polygenic and the other possibility is that she may harbor a large gene deletion in the LDL-R gene that will not be detected by DNA sequencing.   I told her that it has been reported that some individuals that tested negative for mutations in APOB, LDL-R and PCSK9 were found to have a large number of changes in their genome, each contributing to a small increase in LDL-C that cumulatively lead to an increase in LDL-C above the diagnostic cut-off. This is attributed to the polygenic inheritance seen in some with elevated LDL-C levels.  Additionally, large deletions in the gene for the LDL receptor have been noted in 2.5% to10% of individuals that test negative in their FH genetic test. These deletions encompass different sections of the LDL-R gene. Upon reviewing her raw data sequence files, it was noted that she has heterozygous common SNPs in the 3' end of the gene indicating that there is no deletion in this region of the gene. It is possible that she may carry a large deletion in the promoter and first exon of the LDL-R gene which can explain her non-responsiveness to statins and Repatha as a deletion in this region would lead to loss of LDL-R protein in one allele.    I believe she has a basic understanding of her test result. I gave her a copy of her pedigree and test report and asked her to reach out to me if she had any  further questions.  Lattie Corns, Ph.D, [facility] Clinical Molecular Geneticist

## 2018-03-03 NOTE — Therapy (Signed)
[place], [place], [address] Phone: [phone]   Fax:  [phone]  Physical Therapy Treatment  Patient Details  Name: Stacey Ramirez MRN: [id number] Date of Birth: 09-15-1940 Referring Provider: Shelia Media   Encounter Date: 03/03/2018  PT End of Session - 03/03/18 1519    Visit Number  13    Date for PT Re-Evaluation  03/16/18    PT Start Time  1400    PT Stop Time  1447    PT Time Calculation (min)  47 min    Activity Tolerance  Patient tolerated treatment well    Behavior During Therapy  [facility] for tasks assessed/performed       Past Medical History:  Diagnosis Date  . Adjustment reaction with anxiety 12/20/2015  . Aortic stenosis   . CAD (coronary artery disease) 06/09/2017   Nuc study 10/18: EF 69, inferolateral perfusion defect-possible small infarct with peri-infarct ischemia; intermediate risk // LHC 10/18: pLAD 25, pLCx 50, pRCA 25 >> med Rx  . Carotid stenosis, asymptomatic, bilateral 02/27/2016   Carotid US 5/17: Bilateral ICA 1-39  . Cerebrovascular accident (stroke) ([place])    a. 12/2015 - cryptogenic.  S/P MDT Linq.  . Difficulty walking   . Essential hypertension   . Gait disorder 04/07/2016  . Heart palpitations 09/06/2012  . History of CVA with residual deficit 12/14/2015  . History of dizziness   . History of echocardiogram    Echo 10/18: Vigorous LVF, EF 65-70, normal wall motion, grade 1 diastolic dysfunction, GLS -21.1%, mild RAE  . History of nuclear stress test    Nuclear stress test 10/18: EF 69, inf-lat defect c/w poss infarct with peri-infarct ischemia; Intermediate Risk  . Hyperlipidemia   . Hypothyroidism   . Left hemiparesis ([place])   . LVH (left ventricular hypertrophy)   . Mitral regurgitation    a. 12/2015 Echo: EF 60-65%, mild focal basal hypertrophy. No rwma, triv AI, mild MR, mildly dil LA w/o thrombus. No RA thrombus. No PFO.  Marland Kitchen Palpitations   . Prediabetes   .  Stroke-like symptom 12/13/2015  . SVT (supraventricular tachycardia) ([place]) 12/14/2015  . Vascular parkinsonism ([place]) 06/29/2016  . Vitamin D deficiency     Past Surgical History:  Procedure Laterality Date  . CARDIOVASCULAR STRESS TEST  2002   NORMAL  . EP IMPLANTABLE DEVICE N/A 12/17/2015   Procedure: Loop Recorder Insertion;  Surgeon: [name], MD;  Location: [place] [facility];  Service: Cardiovascular;  Laterality: N/A;  . LEFT HEART CATH AND CORONARY ANGIOGRAPHY N/A 05/25/2017   Procedure: LEFT HEART CATH AND CORONARY ANGIOGRAPHY;  Surgeon: Sherren Mocha, MD;  Location: [place] [facility];  Service: Cardiovascular;  Laterality: N/A;  . LOOP RECORDER IMPLANT    . TEE WITHOUT CARDIOVERSION N/A 12/17/2015   Procedure: TRANSESOPHAGEAL ECHOCARDIOGRAM (TEE);  Surgeon: Lelon Perla, MD;  Location: [facility] ENDOSCOPY;  Service: Cardiovascular;  Laterality: N/A;  Pt also needs a LOOP  . TRANSTHORACIC ECHOCARDIOGRAM  2008   SHOWED LEFT VENTRICULAR HYPERTROPHY AND MILD AORTIC STENOSIS    There were no vitals filed for this visit.  Subjective Assessment - 03/03/18 1407    Subjective  Pt reports having a hard time getting started today and wanting to rest a little more.     Currently in Pain?  No/denies                       [facility] Adult  PT Treatment/Exercise - 03/03/18 0001      Berg Balance Test   Sit to Stand  Able to stand without using hands and stabilize independently    Standing Unsupported  Able to stand safely 2 minutes    Sitting with Back Unsupported but Feet Supported on Floor or Stool  Able to sit safely and securely 2 minutes    Stand to Sit  Sits safely with minimal use of hands    Transfers  Able to transfer safely, minor use of hands    Standing Unsupported with Eyes Closed  Able to stand 10 seconds safely    Standing Ubsupported with Feet Together  Able to place feet together independently and stand 1 minute safely    From Standing, Reach Forward with  Outstretched Arm  Can reach confidently >25 cm (10")    From Standing Position, Pick up Object from Floor  Able to pick up shoe safely and easily    From Standing Position, Turn to Look Behind Over each Shoulder  Looks behind from both sides and weight shifts well    Turn 360 Degrees  Able to turn 360 degrees safely one side only in 4 seconds or less    Standing Unsupported, Alternately Place Feet on Step/Stool  Able to stand independently and complete 8 steps >20 seconds    Standing Unsupported, One Foot in [employer] balance while stepping or standing    Standing on One Leg  Able to lift leg independently and hold 5-10 seconds    Total Score  49      High Level Balance   High Level Balance Activities  Negotitating around obstacles;Negotiating over obstacles;Marching forwards;Marching backwards Agility ladder      Knee/Hip Exercises: Aerobic   Elliptical  4 min I 6 R3    Tread Mill  1 mph 3 min      Knee/Hip Exercises: Machines for Strengthening   Cybex Knee Extension  10lbs 2x10 LLE 5lb 1x10    Cybex Knee Flexion  20# 2 sets 10; 10lb LLE 2x10                PT Short Term Goals - 01/27/18 1246      PT SHORT TERM GOAL #1   Title  independent with her home HEP    Baseline  balance at counter marching fwd/back,side stepping and tandem    Time  2    Period  Weeks    Status  On-going        PT Long Term Goals - 03/03/18 1434      PT LONG TERM GOAL #2   Title  increase Berg balance score to 46/56 for safety    Status  Achieved            Plan - 03/03/18 1508    Clinical Impression Statement  Pt increased BERG score to 49 points. Pt was able to complete agility ladder with reciprocal gait and no assitance. Pt requires verbal and tactile cues to stand up straight to normalize posture. Pt tolerated interventions well. PTA discussed joining maintenance program. Pt maintained improvement on elliptical and treadmill without increased fatigue.     Rehab Potential  Fair     PT Frequency  2x / week    PT Duration  8 weeks    PT Treatment/Interventions  Stair training;Gait training;Functional mobility training;Therapeutic activities;Therapeutic exercise;Balance training;Patient/family education    PT Next Visit Plan  progress with gait confidence and carry over- assess goals  PT Home Exercise Plan  hip flex, ABD, ext. in standing with walker or kitchen counter       Patient will benefit from skilled therapeutic intervention in order to improve the following deficits and impairments:  Abnormal gait, Decreased strength, Decreased balance, Difficulty walking, Decreased activity tolerance, Decreased coordination, Decreased mobility  Visit Diagnosis: Muscle weakness (generalized)  Difficulty in walking, not elsewhere classified  Balance problem  Anxiety  Stroke due to embolism of right middle cerebral artery (HCC)  Left hemiparesis ([place])     Problem List Patient Active Problem List   Diagnosis Date Noted  . Familial hypercholesteremia 02/21/2018  . Psychogenic gait 10/21/2017  . Coronary artery disease involving native coronary artery of native heart without angina pectoris 06/09/2017  . Abnormal nuclear cardiac imaging test 05/25/2017  . PVC's (premature ventricular contractions) 05/17/2017  . Anxiety 12/29/2016  . Vascular parkinsonism ([place]) 06/29/2016  . Gait disorder 04/07/2016  . Intracranial carotid stenosis, bilateral 02/27/2016  . Essential hypertension   . Left-sided weakness 12/28/2015  . Headache 12/28/2015  . Adjustment reaction with anxiety 12/20/2015  . Left hemiparesis ([place])   . History of CVA with residual deficit   . Prediabetes   . SVT (supraventricular tachycardia) ([place]) 12/14/2015  . Stroke-like symptom 12/13/2015  . History of stroke   . Heart palpitations 09/06/2012  . Hypertension   . Familial hyperlipidemia   . Hypothyroidism   . Vitamin D deficiency   . Palpitations   . History of dizziness   . LVH (left  ventricular hypertrophy)   . Aortic stenosis   . Mitral regurgitation   . SOB (shortness of breath)   . Difficulty walking     Maryelizabeth Kaufmann, [place] 03/03/2018, 3:21 PM  [place], [place], [address] Phone: [phone]   Fax:  [phone]  Name: Stacey Ramirez MRN: [id number] Date of Birth: [birth year]

## 2018-03-08 ENCOUNTER — Encounter: Payer: Self-pay | Admitting: Physical Therapy

## 2018-03-08 ENCOUNTER — Ambulatory Visit: Payer: PPO | Admitting: Physical Therapy

## 2018-03-08 DIAGNOSIS — R262 Difficulty in walking, not elsewhere classified: Secondary | ICD-10-CM

## 2018-03-08 DIAGNOSIS — R29898 Other symptoms and signs involving the musculoskeletal system: Secondary | ICD-10-CM | POA: Diagnosis not present

## 2018-03-08 DIAGNOSIS — R2689 Other abnormalities of gait and mobility: Secondary | ICD-10-CM

## 2018-03-08 DIAGNOSIS — M6281 Muscle weakness (generalized): Secondary | ICD-10-CM | POA: Diagnosis not present

## 2018-03-08 DIAGNOSIS — R269 Unspecified abnormalities of gait and mobility: Secondary | ICD-10-CM | POA: Diagnosis not present

## 2018-03-08 NOTE — Therapy (Signed)
[place], [place], [address] Phone: [phone]   Fax:  [phone]  Physical Therapy Treatment  Patient Details  Name: Stacey Ramirez MRN: [id number] Date of Birth: [birth year] Referring Provider: Shelia Media   Encounter Date: 03/08/2018  PT End of Session - 03/08/18 1442    Date for PT Re-Evaluation  04/15/18       Past Medical History:  Diagnosis Date  . Adjustment reaction with anxiety 12/20/2015  . Aortic stenosis   . CAD (coronary artery disease) 06/09/2017   Nuc study 10/18: EF 69, inferolateral perfusion defect-possible small infarct with peri-infarct ischemia; intermediate risk // LHC 10/18: pLAD 25, pLCx 50, pRCA 25 >> med Rx  . Carotid stenosis, asymptomatic, bilateral 02/27/2016   Carotid US 5/17: Bilateral ICA 1-39  . Cerebrovascular accident (stroke) ([place])    a. 12/2015 - cryptogenic.  S/P MDT Linq.  . Difficulty walking   . Essential hypertension   . Gait disorder 04/07/2016  . Heart palpitations 09/06/2012  . History of CVA with residual deficit 12/14/2015  . History of dizziness   . History of echocardiogram    Echo 10/18: Vigorous LVF, EF 65-70, normal wall motion, grade 1 diastolic dysfunction, GLS -21.1%, mild RAE  . History of nuclear stress test    Nuclear stress test 10/18: EF 69, inf-lat defect c/w poss infarct with peri-infarct ischemia; Intermediate Risk  . Hyperlipidemia   . Hypothyroidism   . Left hemiparesis ([place])   . LVH (left ventricular hypertrophy)   . Mitral regurgitation    a. 12/2015 Echo: EF 60-65%, mild focal basal hypertrophy. No rwma, triv AI, mild MR, mildly dil LA w/o thrombus. No RA thrombus. No PFO.  Marland Kitchen Palpitations   . Prediabetes   . Stroke-like symptom 12/13/2015  . SVT (supraventricular tachycardia) ([place]) 12/14/2015  . Vascular parkinsonism ([place]) 06/29/2016  . Vitamin D deficiency     Past Surgical History:  Procedure Laterality Date  . CARDIOVASCULAR  STRESS TEST  2002   NORMAL  . EP IMPLANTABLE DEVICE N/A 12/17/2015   Procedure: Loop Recorder Insertion;  Surgeon: [name], MD;  Location: [place] [facility];  Service: Cardiovascular;  Laterality: N/A;  . LEFT HEART CATH AND CORONARY ANGIOGRAPHY N/A 05/25/2017   Procedure: LEFT HEART CATH AND CORONARY ANGIOGRAPHY;  Surgeon: Sherren Mocha, MD;  Location: [place] [facility];  Service: Cardiovascular;  Laterality: N/A;  . LOOP RECORDER IMPLANT    . TEE WITHOUT CARDIOVERSION N/A 12/17/2015   Procedure: TRANSESOPHAGEAL ECHOCARDIOGRAM (TEE);  Surgeon: Lelon Perla, MD;  Location: [facility] ENDOSCOPY;  Service: Cardiovascular;  Laterality: N/A;  Pt also needs a LOOP  . TRANSTHORACIC ECHOCARDIOGRAM  2008   SHOWED LEFT VENTRICULAR HYPERTROPHY AND MILD AORTIC STENOSIS    There were no vitals filed for this visit.  Subjective Assessment - 03/08/18 1406    Subjective  saw new neuro MD and they are going to do some testing and they are thinking Parkinson. Left leg just stops at times and shakes. I am exhausted today.    Currently in Pain?  No/denies                       [facility] Adult PT Treatment/Exercise - 03/08/18 0001      High Level Balance   High Level Balance Activities  Negotiating over obstacles;Negotitating around obstacles ball toss on airex    High Level Balance Comments  recricopal gait act. to increase  stride/cadeance      Knee/Hip Exercises: Aerobic   Nustep  L 5 7 min      Knee/Hip Exercises: Machines for Strengthening   Cybex Knee Extension  10lbs 2x10 LLE 5lb 1x10    Cybex Knee Flexion  20# 2 sets 10; 10lb LLE 2x10                PT Short Term Goals - 01/27/18 1246      PT SHORT TERM GOAL #1   Title  independent with her home HEP    Baseline  balance at counter marching fwd/back,side stepping and tandem    Time  2    Period  Weeks    Status  On-going        PT Long Term Goals - 03/08/18 1413      PT LONG TERM GOAL #1   Title  decrease  TUG time to 14 seconds for functional gait and safety    Status  Achieved      PT LONG TERM GOAL #2   Title  increase Berg balance score to 46/56 for safety    Status  Achieved      PT LONG TERM GOAL #3   Title  increase LE strength (particularly ABD and ext.) to 4/5 for functional gait and safety    Status  Achieved      PT LONG TERM GOAL #4   Title  be able to do a sit to stand without assistance and without losing balance for functional use    Status  Partially Met            Plan - 03/08/18 1414    Clinical Impression Statement  all goals met except last goal partially met. pt very fatigued today from running to MD appts so increased rest needed    PT Treatment/Interventions  Stair training;Gait training;Functional mobility training;Therapeutic activities;Therapeutic exercise;Balance training;Patient/family education    PT Next Visit Plan  next sessions educ pt and caregiver for transition to independant gym program- Renew 1 x wk for 1 month.        Patient will benefit from skilled therapeutic intervention in order to improve the following deficits and impairments:  Abnormal gait, Decreased strength, Decreased balance, Difficulty walking, Decreased activity tolerance, Decreased coordination, Decreased mobility  Visit Diagnosis: Muscle weakness (generalized)  Difficulty in walking, not elsewhere classified  Balance problem     Problem List Patient Active Problem List   Diagnosis Date Noted  . Familial hypercholesteremia 02/21/2018  . Psychogenic gait 10/21/2017  . Coronary artery disease involving native coronary artery of native heart without angina pectoris 06/09/2017  . Abnormal nuclear cardiac imaging test 05/25/2017  . PVC's (premature ventricular contractions) 05/17/2017  . Anxiety 12/29/2016  . Vascular parkinsonism ([employer]) 06/29/2016  . Gait disorder 04/07/2016  . Intracranial carotid stenosis, bilateral 02/27/2016  . Essential hypertension   .  Left-sided weakness 12/28/2015  . Headache 12/28/2015  . Adjustment reaction with anxiety 12/20/2015  . Left hemiparesis ([place])   . History of CVA with residual deficit   . Prediabetes   . SVT (supraventricular tachycardia) ([place]) 12/14/2015  . Stroke-like symptom 12/13/2015  . History of stroke   . Heart palpitations 09/06/2012  . Hypertension   . Familial hyperlipidemia   . Hypothyroidism   . Vitamin D deficiency   . Palpitations   . History of dizziness   . LVH (left ventricular hypertrophy)   . Aortic stenosis   . Mitral regurgitation   .  SOB (shortness of breath)   . Difficulty walking     Jocilynn Grade,ANGIE PTA 03/08/2018, 2:43 PM  [place] [address] [address] [place], [place], [address] Phone: [phone]   Fax:  [phone]  Name: Stacey Ramirez MRN: [id number] Date of Birth: [birth year]

## 2018-03-08 NOTE — Addendum Note (Signed)
Addended by: Sumner Boast on: 03/08/2018 05:03 PM   Modules accepted: Orders

## 2018-03-10 ENCOUNTER — Ambulatory Visit: Payer: PPO | Admitting: Physical Therapy

## 2018-03-16 ENCOUNTER — Encounter: Payer: Self-pay | Admitting: Physical Therapy

## 2018-03-16 ENCOUNTER — Ambulatory Visit: Payer: PPO | Admitting: Physical Therapy

## 2018-03-16 DIAGNOSIS — M6281 Muscle weakness (generalized): Secondary | ICD-10-CM | POA: Diagnosis not present

## 2018-03-16 DIAGNOSIS — R262 Difficulty in walking, not elsewhere classified: Secondary | ICD-10-CM

## 2018-03-16 DIAGNOSIS — R2689 Other abnormalities of gait and mobility: Secondary | ICD-10-CM

## 2018-03-16 NOTE — Therapy (Signed)
[place], [place], [address] Phone: [phone]   Fax:  [phone]  Physical Therapy Treatment  Patient Details  Name: Stacey Ramirez MRN: [id number] Date of Birth: 01-09-1941 Referring Provider: Shelia Media   Encounter Date: 03/16/2018  PT End of Session - 03/16/18 1518    Visit Number  15    Date for PT Re-Evaluation  04/15/18    PT Start Time  1518    PT Stop Time  1603    PT Time Calculation (min)  45 min    Activity Tolerance  Patient tolerated treatment well       Past Medical History:  Diagnosis Date  . Adjustment reaction with anxiety 12/20/2015  . Aortic stenosis   . CAD (coronary artery disease) 06/09/2017   Nuc study 10/18: EF 69, inferolateral perfusion defect-possible small infarct with peri-infarct ischemia; intermediate risk // LHC 10/18: pLAD 25, pLCx 50, pRCA 25 >> med Rx  . Carotid stenosis, asymptomatic, bilateral 02/27/2016   Carotid US 5/17: Bilateral ICA 1-39  . Cerebrovascular accident (stroke) ([place])    a. 12/2015 - cryptogenic.  S/P MDT Linq.  . Difficulty walking   . Essential hypertension   . Gait disorder 04/07/2016  . Heart palpitations 09/06/2012  . History of CVA with residual deficit 12/14/2015  . History of dizziness   . History of echocardiogram    Echo 10/18: Vigorous LVF, EF 65-70, normal wall motion, grade 1 diastolic dysfunction, GLS -21.1%, mild RAE  . History of nuclear stress test    Nuclear stress test 10/18: EF 69, inf-lat defect c/w poss infarct with peri-infarct ischemia; Intermediate Risk  . Hyperlipidemia   . Hypothyroidism   . Left hemiparesis ([place])   . LVH (left ventricular hypertrophy)   . Mitral regurgitation    a. 12/2015 Echo: EF 60-65%, mild focal basal hypertrophy. No rwma, triv AI, mild MR, mildly dil LA w/o thrombus. No RA thrombus. No PFO.  Marland Kitchen Palpitations   . Prediabetes   . Stroke-like symptom 12/13/2015  . SVT (supraventricular  tachycardia) ([place]) 12/14/2015  . Vascular parkinsonism ([place]) 06/29/2016  . Vitamin D deficiency     Past Surgical History:  Procedure Laterality Date  . CARDIOVASCULAR STRESS TEST  2002   NORMAL  . EP IMPLANTABLE DEVICE N/A 12/17/2015   Procedure: Loop Recorder Insertion;  Surgeon: [name], MD;  Location: [place] [facility];  Service: Cardiovascular;  Laterality: N/A;  . LEFT HEART CATH AND CORONARY ANGIOGRAPHY N/A 05/25/2017   Procedure: LEFT HEART CATH AND CORONARY ANGIOGRAPHY;  Surgeon: Sherren Mocha, MD;  Location: [place] [facility];  Service: Cardiovascular;  Laterality: N/A;  . LOOP RECORDER IMPLANT    . TEE WITHOUT CARDIOVERSION N/A 12/17/2015   Procedure: TRANSESOPHAGEAL ECHOCARDIOGRAM (TEE);  Surgeon: Lelon Perla, MD;  Location: [facility] ENDOSCOPY;  Service: Cardiovascular;  Laterality: N/A;  Pt also needs a LOOP  . TRANSTHORACIC ECHOCARDIOGRAM  2008   SHOWED LEFT VENTRICULAR HYPERTROPHY AND MILD AORTIC STENOSIS    There were no vitals filed for this visit.  Subjective Assessment - 03/16/18 1519    Subjective  Pt reports she is tired after spending two days unpacking from being at the beach.     Patient Stated Goals  being able to walk without being fearful and without having to use someone or an assistive device for stability    Currently in Pain?  No/denies  [place] Adult PT Treatment/Exercise - 03/16/18 0001      High Level Balance   High Level Balance Comments  standing tandem at the counter 1' each leg in front with PRN UE assist, marching in place with pt hands resting lightly on therapists.       Neuro Re-ed    Neuro Re-ed Details   standing UBE L1x4' alt FWD/BWD, standing with one foot on 6" step and overhead reaching with close supervision.  Required two episodes of LOB with Rt LE on step, requiring assist.       Exercises   Other Exercises   leaning on bolster against wall , scap retraction red band,       Knee/Hip  Exercises: Standing   Other Standing Knee Exercises  in high kneel at edge of table with side stepping each direction, required hand held assist      Ankle Exercises: Stretches   Other Stretch  2x30 sec hip flexor stretches seated.              PT Education - 03/16/18 1552    Education provided  Yes    Education Details  seated hip flexor stetches    Methods  Explanation;Demonstration;Handout    Comprehension  Returned demonstration;Verbalized understanding       PT Short Term Goals - 01/27/18 1246      PT SHORT TERM GOAL #1   Title  independent with her home HEP    Baseline  balance at counter marching fwd/back,side stepping and tandem    Time  2    Period  Weeks    Status  On-going        PT Long Term Goals - 03/08/18 1413      PT LONG TERM GOAL #1   Title  decrease TUG time to 14 seconds for functional gait and safety    Status  Achieved      PT LONG TERM GOAL #2   Title  increase Berg balance score to 46/56 for safety    Status  Achieved      PT LONG TERM GOAL #3   Title  increase LE strength (particularly ABD and ext.) to 4/5 for functional gait and safety    Status  Achieved      PT LONG TERM GOAL #4   Title  be able to do a sit to stand without assistance and without losing balance for functional use    Status  Partially Met            Plan - 03/16/18 1603    Clinical Impression Statement  Pt able to perform different exercise to work her balance with some fatigue. She was able to maintain a more upright posture after VC's with carry over and self correction throughout session.      Rehab Potential  Fair    PT Frequency  2x / week    PT Treatment/Interventions  Stair training;Gait training;Functional mobility training;Therapeutic activities;Therapeutic exercise;Balance training;Patient/family education    PT Next Visit Plan  next sessions educ pt and caregiver for transition to independant gym program- Renew 1 x wk for 1 month.        Patient  will benefit from skilled therapeutic intervention in order to improve the following deficits and impairments:  Abnormal gait, Decreased strength, Decreased balance, Difficulty walking, Decreased activity tolerance, Decreased coordination, Decreased mobility  Visit Diagnosis: Muscle weakness (generalized)  Difficulty in walking, not elsewhere classified  Balance problem     Problem  List Patient Active Problem List   Diagnosis Date Noted  . Familial hypercholesteremia 02/21/2018  . Psychogenic gait 10/21/2017  . Coronary artery disease involving native coronary artery of native heart without angina pectoris 06/09/2017  . Abnormal nuclear cardiac imaging test 05/25/2017  . PVC's (premature ventricular contractions) 05/17/2017  . Anxiety 12/29/2016  . Vascular parkinsonism ([place]) 06/29/2016  . Gait disorder 04/07/2016  . Intracranial carotid stenosis, bilateral 02/27/2016  . Essential hypertension   . Left-sided weakness 12/28/2015  . Headache 12/28/2015  . Adjustment reaction with anxiety 12/20/2015  . Left hemiparesis ([place])   . History of CVA with residual deficit   . Prediabetes   . SVT (supraventricular tachycardia) ([place]) 12/14/2015  . Stroke-like symptom 12/13/2015  . History of stroke   . Heart palpitations 09/06/2012  . Hypertension   . Familial hyperlipidemia   . Hypothyroidism   . Vitamin D deficiency   . Palpitations   . History of dizziness   . LVH (left ventricular hypertrophy)   . Aortic stenosis   . Mitral regurgitation   . SOB (shortness of breath)   . Difficulty walking     Jeral Pinch PT  03/16/2018, 4:05 PM  [place] [address] [place], [place], [address] Phone: [phone]   Fax:  [phone]  Name: Stacey Ramirez MRN: [id number] Date of Birth: 1941/03/16

## 2018-03-17 DIAGNOSIS — R0989 Other specified symptoms and signs involving the circulatory and respiratory systems: Secondary | ICD-10-CM | POA: Diagnosis not present

## 2018-03-17 DIAGNOSIS — I10 Essential (primary) hypertension: Secondary | ICD-10-CM | POA: Diagnosis not present

## 2018-03-22 ENCOUNTER — Ambulatory Visit: Payer: PPO | Admitting: Physical Therapy

## 2018-03-22 DIAGNOSIS — R2689 Other abnormalities of gait and mobility: Secondary | ICD-10-CM

## 2018-03-22 DIAGNOSIS — R262 Difficulty in walking, not elsewhere classified: Secondary | ICD-10-CM

## 2018-03-22 DIAGNOSIS — M6281 Muscle weakness (generalized): Secondary | ICD-10-CM

## 2018-03-22 NOTE — Therapy (Signed)
[facility] [place], [place], [address] Phone: [phone]   Fax:  [phone]  Physical Therapy Treatment  Patient Details  Name: BLANCHIE ZELEZNIK MRN: [id number] Date of Birth: 1941-01-26 Referring Provider: Shelia Media   Encounter Date: 03/22/2018  PT End of Session - 03/22/18 1426    Visit Number  16    Date for PT Re-Evaluation  04/15/18    PT Start Time  4196    PT Stop Time  2229    PT Time Calculation (min)  48 min       Past Medical History:  Diagnosis Date  . Adjustment reaction with anxiety 12/20/2015  . Aortic stenosis   . CAD (coronary artery disease) 06/09/2017   Nuc study 10/18: EF 69, inferolateral perfusion defect-possible small infarct with peri-infarct ischemia; intermediate risk // LHC 10/18: pLAD 25, pLCx 50, pRCA 25 >> med Rx  . Carotid stenosis, asymptomatic, bilateral 02/27/2016   Carotid US 5/17: Bilateral ICA 1-39  . Cerebrovascular accident (stroke) ([place])    a. 12/2015 - cryptogenic.  S/P MDT Linq.  . Difficulty walking   . Essential hypertension   . Gait disorder 04/07/2016  . Heart palpitations 09/06/2012  . History of CVA with residual deficit 12/14/2015  . History of dizziness   . History of echocardiogram    Echo 10/18: Vigorous LVF, EF 65-70, normal wall motion, grade 1 diastolic dysfunction, GLS -21.1%, mild RAE  . History of nuclear stress test    Nuclear stress test 10/18: EF 69, inf-lat defect c/w poss infarct with peri-infarct ischemia; Intermediate Risk  . Hyperlipidemia   . Hypothyroidism   . Left hemiparesis ([place])   . LVH (left ventricular hypertrophy)   . Mitral regurgitation    a. 12/2015 Echo: EF 60-65%, mild focal basal hypertrophy. No rwma, triv AI, mild MR, mildly dil LA w/o thrombus. No RA thrombus. No PFO.  Marland Kitchen Palpitations   . Prediabetes   . Stroke-like symptom 12/13/2015  . SVT (supraventricular tachycardia) ([place]) 12/14/2015  . Vascular parkinsonism ([place])  06/29/2016  . Vitamin D deficiency     Past Surgical History:  Procedure Laterality Date  . CARDIOVASCULAR STRESS TEST  2002   NORMAL  . EP IMPLANTABLE DEVICE N/A 12/17/2015   Procedure: Loop Recorder Insertion;  Surgeon: [name], MD;  Location: [facility];  Service: Cardiovascular;  Laterality: N/A;  . LEFT HEART CATH AND CORONARY ANGIOGRAPHY N/A 05/25/2017   Procedure: LEFT HEART CATH AND CORONARY ANGIOGRAPHY;  Surgeon: Sherren Mocha, MD;  Location: [place] [facility];  Service: Cardiovascular;  Laterality: N/A;  . LOOP RECORDER IMPLANT    . TEE WITHOUT CARDIOVERSION N/A 12/17/2015   Procedure: TRANSESOPHAGEAL ECHOCARDIOGRAM (TEE);  Surgeon: Lelon Perla, MD;  Location: [facility] ENDOSCOPY;  Service: Cardiovascular;  Laterality: N/A;  Pt also needs a LOOP  . TRANSTHORACIC ECHOCARDIOGRAM  2008   SHOWED LEFT VENTRICULAR HYPERTROPHY AND MILD AORTIC STENOSIS    There were no vitals filed for this visit.  Subjective Assessment - 03/22/18 1358    Subjective  not a good day, I feel so weak. I am on a medicine ot lower BP- wonder if that is making me feel worse    Currently in Pain?  No/denies                       [facility] Adult PT Treatment/Exercise - 03/22/18 0001      Ambulation/Gait   Gait  Comments  worked on gait with increased stride/cadeance and turns- cuing needed      High Level Balance   High Level Balance Activities  Negotitating around obstacles;Negotiating over obstacles      Knee/Hip Exercises: Aerobic   Recumbent Bike  6 min    Nustep  L 4 6 min    Stepper  UBE 2 fwd/2 back L 2      Knee/Hip Exercises: Machines for Strengthening   Cybex Knee Extension  10lbs 2x10 LLE 5lb 1x10    Cybex Knee Flexion  20# 2 sets 10; 10lb LLE 10     Cybex Leg Press  30lb 3x10               PT Short Term Goals - 01/27/18 1246      PT SHORT TERM GOAL #1   Title  independent with her home HEP    Baseline  balance at counter marching fwd/back,side  stepping and tandem    Time  2    Period  Weeks    Status  On-going        PT Long Term Goals - 03/08/18 1413      PT LONG TERM GOAL #1   Title  decrease TUG time to 14 seconds for functional gait and safety    Status  Achieved      PT LONG TERM GOAL #2   Title  increase Berg balance score to 46/56 for safety    Status  Achieved      PT LONG TERM GOAL #3   Title  increase LE strength (particularly ABD and ext.) to 4/5 for functional gait and safety    Status  Achieved      PT LONG TERM GOAL #4   Title  be able to do a sit to stand without assistance and without losing balance for functional use    Status  Partially Met            Plan - 03/22/18 1426    Clinical Impression Statement  pt having a rough day walking, decreased ability to move left leg today and needs HHA. pt did very well on machines today and continue to educ for independance ex program- again stressed need for regular ex/activity.fatigued and SOB with ex and rest breaks needed    PT Treatment/Interventions  Stair training;Gait training;Functional mobility training;Therapeutic activities;Therapeutic exercise;Balance training;Patient/family education    PT Next Visit Plan  next sessions educ pt and caregiver for transition to independant gym program       Patient will benefit from skilled therapeutic intervention in order to improve the following deficits and impairments:  Abnormal gait, Decreased strength, Decreased balance, Difficulty walking, Decreased activity tolerance, Decreased coordination, Decreased mobility  Visit Diagnosis: Muscle weakness (generalized)  Difficulty in walking, not elsewhere classified  Balance problem     Problem List Patient Active Problem List   Diagnosis Date Noted  . Familial hypercholesteremia 02/21/2018  . Psychogenic gait 10/21/2017  . Coronary artery disease involving native coronary artery of native heart without angina pectoris 06/09/2017  . Abnormal nuclear  cardiac imaging test 05/25/2017  . PVC's (premature ventricular contractions) 05/17/2017  . Anxiety 12/29/2016  . Vascular parkinsonism ([place]) 06/29/2016  . Gait disorder 04/07/2016  . Intracranial carotid stenosis, bilateral 02/27/2016  . Essential hypertension   . Left-sided weakness 12/28/2015  . Headache 12/28/2015  . Adjustment reaction with anxiety 12/20/2015  . Left hemiparesis ([place])   . History of CVA with residual deficit   .  Prediabetes   . SVT (supraventricular tachycardia) ([place]) 12/14/2015  . Stroke-like symptom 12/13/2015  . History of stroke   . Heart palpitations 09/06/2012  . Hypertension   . Familial hyperlipidemia   . Hypothyroidism   . Vitamin D deficiency   . Palpitations   . History of dizziness   . LVH (left ventricular hypertrophy)   . Aortic stenosis   . Mitral regurgitation   . SOB (shortness of breath)   . Difficulty walking     Annaliah Rivenbark,ANGIE PTA 03/22/2018, 2:29 PM  [place], [place], [address] Phone: [phone]   Fax:  [phone]  Name: MATILYNN DACEY MRN: [id number] Date of Birth: February 24, 1941

## 2018-03-24 DIAGNOSIS — R0989 Other specified symptoms and signs involving the circulatory and respiratory systems: Secondary | ICD-10-CM | POA: Diagnosis not present

## 2018-03-25 ENCOUNTER — Ambulatory Visit (INDEPENDENT_AMBULATORY_CARE_PROVIDER_SITE_OTHER): Payer: PPO | Admitting: *Deleted

## 2018-03-25 DIAGNOSIS — I639 Cerebral infarction, unspecified: Secondary | ICD-10-CM

## 2018-03-25 NOTE — Progress Notes (Signed)
Carelink Summary Report / Loop Recorder 

## 2018-03-28 ENCOUNTER — Ambulatory Visit: Payer: PPO | Admitting: Cardiovascular Disease

## 2018-03-28 ENCOUNTER — Encounter: Payer: Self-pay | Admitting: Cardiovascular Disease

## 2018-03-28 VITALS — BP 126/78 | HR 87 | Ht 62.0 in | Wt 118.0 lb

## 2018-03-28 DIAGNOSIS — Z8673 Personal history of transient ischemic attack (TIA), and cerebral infarction without residual deficits: Secondary | ICD-10-CM

## 2018-03-28 DIAGNOSIS — I2510 Atherosclerotic heart disease of native coronary artery without angina pectoris: Secondary | ICD-10-CM | POA: Diagnosis not present

## 2018-03-28 DIAGNOSIS — I10 Essential (primary) hypertension: Secondary | ICD-10-CM

## 2018-03-28 NOTE — Progress Notes (Signed)
Office Visit    Patient Name: Stacey Ramirez Date of Encounter: 03/28/2018  Primary Care Provider:  Deland Pretty, MD Primary Cardiologist:  Stacey Browner, MD  P. Anselma Herbel, MD    77 year old female with a history of palpitations, hypertension, hyperlipidemia, and recent stroke who presents for follow-up.    Past Medical History    Past Medical History:  Diagnosis Date  . Adjustment reaction with anxiety 12/20/2015  . Aortic stenosis   . CAD (coronary artery disease) 06/09/2017   Nuc study 10/18: EF 69, inferolateral perfusion defect-possible small infarct with peri-infarct ischemia; intermediate risk // LHC 10/18: pLAD 25, pLCx 50, pRCA 25 >> med Rx  . Carotid stenosis, asymptomatic, bilateral 02/27/2016   Carotid US 5/17: Bilateral ICA 1-39  . Cerebrovascular accident (stroke) ([place])    a. 12/2015 - cryptogenic.  S/P MDT Linq.  . Difficulty walking   . Essential hypertension   . Gait disorder 04/07/2016  . Heart palpitations 09/06/2012  . History of CVA with residual deficit 12/14/2015  . History of dizziness   . History of echocardiogram    Echo 10/18: Vigorous LVF, EF 65-70, normal wall motion, grade 1 diastolic dysfunction, GLS -21.1%, mild RAE  . History of nuclear stress test    Nuclear stress test 10/18: EF 69, inf-lat defect c/w poss infarct with peri-infarct ischemia; Intermediate Risk  . Hyperlipidemia   . Hypothyroidism   . Left hemiparesis ([place])   . LVH (left ventricular hypertrophy)   . Mitral regurgitation    a. 12/2015 Echo: EF 60-65%, mild focal basal hypertrophy. No rwma, triv AI, mild MR, mildly dil LA w/o thrombus. No RA thrombus. No PFO.  Marland Kitchen Palpitations   . Prediabetes   . Stroke-like symptom 12/13/2015  . SVT (supraventricular tachycardia) ([place]) 12/14/2015  . Vascular parkinsonism ([place]) 06/29/2016  . Vitamin D deficiency    Past Surgical History:  Procedure Laterality Date  . CARDIOVASCULAR STRESS TEST  2002   NORMAL  . EP IMPLANTABLE DEVICE N/A 12/17/2015     Procedure: Loop Recorder Insertion;  Surgeon: Stacey Grayer, MD;  Location: [place] [facility];  Service: Cardiovascular;  Laterality: N/A;  . LEFT HEART CATH AND CORONARY ANGIOGRAPHY N/A 05/25/2017   Procedure: LEFT HEART CATH AND CORONARY ANGIOGRAPHY;  Surgeon: Stacey Mocha, MD;  Location: [place] [facility];  Service: Cardiovascular;  Laterality: N/A;  . LOOP RECORDER IMPLANT    . TEE WITHOUT CARDIOVERSION N/A 12/17/2015   Procedure: TRANSESOPHAGEAL ECHOCARDIOGRAM (TEE);  Surgeon: Stacey Perla, MD;  Location: [facility] ENDOSCOPY;  Service: Cardiovascular;  Laterality: N/A;  Pt also needs a LOOP  . TRANSTHORACIC ECHOCARDIOGRAM  2008   SHOWED LEFT VENTRICULAR HYPERTROPHY AND MILD AORTIC STENOSIS    Allergies  Allergies  Allergen Reactions  . Sulfa Drugs Cross Reactors Itching  . Zocor [Simvastatin] Other (See Comments)    Feels like she is going to pass out, weakness  . Crestor [Rosuvastatin Calcium]     Muscle weakness  . Statins Other (See Comments)    Unable to walk or stand. Unable to walk or stand. Unable to walk or stand.  . Sulfa Antibiotics     welts  . Micardis [Telmisartan] Other (See Comments)    Feels like she is going to pass out    Notes from Stacey Bayley, NP    77 year old female previously followed by Stacey Ramirez for hypertension and palpitations. She recently was admitted for cryptogenic stroke (right basal ganglia infarct), and underwent TEE revealing normal LV  function without evidence of atrial thrombus. She subsequently underwent placement of an implantable loop recorder. She has had no events on this recorder up to this point. She has been recovering well from her stroke and can now walk, though with a limp. She is encouraged overall. Her blood pressure at home has been running in the 140s to 150s, and her husband has been checking it about 3 times per day. She has not been having any palpitations, chest pain, dyspnea, PND, orthopnea, dizziness, syncope, edema,  or early satiety.  Oct. 20 ,2017: Stacey Ramirez is seen for the first time today.  Transfer from [place] . Has some leg / feet swelling  - likely due to the amlodipine .   Dec 01, 2016:  Is having some difficulty in walking since her stroke.   Working with PT.  Brought her BP log with her.   Has some elevated readings and then later will have low readings.  Suggested that she might be eating salty foods at times .   No CP or dyspnea    Has an implantable loop recorder.   Has not had any syncope Has not had it interrogated.      Has poor balance   Feb. 11, 2019  She has history of hypertension and hyperlipidemia. She was admitted with some shortness of breath  Cath 05/25/17  revealed mild - moderate CAD ,  No obstructive lesion s  Has done well since then .  Has some DOE with exercise  Is still very limited by her stroke   Aug. 26, 2019: Stacey Ramirez is seen today for follow up  She is had a stroke.  She has an implantable loop recorder in place. Was recovering from her stroke. Had sudden weakness of her left leg.    Has had some elevated BP .    Home Medications    Prior to Admission medications   Medication Sig Start Date End Date Taking? Authorizing Provider  ALPRAZolam (XANAX) 0.25 MG tablet Take 1 tablet (0.25 mg total) by mouth at bedtime. 12/25/15  Yes Stacey Anchors Love, PA-C  amLODipine (NORVASC) 10 MG tablet Take 1 tablet (10 mg total) by mouth 2 (two) times daily. Reported on 12/27/2015 01/23/16  Yes Stacey Mire, NP  aspirin EC 325 MG EC tablet Take 1 tablet (325 mg total) by mouth daily. 12/17/15  Yes Stacey Hamman, MD  B Complex-C-Folic Acid (STRESS FORMULA) TABS Take 1 tablet by mouth daily.    Yes Historical Provider, MD  carboxymethylcellulose (REFRESH PLUS) 0.5 % SOLN Place 1 drop into both eyes 3 (three) times daily as needed.   Yes Historical Provider, MD  clopidogrel (PLAVIX) 75 MG tablet Take 1 tablet (75 mg total) by mouth daily. 12/24/15  Yes Stacey Anchors Love, PA-C   estradiol (ESTRACE) 0.1 MG/GM vaginal cream Place 2 g vaginally as needed (dryness).    Yes Historical Provider, MD  irbesartan (AVAPRO) 300 MG tablet Take 1 tablet (300 mg total) by mouth daily. 12/29/15  Yes Velvet Bathe, MD  levothyroxine (SYNTHROID, LEVOTHROID) 25 MCG tablet Take 25 mcg by mouth daily before breakfast.  09/21/14  Yes Historical Provider, MD  LOTEMAX 0.5 % ophthalmic suspension Place 1 drop into both eyes 4 (four) times daily as needed. For dry itchy eyes 02/17/13  Yes Historical Provider, MD  metoprolol tartrate (LOPRESSOR) 25 MG tablet Take 0.5 tablets (12.5 mg total) by mouth 2 (two) times daily. 12/24/15  Yes Stacey Anchors Love, PA-C  Multiple Vitamin (  MULTIVITAMIN WITH MINERALS) TABS tablet Take 1 tablet by mouth daily.   Yes Historical Provider, MD  polyethylene glycol (MIRALAX / GLYCOLAX) packet Take 17 g by mouth daily. Patient taking differently: Take 17 g by mouth daily as needed for moderate constipation.  12/17/15  Yes Stacey Hamman, MD  sertraline (ZOLOFT) 50 MG tablet Take 50 mg by mouth daily.  01/02/16  Yes Historical Provider, MD    Review of Systems      Physical Exam: Blood pressure 126/78, pulse 87, height 5\' 2"  (1.575 m), weight 118 lb (53.5 kg), SpO2 98 %.  GEN:  Elderly female,  HEENT: Normal NECK: No JVD; No carotid bruits LYMPHATICS: No lymphadenopathy CARDIAC: RRR , no murmurs, rubs, gallops RESPIRATORY:  Clear to auscultation without rales, wheezing or rhonchi  ABDOMEN: Soft, non-tender, non-distended MUSCULOSKELETAL:  No edema; No deformity  SKIN: Warm and dry NEUROLOGIC:  Alert and oriented x 3    Accessory Clinical Findings    ECG   : March 28, 2018: Normal sinus rhythm at 87.  The nonspecific IVCD is no longer seen.  She has nonspecific ST T abnormalities.   Assessment & Plan    1.  Cryptogenic stroke:  Status post loop recorder.   2. Essential hypertension:    Intermittently has very high blood pressure readings.  I suspect she is  eating more salt than usual on occasion.  At present she is on a fairly good red medical regimen.  Her blood pressure is normal today.  I hesitate to increase any of her medications at this time.  She will continue to watch.    4. Hyperlipidemia:   Has been intolerant to statins.  PCSK9 inhibitor did not work.   5. CAD - mild - mod    Mertie Moores, MD  03/28/2018 4:17 PM    [facility] [place],  [place], [place]  [address] Pager [phone] Phone: [phone]; Fax: [phone]

## 2018-03-28 NOTE — Patient Instructions (Signed)
Medication Instructions:  Your physician recommends that you continue on your current medications as directed. Please refer to the Current Medication list given to you today.   Labwork: None Ordered   Testing/Procedures: None Ordered   Follow-Up: Your physician wants you to follow-up in: 6 months with Scott Weaver, PA.  You will receive a reminder letter in the mail two months in advance. If you don't receive a letter, please call our office to schedule the follow-up appointment.   If you need a refill on your cardiac medications before your next appointment, please call your pharmacy.   Thank you for choosing [facility]! Michelle Swinyer, RN [phone]    

## 2018-03-29 ENCOUNTER — Encounter: Payer: Self-pay | Admitting: Physical Therapy

## 2018-03-29 ENCOUNTER — Ambulatory Visit: Payer: PPO | Admitting: Physical Therapy

## 2018-03-29 DIAGNOSIS — R262 Difficulty in walking, not elsewhere classified: Secondary | ICD-10-CM

## 2018-03-29 DIAGNOSIS — M6281 Muscle weakness (generalized): Secondary | ICD-10-CM

## 2018-03-29 DIAGNOSIS — R2689 Other abnormalities of gait and mobility: Secondary | ICD-10-CM

## 2018-03-29 NOTE — Therapy (Signed)
[place], [place], [address] Phone: [phone]   Fax:  [phone]  Physical Therapy Treatment  Patient Details  Name: Stacey Ramirez MRN: [id number] Date of Birth: [birth year] Referring Provider: Shelia Media   Encounter Date: 03/29/2018  PT End of Session - 03/29/18 1406    Visit Number  17    Date for PT Re-Evaluation  04/15/18    PT Start Time  1400    PT Stop Time  1450    PT Time Calculation (min)  50 min       Past Medical History:  Diagnosis Date  . Adjustment reaction with anxiety 12/20/2015  . Aortic stenosis   . CAD (coronary artery disease) 06/09/2017   Nuc study 10/18: EF 69, inferolateral perfusion defect-possible small infarct with peri-infarct ischemia; intermediate risk // LHC 10/18: pLAD 25, pLCx 50, pRCA 25 >> med Rx  . Carotid stenosis, asymptomatic, bilateral 02/27/2016   Carotid US 5/17: Bilateral ICA 1-39  . Cerebrovascular accident (stroke) ([place])    a. 12/2015 - cryptogenic.  S/P MDT Linq.  . Difficulty walking   . Essential hypertension   . Gait disorder 04/07/2016  . Heart palpitations 09/06/2012  . History of CVA with residual deficit 12/14/2015  . History of dizziness   . History of echocardiogram    Echo 10/18: Vigorous LVF, EF 65-70, normal wall motion, grade 1 diastolic dysfunction, GLS -21.1%, mild RAE  . History of nuclear stress test    Nuclear stress test 10/18: EF 69, inf-lat defect c/w poss infarct with peri-infarct ischemia; Intermediate Risk  . Hyperlipidemia   . Hypothyroidism   . Left hemiparesis ([place])   . LVH (left ventricular hypertrophy)   . Mitral regurgitation    a. 12/2015 Echo: EF 60-65%, mild focal basal hypertrophy. No rwma, triv AI, mild MR, mildly dil LA w/o thrombus. No RA thrombus. No PFO.  Marland Kitchen Palpitations   . Prediabetes   . Stroke-like symptom 12/13/2015  . SVT (supraventricular tachycardia) ([place]) 12/14/2015  . Vascular parkinsonism ([place])  06/29/2016  . Vitamin D deficiency     Past Surgical History:  Procedure Laterality Date  . CARDIOVASCULAR STRESS TEST  2002   NORMAL  . EP IMPLANTABLE DEVICE N/A 12/17/2015   Procedure: Loop Recorder Insertion;  Surgeon: [name], MD;  Location: [place] [facility];  Service: Cardiovascular;  Laterality: N/A;  . LEFT HEART CATH AND CORONARY ANGIOGRAPHY N/A 05/25/2017   Procedure: LEFT HEART CATH AND CORONARY ANGIOGRAPHY;  Surgeon: Sherren Mocha, MD;  Location: [place] [facility];  Service: Cardiovascular;  Laterality: N/A;  . LOOP RECORDER IMPLANT    . TEE WITHOUT CARDIOVERSION N/A 12/17/2015   Procedure: TRANSESOPHAGEAL ECHOCARDIOGRAM (TEE);  Surgeon: Lelon Perla, MD;  Location[facility] END[facility]COPY;  Service: Cardiovascular;  Laterality: N/A;  Pt also needs a LOOP  . TRANSTHORACIC ECHOCARDIOGRAM  2008   SHOWED LEFT VENTRICULAR HYPERTROPHY AND MILD AORTIC STENOSIS    There were no vitals filed for this visit.  Subjective Assessment - 03/29/18 1404    Subjective  stopped beta blocker because I was so tired- "tired of being tired" still struggling with dizziness    Currently in Pain?  No/denies                       [facility] Adult PT Treatment/Exercise - 03/29/18 0001      High Level Balance   High Level Balance Activities  Marching forwards;Marching  backwards;Side stepping;Negotiating over obstacles   3# ankle wts HHA   High Level Balance Comments  HHA 3# alt kick 3 way      Knee/Hip Exercises: Aerobic   Recumbent Bike  6 min    Nustep  L 5 6 min    Stepper  UBE 2 fwd/2 back L 2      Knee/Hip Exercises: Machines for Strengthening   Cybex Knee Extension  10lbs 2x10 LLE 5lb 1x10    Cybex Knee Flexion  20# 2 sets 10; 10lb LLE 10     Cybex Leg Press  30lb 3x10               PT Short Term Goals - 01/27/18 1246      PT SHORT TERM GOAL #1   Title  independent with her home HEP    Baseline  balance at counter marching fwd/back,side stepping and tandem     Time  2    Period  Weeks    Status  On-going        PT Long Term Goals - 03/29/18 1405      PT LONG TERM GOAL #4   Title  be able to do a sit to stand without assistance and without losing balance for functional use    Baseline  pt still slow with STS and initial balance with when starting gait    Status  Partially Met            Plan - 03/29/18 1415    Clinical Impression Statement  pt progressing with goals- still difficulty with STS and initial balance with gait progression. focus session on strength and endurance and gait without assistant btwn machines    PT Next Visit Plan  next sessions educ pt and caregiver for transition to independant gym program ( husband unable to stay today for educ on ex)       Patient will benefit from skilled therapeutic intervention in order to improve the following deficits and impairments:  Abnormal gait, Decreased strength, Decreased balance, Difficulty walking, Decreased activity tolerance, Decreased coordination, Decreased mobility  Visit Diagnosis: Muscle weakness (generalized)  Difficulty in walking, not elsewhere classified  Balance problem     Problem List Patient Active Problem List   Diagnosis Date Noted  . Familial hypercholesteremia 02/21/2018  . Psychogenic gait 10/21/2017  . Coronary artery disease involving native coronary artery of native heart without angina pectoris 06/09/2017  . Abnormal nuclear cardiac imaging test 05/25/2017  . PVC's (premature ventricular contractions) 05/17/2017  . Anxiety 12/29/2016  . Vascular parkinsonism ([facility]) 06/29/2016  . Gait disorder 04/07/2016  . Intracranial carotid stenosis, bilateral 02/27/2016  . Essential hypertension   . Left-sided weakness 12/28/2015  . Headache 12/28/2015  . Adjustment reaction with anxiety 12/20/2015  . Left hemiparesis ([facility])   . History of CVA with residual deficit   . Prediabetes   . SVT (supraventricular tachycardia) ([facility]) 12/14/2015  .  Stroke-like symptom 12/13/2015  . History of stroke   . Heart palpitations 09/06/2012  . Hypertension   . Familial hyperlipidemia   . Hypothyroidism   . Vitamin D deficiency   . Palpitations   . History of dizziness   . LVH (left ventricular hypertrophy)   . Aortic stenosis   . Mitral regurgitation   . SOB (shortness of breath)   . Difficulty walking     ,ANGIE PTA 03/29/2018, 2:37 PM  [facility] [address] [place],   [place], [address] Phone: [phone]   Fax:  [phone]  Name: Stacey Ramirez MRN: [id number] Date of Birth: 01/05/1941   

## 2018-03-30 ENCOUNTER — Encounter: Payer: Self-pay | Admitting: Sports Medicine

## 2018-04-05 ENCOUNTER — Ambulatory Visit: Payer: PPO | Admitting: Physical Therapy

## 2018-04-05 ENCOUNTER — Ambulatory Visit: Payer: PPO | Admitting: Sports Medicine

## 2018-04-05 ENCOUNTER — Encounter: Payer: Self-pay | Admitting: Sports Medicine

## 2018-04-05 ENCOUNTER — Telehealth: Payer: Self-pay | Admitting: Internal Medicine

## 2018-04-05 VITALS — BP 126/80 | Ht 62.0 in | Wt 115.0 lb

## 2018-04-05 DIAGNOSIS — R531 Weakness: Secondary | ICD-10-CM | POA: Diagnosis not present

## 2018-04-05 NOTE — Progress Notes (Signed)
Subjective:    Patient ID: Stacey Ramirez, female    DOB: 12-05-1940, 77 y.o.   MRN: [id number]  HPI: 37yoF with pmh of CVA in 2017 with hx of left sided hemiparesis presents for evaluation of left shoulder pain and "hip flexor weakness". Patient presents to office visit with her husband. Both patient and husband help with history of present concern.   In terms of her left shoulder pain: patient noticed left shoulder pain about 6 months ago. Her left shoulder pain is moderate in intensity and is only present with movement of her left shoulder. When present, pain is mostly localized along the lateral aspect of her shoulder.  Additionally, she reports that she can no longer raise her left arm above her her head. She has not tried medication for this pain as it is only present with movement.  Of note, she currently walks with a walker and has noted a need to support herself more in the past few months due to increased unsteadiness in LLE.  She denies any obvious trauma/injury/fall. She also denies left shoulder swelling, erythema, change in sensation, or increased weakness in left upper extremity from baseline.   In terms of her "hip flexor weakness" :  Onset was about 6 months ago. Patient reports that prior to this, her left lower extremity strength has improved drastically since her stroke. Suddenly, a day or two before her birthday (about 5-6 months ago), she noted significant instability in her gait to the point that she had trouble moving lifting her leg to walk. She denied any vertigo, facial drooping, change in sensation, or any weakness in LLE from baseline at that time. She did go to ER for evaluation due to concern for a new CVA.  Patient was discharged from ER and advised to follow up with PCP and neurologist. She has been going to PT since her stroke (2017). Per patient, physical therapist noted a difference in stability. Additionally, per patient, Neurologist also noticed weakness in right hip  flexor. An MRI of her lumbar spine was ordered around that time. MRI results are not available to me during office visit, however, patient and her husband report that MRI was unremarkable.  Patient was then evaluated by PCP after neurology appointment. PCP also noticed weakness in hip flexor and referred patient to me for further evaluation.  Patient currently denies any pain in her spine or her hips, change in sensation in her LE, or bowel/bladder incontinence.    Review of Systems As per above. I have reviewed pmh, medications, and previous images    Objective:   Physical Exam G: Alert, cooperative, pleasant, no acute distress Vital signs reviewed PULM: no conversational dyspnea. No respiratory distress  MSK: L SHOULDER:  No obvious deformities of shoulder joint. +4/5 strength in supraspinatus and external rotation. 5/5 strength in subscapularis and with internal rotation of rotator cuff. ROM limited to 90 degrees with active movement. Patient is able to tolerate her arm lifting to about 110 degrees with passive motion.  + Neers, Hawkings, empty can test. Sensation intact. S Shoulder: full range of motion. Good strength. Good pulses distally.  L. HIP:  No obvious deformity of left hip. +4/5 strength with flexion of hip and extension of knee.  +5/5 strength with hip extension, hip adduction, hip abduction, and knee flexion. ROM intact. Sensation intact. + Trendelenburg when standing on right foot. R. HIP: No obvious deformity of left hip. +5/5 strength with hip flexion, extension, abduction,adduction, knee flexion, and knee  extension. ROM intact. Sensation intact.    BACK:  No spinal tenderness. NEURO: +3 patellar and achilles reflexes bilaterally. Patient requires assistance with gait. She holds onto examination table while walking as she did not bring her walker into office visit. Pt demonstrates caution when using left leg.     Assessment & Plan:  Left Rotator Cuff Injury  Due to  physical exam, I am concerned for a left sided rotator cuff injury.  Plan on patient returning later on this week for ultrasound of shoulder with possible CSI if pain has worsened. Her pain is most likely due to patient requiring to stabilize herself with walker due to LE weakness.   Left Lower extremity weakness Due to pmh. HPI, and physical exam, I am concerned for an underlying neurological etiology vs residual left sided weakness form CVA vs deconditioning.  I have discussed possibility of EMG/NCS with patient and her husband in order to further evaluation for neurological etiology.  Both agree to EMG. Recent MRI of her lumber spine has been performed. Husband states he has copy of MRI at home. I have advised patient and her husband to please bring copy of MRI result to clinic at their next appointment.  Follow up after EMG to discuss results.   Note typed by Jules Husbands, MD PGY3  Patient was seen and evaluated with the resident. I agree with the above plan of care. Patient likely has an attritional rotator cuff tear in the left shoulder. We will plan on doing an ultrasound evaluation later this week and possibly a subacromial cortisone injection. Her left leg weakness is not straightforward. She does not appear to have hip osteoarthritis. She had an MRI of her lumbar spine recently which was fairly unremarkable per her husband's report. He will try to get me that study. I do think it's reasonable to proceed with an EMG/nerve conduction study of the left lower extremity. We will schedule this with [facility] as she has had previous EMGs done there.

## 2018-04-05 NOTE — Telephone Encounter (Signed)
Spoke with pt husband, he reports they feel the patient maybe having a reaction to the zetia. She is complaining of tiredness, weakness and SOB. He also reports she woke with severe pain in the right leg, from the knee to the hip. There is no swelling or reddness of the leg and the husband can not see anything wrong. Instructed patient to stop the zetia. Do not feel the leg pain is related to the zetia. Advised to contact the medical doctor about the right leg pain.

## 2018-04-05 NOTE — Telephone Encounter (Signed)
Pt c/o medication issue:  1. Name of Medication: Generic Zetia  2. How are you currently taking this medication (dosage and times per day)?  1 a day 3. Are you having a reaction (difficulty breathing--STAT)?Yes a little  4. What is your medication issue? Pain right leg,pressure is up real high 200/100

## 2018-04-06 DIAGNOSIS — N390 Urinary tract infection, site not specified: Secondary | ICD-10-CM | POA: Diagnosis not present

## 2018-04-06 DIAGNOSIS — N3941 Urge incontinence: Secondary | ICD-10-CM | POA: Diagnosis not present

## 2018-04-07 ENCOUNTER — Ambulatory Visit: Payer: PPO | Attending: Internal Medicine | Admitting: Physical Therapy

## 2018-04-07 ENCOUNTER — Encounter: Payer: Self-pay | Admitting: Physical Therapy

## 2018-04-07 DIAGNOSIS — R2689 Other abnormalities of gait and mobility: Secondary | ICD-10-CM | POA: Diagnosis not present

## 2018-04-07 DIAGNOSIS — M6281 Muscle weakness (generalized): Secondary | ICD-10-CM | POA: Diagnosis not present

## 2018-04-07 DIAGNOSIS — R262 Difficulty in walking, not elsewhere classified: Secondary | ICD-10-CM | POA: Diagnosis not present

## 2018-04-07 LAB — CUP PACEART REMOTE DEVICE CHECK
Implantable Pulse Generator Implant Date: 20170516
MDC IDC SESS DTM: 20190721134026

## 2018-04-07 NOTE — Therapy (Signed)
[place], [place], [address] Phone: [phone]   Fax:  [phone]  Physical Therapy Treatment  Patient Details  Name: Stacey Ramirez MRN: [id number] Date of Birth: [birth year] Referring Provider: Shelia Media   Encounter Date: 04/07/2018  PT End of Session - 04/07/18 0936    Visit Number  18    Date for PT Re-Evaluation  04/15/18    PT Start Time  0930    PT Stop Time  1020    PT Time Calculation (min)  50 min       Past Medical History:  Diagnosis Date  . Adjustment reaction with anxiety 12/20/2015  . Aortic stenosis   . CAD (coronary artery disease) 06/09/2017   Nuc study 10/18: EF 69, inferolateral perfusion defect-possible small infarct with peri-infarct ischemia; intermediate risk // LHC 10/18: pLAD 25, pLCx 50, pRCA 25 >> med Rx  . Carotid stenosis, asymptomatic, bilateral 02/27/2016   Carotid US 5/17: Bilateral ICA 1-39  . Cerebrovascular accident (stroke) ([place])    a. 12/2015 - cryptogenic.  S/P MDT Linq.  . Difficulty walking   . Essential hypertension   . Gait disorder 04/07/2016  . Heart palpitations 09/06/2012  . History of CVA with residual deficit 12/14/2015  . History of dizziness   . History of echocardiogram    Echo 10/18: Vigorous LVF, EF 65-70, normal wall motion, grade 1 diastolic dysfunction, GLS -21.1%, mild RAE  . History of nuclear stress test    Nuclear stress test 10/18: EF 69, inf-lat defect c/w poss infarct with peri-infarct ischemia; Intermediate Risk  . Hyperlipidemia   . Hypothyroidism   . Left hemiparesis ([place])   . LVH (left ventricular hypertrophy)   . Mitral regurgitation    a. 12/2015 Echo: EF 60-65%, mild focal basal hypertrophy. No rwma, triv AI, mild MR, mildly dil LA w/o thrombus. No RA thrombus. No PFO.  Marland Kitchen Palpitations   . Prediabetes   . Stroke-like symptom 12/13/2015  . SVT (supraventricular tachycardia) ([place]) 12/14/2015  . Vascular parkinsonism ([place])  06/29/2016  . Vitamin D deficiency     Past Surgical History:  Procedure Laterality Date  . CARDIOVASCULAR STRESS TEST  2002   NORMAL  . EP IMPLANTABLE DEVICE N/A 12/17/2015   Procedure: Loop Recorder Insertion;  Surgeon: [name], MD;  Location: [place] [facility];  Service: Cardiovascular;  Laterality: N/A;  . LEFT HEART CATH AND CORONARY ANGIOGRAPHY N/A 05/25/2017   Procedure: LEFT HEART CATH AND CORONARY ANGIOGRAPHY;  Surgeon: Sherren Mocha, MD;  Location: [facility];  Service: Cardiovascular;  Laterality: N/A;  . LOOP RECORDER IMPLANT    . TEE WITHOUT CARDIOVERSION N/A 12/17/2015   Procedure: TRANSESOPHAGEAL ECHOCARDIOGRAM (TEE);  Surgeon: Lelon Perla, MD;  Location: [facility];  Service: Cardiovascular;  Laterality: N/A;  Pt also needs a LOOP  . TRANSTHORACIC ECHOCARDIOGRAM  2008   SHOWED LEFT VENTRICULAR HYPERTROPHY AND MILD AORTIC STENOSIS    There were no vitals filed for this visit.  Subjective Assessment - 04/07/18 0933    Subjective  "early morning- slow and staggery" episode earlier this week with pain in RT leg- now weakness in both    Currently in Pain?  No/denies                       [facility] Adult PT Treatment/Exercise - 04/07/18 0001      High Level Balance   High Level Balance Activities  Negotitating around obstacles;Negotiating over obstacles   assistance needed     Knee/Hip Exercises: Aerobic   Recumbent Bike  6 min    Nustep  L 5 6 min      Knee/Hip Exercises: Machines for Strengthening   Cybex Knee Extension  10lbs 2x10 LLE 5lb 1x10    Cybex Knee Flexion  20# 2 sets 10; 10lb LLE 10     Cybex Leg Press  30lb 3x10      Knee/Hip Exercises: Standing   Heel Raises  Both;20 reps   black bar   Other Standing Knee Exercises  HHA on airex marching, hip flex, hip abd and hip ext alt 20               PT Short Term Goals - 01/27/18 1246      PT SHORT TERM GOAL #1   Title  independent with her home HEP    Baseline   balance at counter marching fwd/back,side stepping and tandem    Time  2    Period  Weeks    Status  On-going        PT Long Term Goals - 04/07/18 0936      PT LONG TERM GOAL #4   Title  be able to do a sit to stand without assistance and without losing balance for functional use    Baseline  pt still slow with STS and initial balance with when starting gait    Status  Partially Met            Plan - 04/07/18 0958    Clinical Impression Statement  pt continues to struggle with gait ,esp with initiation upon standing and with turns. pt is reliant on spouse in community for HHA and using waler in home 50% of the time. pt MMT fairly stronger and does machines well not translating into inproved gait.    PT Treatment/Interventions  Stair training;Gait training;Functional mobility training;Therapeutic activities;Therapeutic exercise;Balance training;Patient/family education    PT Next Visit Plan  next sessions educ pt and caregiver for transition to independant gym program ( husband had fall over and to sore for educ on ex) Recert/Discharge 8/14       Patient will benefit from skilled therapeutic intervention in order to improve the following deficits and impairments:  Abnormal gait, Decreased strength, Decreased balance, Difficulty walking, Decreased activity tolerance, Decreased coordination, Decreased mobility  Visit Diagnosis: Muscle weakness (generalized)  Difficulty in walking, not elsewhere classified  Balance problem     Problem List Patient Active Problem List   Diagnosis Date Noted  . Familial hypercholesteremia 02/21/2018  . Psychogenic gait 10/21/2017  . Coronary artery disease involving native coronary artery of native heart without angina pectoris 06/09/2017  . Abnormal nuclear cardiac imaging test 05/25/2017  . PVC's (premature ventricular contractions) 05/17/2017  . Anxiety 12/29/2016  . Vascular parkinsonism ([place]) 06/29/2016  . Gait disorder 04/07/2016   . Intracranial carotid stenosis, bilateral 02/27/2016  . Essential hypertension   . Left-sided weakness 12/28/2015  . Headache 12/28/2015  . Adjustment reaction with anxiety 12/20/2015  . Left hemiparesis ([place])   . History of CVA with residual deficit   . Prediabetes   . SVT (supraventricular tachycardia) ([place]) 12/14/2015  . Stroke-like symptom 12/13/2015  . History of stroke   . Heart palpitations 09/06/2012  . Hypertension   . Familial hyperlipidemia   . Hypothyroidism   . Vitamin D deficiency   . Palpitations   . History of dizziness   .  LVH (left ventricular hypertrophy)   . Aortic stenosis   . Mitral regurgitation   . SOB (shortness of breath)   . Difficulty walking     Arayna Illescas,ANGIE PTA 04/07/2018, 10:11 AM  [place], [place], [address] Phone: [phone]   Fax:  [phone]  Name: Stacey Ramirez MRN: [id number] Date of Birth: May 18, 1941

## 2018-04-08 ENCOUNTER — Encounter: Payer: Self-pay | Admitting: Sports Medicine

## 2018-04-08 ENCOUNTER — Ambulatory Visit (INDEPENDENT_AMBULATORY_CARE_PROVIDER_SITE_OTHER): Payer: PPO | Admitting: Sports Medicine

## 2018-04-08 VITALS — BP 128/72 | Ht 62.0 in | Wt 120.0 lb

## 2018-04-08 DIAGNOSIS — G8929 Other chronic pain: Secondary | ICD-10-CM | POA: Diagnosis not present

## 2018-04-08 DIAGNOSIS — M25512 Pain in left shoulder: Secondary | ICD-10-CM

## 2018-04-08 NOTE — Progress Notes (Signed)
  Kess Mcilwain Setterlund - 77 y.o. female MRN [id number]  Date of birth: 03/08/1941    SUBJECTIVE:      Chief Complaint:/ HPI:   Patient is a 77 y.o. female here for ultrasound evaluation of left shoulder pain. See office note from 04/05/2018 for history and physical.   OBJECTIVE: BP 128/72   Ht 5\' 2"  (1.575 m)   Wt 120 lb (54.4 kg)   BMI 21.95 kg/m   Physical Exam:  Vital signs are reviewed.    MSK ultrasound: Ultrasound of Shoulder-left  BT short: Fully visualized, normal appearance BT long: Fully visualized, normal appearance Supraspinatus tendon: Fully visualized.  Small articular surface partial-thickness tear near the distal insertion.  Apparent atrophy of the supraspinatus.  Cortical irregularity and degenerative changes seen.  On lateral subacromial view, there is some bone spurring of the distal acromion and narrowing of the subacromial space. Subscapularis tendon: Fully visualized, overall normal appearance with some atrophy noted Infraspinatus tendon: Fully visualized, normal appearance Teres Minor tendon: Fully visualized, normal appearance AC joint: arthritic changes seen  Summary and Additional findings - ultrasound evaluation of the shoulder reveals small articular surface partial-thickness tear of the supraspinatus.  Subacromial narrowing and arthritic change.  Overall the rotator cuff demonstrates signs of atrophy.  ASSESSMENT & PLAN:  1.  Left shoulder pain- patient's pain likely related to generalized atrophy and weakness of the rotator cuff with subsequent high riding humeral head causing narrowing subacromial space and subsequent impingement. Discussed possible treatment options to include range of motion exercises, Tylenol for pain, and potential cortisone injection in the future.  At this time patient will manage her pain with Tylenol and she was instructed on home exercises to improve range of motion.  She will follow-up as needed.   Total time spent with the patient  was 15 minutes with greater than 50% of the time spent in face-to-face consultation performing and discussing ultrasound evaluation of the shoulder as well as the findings and treatment plan as discussed above.  Patient seen and evaluated with the sports medicine fellow. I agree with the above plan of care. Patient's ultrasound shows findings consistent with rotator cuff arthropathy. We will give her some pendulum exercises. She denies cortisone injection at this time. Follow-up with me after the EMG/nerve conduction study of her left lower extremity. Her husband did bring the records from [facility] for me to review in the interim.

## 2018-04-11 DIAGNOSIS — F329 Major depressive disorder, single episode, unspecified: Secondary | ICD-10-CM | POA: Diagnosis not present

## 2018-04-11 DIAGNOSIS — I10 Essential (primary) hypertension: Secondary | ICD-10-CM | POA: Diagnosis not present

## 2018-04-12 ENCOUNTER — Other Ambulatory Visit: Payer: Self-pay

## 2018-04-12 ENCOUNTER — Telehealth: Payer: Self-pay

## 2018-04-12 ENCOUNTER — Ambulatory Visit: Payer: PPO | Admitting: Physical Therapy

## 2018-04-12 DIAGNOSIS — R2689 Other abnormalities of gait and mobility: Secondary | ICD-10-CM

## 2018-04-12 DIAGNOSIS — M6281 Muscle weakness (generalized): Secondary | ICD-10-CM | POA: Diagnosis not present

## 2018-04-12 DIAGNOSIS — R262 Difficulty in walking, not elsewhere classified: Secondary | ICD-10-CM

## 2018-04-12 DIAGNOSIS — R531 Weakness: Secondary | ICD-10-CM

## 2018-04-12 NOTE — Therapy (Signed)
[place], [place], [address] Phone: [phone]   Fax:  [phone]  Physical Therapy Treatment  Patient Details  Name: Stacey Ramirez MRN: [id number] Date of Birth: [birth year] Referring Provider: Shelia Media   Encounter Date: 04/12/2018  PT End of Session - 04/12/18 1438    Visit Number  19    Date for PT Re-Evaluation  04/15/18    PT Start Time  1400    PT Stop Time  1440    PT Time Calculation (min)  40 min       Past Medical History:  Diagnosis Date  . Adjustment reaction with anxiety 12/20/2015  . Aortic stenosis   . CAD (coronary artery disease) 06/09/2017   Nuc study 10/18: EF 69, inferolateral perfusion defect-possible small infarct with peri-infarct ischemia; intermediate risk // LHC 10/18: pLAD 25, pLCx 50, pRCA 25 >> med Rx  . Carotid stenosis, asymptomatic, bilateral 02/27/2016   Carotid US 5/17: Bilateral ICA 1-39  . Cerebrovascular accident (stroke) ([place])    a. 12/2015 - cryptogenic.  S/P MDT Linq.  . Difficulty walking   . Essential hypertension   . Gait disorder 04/07/2016  . Heart palpitations 09/06/2012  . History of CVA with residual deficit 12/14/2015  . History of dizziness   . History of echocardiogram    Echo 10/18: Vigorous LVF, EF 65-70, normal wall motion, grade 1 diastolic dysfunction, GLS -21.1%, mild RAE  . History of nuclear stress test    Nuclear stress test 10/18: EF 69, inf-lat defect c/w poss infarct with peri-infarct ischemia; Intermediate Risk  . Hyperlipidemia   . Hypothyroidism   . Left hemiparesis ([place])   . LVH (left ventricular hypertrophy)   . Mitral regurgitation    a. 12/2015 Echo: EF 60-65%, mild focal basal hypertrophy. No rwma, triv AI, mild MR, mildly dil LA w/o thrombus. No RA thrombus. No PFO.  Marland Kitchen Palpitations   . Prediabetes   . Stroke-like symptom 12/13/2015  . SVT (supraventricular tachycardia) ([place]) 12/14/2015  . Vascular parkinsonism ([place])  06/29/2016  . Vitamin D deficiency     Past Surgical History:  Procedure Laterality Date  . CARDIOVASCULAR STRESS TEST  2002   NORMAL  . EP IMPLANTABLE DEVICE N/A 12/17/2015   Procedure: Loop Recorder Insertion;  Surgeon: [name], MD;  Location: [place];  Service: Cardiovascular;  Laterality: N/A;  . LEFT HEART CATH AND CORONARY ANGIOGRAPHY N/A 05/25/2017   Procedure: LEFT HEART CATH AND CORONARY ANGIOGRAPHY;  Surgeon: Sherren Mocha, MD;  Location: [facility];  Service: Cardiovascular;  Laterality: N/A;  . LOOP RECORDER IMPLANT    . TEE WITHOUT CARDIOVERSION N/A 12/17/2015   Procedure: TRANSESOPHAGEAL ECHOCARDIOGRAM (TEE);  Surgeon: Lelon Perla, MD;  Location: [facility] ENDOSCOPY;  Service: Cardiovascular;  Laterality: N/A;  Pt also needs a LOOP  . TRANSTHORACIC ECHOCARDIOGRAM  2008   SHOWED LEFT VENTRICULAR HYPERTROPHY AND MILD AORTIC STENOSIS    There were no vitals filed for this visit.  Subjective Assessment - 04/12/18 1404    Subjective  dizzy and left leg not moving well today    Currently in Pain?  No/denies                       [facility] Adult PT Treatment/Exercise - 04/12/18 0001      High Level Balance   High Level Balance Activities  Negotitating around obstacles;Negotiating over obstacles    High Level  Balance Comments  ball toss on airex- min A needed for posterior lean      Knee/Hip Exercises: Aerobic   Nustep  L 5 6 min      Knee/Hip Exercises: Machines for Strengthening   Cybex Knee Extension  10lbs 2x10 SL 5lb 1x10    Cybex Knee Flexion  20# 2 sets 10, SL 10# 10x     Cybex Leg Press  30lb 3x10      Knee/Hip Exercises: Standing   Other Standing Knee Exercises  HHA on airex marching, hip flex, hip abd and hip ext alt 20               PT Short Term Goals - 01/27/18 1246      PT SHORT TERM GOAL #1   Title  independent with her home HEP    Baseline  balance at counter marching fwd/back,side stepping and tandem     Time  2    Period  Weeks    Status  On-going        PT Long Term Goals - 04/07/18 7829      PT LONG TERM GOAL #4   Title  be able to do a sit to stand without assistance and without losing balance for functional use    Baseline  pt still slow with STS and initial balance with when starting gait    Status  Partially Met            Plan - 04/12/18 1438    Clinical Impression Statement  decreased balance today esp psterior LOB. HHA needed today with all gait d/t dizziness and decreased mvmt LE.    PT Treatment/Interventions  Stair training;Gait training;Functional mobility training;Therapeutic activities;Therapeutic exercise;Balance training;Patient/family education    PT Next Visit Plan  D/C thursday and transition to independant ex program       Patient will benefit from skilled therapeutic intervention in order to improve the following deficits and impairments:  Abnormal gait, Decreased strength, Decreased balance, Difficulty walking, Decreased activity tolerance, Decreased coordination, Decreased mobility  Visit Diagnosis: Muscle weakness (generalized)  Difficulty in walking, not elsewhere classified  Balance problem     Problem List Patient Active Problem List   Diagnosis Date Noted  . Familial hypercholesteremia 02/21/2018  . Psychogenic gait 10/21/2017  . Coronary artery disease involving native coronary artery of native heart without angina pectoris 06/09/2017  . Abnormal nuclear cardiac imaging test 05/25/2017  . PVC's (premature ventricular contractions) 05/17/2017  . Anxiety 12/29/2016  . Vascular parkinsonism ([place]) 06/29/2016  . Gait disorder 04/07/2016  . Intracranial carotid stenosis, bilateral 02/27/2016  . Essential hypertension   . Left-sided weakness 12/28/2015  . Headache 12/28/2015  . Adjustment reaction with anxiety 12/20/2015  . Left hemiparesis ([place])   . History of CVA with residual deficit   . Prediabetes   . SVT (supraventricular  tachycardia) ([place]) 12/14/2015  . Stroke-like symptom 12/13/2015  . History of stroke   . Heart palpitations 09/06/2012  . Hypertension   . Familial hyperlipidemia   . Hypothyroidism   . Vitamin D deficiency   . Palpitations   . History of dizziness   . LVH (left ventricular hypertrophy)   . Aortic stenosis   . Mitral regurgitation   . SOB (shortness of breath)   . Difficulty walking     PAYSEUR,ANGIE PTA 04/12/2018, 2:40 PM  [place], [place], [address] Phone: [phone]   Fax:  [phone]  Name: Stacey Ramirez MRN: [id number] Date of Birth: 07/16/1941

## 2018-04-12 NOTE — Telephone Encounter (Signed)
Referral placed to [facility]. Pt's husband instructed to cancel appt with [facility] if they are able to get in with [facility] sooner. He understands.

## 2018-04-14 ENCOUNTER — Ambulatory Visit: Payer: PPO | Admitting: Physical Therapy

## 2018-04-14 ENCOUNTER — Encounter: Payer: Self-pay | Admitting: Physical Therapy

## 2018-04-14 DIAGNOSIS — R262 Difficulty in walking, not elsewhere classified: Secondary | ICD-10-CM

## 2018-04-14 DIAGNOSIS — R2689 Other abnormalities of gait and mobility: Secondary | ICD-10-CM

## 2018-04-14 DIAGNOSIS — M6281 Muscle weakness (generalized): Secondary | ICD-10-CM

## 2018-04-14 NOTE — Therapy (Signed)
[place], [place], [address] Phone: [phone]   Fax:  [phone]  Physical Therapy Treatment  Patient Details  Name: Stacey Ramirez MRN: [id number] Date of Birth: [birth year] Referring Provider: Shelia Media   Encounter Date: 04/14/2018  PT End of Session - 04/14/18 1325    Visit Number  [id number]    Date for PT Re-Evaluation  04/15/18    PT Start Time  1310    PT Stop Time  1400    PT Time Calculation (min)  50 min       Past Medical History:  Diagnosis Date  . Adjustment reaction with anxiety 12/20/2015  . Aortic stenosis   . CAD (coronary artery disease) 06/09/2017   Nuc study 10/18: EF 69, inferolateral perfusion defect-possible small infarct with peri-infarct ischemia; intermediate risk // LHC 10/18: pLAD 25, pLCx 50, pRCA 25 >> med Rx  . Carotid stenosis, asymptomatic, bilateral 02/27/2016   Carotid US 5/17: Bilateral ICA 1-39  . Cerebrovascular accident (stroke) ([place])    a. 12/2015 - cryptogenic.  S/P MDT Linq.  . Difficulty walking   . Essential hypertension   . Gait disorder 04/07/2016  . Heart palpitations 09/06/2012  . History of CVA with residual deficit 12/14/2015  . History of dizziness   . History of echocardiogram    Echo 10/18: Vigorous LVF, EF 65-70, normal wall motion, grade 1 diastolic dysfunction, GLS -21.1%, mild RAE  . History of nuclear stress test    Nuclear stress test 10/18: EF 69, inf-lat defect c/w poss infarct with peri-infarct ischemia; Intermediate Risk  . Hyperlipidemia   . Hypothyroidism   . Left hemiparesis ([place])   . LVH (left ventricular hypertrophy)   . Mitral regurgitation    a. 12/2015 Echo: EF 60-65%, mild focal basal hypertrophy. No rwma, triv AI, mild MR, mildly dil LA w/o thrombus. No RA thrombus. No PFO.  Marland Kitchen Palpitations   . Prediabetes   . Stroke-like symptom 12/13/2015  . SVT (supraventricular tachycardia) ([place]) 12/14/2015  . Vascular parkinsonism ([place])  06/29/2016  . Vitamin D deficiency     Past Surgical History:  Procedure Laterality Date  . CARDIOVASCULAR STRESS TEST  2002   NORMAL  . EP IMPLANTABLE DEVICE N/A 12/17/2015   Procedure: Loop Recorder Insertion;  Surgeon: [name], MD;  Location: [facility];  Service: Cardiovascular;  Laterality: N/A;  . LEFT HEART CATH AND CORONARY ANGIOGRAPHY N/A 05/25/2017   Procedure: LEFT HEART CATH AND CORONARY ANGIOGRAPHY;  Surgeon: Sherren Mocha, MD;  Location: [facility];  Service: Cardiovascular;  Laterality: N/A;  . LOOP RECORDER IMPLANT    . TEE WITHOUT CARDIOVERSION N/A 12/17/2015   Procedure: TRANSESOPHAGEAL ECHOCARDIOGRAM (TEE);  Surgeon: Lelon Perla, MD;  Location: [facility] ENDOSCOPY;  Service: Cardiovascular;  Laterality: N/A;  Pt also needs a LOOP  . TRANSTHORACIC ECHOCARDIOGRAM  2008   SHOWED LEFT VENTRICULAR HYPERTROPHY AND MILD AORTIC STENOSIS    There were no vitals filed for this visit.  Subjective Assessment - 04/14/18 1310    Subjective  doing pretty good    Currently in Pain?  No/denies                       [facility] Adult PT Treatment/Exercise - 04/14/18 0001      Knee/Hip Exercises: Aerobic   Recumbent Bike  L 1 61mn    Nustep  L 5 7 min  Knee/Hip Exercises: Machines for Strengthening   Cybex Knee Extension  10lbs 2x10 SL 5lb 1x10    Cybex Knee Flexion  20# 2 sets 10, SL 10# 10x     Cybex Leg Press  30lb 3x10      Knee/Hip Exercises: Standing   Heel Raises  Both;20 reps    Other Standing Knee Exercises  HHA on airex marching, hip flex, hip abd and hip ext alt 20    Other Standing Knee Exercises  6 inch step up 10 each      Knee/Hip Exercises: Seated   Sit to Sand  2 sets;10 reps;without UE support   wt ball            PT Education - 04/14/18 1325    Education provided  Yes    Education Details  edcu pt and spouse on equipment for transition to independant gym program    Person(s) Educated  Patient    Methods   Explanation;Demonstration;Handout    Comprehension  Verbalized understanding;Returned demonstration       PT Short Term Goals - 01/27/18 1246      PT SHORT TERM GOAL #1   Title  independent with her home HEP    Baseline  balance at counter marching fwd/back,side stepping and tandem    Time  2    Period  Weeks    Status  On-going        PT Long Term Goals - 04/14/18 1325      PT LONG TERM GOAL #4   Title  be able to do a sit to stand without assistance and without losing balance for functional use    Baseline  pt still slow with STS and initial balance with when starting gait    Status  Partially Met            Plan - 04/14/18 1326    Clinical Impression Statement  all goals met exept still inconsistant with STS balance and initial gait progression. educ pt an ddpsouse today on equipment set up for transition to independant gym program    PT Treatment/Interventions  Stair training;Gait training;Functional mobility training;Therapeutic activities;Therapeutic exercise;Balance training;Patient/family education    PT Next Visit Plan  D/C       Patient will benefit from skilled therapeutic intervention in order to improve the following deficits and impairments:  Abnormal gait, Decreased strength, Decreased balance, Difficulty walking, Decreased activity tolerance, Decreased coordination, Decreased mobility  Visit Diagnosis: Muscle weakness (generalized)  Difficulty in walking, not elsewhere classified  Balance problem     Problem List Patient Active Problem List   Diagnosis Date Noted  . Familial hypercholesteremia 02/21/2018  . Psychogenic gait 10/21/2017  . Coronary artery disease involving native coronary artery of native heart without angina pectoris 06/09/2017  . Abnormal nuclear cardiac imaging test 05/25/2017  . PVC's (premature ventricular contractions) 05/17/2017  . Anxiety 12/29/2016  . Vascular parkinsonism ([place]) 06/29/2016  . Gait disorder 04/07/2016   . Intracranial carotid stenosis, bilateral 02/27/2016  . Essential hypertension   . Left-sided weakness 12/28/2015  . Headache 12/28/2015  . Adjustment reaction with anxiety 12/20/2015  . Left hemiparesis ([place])   . History of CVA with residual deficit   . Prediabetes   . SVT (supraventricular tachycardia) ([place]) 12/14/2015  . Stroke-like symptom 12/13/2015  . History of stroke   . Heart palpitations 09/06/2012  . Hypertension   . Familial hyperlipidemia   . Hypothyroidism   . Vitamin D deficiency   . Palpitations   .  History of dizziness   . LVH (left ventricular hypertrophy)   . Aortic stenosis   . Mitral regurgitation   . SOB (shortness of breath)   . Difficulty walking    PHYSICAL THERAPY DISCHARGE SUMMARY   Plan: Patient agrees to discharge.  Patient goals were partially met. Patient is being discharged due to meeting the stated rehab goals.  ?????      Yamileth Hayse,ANGIEPTA 04/14/2018, 1:28 PM  [place], [place], [address] Phone: [phone]   Fax:  [phone]  Name: ONIYAH ROHE MRN: [id number] Date of Birth: 09-23-1940

## 2018-04-15 ENCOUNTER — Telehealth (INDEPENDENT_AMBULATORY_CARE_PROVIDER_SITE_OTHER): Payer: Self-pay | Admitting: Radiology

## 2018-04-15 NOTE — Telephone Encounter (Signed)
Patients husband called to check status of EMG referral by Dr. Jonelle Sports.  I lmom that Dr. Kennon Portela usual assistants were not in office this afternoon and I could not check the status.  Please give him a call back on Monday with status.  cb # [id number] cellphone

## 2018-04-18 ENCOUNTER — Other Ambulatory Visit: Payer: Self-pay | Admitting: *Deleted

## 2018-04-18 DIAGNOSIS — R200 Anesthesia of skin: Secondary | ICD-10-CM

## 2018-04-18 NOTE — Telephone Encounter (Signed)
Spoke with Haynes Dage at [facility] and she states that Dr. Posey Pronto had an appt tomorrow, 04/19/17. Referral has been made to Dr. Posey Pronto office.

## 2018-04-19 ENCOUNTER — Ambulatory Visit (INDEPENDENT_AMBULATORY_CARE_PROVIDER_SITE_OTHER): Payer: PPO | Admitting: Neurology

## 2018-04-19 DIAGNOSIS — M5417 Radiculopathy, lumbosacral region: Secondary | ICD-10-CM

## 2018-04-19 DIAGNOSIS — R200 Anesthesia of skin: Secondary | ICD-10-CM

## 2018-04-19 NOTE — Procedures (Signed)
[facility]  [place]  [place], [place] [address] Tel: [phone] Fax:  [phone] Test Date:  04/19/2018  Patient: Stacey Ramirez DOB: [birth year] Physician: Narda Amber, DO  Sex: Female Height: 5\' 2"  Ref Phys: Lilia Argue, DO  ID#: [id number] Temp: 33.0C Technician:    Patient Complaints: This is a 77 year-old female with history of stroke in 2017 with residual left hemiparesis referred for evaluation of left hip flexion weakness.  NCV & EMG Findings: Extensive electrodiagnostic testing of the left lower extremity shows:  1. Left sural and superficial peroneal sensory responses are within normal limits. 2. Left peroneal motor response shows reduced amplitude at the extensor digitorum brevis, and is normal at the tibialis anterior. Left tibial and femoral motor responses are within normal limits. 3. Left tibial H reflex study is within normal limits. 4. Chronic motor axon loss changes are seen affecting the L4-L5 myotomes, without accompanied active denervation.  Impression: 1. Chronic L4-L5 radiculopathy affecting the left lower extremity, mild in degree electrically. 2. There is no evidence of a left femoral neuropathy, lumbosacral plexopathy, or sensorimotor polyneuropathy.   ___________________________ Narda Amber, DO    Nerve Conduction Studies Anti Sensory Summary Table   Stim Site NR Peak (ms) Norm Peak (ms) P-T Amp (V) Norm P-T Amp  Left Sup Peroneal Anti Sensory (Ant Lat Mall)  33C  12 cm    2.5 <4.6 5.1 >3  Left Sural Anti Sensory (Lat Mall)  33C  Calf    2.8 <4.6 7.2 >3   Motor Summary Table   Stim Site NR Onset (ms) Norm Onset (ms) O-P Amp (mV) Norm O-P Amp Site1 Site2 Delta-0 (ms) Dist (cm) Vel (m/s) Norm Vel (m/s)  Left Femoral Motor (Vastus Med)  33C  Abv Ing Lig    3.9 <6.5 5.5 >3        Below Ing Lig    4.4  4.7         Left Peroneal Motor (Ext Dig Brev)  33C  Ankle    5.0 <6.0 1.5 >2.5 B Fib Ankle 7.7 37.0 48 >40    B Fib    12.7  1.4  Poplt B Fib 1.5 8.0 53 >40  Poplt    14.2  1.1         Left Peroneal TA Motor (Tib Ant)  33C  Fib Head    2.7 <4.5 4.1 >3 Poplit Fib Head 1.3 8.0 62 >40  Poplit    4.0  3.6         Left Tibial Motor (Abd Hall Brev)  33C  Ankle    4.1 <6.0 8.8 >4 Knee Ankle 9.1 36.0 40 >40  Knee    13.2  8.4          H Reflex Studies   NR H-Lat (ms) Lat Norm (ms) L-R H-Lat (ms)  Left Tibial (Gastroc)  33C     31.56 <35    EMG   Side Muscle Ins Act Fibs Psw Fasc Number Recrt Dur Dur. Amp Amp. Poly Poly. Comment  Left AntTibialis Nml Nml Nml Nml 1- Rapid Some 1+ Some 1+ Some 1+ N/A  Left Gastroc Nml Nml Nml Nml Nml Nml Nml Nml Nml Nml Nml Nml N/A  Left Flex Dig Long Nml Nml Nml Nml 1- Rapid Some 1+ Some 1+ Some 1+ N/A  Left RectFemoris Nml Nml Nml Nml 1- Rapid Some 1+ Some 1+ Nml Nml N/A  Left GluteusMed Nml Nml Nml Nml  1- Rapid Some 1+ Some 1+ Some 1+ N/A  Left AdductorLong Nml Nml Nml Nml Nml Nml Nml Nml Nml Nml Nml Nml N/A  Left Lumbo Parasp Low Nml Nml Nml Nml NE - - - - - - Nml N/A      Waveforms:

## 2018-04-20 DIAGNOSIS — H2513 Age-related nuclear cataract, bilateral: Secondary | ICD-10-CM | POA: Diagnosis not present

## 2018-04-20 DIAGNOSIS — H04223 Epiphora due to insufficient drainage, bilateral lacrimal glands: Secondary | ICD-10-CM | POA: Diagnosis not present

## 2018-04-21 ENCOUNTER — Ambulatory Visit: Payer: PPO | Admitting: Adult Health

## 2018-04-22 ENCOUNTER — Telehealth: Payer: Self-pay | Admitting: Sports Medicine

## 2018-04-22 DIAGNOSIS — M5417 Radiculopathy, lumbosacral region: Secondary | ICD-10-CM

## 2018-04-22 NOTE — Addendum Note (Signed)
Addended by: Cyd Silence on: 04/22/2018 11:03 AM   Modules accepted: Orders

## 2018-04-22 NOTE — Telephone Encounter (Signed)
  I spoke with the patient and her husband on the phone today after reviewing her most recent EMG/nerve conduction studies.  She has chronic L4-L5 radiculopathy affecting the left lower extremity.  This may be the reason for her left lower extremity weakness.  I recommended that we get an MRI of her lumbar spine to evaluate further.  Patient and her husband are in agreement with this plan.  Phone follow-up with those results when available.  Of note, I have had a chance to review the extensive records from [facility] neurology.  Prior EMGs did not show L4-L5 radiculopathy so I think this is fairly recent.  To date, she has had MRIs of her cervical spine and her thoracic spine but no MRI of her lumbar spine.

## 2018-04-26 LAB — CUP PACEART REMOTE DEVICE CHECK
Implantable Pulse Generator Implant Date: 20170516
MDC IDC SESS DTM: 20190823140752

## 2018-04-27 ENCOUNTER — Ambulatory Visit (INDEPENDENT_AMBULATORY_CARE_PROVIDER_SITE_OTHER): Payer: PPO | Admitting: *Deleted

## 2018-04-27 DIAGNOSIS — I639 Cerebral infarction, unspecified: Secondary | ICD-10-CM | POA: Diagnosis not present

## 2018-04-28 ENCOUNTER — Ambulatory Visit
Admission: RE | Admit: 2018-04-28 | Discharge: 2018-04-28 | Disposition: A | Discharge status: Home / Self Care | Payer: PPO | Source: Ambulatory Visit | Attending: Sports Medicine | Admitting: Sports Medicine

## 2018-04-28 DIAGNOSIS — M545 Low back pain: Secondary | ICD-10-CM | POA: Diagnosis not present

## 2018-04-28 DIAGNOSIS — M5417 Radiculopathy, lumbosacral region: Secondary | ICD-10-CM

## 2018-04-28 NOTE — Progress Notes (Signed)
Carelink Summary Report / Loop Recorder 

## 2018-05-02 LAB — CUP PACEART REMOTE DEVICE CHECK
Date Time Interrogation Session: 20190925144120
Implantable Pulse Generator Implant Date: 20170516

## 2018-05-05 ENCOUNTER — Telehealth: Payer: Self-pay | Admitting: Sports Medicine

## 2018-05-05 NOTE — Telephone Encounter (Signed)
I spoke with Stacey Ramirez and her husband on the phone today after reviewing the MRI of her lumbar spine that was recently performed.  She has mild degenerative changes in her lumbar spine but nothing that would definitively explain the L4-L5 radiculopathy seen on her recent EMG/nerve conduction study.  In fact, that EMG showed that the radiculopathy was mild which would fit with her MRI findings but I am not convinced that this is the cause of her left leg weakness.  Her weakness actually seems to be centered more around the left hip.  Previous x-rays of the left hip showed no significant degenerative changes.  At this point, I recommend that they return to their neurologist Dr. Everette Rank in [facility] for further work up.  I cannot find a definitive orthopedic reason for her left leg weakness.  Follow-up with me as needed.

## 2018-05-10 DIAGNOSIS — G214 Vascular parkinsonism: Secondary | ICD-10-CM | POA: Diagnosis not present

## 2018-05-12 ENCOUNTER — Encounter: Payer: PPO | Admitting: Diagnostic Neuroimaging

## 2018-05-17 DIAGNOSIS — R0989 Other specified symptoms and signs involving the circulatory and respiratory systems: Secondary | ICD-10-CM | POA: Diagnosis not present

## 2018-05-17 DIAGNOSIS — G20 Parkinson's disease: Secondary | ICD-10-CM | POA: Diagnosis not present

## 2018-05-26 ENCOUNTER — Ambulatory Visit: Payer: PPO | Admitting: Internal Medicine

## 2018-05-30 ENCOUNTER — Ambulatory Visit (INDEPENDENT_AMBULATORY_CARE_PROVIDER_SITE_OTHER): Payer: PPO | Admitting: *Deleted

## 2018-05-30 DIAGNOSIS — I639 Cerebral infarction, unspecified: Secondary | ICD-10-CM

## 2018-05-31 NOTE — Progress Notes (Signed)
Carelink Summary Report / Loop Recorder 

## 2018-06-08 DIAGNOSIS — M791 Myalgia, unspecified site: Secondary | ICD-10-CM | POA: Diagnosis not present

## 2018-06-08 DIAGNOSIS — G20 Parkinson's disease: Secondary | ICD-10-CM | POA: Diagnosis not present

## 2018-06-08 DIAGNOSIS — I10 Essential (primary) hypertension: Secondary | ICD-10-CM | POA: Diagnosis not present

## 2018-06-20 ENCOUNTER — Other Ambulatory Visit: Payer: Self-pay | Admitting: Internal Medicine

## 2018-06-20 ENCOUNTER — Ambulatory Visit
Admission: RE | Admit: 2018-06-20 | Discharge: 2018-06-20 | Disposition: A | Discharge status: Home / Self Care | Payer: PPO | Source: Ambulatory Visit | Attending: Internal Medicine | Admitting: Internal Medicine

## 2018-06-20 DIAGNOSIS — T1490XA Injury, unspecified, initial encounter: Secondary | ICD-10-CM

## 2018-06-20 DIAGNOSIS — S0003XA Contusion of scalp, initial encounter: Secondary | ICD-10-CM | POA: Diagnosis not present

## 2018-06-20 DIAGNOSIS — S0990XA Unspecified injury of head, initial encounter: Secondary | ICD-10-CM | POA: Diagnosis not present

## 2018-06-20 DIAGNOSIS — Z7901 Long term (current) use of anticoagulants: Secondary | ICD-10-CM | POA: Diagnosis not present

## 2018-06-20 DIAGNOSIS — S0101XA Laceration without foreign body of scalp, initial encounter: Secondary | ICD-10-CM | POA: Diagnosis not present

## 2018-06-20 DIAGNOSIS — I10 Essential (primary) hypertension: Secondary | ICD-10-CM | POA: Diagnosis not present

## 2018-06-20 DIAGNOSIS — M160 Bilateral primary osteoarthritis of hip: Secondary | ICD-10-CM | POA: Diagnosis not present

## 2018-06-20 DIAGNOSIS — M25551 Pain in right hip: Secondary | ICD-10-CM | POA: Diagnosis not present

## 2018-06-21 DIAGNOSIS — R296 Repeated falls: Secondary | ICD-10-CM | POA: Diagnosis not present

## 2018-06-21 DIAGNOSIS — G20 Parkinson's disease: Secondary | ICD-10-CM | POA: Diagnosis not present

## 2018-06-21 DIAGNOSIS — Z7902 Long term (current) use of antithrombotics/antiplatelets: Secondary | ICD-10-CM | POA: Diagnosis not present

## 2018-06-21 DIAGNOSIS — W19XXXA Unspecified fall, initial encounter: Secondary | ICD-10-CM | POA: Diagnosis not present

## 2018-06-21 DIAGNOSIS — I10 Essential (primary) hypertension: Secondary | ICD-10-CM | POA: Diagnosis not present

## 2018-06-21 DIAGNOSIS — E785 Hyperlipidemia, unspecified: Secondary | ICD-10-CM | POA: Diagnosis not present

## 2018-06-21 DIAGNOSIS — R269 Unspecified abnormalities of gait and mobility: Secondary | ICD-10-CM | POA: Diagnosis not present

## 2018-06-21 DIAGNOSIS — I69354 Hemiplegia and hemiparesis following cerebral infarction affecting left non-dominant side: Secondary | ICD-10-CM | POA: Diagnosis not present

## 2018-06-21 DIAGNOSIS — Z8673 Personal history of transient ischemic attack (TIA), and cerebral infarction without residual deficits: Secondary | ICD-10-CM | POA: Diagnosis not present

## 2018-06-21 DIAGNOSIS — E119 Type 2 diabetes mellitus without complications: Secondary | ICD-10-CM | POA: Diagnosis not present

## 2018-06-21 DIAGNOSIS — G214 Vascular parkinsonism: Secondary | ICD-10-CM | POA: Diagnosis not present

## 2018-06-21 DIAGNOSIS — R531 Weakness: Secondary | ICD-10-CM | POA: Diagnosis not present

## 2018-06-22 DIAGNOSIS — M545 Low back pain: Secondary | ICD-10-CM | POA: Diagnosis not present

## 2018-06-22 DIAGNOSIS — S32000A Wedge compression fracture of unspecified lumbar vertebra, initial encounter for closed fracture: Secondary | ICD-10-CM | POA: Diagnosis not present

## 2018-06-22 LAB — CUP PACEART REMOTE DEVICE CHECK
Date Time Interrogation Session: 20191028143744
Implantable Pulse Generator Implant Date: 20170516

## 2018-06-23 DIAGNOSIS — S22000D Wedge compression fracture of unspecified thoracic vertebra, subsequent encounter for fracture with routine healing: Secondary | ICD-10-CM | POA: Diagnosis not present

## 2018-07-04 ENCOUNTER — Ambulatory Visit (INDEPENDENT_AMBULATORY_CARE_PROVIDER_SITE_OTHER): Payer: PPO

## 2018-07-04 DIAGNOSIS — I639 Cerebral infarction, unspecified: Secondary | ICD-10-CM | POA: Diagnosis not present

## 2018-07-04 NOTE — Progress Notes (Signed)
Carelink Summary Report / Loop Recorder 

## 2018-07-06 DIAGNOSIS — M4856XD Collapsed vertebra, not elsewhere classified, lumbar region, subsequent encounter for fracture with routine healing: Secondary | ICD-10-CM | POA: Diagnosis not present

## 2018-07-07 DIAGNOSIS — R9089 Other abnormal findings on diagnostic imaging of central nervous system: Secondary | ICD-10-CM | POA: Diagnosis not present

## 2018-07-07 DIAGNOSIS — R269 Unspecified abnormalities of gait and mobility: Secondary | ICD-10-CM | POA: Diagnosis not present

## 2018-07-11 DIAGNOSIS — G20 Parkinson's disease: Secondary | ICD-10-CM | POA: Diagnosis not present

## 2018-07-11 DIAGNOSIS — R269 Unspecified abnormalities of gait and mobility: Secondary | ICD-10-CM | POA: Diagnosis not present

## 2018-07-31 LAB — CUP PACEART REMOTE DEVICE CHECK
Date Time Interrogation Session: 20191130151009
Implantable Pulse Generator Implant Date: 20170516

## 2018-08-01 ENCOUNTER — Other Ambulatory Visit: Payer: Self-pay | Admitting: Internal Medicine

## 2018-08-01 LAB — CUP PACEART REMOTE DEVICE CHECK
Date Time Interrogation Session: 20191202050500
Implantable Pulse Generator Implant Date: 20170516

## 2018-08-04 DIAGNOSIS — M4856XD Collapsed vertebra, not elsewhere classified, lumbar region, subsequent encounter for fracture with routine healing: Secondary | ICD-10-CM | POA: Diagnosis not present

## 2018-08-04 LAB — CUP PACEART REMOTE DEVICE CHECK
Implantable Pulse Generator Implant Date: 20170516
MDC IDC SESS DTM: 20200102164101

## 2018-08-08 ENCOUNTER — Encounter (HOSPITAL_COMMUNITY): Payer: Self-pay | Admitting: Emergency Medicine

## 2018-08-08 ENCOUNTER — Emergency Department (HOSPITAL_COMMUNITY): Payer: PPO

## 2018-08-08 ENCOUNTER — Emergency Department (HOSPITAL_COMMUNITY)
Admission: EM | Admit: 2018-08-08 | Discharge: 2018-08-08 | Disposition: A | Discharge status: Home / Self Care | Payer: PPO | Attending: Emergency Medicine | Admitting: Emergency Medicine

## 2018-08-08 DIAGNOSIS — S161XXA Strain of muscle, fascia and tendon at neck level, initial encounter: Secondary | ICD-10-CM | POA: Diagnosis not present

## 2018-08-08 DIAGNOSIS — R0902 Hypoxemia: Secondary | ICD-10-CM | POA: Diagnosis not present

## 2018-08-08 DIAGNOSIS — Y929 Unspecified place or not applicable: Secondary | ICD-10-CM | POA: Diagnosis not present

## 2018-08-08 DIAGNOSIS — S0990XA Unspecified injury of head, initial encounter: Secondary | ICD-10-CM | POA: Diagnosis not present

## 2018-08-08 DIAGNOSIS — I10 Essential (primary) hypertension: Secondary | ICD-10-CM | POA: Insufficient documentation

## 2018-08-08 DIAGNOSIS — Y939 Activity, unspecified: Secondary | ICD-10-CM | POA: Insufficient documentation

## 2018-08-08 DIAGNOSIS — Y999 Unspecified external cause status: Secondary | ICD-10-CM | POA: Insufficient documentation

## 2018-08-08 DIAGNOSIS — M5489 Other dorsalgia: Secondary | ICD-10-CM | POA: Diagnosis not present

## 2018-08-08 DIAGNOSIS — R079 Chest pain, unspecified: Secondary | ICD-10-CM | POA: Diagnosis not present

## 2018-08-08 DIAGNOSIS — G214 Vascular parkinsonism: Secondary | ICD-10-CM | POA: Insufficient documentation

## 2018-08-08 DIAGNOSIS — E039 Hypothyroidism, unspecified: Secondary | ICD-10-CM | POA: Insufficient documentation

## 2018-08-08 DIAGNOSIS — Z79899 Other long term (current) drug therapy: Secondary | ICD-10-CM | POA: Insufficient documentation

## 2018-08-08 DIAGNOSIS — R58 Hemorrhage, not elsewhere classified: Secondary | ICD-10-CM | POA: Diagnosis not present

## 2018-08-08 DIAGNOSIS — S199XXA Unspecified injury of neck, initial encounter: Secondary | ICD-10-CM | POA: Diagnosis not present

## 2018-08-08 DIAGNOSIS — S0003XA Contusion of scalp, initial encounter: Secondary | ICD-10-CM | POA: Diagnosis not present

## 2018-08-08 DIAGNOSIS — S0101XA Laceration without foreign body of scalp, initial encounter: Secondary | ICD-10-CM

## 2018-08-08 DIAGNOSIS — S299XXA Unspecified injury of thorax, initial encounter: Secondary | ICD-10-CM | POA: Diagnosis not present

## 2018-08-08 DIAGNOSIS — W1812XA Fall from or off toilet with subsequent striking against object, initial encounter: Secondary | ICD-10-CM | POA: Insufficient documentation

## 2018-08-08 DIAGNOSIS — I2510 Atherosclerotic heart disease of native coronary artery without angina pectoris: Secondary | ICD-10-CM | POA: Diagnosis not present

## 2018-08-08 DIAGNOSIS — R52 Pain, unspecified: Secondary | ICD-10-CM | POA: Diagnosis not present

## 2018-08-08 DIAGNOSIS — W19XXXA Unspecified fall, initial encounter: Secondary | ICD-10-CM

## 2018-08-08 LAB — I-STAT CHEM 8, ED
BUN: 15 mg/dL (ref 8–23)
Calcium, Ion: 1.17 mmol/L (ref 1.15–1.40)
Chloride: 105 mmol/L (ref 98–111)
Creatinine, Ser: 0.6 mg/dL (ref 0.44–1.00)
Glucose, Bld: 171 mg/dL — ABNORMAL HIGH (ref 70–99)
HCT: 37 % (ref 36.0–46.0)
Hemoglobin: 12.6 g/dL (ref 12.0–15.0)
Potassium: 3.5 mmol/L (ref 3.5–5.1)
Sodium: 140 mmol/L (ref 135–145)
TCO2: 26 mmol/L (ref 22–32)

## 2018-08-08 LAB — I-STAT TROPONIN, ED
Troponin i, poc: 0 ng/mL (ref 0.00–0.08)
Troponin i, poc: 0 ng/mL (ref 0.00–0.08)

## 2018-08-08 LAB — CBC WITH DIFFERENTIAL/PLATELET
Abs Immature Granulocytes: 0.11 10*3/uL — ABNORMAL HIGH (ref 0.00–0.07)
BASOS PCT: 0 %
Basophils Absolute: 0.1 10*3/uL (ref 0.0–0.1)
EOS ABS: 0 10*3/uL (ref 0.0–0.5)
Eosinophils Relative: 0 %
HCT: 36.6 % (ref 36.0–46.0)
Hemoglobin: 11.8 g/dL — ABNORMAL LOW (ref 12.0–15.0)
Immature Granulocytes: 1 %
Lymphocytes Relative: 7 %
Lymphs Abs: 1.2 10*3/uL (ref 0.7–4.0)
MCH: 30.7 pg (ref 26.0–34.0)
MCHC: 32.2 g/dL (ref 30.0–36.0)
MCV: 95.3 fL (ref 80.0–100.0)
Monocytes Absolute: 1.1 10*3/uL — ABNORMAL HIGH (ref 0.1–1.0)
Monocytes Relative: 6 %
Neutro Abs: 15.8 10*3/uL — ABNORMAL HIGH (ref 1.7–7.7)
Neutrophils Relative %: 86 %
PLATELETS: 230 10*3/uL (ref 150–400)
RBC: 3.84 MIL/uL — ABNORMAL LOW (ref 3.87–5.11)
RDW: 12.8 % (ref 11.5–15.5)
WBC: 18.4 10*3/uL — ABNORMAL HIGH (ref 4.0–10.5)
nRBC: 0 % (ref 0.0–0.2)

## 2018-08-08 LAB — BASIC METABOLIC PANEL
Anion gap: 8 (ref 5–15)
BUN: 14 mg/dL (ref 8–23)
CO2: 22 mmol/L (ref 22–32)
Calcium: 9.3 mg/dL (ref 8.9–10.3)
Chloride: 110 mmol/L (ref 98–111)
Creatinine, Ser: 0.69 mg/dL (ref 0.44–1.00)
GFR calc Af Amer: >60 mL/min (ref >60)
Glucose, Bld: 183 mg/dL — ABNORMAL HIGH (ref 70–99)
Potassium: 3.6 mmol/L (ref 3.5–5.1)
Sodium: 140 mmol/L (ref 135–145)

## 2018-08-08 MED ORDER — ACETAMINOPHEN 325 MG PO TABS
650.0000 mg | ORAL_TABLET | Freq: Once | ORAL | Status: AC
  Administered 2018-08-08: 650 mg via ORAL
  Filled 2018-08-08: qty 2

## 2018-08-08 MED ORDER — NITROGLYCERIN 0.4 MG SL SUBL
0.4000 mg | SUBLINGUAL_TABLET | SUBLINGUAL | Status: AC | PRN
  Administered 2018-08-08: 0.4 mg via SUBLINGUAL
  Filled 2018-08-08: qty 1

## 2018-08-08 MED ORDER — ONDANSETRON HCL 4 MG/2ML IJ SOLN
4.0000 mg | Freq: Once | INTRAMUSCULAR | Status: AC
  Administered 2018-08-08: 4 mg via INTRAVENOUS
  Filled 2018-08-08: qty 2

## 2018-08-08 NOTE — ED Notes (Signed)
Pt  Verbalized understanding of discharge paperwork and follow-up care.

## 2018-08-08 NOTE — ED Triage Notes (Addendum)
Pt transported from home after fall while attempting to get up to go to [facility] @ 0200. Pt c/o neck/back pain with lac to back/top of the head, wound continues to ooze. Neck roll in place, IV est, denies LOC. Emesis x 1 @ 25 min after fall.  Pt does take Plavix

## 2018-08-08 NOTE — ED Provider Notes (Signed)
8:35 AM Assumed care from Dr. Stark Jock, please see their note for full history, physical and decision making until this point. In brief this is a 78 y.o. year old female who presented to the ED tonight with Fall     Fall with laceration to head which is very been repaired.  Also complained of chest tightness after being in the ER so pending delta troponins.  Also troponins negative.  Patient ambulating well.  Patient tolerating p.o. well.  Patient stable for discharge. Return in 7-10 days for staple removal. Concussion precautions reviewed.   Discharge instructions, including strict return precautions for new or worsening symptoms, given. Patient and/or family verbalized understanding and agreement with the plan as described.   Labs, studies and imaging reviewed by myself and considered in medical decision making if ordered. Imaging interpreted by radiology.  Labs Reviewed  BASIC METABOLIC PANEL - Abnormal; Notable for the following components:      Result Value   Glucose, Bld 183 (*)    All other components within normal limits  CBC WITH DIFFERENTIAL/PLATELET - Abnormal; Notable for the following components:   WBC 18.4 (*)    RBC 3.84 (*)    Hemoglobin 11.8 (*)    Neutro Abs 15.8 (*)    Monocytes Absolute 1.1 (*)    Abs Immature Granulocytes 0.11 (*)    All other components within normal limits  I-STAT CHEM 8, ED - Abnormal; Notable for the following components:   Glucose, Bld 171 (*)    All other components within normal limits  I-STAT TROPONIN, ED    DG Chest [facility] 1 View  Final Result    CT Head Wo Contrast  Final Result    CT Cervical Spine Wo Contrast  Final Result      No follow-ups on file.    Alyha Marines, Corene Cornea, MD 08/09/18 [phone]

## 2018-08-08 NOTE — Discharge Instructions (Addendum)
Staples are to be removed in 1 week.  Please follow-up with your primary doctor for this.  Return to the emergency department if you develop severe headache, pus draining from the wound, high fever, worsening balance, or other new and concerning symptoms.

## 2018-08-08 NOTE — ED Provider Notes (Addendum)
[facility] EMERGENCY DEPARTMENT Provider Note   CSN: [id number] Arrival date & time: 08/08/18  0272     History   Chief Complaint Chief Complaint  Patient presents with  . Fall    HPI Stacey Ramirez is a 78 y.o. female.  Patient is a 78 year old female with past medical history of coronary artery disease, prior CVA, hyperlipidemia.  She was brought by EMS after a fall.  She got up this evening to go to the bathroom.  She lost her balance and fell off the bedside commode and struck her head on the dresser.  She sustained a laceration to the left parietal region.  There was no reported loss of consciousness, but she does complain of headache and neck pain.  She denies any numbness or tingling.  The history is provided by the patient.  Fall  This is a new problem. The current episode started less than 1 hour ago. The problem occurs constantly. The problem has not changed since onset.Associated symptoms include headaches. Pertinent negatives include no chest pain and no shortness of breath. Nothing aggravates the symptoms. Nothing relieves the symptoms. She has tried nothing for the symptoms.    Past Medical History:  Diagnosis Date  . Adjustment reaction with anxiety 12/20/2015  . Aortic stenosis   . CAD (coronary artery disease) 06/09/2017   Nuc study 10/18: EF 69, inferolateral perfusion defect-possible small infarct with peri-infarct ischemia; intermediate risk // LHC 10/18: pLAD 25, pLCx 50, pRCA 25 >> med Rx  . Carotid stenosis, asymptomatic, bilateral 02/27/2016   Carotid US 5/17: Bilateral ICA 1-39  . Cerebrovascular accident (stroke) ([place])    a. 12/2015 - cryptogenic.  S/P MDT Linq.  . Difficulty walking   . Essential hypertension   . Gait disorder 04/07/2016  . Heart palpitations 09/06/2012  . History of CVA with residual deficit 12/14/2015  . History of dizziness   . History of echocardiogram    Echo 10/18: Vigorous LVF, EF 65-70, normal wall motion, grade 1  diastolic dysfunction, GLS -21.1%, mild RAE  . History of nuclear stress test    Nuclear stress test 10/18: EF 69, inf-lat defect c/w poss infarct with peri-infarct ischemia; Intermediate Risk  . Hyperlipidemia   . Hypothyroidism   . Left hemiparesis ([place])   . LVH (left ventricular hypertrophy)   . Mitral regurgitation    a. 12/2015 Echo: EF 60-65%, mild focal basal hypertrophy. No rwma, triv AI, mild MR, mildly dil LA w/o thrombus. No RA thrombus. No PFO.  Marland Kitchen Palpitations   . Prediabetes   . Stroke-like symptom 12/13/2015  . SVT (supraventricular tachycardia) ([place]) 12/14/2015  . Vascular parkinsonism ([place]) 06/29/2016  . Vitamin D deficiency     Patient Active Problem List   Diagnosis Date Noted  . Familial hypercholesteremia 02/21/2018  . Psychogenic gait 10/21/2017  . Coronary artery disease involving native coronary artery of native heart without angina pectoris 06/09/2017  . Abnormal nuclear cardiac imaging test 05/25/2017  . PVC's (premature ventricular contractions) 05/17/2017  . Anxiety 12/29/2016  . 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 Gait disorder 04/07/2016  . Intracranial carotid stenosis, bilateral 02/27/2016  . Essential hypertension   . Left-sided weakness 12/28/2015  . Headache 12/28/2015  . Adjustment reaction with anxiety 12/20/2015  . Left hemiparesis ([place])   . History of CVA with residual deficit   . Prediabetes   . SVT (supraventricular tachycardia) ([place]) 12/14/2015  . Stroke-like symptom 12/13/2015  . History of stroke   . Heart  palpitations 09/06/2012  . Hypertension   . Familial hyperlipidemia   . Hypothyroidism   . Vitamin D deficiency   . Palpitations   . History of dizziness   . LVH (left ventricular hypertrophy)   . Aortic stenosis   . Mitral regurgitation   . SOB (shortness of breath)   . Difficulty walking     Past Surgical History:  Procedure Laterality Date  . CARDIOVASCULAR STRESS TEST  2002   NORMAL  . EP IMPLANTABLE DEVICE  N/A 12/17/2015   Procedure: Loop Recorder Insertion;  Surgeon: [name], MD;  Location: [facility];  Service: Cardiovascular;  Laterality: N/A;  . LEFT HEART CATH AND CORONARY ANGIOGRAPHY N/A 05/25/2017   Procedure: LEFT HEART CATH AND CORONARY ANGIOGRAPHY;  Surgeon: Sherren Mocha, MD;  Location: [facility];  Service: Cardiovascular;  Laterality: N/A;  . LOOP RECORDER IMPLANT    . TEE WITHOUT CARDIOVERSION N/A 12/17/2015   Procedure: TRANSESOPHAGEAL ECHOCARDIOGRAM (TEE);  Surgeon: Lelon Perla, MD;  Location: [facility] ENDOSCOPY;  Service: Cardiovascular;  Laterality: N/A;  Pt also needs a LOOP  . TRANSTHORACIC ECHOCARDIOGRAM  2008   SHOWED LEFT VENTRICULAR HYPERTROPHY AND MILD AORTIC STENOSIS     OB History   No obstetric history on file.      Home Medications    Prior to Admission medications   Medication Sig Start Date End Date Taking? Authorizing Provider  ALPRAZolam Duanne Moron) 0.25 MG tablet Take 0.25 mg by mouth at bedtime as needed for anxiety or sleep.    [provider]  amLODipine (NORVASC) 5 MG tablet Take 1 tablet by mouth daily. 08/24/17   [provider]  carboxymethylcellulose (REFRESH PLUS) 0.5 % SOLN Place 1 drop into both eyes 3 (three) times daily as needed (DRY EYE).     [provider]  Cholecalciferol (VITAMIN D) 2000 units tablet Take 2,000 Units by mouth daily.    [provider]  clopidogrel (PLAVIX) 75 MG tablet Take 1 tablet (75 mg total) by mouth daily. 12/24/15   Love, Ivan Anchors, PA-C  irbesartan (AVAPRO) 300 MG tablet Take 1 tablet by mouth daily. 06/20/17   [provider]  levothyroxine (SYNTHROID, LEVOTHROID) 25 MCG tablet Take 25 mcg by mouth daily before breakfast.  09/21/14   [provider]  loratadine (CLARITIN) 10 MG tablet Take 10 mg by mouth daily as needed for allergies.     [provider]  LOTEMAX 0.5 % ophthalmic suspension Place 1 drop into both eyes 4 (four) times daily  as needed. For dry itchy eyes 02/17/13   [provider]  Multiple Vitamin (MULTIVITAMIN) tablet Take 1 tablet by mouth daily.    [provider]  sertraline (ZOLOFT) 50 MG tablet Take 50 mg by mouth daily.     [provider]    Family History Family History  Problem Relation Age of Onset  . Heart attack Mother   . Alzheimer's disease Mother   . Stroke Paternal Uncle     Social History Social History   Tobacco Use  . Smoking status: Never Smoker  . Smokeless tobacco: Never Used  Substance Use Topics  . Alcohol use: Yes    Alcohol/week: 1.0 standard drinks    Types: 1 Glasses of wine per week    Comment: socially  . Drug use: No     Allergies   Sulfa drugs cross reactors; Zocor [simvastatin]; Crestor [rosuvastatin calcium]; Statins; Sulfa antibiotics; and Micardis [telmisartan]   Review of Systems  Review of Systems  Respiratory: Negative for shortness of breath.   Cardiovascular: Negative for chest pain.  Neurological: Positive for headaches.  All other systems reviewed and are negative.    Physical Exam Updated Vital Signs BP (!) 151/68 (BP Location: Left Arm)   Pulse 82   Resp 18   Ht 5\' 2"  (1.575 m)   Wt 54.4 kg   SpO2 96%   BMI 21.95 kg/m   Physical Exam Vitals signs and nursing note reviewed.  Constitutional:      General: She is not in acute distress.    Appearance: She is well-developed. She is not diaphoretic.  HENT:     Head: Normocephalic.     Comments: There is a 7 cm laceration to the left parietal region.  There is arterial bleeding.    Right Ear: Tympanic membrane normal.     Left Ear: Tympanic membrane normal.  Eyes:     Extraocular Movements: Extraocular movements intact.     Pupils: Pupils are equal, round, and reactive to light.  Neck:     Musculoskeletal: Normal range of motion and neck supple.  Cardiovascular:     Rate and Rhythm: Normal rate and regular rhythm.     Heart sounds: No murmur. No friction  rub. No gallop.   Pulmonary:     Effort: Pulmonary effort is normal. No respiratory distress.     Breath sounds: Normal breath sounds. No wheezing.  Abdominal:     General: Bowel sounds are normal. There is no distension.     Palpations: Abdomen is soft.     Tenderness: There is no abdominal tenderness.  Musculoskeletal: Normal range of motion.     Comments: There is tenderness to palpation in the cervical region.  There is no tenderness in the thoracic or lumbar spine and no step-off.  Skin:    General: Skin is warm and dry.  Neurological:     General: No focal deficit present.     Mental Status: She is alert and oriented to person, place, and time.     Sensory: No sensory deficit.     Coordination: Coordination normal.      ED Treatments / Results  Labs (all labs ordered are listed, but only abnormal results are displayed) Labs Reviewed  BASIC METABOLIC PANEL  CBC WITH DIFFERENTIAL/PLATELET    EKG EKG Interpretation  Date/Time:  Monday August 08 2018 06:30:07 EST Ventricular Rate:  83 PR Interval:    QRS Duration: 97 QT Interval:  397 QTC Calculation: 467 R Axis:   74 Text Interpretation:  Sinus rhythm Abnormal R-wave progression, early transition No significant change since 5.12.2017 Confirmed by Veryl Speak [phone]) on 08/08/2018 6:42:12 AM   Radiology No results found.  Procedures Procedures (including critical care time)  Medications Ordered in ED Medications - No data to display   Initial Impression / Assessment and Plan / ED Course  I have reviewed the triage vital signs and the nursing notes.  Pertinent labs & imaging results that were available during my care of the patient were reviewed by me and considered in my medical decision making (see chart for details).  Patient presents here with complaints of fall.  She attempted to get up to go to the bathroom when she lost her balance and hit her head on the dresser.  She has a large laceration to the  scalp which was repaired as below.  She was complaining of neck pain and headache.  Imaging studies of these areas  were negative for fracture or intracranial hemorrhage.  Patient is neurologically intact and I see no indication for admission.  She will be discharged and is to return as needed for any problems.  LACERATION REPAIR Performed by: Veryl Speak Authorized by: Veryl Speak Consent: Verbal consent obtained. Risks and benefits: risks, benefits and alternatives were discussed Consent given by: patient Patient identity confirmed: provided demographic data Prepped and Draped in normal sterile fashion Wound explored  Laceration Location: left parietal scalp  Laceration Length: 7cm  No Foreign Bodies seen or palpated  Anesthesia: local infiltration  Local anesthetic: lidocaine 2% with epinephrine  Anesthetic total: 5 ml  Irrigation method: syringe Amount of cleaning: standard  Skin closure: staples  Number of staples: 8  Technique: staples  Patient tolerance: Patient tolerated the procedure well with no immediate complications.  While patient was receiving discharge instructions, she informed the nurse that she was now experiencing chest pressure.  This is located in the center of her chest and associated with nausea.  She denies any difficulty breathing.  EKG is unremarkable and troponin is negative.  Patient will undergo a delta troponin and further observation in the emergency department.  Care will be signed out to Dr. Dayna Barker at shift change.  He will observe the patient, obtain the results of the second troponin, and determine the final disposition.  Final Clinical Impressions(s) / ED Diagnoses   Final diagnoses:  None    ED Discharge Orders    None       Veryl Speak, MD 08/08/18 Reed Pandy    Veryl Speak, MD 08/09/18 [phone]

## 2018-08-08 NOTE — ED Notes (Signed)
Ambulated pt with walker. However needed a decent amount of assistance. Only could walk 2-4 steps. Pt stated she was feeling woozy.

## 2018-08-11 ENCOUNTER — Ambulatory Visit: Payer: PPO

## 2018-08-11 DIAGNOSIS — R269 Unspecified abnormalities of gait and mobility: Secondary | ICD-10-CM | POA: Diagnosis not present

## 2018-08-11 DIAGNOSIS — G20 Parkinson's disease: Secondary | ICD-10-CM | POA: Diagnosis not present

## 2018-08-15 ENCOUNTER — Telehealth: Payer: Self-pay | Admitting: Cardiovascular Disease

## 2018-08-15 ENCOUNTER — Encounter: Payer: Self-pay | Admitting: Physical Therapy

## 2018-08-15 ENCOUNTER — Other Ambulatory Visit: Payer: Self-pay

## 2018-08-15 ENCOUNTER — Ambulatory Visit: Payer: PPO | Attending: Specialist | Admitting: Physical Therapy

## 2018-08-15 DIAGNOSIS — G20 Parkinson's disease: Secondary | ICD-10-CM | POA: Diagnosis not present

## 2018-08-15 DIAGNOSIS — M545 Low back pain, unspecified: Secondary | ICD-10-CM

## 2018-08-15 DIAGNOSIS — R2689 Other abnormalities of gait and mobility: Secondary | ICD-10-CM | POA: Insufficient documentation

## 2018-08-15 DIAGNOSIS — M6281 Muscle weakness (generalized): Secondary | ICD-10-CM | POA: Insufficient documentation

## 2018-08-15 DIAGNOSIS — M62830 Muscle spasm of back: Secondary | ICD-10-CM | POA: Insufficient documentation

## 2018-08-15 DIAGNOSIS — R262 Difficulty in walking, not elsewhere classified: Secondary | ICD-10-CM | POA: Diagnosis not present

## 2018-08-15 DIAGNOSIS — G8194 Hemiplegia, unspecified affecting left nondominant side: Secondary | ICD-10-CM | POA: Insufficient documentation

## 2018-08-15 NOTE — Telephone Encounter (Signed)
I spoke with pt's husband.  He reports pt has been having pain in back which goes into chest. Started after fall on 08/08/18.  Pt was seen in ED and troponins were negative. Also having muscle pain in neck and shoulders.  Chest/back pain is worse with movement such as twisting and turning. Usually OK in the AM but worsens as the day goes on.  Pain improves some with tylenol.  Shortness of breath is the same as pt has had for a long time. Pt is seeing primary care tomorrow.  I advised husband to keep this appointment and let us know if primary care felt she needed appointment in our office sooner than scheduled follow up.

## 2018-08-15 NOTE — Therapy (Signed)
[place], [place], [address] Phone: [phone]   Fax:  [phone]  Physical Therapy Evaluation  Patient Details  Name: Stacey Ramirez MRN: [id number] Date of Birth: 07/01/1941 Referring Provider (PT): Beane   Encounter Date: 08/15/2018  PT End of Session - 08/15/18 1657    Visit Number  1    Date for PT Re-Evaluation  10/14/18    PT Start Time  1440    PT Stop Time  1530    PT Time Calculation (min)  50 min    Activity Tolerance  Patient tolerated treatment well    Behavior During Therapy  [facility] for tasks assessed/performed       Past Medical History:  Diagnosis Date  . Adjustment reaction with anxiety 12/20/2015  . Aortic stenosis   . CAD (coronary artery disease) 06/09/2017   Nuc study 10/18: EF 69, inferolateral perfusion defect-possible small infarct with peri-infarct ischemia; intermediate risk // LHC 10/18: pLAD 25, pLCx 50, pRCA 25 >> med Rx  . Carotid stenosis, asymptomatic, bilateral 02/27/2016   Carotid US 5/17: Bilateral ICA 1-39  . Cerebrovascular accident (stroke) ([place])    a. 12/2015 - cryptogenic.  S/P MDT Linq.  . Difficulty walking   . Essential hypertension   . Gait disorder 04/07/2016  . Heart palpitations 09/06/2012  . History of CVA with residual deficit 12/14/2015  . History of dizziness   . History of echocardiogram    Echo 10/18: Vigorous LVF, EF 65-70, normal wall motion, grade 1 diastolic dysfunction, GLS -21.1%, mild RAE  . History of nuclear stress test    Nuclear stress test 10/18: EF 69, inf-lat defect c/w poss infarct with peri-infarct ischemia; Intermediate Risk  . Hyperlipidemia   . Hypothyroidism   . Left hemiparesis ([place])   . LVH (left ventricular hypertrophy)   . Mitral regurgitation    a. 12/2015 Echo: EF 60-65%, mild focal basal hypertrophy. No rwma, triv AI, mild MR, mildly dil LA w/o thrombus. No RA thrombus. No PFO.  Marland Kitchen Palpitations   . Prediabetes   .  Stroke-like symptom 12/13/2015  . SVT (supraventricular tachycardia) ([place]) 12/14/2015  . 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 Vitamin D deficiency     Past Surgical History:  Procedure Laterality Date  . CARDIOVASCULAR STRESS TEST  2002   NORMAL  . EP IMPLANTABLE DEVICE N/A 12/17/2015   Procedure: Loop Recorder Insertion;  Surgeon: [name], MD;  Location: [place] [facility];  Service: Cardiovascular;  Laterality: N/A;  . LEFT HEART CATH AND CORONARY ANGIOGRAPHY N/A 05/25/2017   Procedure: LEFT HEART CATH AND CORONARY ANGIOGRAPHY;  Surgeon: Sherren Mocha, MD;  Location: [place] [facility];  Service: Cardiovascular;  Laterality: N/A;  . LOOP RECORDER IMPLANT    . TEE WITHOUT CARDIOVERSION N/A 12/17/2015   Procedure: TRANSESOPHAGEAL ECHOCARDIOGRAM (TEE);  Surgeon: Lelon Perla, MD;  Location: [facility] ENDOSCOPY;  Service: Cardiovascular;  Laterality: N/A;  Pt also needs a LOOP  . TRANSTHORACIC ECHOCARDIOGRAM  2008   SHOWED LEFT VENTRICULAR HYPERTROPHY AND MILD AORTIC STENOSIS    There were no vitals filed for this visit.   Subjective Assessment - 08/15/18 1447    Subjective  Pateitn was seen here previously for falls.  She was discharged 04/14/18.  She was doing the independent fitness program with her husband helping.  She reports that she had a fall prior to Thanksgiving that she sustained a compression fracture.  She tehn had a fall  on 08/08/18 and hit her head on a dresser that she still has stitches in her head.  Her and her husband reports that she was immobile for about 6 weeks to allow the fracture to heal, then this new fall occurred.      Patient is accompained by:  Family member    Pertinent History  Parkinson's, multiple falls    Limitations  Walking    Patient Stated Goals  decrease the falls    Currently in Pain?  Yes    Pain Score  3     Pain Location  Back    Pain Orientation  Upper;Mid;Lower    Pain Descriptors / Indicators  Sore;Aching    Pain Type  Acute pain     Pain Onset  1 to 4 weeks ago    Pain Frequency  Constant    Aggravating Factors   bending, turning, walking, activity pain up to 7/10    Pain Relieving Factors  two Tylenol, heat, pain can be a 2/10    Effect of Pain on Daily Activities  reports that she hurts to move         [facility] PT Assessment - 08/15/18 0001      Assessment   Medical Diagnosis  neck and back pain, frequent falls, difficulty walking, weakness    Referring Provider (PT)  Beane    Onset Date/Surgical Date  07/15/18    Prior Therapy  yes multiple times for falling      Precautions   Precautions  Fall      Balance Screen   Has the patient fallen in the past 6 months  Yes    How many times?  3    Has the patient had a decrease in activity level because of a fear of falling?   Yes    Is the patient reluctant to leave their home because of a fear of falling?   Yes      Home Environment   Additional Comments  uses 4WW around house, about 16 steps in the house, lives with husband, he cares for her and does most of the ADL's.      Prior Function   Level of Independence  Independent with household mobility with device    Vocation  Retired    Leisure  likes to go to [employer]      ROM / Strength   AROM / PROM / Strength  AROM;Strength      AROM   Overall AROM Comments  Cervical ROM flexion decreased 25%, rotation, sidebending and extension pain is decreased 75%, left shoulder ROM is limited due to RC tear, lumbar ROM was decreased 50% due to back pain, cervical motions caused pain      Strength   Overall Strength Comments  shoulder, hips, knees and ankles 3+/5 "feel very weak"      Palpation   Palpation comment  she has a lot of spasms and tnederness in the upper trap, rhomboids and the lumbar area, these mms are very tight      Ambulation/Gait   Gait Comments  uses a FWW, slow, smooth at times, with any turns she is very uncoordinated, with the left foot getting the festenation and at times will not be able to  pick it up.      Standardized Balance Assessment   Standardized Balance Assessment  Timed Up and Go Test;Berg Balance Test      Berg Balance Test   Sit to Stand  Able to stand using hands after several tries    Standing Unsupported  Able to stand 30 seconds unsupported    Sitting with Back Unsupported but Feet Supported on Floor or Stool  Able to sit safely and securely 2 minutes    Stand to Sit  Uses backs of legs against chair to control descent    Transfers  Able to transfer with verbal cueing and /or supervision    Standing Unsupported with Eyes Closed  Able to stand 10 seconds with supervision    Standing Ubsupported with Feet Together  Needs help to attain position but able to stand for 30 seconds with feet together    From Standing, Reach Forward with Outstretched Arm  Can reach forward >12 cm safely (5")    From Standing Position, Pick up Object from Floor  Unable to try/needs assist to keep balance    From Standing Position, Turn to Look Behind Over each Shoulder  Turn sideways only but maintains balance    Turn 360 Degrees  Needs close supervision or verbal cueing    Standing Unsupported, Alternately Place Feet on Step/Stool  Needs assistance to keep from falling or unable to try    Standing Unsupported, One Foot in [employer] balance while stepping or standing    Standing on One Leg  Unable to try or needs assist to prevent fall    Total Score  22      Timed Up and Go Test   Normal TUG (seconds)  52   using walker               Objective measurements completed on examination: See above findings.                PT Short Term Goals - 08/15/18 1703      PT SHORT TERM GOAL #1   Title  independent with her home HEP    Time  2    Period  Weeks    Status  New        PT Long Term Goals - 08/15/18 1703      PT LONG TERM GOAL #1   Title  decrease TUG time to 22 seconds for functional gait and safety    Baseline  52 seconds    Time  8    Period   Weeks    Status  New      PT LONG TERM GOAL #2   Title  increase Berg balance score to 40/56 for safety    Baseline  22/56    Time  8    Period  Weeks    Status  New      PT LONG TERM GOAL #3   Title  increase LE strength (particularly ABD and ext.) to 4/5 for functional gait and safety    Time  8    Period  Weeks    Status  New      PT LONG TERM GOAL #4   Title  be able to do a sit to stand without assistance and without losing balance for functional use    Time  8    Period  Weeks    Status  New             Plan - 08/15/18 1658    Clinical Impression Statement  Patient has been seen here previously multiple time, she usually does very well , her biggest issue has always been balance.  This incident for  today she had a fall in November and sustained a lumbar compression fracture, she was told to rest for 6 weeks, the husband reports that she became very week.  She then had a second fall 08/08/18 suffering a severe laceration of her scalp requiring 10-12 staples.  I can compare where she is now to exactly a year ago. TUG today was 52 seconds last year it was 22.  Berg balance was 22/56 last year it was 41/56.  This shows a drastic decline in function and balance.  Last year she did not use a device for ambulation, today she is using a FWW.  Her biggest issue with with turning or negotiating around or over obstacles, she has the left leg tha twill festinate and at times she is stuck and does not move.    History and Personal Factors relevant to plan of care:  CVA, Parkinson's, multiple falls    Clinical Presentation  Evolving    Clinical Decision Making  Moderate    Rehab Potential  Good    PT Frequency  2x / week    PT Duration  8 weeks    PT Treatment/Interventions  ADLs/Self Care Home Management;Electrical Stimulation;Moist Heat;Gait training;Stair training;Therapeutic activities;Functional mobility training;Therapeutic exercise;Patient/family education;Neuromuscular  re-education;Balance training    PT Next Visit Plan  slowly restart the balance and strength, may need to helpw tih mm spasms and pain    Consulted and Agree with Plan of Care  Patient       Patient will benefit from skilled therapeutic intervention in order to improve the following deficits and impairments:  Abnormal gait, Pain, Postural dysfunction, Increased muscle spasms, Cardiopulmonary status limiting activity, Decreased activity tolerance, Decreased strength, Difficulty walking, Decreased balance  Visit Diagnosis: Muscle weakness (generalized) - Plan: PT plan of care cert/re-cert  Difficulty in walking, not elsewhere classified - Plan: PT plan of care cert/re-cert  Balance problem - Plan: PT plan of care cert/re-cert  Left hemiparesis ([place]) - Plan: PT plan of care cert/re-cert  Parkinson disease ([place]) - Plan: PT plan of care cert/re-cert  Acute bilateral low back pain without sciatica - Plan: PT plan of care cert/re-cert  Muscle spasm of back - Plan: PT plan of care cert/re-cert     Problem List Patient Active Problem List   Diagnosis Date Noted  . Familial hypercholesteremia 02/21/2018  . Psychogenic gait 10/21/2017  . Coronary artery disease involving native coronary artery of native heart without angina pectoris 06/09/2017  . Abnormal nuclear cardiac imaging test 05/25/2017  . PVC's (premature ventricular contractions) 05/17/2017  . Anxiety 12/29/2016  . 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 Gait disorder 04/07/2016  . Intracranial carotid stenosis, bilateral 02/27/2016  . Essential hypertension   . Left-sided weakness 12/28/2015  . Headache 12/28/2015  . Adjustment reaction with anxiety 12/20/2015  . Left hemiparesis ([place])   . History of CVA with residual deficit   . Prediabetes   . SVT (supraventricular tachycardia) ([place]) 12/14/2015  . Stroke-like symptom 12/13/2015  . History of stroke   . Heart palpitations 09/06/2012  . Hypertension   . Familial  hyperlipidemia   . Hypothyroidism   . Vitamin D deficiency   . Palpitations   . History of dizziness   . LVH (left ventricular hypertrophy)   . Aortic stenosis   . Mitral regurgitation   . SOB (shortness of breath)   . Difficulty walking     Sumner Boast., PT 08/15/2018, 5:05 PM  [facility] [phone]  [facility] [place], [place], [address] Phone: [phone]   Fax:  [phone]  Name: Stacey Ramirez MRN: [id number] Date of Birth: [birth year]

## 2018-08-15 NOTE — Telephone Encounter (Signed)
New message     Pt c/o of Chest Pain: STAT if CP now or developed within 24 hours  1. Are you having CP right now? No   2. Are you experiencing any other symptoms (ex. SOB, nausea, vomiting, sweating)? Sob every now and then   3. How long have you been experiencing CP?  Since she fell last week   4. Is your CP continuous or coming and going? Comes and goes   5. Have you taken Nitroglycerin? No   Pt fell last week and think cp is due to that. Pt did go to er

## 2018-08-16 ENCOUNTER — Ambulatory Visit: Payer: PPO | Admitting: Physical Therapy

## 2018-08-16 DIAGNOSIS — R2689 Other abnormalities of gait and mobility: Secondary | ICD-10-CM

## 2018-08-16 DIAGNOSIS — M6281 Muscle weakness (generalized): Secondary | ICD-10-CM

## 2018-08-16 DIAGNOSIS — S0101XA Laceration without foreign body of scalp, initial encounter: Secondary | ICD-10-CM | POA: Diagnosis not present

## 2018-08-16 DIAGNOSIS — R262 Difficulty in walking, not elsewhere classified: Secondary | ICD-10-CM

## 2018-08-16 NOTE — Therapy (Signed)
[place], [place], [address] Phone: [phone]   Fax:  [phone]  Physical Therapy Treatment  Patient Details  Name: Stacey Ramirez MRN: [id number] Date of Birth: [birth year] Referring Provider (PT): Beane   Encounter Date: 08/16/2018  PT End of Session - 08/16/18 1448    Visit Number  2    Date for PT Re-Evaluation  10/14/18    PT Start Time  1400    PT Stop Time  5277    PT Time Calculation (min)  47 min       Past Medical History:  Diagnosis Date  . Adjustment reaction with anxiety 12/20/2015  . Aortic stenosis   . CAD (coronary artery disease) 06/09/2017   Nuc study 10/18: EF 69, inferolateral perfusion defect-possible small infarct with peri-infarct ischemia; intermediate risk // LHC 10/18: pLAD 25, pLCx 50, pRCA 25 >> med Rx  . Carotid stenosis, asymptomatic, bilateral 02/27/2016   Carotid US 5/17: Bilateral ICA 1-39  . Cerebrovascular accident (stroke) ([place])    a. 12/2015 - cryptogenic.  S/P MDT Linq.  . Difficulty walking   . Essential hypertension   . Gait disorder 04/07/2016  . Heart palpitations 09/06/2012  . History of CVA with residual deficit 12/14/2015  . History of dizziness   . History of echocardiogram    Echo 10/18: Vigorous LVF, EF 65-70, normal wall motion, grade 1 diastolic dysfunction, GLS -21.1%, mild RAE  . History of nuclear stress test    Nuclear stress test 10/18: EF 69, inf-lat defect c/w poss infarct with peri-infarct ischemia; Intermediate Risk  . Hyperlipidemia   . Hypothyroidism   . Left hemiparesis ([place])   . LVH (left ventricular hypertrophy)   . Mitral regurgitation    a. 12/2015 Echo: EF 60-65%, mild focal basal hypertrophy. No rwma, triv AI, mild MR, mildly dil LA w/o thrombus. No RA thrombus. No PFO.  Marland Kitchen Palpitations   . Prediabetes   . Stroke-like symptom 12/13/2015  . SVT (supraventricular tachycardia) ([place]) 12/14/2015  . Vascular parkinsonism ([place])  06/29/2016  . Vitamin D deficiency     Past Surgical History:  Procedure Laterality Date  . CARDIOVASCULAR STRESS TEST  2002   NORMAL  . EP IMPLANTABLE DEVICE N/A 12/17/2015   Procedure: Loop Recorder Insertion;  Surgeon: [name], MD;  Location: [place] [facility];  Service: Cardiovascular;  Laterality: N/A;  . LEFT HEART CATH AND CORONARY ANGIOGRAPHY N/A 05/25/2017   Procedure: LEFT HEART CATH AND CORONARY ANGIOGRAPHY;  Surgeon: Sherren Mocha, MD;  Location: [place] [facility];  Service: Cardiovascular;  Laterality: N/A;  . LOOP RECORDER IMPLANT    . TEE WITHOUT CARDIOVERSION N/A 12/17/2015   Procedure: TRANSESOPHAGEAL ECHOCARDIOGRAM (TEE);  Surgeon: Lelon Perla, MD;  Location: [facility] ENDOSCOPY;  Service: Cardiovascular;  Laterality: N/A;  Pt also needs a LOOP  . TRANSTHORACIC ECHOCARDIOGRAM  2008   SHOWED LEFT VENTRICULAR HYPERTROPHY AND MILD AORTIC STENOSIS    There were no vitals filed for this visit.  Subjective Assessment - 08/16/18 1415    Subjective  got staples removed this morning, neck, shlds and back sore    Currently in Pain?  Yes    Pain Score  4     Pain Location  Back    Pain Orientation  Upper;Lower;Mid                       [facility] Adult PT Treatment/Exercise - 08/16/18 0001  High Level Balance   High Level Balance Comments  standing on airex with head turns and func reaching working on balance and righting reaction       Exercises   Exercises  Shoulder;Lumbar;Knee/Hip      Lumbar Exercises: Aerobic   UBE (Upper Arm Bike)  l 1 2 fwd/2 back    Nustep  L 4 6 min      Lumbar Exercises: Standing   Other Standing Lumbar Exercises  3# shruggs and backward rolls 10 reps      Lumbar Exercises: Seated   Sit to Stand  15 reps   PTA assist     Knee/Hip Exercises: Machines for Strengthening   Cybex Knee Extension  5# 2 sets 10    Cybex Knee Flexion  15# 2 sets 10      Shoulder Exercises: Standing   Extension  Strengthening;10  reps;Theraband;Other (comment)    Theraband Level (Shoulder Extension)  Level 1 (Yellow)    Row  Strengthening;Both;10 reps;Theraband    Theraband Level (Shoulder Row)  Level 1 (Yellow)      Manual Therapy   Manual Therapy  Soft tissue mobilization;Passive ROM    Soft tissue mobilization  BIL cerv and trap, RT side tighter    Passive ROM  left shld gentle               PT Short Term Goals - 08/15/18 1703      PT SHORT TERM GOAL #1   Title  independent with her home HEP    Time  2    Period  Weeks    Status  New        PT Long Term Goals - 08/15/18 1703      PT LONG TERM GOAL #1   Title  decrease TUG time to 22 seconds for functional gait and safety    Baseline  52 seconds    Time  8    Period  Weeks    Status  New      PT LONG TERM GOAL #2   Title  increase Berg balance score to 40/56 for safety    Baseline  22/56    Time  8    Period  Weeks    Status  New      PT LONG TERM GOAL #3   Title  increase LE strength (particularly ABD and ext.) to 4/5 for functional gait and safety    Time  8    Period  Weeks    Status  New      PT LONG TERM GOAL #4   Title  be able to do a sit to stand without assistance and without losing balance for functional use    Time  8    Period  Weeks    Status  New            Plan - 08/16/18 1449    Clinical Impression Statement  pt tolerated initail ther ex progression well, cuing needed for control of mvmt and decrease compensation. pt amb with RW and needs btwn all equipment or HHA. focus on strengthening today as pt has been in bed most of last 8 weks. added in some STW to cerv and left shld PROM    PT Treatment/Interventions  ADLs/Self Care Home Management;Electrical Stimulation;Moist Heat;Gait training;Stair training;Therapeutic activities;Functional mobility training;Therapeutic exercise;Patient/family education;Neuromuscular re-education;Balance training    PT Next Visit Plan  assess and progress       Patient will  benefit from skilled therapeutic intervention in order to improve the following deficits and impairments:  Abnormal gait, Pain, Postural dysfunction, Increased muscle spasms, Cardiopulmonary status limiting activity, Decreased activity tolerance, Decreased strength, Difficulty walking, Decreased balance  Visit Diagnosis: Muscle weakness (generalized)  Difficulty in walking, not elsewhere classified  Balance problem     Problem List Patient Active Problem List   Diagnosis Date Noted  . Familial hypercholesteremia 02/21/2018  . Psychogenic gait 10/21/2017  . Coronary artery disease involving native coronary artery of native heart without angina pectoris 06/09/2017  . Abnormal nuclear cardiac imaging test 05/25/2017  . PVC's (premature ventricular contractions) 05/17/2017  . Anxiety 12/29/2016  . Vascular parkinsonism ([place]) 06/29/2016  . Gait disorder 04/07/2016  . Intracranial carotid stenosis, bilateral 02/27/2016  . Essential hypertension   . Left-sided weakness 12/28/2015  . Headache 12/28/2015  . Adjustment reaction with anxiety 12/20/2015  . Left hemiparesis ([place])   . History of CVA with residual deficit   . Prediabetes   . SVT (supraventricular tachycardia) ([place]) 12/14/2015  . Stroke-like symptom 12/13/2015  . History of stroke   . Heart palpitations 09/06/2012  . Hypertension   . Familial hyperlipidemia   . Hypothyroidism   . Vitamin D deficiency   . Palpitations   . History of dizziness   . LVH (left ventricular hypertrophy)   . Aortic stenosis   . Mitral regurgitation   . SOB (shortness of breath)   . Difficulty walking     Karion Cudd,ANGIE PTA 08/16/2018, 2:51 PM  [place], [place], [address] Phone: [phone]   Fax:  [phone]  Name: BRINDY HIGGINBOTHAM MRN: [id number] Date of Birth: Dec 08, 1940

## 2018-08-18 ENCOUNTER — Ambulatory Visit: Payer: PPO | Admitting: Physical Therapy

## 2018-08-18 DIAGNOSIS — E785 Hyperlipidemia, unspecified: Secondary | ICD-10-CM | POA: Diagnosis not present

## 2018-08-18 DIAGNOSIS — G20 Parkinson's disease: Secondary | ICD-10-CM

## 2018-08-18 DIAGNOSIS — E7800 Pure hypercholesterolemia, unspecified: Secondary | ICD-10-CM | POA: Diagnosis not present

## 2018-08-18 DIAGNOSIS — Z0001 Encounter for general adult medical examination with abnormal findings: Secondary | ICD-10-CM | POA: Diagnosis not present

## 2018-08-18 DIAGNOSIS — M6281 Muscle weakness (generalized): Secondary | ICD-10-CM

## 2018-08-18 DIAGNOSIS — E039 Hypothyroidism, unspecified: Secondary | ICD-10-CM | POA: Diagnosis not present

## 2018-08-18 DIAGNOSIS — I10 Essential (primary) hypertension: Secondary | ICD-10-CM | POA: Diagnosis not present

## 2018-08-18 DIAGNOSIS — R2689 Other abnormalities of gait and mobility: Secondary | ICD-10-CM

## 2018-08-18 DIAGNOSIS — R262 Difficulty in walking, not elsewhere classified: Secondary | ICD-10-CM

## 2018-08-18 DIAGNOSIS — R319 Hematuria, unspecified: Secondary | ICD-10-CM | POA: Diagnosis not present

## 2018-08-18 NOTE — Therapy (Signed)
[place], [place], [address] Phone: [phone]   Fax:  [phone]  Physical Therapy Treatment  Patient Details  Name: Stacey Ramirez MRN: [id number] Date of Birth: 1940/08/08 Referring Provider (PT): Beane   Encounter Date: 08/18/2018  PT End of Session - 08/18/18 1453    Visit Number  3    Date for PT Re-Evaluation  10/14/18    PT Start Time  1400    PT Stop Time  1448    PT Time Calculation (min)  48 min       Past Medical History:  Diagnosis Date  . Adjustment reaction with anxiety 12/20/2015  . Aortic stenosis   . CAD (coronary artery disease) 06/09/2017   Nuc study 10/18: EF 69, inferolateral perfusion defect-possible small infarct with peri-infarct ischemia; intermediate risk // LHC 10/18: pLAD 25, pLCx 50, pRCA 25 >> med Rx  . Carotid stenosis, asymptomatic, bilateral 02/27/2016   Carotid US 5/17: Bilateral ICA 1-39  . Cerebrovascular accident (stroke) ([place])    a. 12/2015 - cryptogenic.  S/P MDT Linq.  . Difficulty walking   . Essential hypertension   . Gait disorder 04/07/2016  . Heart palpitations 09/06/2012  . History of CVA with residual deficit 12/14/2015  . History of dizziness   . History of echocardiogram    Echo 10/18: Vigorous LVF, EF 65-70, normal wall motion, grade 1 diastolic dysfunction, GLS -21.1%, mild RAE  . History of nuclear stress test    Nuclear stress test 10/18: EF 69, inf-lat defect c/w poss infarct with peri-infarct ischemia; Intermediate Risk  . Hyperlipidemia   . Hypothyroidism   . Left hemiparesis ([place])   . LVH (left ventricular hypertrophy)   . Mitral regurgitation    a. 12/2015 Echo: EF 60-65%, mild focal basal hypertrophy. No rwma, triv AI, mild MR, mildly dil LA w/o thrombus. No RA thrombus. No PFO.  Marland Kitchen Palpitations   . Prediabetes   . Stroke-like symptom 12/13/2015  . SVT (supraventricular tachycardia) ([place]) 12/14/2015  . Vascular parkinsonism ([place])  06/29/2016  . Vitamin D deficiency     Past Surgical History:  Procedure Laterality Date  . CARDIOVASCULAR STRESS TEST  2002   NORMAL  . EP IMPLANTABLE DEVICE N/A 12/17/2015   Procedure: Loop Recorder Insertion;  Surgeon: [name], MD;  Location: [facility];  Service: Cardiovascular;  Laterality: N/A;  . LEFT HEART CATH AND CORONARY ANGIOGRAPHY N/A 05/25/2017   Procedure: LEFT HEART CATH AND CORONARY ANGIOGRAPHY;  Surgeon: Sherren Mocha, MD;  Location: [facility];  Service: Cardiovascular;  Laterality: N/A;  . LOOP RECORDER IMPLANT    . TEE WITHOUT CARDIOVERSION N/A 12/17/2015   Procedure: TRANSESOPHAGEAL ECHOCARDIOGRAM (TEE);  Surgeon: Lelon Perla, MD;  Location: [facility] ENDOSCOPY;  Service: Cardiovascular;  Laterality: N/A;  Pt also needs a LOOP  . TRANSTHORACIC ECHOCARDIOGRAM  2008   SHOWED LEFT VENTRICULAR HYPERTROPHY AND MILD AORTIC STENOSIS    There were no vitals filed for this visit.  Subjective Assessment - 08/18/18 1423    Subjective  felt okay after last session    Currently in Pain?  Yes    Pain Score  3     Pain Location  Back                       [facility] Adult PT Treatment/Exercise - 08/18/18 0001      Ambulation/Gait   Gait Comments  amb  with RW inside and out 400 feet CGA on uneven terrain and min A neg curb ( cuing for sequencing)  slow and shuffling with tunrs   fatigued by distance and SOB     Lumbar Exercises: Aerobic   UBE (Upper Arm Bike)  l 1 2 fwd/2 back    Nustep  L 4 6 min      Knee/Hip Exercises: Standing   Other Standing Knee Exercises  standing HHA 2 # ankle wts marching fwd,backward and side stepping 20 feet 2 times each    Other Standing Knee Exercises  standing on airex HHA 2# ALT LE for strength and balance- marching, hip flex,ext and abd,   HHA on airex 15 heel raises and mini squats     Manual Therapy   Manual Therapy  Passive ROM    Manual therapy comments  very guarded and pain limited with PROM     Passive ROM  left shld gentle               PT Short Term Goals - 08/15/18 1703      PT SHORT TERM GOAL #1   Title  independent with her home HEP    Time  2    Period  Weeks    Status  New        PT Long Term Goals - 08/15/18 1703      PT LONG TERM GOAL #1   Title  decrease TUG time to 22 seconds for functional gait and safety    Baseline  52 seconds    Time  8    Period  Weeks    Status  New      PT LONG TERM GOAL #2   Title  increase Berg balance score to 40/56 for safety    Baseline  22/56    Time  8    Period  Weeks    Status  New      PT LONG TERM GOAL #3   Title  increase LE strength (particularly ABD and ext.) to 4/5 for functional gait and safety    Time  8    Period  Weeks    Status  New      PT LONG TERM GOAL #4   Title  be able to do a sit to stand without assistance and without losing balance for functional use    Time  8    Period  Weeks    Status  New            Plan - 08/18/18 1454    Clinical Impression Statement  worked on endurance, strength and balance today. assistance needed outside with gait esp on curb, added alt LE ther ex on airex and required min A with HHA.     PT Treatment/Interventions  ADLs/Self Care Home Management;Electrical Stimulation;Moist Heat;Gait training;Stair training;Therapeutic activities;Functional mobility training;Therapeutic exercise;Patient/family education;Neuromuscular re-education;Balance training    PT Next Visit Plan  progress strength,gait and balance       Patient will benefit from skilled therapeutic intervention in order to improve the following deficits and impairments:     Visit Diagnosis: Muscle weakness (generalized)  Difficulty in walking, not elsewhere classified  Balance problem  Parkinson disease [facility])     Problem List Patient Active Problem List   Diagnosis Date Noted  . Familial hypercholesteremia 02/21/2018  . Psychogenic gait 10/21/2017  . Coronary artery disease  involving native coronary artery of native heart without angina pectoris 06/09/2017  .  Abnormal nuclear cardiac imaging test 05/25/2017  . PVC's (premature ventricular contractions) 05/17/2017  . Anxiety 12/29/2016  . Vascular parkinsonism ([place]) 06/29/2016  . Gait disorder 04/07/2016  . Intracranial carotid stenosis, bilateral 02/27/2016  . Essential hypertension   . Left-sided weakness 12/28/2015  . Headache 12/28/2015  . Adjustment reaction with anxiety 12/20/2015  . Left hemiparesis ([place])   . History of CVA with residual deficit   . Prediabetes   . SVT (supraventricular tachycardia) ([place]) 12/14/2015  . Stroke-like symptom 12/13/2015  . History of stroke   . Heart palpitations 09/06/2012  . Hypertension   . Familial hyperlipidemia   . Hypothyroidism   . Vitamin D deficiency   . Palpitations   . History of dizziness   . LVH (left ventricular hypertrophy)   . Aortic stenosis   . Mitral regurgitation   . SOB (shortness of breath)   . Difficulty walking     Coltin Casher,ANGIE PTA 08/18/2018, 2:57 PM  [place], [place], [address] Phone: [phone]   Fax:  [phone]  Name: Stacey Ramirez MRN: [id number] Date of Birth: 1941/03/10

## 2018-08-23 ENCOUNTER — Ambulatory Visit: Payer: PPO | Admitting: Physical Therapy

## 2018-08-23 DIAGNOSIS — Z7901 Long term (current) use of anticoagulants: Secondary | ICD-10-CM | POA: Diagnosis not present

## 2018-08-23 DIAGNOSIS — G20A1 Parkinson's disease without dyskinesia, without mention of fluctuations: Secondary | ICD-10-CM

## 2018-08-23 DIAGNOSIS — R262 Difficulty in walking, not elsewhere classified: Secondary | ICD-10-CM

## 2018-08-23 DIAGNOSIS — M6281 Muscle weakness (generalized): Secondary | ICD-10-CM

## 2018-08-23 DIAGNOSIS — I639 Cerebral infarction, unspecified: Secondary | ICD-10-CM | POA: Diagnosis not present

## 2018-08-23 DIAGNOSIS — R2689 Other abnormalities of gait and mobility: Secondary | ICD-10-CM

## 2018-08-23 DIAGNOSIS — I10 Essential (primary) hypertension: Secondary | ICD-10-CM | POA: Diagnosis not present

## 2018-08-23 DIAGNOSIS — S32010G Wedge compression fracture of first lumbar vertebra, subsequent encounter for fracture with delayed healing: Secondary | ICD-10-CM | POA: Diagnosis not present

## 2018-08-23 DIAGNOSIS — F329 Major depressive disorder, single episode, unspecified: Secondary | ICD-10-CM | POA: Diagnosis not present

## 2018-08-23 DIAGNOSIS — E7800 Pure hypercholesterolemia, unspecified: Secondary | ICD-10-CM | POA: Diagnosis not present

## 2018-08-23 DIAGNOSIS — E039 Hypothyroidism, unspecified: Secondary | ICD-10-CM | POA: Diagnosis not present

## 2018-08-23 DIAGNOSIS — G20 Parkinson's disease: Secondary | ICD-10-CM | POA: Diagnosis not present

## 2018-08-23 DIAGNOSIS — Z0000 Encounter for general adult medical examination without abnormal findings: Secondary | ICD-10-CM | POA: Diagnosis not present

## 2018-08-23 NOTE — Therapy (Signed)
[place], [place], [address] Phone: [phone]   Fax:  [phone]  Physical Therapy Treatment  Patient Details  Name: Stacey Ramirez MRN: [id number] Date of Birth: 10-Jul-1941 Referring Provider (PT): Beane   Encounter Date: 08/23/2018  PT End of Session - 08/23/18 1307    Visit Number  4    Date for PT Re-Evaluation  10/14/18    PT Start Time  1228    PT Stop Time  3154    PT Time Calculation (min)  49 min       Past Medical History:  Diagnosis Date  . Adjustment reaction with anxiety 12/20/2015  . Aortic stenosis   . CAD (coronary artery disease) 06/09/2017   Nuc study 10/18: EF 69, inferolateral perfusion defect-possible small infarct with peri-infarct ischemia; intermediate risk // LHC 10/18: pLAD 25, pLCx 50, pRCA 25 >> med Rx  . Carotid stenosis, asymptomatic, bilateral 02/27/2016   Carotid US 5/17: Bilateral ICA 1-39  . Cerebrovascular accident (stroke) ([place])    a. 12/2015 - cryptogenic.  S/P MDT Linq.  . Difficulty walking   . Essential hypertension   . Gait disorder 04/07/2016  . Heart palpitations 09/06/2012  . History of CVA with residual deficit 12/14/2015  . History of dizziness   . History of echocardiogram    Echo 10/18: Vigorous LVF, EF 65-70, normal wall motion, grade 1 diastolic dysfunction, GLS -21.1%, mild RAE  . History of nuclear stress test    Nuclear stress test 10/18: EF 69, inf-lat defect c/w poss infarct with peri-infarct ischemia; Intermediate Risk  . Hyperlipidemia   . Hypothyroidism   . Left hemiparesis ([place])   . LVH (left ventricular hypertrophy)   . Mitral regurgitation    a. 12/2015 Echo: EF 60-65%, mild focal basal hypertrophy. No rwma, triv AI, mild MR, mildly dil LA w/o thrombus. No RA thrombus. No PFO.  Marland Kitchen Palpitations   . Prediabetes   . Stroke-like symptom 12/13/2015  . SVT (supraventricular tachycardia) ([place]) 12/14/2015  . Vascular parkinsonism ([place])  06/29/2016  . Vitamin D deficiency     Past Surgical History:  Procedure Laterality Date  . CARDIOVASCULAR STRESS TEST  2002   NORMAL  . EP IMPLANTABLE DEVICE N/A 12/17/2015   Procedure: Loop Recorder Insertion;  Surgeon: [name], MD;  Location: [place];  Service: Cardiovascular;  Laterality: N/A;  . LEFT HEART CATH AND CORONARY ANGIOGRAPHY N/A 05/25/2017   Procedure: LEFT HEART CATH AND CORONARY ANGIOGRAPHY;  Surgeon: Sherren Mocha, MD;  Location: [facility];  Service: Cardiovascular;  Laterality: N/A;  . LOOP RECORDER IMPLANT    . TEE WITHOUT CARDIOVERSION N/A 12/17/2015   Procedure: TRANSESOPHAGEAL ECHOCARDIOGRAM (TEE);  Surgeon: Lelon Perla, MD;  Location: [facility] ENDOSCOPY;  Service: Cardiovascular;  Laterality: N/A;  Pt also needs a LOOP  . TRANSTHORACIC ECHOCARDIOGRAM  2008   SHOWED LEFT VENTRICULAR HYPERTROPHY AND MILD AORTIC STENOSIS    There were no vitals filed for this visit.  Subjective Assessment - 08/23/18 1238    Subjective  working on bike at home and stretching LEft UE " look how much better- hurts though"    Currently in Pain?  Yes    Pain Score  3     Pain Location  Back                       [facility] Adult PT Treatment/Exercise - 08/23/18 0001  High Level Balance   High Level Balance Activities  Negotitating around obstacles;Negotiating over obstacles    High Level Balance Comments  standing on airex with head turns and func reaching working on balance and righting reaction       Lumbar Exercises: Aerobic   UBE (Upper Arm Bike)  L 2 2 fwd/2 back    Recumbent Bike  5 min    Nustep  L 4 7 min    Other Aerobic Exercise  16 min of cardio to work to increase stamina      Knee/Hip Exercises: Machines for Strengthening   Cybex Knee Extension  5# 2 sets 12    Cybex Knee Flexion  15# 2 sets 12      Knee/Hip Exercises: Standing   Other Standing Knee Exercises  standing HHA 2 # ankle wts marching fwd,backward and side  stepping 20 feet 2 times each    Other Standing Knee Exercises  HHA 2# ALT LE for strength and balance- marching, hip flex,ext and abd,               PT Short Term Goals - 08/15/18 1703      PT SHORT TERM GOAL #1   Title  independent with her home HEP    Time  2    Period  Weeks    Status  New        PT Long Term Goals - 08/15/18 1703      PT LONG TERM GOAL #1   Title  decrease TUG time to 22 seconds for functional gait and safety    Baseline  52 seconds    Time  8    Period  Weeks    Status  New      PT LONG TERM GOAL #2   Title  increase Berg balance score to 40/56 for safety    Baseline  22/56    Time  8    Period  Weeks    Status  New      PT LONG TERM GOAL #3   Title  increase LE strength (particularly ABD and ext.) to 4/5 for functional gait and safety    Time  8    Period  Weeks    Status  New      PT LONG TERM GOAL #4   Title  be able to do a sit to stand without assistance and without losing balance for functional use    Time  8    Period  Weeks    Status  New            Plan - 08/23/18 1307    Clinical Impression Statement  16 min cardio without rest. fatigues with balance ex.Min A needed d/t LOB esp posterior- VCing to self correct. assistance needed btwn all machine for safety. difficulty still with turns ( shuffled and LOB)    PT Treatment/Interventions  ADLs/Self Care Home Management;Electrical Stimulation;Moist Heat;Gait training;Stair training;Therapeutic activities;Functional mobility training;Therapeutic exercise;Patient/family education;Neuromuscular re-education;Balance training    PT Next Visit Plan  progress strength,gait and balance       Patient will benefit from skilled therapeutic intervention in order to improve the following deficits and impairments:  Abnormal gait, Pain, Postural dysfunction, Increased muscle spasms, Cardiopulmonary status limiting activity, Decreased activity tolerance, Decreased strength, Difficulty  walking, Decreased balance  Visit Diagnosis: Muscle weakness (generalized)  Difficulty in walking, not elsewhere classified  Balance problem  Parkinson disease ([place])     Problem List Patient  Active Problem List   Diagnosis Date Noted  . Familial hypercholesteremia 02/21/2018  . Psychogenic gait 10/21/2017  . Coronary artery disease involving native coronary artery of native heart without angina pectoris 06/09/2017  . Abnormal nuclear cardiac imaging test 05/25/2017  . PVC's (premature ventricular contractions) 05/17/2017  . Anxiety 12/29/2016  . Vascular parkinsonism ([place]) 06/29/2016  . Gait disorder 04/07/2016  . Intracranial carotid stenosis, bilateral 02/27/2016  . Essential hypertension   . Left-sided weakness 12/28/2015  . Headache 12/28/2015  . Adjustment reaction with anxiety 12/20/2015  . Left hemiparesis ([place])   . History of CVA with residual deficit   . Prediabetes   . SVT (supraventricular tachycardia) ([place]) 12/14/2015  . Stroke-like symptom 12/13/2015  . History of stroke   . Heart palpitations 09/06/2012  . Hypertension   . Familial hyperlipidemia   . Hypothyroidism   . Vitamin D deficiency   . Palpitations   . History of dizziness   . LVH (left ventricular hypertrophy)   . Aortic stenosis   . Mitral regurgitation   . SOB (shortness of breath)   . Difficulty walking     Selene Peltzer,ANGIE PTA 08/23/2018, 1:09 PM  [place], [place], [address] Phone: [phone]   Fax:  [phone]  Name: MARINA BOERNER MRN: [id number] Date of Birth: 02-28-1941

## 2018-08-25 ENCOUNTER — Telehealth: Payer: Self-pay | Admitting: Cardiology

## 2018-08-25 ENCOUNTER — Ambulatory Visit: Payer: PPO | Admitting: Physical Therapy

## 2018-08-25 DIAGNOSIS — R2689 Other abnormalities of gait and mobility: Secondary | ICD-10-CM

## 2018-08-25 DIAGNOSIS — M6281 Muscle weakness (generalized): Secondary | ICD-10-CM

## 2018-08-25 DIAGNOSIS — G20 Parkinson's disease: Secondary | ICD-10-CM

## 2018-08-25 DIAGNOSIS — R262 Difficulty in walking, not elsewhere classified: Secondary | ICD-10-CM

## 2018-08-25 NOTE — Telephone Encounter (Signed)
Spoke w/ pt and requested that she send a manual transmission w/ her home monitor. Pt and her husband verbalized understanding.

## 2018-08-25 NOTE — Therapy (Signed)
[place], [place], [address] Phone: [phone]   Fax:  [phone]  Physical Therapy Treatment  Patient Details  Name: Stacey Ramirez MRN: [id number] Date of Birth: [birth year] Referring Provider (PT): Beane   Encounter Date: 08/25/2018  PT End of Session - 08/25/18 1228    Visit Number  5    Date for PT Re-Evaluation  10/14/18    PT Start Time  7989    PT Stop Time  1230    PT Time Calculation (min)  45 min       Past Medical History:  Diagnosis Date  . Adjustment reaction with anxiety 12/20/2015  . Aortic stenosis   . CAD (coronary artery disease) 06/09/2017   Nuc study 10/18: EF 69, inferolateral perfusion defect-possible small infarct with peri-infarct ischemia; intermediate risk // LHC 10/18: pLAD 25, pLCx 50, pRCA 25 >> med Rx  . Carotid stenosis, asymptomatic, bilateral 02/27/2016   Carotid US 5/17: Bilateral ICA 1-39  . Cerebrovascular accident (stroke) ([place])    a. 12/2015 - cryptogenic.  S/P MDT Linq.  . Difficulty walking   . Essential hypertension   . Gait disorder 04/07/2016  . Heart palpitations 09/06/2012  . History of CVA with residual deficit 12/14/2015  . History of dizziness   . History of echocardiogram    Echo 10/18: Vigorous LVF, EF 65-70, normal wall motion, grade 1 diastolic dysfunction, GLS -21.1%, mild RAE  . History of nuclear stress test    Nuclear stress test 10/18: EF 69, inf-lat defect c/w poss infarct with peri-infarct ischemia; Intermediate Risk  . Hyperlipidemia   . Hypothyroidism   . Left hemiparesis ([place])   . LVH (left ventricular hypertrophy)   . Mitral regurgitation    a. 12/2015 Echo: EF 60-65%, mild focal basal hypertrophy. No rwma, triv AI, mild MR, mildly dil LA w/o thrombus. No RA thrombus. No PFO.  Marland Kitchen Palpitations   . Prediabetes   . Stroke-like symptom 12/13/2015  . SVT (supraventricular tachycardia) ([place]) 12/14/2015  . Vascular parkinsonism ([place])  06/29/2016  . Vitamin D deficiency     Past Surgical History:  Procedure Laterality Date  . CARDIOVASCULAR STRESS TEST  2002   NORMAL  . EP IMPLANTABLE DEVICE N/A 12/17/2015   Procedure: Loop Recorder Insertion;  Surgeon: [name], MD;  Location: [facility];  Service: Cardiovascular;  Laterality: N/A;  . LEFT HEART CATH AND CORONARY ANGIOGRAPHY N/A 05/25/2017   Procedure: LEFT HEART CATH AND CORONARY ANGIOGRAPHY;  Surgeon: Sherren Mocha, MD;  Location: [facility];  Service: Cardiovascular;  Laterality: N/A;  . LOOP RECORDER IMPLANT    . TEE WITHOUT CARDIOVERSION N/A 12/17/2015   Procedure: TRANSESOPHAGEAL ECHOCARDIOGRAM (TEE);  Surgeon: Lelon Perla, MD;  Location: [facility] ENDOSCOPY;  Service: Cardiovascular;  Laterality: N/A;  Pt also needs a LOOP  . TRANSTHORACIC ECHOCARDIOGRAM  2008   SHOWED LEFT VENTRICULAR HYPERTROPHY AND MILD AORTIC STENOSIS    There were no vitals filed for this visit.  Subjective Assessment - 08/25/18 1147    Subjective  sore in hips after last session but now I think I am moving/walking better    Pain Score  2     Pain Location  Back                       [facility] Adult PT Treatment/Exercise - 08/25/18 0001      Ambulation/Gait   Gait Comments  amb with 2 canes to work on reciprocol gait and arm swing,, min A with cuing      Lumbar Exercises: Aerobic   UBE (Upper Arm Bike)  L 3 2 fwd/2 back    Recumbent Bike  6 min    Nustep  L 5 6 min      Lumbar Exercises: Seated   Sit to Stand  15 reps   PTA assist     Knee/Hip Exercises: Standing   Other Standing Knee Exercises  3# BIL LE stepping fwd and SW over foam roller 10 times each   CG A with difficulty clearing left LE   Other Standing Knee Exercises  HHA 3# ALT LE for strength and balance- marching, hip flex,ext and abd,             PT Education - 08/25/18 1153    Education Details  walking program and standing hip/balance ex at sink    Person(s) Educated   Patient    Methods  Explanation    Comprehension  Verbalized understanding       PT Short Term Goals - 08/25/18 1153      PT SHORT TERM GOAL #1   Title  independent with her home HEP    Status  Achieved        PT Long Term Goals - 08/15/18 1703      PT LONG TERM GOAL #1   Title  decrease TUG time to 22 seconds for functional gait and safety    Baseline  52 seconds    Time  8    Period  Weeks    Status  New      PT LONG TERM GOAL #2   Title  increase Berg balance score to 40/56 for safety    Baseline  22/56    Time  8    Period  Weeks    Status  New      PT LONG TERM GOAL #3   Title  increase LE strength (particularly ABD and ext.) to 4/5 for functional gait and safety    Time  8    Period  Weeks    Status  New      PT LONG TERM GOAL #4   Title  be able to do a sit to stand without assistance and without losing balance for functional use    Time  8    Period  Weeks    Status  New            Plan - 08/25/18 1229    Clinical Impression Statement   STG met. increased LE wts to 3#. continuing to focus on endurance/strength and balance. adding in gait. pt still requiring assistance with gait and balance and truning is still difficult for pt    PT Treatment/Interventions  ADLs/Self Care Home Management;Electrical Stimulation;Moist Heat;Gait training;Stair training;Therapeutic activities;Functional mobility training;Therapeutic exercise;Patient/family education;Neuromuscular re-education;Balance training    PT Next Visit Plan  progress strength,gait and balance       Patient will benefit from skilled therapeutic intervention in order to improve the following deficits and impairments:  Abnormal gait, Pain, Postural dysfunction, Increased muscle spasms, Cardiopulmonary status limiting activity, Decreased activity tolerance, Decreased strength, Difficulty walking, Decreased balance  Visit Diagnosis: Muscle weakness (generalized)  Difficulty in walking, not elsewhere  classified  Balance problem  Parkinson disease [facility])     Problem List Patient Active Problem List   Diagnosis Date Noted  . Familial hypercholesteremia 02/21/2018  . Psychogenic  gait 10/21/2017  . Coronary artery disease involving native coronary artery of native heart without angina pectoris 06/09/2017  . Abnormal nuclear cardiac imaging test 05/25/2017  . PVC's (premature ventricular contractions) 05/17/2017  . Anxiety 12/29/2016  . Vascular parkinsonism ([place]) 06/29/2016  . Gait disorder 04/07/2016  . Intracranial carotid stenosis, bilateral 02/27/2016  . Essential hypertension   . Left-sided weakness 12/28/2015  . Headache 12/28/2015  . Adjustment reaction with anxiety 12/20/2015  . Left hemiparesis ([place])   . History of CVA with residual deficit   . Prediabetes   . SVT (supraventricular tachycardia) ([place]) 12/14/2015  . Stroke-like symptom 12/13/2015  . History of stroke   . Heart palpitations 09/06/2012  . Hypertension   . Familial hyperlipidemia   . Hypothyroidism   . Vitamin D deficiency   . Palpitations   . History of dizziness   . LVH (left ventricular hypertrophy)   . Aortic stenosis   . Mitral regurgitation   . SOB (shortness of breath)   . Difficulty walking     Antrice Pal,ANGIE PTA 08/25/2018, 12:31 PM  [place], [place], [address] Phone: [phone]   Fax:  [phone]  Name: Stacey Ramirez MRN: [id number] Date of Birth: 1940-10-23

## 2018-08-26 NOTE — Telephone Encounter (Signed)
Manual transmission received and reviewed. 2 "AF" episodes false--ECGs show SR w/ectopy and PACs.

## 2018-08-30 ENCOUNTER — Ambulatory Visit: Payer: PPO | Admitting: Physical Therapy

## 2018-08-30 DIAGNOSIS — R2689 Other abnormalities of gait and mobility: Secondary | ICD-10-CM

## 2018-08-30 DIAGNOSIS — G20 Parkinson's disease: Secondary | ICD-10-CM

## 2018-08-30 DIAGNOSIS — R262 Difficulty in walking, not elsewhere classified: Secondary | ICD-10-CM

## 2018-08-30 DIAGNOSIS — M6281 Muscle weakness (generalized): Secondary | ICD-10-CM

## 2018-08-30 NOTE — Therapy (Signed)
Stacey Ramirez, [place], [address] Phone: [phone]   Fax:  [phone]  Physical Therapy Treatment  Patient Details  Name: Stacey Ramirez MRN: [id number] Date of Birth: [birth year] Referring Provider (PT): Beane   Encounter Date: 08/30/2018  PT End of Session - 08/30/18 1302    Visit Number  6    Date for PT Re-Evaluation  10/14/18    PT Start Time  1225    PT Stop Time  1315    PT Time Calculation (min)  50 min       Past Medical History:  Diagnosis Date  . Adjustment reaction with anxiety 12/20/2015  . Aortic stenosis   . CAD (coronary artery disease) 06/09/2017   Nuc study 10/18: EF 69, inferolateral perfusion defect-possible small infarct with peri-infarct ischemia; intermediate risk // LHC 10/18: pLAD 25, pLCx 50, pRCA 25 >> med Rx  . Carotid stenosis, asymptomatic, bilateral 02/27/2016   Carotid US 5/17: Bilateral ICA 1-39  . Cerebrovascular accident (stroke) ([place])    a. 12/2015 - cryptogenic.  S/P MDT Linq.  . Difficulty walking   . Essential hypertension   . Gait disorder 04/07/2016  . Heart palpitations 09/06/2012  . History of CVA with residual deficit 12/14/2015  . History of dizziness   . History of echocardiogram    Echo 10/18: Vigorous LVF, EF 65-70, normal wall motion, grade 1 diastolic dysfunction, GLS -21.1%, mild RAE  . History of nuclear stress test    Nuclear stress test 10/18: EF 69, inf-lat defect c/w poss infarct with peri-infarct ischemia; Intermediate Risk  . Hyperlipidemia   . Hypothyroidism   . Left hemiparesis ([place])   . LVH (left ventricular hypertrophy)   . Mitral regurgitation    a. 12/2015 Echo: EF 60-65%, mild focal basal hypertrophy. No rwma, triv AI, mild MR, mildly dil LA w/o thrombus. No RA thrombus. No PFO.  Marland Kitchen Palpitations   . Prediabetes   . Stroke-like symptom 12/13/2015  . SVT (supraventricular tachycardia) ([place]) 12/14/2015  . Vascular parkinsonism ([place])  06/29/2016  . Vitamin D deficiency     Past Surgical History:  Procedure Laterality Date  . CARDIOVASCULAR STRESS TEST  2002   NORMAL  . EP IMPLANTABLE DEVICE N/A 12/17/2015   Procedure: Loop Recorder Insertion;  Surgeon: [name], MD;  Location: [facility];  Service: Cardiovascular;  Laterality: N/A;  . LEFT HEART CATH AND CORONARY ANGIOGRAPHY N/A 05/25/2017   Procedure: LEFT HEART CATH AND CORONARY ANGIOGRAPHY;  Surgeon: Sherren Mocha, MD;  Location: [facility];  Service: Cardiovascular;  Laterality: N/A;  . LOOP RECORDER IMPLANT    . TEE WITHOUT CARDIOVERSION N/A 12/17/2015   Procedure: TRANSESOPHAGEAL ECHOCARDIOGRAM (TEE);  Surgeon: Lelon Perla, MD;  Location: [facility] END[facility];  Service: Cardiovascular;  Laterality: N/A;  Pt also needs a LOOP  . TRANSTHORACIC ECHOCARDIOGRAM  2008   SHOWED LEFT VENTRICULAR HYPERTROPHY AND MILD AORTIC STENOSIS    There were no vitals filed for this visit.  Subjective Assessment - 08/30/18 1230    Subjective  walking good with walker but would like to get off walker    Currently in Pain?  Yes    Pain Score  3     Pain Location  Back                       [facility] Adult PT Treatment/Exercise - 08/30/18 0001      Lumbar  Exercises: Aerobic   Tread Mill  2 min 1 mph cuing to pick up Left leg    UBE (Upper Arm Bike)  L 3 2 fwd/2 back    Nustep  L 5 6 min      Lumbar Exercises: Standing   Other Standing Lumbar Exercises  red tband row,ext and abd 2 sets 10   cued to maintain upright trunk CGA     Knee/Hip Exercises: Machines for Strengthening   Cybex Knee Extension  5# 2 sets 12    Cybex Knee Flexion  15# 2 sets 12      Knee/Hip Exercises: Standing   Forward Step Up  Both;1 set;10 reps;Hand Hold: 2;Step Height: 6"   min A   Other Standing Knee Exercises  6 inch alt 20 X 2 sets - min A      Knee/Hip Exercises: Seated   Sit to Sand  2 sets;10 reps;without UE support   with wt ball occassional min A               PT Short Term Goals - 08/25/18 1153      PT SHORT TERM GOAL #1   Title  independent with her home HEP    Status  Achieved        PT Long Term Goals - 08/30/18 1259      PT LONG TERM GOAL #1   Title  decrease TUG time to 22 seconds for functional gait and safety    Baseline  28.2 sec with RW    Status  Partially Met      PT LONG TERM GOAL #3   Title  increase LE strength (particularly ABD and ext.) to 4/5 for functional gait and safety    Status  On-going      PT LONG TERM GOAL #4   Title  be able to do a sit to stand without assistance and without losing balance for functional use    Status  On-going            Plan - 08/30/18 1304    Clinical Impression Statement  TUG goal significantly improved. added TM today am did well with stride. scap stan ex for UE , upright posture and balance- min A and cuing needed. continue with strength,balance and gait    PT Treatment/Interventions  ADLs/Self Care Home Management;Electrical Stimulation;Moist Heat;Gait training;Stair training;Therapeutic activities;Functional mobility training;Therapeutic exercise;Patient/family education;Neuromuscular re-education;Balance training    PT Next Visit Plan  progress strength,gait and balance       Patient will benefit from skilled therapeutic intervention in order to improve the following deficits and impairments:  Abnormal gait, Pain, Postural dysfunction, Increased muscle spasms, Cardiopulmonary status limiting activity, Decreased activity tolerance, Decreased strength, Difficulty walking, Decreased balance  Visit Diagnosis: Muscle weakness (generalized)  Difficulty in walking, not elsewhere classified  Balance problem  Parkinson disease [facility])     Problem List Patient Active Problem List   Diagnosis Date Noted  . Familial hypercholesteremia 02/21/2018  . Psychogenic gait 10/21/2017  . Coronary artery disease involving native coronary artery of native heart  without angina pectoris 06/09/2017  . Abnormal nuclear cardiac imaging test 05/25/2017  . PVC's (premature ventricular contractions) 05/17/2017  . Anxiety 12/29/2016  . Vascular parkinsonism ([place]) 06/29/2016  . Gait disorder 04/07/2016  . Intracranial carotid stenosis, bilateral 02/27/2016  . Essential hypertension   . Left-sided weakness 12/28/2015  . Headache 12/28/2015  . Adjustment reaction with anxiety 12/20/2015  . Left hemiparesis ([place])   .  History of CVA with residual deficit   . Prediabetes   . SVT (supraventricular tachycardia) ([place]) 12/14/2015  . Stroke-like symptom 12/13/2015  . History of stroke   . Heart palpitations 09/06/2012  . Hypertension   . Familial hyperlipidemia   . Hypothyroidism   . Vitamin D deficiency   . Palpitations   . History of dizziness   . LVH (left ventricular hypertrophy)   . Aortic stenosis   . Mitral regurgitation   . SOB (shortness of breath)   . Difficulty walking     Cynithia Hakimi,ANGIE  PTA 08/30/2018, 1:06 PM  [place] [address] [place], [place], [address] Phone: [phone]   Fax:  [phone]  Name: CORTNY BAMBACH MRN: [id number] Date of Birth: 06-09-1941

## 2018-08-31 DIAGNOSIS — N390 Urinary tract infection, site not specified: Secondary | ICD-10-CM | POA: Diagnosis not present

## 2018-08-31 DIAGNOSIS — R300 Dysuria: Secondary | ICD-10-CM | POA: Diagnosis not present

## 2018-09-01 ENCOUNTER — Ambulatory Visit: Payer: PPO | Admitting: Physical Therapy

## 2018-09-06 ENCOUNTER — Ambulatory Visit (INDEPENDENT_AMBULATORY_CARE_PROVIDER_SITE_OTHER): Payer: PPO

## 2018-09-06 ENCOUNTER — Ambulatory Visit: Payer: PPO | Admitting: Physical Therapy

## 2018-09-06 DIAGNOSIS — R413 Other amnesia: Secondary | ICD-10-CM | POA: Diagnosis not present

## 2018-09-06 DIAGNOSIS — R29898 Other symptoms and signs involving the musculoskeletal system: Secondary | ICD-10-CM | POA: Diagnosis not present

## 2018-09-06 DIAGNOSIS — I639 Cerebral infarction, unspecified: Secondary | ICD-10-CM | POA: Diagnosis not present

## 2018-09-06 DIAGNOSIS — Z8673 Personal history of transient ischemic attack (TIA), and cerebral infarction without residual deficits: Secondary | ICD-10-CM | POA: Diagnosis not present

## 2018-09-06 DIAGNOSIS — R292 Abnormal reflex: Secondary | ICD-10-CM | POA: Diagnosis not present

## 2018-09-06 DIAGNOSIS — E785 Hyperlipidemia, unspecified: Secondary | ICD-10-CM | POA: Diagnosis not present

## 2018-09-06 DIAGNOSIS — R2689 Other abnormalities of gait and mobility: Secondary | ICD-10-CM | POA: Diagnosis not present

## 2018-09-08 ENCOUNTER — Encounter: Payer: Self-pay | Admitting: Physical Therapy

## 2018-09-08 ENCOUNTER — Ambulatory Visit: Payer: PPO | Attending: Specialist | Admitting: Physical Therapy

## 2018-09-08 DIAGNOSIS — R2689 Other abnormalities of gait and mobility: Secondary | ICD-10-CM

## 2018-09-08 DIAGNOSIS — R262 Difficulty in walking, not elsewhere classified: Secondary | ICD-10-CM | POA: Diagnosis not present

## 2018-09-08 DIAGNOSIS — M6281 Muscle weakness (generalized): Secondary | ICD-10-CM | POA: Diagnosis not present

## 2018-09-08 LAB — CUP PACEART REMOTE DEVICE CHECK
Date Time Interrogation Session: 20200204050500
Implantable Pulse Generator Implant Date: 20170516

## 2018-09-08 NOTE — Therapy (Signed)
[place], [place], [address] Phone: [phone]   Fax:  [phone]  Physical Therapy Treatment  Patient Details  Name: Stacey Ramirez MRN: [id number] Date of Birth: 1941-06-21 Referring Provider (PT): Beane   Encounter Date: 09/08/2018  PT End of Session - 09/08/18 1443    Visit Number  7    Date for PT Re-Evaluation  10/14/18    PT Start Time  6270    PT Stop Time  1444    PT Time Calculation (min)  49 min       Past Medical History:  Diagnosis Date  . Adjustment reaction with anxiety 12/20/2015  . Aortic stenosis   . CAD (coronary artery disease) 06/09/2017   Nuc study 10/18: EF 69, inferolateral perfusion defect-possible small infarct with peri-infarct ischemia; intermediate risk // LHC 10/18: pLAD 25, pLCx 50, pRCA 25 >> med Rx  . Carotid stenosis, asymptomatic, bilateral 02/27/2016   Carotid US 5/17: Bilateral ICA 1-39  . Cerebrovascular accident (stroke) ([place])    a. 12/2015 - cryptogenic.  S/P MDT Linq.  . Difficulty walking   . Essential hypertension   . Gait disorder 04/07/2016  . Heart palpitations 09/06/2012  . History of CVA with residual deficit 12/14/2015  . History of dizziness   . History of echocardiogram    Echo 10/18: Vigorous LVF, EF 65-70, normal wall motion, grade 1 diastolic dysfunction, GLS -21.1%, mild RAE  . History of nuclear stress test    Nuclear stress test 10/18: EF 69, inf-lat defect c/w poss infarct with peri-infarct ischemia; Intermediate Risk  . Hyperlipidemia   . Hypothyroidism   . Left hemiparesis ([place])   . LVH (left ventricular hypertrophy)   . Mitral regurgitation    a. 12/2015 Echo: EF 60-65%, mild focal basal hypertrophy. No rwma, triv AI, mild MR, mildly dil LA w/o thrombus. No RA thrombus. No PFO.  Marland Kitchen Palpitations   . Prediabetes   . Stroke-like symptom 12/13/2015  . SVT (supraventricular tachycardia) ([place]) 12/14/2015  . Vascular parkinsonism ([place])  06/29/2016  . Vitamin D deficiency     Past Surgical History:  Procedure Laterality Date  . CARDIOVASCULAR STRESS TEST  2002   NORMAL  . EP IMPLANTABLE DEVICE N/A 12/17/2015   Procedure: Loop Recorder Insertion;  Surgeon: [name], MD;  Location: [place] [facility];  Service: Cardiovascular;  Laterality: N/A;  . LEFT HEART CATH AND CORONARY ANGIOGRAPHY N/A 05/25/2017   Procedure: LEFT HEART CATH AND CORONARY ANGIOGRAPHY;  Surgeon: Sherren Mocha, MD;  Location: [place] [facility];  Service: Cardiovascular;  Laterality: N/A;  . LOOP RECORDER IMPLANT    . TEE WITHOUT CARDIOVERSION N/A 12/17/2015   Procedure: TRANSESOPHAGEAL ECHOCARDIOGRAM (TEE);  Surgeon: Lelon Perla, MD;  Location: [facility] ENDOSCOPY;  Service: Cardiovascular;  Laterality: N/A;  Pt also needs a LOOP  . TRANSTHORACIC ECHOCARDIOGRAM  2008   SHOWED LEFT VENTRICULAR HYPERTROPHY AND MILD AORTIC STENOSIS    There were no vitals filed for this visit.  Subjective Assessment - 09/08/18 1402    Subjective  bladder infection, waiting to her from MD    Currently in Pain?  Yes    Pain Score  3     Pain Location  Back                       [facility] Adult PT Treatment/Exercise - 09/08/18 0001      Ambulation/Gait   Gait Comments  amb with RW 225 feet working on cadeance and stride      Lumbar Exercises: Aerobic   UBE (Upper Arm Bike)  L 3 2 fwd/2 back    Recumbent Bike  000000000000000000000000    Nustep  L 4 60mn      Knee/Hip Exercises: Machines for Strengthening   Cybex Knee Extension  5# 3 sets 10    Cybex Knee Flexion  15# 3 sets 10      Knee/Hip Exercises: Standing   Other Standing Knee Exercises  red tband HHA on airex 15 reps hip 3 way      Knee/Hip Exercises: Seated   Sit to Sand  3 sets;5 reps;without UE support               PT Short Term Goals - 08/25/18 1153      PT SHORT TERM GOAL #1   Title  independent with her home HEP    Status  Achieved        PT Long Term Goals -  09/08/18 1423      PT LONG TERM GOAL #1   Title  decrease TUG time to 22 seconds for functional gait and safety    Baseline  39.4 sec with RW      PT LONG TERM GOAL #2   Title  increase Berg balance score to 40/56 for safety    Baseline  not tested today as pt has been out with bladder infection    Status  On-going      PT LONG TERM GOAL #3   Title  increase LE strength (particularly ABD and ext.) to 4/5 for functional gait and safety    Baseline  4/5 hips and knees tested in sitting    Status  On-going      PT LONG TERM GOAL #4   Title  be able to do a sit to stand without assistance and without losing balance for functional use    Status  Partially Met            Plan - 09/08/18 1443    Clinical Impression Statement  pt has missed about 1 1/2 weeks of PT with bladder infection and still dealing with it. decline in TUG since missing OT. tolertaed ther ex well today except hip kicks on airex with HHA very difficult.    PT Treatment/Interventions  ADLs/Self Care Home Management;Electrical Stimulation;Moist Heat;Gait training;Stair training;Therapeutic activities;Functional mobility training;Therapeutic exercise;Patient/family education;Neuromuscular re-education;Balance training    PT Next Visit Plan  progress strength,gait and balance       Patient will benefit from skilled therapeutic intervention in order to improve the following deficits and impairments:  Abnormal gait, Pain, Postural dysfunction, Increased muscle spasms, Cardiopulmonary status limiting activity, Decreased activity tolerance, Decreased strength, Difficulty walking, Decreased balance  Visit Diagnosis: Muscle weakness (generalized)  Difficulty in walking, not elsewhere classified  Balance problem     Problem List Patient Active Problem List   Diagnosis Date Noted  . Familial hypercholesteremia 02/21/2018  . Psychogenic gait 10/21/2017  . Coronary artery disease involving native coronary artery of  native heart without angina pectoris 06/09/2017  . Abnormal nuclear cardiac imaging test 05/25/2017  . PVC's (premature ventricular contractions) 05/17/2017  . Anxiety 12/29/2016  . Vascular parkinsonism ([place] 06/29/2016  . Gait disorder 04/07/2016  . Intracranial carotid stenosis, bilateral 02/27/2016  . Essential hypertension   . Left-sided weakness 12/28/2015  . Headache 12/28/2015  . Adjustment reaction with anxiety 12/20/2015  . Left hemiparesis ([place]   .  History of CVA with residual deficit   . Prediabetes   . SVT (supraventricular tachycardia) ([place]) 12/14/2015  . Stroke-like symptom 12/13/2015  . History of stroke   . Heart palpitations 09/06/2012  . Hypertension   . Familial hyperlipidemia   . Hypothyroidism   . Vitamin D deficiency   . Palpitations   . History of dizziness   . LVH (left ventricular hypertrophy)   . Aortic stenosis   . Mitral regurgitation   . SOB (shortness of breath)   . Difficulty walking     Adda Stokes,ANGIE PTA 09/08/2018, 2:45 PM  [place], [place], [address] Phone: [phone]   Fax:  [phone]  Name: Stacey Ramirez MRN: [id number] Date of Birth: [birth year]

## 2018-09-11 DIAGNOSIS — R269 Unspecified abnormalities of gait and mobility: Secondary | ICD-10-CM | POA: Diagnosis not present

## 2018-09-11 DIAGNOSIS — G20 Parkinson's disease: Secondary | ICD-10-CM | POA: Diagnosis not present

## 2018-09-12 DIAGNOSIS — G214 Vascular parkinsonism: Secondary | ICD-10-CM | POA: Diagnosis not present

## 2018-09-13 ENCOUNTER — Encounter: Payer: Self-pay | Admitting: Cardiovascular Disease

## 2018-09-13 ENCOUNTER — Ambulatory Visit: Payer: PPO | Admitting: Cardiovascular Disease

## 2018-09-13 ENCOUNTER — Ambulatory Visit: Payer: PPO | Admitting: Physical Therapy

## 2018-09-13 VITALS — BP 110/82 | HR 92 | Ht 62.0 in | Wt 120.8 lb

## 2018-09-13 DIAGNOSIS — R262 Difficulty in walking, not elsewhere classified: Secondary | ICD-10-CM

## 2018-09-13 DIAGNOSIS — M6281 Muscle weakness (generalized): Secondary | ICD-10-CM | POA: Diagnosis not present

## 2018-09-13 DIAGNOSIS — I10 Essential (primary) hypertension: Secondary | ICD-10-CM | POA: Diagnosis not present

## 2018-09-13 DIAGNOSIS — R2689 Other abnormalities of gait and mobility: Secondary | ICD-10-CM

## 2018-09-13 NOTE — Therapy (Signed)
[place], [place], [address] Phone: [phone]   Fax:  [phone]  Physical Therapy Treatment  Patient Details  Name: Stacey Ramirez MRN: [id number] Date of Birth: 02/08/1941 Referring Provider (PT): Beane   Encounter Date: 09/13/2018  PT End of Session - 09/13/18 1222    Visit Number  8    Date for PT Re-Evaluation  10/14/18    PT Start Time  1115    PT Stop Time  1210    PT Time Calculation (min)  55 min       Past Medical History:  Diagnosis Date  . Adjustment reaction with anxiety 12/20/2015  . Aortic stenosis   . CAD (coronary artery disease) 06/09/2017   Nuc study 10/18: EF 69, inferolateral perfusion defect-possible small infarct with peri-infarct ischemia; intermediate risk // LHC 10/18: pLAD 25, pLCx 50, pRCA 25 >> med Rx  . Carotid stenosis, asymptomatic, bilateral 02/27/2016   Carotid US 5/17: Bilateral ICA 1-39  . Cerebrovascular accident (stroke) ([place])    a. 12/2015 - cryptogenic.  S/P MDT Linq.  . Difficulty walking   . Essential hypertension   . Gait disorder 04/07/2016  . Heart palpitations 09/06/2012  . History of CVA with residual deficit 12/14/2015  . History of dizziness   . History of echocardiogram    Echo 10/18: Vigorous LVF, EF 65-70, normal wall motion, grade 1 diastolic dysfunction, GLS -21.1%, mild RAE  . History of nuclear stress test    Nuclear stress test 10/18: EF 69, inf-lat defect c/w poss infarct with peri-infarct ischemia; Intermediate Risk  . Hyperlipidemia   . Hypothyroidism   . Left hemiparesis ([place])   . LVH (left ventricular hypertrophy)   . Mitral regurgitation    a. 12/2015 Echo: EF 60-65%, mild focal basal hypertrophy. No rwma, triv AI, mild MR, mildly dil LA w/o thrombus. No RA thrombus. No PFO.  Marland Kitchen Palpitations   . Prediabetes   . Stroke-like symptom 12/13/2015  . SVT (supraventricular tachycardia) ([place]) 12/14/2015  . Vascular parkinsonism ([place])  06/29/2016  . Vitamin D deficiency     Past Surgical History:  Procedure Laterality Date  . CARDIOVASCULAR STRESS TEST  2002   NORMAL  . EP IMPLANTABLE DEVICE N/A 12/17/2015   Procedure: Loop Recorder Insertion;  Surgeon: [name], MD;  Location: [place] [facility];  Service: Cardiovascular;  Laterality: N/A;  . LEFT HEART CATH AND CORONARY ANGIOGRAPHY N/A 05/25/2017   Procedure: LEFT HEART CATH AND CORONARY ANGIOGRAPHY;  Surgeon: Sherren Mocha, MD;  Location: [place] [facility];  Service: Cardiovascular;  Laterality: N/A;  . LOOP RECORDER IMPLANT    . TEE WITHOUT CARDIOVERSION N/A 12/17/2015   Procedure: TRANSESOPHAGEAL ECHOCARDIOGRAM (TEE);  Surgeon: Lelon Perla, MD;  Location: [facility] ENDOSCOPY;  Service: Cardiovascular;  Laterality: N/A;  Pt also needs a LOOP  . TRANSTHORACIC ECHOCARDIOGRAM  2008   SHOWED LEFT VENTRICULAR HYPERTROPHY AND MILD AORTIC STENOSIS    There were no vitals filed for this visit.  Subjective Assessment - 09/13/18 1121    Subjective  saw heart MD and all was good so they really want me to exercise    Currently in Pain?  No/denies                       [facility] Adult PT Treatment/Exercise - 09/13/18 0001      High Level Balance   High Level Balance Comments  ball toss on  airex, working on righting reaction      Lumbar Exercises: Aerobic   Tread Mill  1 mph 4 min CGA   working on cadeance and picking up left leg   UBE (Upper Arm Bike)  L 3 3 fwd/3back    Nustep  L 5 6 min      Knee/Hip Exercises: Machines for Strengthening   Cybex Knee Extension  5# 3 sets 10    Cybex Knee Flexion  15# 3 sets 10      Knee/Hip Exercises: Standing   Walking with Sports Cord  30# 5x fwd/back and 3 times each side   Constant Verb cuing and Min A   Other Standing Knee Exercises  6 inch alt tap min A with multi falls and difficulty picking up left leg   20 reps 2 times            PT Education - 09/13/18 1222    Education Details  ride bike at  home 10 min 3 times per day    Person(s) Educated  Patient;Spouse    Methods  Explanation    Comprehension  Verbalized understanding       PT Short Term Goals - 08/25/18 1153      PT SHORT TERM GOAL #1   Title  independent with her home HEP    Status  Achieved        PT Long Term Goals - 09/08/18 1423      PT LONG TERM GOAL #1   Title  decrease TUG time to 22 seconds for functional gait and safety    Baseline  39.4 sec with RW      PT LONG TERM GOAL #2   Title  increase Berg balance score to 40/56 for safety    Baseline  not tested today as pt has been out with bladder infection    Status  On-going      PT LONG TERM GOAL #3   Title  increase LE strength (particularly ABD and ext.) to 4/5 for functional gait and safety    Baseline  4/5 hips and knees tested in sitting    Status  On-going      PT LONG TERM GOAL #4   Title  be able to do a sit to stand without assistance and without losing balance for functional use    Status  Partially Met            Plan - 09/13/18 1222    Clinical Impression Statement  increased activiy today and added increased balance activity that required min A with multi LOB. Pt did show ability to self correct with LOB but this is only after PTA prevents fall- minimal to no righting reaction when initial LOB occurs    PT Treatment/Interventions  ADLs/Self Care Home Management;Electrical Stimulation;Moist Heat;Gait training;Stair training;Therapeutic activities;Functional mobility training;Therapeutic exercise;Patient/family education;Neuromuscular re-education;Balance training    PT Next Visit Plan  progress strength,gait and balance       Patient will benefit from skilled therapeutic intervention in order to improve the following deficits and impairments:  Abnormal gait, Pain, Postural dysfunction, Increased muscle spasms, Cardiopulmonary status limiting activity, Decreased activity tolerance, Decreased strength, Difficulty walking, Decreased  balance  Visit Diagnosis: Difficulty in walking, not elsewhere classified  Muscle weakness (generalized)  Balance problem     Problem List Patient Active Problem List   Diagnosis Date Noted  . Familial hypercholesteremia 02/21/2018  . Psychogenic gait 10/21/2017  . Coronary artery disease involving native coronary  artery of native heart without angina pectoris 06/09/2017  . Abnormal nuclear cardiac imaging test 05/25/2017  . PVC's (premature ventricular contractions) 05/17/2017  . Anxiety 12/29/2016  . Vascular parkinsonism ([place]) 06/29/2016  . Gait disorder 04/07/2016  . Intracranial carotid stenosis, bilateral 02/27/2016  . Essential hypertension   . Left-sided weakness 12/28/2015  . Headache 12/28/2015  . Adjustment reaction with anxiety 12/20/2015  . Left hemiparesis ([place])   . History of CVA with residual deficit   . Prediabetes   . SVT (supraventricular tachycardia) ([place]) 12/14/2015  . Stroke-like symptom 12/13/2015  . History of stroke   . Heart palpitations 09/06/2012  . Hypertension   . Familial hyperlipidemia   . Hypothyroidism   . Vitamin D deficiency   . Palpitations   . History of dizziness   . LVH (left ventricular hypertrophy)   . Aortic stenosis   . Mitral regurgitation   . SOB (shortness of breath)   . Difficulty walking     ,ANGIE PTA 09/13/2018, 12:24 PM  [place], [place], [address] Phone: [phone]   Fax:  [phone]  Name: Stacey Ramirez MRN: [id number] Date of Birth: [birth year]

## 2018-09-13 NOTE — Progress Notes (Signed)
Office Visit    Patient Name: Stacey Ramirez Date of Encounter: 09/13/2018  Primary Care Provider:  Deland Pretty, MD Primary Cardiologist:  Stacey Browner, MD  P. Nahser, MD    78 year old female with a history of palpitations, hypertension, hyperlipidemia, and recent stroke who presents for follow-up.    Past Medical History    Past Medical History:  Diagnosis Date  . Adjustment reaction with anxiety 12/20/2015  . Aortic stenosis   . CAD (coronary artery disease) 06/09/2017   Nuc study 10/18: EF 69, inferolateral perfusion defect-possible small infarct with peri-infarct ischemia; intermediate risk // LHC 10/18: pLAD 25, pLCx 50, pRCA 25 >> med Rx  . Carotid stenosis, asymptomatic, bilateral 02/27/2016   Carotid US 5/17: Bilateral ICA 1-39  . Cerebrovascular accident (stroke) ([place])    a. 12/2015 - cryptogenic.  S/P MDT Linq.  . Difficulty walking   . Essential hypertension   . Gait disorder 04/07/2016  . Heart palpitations 09/06/2012  . History of CVA with residual deficit 12/14/2015  . History of dizziness   . History of echocardiogram    Echo 10/18: Vigorous LVF, EF 65-70, normal wall motion, grade 1 diastolic dysfunction, GLS -21.1%, mild RAE  . History of nuclear stress test    Nuclear stress test 10/18: EF 69, inf-lat defect c/w poss infarct with peri-infarct ischemia; Intermediate Risk  . Hyperlipidemia   . Hypothyroidism   . Left hemiparesis (Stacey Ramirez)   . LVH (left ventricular hypertrophy)   . Mitral regurgitation    a. 12/2015 Echo: EF 60-65%, mild focal basal hypertrophy. No rwma, triv AI, mild MR, mildly dil LA w/o thrombus. No RA thrombus. No PFO.  Marland Kitchen Palpitations   . Prediabetes   . Stroke-like symptom 12/13/2015  . SVT (supraventricular tachycardia) ([place]) 12/14/2015  . Vascular parkinsonism ([place]) 06/29/2016  . Vitamin D deficiency    Past Surgical History:  Procedure Laterality Date  . CARDIOVASCULAR STRESS TEST  2002   NORMAL  . EP IMPLANTABLE DEVICE N/A 12/17/2015     Procedure: Loop Recorder Insertion;  Surgeon: Stacey Grayer, MD;  Location: [facility];  Service: Cardiovascular;  Laterality: N/A;  . LEFT HEART CATH AND CORONARY ANGIOGRAPHY N/A 05/25/2017   Procedure: LEFT HEART CATH AND CORONARY ANGIOGRAPHY;  Surgeon: Stacey Mocha, MD;  Location: [facility];  Service: Cardiovascular;  Laterality: N/A;  . LOOP RECORDER IMPLANT    . TEE WITHOUT CARDIOVERSION N/A 12/17/2015   Procedure: TRANSESOPHAGEAL ECHOCARDIOGRAM (TEE);  Surgeon: Lelon Perla, MD;  Location: [facility] ENDOSCOPY;  Service: Cardiovascular;  Laterality: N/A;  Pt also needs a LOOP  . TRANSTHORACIC ECHOCARDIOGRAM  2008   SHOWED LEFT VENTRICULAR HYPERTROPHY AND MILD AORTIC STENOSIS    Allergies  Allergies  Allergen Reactions  . Sulfa Drugs Cross Reactors Itching  . Zocor [Simvastatin] Other (See Comments)    Feels like she is going to pass out, weakness  . Crestor [Rosuvastatin Calcium]     Muscle weakness  . Statins Other (See Comments)    Unable to walk or stand. Unable to walk or stand. Unable to walk or stand.  . Sulfa Antibiotics     welts  . Micardis [Telmisartan] Other (See Comments)    Feels like she is going to pass out    Notes from Stacey Bayley, NP    78 year old female previously followed by Stacey Ramirez for hypertension and palpitations. She recently was admitted for cryptogenic stroke (right basal ganglia infarct), and underwent TEE revealing normal LV  function without evidence of atrial thrombus. She subsequently underwent placement of an implantable loop recorder. She has had no events on this recorder up to this point. She has been recovering well from her stroke and can now walk, though with a limp. She is encouraged overall. Her blood pressure at home has been running in the 140s to 150s, and her husband has been checking it about 3 times per day. She has not been having any palpitations, chest pain, dyspnea, PND, orthopnea, dizziness, syncope, edema,  or early satiety.  Oct. 20 ,2017: Stacey Ramirez is seen for the first time today.  Transfer from [place] . Has some leg / feet swelling  - likely due to the amlodipine .   Dec 01, 2016:  Is having some difficulty in walking since her stroke.   Working with PT.  Brought her BP log with her.   Has some elevated readings and then later will have low readings.  Suggested that she might be eating salty foods at times .   No CP or dyspnea    Has an implantable loop recorder.   Has not had any syncope Has not had it interrogated.      Has poor balance   Feb. 11, 2019  She has history of hypertension and hyperlipidemia. She was admitted with some shortness of breath  Cath 05/25/17  revealed mild - moderate CAD ,  No obstructive lesion s  Has done well since then .  Has some DOE with exercise  Is still very limited by her stroke   Aug. 26, 2019: Stacey Ramirez is seen today for follow up  She is had a stroke.  She has an implantable loop recorder in place. Was recovering from her stroke. Had sudden weakness of her left leg.    Has had some elevated BP .   September 13, 2018: This is Stacey Ramirez is seen today for follow-up visit.  She has a history of hypertension and has had a stroke.  She has an implantable loop recorder in place.  Has had several bad falls over the past several months  Has been taking amlodipine 10 mg at night.  No CP ,  Slight  dyspnea  Is using a walker now.   Is unsteady on her feet   Home Medications    Prior to Admission medications   Medication Sig Start Date End Date Taking? Authorizing Provider  ALPRAZolam (XANAX) 0.25 MG tablet Take 1 tablet (0.25 mg total) by mouth at bedtime. 12/25/15  Yes Stacey Anchors Love, PA-C  amLODipine (NORVASC) 10 MG tablet Take 1 tablet (10 mg total) by mouth 2 (two) times daily. Reported on 12/27/2015 01/23/16  Yes Stacey Mire, NP  aspirin EC 325 MG EC tablet Take 1 tablet (325 mg total) by mouth daily. 12/17/15  Yes Stacey Hamman, MD  B  Complex-C-Folic Acid (STRESS FORMULA) TABS Take 1 tablet by mouth daily.    Yes Historical Provider, MD  carboxymethylcellulose (REFRESH PLUS) 0.5 % SOLN Place 1 drop into both eyes 3 (three) times daily as needed.   Yes Historical Provider, MD  clopidogrel (PLAVIX) 75 MG tablet Take 1 tablet (75 mg total) by mouth daily. 12/24/15  Yes Stacey Anchors Love, PA-C  estradiol (ESTRACE) 0.1 MG/GM vaginal cream Place 2 g vaginally as needed (dryness).    Yes Historical Provider, MD  irbesartan (AVAPRO) 300 MG tablet Take 1 tablet (300 mg total) by mouth daily. 12/29/15  Yes Velvet Bathe, MD  levothyroxine (SYNTHROID, [place])  25 MCG tablet Take 25 mcg by mouth daily before breakfast.  09/21/14  Yes Historical Provider, MD  LOTEMAX 0.5 % ophthalmic suspension Place 1 drop into both eyes 4 (four) times daily as needed. For dry itchy eyes 02/17/13  Yes Historical Provider, MD  metoprolol tartrate (LOPRESSOR) 25 MG tablet Take 0.5 tablets (12.5 mg total) by mouth 2 (two) times daily. 12/24/15  Yes Stacey Anchors Love, PA-C  Multiple Vitamin (MULTIVITAMIN WITH MINERALS) TABS tablet Take 1 tablet by mouth daily.   Yes Historical Provider, MD  polyethylene glycol (MIRALAX / GLYCOLAX) packet Take 17 g by mouth daily. Patient taking differently: Take 17 g by mouth daily as needed for moderate constipation.  12/17/15  Yes Stacey Hamman, MD  sertraline (ZOLOFT) 50 MG tablet Take 50 mg by mouth daily.  01/02/16  Yes Historical Provider, MD    Review of Systems     Physical Exam: Blood pressure 110/82, pulse 92, height 5\' 2"  (1.575 m), weight 120 lb 12.8 oz (54.8 kg), SpO2 98 %.  GEN: Elderly female, walks with the assistance of a walker. HEENT: Normal NECK: No JVD; No carotid bruits LYMPHATICS: No lymphadenopathy CARDIAC: RRR ,  Soft murmur  RESPIRATORY:  Clear to auscultation without rales, wheezing or rhonchi  ABDOMEN: Soft, non-tender, non-distended MUSCULOSKELETAL:  No edema; No deformity  SKIN: Warm and  dry NEUROLOGIC:  Alert and oriented x 3   Accessory Clinical Findings    ECG   :     Assessment & Plan    1.  Cryptogenic stroke: Has a history of a stroke.  Has an implantable loop in.  No signs of atrial fibrillation at this point.  Continue current medications.     2. Essential hypertension:     Seems to be fairly well controlled.    4. Hyperlipidemia:    Stable.   5. CAD -has mild to moderate coronary artery disease.  Continue current medications.  She is not having any episodes of angina.   Mertie Moores, MD  09/13/2018 8:44 AM    [address],  [place],   [address] Pager [phone] Phone: [phone]; Fax: [phone]

## 2018-09-13 NOTE — Patient Instructions (Addendum)
Medication Instructions:  Your physician recommends that you continue on your current medications as directed. Please refer to the Current Medication list given to you today.  If you need a refill on your cardiac medications before your next appointment, please call your pharmacy.   Lab work: None Ordered   Testing/Procedures: None Ordered   Follow-Up: At [facility], you and your health needs are our priority.  As part of our continuing mission to provide you with exceptional heart care, we have created designated Provider Care Teams.  These Care Teams include your primary Cardiologist (physician) and Advanced Practice Providers (APPs -  Physician Assistants and Nurse Practitioners) who all work together to provide you with the care you need, when you need it. You will need a follow up appointment in:  6 months.  Please call our office 2 months in advance to schedule this appointment.  You may see Zohar Laing, MD or one of the following Advanced Practice Providers on your designated Care Team: Scott Weaver, PA-C Vin Bhagat, PA-C . Janine Hammond, NP   For your  leg edema you  should do  the following 1. Leg elevation - I recommend the Lounge Dr. Leg rest.  See below for details  2. Salt restriction  -  Use potassium chloride instead of regular salt as a salt substitute. 3. Walk regularly 4. Compression hose - [facility] [employer] 5. Weight loss    Available on [email or web address] Or  Go to [email or web address]      

## 2018-09-15 ENCOUNTER — Encounter: Payer: Self-pay | Admitting: Physical Therapy

## 2018-09-15 ENCOUNTER — Ambulatory Visit: Payer: PPO | Admitting: Physical Therapy

## 2018-09-15 NOTE — Progress Notes (Signed)
Carelink Summary Report / Loop Recorder 

## 2018-09-20 ENCOUNTER — Ambulatory Visit: Payer: PPO | Admitting: Physical Therapy

## 2018-09-20 DIAGNOSIS — M6281 Muscle weakness (generalized): Secondary | ICD-10-CM

## 2018-09-20 DIAGNOSIS — R2689 Other abnormalities of gait and mobility: Secondary | ICD-10-CM

## 2018-09-20 DIAGNOSIS — R262 Difficulty in walking, not elsewhere classified: Secondary | ICD-10-CM

## 2018-09-20 NOTE — Therapy (Signed)
[place], [place], [address] Phone: [phone]   Fax:  [phone]  Physical Therapy Treatment  Patient Details  Name: Stacey Ramirez MRN: [id number] Date of Birth: 05-12-1941 Referring Provider (PT): Beane   Encounter Date: 09/20/2018  PT End of Session - 09/20/18 1445    Visit Number  9    Date for PT Re-Evaluation  10/14/18    PT Start Time  3212    PT Stop Time  2482    PT Time Calculation (min)  50 min       Past Medical History:  Diagnosis Date  . Adjustment reaction with anxiety 12/20/2015  . Aortic stenosis   . CAD (coronary artery disease) 06/09/2017   Nuc study 10/18: EF 69, inferolateral perfusion defect-possible small infarct with peri-infarct ischemia; intermediate risk // LHC 10/18: pLAD 25, pLCx 50, pRCA 25 >> med Rx  . Carotid stenosis, asymptomatic, bilateral 02/27/2016   Carotid US 5/17: Bilateral ICA 1-39  . Cerebrovascular accident (stroke) ([place])    a. 12/2015 - cryptogenic.  S/P MDT Linq.  . Difficulty walking   . Essential hypertension   . Gait disorder 04/07/2016  . Heart palpitations 09/06/2012  . History of CVA with residual deficit 12/14/2015  . History of dizziness   . History of echocardiogram    Echo 10/18: Vigorous LVF, EF 65-70, normal wall motion, grade 1 diastolic dysfunction, GLS -21.1%, mild RAE  . History of nuclear stress test    Nuclear stress test 10/18: EF 69, inf-lat defect c/w poss infarct with peri-infarct ischemia; Intermediate Risk  . Hyperlipidemia   . Hypothyroidism   . Left hemiparesis ([place])   . LVH (left ventricular hypertrophy)   . Mitral regurgitation    a. 12/2015 Echo: EF 60-65%, mild focal basal hypertrophy. No rwma, triv AI, mild MR, mildly dil LA w/o thrombus. No RA thrombus. No PFO.  Marland Kitchen Palpitations   . Prediabetes   . Stroke-like symptom 12/13/2015  . SVT (supraventricular tachycardia) ([place]) 12/14/2015  . Vascular parkinsonism ([place])  06/29/2016  . Vitamin D deficiency     Past Surgical History:  Procedure Laterality Date  . CARDIOVASCULAR STRESS TEST  2002   NORMAL  . EP IMPLANTABLE DEVICE N/A 12/17/2015   Procedure: Loop Recorder Insertion;  Surgeon: [name], MD;  Location: [facility];  Service: Cardiovascular;  Laterality: N/A;  . LEFT HEART CATH AND CORONARY ANGIOGRAPHY N/A 05/25/2017   Procedure: LEFT HEART CATH AND CORONARY ANGIOGRAPHY;  Surgeon: Sherren Mocha, MD;  Location: [facility];  Service: Cardiovascular;  Laterality: N/A;  . LOOP RECORDER IMPLANT    . TEE WITHOUT CARDIOVERSION N/A 12/17/2015   Procedure: TRANSESOPHAGEAL ECHOCARDIOGRAM (TEE);  Surgeon: Lelon Perla, MD;  Location: [facility] ENDOSCOPY;  Service: Cardiovascular;  Laterality: N/A;  Pt also needs a LOOP  . TRANSTHORACIC ECHOCARDIOGRAM  2008   SHOWED LEFT VENTRICULAR HYPERTROPHY AND MILD AORTIC STENOSIS    There were no vitals filed for this visit.  Subjective Assessment - 09/20/18 1400    Subjective  good and bad days    Currently in Pain?  No/denies                       [facility] Adult PT Treatment/Exercise - 09/20/18 0001      Ambulation/Gait   Gait Comments  gait with HHA and with 2 sticks to work on increased sepped and cadeance  Lumbar Exercises: Aerobic   Tread Mill  1 mph 5 min CGA   cued to increase on left   UBE (Upper Arm Bike)  L 3 3 fwd/3back    Nustep  L 5 9 min      Lumbar Exercises: Seated   Sit to Stand  20 reps   with wt ball     Knee/Hip Exercises: Machines for Strengthening   Cybex Knee Extension  5# 10x, 10 # 10 x    Cybex Knee Flexion  20# 2 sets 10               PT Short Term Goals - 08/25/18 1153      PT SHORT TERM GOAL #1   Title  independent with her home HEP    Status  Achieved        PT Long Term Goals - 09/20/18 1417      PT LONG TERM GOAL #1   Title  decrease TUG time to 22 seconds for functional gait and safety    Baseline  with RW  34   sec, turns are difficult    Status  Partially Met      PT LONG TERM GOAL #2   Title  increase Berg balance score to 40/56 for safety    Status  On-going      PT LONG TERM GOAL #3   Title  increase LE strength (particularly ABD and ext.) to 4/5 for functional gait and safety    Status  Achieved      PT LONG TERM GOAL #4   Title  be able to do a sit to stand without assistance and without losing balance for functional use    Status  Partially Met            Plan - 09/20/18 1445    Clinical Impression Statement  some improvement with TUG score. pt did very well with HHA and reciprocal gait with stcks but without AD or HHA pt freezes up and shuffles gait. truns are very difficult for pt. increased time on Nustep and TM for endurance as well as added wt to machines    PT Treatment/Interventions  ADLs/Self Care Home Management;Electrical Stimulation;Moist Heat;Gait training;Stair training;Therapeutic activities;Functional mobility training;Therapeutic exercise;Patient/family education;Neuromuscular re-education;Balance training       Patient will benefit from skilled therapeutic intervention in order to improve the following deficits and impairments:  Abnormal gait, Pain, Postural dysfunction, Increased muscle spasms, Cardiopulmonary status limiting activity, Decreased activity tolerance, Decreased strength, Difficulty walking, Decreased balance  Visit Diagnosis: Difficulty in walking, not elsewhere classified  Muscle weakness (generalized)  Balance problem     Problem List Patient Active Problem List   Diagnosis Date Noted  . Familial hypercholesteremia 02/21/2018  . Psychogenic gait 10/21/2017  . Coronary artery disease involving native coronary artery of native heart without angina pectoris 06/09/2017  . Abnormal nuclear cardiac imaging test 05/25/2017  . PVC's (premature ventricular contractions) 05/17/2017  . Anxiety 12/29/2016  . Vascular parkinsonism ([place]) 06/29/2016   . Gait disorder 04/07/2016  . Intracranial carotid stenosis, bilateral 02/27/2016  . Essential hypertension   . Left-sided weakness 12/28/2015  . Headache 12/28/2015  . Adjustment reaction with anxiety 12/20/2015  . Left hemiparesis ([place])   . History of CVA with residual deficit   . Prediabetes   . SVT (supraventricular tachycardia) ([place]) 12/14/2015  . Stroke-like symptom 12/13/2015  . History of stroke   . Heart palpitations 09/06/2012  . Hypertension   . Familial  hyperlipidemia   . Hypothyroidism   . Vitamin D deficiency   . Palpitations   . History of dizziness   . LVH (left ventricular hypertrophy)   . Aortic stenosis   . Mitral regurgitation   . SOB (shortness of breath)   . Difficulty walking     Jourdyn Hasler,ANGIE PTA 09/20/2018, 2:47 PM  [place], [place], [address] Phone: [phone]   Fax:  [phone]  Name: Stacey Ramirez MRN: [id number] Date of Birth: April 08, 1941

## 2018-09-21 DIAGNOSIS — I10 Essential (primary) hypertension: Secondary | ICD-10-CM | POA: Diagnosis not present

## 2018-09-21 DIAGNOSIS — G20 Parkinson's disease: Secondary | ICD-10-CM | POA: Diagnosis not present

## 2018-09-21 DIAGNOSIS — R197 Diarrhea, unspecified: Secondary | ICD-10-CM | POA: Diagnosis not present

## 2018-09-22 ENCOUNTER — Ambulatory Visit: Payer: PPO | Admitting: Physical Therapy

## 2018-09-22 DIAGNOSIS — R2689 Other abnormalities of gait and mobility: Secondary | ICD-10-CM

## 2018-09-22 DIAGNOSIS — M4856XD Collapsed vertebra, not elsewhere classified, lumbar region, subsequent encounter for fracture with routine healing: Secondary | ICD-10-CM | POA: Diagnosis not present

## 2018-09-22 DIAGNOSIS — R262 Difficulty in walking, not elsewhere classified: Secondary | ICD-10-CM

## 2018-09-22 DIAGNOSIS — M6281 Muscle weakness (generalized): Secondary | ICD-10-CM

## 2018-09-22 NOTE — Therapy (Signed)
[place], [place], [address] Phone: [phone]   Fax:  [phone] Progress Note Reporting Period 08/15/18 to 09/22/18 for the first 10 visits  See note below for Objective Data and Assessment of Progress/Goals.      Physical Therapy Treatment  Patient Details  Name: Stacey Ramirez MRN: [id number] Date of Birth: 12/22/1940 Referring Provider (PT): Beane   Encounter Date: 09/22/2018  PT End of Session - 09/22/18 1708    Visit Number  10    Date for PT Re-Evaluation  10/14/18    PT Start Time  6834    PT Stop Time  1710    PT Time Calculation (min)  60 min       Past Medical History:  Diagnosis Date  . Adjustment reaction with anxiety 12/20/2015  . Aortic stenosis   . CAD (coronary artery disease) 06/09/2017   Nuc study 10/18: EF 69, inferolateral perfusion defect-possible small infarct with peri-infarct ischemia; intermediate risk // LHC 10/18: pLAD 25, pLCx 50, pRCA 25 >> med Rx  . Carotid stenosis, asymptomatic, bilateral 02/27/2016   Carotid US 5/17: Bilateral ICA 1-39  . Cerebrovascular accident (stroke) ([place])    a. 12/2015 - cryptogenic.  S/P MDT Linq.  . Difficulty walking   . Essential hypertension   . Gait disorder 04/07/2016  . Heart palpitations 09/06/2012  . History of CVA with residual deficit 12/14/2015  . History of dizziness   . History of echocardiogram    Echo 10/18: Vigorous LVF, EF 65-70, normal wall motion, grade 1 diastolic dysfunction, GLS -21.1%, mild RAE  . History of nuclear stress test    Nuclear stress test 10/18: EF 69, inf-lat defect c/w poss infarct with peri-infarct ischemia; Intermediate Risk  . Hyperlipidemia   . Hypothyroidism   . Left hemiparesis ([place])   . LVH (left ventricular hypertrophy)   . Mitral regurgitation    a. 12/2015 Echo: EF 60-65%, mild focal basal hypertrophy. No rwma, triv AI, mild MR, mildly dil LA w/o thrombus. No RA thrombus. No PFO.  Marland Kitchen  Palpitations   . Prediabetes   . Stroke-like symptom 12/13/2015  . SVT (supraventricular tachycardia) ([place]) 12/14/2015  . Vascular parkinsonism ([place]) 06/29/2016  . Vitamin D deficiency     Past Surgical History:  Procedure Laterality Date  . CARDIOVASCULAR STRESS TEST  2002   NORMAL  . EP IMPLANTABLE DEVICE N/A 12/17/2015   Procedure: Loop Recorder Insertion;  Surgeon: [name], MD;  Location: [place] [facility];  Service: Cardiovascular;  Laterality: N/A;  . LEFT HEART CATH AND CORONARY ANGIOGRAPHY N/A 05/25/2017   Procedure: LEFT HEART CATH AND CORONARY ANGIOGRAPHY;  Surgeon: Sherren Mocha, MD;  Location: [place] [facility];  Service: Cardiovascular;  Laterality: N/A;  . LOOP RECORDER IMPLANT    . TEE WITHOUT CARDIOVERSION N/A 12/17/2015   Procedure: TRANSESOPHAGEAL ECHOCARDIOGRAM (TEE);  Surgeon: Lelon Perla, MD;  Location: [facility] END[facility];  Service: Cardiovascular;  Laterality: N/A;  Pt also needs a LOOP  . TRANSTHORACIC ECHOCARDIOGRAM  2008   SHOWED LEFT VENTRICULAR HYPERTROPHY AND MILD AORTIC STENOSIS    There were no vitals filed for this visit.  Subjective Assessment - 09/22/18 1614    Subjective  saw ortho MD and he released me    Currently in Pain?  Yes    Pain Score  3     Pain Location  Back  [place] Adult PT Treatment/Exercise - 09/22/18 0001      Ambulation/Gait   Gait Comments  gait with HHA o work on increased speed and turns   worked on gait without AD with CSBA to encourage confidence      High Level Balance   High Level Balance Activities  Marching forwards;Marching backwards;Side stepping;Braiding;Tandem walking   HHA with min A   High Level Balance Comments  alt step tapping min A with cuing to right balance and pick feet up to clear box      Lumbar Exercises: Aerobic   UBE (Upper Arm Bike)  L 4 3 fwd/3back    Nustep  L 5 7 min      Lumbar Exercises: Seated   Sit to Stand  20 reps   from mat inconsistant  assistance     Knee/Hip Exercises: Machines for Strengthening   Cybex Knee Extension  10# 10X , left only 5# 2 sets 5    Cybex Knee Flexion  15# left only 10 times, 20 # BIL 10               PT Short Term Goals - 08/25/18 1153      PT SHORT TERM GOAL #1   Title  independent with her home HEP    Status  Achieved        PT Long Term Goals - 09/20/18 1417      PT LONG TERM GOAL #1   Title  decrease TUG time to 22 seconds for functional gait and safety    Baseline  with RW  34  sec, turns are difficult    Status  Partially Met      PT LONG TERM GOAL #2   Title  increase Berg balance score to 40/56 for safety    Status  On-going      PT LONG TERM GOAL #3   Title  increase LE strength (particularly ABD and ext.) to 4/5 for functional gait and safety    Status  Achieved      PT LONG TERM GOAL #4   Title  be able to do a sit to stand without assistance and without losing balance for functional use    Status  Partially Met            Plan - 09/22/18 1708    Clinical Impression Statement  focus today session on more balance and gait, picking up left foot, truns and dynamic balance. Some improved righting reaction but still requires assistance through much of session.    PT Treatment/Interventions  ADLs/Self Care Home Management;Electrical Stimulation;Moist Heat;Gait training;Stair training;Therapeutic activities;Functional mobility training;Therapeutic exercise;Patient/family education;Neuromuscular re-education;Balance training    PT Next Visit Plan  progress strength,gait and balance       Patient will benefit from skilled therapeutic intervention in order to improve the following deficits and impairments:  Abnormal gait, Pain, Postural dysfunction, Increased muscle spasms, Cardiopulmonary status limiting activity, Decreased activity tolerance, Decreased strength, Difficulty walking, Decreased balance  Visit Diagnosis: Difficulty in walking, not elsewhere  classified  Muscle weakness (generalized)  Balance problem     Problem List Patient Active Problem List   Diagnosis Date Noted  . Familial hypercholesteremia 02/21/2018  . Psychogenic gait 10/21/2017  . Coronary artery disease involving native coronary artery of native heart without angina pectoris 06/09/2017  . Abnormal nuclear cardiac imaging test 05/25/2017  . PVC's (premature ventricular contractions) 05/17/2017  . Anxiety 12/29/2016  . Vascular parkinsonism ([place]) 06/29/2016  . Gait  disorder 04/07/2016  . Intracranial carotid stenosis, bilateral 02/27/2016  . Essential hypertension   . Left-sided weakness 12/28/2015  . Headache 12/28/2015  . Adjustment reaction with anxiety 12/20/2015  . Left hemiparesis ([place])   . History of CVA with residual deficit   . Prediabetes   . SVT (supraventricular tachycardia) ([place]) 12/14/2015  . Stroke-like symptom 12/13/2015  . History of stroke   . Heart palpitations 09/06/2012  . Hypertension   . Familial hyperlipidemia   . Hypothyroidism   . Vitamin D deficiency   . Palpitations   . History of dizziness   . LVH (left ventricular hypertrophy)   . Aortic stenosis   . Mitral regurgitation   . SOB (shortness of breath)   . Difficulty walking     PAYSEUR,ANGIE  PTA 09/22/2018, 5:10 PM  [place], [place], [address] Phone: [phone]   Fax:  [phone]  Name: Stacey Ramirez MRN: [id number] Date of Birth: 04/17/1941

## 2018-09-26 ENCOUNTER — Ambulatory Visit: Payer: PPO | Admitting: Physical Therapy

## 2018-09-26 DIAGNOSIS — R2689 Other abnormalities of gait and mobility: Secondary | ICD-10-CM

## 2018-09-26 DIAGNOSIS — M6281 Muscle weakness (generalized): Secondary | ICD-10-CM

## 2018-09-26 DIAGNOSIS — R262 Difficulty in walking, not elsewhere classified: Secondary | ICD-10-CM

## 2018-09-26 NOTE — Therapy (Signed)
[place], [place], [address] Phone: [phone]   Fax:  [phone]  Physical Therapy Treatment  Patient Details  Name: Stacey Ramirez MRN: [id number] Date of Birth: 1940/09/09 Referring Provider (PT): Beane   Encounter Date: 09/26/2018  PT End of Session - 09/26/18 1358    Visit Number  11    Date for PT Re-Evaluation  10/14/18    PT Start Time  1315    PT Stop Time  1400    PT Time Calculation (min)  45 min       Past Medical History:  Diagnosis Date  . Adjustment reaction with anxiety 12/20/2015  . Aortic stenosis   . CAD (coronary artery disease) 06/09/2017   Nuc study 10/18: EF 69, inferolateral perfusion defect-possible small infarct with peri-infarct ischemia; intermediate risk // LHC 10/18: pLAD 25, pLCx 50, pRCA 25 >> med Rx  . Carotid stenosis, asymptomatic, bilateral 02/27/2016   Carotid US 5/17: Bilateral ICA 1-39  . Cerebrovascular accident (stroke) ([place])    a. 12/2015 - cryptogenic.  S/P MDT Linq.  . Difficulty walking   . Essential hypertension   . Gait disorder 04/07/2016  . Heart palpitations 09/06/2012  . History of CVA with residual deficit 12/14/2015  . History of dizziness   . History of echocardiogram    Echo 10/18: Vigorous LVF, EF 65-70, normal wall motion, grade 1 diastolic dysfunction, GLS -21.1%, mild RAE  . History of nuclear stress test    Nuclear stress test 10/18: EF 69, inf-lat defect c/w poss infarct with peri-infarct ischemia; Intermediate Risk  . Hyperlipidemia   . Hypothyroidism   . Left hemiparesis ([place])   . LVH (left ventricular hypertrophy)   . Mitral regurgitation    a. 12/2015 Echo: EF 60-65%, mild focal basal hypertrophy. No rwma, triv AI, mild MR, mildly dil LA w/o thrombus. No RA thrombus. No PFO.  Marland Kitchen Palpitations   . Prediabetes   . Stroke-like symptom 12/13/2015  . SVT (supraventricular tachycardia) ([place]) 12/14/2015  . Vascular parkinsonism ([place])  06/29/2016  . Vitamin D deficiency     Past Surgical History:  Procedure Laterality Date  . CARDIOVASCULAR STRESS TEST  2002   NORMAL  . EP IMPLANTABLE DEVICE N/A 12/17/2015   Procedure: Loop Recorder Insertion;  Surgeon: [name], MD;  Location: [facility];  Service: Cardiovascular;  Laterality: N/A;  . LEFT HEART CATH AND CORONARY ANGIOGRAPHY N/A 05/25/2017   Procedure: LEFT HEART CATH AND CORONARY ANGIOGRAPHY;  Surgeon: Sherren Mocha, MD;  Location: [facility];  Service: Cardiovascular;  Laterality: N/A;  . LOOP RECORDER IMPLANT    . TEE WITHOUT CARDIOVERSION N/A 12/17/2015   Procedure: TRANSESOPHAGEAL ECHOCARDIOGRAM (TEE);  Surgeon: Lelon Perla, MD;  Location: [facility] ENDOS[facility];  Service: Cardiovascular;  Laterality: N/A;  Pt also needs a LOOP  . TRANSTHORACIC ECHOCARDIOGRAM  2008   SHOWED LEFT VENTRICULAR HYPERTROPHY AND MILD AORTIC STENOSIS    There were no vitals filed for this visit.  Subjective Assessment - 09/26/18 1316    Subjective  doing about the same    Currently in Pain?  Yes    Pain Score  3     Pain Location  Back                       [facility] Adult PT Treatment/Exercise - 09/26/18 0001      Ambulation/Gait   Gait Comments  git HHA  progressing decreasing HHA to cuing pt to maintain cadeance, CSBA wiurh constant cuing needed      High Level Balance   High Level Balance Activities  Negotitating around obstacles;Negotiating over obstacles   HHA with cuing and LOB correction needed     Lumbar Exercises: Aerobic   UBE (Upper Arm Bike)  L 4 3 fwd/3back    Nustep  L 5 7 min      Knee/Hip Exercises: Standing   Other Standing Knee Exercises  vector stepping to include turns   min A with multi LOB backwards   Other Standing Knee Exercises  STS working on decreased post lean               PT Short Term Goals - 08/25/18 1153      PT SHORT TERM GOAL #1   Title  independent with her home HEP    Status  Achieved         PT Long Term Goals - 09/20/18 1417      PT LONG TERM GOAL #1   Title  decrease TUG time to 22 seconds for functional gait and safety    Baseline  with RW  34  sec, turns are difficult    Status  Partially Met      PT LONG TERM GOAL #2   Title  increase Berg balance score to 40/56 for safety    Status  On-going      PT LONG TERM GOAL #3   Title  increase LE strength (particularly ABD and ext.) to 4/5 for functional gait and safety    Status  Achieved      PT LONG TERM GOAL #4   Title  be able to do a sit to stand without assistance and without losing balance for functional use    Status  Partially Met            Plan - 09/26/18 1358    Clinical Impression Statement  continued with balance and gait. worked to [employer] cadeance when PTA releases HHA CSBA needed with constant cuing. pt has difficultly maintaining once HHA released she freezes up. Pt continues to have great difficulty with turns and LOB backwards    PT Treatment/Interventions  ADLs/Self Care Home Management;Electrical Stimulation;Moist Heat;Gait training;Stair training;Therapeutic activities;Functional mobility training;Therapeutic exercise;Patient/family education;Neuromuscular re-education;Balance training    PT Next Visit Plan  progress strength,gait and balance       Patient will benefit from skilled therapeutic intervention in order to improve the following deficits and impairments:  Abnormal gait, Pain, Postural dysfunction, Increased muscle spasms, Cardiopulmonary status limiting activity, Decreased activity tolerance, Decreased strength, Difficulty walking, Decreased balance  Visit Diagnosis: Difficulty in walking, not elsewhere classified  Muscle weakness (generalized)  Balance problem     Problem List Patient Active Problem List   Diagnosis Date Noted  . Familial hypercholesteremia 02/21/2018  . Psychogenic gait 10/21/2017  . Coronary artery disease involving native coronary  artery of native heart without angina pectoris 06/09/2017  . Abnormal nuclear cardiac imaging test 05/25/2017  . PVC's (premature ventricular contractions) 05/17/2017  . Anxiety 12/29/2016  . Vascular parkinsonism ([place]) 06/29/2016  . Gait disorder 04/07/2016  . Intracranial carotid stenosis, bilateral 02/27/2016  . Essential hypertension   . Left-sided weakness 12/28/2015  . Headache 12/28/2015  . Adjustment reaction with anxiety 12/20/2015  . Left hemiparesis ([place])   . History of CVA with residual deficit   . Prediabetes   . SVT (supraventricular tachycardia) ([place]) 12/14/2015  .  Stroke-like symptom 12/13/2015  . History of stroke   . Heart palpitations 09/06/2012  . Hypertension   . Familial hyperlipidemia   . Hypothyroidism   . Vitamin D deficiency   . Palpitations   . History of dizziness   . LVH (left ventricular hypertrophy)   . Aortic stenosis   . Mitral regurgitation   . SOB (shortness of breath)   . Difficulty walking     PAYSEUR,ANGIE PTA 09/26/2018, 2:00 PM  [place] [address] [place], [place], [address] Phone: [phone]   Fax:  [phone]  Name: Stacey Ramirez MRN: [id number] Date of Birth: 1941-01-20

## 2018-09-27 ENCOUNTER — Ambulatory Visit: Payer: Self-pay | Admitting: Physician Assistant

## 2018-09-29 ENCOUNTER — Ambulatory Visit: Payer: PPO | Admitting: Physical Therapy

## 2018-09-29 DIAGNOSIS — M6281 Muscle weakness (generalized): Secondary | ICD-10-CM

## 2018-09-29 DIAGNOSIS — R2689 Other abnormalities of gait and mobility: Secondary | ICD-10-CM

## 2018-09-29 DIAGNOSIS — R262 Difficulty in walking, not elsewhere classified: Secondary | ICD-10-CM

## 2018-09-29 NOTE — Therapy (Signed)
[place], [place], [address] Phone: [phone]   Fax:  [phone]  Physical Therapy Treatment  Patient Details  Name: Stacey Ramirez MRN: [id number] Date of Birth: Dec 15, 1940 Referring Provider (PT): Beane   Encounter Date: 09/29/2018  PT End of Session - 09/29/18 1228    Visit Number  12    Date for PT Re-Evaluation  10/14/18    PT Start Time  1150    PT Stop Time  1235    PT Time Calculation (min)  45 min       Past Medical History:  Diagnosis Date  . Adjustment reaction with anxiety 12/20/2015  . Aortic stenosis   . CAD (coronary artery disease) 06/09/2017   Nuc study 10/18: EF 69, inferolateral perfusion defect-possible small infarct with peri-infarct ischemia; intermediate risk // LHC 10/18: pLAD 25, pLCx 50, pRCA 25 >> med Rx  . Carotid stenosis, asymptomatic, bilateral 02/27/2016   Carotid US 5/17: Bilateral ICA 1-39  . Cerebrovascular accident (stroke) ([place])    a. 12/2015 - cryptogenic.  S/P MDT Linq.  . Difficulty walking   . Essential hypertension   . Gait disorder 04/07/2016  . Heart palpitations 09/06/2012  . History of CVA with residual deficit 12/14/2015  . History of dizziness   . History of echocardiogram    Echo 10/18: Vigorous LVF, EF 65-70, normal wall motion, grade 1 diastolic dysfunction, GLS -21.1%, mild RAE  . History of nuclear stress test    Nuclear stress test 10/18: EF 69, inf-lat defect c/w poss infarct with peri-infarct ischemia; Intermediate Risk  . Hyperlipidemia   . Hypothyroidism   . Left hemiparesis ([place])   . LVH (left ventricular hypertrophy)   . Mitral regurgitation    a. 12/2015 Echo: EF 60-65%, mild focal basal hypertrophy. No rwma, triv AI, mild MR, mildly dil LA w/o thrombus. No RA thrombus. No PFO.  Marland Kitchen Palpitations   . Prediabetes   . Stroke-like symptom 12/13/2015  . SVT (supraventricular tachycardia) ([place]) 12/14/2015  . Vascular parkinsonism ([place])  06/29/2016  . Vitamin D deficiency     Past Surgical History:  Procedure Laterality Date  . CARDIOVASCULAR STRESS TEST  2002   NORMAL  . EP IMPLANTABLE DEVICE N/A 12/17/2015   Procedure: Loop Recorder Insertion;  Surgeon: [name], MD;  Location: [facility];  Service: Cardiovascular;  Laterality: N/A;  . LEFT HEART CATH AND CORONARY ANGIOGRAPHY N/A 05/25/2017   Procedure: LEFT HEART CATH AND CORONARY ANGIOGRAPHY;  Surgeon: Sherren Mocha, MD;  Location: [facility];  Service: Cardiovascular;  Laterality: N/A;  . LOOP RECORDER IMPLANT    . TEE WITHOUT CARDIOVERSION N/A 12/17/2015   Procedure: TRANSESOPHAGEAL ECHOCARDIOGRAM (TEE);  Surgeon: Lelon Perla, MD;  Location: [facility] ENDOSCOPY;  Service: Cardiovascular;  Laterality: N/A;  Pt also needs a LOOP  . TRANSTHORACIC ECHOCARDIOGRAM  2008   SHOWED LEFT VENTRICULAR HYPERTROPHY AND MILD AORTIC STENOSIS    There were no vitals filed for this visit.  Subjective Assessment - 09/29/18 1154    Subjective  " I need to do more at home. I do well after being here and then it goes back. So tired all the time"    Currently in Pain?  Yes    Pain Score  3     Pain Location  Back  [facility] Adult PT Treatment/Exercise - 09/29/18 0001      Standardized Balance Assessment   Standardized Balance Assessment  Berg Balance Test      Berg Balance Test   Sit to Stand  Able to stand  independently using hands    Standing Unsupported  Able to stand 2 minutes with supervision    Sitting with Back Unsupported but Feet Supported on Floor or Stool  Able to sit safely and securely 2 minutes    Stand to Sit  Controls descent by using hands    Transfers  Able to transfer safely, definite need of hands    Standing Unsupported with Eyes Closed  Able to stand 10 seconds with supervision    Standing Ubsupported with Feet Together  Needs help to attain position but able to stand for 30 seconds with feet together     From Standing, Reach Forward with Outstretched Arm  Can reach forward >5 cm safely (2")    From Standing Position, Pick up Object from Floor  Unable to pick up shoe, but reaches 2-5 cm (1-2") from shoe and balances independently    From Standing Position, Turn to Look Behind Over each Shoulder  Turn sideways only but maintains balance    Turn 360 Degrees  Needs close supervision or verbal cueing    Standing Unsupported, Alternately Place Feet on Step/Stool  Able to complete >2 steps/needs minimal assist    Standing Unsupported, One Foot in [employer] balance while stepping or standing    Standing on One Leg  Tries to lift leg/unable to hold 3 seconds but remains standing independently    Total Score  29      Lumbar Exercises: Aerobic   T[place]  1 mph 5 min CGA   cues to increase Left stride and increase cadeance   UBE (Upper Arm Bike)  L 4 3 fwd/3back    Nustep  L 5 7 min      Lumbar Exercises: Standing   Other Standing Lumbar Exercises  alt LE kicks 20 times hip 3 way and marching with 2 hand support      Knee/Hip Exercises: Machines for Strengthening   Cybex Knee Extension  10# BIL 2 sets 10. 5# left 3 sets 5    Cybex Knee Flexion  15# left only 2 sets 10. BIL 25# 12 reps    Cybex Leg Press  30# 3 sets 10             PT Education - 09/29/18 1213    Education Details  edcu pt on doing something every hour at home walking ,leg kicks at counter or riding bike    Person(s) Educated  Patient;Spouse    Methods  Explanation;Demonstration    Comprehension  Verbalized understanding;Returned demonstration       PT Short Term Goals - 08/25/18 1153      PT SHORT TERM GOAL #1   Title  independent with her home HEP    Status  Achieved        PT Long Term Goals - 09/29/18 1206      PT LONG TERM GOAL #1   Title  decrease TUG time to 22 seconds for functional gait and safety    Baseline  with RW  32  sec, turns continue to be difficult    Status  Partially Met      PT  LONG TERM GOAL #2   Title  increase Berg balance score to 40/56 for  safety    Baseline  29/56    Status  Partially Met      PT LONG TERM GOAL #3   Title  increase LE strength (particularly ABD and ext.) to 4/5 for functional gait and safety    Status  Achieved      PT LONG TERM GOAL #4   Title  be able to do a sit to stand without assistance and without losing balance for functional use    Status  Partially Met            Plan - 09/29/18 1229    Clinical Impression Statement  focus on strength today. decreased ability to advance left foot today , it was get stuck. Assistance with all ex and btwn machines. BERG 29 so pt is still at very high risk to fall.  educ pt and spouse on hourly activity to get pt more active at home.    PT Treatment/Interventions  ADLs/Self Care Home Management;Electrical Stimulation;Moist Heat;Gait training;Stair training;Therapeutic activities;Functional mobility training;Therapeutic exercise;Patient/family education;Neuromuscular re-education;Balance training    PT Next Visit Plan  progress strength,gait and balance       Patient will benefit from skilled therapeutic intervention in order to improve the following deficits and impairments:  Abnormal gait, Pain, Postural dysfunction, Increased muscle spasms, Cardiopulmonary status limiting activity, Decreased activity tolerance, Decreased strength, Difficulty walking, Decreased balance  Visit Diagnosis: Difficulty in walking, not elsewhere classified  Muscle weakness (generalized)  Balance problem     Problem List Patient Active Problem List   Diagnosis Date Noted  . Familial hypercholesteremia 02/21/2018  . Psychogenic gait 10/21/2017  . Coronary artery disease involving native coronary artery of native heart without angina pectoris 06/09/2017  . Abnormal nuclear cardiac imaging test 05/25/2017  . PVC's (premature ventricular contractions) 05/17/2017  . Anxiety 12/29/2016  . Vascular  parkinsonism ([place]) 06/29/2016  . Gait disorder 04/07/2016  . Intracranial carotid stenosis, bilateral 02/27/2016  . Essential hypertension   . Left-sided weakness 12/28/2015  . Headache 12/28/2015  . Adjustment reaction with anxiety 12/20/2015  . Left hemiparesis ([place])   . History of CVA with residual deficit   . Prediabetes   . SVT (supraventricular tachycardia) ([place]) 12/14/2015  . Stroke-like symptom 12/13/2015  . History of stroke   . Heart palpitations 09/06/2012  . Hypertension   . Familial hyperlipidemia   . Hypothyroidism   . Vitamin D deficiency   . Palpitations   . History of dizziness   . LVH (left ventricular hypertrophy)   . Aortic stenosis   . Mitral regurgitation   . SOB (shortness of breath)   . Difficulty walking     ,ANGIE  PTA 09/29/2018, 12:30 PM  [place], [place], [address] Phone: [phone]   Fax:  [phone]  Name: Stacey Ramirez MRN: [id number] Date of Birth: 1941-06-25

## 2018-09-30 ENCOUNTER — Telehealth: Payer: Self-pay

## 2018-09-30 NOTE — Telephone Encounter (Signed)
I left a message for the pt to send a manual transmission from her home monitor. I left the office number for her to call back if she have any questions.

## 2018-10-03 DIAGNOSIS — Z1231 Encounter for screening mammogram for malignant neoplasm of breast: Secondary | ICD-10-CM | POA: Diagnosis not present

## 2018-10-03 NOTE — Telephone Encounter (Signed)
Spoke w/ pt and requested that she send a manual transmission w/ her home monitor. Pt verbalized understanding.  

## 2018-10-04 ENCOUNTER — Ambulatory Visit: Payer: PPO | Attending: Specialist | Admitting: Physical Therapy

## 2018-10-04 DIAGNOSIS — R262 Difficulty in walking, not elsewhere classified: Secondary | ICD-10-CM | POA: Diagnosis not present

## 2018-10-04 DIAGNOSIS — M6281 Muscle weakness (generalized): Secondary | ICD-10-CM | POA: Diagnosis not present

## 2018-10-04 DIAGNOSIS — R2689 Other abnormalities of gait and mobility: Secondary | ICD-10-CM | POA: Insufficient documentation

## 2018-10-04 NOTE — Therapy (Signed)
[place], [place], [address] Phone: [phone]   Fax:  [phone]  Physical Therapy Treatment  Patient Details  Name: Stacey Ramirez MRN: [id number] Date of Birth: Jul 23, 1941 Referring Provider (PT): Beane   Encounter Date: 10/04/2018  PT End of Session - 10/04/18 1410    Visit Number  13    Date for PT Re-Evaluation  10/14/18    PT Start Time  1310    PT Stop Time  1400    PT Time Calculation (min)  50 min       Past Medical History:  Diagnosis Date  . Adjustment reaction with anxiety 12/20/2015  . Aortic stenosis   . CAD (coronary artery disease) 06/09/2017   Nuc study 10/18: EF 69, inferolateral perfusion defect-possible small infarct with peri-infarct ischemia; intermediate risk // LHC 10/18: pLAD 25, pLCx 50, pRCA 25 >> med Rx  . Carotid stenosis, asymptomatic, bilateral 02/27/2016   Carotid US 5/17: Bilateral ICA 1-39  . Cerebrovascular accident (stroke) ([place])    a. 12/2015 - cryptogenic.  S/P MDT Linq.  . Difficulty walking   . Essential hypertension   . Gait disorder 04/07/2016  . Heart palpitations 09/06/2012  . History of CVA with residual deficit 12/14/2015  . History of dizziness   . History of echocardiogram    Echo 10/18: Vigorous LVF, EF 65-70, normal wall motion, grade 1 diastolic dysfunction, GLS -21.1%, mild RAE  . History of nuclear stress test    Nuclear stress test 10/18: EF 69, inf-lat defect c/w poss infarct with peri-infarct ischemia; Intermediate Risk  . Hyperlipidemia   . Hypothyroidism   . Left hemiparesis ([place])   . LVH (left ventricular hypertrophy)   . Mitral regurgitation    a. 12/2015 Echo: EF 60-65%, mild focal basal hypertrophy. No rwma, triv AI, mild MR, mildly dil LA w/o thrombus. No RA thrombus. No PFO.  Marland Kitchen Palpitations   . Prediabetes   . Stroke-like symptom 12/13/2015  . SVT (supraventricular tachycardia) ([place]) 12/14/2015  . Vascular parkinsonism ([place])  06/29/2016  . Vitamin D deficiency     Past Surgical History:  Procedure Laterality Date  . CARDIOVASCULAR STRESS TEST  2002   NORMAL  . EP IMPLANTABLE DEVICE N/A 12/17/2015   Procedure: Loop Recorder Insertion;  Surgeon: [name], MD;  Location: [facility];  Service: Cardiovascular;  Laterality: N/A;  . LEFT HEART CATH AND CORONARY ANGIOGRAPHY N/A 05/25/2017   Procedure: LEFT HEART CATH AND CORONARY ANGIOGRAPHY;  Surgeon: Sherren Mocha, MD;  Location: [facility];  Service: Cardiovascular;  Laterality: N/A;  . LOOP RECORDER IMPLANT    . TEE WITHOUT CARDIOVERSION N/A 12/17/2015   Procedure: TRANSESOPHAGEAL ECHOCARDIOGRAM (TEE);  Surgeon: Lelon Perla, MD;  Location: [facility] ENDOSCOPY;  Service: Cardiovascular;  Laterality: N/A;  Pt also needs a LOOP  . TRANSTHORACIC ECHOCARDIOGRAM  2008   SHOWED LEFT VENTRICULAR HYPERTROPHY AND MILD AORTIC STENOSIS    There were no vitals filed for this visit.  Subjective Assessment - 10/04/18 1318    Subjective  good and bad days. trying to work more at home. sometimes left leg just doesn't go    Currently in Pain?  No/denies                       [facility] Adult PT Treatment/Exercise - 10/04/18 0001      Ambulation/Gait   Gait Comments  working on amb HHA to  increase cadeance and then let go and VCing to continue gait.      High Level Balance   High Level Balance Comments  ball toss ,various stance , min A   mod. A with wt posterior and falling back     Lumbar Exercises: Aerobic   Elliptical  5 min L 4 I 4   working in stride    [employer]  1 mph 5 min CGA   work to increase left stride     Knee/Hip Exercises: Machines for Strengthening   Cybex Knee Extension  10# BIL 2 sets 10. 5# left 3 sets 5      Knee/Hip Exercises: Standing   Forward Step Up  Both;1 set;10 reps;Hand Hold: 2;Hand Hold: 1;Step Height: 6"    Other Standing Knee Exercises  HHA alt step tap, min A      Knee/Hip Exercises: Seated   Other  Seated Knee/Hip Exercises  chair scoots 2 laps   recipricol LE   Sit to Sand  2 sets;5 reps;without UE support   wt ball, tactile and VCing              PT Short Term Goals - 08/25/18 1153      PT SHORT TERM GOAL #1   Title  independent with her home HEP    Status  Achieved        PT Long Term Goals - 09/29/18 1206      PT LONG TERM GOAL #1   Title  decrease TUG time to 22 seconds for functional gait and safety    Baseline  with RW  32  sec, turns continue to be difficult    Status  Partially Met      PT LONG TERM GOAL #2   Title  increase Berg balance score to 40/56 for safety    Baseline  29/56    Status  Partially Met      PT LONG TERM GOAL #3   Title  increase LE strength (particularly ABD and ext.) to 4/5 for functional gait and safety    Status  Achieved      PT LONG TERM GOAL #4   Title  be able to do a sit to stand without assistance and without losing balance for functional use    Status  Partially Met            Plan - 10/04/18 1411    Clinical Impression Statement  pt did very well with elliptucal and TM ( other than SOB and fatigued) however had a very hard time today with gait and balance,posterior lean with multi LOB requiring assistance and constant cuing    PT Treatment/Interventions  ADLs/Self Care Home Management;Electrical Stimulation;Moist Heat;Gait training;Stair training;Therapeutic activities;Functional mobility training;Therapeutic exercise;Patient/family education;Neuromuscular re-education;Balance training    PT Next Visit Plan  assess goals       Patient will benefit from skilled therapeutic intervention in order to improve the following deficits and impairments:  Abnormal gait, Pain, Postural dysfunction, Increased muscle spasms, Cardiopulmonary status limiting activity, Decreased activity tolerance, Decreased strength, Difficulty walking, Decreased balance  Visit Diagnosis: Difficulty in walking, not elsewhere  classified  Balance problem  Muscle weakness (generalized)     Problem List Patient Active Problem List   Diagnosis Date Noted  . Familial hypercholesteremia 02/21/2018  . Psychogenic gait 10/21/2017  . Coronary artery disease involving native coronary artery of native heart without angina pectoris 06/09/2017  . Abnormal nuclear cardiac imaging test 05/25/2017  .  PVC's (premature ventricular contractions) 05/17/2017  . Anxiety 12/29/2016  . Vascular parkinsonism ([place]) 06/29/2016  . Gait disorder 04/07/2016  . Intracranial carotid stenosis, bilateral 02/27/2016  . Essential hypertension   . Left-sided weakness 12/28/2015  . Headache 12/28/2015  . Adjustment reaction with anxiety 12/20/2015  . Left hemiparesis ([place])   . History of CVA with residual deficit   . Prediabetes   . SVT (supraventricular tachycardia) ([place]) 12/14/2015  . Stroke-like symptom 12/13/2015  . History of stroke   . Heart palpitations 09/06/2012  . Hypertension   . Familial hyperlipidemia   . Hypothyroidism   . Vitamin D deficiency   . Palpitations   . History of dizziness   . LVH (left ventricular hypertrophy)   . Aortic stenosis   . Mitral regurgitation   . SOB (shortness of breath)   . Difficulty walking     Guage Efferson,ANGIE PTA 10/04/2018, 2:12 PM  [place], [place], [address] Phone: [phone]   Fax:  [phone]  Name: Stacey Ramirez MRN: [id number] Date of Birth: 09-02-1940

## 2018-10-04 NOTE — Telephone Encounter (Signed)
Manual transmission received. Full report shows false AF episodes--SR w/PACs and rare PVCs per ECGs. Some brief runs of disorganized atrial arrhthythmias noted. ECGs printed and placed in Dr. Otilio Connors folder for review.

## 2018-10-06 ENCOUNTER — Ambulatory Visit: Payer: PPO | Admitting: Physical Therapy

## 2018-10-06 DIAGNOSIS — R8290 Unspecified abnormal findings in urine: Secondary | ICD-10-CM | POA: Diagnosis not present

## 2018-10-06 DIAGNOSIS — N390 Urinary tract infection, site not specified: Secondary | ICD-10-CM | POA: Diagnosis not present

## 2018-10-10 ENCOUNTER — Ambulatory Visit (INDEPENDENT_AMBULATORY_CARE_PROVIDER_SITE_OTHER): Payer: PPO | Admitting: *Deleted

## 2018-10-10 DIAGNOSIS — I639 Cerebral infarction, unspecified: Secondary | ICD-10-CM

## 2018-10-10 DIAGNOSIS — G20 Parkinson's disease: Secondary | ICD-10-CM | POA: Diagnosis not present

## 2018-10-10 DIAGNOSIS — R269 Unspecified abnormalities of gait and mobility: Secondary | ICD-10-CM | POA: Diagnosis not present

## 2018-10-11 ENCOUNTER — Ambulatory Visit: Payer: PPO | Admitting: Physical Therapy

## 2018-10-11 DIAGNOSIS — M6281 Muscle weakness (generalized): Secondary | ICD-10-CM

## 2018-10-11 DIAGNOSIS — R262 Difficulty in walking, not elsewhere classified: Secondary | ICD-10-CM | POA: Diagnosis not present

## 2018-10-11 DIAGNOSIS — R2689 Other abnormalities of gait and mobility: Secondary | ICD-10-CM

## 2018-10-11 LAB — CUP PACEART REMOTE DEVICE CHECK
Implantable Pulse Generator Implant Date: 20170516
MDC IDC SESS DTM: 20200308163934

## 2018-10-11 NOTE — Therapy (Signed)
[facility] [place], [place], [address] Phone: [phone]   Fax:  [phone]  Physical Therapy Treatment  Patient Details  Name: Stacey Ramirez MRN: [id number] Date of Birth: 1941/01/23 Referring Provider (PT): Beane   Encounter Date: 10/11/2018  PT End of Session - 10/11/18 1240    Visit Number  14    Date for PT Re-Evaluation  10/14/18    PT Start Time  1150    PT Stop Time  3329    PT Time Calculation (min)  45 min       Past Medical History:  Diagnosis Date  . Adjustment reaction with anxiety 12/20/2015  . Aortic stenosis   . CAD (coronary artery disease) 06/09/2017   Nuc study 10/18: EF 69, inferolateral perfusion defect-possible small infarct with peri-infarct ischemia; intermediate risk // LHC 10/18: pLAD 25, pLCx 50, pRCA 25 >> med Rx  . Carotid stenosis, asymptomatic, bilateral 02/27/2016   Carotid US 5/17: Bilateral ICA 1-39  . Cerebrovascular accident (stroke) ([place])    a. 12/2015 - cryptogenic.  S/P MDT Linq.  . Difficulty walking   . Essential hypertension   . Gait disorder 04/07/2016  . Heart palpitations 09/06/2012  . History of CVA with residual deficit 12/14/2015  . History of dizziness   . History of echocardiogram    Echo 10/18: Vigorous LVF, EF 65-70, normal wall motion, grade 1 diastolic dysfunction, GLS -21.1%, mild RAE  . History of nuclear stress test    Nuclear stress test 10/18: EF 69, inf-lat defect c/w poss infarct with peri-infarct ischemia; Intermediate Risk  . Hyperlipidemia   . Hypothyroidism   . Left hemiparesis ([place])   . LVH (left ventricular hypertrophy)   . Mitral regurgitation    a. 12/2015 Echo: EF 60-65%, mild focal basal hypertrophy. No rwma, triv AI, mild MR, mildly dil LA w/o thrombus. No RA thrombus. No PFO.  Marland Kitchen Palpitations   . Prediabetes   . Stroke-like symptom 12/13/2015  . SVT (supraventricular tachycardia) ([place]) 12/14/2015  . Vascular parkinsonism ([place])  06/29/2016  . Vitamin D deficiency     Past Surgical History:  Procedure Laterality Date  . CARDIOVASCULAR STRESS TEST  2002   NORMAL  . EP IMPLANTABLE DEVICE N/A 12/17/2015   Procedure: Loop Recorder Insertion;  Surgeon: [name], MD;  Location: [facility] [facility];  Service: Cardiovascular;  Laterality: N/A;  . LEFT HEART CATH AND CORONARY ANGIOGRAPHY N/A 05/25/2017   Procedure: LEFT HEART CATH AND CORONARY ANGIOGRAPHY;  Surgeon: Sherren Mocha, MD;  Location: [place] [facility];  Service: Cardiovascular;  Laterality: N/A;  . LOOP RECORDER IMPLANT    . TEE WITHOUT CARDIOVERSION N/A 12/17/2015   Procedure: TRANSESOPHAGEAL ECHOCARDIOGRAM (TEE);  Surgeon: Lelon Perla, MD;  Location: [facility] ENDOSCOPY;  Service: Cardiovascular;  Laterality: N/A;  Pt also needs a LOOP  . TRANSTHORACIC ECHOCARDIOGRAM  2008   SHOWED LEFT VENTRICULAR HYPERTROPHY AND MILD AORTIC STENOSIS    There were no vitals filed for this visit.  Subjective Assessment - 10/11/18 1154    Subjective  achey and tired ( been fighting a bladder infection, that is why I cancel last week)    Currently in Pain?  No/denies                       [facility] Adult PT Treatment/Exercise - 10/11/18 0001      Ambulation/Gait   Gait Comments  working on amb HHA to  increase cadeance and stride around obstacles for turns.      Lumbar Exercises: Aerobic   Tread Mill  1 mph 5 min CGA   cuing to increase left stride   Nustep  L 5 7 min      Lumbar Exercises: Seated   Sit to Stand  20 reps   sitting on airex for increased height, CGA cued to stay fwd     Knee/Hip Exercises: Machines for Strengthening   Cybex Knee Extension  10# BIL 2 sets 10. 5# left 3 sets 5    Cybex Knee Flexion  15# left only 2 sets 10. BIL 25# 12 reps      Knee/Hip Exercises: Standing   Walking with Sports Cord  30# 5x fwd/back and 3 times each side   mod A esp with RT leg leading, maxing cuing     Shoulder Exercises: Standing   ABduction   Strengthening;Both;10 reps;Theraband    Theraband Level (Shoulder ABduction)  Level 2 (Red)    Extension  Strengthening;10 reps;Theraband;Other (comment)    Theraband Level (Shoulder Extension)  Level 2 (Red)    Row  Strengthening;Both;10 reps;Theraband    Theraband Level (Shoulder Row)  Level 2 (Red)               PT Short Term Goals - 08/25/18 1153      PT SHORT TERM GOAL #1   Title  independent with her home HEP    Status  Achieved        PT Long Term Goals - 09/29/18 1206      PT LONG TERM GOAL #1   Title  decrease TUG time to 22 seconds for functional gait and safety    Baseline  with RW  32  sec, turns continue to be difficult    Status  Partially Met      PT LONG TERM GOAL #2   Title  increase Berg balance score to 40/56 for safety    Baseline  29/56    Status  Partially Met      PT LONG TERM GOAL #3   Title  increase LE strength (particularly ABD and ext.) to 4/5 for functional gait and safety    Status  Achieved      PT LONG TERM GOAL #4   Title  be able to do a sit to stand without assistance and without losing balance for functional use    Status  Partially Met            Plan - 10/11/18 1241    Clinical Impression Statement  pt with decreased step on left today esp with runs and resisted gait. cuing needed throughout session as well as physical assistance for LOB    PT Treatment/Interventions  ADLs/Self Care Home Management;Electrical Stimulation;Moist Heat;Gait training;Stair training;Therapeutic activities;Functional mobility training;Therapeutic exercise;Patient/family education;Neuromuscular re-education;Balance training    PT Next Visit Plan  progress with gait and balance, check TUG and BERG       Patient will benefit from skilled therapeutic intervention in order to improve the following deficits and impairments:  Abnormal gait, Pain, Postural dysfunction, Increased muscle spasms, Cardiopulmonary status limiting activity, Decreased  activity tolerance, Decreased strength, Difficulty walking, Decreased balance  Visit Diagnosis: Difficulty in walking, not elsewhere classified  Balance problem  Muscle weakness (generalized)     Problem List Patient Active Problem List   Diagnosis Date Noted  . Familial hypercholesteremia 02/21/2018  . Psychogenic gait 10/21/2017  . Coronary artery disease involving native  coronary artery of native heart without angina pectoris 06/09/2017  . Abnormal nuclear cardiac imaging test 05/25/2017  . PVC's (premature ventricular contractions) 05/17/2017  . Anxiety 12/29/2016  . Vascular parkinsonism ([place]) 06/29/2016  . Gait disorder 04/07/2016  . Intracranial carotid stenosis, bilateral 02/27/2016  . Essential hypertension   . Left-sided weakness 12/28/2015  . Headache 12/28/2015  . Adjustment reaction with anxiety 12/20/2015  . Left hemiparesis ([place])   . History of CVA with residual deficit   . Prediabetes   . SVT (supraventricular tachycardia) ([place]) 12/14/2015  . Stroke-like symptom 12/13/2015  . History of stroke   . Heart palpitations 09/06/2012  . Hypertension   . Familial hyperlipidemia   . Hypothyroidism   . Vitamin D deficiency   . Palpitations   . History of dizziness   . LVH (left ventricular hypertrophy)   . Aortic stenosis   . Mitral regurgitation   . SOB (shortness of breath)   . Difficulty walking     Nandi Tonnesen,ANGIE PTA 10/11/2018, 12:43 PM  [facility], [place], [address] Phone: [phone]   Fax:  [phone]  Name: MIKKA KISSNER MRN: [id number] Date of Birth: Sep 14, 1940

## 2018-10-13 ENCOUNTER — Ambulatory Visit: Payer: PPO | Admitting: Physical Therapy

## 2018-10-13 ENCOUNTER — Other Ambulatory Visit: Payer: Self-pay

## 2018-10-13 DIAGNOSIS — R262 Difficulty in walking, not elsewhere classified: Secondary | ICD-10-CM

## 2018-10-13 DIAGNOSIS — M6281 Muscle weakness (generalized): Secondary | ICD-10-CM

## 2018-10-13 DIAGNOSIS — R2689 Other abnormalities of gait and mobility: Secondary | ICD-10-CM

## 2018-10-13 NOTE — Therapy (Signed)
[facility], [place], [address] Phone: [phone]   Fax:  [phone]  Physical Therapy Treatment  Patient Details  Name: Stacey Ramirez MRN: [id number] Date of Birth: 11-25-1940 Referring Provider (PT): Beane   Encounter Date: 10/13/2018  PT End of Session - 10/13/18 1429    Visit Number  15    Date for PT Re-Evaluation  10/14/18    PT Start Time  1400    PT Stop Time  1450    PT Time Calculation (min)  50 min       Past Medical History:  Diagnosis Date  . Adjustment reaction with anxiety 12/20/2015  . Aortic stenosis   . CAD (coronary artery disease) 06/09/2017   Nuc study 10/18: EF 69, inferolateral perfusion defect-possible small infarct with peri-infarct ischemia; intermediate risk // LHC 10/18: pLAD 25, pLCx 50, pRCA 25 >> med Rx  . Carotid stenosis, asymptomatic, bilateral 02/27/2016   Carotid US 5/17: Bilateral ICA 1-39  . Cerebrovascular accident (stroke) ([place])    a. 12/2015 - cryptogenic.  S/P MDT Linq.  . Difficulty walking   . Essential hypertension   . Gait disorder 04/07/2016  . Heart palpitations 09/06/2012  . History of CVA with residual deficit 12/14/2015  . History of dizziness   . History of echocardiogram    Echo 10/18: Vigorous LVF, EF 65-70, normal wall motion, grade 1 diastolic dysfunction, GLS -21.1%, mild RAE  . History of nuclear stress test    Nuclear stress test 10/18: EF 69, inf-lat defect c/w poss infarct with peri-infarct ischemia; Intermediate Risk  . Hyperlipidemia   . Hypothyroidism   . Left hemiparesis ([place])   . LVH (left ventricular hypertrophy)   . Mitral regurgitation    a. 12/2015 Echo: EF 60-65%, mild focal basal hypertrophy. No rwma, triv AI, mild MR, mildly dil LA w/o thrombus. No RA thrombus. No PFO.  Marland Kitchen Palpitations   . Prediabetes   . Stroke-like symptom 12/13/2015  . SVT (supraventricular tachycardia) ([place]) 12/14/2015  . Vascular parkinsonism ([place])  06/29/2016  . Vitamin D deficiency     Past Surgical History:  Procedure Laterality Date  . CARDIOVASCULAR STRESS TEST  2002   NORMAL  . EP IMPLANTABLE DEVICE N/A 12/17/2015   Procedure: Loop Recorder Insertion;  Surgeon: [name], MD;  Location: [place] [facility];  Service: Cardiovascular;  Laterality: N/A;  . LEFT HEART CATH AND CORONARY ANGIOGRAPHY N/A 05/25/2017   Procedure: LEFT HEART CATH AND CORONARY ANGIOGRAPHY;  Surgeon: Sherren Mocha, MD;  Location: [place] [facility];  Service: Cardiovascular;  Laterality: N/A;  . LOOP RECORDER IMPLANT    . TEE WITHOUT CARDIOVERSION N/A 12/17/2015   Procedure: TRANSESOPHAGEAL ECHOCARDIOGRAM (TEE);  Surgeon: Lelon Perla, MD;  Location: [facility] ENDOSCOPY;  Service: Cardiovascular;  Laterality: N/A;  Pt also needs a LOOP  . TRANSTHORACIC ECHOCARDIOGRAM  2008   SHOWED LEFT VENTRICULAR HYPERTROPHY AND MILD AORTIC STENOSIS    There were no vitals filed for this visit.  Subjective Assessment - 10/13/18 1401    Subjective  doing okay- I think last 2 days left leg moving better    Currently in Pain?  No/denies                       [facility] Adult PT Treatment/Exercise - 10/13/18 0001      Ambulation/Gait   Stairs  Yes    Stairs Assistance  4: Min guard  Stair Management Technique  Alternating pattern    Number of Stairs  16   HHA   Gait Comments  side stepping up and down stairs 16 steps 1 time each way min A   amb to car without AD 200 feet CGA with good stride     Standardized Balance Assessment   Standardized Balance Assessment  Berg Balance Test      Berg Balance Test   Sit to Stand  Able to stand  independently using hands    Standing Unsupported  Able to stand safely 2 minutes    Sitting with Back Unsupported but Feet Supported on Floor or Stool  Able to sit safely and securely 2 minutes    Stand to Sit  Sits safely with minimal use of hands    Transfers  Able to transfer safely, definite need of hands     Standing Unsupported with Eyes Closed  Able to stand 10 seconds safely    Standing Ubsupported with Feet Together  Able to place feet together independently and stand for 1 minute with supervision    From Standing, Reach Forward with Outstretched Arm  Can reach forward >12 cm safely (5")    From Standing Position, Pick up Object from Floor  Unable to pick up shoe, but reaches 2-5 cm (1-2") from shoe and balances independently    From Standing Position, Turn to Look Behind Over each Shoulder  Looks behind one side only/other side shows less weight shift    Turn 360 Degrees  Able to turn 360 degrees safely but slowly    Standing Unsupported, Alternately Place Feet on Step/Stool  Able to complete >2 steps/needs minimal assist    Standing Unsupported, One Foot in [employer] balance while stepping or standing    Standing on One Leg  Unable to try or needs assist to prevent fall    Total Score  36      High Level Balance   High Level Balance Comments  ball toss with gait      Lumbar Exercises: Aerobic   Elliptical  2 min fwd/ 2 min back  for reciprocol mvmt    Nustep  L 5 7 min               PT Short Term Goals - 08/25/18 1153      PT SHORT TERM GOAL #1   Title  independent with her home HEP    Status  Achieved        PT Long Term Goals - 10/13/18 1407      PT LONG TERM GOAL #1   Title  decrease TUG time to 22 seconds for functional gait and safety    Baseline  27 sec with RW    Status  Partially Met      PT LONG TERM GOAL #2   Title  increase Berg balance score to 40/56 for safety    Baseline  36/56    Status  Partially Met      PT LONG TERM GOAL #3   Title  increase LE strength (particularly ABD and ext.) to 4/5 for functional gait and safety    Baseline  4/5 hips and knees tested in sitting    Status  Achieved      PT LONG TERM GOAL #4   Title  be able to do a sit to stand without assistance and without losing balance for functional use    Baseline  pt still  slow  with STS and initial balance with when starting gait    Status  Partially Met            Plan - 10/13/18 1429    Clinical Impression Statement  significant improvement in TUG and BERG score. noted increased left stride with tx today    PT Treatment/Interventions  ADLs/Self Care Home Management;Electrical Stimulation;Moist Heat;Gait training;Stair training;Therapeutic activities;Functional mobility training;Therapeutic exercise;Patient/family education;Neuromuscular re-education;Balance training    PT Next Visit Plan  progress with gait and balance, LE strength       Patient will benefit from skilled therapeutic intervention in order to improve the following deficits and impairments:  Abnormal gait, Pain, Postural dysfunction, Increased muscle spasms, Cardiopulmonary status limiting activity, Decreased activity tolerance, Decreased strength, Difficulty walking, Decreased balance  Visit Diagnosis: Difficulty in walking, not elsewhere classified  Balance problem  Muscle weakness (generalized)     Problem List Patient Active Problem List   Diagnosis Date Noted  . Familial hypercholesteremia 02/21/2018  . Psychogenic gait 10/21/2017  . Coronary artery disease involving native coronary artery of native heart without angina pectoris 06/09/2017  . Abnormal nuclear cardiac imaging test 05/25/2017  . PVC's (premature ventricular contractions) 05/17/2017  . Anxiety 12/29/2016  . Vascular parkinsonism ([place]) 06/29/2016  . Gait disorder 04/07/2016  . Intracranial carotid stenosis, bilateral 02/27/2016  . Essential hypertension   . Left-sided weakness 12/28/2015  . Headache 12/28/2015  . Adjustment reaction with anxiety 12/20/2015  . Left hemiparesis ([place])   . History of CVA with residual deficit   . Prediabetes   . SVT (supraventricular tachycardia) ([place]) 12/14/2015  . Stroke-like symptom 12/13/2015  . History of stroke   . Heart palpitations 09/06/2012  . Hypertension   .  Familial hyperlipidemia   . Hypothyroidism   . Vitamin D deficiency   . Palpitations   . History of dizziness   . LVH (left ventricular hypertrophy)   . Aortic stenosis   . Mitral regurgitation   . SOB (shortness of breath)   . Difficulty walking     Jaydan Chretien,ANGIE PTA 10/13/2018, 2:51 PM  [place], [place], [address] Phone: [phone]   Fax:  [phone]  Name: Stacey Ramirez MRN: [id number] Date of Birth: 17-Jun-1941

## 2018-10-17 NOTE — Progress Notes (Signed)
Carelink Summary Report / Loop Recorder 

## 2018-10-18 ENCOUNTER — Ambulatory Visit: Payer: PPO | Admitting: Physical Therapy

## 2018-10-20 ENCOUNTER — Ambulatory Visit: Payer: PPO | Admitting: Physical Therapy

## 2018-11-01 ENCOUNTER — Other Ambulatory Visit: Payer: Self-pay | Admitting: Internal Medicine

## 2018-11-10 DIAGNOSIS — R269 Unspecified abnormalities of gait and mobility: Secondary | ICD-10-CM | POA: Diagnosis not present

## 2018-11-10 DIAGNOSIS — G20 Parkinson's disease: Secondary | ICD-10-CM | POA: Diagnosis not present

## 2018-11-11 ENCOUNTER — Other Ambulatory Visit: Payer: Self-pay

## 2018-11-11 ENCOUNTER — Ambulatory Visit (INDEPENDENT_AMBULATORY_CARE_PROVIDER_SITE_OTHER): Payer: PPO | Admitting: *Deleted

## 2018-11-11 DIAGNOSIS — I639 Cerebral infarction, unspecified: Secondary | ICD-10-CM

## 2018-11-12 LAB — CUP PACEART REMOTE DEVICE CHECK
Date Time Interrogation Session: 20200410173933
Implantable Pulse Generator Implant Date: 20170516

## 2018-11-18 NOTE — Progress Notes (Signed)
Carelink Summary Report / Loop Recorder 

## 2018-11-28 DIAGNOSIS — M25473 Effusion, unspecified ankle: Secondary | ICD-10-CM | POA: Diagnosis not present

## 2018-11-28 DIAGNOSIS — M79606 Pain in leg, unspecified: Secondary | ICD-10-CM | POA: Diagnosis not present

## 2018-11-28 DIAGNOSIS — I10 Essential (primary) hypertension: Secondary | ICD-10-CM | POA: Diagnosis not present

## 2018-11-28 DIAGNOSIS — R5382 Chronic fatigue, unspecified: Secondary | ICD-10-CM | POA: Diagnosis not present

## 2018-11-28 DIAGNOSIS — E039 Hypothyroidism, unspecified: Secondary | ICD-10-CM | POA: Diagnosis not present

## 2018-12-10 DIAGNOSIS — R269 Unspecified abnormalities of gait and mobility: Secondary | ICD-10-CM | POA: Diagnosis not present

## 2018-12-10 DIAGNOSIS — G20 Parkinson's disease: Secondary | ICD-10-CM | POA: Diagnosis not present

## 2018-12-12 ENCOUNTER — Ambulatory Visit: Payer: PPO | Attending: Specialist | Admitting: Physical Therapy

## 2018-12-12 ENCOUNTER — Encounter: Payer: Self-pay | Admitting: Physical Therapy

## 2018-12-12 ENCOUNTER — Other Ambulatory Visit: Payer: Self-pay

## 2018-12-12 DIAGNOSIS — R262 Difficulty in walking, not elsewhere classified: Secondary | ICD-10-CM | POA: Insufficient documentation

## 2018-12-12 DIAGNOSIS — M6281 Muscle weakness (generalized): Secondary | ICD-10-CM

## 2018-12-12 DIAGNOSIS — R2689 Other abnormalities of gait and mobility: Secondary | ICD-10-CM | POA: Insufficient documentation

## 2018-12-12 DIAGNOSIS — R296 Repeated falls: Secondary | ICD-10-CM | POA: Insufficient documentation

## 2018-12-12 DIAGNOSIS — G20 Parkinson's disease: Secondary | ICD-10-CM | POA: Insufficient documentation

## 2018-12-12 DIAGNOSIS — G8194 Hemiplegia, unspecified affecting left nondominant side: Secondary | ICD-10-CM | POA: Diagnosis not present

## 2018-12-12 NOTE — Therapy (Signed)
[place], [place], [address] Phone: [phone]   Fax:  [phone]  Physical Therapy Evaluation  Patient Details  Name: Stacey Ramirez MRN: [id number] Date of Birth: 12/29/1940 Referring Provider (PT): Pharr   Encounter Date: 12/12/2018  PT End of Session - 12/12/18 1347    Visit Number  1    Number of Visits  16    Date for PT Re-Evaluation  02/11/19    PT Start Time  6962    PT Stop Time  1348    PT Time Calculation (min)  53 min    Activity Tolerance  Patient tolerated treatment well    Behavior During Therapy  [facility] for tasks assessed/performed       Past Medical History:  Diagnosis Date  . Adjustment reaction with anxiety 12/20/2015  . Aortic stenosis   . CAD (coronary artery disease) 06/09/2017   Nuc study 10/18: EF 69, inferolateral perfusion defect-possible small infarct with peri-infarct ischemia; intermediate risk // LHC 10/18: pLAD 25, pLCx 50, pRCA 25 >> med Rx  . Carotid stenosis, asymptomatic, bilateral 02/27/2016   Carotid US 5/17: Bilateral ICA 1-39  . Cerebrovascular accident (stroke) ([place])    a. 12/2015 - cryptogenic.  S/P MDT Linq.  . Difficulty walking   . Essential hypertension   . Gait disorder 04/07/2016  . Heart palpitations 09/06/2012  . History of CVA with residual deficit 12/14/2015  . History of dizziness   . History of echocardiogram    Echo 10/18: Vigorous LVF, EF 65-70, normal wall motion, grade 1 diastolic dysfunction, GLS -21.1%, mild RAE  . History of nuclear stress test    Nuclear stress test 10/18: EF 69, inf-lat defect c/w poss infarct with peri-infarct ischemia; Intermediate Risk  . Hyperlipidemia   . Hypothyroidism   . Left hemiparesis ([place])   . LVH (left ventricular hypertrophy)   . Mitral regurgitation    a. 12/2015 Echo: EF 60-65%, mild focal basal hypertrophy. No rwma, triv AI, mild MR, mildly dil LA w/o thrombus. No RA thrombus. No PFO.  Marland Kitchen  Palpitations   . Prediabetes   . Stroke-like symptom 12/13/2015  . SVT (supraventricular tachycardia) ([place]) 12/14/2015  . Vascular parkinsonism ([place]) 06/29/2016  . Vitamin D deficiency     Past Surgical History:  Procedure Laterality Date  . CARDIOVASCULAR STRESS TEST  2002   NORMAL  . EP IMPLANTABLE DEVICE N/A 12/17/2015   Procedure: Loop Recorder Insertion;  Surgeon: [name], MD;  Location: [place] [facility];  Service: Cardiovascular;  Laterality: N/A;  . LEFT HEART CATH AND CORONARY ANGIOGRAPHY N/A 05/25/2017   Procedure: LEFT HEART CATH AND CORONARY ANGIOGRAPHY;  Surgeon: Sherren Mocha, MD;  Location: [place] [facility];  Service: Cardiovascular;  Laterality: N/A;  . LOOP RECORDER IMPLANT    . TEE WITHOUT CARDIOVERSION N/A 12/17/2015   Procedure: TRANSESOPHAGEAL ECHOCARDIOGRAM (TEE);  Surgeon: Lelon Perla, MD;  Location: [facility] ENDOSCOPY;  Service: Cardiovascular;  Laterality: N/A;  Pt also needs a LOOP  . TRANSTHORACIC ECHOCARDIOGRAM  2008   SHOWED LEFT VENTRICULAR HYPERTROPHY AND MILD AORTIC STENOSIS    There were no vitals filed for this visit.   Subjective Assessment - 12/12/18 1259    Subjective  Patient was being seen here earlier this year due to weakness, and balance issues, difficulty walking with past strokes.  We stopped seeing her two months ago due to the Covid-19 issues.  Her husband tells me that he  has not been able to get her to do any exercises and feels like she has really gone down hill.      Pertinent History  Parkinson's, multiple falls    Limitations  Walking    Patient Stated Goals  decrease the falls, feel stronger    Currently in Pain?  No/denies         [facility] PT Assessment - 12/12/18 0001      Assessment   Medical Diagnosis  frequent falls, weakness and difficulty walking    Referring Provider (PT)  Pharr    Onset Date/Surgical Date  12/02/18    Prior Therapy  yes multiple times for falling      Precautions   Precautions  Fall       Balance Screen   Has the patient fallen in the past 6 months  Yes    How many times?  3    Has the patient had a decrease in activity level because of a fear of falling?   Yes    Is the patient reluctant to leave their home because of a fear of falling?   Yes      Home Environment   Additional Comments  uses 4WW around house, about 16 steps in the house, lives with husband, he cares for her and does most of the ADL's.      Prior Function   Level of Independence  Independent with household mobility with device    Vocation  Retired    Leisure  likes to go to [employer]   Overall Strength Comments  Elbows, shoulders, hips, knees and ankles are 3+/5, she reports "I am just so weak"  I just don't do much"      Transfers   Comments  uses a FWW, has to use hands and needs close CGA for safety      Ambulation/Gait   Gait Comments  uses a FWW, slow, very short shuffling steps, close supervision, without the FWW she does much worse with the shuffling steps, when transferring she tends to "get stuck" and shuffle without much movements      Berg Balance Test   Sit to Stand  Able to stand using hands after several tries    Standing Unsupported  Able to stand 2 minutes with supervision    Sitting with Back Unsupported but Feet Supported on Floor or Stool  Able to sit safely and securely 2 minutes    Stand to Sit  Uses backs of legs against chair to control descent    Transfers  Able to transfer with verbal cueing and /or supervision    Standing Unsupported with Eyes Closed  Able to stand 10 seconds with supervision    Standing Unsupported with Feet Together  Needs help to attain position but able to stand for 30 seconds with feet together    From Standing, Reach Forward with Outstretched Arm  Can reach forward >12 cm safely (5")    From Standing Position, Pick up Object from Floor  Unable to pick up and needs supervision    From Standing Position, Turn to Look Behind Over each  Shoulder  Turn sideways only but maintains balance    Turn 360 Degrees  Needs close supervision or verbal cueing    Standing Unsupported, Alternately Place Feet on Step/Stool  Able to complete >2 steps/needs minimal assist    Standing Unsupported, One Foot in [employer] balance while stepping or standing  Standing on One Leg  Tries to lift leg/unable to hold 3 seconds but remains standing independently    Total Score  26      Timed Up and Go Test   Normal TUG (seconds)  46                Objective measurements completed on examination: See above findings.      [facility] Adult PT Treatment/Exercise - 12/12/18 0001      High Level Balance   High Level Balance Activities  Side stepping;Backward walking      Lumbar Exercises: Aerobic   Tread Mill  34mh 2 minutes, a lot of cues to pick up the left foot    Nustep  level 4 x 5 minutes               PT Short Term Goals - 08/25/18 1153      PT SHORT TERM GOAL #1   Title  independent with her home HEP    Status  Achieved        PT Long Term Goals - 12/12/18 1352      PT LONG TERM GOAL #1   Title  decrease TUG time to 22 seconds for functional gait and safety    Time  8    Period  Weeks    Status  Partially Met      PT LONG TERM GOAL #2   Title  increase Berg balance score to 40/56 for safety    Time  8    Period  Weeks      PT LONG TERM GOAL #3   Title  increase LE strength (particularly ABD and ext.) to 4/5 for functional gait and safety    Time  8    Period  Weeks    Status  New      PT LONG TERM GOAL #4   Title  be able to do a sit to stand without assistance and without losing balance for functional use    Time  8    Period  Weeks    Status  New             Plan - 12/12/18 1348    Clinical Impression Statement  Patient has been seen here in the past due to falls and difficulty walking.  She was improving with her balance and her walking when we stopped seeing her on 10/13/18 due to the  Covid-19 issues, her husband reports that he has not been able to get her to exercise much as he reports he is so busy with all of the other tasks.  She reports that she is not motivated.  She decreased in the [place] but is still improved overall from the evaluation in January.  She remains very weak in the LE's especially.  She tends to shuffle especially the left leg, can correct with cues but will revert back to shuffling when she is doing other tasks usually talking.    Stability/Clinical Decision Making  Stable/Uncomplicated    Clinical Decision Making  Moderate    Rehab Potential  Good    PT Frequency  2x / week    PT Duration  8 weeks    PT Treatment/Interventions  ADLs/Self Care Home Management;Electrical Stimulation;Moist Heat;Gait training;Stair training;Therapeutic activities;Functional mobility training;Therapeutic exercise;Patient/family education;Neuromuscular re-education;Balance training    PT Next Visit Plan  resume balance and strength activities to help her function safely    Consulted and  Agree with Plan of Care  Patient       Patient will benefit from skilled therapeutic intervention in order to improve the following deficits and impairments:  Abnormal gait, Pain, Postural dysfunction, Increased muscle spasms, Cardiopulmonary status limiting activity, Decreased activity tolerance, Decreased strength, Difficulty walking, Decreased balance  Visit Diagnosis: Difficulty in walking, not elsewhere classified - Plan: PT plan of care cert/re-cert  Repeated falls - Plan: PT plan of care cert/re-cert  Balance problem - Plan: PT plan of care cert/re-cert  Muscle weakness (generalized) - Plan: PT plan of care cert/re-cert  Parkinson disease ([place]) - Plan: PT plan of care cert/re-cert  Left hemiparesis ([place]) - Plan: PT plan of care cert/re-cert     Problem List Patient Active Problem List   Diagnosis Date Noted  . Familial hypercholesteremia 02/21/2018   . Psychogenic gait 10/21/2017  . Coronary artery disease involving native coronary artery of native heart without angina pectoris 06/09/2017  . Abnormal nuclear cardiac imaging test 05/25/2017  . PVC's (premature ventricular contractions) 05/17/2017  . Anxiety 12/29/2016  . Vascular parkinsonism ([place]) 06/29/2016  . Gait disorder 04/07/2016  . Intracranial carotid stenosis, bilateral 02/27/2016  . Essential hypertension   . Left-sided weakness 12/28/2015  . Headache 12/28/2015  . Adjustment reaction with anxiety 12/20/2015  . Left hemiparesis ([place])   . History of CVA with residual deficit   . Prediabetes   . SVT (supraventricular tachycardia) ([place]) 12/14/2015  . Stroke-like symptom 12/13/2015  . History of stroke   . Heart palpitations 09/06/2012  . Hypertension   . Familial hyperlipidemia   . Hypothyroidism   . Vitamin D deficiency   . Palpitations   . History of dizziness   . LVH (left ventricular hypertrophy)   . Aortic stenosis   . Mitral regurgitation   . SOB (shortness of breath)   . Difficulty walking     Sumner Boast., PT 12/12/2018, 1:55 PM  [place] [address] [place], [place], [address] Phone: [phone]   Fax:  [phone]  Name: Stacey Ramirez MRN: [id number] Date of Birth: 08/06/1940

## 2018-12-14 ENCOUNTER — Other Ambulatory Visit: Payer: Self-pay

## 2018-12-14 ENCOUNTER — Ambulatory Visit (INDEPENDENT_AMBULATORY_CARE_PROVIDER_SITE_OTHER): Payer: PPO | Admitting: *Deleted

## 2018-12-14 DIAGNOSIS — I639 Cerebral infarction, unspecified: Secondary | ICD-10-CM

## 2018-12-15 ENCOUNTER — Other Ambulatory Visit: Payer: Self-pay

## 2018-12-15 ENCOUNTER — Ambulatory Visit: Payer: PPO | Admitting: Physical Therapy

## 2018-12-15 ENCOUNTER — Encounter: Payer: Self-pay | Admitting: Physical Therapy

## 2018-12-15 DIAGNOSIS — G20 Parkinson's disease: Secondary | ICD-10-CM

## 2018-12-15 DIAGNOSIS — R2689 Other abnormalities of gait and mobility: Secondary | ICD-10-CM

## 2018-12-15 DIAGNOSIS — R262 Difficulty in walking, not elsewhere classified: Secondary | ICD-10-CM

## 2018-12-15 DIAGNOSIS — G20A1 Parkinson's disease without dyskinesia, without mention of fluctuations: Secondary | ICD-10-CM

## 2018-12-15 DIAGNOSIS — M6281 Muscle weakness (generalized): Secondary | ICD-10-CM

## 2018-12-15 DIAGNOSIS — G8194 Hemiplegia, unspecified affecting left nondominant side: Secondary | ICD-10-CM

## 2018-12-15 DIAGNOSIS — R296 Repeated falls: Secondary | ICD-10-CM

## 2018-12-15 LAB — CUP PACEART REMOTE DEVICE CHECK
Date Time Interrogation Session: 20200513181234
Implantable Pulse Generator Implant Date: 20170516

## 2018-12-15 NOTE — Therapy (Signed)
[place], [place], [address] Phone: [phone]   Fax:  [phone]  Physical Therapy Treatment  Patient Details  Name: Stacey Ramirez MRN: [id number] Date of Birth: 10/22/1940 Referring Provider (PT): Pharr   Encounter Date: 12/15/2018  PT End of Session - 12/15/18 1400    Visit Number  2    Date for PT Re-Evaluation  02/11/19    PT Start Time  1315    PT Stop Time  1400    PT Time Calculation (min)  45 min    Activity Tolerance  Patient tolerated treatment well    Behavior During Therapy  [facility] for tasks assessed/performed       Past Medical History:  Diagnosis Date  . Adjustment reaction with anxiety 12/20/2015  . Aortic stenosis   . CAD (coronary artery disease) 06/09/2017   Nuc study 10/18: EF 69, inferolateral perfusion defect-possible small infarct with peri-infarct ischemia; intermediate risk // LHC 10/18: pLAD 25, pLCx 50, pRCA 25 >> med Rx  . Carotid stenosis, asymptomatic, bilateral 02/27/2016   Carotid US 5/17: Bilateral ICA 1-39  . Cerebrovascular accident (stroke) ([place])    a. 12/2015 - cryptogenic.  S/P MDT Linq.  . Difficulty walking   . Essential hypertension   . Gait disorder 04/07/2016  . Heart palpitations 09/06/2012  . History of CVA with residual deficit 12/14/2015  . History of dizziness   . History of echocardiogram    Echo 10/18: Vigorous LVF, EF 65-70, normal wall motion, grade 1 diastolic dysfunction, GLS -21.1%, mild RAE  . History of nuclear stress test    Nuclear stress test 10/18: EF 69, inf-lat defect c/w poss infarct with peri-infarct ischemia; Intermediate Risk  . Hyperlipidemia   . Hypothyroidism   . Left hemiparesis ([place])   . LVH (left ventricular hypertrophy)   . Mitral regurgitation    a. 12/2015 Echo: EF 60-65%, mild focal basal hypertrophy. No rwma, triv AI, mild MR, mildly dil LA w/o thrombus. No RA thrombus. No PFO.  Marland Kitchen Palpitations   . Prediabetes   .  Stroke-like symptom 12/13/2015  . SVT (supraventricular tachycardia) ([place]) 12/14/2015  . Vascular parkinsonism ([place]) 06/29/2016  . Vitamin D deficiency     Past Surgical History:  Procedure Laterality Date  . CARDIOVASCULAR STRESS TEST  2002   NORMAL  . EP IMPLANTABLE DEVICE N/A 12/17/2015   Procedure: Loop Recorder Insertion;  Surgeon: [name], MD;  Location: [place] [facility];  Service: Cardiovascular;  Laterality: N/A;  . LEFT HEART CATH AND CORONARY ANGIOGRAPHY N/A 05/25/2017   Procedure: LEFT HEART CATH AND CORONARY ANGIOGRAPHY;  Surgeon: Sherren Mocha, MD;  Location: [place];  Service: Cardiovascular;  Laterality: N/A;  . LOOP RECORDER IMPLANT    . TEE WITHOUT CARDIOVERSION N/A 12/17/2015   Procedure: TRANSESOPHAGEAL ECHOCARDIOGRAM (TEE);  Surgeon: Lelon Perla, MD;  Location: [facility] ENDOSCOPY;  Service: Cardiovascular;  Laterality: N/A;  Pt also needs a LOOP  . TRANSTHORACIC ECHOCARDIOGRAM  2008   SHOWED LEFT VENTRICULAR HYPERTROPHY AND MILD AORTIC STENOSIS    There were no vitals filed for this visit.  Subjective Assessment - 12/15/18 1318    Subjective  Patient reports that she was tired and out of breath after the last treatment    Currently in Pain?  No/denies                       [facility] Adult PT Treatment/Exercise -  12/15/18 0001      High Level Balance   High Level Balance Comments  standing ball toss, standing with SPC 6: toe touches      Lumbar Exercises: Aerobic   Tread Mill  66mh 3 minutes, a lot of cues to pick up the left foot    Nustep  level 4 x 7 minutes      Lumbar Exercises: Machines for Strengthening   Cybex Knee Extension  5# 2x10    Cybex Knee Flexion  20# 2x10    Leg Press  20# 2x10               PT Short Term Goals - 08/25/18 1153      PT SHORT TERM GOAL #1   Title  independent with her home HEP    Status  Achieved        PT Long Term Goals - 12/12/18 1352      PT LONG TERM GOAL #1   Title   decrease TUG time to 22 seconds for functional gait and safety    Time  8    Period  Weeks    Status  Partially Met      PT LONG TERM GOAL #2   Title  increase Berg balance score to 40/56 for safety    Time  8    Period  Weeks      PT LONG TERM GOAL #3   Title  increase LE strength (particularly ABD and ext.) to 4/5 for functional gait and safety    Time  8    Period  Weeks    Status  New      PT LONG TERM GOAL #4   Title  be able to do a sit to stand without assistance and without losing balance for functional use    Time  8    Period  Weeks    Status  New            Plan - 12/15/18 1401    Clinical Impression Statement  Patient tolerated the activities well, she did report her legs shaking some after the exercises, she continues to have issues with transitional movements with some festinating gait patterns.  Cues of big steps seems to help some or if I kind of push her or pull her along she will go, but on her own she does get stuck    PT Next Visit Plan  resume balance and strength activities to help her function safely    Consulted and Agree with Plan of Care  Patient       Patient will benefit from skilled therapeutic intervention in order to improve the following deficits and impairments:  Abnormal gait, Pain, Postural dysfunction, Increased muscle spasms, Cardiopulmonary status limiting activity, Decreased activity tolerance, Decreased strength, Difficulty walking, Decreased balance  Visit Diagnosis: Repeated falls  Difficulty in walking, not elsewhere classified  Balance problem  Muscle weakness (generalized)  Parkinson disease ([facility])  [place]ip[place]is ([place]     Problem List Patient Active Problem List   Diagnosis Date Noted  . Familial hypercholesteremia 02/21/2018  . Psychogenic gait 10/21/2017  . Coronary artery disease involving native coronary artery of native heart without angina pectoris 06/09/2017  . Abnormal nuclear cardiac imaging test  05/25/2017  . PVC's (premature ventricular contractions) 05/17/2017  . Anxiety 12/29/2016  . Vascular parkinsonism ([place] 06/29/2016  . Gait disorder 04/07/2016  . Intracranial carotid stenosis, bilateral 02/27/2016  . Essential hypertension   . Left-sided  weakness 12/28/2015  . Headache 12/28/2015  . Adjustment reaction with anxiety 12/20/2015  . Left hemiparesis ([place])   . History of CVA with residual deficit   . Prediabetes   . SVT (supraventricular tachycardia) ([place]) 12/14/2015  . Stroke-like symptom 12/13/2015  . History of stroke   . Heart palpitations 09/06/2012  . Hypertension   . Familial hyperlipidemia   . Hypothyroidism   . Vitamin D deficiency   . Palpitations   . History of dizziness   . LVH (left ventricular hypertrophy)   . Aortic stenosis   . Mitral regurgitation   . SOB (shortness of breath)   . Difficulty walking     Sumner Boast., PT 12/15/2018, 2:03 PM  [place] [address] [place], [place], [address] Phone: [phone]   Fax:  [phone]  Name: Stacey Ramirez MRN: [id number] Date of Birth: 08-01-1941

## 2018-12-19 ENCOUNTER — Other Ambulatory Visit: Payer: Self-pay

## 2018-12-19 ENCOUNTER — Encounter: Payer: Self-pay | Admitting: Physical Therapy

## 2018-12-19 ENCOUNTER — Ambulatory Visit: Payer: PPO | Admitting: Physical Therapy

## 2018-12-19 DIAGNOSIS — R262 Difficulty in walking, not elsewhere classified: Secondary | ICD-10-CM

## 2018-12-19 DIAGNOSIS — R2689 Other abnormalities of gait and mobility: Secondary | ICD-10-CM

## 2018-12-19 DIAGNOSIS — R296 Repeated falls: Secondary | ICD-10-CM

## 2018-12-19 NOTE — Therapy (Signed)
[place], [place], [address] Phone: [phone]   Fax:  [phone]  Physical Therapy Treatment  Patient Details  Name: Stacey Ramirez MRN: [id number] Date of Birth: 21-Apr-1941 Referring Provider (PT): Pharr   Encounter Date: 12/19/2018  PT End of Session - 12/19/18 1343    Visit Number  3    Date for PT Re-Evaluation  02/11/19    PT Start Time  1300    PT Stop Time  1343    PT Time Calculation (min)  43 min    Activity Tolerance  Patient tolerated treatment well    Behavior During Therapy  [facility] for tasks assessed/performed       Past Medical History:  Diagnosis Date  . Adjustment reaction with anxiety 12/20/2015  . Aortic stenosis   . CAD (coronary artery disease) 06/09/2017   Nuc study 10/18: EF 69, inferolateral perfusion defect-possible small infarct with peri-infarct ischemia; intermediate risk // LHC 10/18: pLAD 25, pLCx 50, pRCA 25 >> med Rx  . Carotid stenosis, asymptomatic, bilateral 02/27/2016   Carotid US 5/17: Bilateral ICA 1-39  . Cerebrovascular accident (stroke) ([place])    a. 12/2015 - cryptogenic.  S/P MDT Linq.  . Difficulty walking   . Essential hypertension   . Gait disorder 04/07/2016  . Heart palpitations 09/06/2012  . History of CVA with residual deficit 12/14/2015  . History of dizziness   . History of echocardiogram    Echo 10/18: Vigorous LVF, EF 65-70, normal wall motion, grade 1 diastolic dysfunction, GLS -21.1%, mild RAE  . History of nuclear stress test    Nuclear stress test 10/18: EF 69, inf-lat defect c/w poss infarct with peri-infarct ischemia; Intermediate Risk  . Hyperlipidemia   . Hypothyroidism   . Left hemiparesis ([place])   . LVH (left ventricular hypertrophy)   . Mitral regurgitation    a. 12/2015 Echo: EF 60-65%, mild focal basal hypertrophy. No rwma, triv AI, mild MR, mildly dil LA w/o thrombus. No RA thrombus. No PFO.  Marland Kitchen Palpitations   . Prediabetes   .  Stroke-like symptom 12/13/2015  . SVT (supraventricular tachycardia) ([place]) 12/14/2015  . Vascular parkinsonism ([place]) 06/29/2016  . Vitamin D deficiency     Past Surgical History:  Procedure Laterality Date  . CARDIOVASCULAR STRESS TEST  2002   NORMAL  . EP IMPLANTABLE DEVICE N/A 12/17/2015   Procedure: Loop Recorder Insertion;  Surgeon: [name], MD;  Location: [place] CV LAB;  Service: Cardiovascular;  Laterality: N/A;  . LEFT HEART CATH AND CORONARY ANGIOGRAPHY N/A 05/25/2017   Procedure: LEFT HEART CATH AND CORONARY ANGIOGRAPHY;  Surgeon: Sherren Mocha, MD;  Location: [place] CV LAB;  Service: Cardiovascular;  Laterality: N/A;  . LOOP RECORDER IMPLANT    . TEE WITHOUT CARDIOVERSION N/A 12/17/2015   Procedure: TRANSESOPHAGEAL ECHOCARDIOGRAM (TEE);  Surgeon: Lelon Perla, MD;  Location: [facility] ENDOSCOPY;  Service: Cardiovascular;  Laterality: N/A;  Pt also needs a LOOP  . TRANSTHORACIC ECHOCARDIOGRAM  2008   SHOWED LEFT VENTRICULAR HYPERTROPHY AND MILD AORTIC STENOSIS    There were no vitals filed for this visit.  Subjective Assessment - 12/19/18 1305    Subjective  "Well I am here"    Currently in Pain?  No/denies                       [facility] Adult PT Treatment/Exercise - 12/19/18 0001      Lumbar Exercises:  Aerobic   Tread Mill  47mh 3 minutes, a lot of cues to pick up the left foot    Nustep  level 4 x 7 minutes      Lumbar Exercises: Machines for Strengthening   Cybex Knee Extension  5# 2x10    Cybex Knee Flexion  20# 2x10    Leg Press  20# 2x10      Knee/Hip Exercises: Standing   Other Standing Knee Exercises  LLE toe taps HHA x1 1.5lb  2x10       Knee/Hip Exercises: Seated   Sit to Sand  2 sets   3 reps, LE pushing off knees              PT Short Term Goals - 08/25/18 1153      PT SHORT TERM GOAL #1   Title  independent with her home HEP    Status  Achieved        PT Long Term Goals - 12/12/18 1352      PT LONG TERM  GOAL #1   Title  decrease TUG time to 22 seconds for functional gait and safety    Time  8    Period  Weeks    Status  Partially Met      PT LONG TERM GOAL #2   Title  increase Berg balance score to 40/56 for safety    Time  8    Period  Weeks      PT LONG TERM GOAL #3   Title  increase LE strength (particularly ABD and ext.) to 4/5 for functional gait and safety    Time  8    Period  Weeks    Status  New      PT LONG TERM GOAL #4   Title  be able to do a sit to stand without assistance and without losing balance for functional use    Time  8    Period  Weeks    Status  New            Plan - 12/19/18 1344    Clinical Impression Statement  Cues needed on Tmill to take bigger steps with LLE. She becomes very fatigue with exercise interventions. She did ambulate around clinic without AD and assist, but are very hesitant to walk alone. Pt seem to have some trouble picking up LLE. with gait.     Stability/Clinical Decision Making  Stable/Uncomplicated    Rehab Potential  Good    PT Frequency  2x / week    PT Treatment/Interventions  ADLs/Self Care Home Management;Electrical Stimulation;Moist Heat;Gait training;Stair training;Therapeutic activities;Functional mobility training;Therapeutic exercise;Patient/family education;Neuromuscular re-education;Balance training    PT Next Visit Plan  resume balance and strength activities to help her function safely       Patient will benefit from skilled therapeutic intervention in order to improve the following deficits and impairments:  Abnormal gait, Pain, Postural dysfunction, Increased muscle spasms, Cardiopulmonary status limiting activity, Decreased activity tolerance, Decreased strength, Difficulty walking, Decreased balance  Visit Diagnosis: Difficulty in walking, not elsewhere classified  Repeated falls  Balance problem     Problem List Patient Active Problem List   Diagnosis Date Noted  . Familial hypercholesteremia  02/21/2018  . Psychogenic gait 10/21/2017  . Coronary artery disease involving native coronary artery of native heart without angina pectoris 06/09/2017  . Abnormal nuclear cardiac imaging test 05/25/2017  . PVC's (premature ventricular contractions) 05/17/2017  . Anxiety 12/29/2016  . Vascular parkinsonism ([place]  06/29/2016  . Gait disorder 04/07/2016  . Intracranial carotid stenosis, bilateral 02/27/2016  . Essential hypertension   . Left-sided weakness 12/28/2015  . Headache 12/28/2015  . Adjustment reaction with anxiety 12/20/2015  . Left hemiparesis ([place])   . History of CVA with residual deficit   . Prediabetes   . SVT (supraventricular tachycardia) ([place]) 12/14/2015  . Stroke-like symptom 12/13/2015  . History of stroke   . Heart palpitations 09/06/2012  . Hypertension   . Familial hyperlipidemia   . Hypothyroidism   . Vitamin D deficiency   . Palpitations   . History of dizziness   . LVH (left ventricular hypertrophy)   . Aortic stenosis   . Mitral regurgitation   . SOB (shortness of breath)   . Difficulty walking     Scot Jun 12/19/2018, 1:45 PM  [place] [address] [place], [place], [address] Phone: [phone]   Fax:  [phone]  Name: Stacey Ramirez MRN: [id number] Date of Birth: [birth year]

## 2018-12-20 DIAGNOSIS — M549 Dorsalgia, unspecified: Secondary | ICD-10-CM | POA: Diagnosis not present

## 2018-12-20 DIAGNOSIS — M25512 Pain in left shoulder: Secondary | ICD-10-CM | POA: Diagnosis not present

## 2018-12-20 DIAGNOSIS — M1990 Unspecified osteoarthritis, unspecified site: Secondary | ICD-10-CM | POA: Diagnosis not present

## 2018-12-20 DIAGNOSIS — M2550 Pain in unspecified joint: Secondary | ICD-10-CM | POA: Diagnosis not present

## 2018-12-20 DIAGNOSIS — M353 Polymyalgia rheumatica: Secondary | ICD-10-CM | POA: Diagnosis not present

## 2018-12-20 DIAGNOSIS — M25559 Pain in unspecified hip: Secondary | ICD-10-CM | POA: Diagnosis not present

## 2018-12-22 ENCOUNTER — Encounter: Payer: Self-pay | Admitting: Physical Therapy

## 2018-12-22 ENCOUNTER — Ambulatory Visit: Payer: PPO | Admitting: Physical Therapy

## 2018-12-22 ENCOUNTER — Other Ambulatory Visit: Payer: Self-pay

## 2018-12-22 DIAGNOSIS — R2689 Other abnormalities of gait and mobility: Secondary | ICD-10-CM

## 2018-12-22 DIAGNOSIS — M6281 Muscle weakness (generalized): Secondary | ICD-10-CM

## 2018-12-22 DIAGNOSIS — R262 Difficulty in walking, not elsewhere classified: Secondary | ICD-10-CM | POA: Diagnosis not present

## 2018-12-22 DIAGNOSIS — R296 Repeated falls: Secondary | ICD-10-CM

## 2018-12-22 NOTE — Therapy (Signed)
[place], [place], [address] Phone: [phone]   Fax:  [phone]  Physical Therapy Treatment  Patient Details  Name: Stacey Ramirez MRN: [id number] Date of Birth: January 11, 1941 Referring Provider (PT): Pharr   Encounter Date: 12/22/2018  PT End of Session - 12/22/18 1346    Visit Number  4    Number of Visits  16    Date for PT Re-Evaluation  02/11/19    PT Start Time  1300    PT Stop Time  1346    PT Time Calculation (min)  46 min    Activity Tolerance  Patient tolerated treatment well    Behavior During Therapy  [facility] for tasks assessed/performed       Past Medical History:  Diagnosis Date  . Adjustment reaction with anxiety 12/20/2015  . Aortic stenosis   . CAD (coronary artery disease) 06/09/2017   Nuc study 10/18: EF 69, inferolateral perfusion defect-possible small infarct with peri-infarct ischemia; intermediate risk // LHC 10/18: pLAD 25, pLCx 50, pRCA 25 >> med Rx  . Carotid stenosis, asymptomatic, bilateral 02/27/2016   Carotid US 5/17: Bilateral ICA 1-39  . Cerebrovascular accident (stroke) ([place])    a. 12/2015 - cryptogenic.  S/P MDT Linq.  . Difficulty walking   . Essential hypertension   . Gait disorder 04/07/2016  . Heart palpitations 09/06/2012  . History of CVA with residual deficit 12/14/2015  . History of dizziness   . History of echocardiogram    Echo 10/18: Vigorous LVF, EF 65-70, normal wall motion, grade 1 diastolic dysfunction, GLS -21.1%, mild RAE  . History of nuclear stress test    Nuclear stress test 10/18: EF 69, inf-lat defect c/w poss infarct with peri-infarct ischemia; Intermediate Risk  . Hyperlipidemia   . Hypothyroidism   . Left hemiparesis ([place])   . LVH (left ventricular hypertrophy)   . Mitral regurgitation    a. 12/2015 Echo: EF 60-65%, mild focal basal hypertrophy. No rwma, triv AI, mild MR, mildly dil LA w/o thrombus. No RA thrombus. No PFO.  Marland Kitchen  Palpitations   . Prediabetes   . Stroke-like symptom 12/13/2015  . SVT (supraventricular tachycardia) ([place]) 12/14/2015  . Vascular parkinsonism ([place]) 06/29/2016  . Vitamin D deficiency     Past Surgical History:  Procedure Laterality Date  . CARDIOVASCULAR STRESS TEST  2002   NORMAL  . EP IMPLANTABLE DEVICE N/A 12/17/2015   Procedure: Loop Recorder Insertion;  Surgeon: [name], MD;  Location: [place] CV LAB;  Service: Cardiovascular;  Laterality: N/A;  . LEFT HEART CATH AND CORONARY ANGIOGRAPHY N/A 05/25/2017   Procedure: LEFT HEART CATH AND CORONARY ANGIOGRAPHY;  Surgeon: Sherren Mocha, MD;  Location: [place] CV LAB;  Service: Cardiovascular;  Laterality: N/A;  . LOOP RECORDER IMPLANT    . TEE WITHOUT CARDIOVERSION N/A 12/17/2015   Procedure: TRANSESOPHAGEAL ECHOCARDIOGRAM (TEE);  Surgeon: Lelon Perla, MD;  Location: [facility] ENDOSCOPY;  Service: Cardiovascular;  Laterality: N/A;  Pt also needs a LOOP  . TRANSTHORACIC ECHOCARDIOGRAM  2008   SHOWED LEFT VENTRICULAR HYPERTROPHY AND MILD AORTIC STENOSIS    There were no vitals filed for this visit.  Subjective Assessment - 12/22/18 1303    Subjective  "Pretty draggy"    Currently in Pain?  No/denies                       [facility] Adult PT Treatment/Exercise - 12/22/18 0001  High Level Balance   High Level Balance Activities  Side stepping   HA x2   High Level Balance Comments   standing with SPC 6: toe touches      Lumbar Exercises: Aerobic   Tread Mill  2mh 3 minutes, a lot of cues to pick up the left foot    Nustep  level 4 x 7 minutes      Lumbar Exercises: Machines for Strengthening   Cybex Knee Extension  5# 2x15    Cybex Knee Flexion  25# 2x10      Knee/Hip Exercises: Standing   Other Standing Knee Exercises  2lb standing march 2x10 with RW       Shoulder Exercises: Standing   Row  Theraband;20 reps;Both;Strengthening    Theraband Level (Shoulder Row)  Level 2 (Red)                PT Short Term Goals - 08/25/18 1153      PT SHORT TERM GOAL #1   Title  independent with her home HEP    Status  Achieved        PT Long Term Goals - 12/12/18 1352      PT LONG TERM GOAL #1   Title  decrease TUG time to 22 seconds for functional gait and safety    Time  8    Period  Weeks    Status  Partially Met      PT LONG TERM GOAL #2   Title  increase Berg balance score to 40/56 for safety    Time  8    Period  Weeks      PT LONG TERM GOAL #3   Title  increase LE strength (particularly ABD and ext.) to 4/5 for functional gait and safety    Time  8    Period  Weeks    Status  New      PT LONG TERM GOAL #4   Title  be able to do a sit to stand without assistance and without losing balance for functional use    Time  8    Period  Weeks    Status  New            Plan - 12/22/18 1350    Clinical Impression Statement  Pt did well overall, again ambulated around clinic without AD, she does requires some time after standing to take a step. Some balance and instability with alt step ups. Increase weight tolerated on leg curls and extensions.     Stability/Clinical Decision Making  Stable/Uncomplicated    Rehab Potential  Good    PT Frequency  2x / week    PT Treatment/Interventions  ADLs/Self Care Home Management;Electrical Stimulation;Moist Heat;Gait training;Stair training;Therapeutic activities;Functional mobility training;Therapeutic exercise;Patient/family education;Neuromuscular re-education;Balance training    PT Next Visit Plan  resume balance and strength activities to help her function safely       Patient will benefit from skilled therapeutic intervention in order to improve the following deficits and impairments:  Abnormal gait, Pain, Postural dysfunction, Increased muscle spasms, Cardiopulmonary status limiting activity, Decreased activity tolerance, Decreased strength, Difficulty walking, Decreased balance  Visit  Diagnosis: Difficulty in walking, not elsewhere classified  Repeated falls  Balance problem  Muscle weakness (generalized)     Problem List Patient Active Problem List   Diagnosis Date Noted  . Familial hypercholesteremia 02/21/2018  . Psychogenic gait 10/21/2017  . Coronary artery disease involving native coronary artery of native heart without angina  pectoris 06/09/2017  . Abnormal nuclear cardiac imaging test 05/25/2017  . PVC's (premature ventricular contractions) 05/17/2017  . Anxiety 12/29/2016  . Vascular parkinsonism ([place]) 06/29/2016  . Gait disorder 04/07/2016  . Intracranial carotid stenosis, bilateral 02/27/2016  . Essential hypertension   . Left-sided weakness 12/28/2015  . Headache 12/28/2015  . Adjustment reaction with anxiety 12/20/2015  . Left hemiparesis ([place])   . History of CVA with residual deficit   . Prediabetes   . SVT (supraventricular tachycardia) ([place]) 12/14/2015  . Stroke-like symptom 12/13/2015  . History of stroke   . Heart palpitations 09/06/2012  . Hypertension   . Familial hyperlipidemia   . Hypothyroidism   . Vitamin D deficiency   . Palpitations   . History of dizziness   . LVH (left ventricular hypertrophy)   . Aortic stenosis   . Mitral regurgitation   . SOB (shortness of breath)   . Difficulty walking     Scot Jun, PTA 12/22/2018, 1:56 PM  [place], [place], [address] Phone: [phone]   Fax:  [phone]  Name: Stacey Ramirez MRN: [id number] Date of Birth: March 12, 1941

## 2018-12-27 ENCOUNTER — Other Ambulatory Visit: Payer: Self-pay

## 2018-12-27 ENCOUNTER — Encounter: Payer: Self-pay | Admitting: Physical Therapy

## 2018-12-27 ENCOUNTER — Ambulatory Visit: Payer: PPO | Admitting: Physical Therapy

## 2018-12-27 DIAGNOSIS — R262 Difficulty in walking, not elsewhere classified: Secondary | ICD-10-CM

## 2018-12-27 DIAGNOSIS — R296 Repeated falls: Secondary | ICD-10-CM

## 2018-12-27 DIAGNOSIS — M6281 Muscle weakness (generalized): Secondary | ICD-10-CM

## 2018-12-27 DIAGNOSIS — R2689 Other abnormalities of gait and mobility: Secondary | ICD-10-CM

## 2018-12-27 NOTE — Therapy (Signed)
[place], [place], [address] Phone: [phone]   Fax:  [phone]  Physical Therapy Treatment  Patient Details  Name: Stacey Ramirez MRN: [id number] Date of Birth: 1940/11/25 Referring Provider (PT): Shelia Media   Encounter Date: 12/27/2018    Past Medical History:  Diagnosis Date  . Adjustment reaction with anxiety 12/20/2015  . Aortic stenosis   . CAD (coronary artery disease) 06/09/2017   Nuc study 10/18: EF 69, inferolateral perfusion defect-possible small infarct with peri-infarct ischemia; intermediate risk // LHC 10/18: pLAD 25, pLCx 50, pRCA 25 >> med Rx  . Carotid stenosis, asymptomatic, bilateral 02/27/2016   Carotid US 5/17: Bilateral ICA 1-39  . Cerebrovascular accident (stroke) ([place])    a. 12/2015 - cryptogenic.  S/P MDT Linq.  . Difficulty walking   . Essential hypertension   . Gait disorder 04/07/2016  . Heart palpitations 09/06/2012  . History of CVA with residual deficit 12/14/2015  . History of dizziness   . History of echocardiogram    Echo 10/18: Vigorous LVF, EF 65-70, normal wall motion, grade 1 diastolic dysfunction, GLS -21.1%, mild RAE  . History of nuclear stress test    Nuclear stress test 10/18: EF 69, inf-lat defect c/w poss infarct with peri-infarct ischemia; Intermediate Risk  . Hyperlipidemia   . Hypothyroidism   . Left hemiparesis ([place])   . LVH (left ventricular hypertrophy)   . Mitral regurgitation    a. 12/2015 Echo: EF 60-65%, mild focal basal hypertrophy. No rwma, triv AI, mild MR, mildly dil LA w/o thrombus. No RA thrombus. No PFO.  Marland Kitchen Palpitations   . Prediabetes   . Stroke-like symptom 12/13/2015  . SVT (supraventricular tachycardia) ([place]) 12/14/2015  . Vascular parkinsonism ([place]) 06/29/2016  . Vitamin D deficiency     Past Surgical History:  Procedure Laterality Date  . CARDIOVASCULAR STRESS TEST  2002   NORMAL  . EP IMPLANTABLE DEVICE N/A 12/17/2015   Procedure: Loop Recorder Insertion;  Surgeon: [name], MD;  Location: [place] CV LAB;  Service: Cardiovascular;  Laterality: N/A;  . LEFT HEART CATH AND CORONARY ANGIOGRAPHY N/A 05/25/2017   Procedure: LEFT HEART CATH AND CORONARY ANGIOGRAPHY;  Surgeon: Sherren Mocha, MD;  Location: [place] CV LAB;  Service: Cardiovascular;  Laterality: N/A;  . LOOP RECORDER IMPLANT    . TEE WITHOUT CARDIOVERSION N/A 12/17/2015   Procedure: TRANSESOPHAGEAL ECHOCARDIOGRAM (TEE);  Surgeon: Lelon Perla, MD;  Location: [facility] ENDOSCOPY;  Service: Cardiovascular;  Laterality: N/A;  Pt also needs a LOOP  . TRANSTHORACIC ECHOCARDIOGRAM  2008   SHOWED LEFT VENTRICULAR HYPERTROPHY AND MILD AORTIC STENOSIS    There were no vitals filed for this visit.  Subjective Assessment - 12/27/18 1306    Subjective  I am a little tired today, but I didn't not want to come cause I need to keep moving"    Currently in Pain?  No/denies                       [facility] Adult PT Treatment/Exercise - 12/27/18 0001      Ambulation/Gait   Gait Comments  ambulated through office no AD to lobby      High Level Balance   High Level Balance Activities  Side stepping;Negotiating over obstacles    High Level Balance Comments   standing with SPC 6: toe touches      Lumbar Exercises: Aerobic   Tread Mill  90mh 3  minutes, a lot of cues to pick up the left foot    Nustep  level 4 x 7 minutes      Lumbar Exercises: Standing   Row  Theraband;20 reps;Both    Theraband Level (Row)  Level 2 (Red)    Shoulder Extension  20 reps;Theraband;Both;Strengthening    Theraband Level (Shoulder Extension)  Level 2 (Red)      Knee/Hip Exercises: Standing   Other Standing Knee Exercises  Standing march HHA x2 2x10                PT Short Term Goals - 08/25/18 1153      PT SHORT TERM GOAL #1   Title  independent with her home HEP    Status  Achieved        PT Long Term Goals - 12/27/18 1345      PT LONG  TERM GOAL #1   Title  decrease TUG time to 22 seconds for functional gait and safety    Status  Partially Met      PT LONG TERM GOAL #2   Title  increase Berg balance score to 40/56 for safety    Status  Partially Met            Plan - 12/27/18 1345    Clinical Impression Statement  Pt with a improved ability with I ambulation. she again walked around clinic without AD requiring a few seconds after standing to take her first step. Tends to drag the L leg requiring cues to correct.Some instability worth alt box taps. Negotiated over objects with SBA.     Rehab Potential  Good    PT Frequency  2x / week    PT Next Visit Plan  resume balance and strength activities to help her function safely. Check goals       Patient will benefit from skilled therapeutic intervention in order to improve the following deficits and impairments:  Abnormal gait, Pain, Postural dysfunction, Increased muscle spasms, Cardiopulmonary status limiting activity, Decreased activity tolerance, Decreased strength, Difficulty walking, Decreased balance  Visit Diagnosis: Difficulty in walking, not elsewhere classified  Repeated falls  Balance problem  Muscle weakness (generalized)     Problem List Patient Active Problem List   Diagnosis Date Noted  . Familial hypercholesteremia 02/21/2018  . Psychogenic gait 10/21/2017  . Coronary artery disease involving native coronary artery of native heart without angina pectoris 06/09/2017  . Abnormal nuclear cardiac imaging test 05/25/2017  . PVC's (premature ventricular contractions) 05/17/2017  . Anxiety 12/29/2016  . Vascular parkinsonism ([place]) 06/29/2016  . Gait disorder 04/07/2016  . Intracranial carotid stenosis, bilateral 02/27/2016  . Essential hypertension   . Left-sided weakness 12/28/2015  . Headache 12/28/2015  . Adjustment reaction with anxiety 12/20/2015  . Left hemiparesis ([place])   . History of CVA with residual deficit   . Prediabetes   .  SVT (supraventricular tachycardia) ([place]) 12/14/2015  . Stroke-like symptom 12/13/2015  . History of stroke   . Heart palpitations 09/06/2012  . Hypertension   . Familial hyperlipidemia   . Hypothyroidism   . Vitamin D deficiency   . Palpitations   . History of dizziness   . LVH (left ventricular hypertrophy)   . Aortic stenosis   . Mitral regurgitation   . SOB (shortness of breath)   . Difficulty walking     Scot Jun, PTA 12/27/2018, 1:50 PM  [place] [phone]  [place], [place], [address] Phone: [phone]   Fax:  [phone]  Name: Stacey Ramirez MRN: [id number] Date of Birth: 14-Sep-1940

## 2018-12-29 ENCOUNTER — Ambulatory Visit: Payer: PPO | Admitting: Physical Therapy

## 2018-12-29 ENCOUNTER — Other Ambulatory Visit: Payer: Self-pay

## 2018-12-29 ENCOUNTER — Encounter: Payer: Self-pay | Admitting: Physical Therapy

## 2018-12-29 DIAGNOSIS — G8194 Hemiplegia, unspecified affecting left nondominant side: Secondary | ICD-10-CM

## 2018-12-29 DIAGNOSIS — R2689 Other abnormalities of gait and mobility: Secondary | ICD-10-CM

## 2018-12-29 DIAGNOSIS — R262 Difficulty in walking, not elsewhere classified: Secondary | ICD-10-CM

## 2018-12-29 DIAGNOSIS — M6281 Muscle weakness (generalized): Secondary | ICD-10-CM

## 2018-12-29 DIAGNOSIS — R296 Repeated falls: Secondary | ICD-10-CM

## 2018-12-29 NOTE — Therapy (Signed)
[place], [place], [address] Phone: [phone]   Fax:  [phone]  Physical Therapy Treatment  Patient Details  Name: Stacey Ramirez MRN: [id number] Date of Birth: 06-27-1941 Referring Provider (PT): Pharr   Encounter Date: 12/29/2018  PT End of Session - 12/29/18 1345    Number of Visits  16    Date for PT Re-Evaluation  02/11/19    PT Start Time  1300    PT Stop Time  1345    PT Time Calculation (min)  45 min    Activity Tolerance  Patient tolerated treatment well    Behavior During Therapy  [facility] for tasks assessed/performed       Past Medical History:  Diagnosis Date  . Adjustment reaction with anxiety 12/20/2015  . Aortic stenosis   . CAD (coronary artery disease) 06/09/2017   Nuc study 10/18: EF 69, inferolateral perfusion defect-possible small infarct with peri-infarct ischemia; intermediate risk // LHC 10/18: pLAD 25, pLCx 50, pRCA 25 >> med Rx  . Carotid stenosis, asymptomatic, bilateral 02/27/2016   Carotid US 5/17: Bilateral ICA 1-39  . Cerebrovascular accident (stroke) ([place])    a. 12/2015 - cryptogenic.  S/P MDT Linq.  . Difficulty walking   . Essential hypertension   . Gait disorder 04/07/2016  . Heart palpitations 09/06/2012  . History of CVA with residual deficit 12/14/2015  . History of dizziness   . History of echocardiogram    Echo 10/18: Vigorous LVF, EF 65-70, normal wall motion, grade 1 diastolic dysfunction, GLS -21.1%, mild RAE  . History of nuclear stress test    Nuclear stress test 10/18: EF 69, inf-lat defect c/w poss infarct with peri-infarct ischemia; Intermediate Risk  . Hyperlipidemia   . Hypothyroidism   . Left hemiparesis ([place])   . LVH (left ventricular hypertrophy)   . Mitral regurgitation    a. 12/2015 Echo: EF 60-65%, mild focal basal hypertrophy. No rwma, triv AI, mild MR, mildly dil LA w/o thrombus. No RA thrombus. No PFO.  Marland Kitchen Palpitations   . Prediabetes    . Stroke-like symptom 12/13/2015  . SVT (supraventricular tachycardia) ([place]) 12/14/2015  . Vascular parkinsonism ([place]) 06/29/2016  . Vitamin D deficiency     Past Surgical History:  Procedure Laterality Date  . CARDIOVASCULAR STRESS TEST  2002   NORMAL  . EP IMPLANTABLE DEVICE N/A 12/17/2015   Procedure: Loop Recorder Insertion;  Surgeon: [name], MD;  Location: [place] [facility];  Service: Cardiovascular;  Laterality: N/A;  . LEFT HEART CATH AND CORONARY ANGIOGRAPHY N/A 05/25/2017   Procedure: LEFT HEART CATH AND CORONARY ANGIOGRAPHY;  Surgeon: Sherren Mocha, MD;  Location: [place] [facility];  Service: Cardiovascular;  Laterality: N/A;  . LOOP RECORDER IMPLANT    . TEE WITHOUT CARDIOVERSION N/A 12/17/2015   Procedure: TRANSESOPHAGEAL ECHOCARDIOGRAM (TEE);  Surgeon: Lelon Perla, MD;  Location: [facility] ENDOSCOPY;  Service: Cardiovascular;  Laterality: N/A;  Pt also needs a LOOP  . TRANSTHORACIC ECHOCARDIOGRAM  2008   SHOWED LEFT VENTRICULAR HYPERTROPHY AND MILD AORTIC STENOSIS    There were no vitals filed for this visit.  Subjective Assessment - 12/29/18 1302    Subjective  "A little tired, Im always tired now"    Currently in Pain?  No/denies         [facility] PT Assessment - 12/29/18 0001      Timed Up and Go Test   TUG  Normal TUG  With RW   Normal TUG (seconds)  34.91                   [facility] Adult PT Treatment/Exercise - 12/29/18 0001      Ambulation/Gait   Gait Comments  ambulated through office no AD to lobby      High Level Balance   High Level Balance Activities  Negotiating over obstacles;Negotitating around obstacles    High Level Balance Comments  forward stepping over obstacles, side stppping over half foam roll.       Lumbar Exercises: Aerobic   Elliptical  2 min fwd/ 2 min back  for reciprocol mvmt    Nustep  level 4 x 7 minutes      Lumbar Exercises: Machines for Strengthening   Cybex Knee Extension  5# 2x15    Cybex Knee Flexion   25# 2x10    Leg Press  30# 2x10      Knee/Hip Exercises: Standing   Other Standing Knee Exercises  Alt LE taps with foam roll SPC 2x10                PT Short Term Goals - 08/25/18 1153      PT SHORT TERM GOAL #1   Title  independent with her home HEP    Status  Achieved        PT Long Term Goals - 12/29/18 1346      PT LONG TERM GOAL #1   Title  decrease TUG time to 22 seconds for functional gait and safety    Status  Partially Met   36 sec     PT LONG TERM GOAL #2   Title  increase Berg balance score to 40/56 for safety    Status  On-going      PT LONG TERM GOAL #3   Title  increase LE strength (particularly ABD and ext.) to 4/5 for functional gait and safety    Status  On-going      PT LONG TERM GOAL #4   Title  be able to do a sit to stand without assistance and without losing balance for functional use    Status  Partially Met            Plan - 12/29/18 1346    Clinical Impression Statement  Pt has progressed decreasing her TUG time. Increase weight tolerated on lig press. She did well overall with  elliptical aerobic warm up but did have some LE fatigue. Difficulty with LLE initiations remains. Some instability with side steps.     Stability/Clinical Decision Making  Stable/Uncomplicated    Rehab Potential  Good    PT Frequency  2x / week    PT Treatment/Interventions  ADLs/Self Care Home Management;Electrical Stimulation;Moist Heat;Gait training;Stair training;Therapeutic activities;Functional mobility training;Therapeutic exercise;Patient/family education;Neuromuscular re-education;Balance training    PT Next Visit Plan  resume balance and strength activities to help her function safely. Check goals       Patient will benefit from skilled therapeutic intervention in order to improve the following deficits and impairments:  Abnormal gait, Pain, Postural dysfunction, Increased muscle spasms, Cardiopulmonary status limiting activity, Decreased  activity tolerance, Decreased strength, Difficulty walking, Decreased balance  Visit Diagnosis: Repeated falls  Difficulty in walking, not elsewhere classified  Balance problem  Muscle weakness (generalized)  Left hemiparesis ([place])     Problem List Patient Active Problem List   Diagnosis Date Noted  . Familial hypercholesteremia 02/21/2018  . Psychogenic gait 10/21/2017  .  Coronary artery disease involving native coronary artery of native heart without angina pectoris 06/09/2017  . Abnormal nuclear cardiac imaging test 05/25/2017  . PVC's (premature ventricular contractions) 05/17/2017  . Anxiety 12/29/2016  . Vascular parkinsonism ([place]) 06/29/2016  . Gait disorder 04/07/2016  . Intracranial carotid stenosis, bilateral 02/27/2016  . Essential hypertension   . Left-sided weakness 12/28/2015  . Headache 12/28/2015  . Adjustment reaction with anxiety 12/20/2015  . Left hemiparesis ([place])   . History of CVA with residual deficit   . Prediabetes   . SVT (supraventricular tachycardia) ([place]) 12/14/2015  . Stroke-like symptom 12/13/2015  . History of stroke   . Heart palpitations 09/06/2012  . Hypertension   . Familial hyperlipidemia   . Hypothyroidism   . Vitamin D deficiency   . Palpitations   . History of dizziness   . LVH (left ventricular hypertrophy)   . Aortic stenosis   . Mitral regurgitation   . SOB (shortness of breath)   . Difficulty walking     Scot Jun, PTA 12/29/2018, 1:51 PM  [place], [place], [address] Phone: [phone]   Fax:  [phone]  Name: Stacey Ramirez MRN: [id number] Date of Birth: [birth year]

## 2019-01-01 NOTE — Progress Notes (Signed)
Carelink Summary Report / Loop Recorder 

## 2019-01-02 ENCOUNTER — Other Ambulatory Visit: Payer: Self-pay

## 2019-01-02 ENCOUNTER — Ambulatory Visit: Payer: PPO | Attending: Specialist | Admitting: Physical Therapy

## 2019-01-02 ENCOUNTER — Ambulatory Visit: Payer: PPO | Admitting: Physical Therapy

## 2019-01-02 ENCOUNTER — Encounter: Payer: Self-pay | Admitting: Physical Therapy

## 2019-01-02 DIAGNOSIS — G8194 Hemiplegia, unspecified affecting left nondominant side: Secondary | ICD-10-CM | POA: Insufficient documentation

## 2019-01-02 DIAGNOSIS — R296 Repeated falls: Secondary | ICD-10-CM | POA: Insufficient documentation

## 2019-01-02 DIAGNOSIS — R2689 Other abnormalities of gait and mobility: Secondary | ICD-10-CM | POA: Insufficient documentation

## 2019-01-02 DIAGNOSIS — R262 Difficulty in walking, not elsewhere classified: Secondary | ICD-10-CM | POA: Diagnosis not present

## 2019-01-02 DIAGNOSIS — N390 Urinary tract infection, site not specified: Secondary | ICD-10-CM | POA: Diagnosis not present

## 2019-01-02 DIAGNOSIS — M6281 Muscle weakness (generalized): Secondary | ICD-10-CM | POA: Diagnosis not present

## 2019-01-02 NOTE — Therapy (Signed)
[place], [place], [address] Phone: [phone]   Fax:  [phone]  Physical Therapy Treatment  Patient Details  Name: Stacey Ramirez MRN: [id number] Date of Birth: 08/24/1940 Referring Provider (PT): Pharr   Encounter Date: 01/02/2019  PT End of Session - 01/02/19 1145    Visit Number  5    Date for PT Re-Evaluation  02/11/19    PT Start Time  1100    PT Stop Time  1145    PT Time Calculation (min)  45 min    Activity Tolerance  Patient tolerated treatment well    Behavior During Therapy  [facility] for tasks assessed/performed       Past Medical History:  Diagnosis Date  . Adjustment reaction with anxiety 12/20/2015  . Aortic stenosis   . CAD (coronary artery disease) 06/09/2017   Nuc study 10/18: EF 69, inferolateral perfusion defect-possible small infarct with peri-infarct ischemia; intermediate risk // LHC 10/18: pLAD 25, pLCx 50, pRCA 25 >> med Rx  . Carotid stenosis, asymptomatic, bilateral 02/27/2016   Carotid US 5/17: Bilateral ICA 1-39  . Cerebrovascular accident (stroke) ([place])    a. 12/2015 - cryptogenic.  S/P MDT Linq.  . Difficulty walking   . Essential hypertension   . Gait disorder 04/07/2016  . Heart palpitations 09/06/2012  . History of CVA with residual deficit 12/14/2015  . History of dizziness   . History of echocardiogram    Echo 10/18: Vigorous LVF, EF 65-70, normal wall motion, grade 1 diastolic dysfunction, GLS -21.1%, mild RAE  . History of nuclear stress test    Nuclear stress test 10/18: EF 69, inf-lat defect c/w poss infarct with peri-infarct ischemia; Intermediate Risk  . Hyperlipidemia   . Hypothyroidism   . Left hemiparesis ([place])   . LVH (left ventricular hypertrophy)   . Mitral regurgitation    a. 12/2015 Echo: EF 60-65%, mild focal basal hypertrophy. No rwma, triv AI, mild MR, mildly dil LA w/o thrombus. No RA thrombus. No PFO.  Marland Kitchen Palpitations   . Prediabetes   .  Stroke-like symptom 12/13/2015  . SVT (supraventricular tachycardia) ([place]) 12/14/2015  . Vascular parkinsonism ([place]) 06/29/2016  . Vitamin D deficiency     Past Surgical History:  Procedure Laterality Date  . CARDIOVASCULAR STRESS TEST  2002   NORMAL  . EP IMPLANTABLE DEVICE N/A 12/17/2015   Procedure: Loop Recorder Insertion;  Surgeon: [name], MD;  Location: [facility];  Service: Cardiovascular;  Laterality: N/A;  . LEFT HEART CATH AND CORONARY ANGIOGRAPHY N/A 05/25/2017   Procedure: LEFT HEART CATH AND CORONARY ANGIOGRAPHY;  Surgeon: Sherren Mocha, MD;  Location: [place] [facility];  Service: Cardiovascular;  Laterality: N/A;  . LOOP RECORDER IMPLANT    . TEE WITHOUT CARDIOVERSION N/A 12/17/2015   Procedure: TRANSESOPHAGEAL ECHOCARDIOGRAM (TEE);  Surgeon: Lelon Perla, MD;  Location: [facility] ENDOSCOPY;  Service: Cardiovascular;  Laterality: N/A;  Pt also needs a LOOP  . TRANSTHORACIC ECHOCARDIOGRAM  2008   SHOWED LEFT VENTRICULAR HYPERTROPHY AND MILD AORTIC STENOSIS    There were no vitals filed for this visit.  Subjective Assessment - 01/02/19 1105    Subjective  "Tired and worn out "    Currently in Pain?  No/denies                       [facility] Adult PT Treatment/Exercise - 01/02/19 0001      Ambulation/Gait  Gait Comments  ambulated through office no AD to lobby      High Level Balance   High Level Balance Activities  Negotiating over obstacles;Negotitating around obstacles    High Level Balance Comments  forward stepping over obstacles, side stppping over dowl rod.       Lumbar Exercises: Aerobic   Elliptical  I5 R3 2 min     Nustep  level 3 x 6 minutes      Lumbar Exercises: Machines for Strengthening   Cybex Knee Extension  10# 2x10    Cybex Knee Flexion  25# 2x10      Knee/Hip Exercises: Standing   Forward Step Up  Both;Hand Hold: 1;2 sets;5 reps;Step Height: 4"    Other Standing Knee Exercises  Standing march HHA x2 & 2 2lb   2x10                 PT Short Term Goals - 08/25/18 1153      PT SHORT TERM GOAL #1   Title  independent with her home HEP    Status  Achieved        PT Long Term Goals - 12/29/18 1346      PT LONG TERM GOAL #1   Title  decrease TUG time to 22 seconds for functional gait and safety    Status  Partially Met   36 sec     PT LONG TERM GOAL #2   Title  increase Berg balance score to 40/56 for safety    Status  On-going      PT LONG TERM GOAL #3   Title  increase LE strength (particularly ABD and ext.) to 4/5 for functional gait and safety    Status  On-going      PT LONG TERM GOAL #4   Title  be able to do a sit to stand without assistance and without losing balance for functional use    Status  Partially Met            Plan - 01/02/19 1145    Clinical Impression Statement  Pt has stop early on elliptical warm up due to fatigue. Increase weight tolerated with leg extensions with good ROM. She has the most difficult initiating gait after standing. She did better overall with negotiation over obstacles. Cues for knee elevation needed with standing march.     Stability/Clinical Decision Making  Stable/Uncomplicated    Rehab Potential  Good    PT Frequency  2x / week    PT Duration  8 weeks    PT Treatment/Interventions  ADLs/Self Care Home Management;Electrical Stimulation;Moist Heat;Gait training;Stair training;Therapeutic activities;Functional mobility training;Therapeutic exercise;Patient/family education;Neuromuscular re-education;Balance training    PT Next Visit Plan  resume balance and strength activities to help her function safely.        Patient will benefit from skilled therapeutic intervention in order to improve the following deficits and impairments:  Abnormal gait, Pain, Postural dysfunction, Increased muscle spasms, Cardiopulmonary status limiting activity, Decreased activity tolerance, Decreased strength, Difficulty walking, Decreased balance  Visit  Diagnosis: Difficulty in walking, not elsewhere classified  Repeated falls  Balance problem     Problem List Patient Active Problem List   Diagnosis Date Noted  . Familial hypercholesteremia 02/21/2018  . Psychogenic gait 10/21/2017  . Coronary artery disease involving native coronary artery of native heart without angina pectoris 06/09/2017  . Abnormal nuclear cardiac imaging test 05/25/2017  . PVC's (premature ventricular contractions) 05/17/2017  . Anxiety 12/29/2016  .  Vascular parkinsonism ([place]) 06/29/2016  . Gait disorder 04/07/2016  . Intracranial carotid stenosis, bilateral 02/27/2016  . Essential hypertension   . Left-sided weakness 12/28/2015  . Headache 12/28/2015  . Adjustment reaction with anxiety 12/20/2015  . Left hemiparesis ([place])   . History of CVA with residual deficit   . Prediabetes   . SVT (supraventricular tachycardia) ([place]) 12/14/2015  . Stroke-like symptom 12/13/2015  . History of stroke   . Heart palpitations 09/06/2012  . Hypertension   . Familial hyperlipidemia   . Hypothyroidism   . Vitamin D deficiency   . Palpitations   . History of dizziness   . LVH (left ventricular hypertrophy)   . Aortic stenosis   . Mitral regurgitation   . SOB (shortness of breath)   . Difficulty walking     Scot Jun, PTA 01/02/2019, 11:47 AM  [place] [place], [place], [address] Phone: [phone]   Fax:  [phone]  Name: Stacey Ramirez MRN: [id number] Date of Birth: [birth year]

## 2019-01-04 DIAGNOSIS — I63231 Cerebral infarction due to unspecified occlusion or stenosis of right carotid arteries: Secondary | ICD-10-CM | POA: Diagnosis not present

## 2019-01-04 DIAGNOSIS — R531 Weakness: Secondary | ICD-10-CM | POA: Diagnosis not present

## 2019-01-04 DIAGNOSIS — G20 Parkinson's disease: Secondary | ICD-10-CM | POA: Diagnosis not present

## 2019-01-05 ENCOUNTER — Other Ambulatory Visit: Payer: Self-pay

## 2019-01-05 ENCOUNTER — Encounter: Payer: Self-pay | Admitting: Physical Therapy

## 2019-01-05 ENCOUNTER — Ambulatory Visit: Payer: PPO | Admitting: Physical Therapy

## 2019-01-05 DIAGNOSIS — R262 Difficulty in walking, not elsewhere classified: Secondary | ICD-10-CM | POA: Diagnosis not present

## 2019-01-05 DIAGNOSIS — R2689 Other abnormalities of gait and mobility: Secondary | ICD-10-CM

## 2019-01-05 DIAGNOSIS — R296 Repeated falls: Secondary | ICD-10-CM

## 2019-01-05 NOTE — Therapy (Signed)
[place], [place], [address] Phone: [phone]   Fax:  [phone]  Physical Therapy Treatment  Patient Details  Name: Stacey Ramirez MRN: [id number] Date of Birth: 1941-03-05 Referring Provider (PT): Pharr   Encounter Date: 01/05/2019  PT End of Session - 01/05/19 7616    Visit Number  6    Number of Visits  16    Date for PT Re-Evaluation  02/11/19    PT Start Time  1300    PT Stop Time  1345    PT Time Calculation (min)  45 min       Past Medical History:  Diagnosis Date  . Adjustment reaction with anxiety 12/20/2015  . Aortic stenosis   . CAD (coronary artery disease) 06/09/2017   Nuc study 10/18: EF 69, inferolateral perfusion defect-possible small infarct with peri-infarct ischemia; intermediate risk // LHC 10/18: pLAD 25, pLCx 50, pRCA 25 >> med Rx  . Carotid stenosis, asymptomatic, bilateral 02/27/2016   Carotid US 5/17: Bilateral ICA 1-39  . Cerebrovascular accident (stroke) ([place])    a. 12/2015 - cryptogenic.  S/P MDT Linq.  . Difficulty walking   . Essential hypertension   . Gait disorder 04/07/2016  . Heart palpitations 09/06/2012  . History of CVA with residual deficit 12/14/2015  . History of dizziness   . History of echocardiogram    Echo 10/18: Vigorous LVF, EF 65-70, normal wall motion, grade 1 diastolic dysfunction, GLS -21.1%, mild RAE  . History of nuclear stress test    Nuclear stress test 10/18: EF 69, inf-lat defect c/w poss infarct with peri-infarct ischemia; Intermediate Risk  . Hyperlipidemia   . Hypothyroidism   . Left hemiparesis ([place])   . LVH (left ventricular hypertrophy)   . Mitral regurgitation    a. 12/2015 Echo: EF 60-65%, mild focal basal hypertrophy. No rwma, triv AI, mild MR, mildly dil LA w/o thrombus. No RA thrombus. No PFO.  Marland Kitchen Palpitations   . Prediabetes   . Stroke-like symptom 12/13/2015  . SVT (supraventricular tachycardia) ([place]) 12/14/2015  .  Vascular parkinsonism ([place]) 06/29/2016  . Vitamin D deficiency     Past Surgical History:  Procedure Laterality Date  . CARDIOVASCULAR STRESS TEST  2002   NORMAL  . EP IMPLANTABLE DEVICE N/A 12/17/2015   Procedure: Loop Recorder Insertion;  Surgeon: [name], MD;  Location: [place] [facility];  Service: Cardiovascular;  Laterality: N/A;  . LEFT HEART CATH AND CORONARY ANGIOGRAPHY N/A 05/25/2017   Procedure: LEFT HEART CATH AND CORONARY ANGIOGRAPHY;  Surgeon: Sherren Mocha, MD;  Location: [facility];  Service: Cardiovascular;  Laterality: N/A;  . LOOP RECORDER IMPLANT    . TEE WITHOUT CARDIOVERSION N/A 12/17/2015   Procedure: TRANSESOPHAGEAL ECHOCARDIOGRAM (TEE);  Surgeon: Lelon Perla, MD;  Location: [facility] ENDOSCOPY;  Service: Cardiovascular;  Laterality: N/A;  Pt also needs a LOOP  . TRANSTHORACIC ECHOCARDIOGRAM  2008   SHOWED LEFT VENTRICULAR HYPERTROPHY AND MILD AORTIC STENOSIS    There were no vitals filed for this visit.  Subjective Assessment - 01/05/19 1306    Subjective  "Im just tired"     No pain                  [facility] Adult PT Treatment/Exercise - 01/05/19 0001      Ambulation/Gait   Gait Comments  ambulated through office no AD to lobby      High Level Balance  High Level Balance Activities  Side stepping    High Level Balance Comments  CGA      Lumbar Exercises: Aerobic   Elliptical  I4 R4 3 min     Nustep  level 3 x 6 minutes      Lumbar Exercises: Machines for Strengthening   Leg Press  30# 2x10      Lumbar Exercises: Seated   Sit to Stand  10 reps   blue chair RUE support     Shoulder Exercises: Standing   Extension  Strengthening;20 reps;Both   on airex   Theraband Level (Shoulder Extension)  Level 2 (Red)    Row  Theraband;20 reps;Both;Strengthening   on airex    Theraband Level (Shoulder Row)  Level 2 (Red)               PT Short Term Goals - 08/25/18 1153      PT SHORT TERM GOAL #1   Title  independent  with her home HEP    Status  Achieved        PT Long Term Goals - 12/29/18 1346      PT LONG TERM GOAL #1   Title  decrease TUG time to 22 seconds for functional gait and safety    Status  Partially Met   36 sec     PT LONG TERM GOAL #2   Title  increase Berg balance score to 40/56 for safety    Status  On-going      PT LONG TERM GOAL #3   Title  increase LE strength (particularly ABD and ext.) to 4/5 for functional gait and safety    Status  On-going      PT LONG TERM GOAL #4   Title  be able to do a sit to stand without assistance and without losing balance for functional use    Status  Partially Met            Plan - 01/05/19 1348    Clinical Impression Statement  Again fatigue with elliptical warm up. she did more work on leg press with an additional set. She still has most difficulty initiating a step after standing. Some instability with airex standing. She did well with step ups HHA x 1 needed.  CGA needed at times with mobility and with side step    Stability/Clinical Decision Making  Stable/Uncomplicated    Rehab Potential  Good    PT Frequency  2x / week    PT Duration  8 weeks    PT Next Visit Plan  resume balance and strength activities to help her function safely.        Patient will benefit from skilled therapeutic intervention in order to improve the following deficits and impairments:  Abnormal gait, Pain, Postural dysfunction, Increased muscle spasms, Cardiopulmonary status limiting activity, Decreased activity tolerance, Decreased strength, Difficulty walking, Decreased balance  Visit Diagnosis: Difficulty in walking, not elsewhere classified  Repeated falls  Balance problem     Problem List Patient Active Problem List   Diagnosis Date Noted  . Familial hypercholesteremia 02/21/2018  . Psychogenic gait 10/21/2017  . Coronary artery disease involving native coronary artery of native heart without angina pectoris 06/09/2017  . Abnormal  nuclear cardiac imaging test 05/25/2017  . PVC's (premature ventricular contractions) 05/17/2017  . Anxiety 12/29/2016  . Vascular parkinsonism ([place]) 06/29/2016  . Gait disorder 04/07/2016  . Intracranial carotid stenosis, bilateral 02/27/2016  . Essential hypertension   . Left-sided weakness  12/28/2015  . Headache 12/28/2015  . Adjustment reaction with anxiety 12/20/2015  . Left hemiparesis ([place])   . History of CVA with residual deficit   . Prediabetes   . SVT (supraventricular tachycardia) ([place]) 12/14/2015  . Stroke-like symptom 12/13/2015  . History of stroke   . Heart palpitations 09/06/2012  . Hypertension   . Familial hyperlipidemia   . Hypothyroidism   . Vitamin D deficiency   . Palpitations   . History of dizziness   . LVH (left ventricular hypertrophy)   . Aortic stenosis   . Mitral regurgitation   . SOB (shortness of breath)   . Difficulty walking     Scot Jun, PTA 01/05/2019, 1:52 PM  [place], [place], [address] Phone: [phone]   Fax:  [phone]  Name: KAWEHI HOSTETTER MRN: [id number] Date of Birth: 1940/12/04

## 2019-01-09 ENCOUNTER — Ambulatory Visit: Payer: PPO | Admitting: Physical Therapy

## 2019-01-09 ENCOUNTER — Other Ambulatory Visit: Payer: Self-pay

## 2019-01-09 ENCOUNTER — Encounter: Payer: Self-pay | Admitting: Physical Therapy

## 2019-01-09 DIAGNOSIS — R296 Repeated falls: Secondary | ICD-10-CM

## 2019-01-09 DIAGNOSIS — R262 Difficulty in walking, not elsewhere classified: Secondary | ICD-10-CM | POA: Diagnosis not present

## 2019-01-09 DIAGNOSIS — R2689 Other abnormalities of gait and mobility: Secondary | ICD-10-CM

## 2019-01-09 DIAGNOSIS — M6281 Muscle weakness (generalized): Secondary | ICD-10-CM

## 2019-01-09 NOTE — Therapy (Signed)
[place], [place], [address] Phone: [phone]   Fax:  [phone]  Physical Therapy Treatment  Patient Details  Name: Stacey Ramirez MRN: [id number] Date of Birth: 03-12-1941 Referring Provider (PT): Pharr   Encounter Date: 01/09/2019  PT End of Session - 01/09/19 1355    Visit Number  7    Number of Visits  16    Date for PT Re-Evaluation  02/11/19    PT Start Time  1300    PT Stop Time  1350    PT Time Calculation (min)  50 min       Past Medical History:  Diagnosis Date  . Adjustment reaction with anxiety 12/20/2015  . Aortic stenosis   . CAD (coronary artery disease) 06/09/2017   Nuc study 10/18: EF 69, inferolateral perfusion defect-possible small infarct with peri-infarct ischemia; intermediate risk // LHC 10/18: pLAD 25, pLCx 50, pRCA 25 >> med Rx  . Carotid stenosis, asymptomatic, bilateral 02/27/2016   Carotid US 5/17: Bilateral ICA 1-39  . Cerebrovascular accident (stroke) ([place])    a. 12/2015 - cryptogenic.  S/P MDT Linq.  . Difficulty walking   . Essential hypertension   . Gait disorder 04/07/2016  . Heart palpitations 09/06/2012  . History of CVA with residual deficit 12/14/2015  . History of dizziness   . History of echocardiogram    Echo 10/18: Vigorous LVF, EF 65-70, normal wall motion, grade 1 diastolic dysfunction, GLS -21.1%, mild RAE  . History of nuclear stress test    Nuclear stress test 10/18: EF 69, inf-lat defect c/w poss infarct with peri-infarct ischemia; Intermediate Risk  . Hyperlipidemia   . Hypothyroidism   . Left hemiparesis ([place])   . LVH (left ventricular hypertrophy)   . Mitral regurgitation    a. 12/2015 Echo: EF 60-65%, mild focal basal hypertrophy. No rwma, triv AI, mild MR, mildly dil LA w/o thrombus. No RA thrombus. No PFO.  Marland Kitchen Palpitations   . Prediabetes   . Stroke-like symptom 12/13/2015  . SVT (supraventricular tachycardia) ([place]) 12/14/2015  .  Vascular parkinsonism ([place]) 06/29/2016  . Vitamin D deficiency     Past Surgical History:  Procedure Laterality Date  . CARDIOVASCULAR STRESS TEST  2002   NORMAL  . EP IMPLANTABLE DEVICE N/A 12/17/2015   Procedure: Loop Recorder Insertion;  Surgeon: [name], MD;  Location: [facility];  Service: Cardiovascular;  Laterality: N/A;  . LEFT HEART CATH AND CORONARY ANGIOGRAPHY N/A 05/25/2017   Procedure: LEFT HEART CATH AND CORONARY ANGIOGRAPHY;  Surgeon: Sherren Mocha, MD;  Location: [facility];  Service: Cardiovascular;  Laterality: N/A;  . LOOP RECORDER IMPLANT    . TEE WITHOUT CARDIOVERSION N/A 12/17/2015   Procedure: TRANSESOPHAGEAL ECHOCARDIOGRAM (TEE);  Surgeon: Lelon Perla, MD;  Location: [facility] ENDOSCOPY;  Service: Cardiovascular;  Laterality: N/A;  Pt also needs a LOOP  . TRANSTHORACIC ECHOCARDIOGRAM  2008   SHOWED LEFT VENTRICULAR HYPERTROPHY AND MILD AORTIC STENOSIS    There were no vitals filed for this visit.  Subjective Assessment - 01/09/19 1304    Subjective  "I am feeling pretty good, tired as all ways"    Currently in Pain?  No/denies                       [facility] Adult PT Treatment/Exercise - 01/09/19 0001      Ambulation/Gait   Gait Comments  Ambulated around clinic HHA  x2 with slight anterior pull.  Ambulated from clininc to car LOB x2 no assist to correct. 4 standing rest breaks needed. Cues to keep pt fouused on task.      High Level Balance   High Level Balance Activities  Side stepping    High Level Balance Comments  over dowel rod CGA      Lumbar Exercises: Aerobic   Tread Mill  11mh 3 minutes, a lot of cues to pick up the left foot    Nustep  level 3 x 6 minutes      Lumbar Exercises: Machines for Strengthening   Cybex Knee Extension  10# 2x15    Cybex Knee Flexion  25# 2x15      Knee/Hip Exercises: Standing   Other Standing Knee Exercises  Alt LE taps with foam roll SPC 2x10     Other Standing Knee Exercises   Standing march HHA x2 & 2 3lb   2x10                PT Short Term Goals - 08/25/18 1153      PT SHORT TERM GOAL #1   Title  independent with her home HEP    Status  Achieved        PT Long Term Goals - 12/29/18 1346      PT LONG TERM GOAL #1   Title  decrease TUG time to 22 seconds for functional gait and safety    Status  Partially Met   36 sec     PT LONG TERM GOAL #2   Title  increase Berg balance score to 40/56 for safety    Status  On-going      PT LONG TERM GOAL #3   Title  increase LE strength (particularly ABD and ext.) to 4/5 for functional gait and safety    Status  On-going      PT LONG TERM GOAL #4   Title  be able to do a sit to stand without assistance and without losing balance for functional use    Status  Partially Met            Plan - 01/09/19 1356    Clinical Impression Statement  Curs needed to increase step length with side steps. She does all hr interventions well with HHA but independently she tends to hesitate and lose focus. Pt is very talkative in session sometimes losing focus on the task. increase reps tolerated with leg curls and extensions.  Some lob with alt taps with foam roll     Rehab Potential  Good    PT Frequency  2x / week    PT Duration  8 weeks    PT Treatment/Interventions  ADLs/Self Care Home Management;Electrical Stimulation;Moist Heat;Gait training;Stair training;Therapeutic activities;Functional mobility training;Therapeutic exercise;Patient/family education;Neuromuscular re-education;Balance training    PT Next Visit Plan  resume balance and strength activities to help her function safely.        Patient will benefit from skilled therapeutic intervention in order to improve the following deficits and impairments:  Abnormal gait, Pain, Postural dysfunction, Increased muscle spasms, Cardiopulmonary status limiting activity, Decreased activity tolerance, Decreased strength, Difficulty walking, Decreased  balance  Visit Diagnosis: Difficulty in walking, not elsewhere classified  Repeated falls  Balance problem  Muscle weakness (generalized)     Problem List Patient Active Problem List   Diagnosis Date Noted  . Familial hypercholesteremia 02/21/2018  . Psychogenic gait 10/21/2017  . Coronary artery disease involving native coronary artery  of native heart without angina pectoris 06/09/2017  . Abnormal nuclear cardiac imaging test 05/25/2017  . PVC's (premature ventricular contractions) 05/17/2017  . Anxiety 12/29/2016  . Vascular parkinsonism ([place]) 06/29/2016  . Gait disorder 04/07/2016  . Intracranial carotid stenosis, bilateral 02/27/2016  . Essential hypertension   . Left-sided weakness 12/28/2015  . Headache 12/28/2015  . Adjustment reaction with anxiety 12/20/2015  . Left hemiparesis ([place])   . History of CVA with residual deficit   . Prediabetes   . SVT (supraventricular tachycardia) ([place]) 12/14/2015  . Stroke-like symptom 12/13/2015  . History of stroke   . Heart palpitations 09/06/2012  . Hypertension   . Familial hyperlipidemia   . Hypothyroidism   . Vitamin D deficiency   . Palpitations   . History of dizziness   . LVH (left ventricular hypertrophy)   . Aortic stenosis   . Mitral regurgitation   . SOB (shortness of breath)   . Difficulty walking     Scot Jun 01/09/2019, 1:59 PM  [place] [address] [place], [place], [address] Phone: [phone]   Fax:  [phone]  Name: Stacey Ramirez MRN: [id number] Date of Birth: 1940/09/26

## 2019-01-10 DIAGNOSIS — M2550 Pain in unspecified joint: Secondary | ICD-10-CM | POA: Diagnosis not present

## 2019-01-10 DIAGNOSIS — G20 Parkinson's disease: Secondary | ICD-10-CM | POA: Diagnosis not present

## 2019-01-10 DIAGNOSIS — M25559 Pain in unspecified hip: Secondary | ICD-10-CM | POA: Diagnosis not present

## 2019-01-10 DIAGNOSIS — R531 Weakness: Secondary | ICD-10-CM | POA: Diagnosis not present

## 2019-01-10 DIAGNOSIS — R269 Unspecified abnormalities of gait and mobility: Secondary | ICD-10-CM | POA: Diagnosis not present

## 2019-01-10 DIAGNOSIS — Z8669 Personal history of other diseases of the nervous system and sense organs: Secondary | ICD-10-CM | POA: Diagnosis not present

## 2019-01-10 DIAGNOSIS — M1990 Unspecified osteoarthritis, unspecified site: Secondary | ICD-10-CM | POA: Diagnosis not present

## 2019-01-10 DIAGNOSIS — M549 Dorsalgia, unspecified: Secondary | ICD-10-CM | POA: Diagnosis not present

## 2019-01-12 ENCOUNTER — Encounter: Payer: Self-pay | Admitting: Physical Therapy

## 2019-01-12 ENCOUNTER — Ambulatory Visit: Payer: PPO | Admitting: Physical Therapy

## 2019-01-12 ENCOUNTER — Other Ambulatory Visit: Payer: Self-pay

## 2019-01-12 DIAGNOSIS — R262 Difficulty in walking, not elsewhere classified: Secondary | ICD-10-CM | POA: Diagnosis not present

## 2019-01-12 DIAGNOSIS — G8194 Hemiplegia, unspecified affecting left nondominant side: Secondary | ICD-10-CM

## 2019-01-12 DIAGNOSIS — I10 Essential (primary) hypertension: Secondary | ICD-10-CM | POA: Diagnosis not present

## 2019-01-12 DIAGNOSIS — R609 Edema, unspecified: Secondary | ICD-10-CM | POA: Diagnosis not present

## 2019-01-12 DIAGNOSIS — E782 Mixed hyperlipidemia: Secondary | ICD-10-CM | POA: Diagnosis not present

## 2019-01-12 DIAGNOSIS — M6281 Muscle weakness (generalized): Secondary | ICD-10-CM

## 2019-01-12 DIAGNOSIS — R2689 Other abnormalities of gait and mobility: Secondary | ICD-10-CM

## 2019-01-12 DIAGNOSIS — R5382 Chronic fatigue, unspecified: Secondary | ICD-10-CM | POA: Diagnosis not present

## 2019-01-12 DIAGNOSIS — R0600 Dyspnea, unspecified: Secondary | ICD-10-CM | POA: Diagnosis not present

## 2019-01-12 DIAGNOSIS — R296 Repeated falls: Secondary | ICD-10-CM

## 2019-01-12 DIAGNOSIS — R531 Weakness: Secondary | ICD-10-CM | POA: Diagnosis not present

## 2019-01-12 NOTE — Therapy (Signed)
[facility] [address] [place], [place], [address] Phone: [phone]   Fax:  [phone]  Physical Therapy Treatment  Patient Details  Name: Stacey Ramirez MRN: [id number] Date of Birth: 03/06/1941 Referring Provider (PT): Pharr   Encounter Date: 01/12/2019  PT End of Session - 01/12/19 1352    Visit Number  8    Number of Visits  16    Date for PT Re-Evaluation  02/11/19    PT Start Time  1308    PT Stop Time  1350    PT Time Calculation (min)  42 min    Activity Tolerance  Patient tolerated treatment well    Behavior During Therapy  WFL for tasks assessed/performed       Past Medical History:  Diagnosis Date  . Adjustment reaction with anxiety 12/20/2015  . Aortic stenosis   . CAD (coronary artery disease) 06/09/2017   Nuc study 10/18: EF 69, inferolateral perfusion defect-possible small infarct with peri-infarct ischemia; intermediate risk // LHC 10/18: pLAD 25, pLCx 50, pRCA 25 >> med Rx  . Carotid stenosis, asymptomatic, bilateral 02/27/2016   Carotid US 5/17: Bilateral ICA 1-39  . Cerebrovascular accident (stroke) ([facility])    a. 12/2015 - cryptogenic.  S/P MDT Linq.  . Difficulty walking   . Essential hypertension   . Gait disorder 04/07/2016  . Heart palpitations 09/06/2012  . History of CVA with residual deficit 12/14/2015  . History of dizziness   . History of echocardiogram    Echo 10/18: Vigorous LVF, EF 65-70, normal wall motion, grade 1 diastolic dysfunction, GLS -21.1%, mild RAE  . History of nuclear stress test    Nuclear stress test 10/18: EF 69, inf-lat defect c/w poss infarct with peri-infarct ischemia; Intermediate Risk  . Hyperlipidemia   . Hypothyroidism   . Left hemiparesis ([facility])   . LVH (left ventricular hypertrophy)   . Mitral regurgitation    a. 12/2015 Echo: EF 60-65%, mild focal basal hypertrophy. No rwma, triv AI, mild MR, mildly dil LA w/o thrombus. No RA thrombus. No PFO.  .  Palpitations   . Prediabetes   . Stroke-like symptom 12/13/2015  . SVT (supraventricular tachycardia) ([facility]) 12/14/2015  . Vascular parkinsonism ([facility]) 06/29/2016  . Vitamin D deficiency     Past Surgical History:  Procedure Laterality Date  . CARDIOVASCULAR STRESS TEST  2002   NORMAL  . EP IMPLANTABLE DEVICE N/A 12/17/2015   Procedure: Loop Recorder Insertion;  Surgeon: James Allred, MD;  Location: [facility];  Service: Cardiovascular;  Laterality: N/A;  . LEFT HEART CATH AND CORONARY ANGIOGRAPHY N/A 05/25/2017   Procedure: LEFT HEART CATH AND CORONARY ANGIOGRAPHY;  Surgeon: Cooper, Michael, MD;  Location: [facility];  Service: Cardiovascular;  Laterality: N/A;  . LOOP RECORDER IMPLANT    . TEE WITHOUT CARDIOVERSION N/A 12/17/2015   Procedure: TRANSESOPHAGEAL ECHOCARDIOGRAM (TEE);  Surgeon: Brian S Crenshaw, MD;  Location: [facility] ENDOSCOPY;  Service: Cardiovascular;  Laterality: N/A;  Pt also needs a LOOP  . TRANSTHORACIC ECHOCARDIOGRAM  2008   SHOWED LEFT VENTRICULAR HYPERTROPHY AND MILD AORTIC STENOSIS    There were no vitals filed for this visit.  Subjective Assessment - 01/12/19 1312    Subjective  "Tired"    Currently in Pain?  No/denies                       [facility] Adult PT Treatment/Exercise - 01/12/19 0001        High Level Balance   High Level Balance Activities  Side stepping      Lumbar Exercises: Aerobic   Tread Mill  79mh 3 minutes, a lot of cues to pick up the left foot    Nustep  level 3 x 6 minutes      Lumbar Exercises: Machines for Strengthening   Cybex Knee Extension  10# 2x15    Cybex Knee Flexion  25# 2x15    Leg Press  30# 3x10      Knee/Hip Exercises: Standing   Other Standing Knee Exercises  Alt box taps 2x10     Other Standing Knee Exercises  Standing march HHA x1 for 10 reps  each arm, x5 without Assist      Knee/Hip Exercises: Seated   Sit to Sand  2 sets;without UE support   Airex on mat table                PT Short Term Goals - 08/25/18 1153      PT SHORT TERM GOAL #1   Title  independent with her home HEP    Status  Achieved        PT Long Term Goals - 12/29/18 1346      PT LONG TERM GOAL #1   Title  decrease TUG time to 22 seconds for functional gait and safety    Status  Partially Met   36 sec     PT LONG TERM GOAL #2   Title  increase Berg balance score to 40/56 for safety    Status  On-going      PT LONG TERM GOAL #3   Title  increase LE strength (particularly ABD and ext.) to 4/5 for functional gait and safety    Status  On-going      PT LONG TERM GOAL #4   Title  be able to do a sit to stand without assistance and without losing balance for functional use    Status  Partially Met            Plan - 01/12/19 1352    Clinical Impression Statement  Pt ~ 8 minutes late for today's session. No pain reported throughout. Increase time needed after standing to take step to start gait. Cues to reman square with side step but was able to complete with SBA only. Cues to lean forward with sit to stand, pt tends to lean back pushing LE against table with standing. HHA x1 with alt box taps.    Stability/Clinical Decision Making  Stable/Uncomplicated    Rehab Potential  Good    PT Frequency  2x / week    PT Duration  8 weeks    PT Treatment/Interventions  ADLs/Self Care Home Management;Electrical Stimulation;Moist Heat;Gait training;Stair training;Therapeutic activities;Functional mobility training;Therapeutic exercise;Patient/family education;Neuromuscular re-education;Balance training    PT Next Visit Plan  resume balance and strength activities to help her function safely.        Patient will benefit from skilled therapeutic intervention in order to improve the following deficits and impairments:  Abnormal gait, Pain, Postural dysfunction, Increased muscle spasms, Cardiopulmonary status limiting activity, Decreased activity tolerance, Decreased strength,  Difficulty walking, Decreased balance  Visit Diagnosis: Balance problem  Repeated falls  Muscle weakness (generalized)  Difficulty in walking, not elsewhere classified  Left hemiparesis ([facility]     Problem List Patient Active Problem List   Diagnosis Date Noted  . Familial hypercholesteremia 02/21/2018  . Psychogenic gait 10/21/2017  . Coronary artery disease involving  native coronary artery of native heart without angina pectoris 06/09/2017  . Abnormal nuclear cardiac imaging test 05/25/2017  . PVC's (premature ventricular contractions) 05/17/2017  . Anxiety 12/29/2016  . Vascular parkinsonism ([facility]) 06/29/2016  . Gait disorder 04/07/2016  . Intracranial carotid stenosis, bilateral 02/27/2016  . Essential hypertension   . Left-sided weakness 12/28/2015  . Headache 12/28/2015  . Adjustment reaction with anxiety 12/20/2015  . Left hemiparesis ([facility])   . History of CVA with residual deficit   . Prediabetes   . SVT (supraventricular tachycardia) ([facility]) 12/14/2015  . Stroke-like symptom 12/13/2015  . History of stroke   . Heart palpitations 09/06/2012  . Hypertension   . Familial hyperlipidemia   . Hypothyroidism   . Vitamin D deficiency   . Palpitations   . History of dizziness   . LVH (left ventricular hypertrophy)   . Aortic stenosis   . Mitral regurgitation   . SOB (shortness of breath)   . Difficulty walking     Ronald G Pemberton, PTA 01/12/2019, 1:55 PM  [facility] [address] [place], [place], [address] Phone: [phone]   Fax:  [phone]  Name: Laketia M Mcomber MRN: [id number] Date of Birth: 12/06/1940   

## 2019-01-13 DIAGNOSIS — E7800 Pure hypercholesterolemia, unspecified: Secondary | ICD-10-CM | POA: Diagnosis not present

## 2019-01-16 ENCOUNTER — Ambulatory Visit (INDEPENDENT_AMBULATORY_CARE_PROVIDER_SITE_OTHER): Payer: PPO | Admitting: *Deleted

## 2019-01-16 ENCOUNTER — Encounter: Payer: Self-pay | Admitting: Physical Therapy

## 2019-01-16 ENCOUNTER — Other Ambulatory Visit: Payer: Self-pay

## 2019-01-16 ENCOUNTER — Ambulatory Visit: Payer: PPO | Admitting: Physical Therapy

## 2019-01-16 DIAGNOSIS — R2689 Other abnormalities of gait and mobility: Secondary | ICD-10-CM

## 2019-01-16 DIAGNOSIS — M6281 Muscle weakness (generalized): Secondary | ICD-10-CM

## 2019-01-16 DIAGNOSIS — I639 Cerebral infarction, unspecified: Secondary | ICD-10-CM

## 2019-01-16 DIAGNOSIS — R296 Repeated falls: Secondary | ICD-10-CM

## 2019-01-16 DIAGNOSIS — R262 Difficulty in walking, not elsewhere classified: Secondary | ICD-10-CM | POA: Diagnosis not present

## 2019-01-16 LAB — CUP PACEART REMOTE DEVICE CHECK
Date Time Interrogation Session: 20200615183538
Implantable Pulse Generator Implant Date: 20170516

## 2019-01-16 NOTE — Therapy (Signed)
[place], [place], [address] Phone: [phone]   Fax:  [phone]  Physical Therapy Treatment  Patient Details  Name: Stacey Ramirez MRN: [id number] Date of Birth: August 11, 1940 Referring Provider (PT): Pharr   Encounter Date: 01/16/2019  PT End of Session - 01/16/19 1456    Visit Number  9    Number of Visits  16    Date for PT Re-Evaluation  02/11/19    Authorization - Visit Number  1400    Authorization - Number of Visits  [id number]    Activity Tolerance  Patient tolerated treatment well    Behavior During Therapy  [facility] for tasks assessed/performed       Past Medical History:  Diagnosis Date  . Adjustment reaction with anxiety 12/20/2015  . Aortic stenosis   . CAD (coronary artery disease) 06/09/2017   Nuc study 10/18: EF 69, inferolateral perfusion defect-possible small infarct with peri-infarct ischemia; intermediate risk // LHC 10/18: pLAD 25, pLCx 50, pRCA 25 >> med Rx  . Carotid stenosis, asymptomatic, bilateral 02/27/2016   Carotid US 5/17: Bilateral ICA 1-39  . Cerebrovascular accident (stroke) ([place])    a. 12/2015 - cryptogenic.  S/P MDT Linq.  . Difficulty walking   . Essential hypertension   . Gait disorder 04/07/2016  . Heart palpitations 09/06/2012  . History of CVA with residual deficit 12/14/2015  . History of dizziness   . History of echocardiogram    Echo 10/18: Vigorous LVF, EF 65-70, normal wall motion, grade 1 diastolic dysfunction, GLS -21.1%, mild RAE  . History of nuclear stress test    Nuclear stress test 10/18: EF 69, inf-lat defect c/w poss infarct with peri-infarct ischemia; Intermediate Risk  . Hyperlipidemia   . Hypothyroidism   . Left hemiparesis ([place])   . LVH (left ventricular hypertrophy)   . Mitral regurgitation    a. 12/2015 Echo: EF 60-65%, mild focal basal hypertrophy. No rwma, triv AI, mild MR, mildly dil LA w/o thrombus. No RA thrombus. No PFO.  Marland Kitchen Palpitations    . Prediabetes   . Stroke-like symptom 12/13/2015  . SVT (supraventricular tachycardia) ([place]) 12/14/2015  . Vascular parkinsonism ([place]) 06/29/2016  . Vitamin D deficiency     Past Surgical History:  Procedure Laterality Date  . CARDIOVASCULAR STRESS TEST  2002   NORMAL  . EP IMPLANTABLE DEVICE N/A 12/17/2015   Procedure: Loop Recorder Insertion;  Surgeon: [name], MD;  Location: [place] [facility];  Service: Cardiovascular;  Laterality: N/A;  . LEFT HEART CATH AND CORONARY ANGIOGRAPHY N/A 05/25/2017   Procedure: LEFT HEART CATH AND CORONARY ANGIOGRAPHY;  Surgeon: Sherren Mocha, MD;  Location: [place] [facility];  Service: Cardiovascular;  Laterality: N/A;  . LOOP RECORDER IMPLANT    . TEE WITHOUT CARDIOVERSION N/A 12/17/2015   Procedure: TRANSESOPHAGEAL ECHOCARDIOGRAM (TEE);  Surgeon: Lelon Perla, MD;  Location: [facility] ENDOSCOPY;  Service: Cardiovascular;  Laterality: N/A;  Pt also needs a LOOP  . TRANSTHORACIC ECHOCARDIOGRAM  2008   SHOWED LEFT VENTRICULAR HYPERTROPHY AND MILD AORTIC STENOSIS    There were no vitals filed for this visit.  Subjective Assessment - 01/16/19 1405    Subjective  "Tired, the R leg is bothering me" Pt reports pain in the hip    Currently in Pain?  Yes    Pain Score  1     Pain Location  Hip    Pain Orientation  Right  [facility] PT Assessment - 01/16/19 0001      Berg Balance Test   Sit to Stand  Able to stand  independently using hands    Standing Unsupported  Able to stand safely 2 minutes    Sitting with Back Unsupported but Feet Supported on Floor or Stool  Able to sit safely and securely 2 minutes    Stand to Sit  Sits safely with minimal use of hands    Transfers  Able to transfer safely, definite need of hands    Standing Unsupported with Eyes Closed  Able to stand 10 seconds safely    Standing Unsupported with Feet Together  Able to place feet together independently and stand for 1 minute with supervision    From Standing, Reach  Forward with Outstretched Arm  Can reach forward >12 cm safely (5")    From Standing Position, Pick up Object from Floor  Able to pick up shoe, needs supervision    From Standing Position, Turn to Look Behind Over each Shoulder  Turn sideways only but maintains balance    Turn 360 Degrees  Needs close supervision or verbal cueing    Standing Unsupported, Alternately Place Feet on Step/Stool  Able to complete >2 steps/needs minimal assist    Standing Unsupported, One Foot in [employer] balance while stepping or standing    Standing on One Leg  Tries to lift leg/unable to hold 3 seconds but remains standing independently    Total Score  36                   [facility] Adult PT Treatment/Exercise - 01/16/19 0001      Lumbar Exercises: Aerobic   Nustep  level 3 x 6 minutes      Lumbar Exercises: Machines for Strengthening   Cybex Knee Extension  15# 3x10    Cybex Knee Flexion  25# 3x10      Knee/Hip Exercises: Standing   Forward Step Up  Both;Hand Hold: 1;5 reps;1 set;Step Height: 6"   CGA              PT Short Term Goals - 08/25/18 1153      PT SHORT TERM GOAL #1   Title  independent with her home HEP    Status  Achieved        PT Long Term Goals - 01/16/19 1501      PT LONG TERM GOAL #1   Title  decrease TUG time to 22 seconds for functional gait and safety    Status  Partially Met      PT LONG TERM GOAL #2   Title  increase Berg balance score to 40/56 for safety    Status  Partially Met   36/56     PT LONG TERM GOAL #3   Title  increase LE strength (particularly ABD and ext.) to 4/5 for functional gait and safety    Status  Partially Met      PT LONG TERM GOAL #4   Title  be able to do a sit to stand without assistance and without losing balance for functional use    Status  Partially Met            Plan - 01/16/19 1457    Clinical Impression Statement  Pt has progressed increasing her BERG balance score by 10 points. Although she   increased her score she still remains a high fall risk. Pt has the most difficulty with alt  box taps, and narrow base of support standing. She demo ed good LE strength with seated leg extension. Cues to lean forward with step ups.    Stability/Clinical Decision Making  Stable/Uncomplicated    Rehab Potential  Good    PT Frequency  2x / week    PT Next Visit Plan  resume balance and strength activities to help her function safely.        Patient will benefit from skilled therapeutic intervention in order to improve the following deficits and impairments:  Abnormal gait, Pain, Postural dysfunction, Increased muscle spasms, Cardiopulmonary status limiting activity, Decreased activity tolerance, Decreased strength, Difficulty walking, Decreased balance  Visit Diagnosis: Repeated falls  Balance problem  Muscle weakness (generalized)     Problem List Patient Active Problem List   Diagnosis Date Noted  . Familial hypercholesteremia 02/21/2018  . Psychogenic gait 10/21/2017  . Coronary artery disease involving native coronary artery of native heart without angina pectoris 06/09/2017  . Abnormal nuclear cardiac imaging test 05/25/2017  . PVC's (premature ventricular contractions) 05/17/2017  . Anxiety 12/29/2016  . Vascular parkinsonism ([place]) 06/29/2016  . Gait disorder 04/07/2016  . Intracranial carotid stenosis, bilateral 02/27/2016  . Essential hypertension   . Left-sided weakness 12/28/2015  . Headache 12/28/2015  . Adjustment reaction with anxiety 12/20/2015  . Left hemiparesis ([place])   . History of CVA with residual deficit   . Prediabetes   . SVT (supraventricular tachycardia) ([place]) 12/14/2015  . Stroke-like symptom 12/13/2015  . History of stroke   . Heart palpitations 09/06/2012  . Hypertension   . Familial hyperlipidemia   . Hypothyroidism   . Vitamin D deficiency   . Palpitations   . History of dizziness   . LVH (left ventricular hypertrophy)   . Aortic stenosis    . Mitral regurgitation   . SOB (shortness of breath)   . Difficulty walking     Scot Jun, PTA 01/16/2019, 3:04 PM  [place], [place], [address] Phone: [phone]   Fax:  [phone]  Name: Stacey Ramirez MRN: [id number] Date of Birth: 09/15/1940

## 2019-01-19 ENCOUNTER — Ambulatory Visit: Payer: PPO | Admitting: Physical Therapy

## 2019-01-19 ENCOUNTER — Encounter: Payer: Self-pay | Admitting: Physical Therapy

## 2019-01-19 ENCOUNTER — Other Ambulatory Visit: Payer: Self-pay

## 2019-01-19 DIAGNOSIS — M6281 Muscle weakness (generalized): Secondary | ICD-10-CM

## 2019-01-19 DIAGNOSIS — R262 Difficulty in walking, not elsewhere classified: Secondary | ICD-10-CM | POA: Diagnosis not present

## 2019-01-19 DIAGNOSIS — R296 Repeated falls: Secondary | ICD-10-CM

## 2019-01-19 DIAGNOSIS — R2689 Other abnormalities of gait and mobility: Secondary | ICD-10-CM

## 2019-01-19 NOTE — Therapy (Addendum)
[address] [place], [place], [address] Phone: [phone]   Fax:  [phone] Progress Note Reporting Period 12/12/18 to 01/19/19 for the first 10 visits  See note below for Objective Data and Assessment of Progress/Goals.      Physical Therapy Treatment  Patient Details  Name: Stacey Ramirez MRN: [id number] Date of Birth: 01-11-1941 Referring Provider (PT): Pharr   Encounter Date: 01/19/2019  PT End of Session - 01/19/19 6433    Visit Number  10    Date for PT Re-Evaluation  02/11/19    Authorization - Visit Number  1400    Authorization - Number of Visits  [id number]    Activity Tolerance  Patient tolerated treatment well    Behavior During Therapy  [facility] for tasks assessed/performed       Past Medical History:  Diagnosis Date  . Adjustment reaction with anxiety 12/20/2015  . Aortic stenosis   . CAD (coronary artery disease) 06/09/2017   Nuc study 10/18: EF 69, inferolateral perfusion defect-possible small infarct with peri-infarct ischemia; intermediate risk // LHC 10/18: pLAD 25, pLCx 50, pRCA 25 >> med Rx  . Carotid stenosis, asymptomatic, bilateral 02/27/2016   Carotid US 5/17: Bilateral ICA 1-39  . Cerebrovascular accident (stroke) ([place])    a. 12/2015 - cryptogenic.  S/P MDT Linq.  . Difficulty walking   . Essential hypertension   . Gait disorder 04/07/2016  . Heart palpitations 09/06/2012  . History of CVA with residual deficit 12/14/2015  . History of dizziness   . History of echocardiogram    Echo 10/18: Vigorous LVF, EF 65-70, normal wall motion, grade 1 diastolic dysfunction, GLS -21.1%, mild RAE  . History of nuclear stress test    Nuclear stress test 10/18: EF 69, inf-lat defect c/w poss infarct with peri-infarct ischemia; Intermediate Risk  . Hyperlipidemia   . Hypothyroidism   . Left hemiparesis ([place])   . LVH (left ventricular hypertrophy)   . Mitral regurgitation    a. 12/2015 Echo: EF  60-65%, mild focal basal hypertrophy. No rwma, triv AI, mild MR, mildly dil LA w/o thrombus. No RA thrombus. No PFO.  Marland Kitchen Palpitations   . Prediabetes   . Stroke-like symptom 12/13/2015  . SVT (supraventricular tachycardia) ([place]) 12/14/2015  . Vascular parkinsonism ([place]) 06/29/2016  . Vitamin D deficiency     Past Surgical History:  Procedure Laterality Date  . CARDIOVASCULAR STRESS TEST  2002   NORMAL  . EP IMPLANTABLE DEVICE N/A 12/17/2015   Procedure: Loop Recorder Insertion;  Surgeon: [name], MD;  Location: [place] [facility];  Service: Cardiovascular;  Laterality: N/A;  . LEFT HEART CATH AND CORONARY ANGIOGRAPHY N/A 05/25/2017   Procedure: LEFT HEART CATH AND CORONARY ANGIOGRAPHY;  Surgeon: Sherren Mocha, MD;  Location: [place] [facility];  Service: Cardiovascular;  Laterality: N/A;  . LOOP RECORDER IMPLANT    . TEE WITHOUT CARDIOVERSION N/A 12/17/2015   Procedure: TRANSESOPHAGEAL ECHOCARDIOGRAM (TEE);  Surgeon: Lelon Perla, MD;  [facility]: [facility] ENDOSCOPY;  Service: Cardiovascular;  Laterality: N/A;  Pt also needs a LOOP  . TRANSTHORACIC ECHOCARDIOGRAM  2008   SHOWED LEFT VENTRICULAR HYPERTROPHY AND MILD AORTIC STENOSIS    There were no vitals filed for this visit.  Subjective Assessment - 01/19/19 1401    Subjective  "A little tired like always"    Currently in Pain?  No/denies  [facility] Adult PT Treatment/Exercise - 01/19/19 0001      High Level Balance   High Level Balance Activities  Side stepping;Backward walking;Negotiating over obstacles   forward and side step over dowel rod and tband    High Level Balance Comments  Constant cues to take bigger steps,       Lumbar Exercises: Aerobic   Tread Mill  12mh 3 minutes, a lot of cues to pick up the left foot    Nustep  level 3 x 6 minutes      Lumbar Exercises: Machines for Strengthening   Cybex Knee Extension  LLE 5lb 2x10     Cybex Knee Flexion  25# x15    Leg Press  30# 3x10                PT Short Term Goals - 08/25/18 1153      PT SHORT TERM GOAL #1   Title  independent with her home HEP    Status  Achieved        PT Long Term Goals - 01/16/19 1501      PT LONG TERM GOAL #1   Title  decrease TUG time to 22 seconds for functional gait and safety    Status  Partially Met      PT LONG TERM GOAL #2   Title  increase Berg balance score to 40/56 for safety    Status  Partially Met   36/56     PT LONG TERM GOAL #3   Title  increase LE strength (particularly ABD and ext.) to 4/5 for functional gait and safety    Status  Partially Met      PT LONG TERM GOAL #4   Title  be able to do a sit to stand without assistance and without losing balance for functional use    Status  Partially Met            Plan - 01/19/19 1447    Clinical Impression Statement  Pt did better today's stepping over objects. Constant cues to take bigger steps throughout session with all balance activities and ambulating around clinic. LLE did fatigue quick with SL strengthening. Pt is very hyper verbal often time getting distracted, pt does really well when she focuses on the activity.    Stability/Clinical Decision Making  Stable/Uncomplicated    Rehab Potential  Good    PT Frequency  2x / week    PT Treatment/Interventions  ADLs/Self Care Home Management;Electrical Stimulation;Moist Heat;Gait training;Stair training;Therapeutic activities;Functional mobility training;Therapeutic exercise;Patient/family education;Neuromuscular re-education;Balance training    PT Next Visit Plan  resume balance and strength activities to help her function safely.        Patient will benefit from skilled therapeutic intervention in order to improve the following deficits and impairments:  Abnormal gait, Pain, Postural dysfunction, Increased muscle spasms, Cardiopulmonary status limiting activity, Decreased activity tolerance, Decreased strength, Difficulty walking, Decreased  balance  Visit Diagnosis: 1. Balance problem   2. Difficulty in walking, not elsewhere classified   3. Muscle weakness (generalized)   4. Repeated falls        Problem List Patient Active Problem List   Diagnosis Date Noted  . Familial hypercholesteremia 02/21/2018  . Psychogenic gait 10/21/2017  . Coronary artery disease involving native coronary artery of native heart without angina pectoris 06/09/2017  . Abnormal nuclear cardiac imaging test 05/25/2017  . PVC's (premature ventricular contractions) 05/17/2017  . Anxiety 12/29/2016  . Vascular parkinsonism ([place] 06/29/2016  .  Gait disorder 04/07/2016  . Intracranial carotid stenosis, bilateral 02/27/2016  . Essential hypertension   . Left-sided weakness 12/28/2015  . Headache 12/28/2015  . Adjustment reaction with anxiety 12/20/2015  . Left hemiparesis ([place])   . History of CVA with residual deficit   . Prediabetes   . SVT (supraventricular tachycardia) ([place]) 12/14/2015  . Stroke-like symptom 12/13/2015  . History of stroke   . Heart palpitations 09/06/2012  . Hypertension   . Familial hyperlipidemia   . Hypothyroidism   . Vitamin D deficiency   . Palpitations   . History of dizziness   . LVH (left ventricular hypertrophy)   . Aortic stenosis   . Mitral regurgitation   . SOB (shortness of breath)   . Difficulty walking     Scot Jun, PTA 01/19/2019, 2:50 PM  [address] [place], [place], [address] Phone: [phone]   Fax:  [phone]  Name: Stacey Ramirez MRN: [id number] Date of Birth: March 11, 1941

## 2019-01-23 ENCOUNTER — Ambulatory Visit: Payer: PPO | Admitting: Physical Therapy

## 2019-01-23 NOTE — Progress Notes (Signed)
Carelink Summary Report / Loop Recorder 

## 2019-01-26 ENCOUNTER — Ambulatory Visit: Payer: PPO | Admitting: Physical Therapy

## 2019-01-26 DIAGNOSIS — R0602 Shortness of breath: Secondary | ICD-10-CM | POA: Diagnosis not present

## 2019-01-26 DIAGNOSIS — E782 Mixed hyperlipidemia: Secondary | ICD-10-CM | POA: Diagnosis not present

## 2019-01-26 DIAGNOSIS — R0609 Other forms of dyspnea: Secondary | ICD-10-CM | POA: Diagnosis not present

## 2019-01-26 DIAGNOSIS — R5382 Chronic fatigue, unspecified: Secondary | ICD-10-CM | POA: Diagnosis not present

## 2019-01-26 DIAGNOSIS — R600 Localized edema: Secondary | ICD-10-CM | POA: Diagnosis not present

## 2019-01-30 ENCOUNTER — Other Ambulatory Visit: Payer: Self-pay

## 2019-01-30 ENCOUNTER — Ambulatory Visit: Payer: PPO | Admitting: Physical Therapy

## 2019-01-30 ENCOUNTER — Encounter: Payer: Self-pay | Admitting: Physical Therapy

## 2019-01-30 DIAGNOSIS — R2689 Other abnormalities of gait and mobility: Secondary | ICD-10-CM

## 2019-01-30 DIAGNOSIS — R262 Difficulty in walking, not elsewhere classified: Secondary | ICD-10-CM | POA: Diagnosis not present

## 2019-01-30 DIAGNOSIS — R296 Repeated falls: Secondary | ICD-10-CM

## 2019-01-30 DIAGNOSIS — M6281 Muscle weakness (generalized): Secondary | ICD-10-CM

## 2019-01-30 NOTE — Therapy (Signed)
[address] [place], [place], [address] Phone: [phone]   Fax:  [phone]  Physical Therapy Treatment  Patient Details  Name: Stacey Ramirez MRN: [id number] Date of Birth: [birth year] Referring Provider (PT): Pharr   Encounter Date: 01/30/2019  PT End of Session - 01/30/19 1348    Visit Number  11    Number of Visits  16    Date for PT Re-Evaluation  02/11/19    PT Start Time  1300    PT Stop Time  1346    PT Time Calculation (min)  46 min    Activity Tolerance  Patient tolerated treatment well    Behavior During Therapy  [facility] for tasks assessed/performed       Past Medical History:  Diagnosis Date  . Adjustment reaction with anxiety 12/20/2015  . Aortic stenosis   . CAD (coronary artery disease) 06/09/2017   Nuc study 10/18: EF 69, inferolateral perfusion defect-possible small infarct with peri-infarct ischemia; intermediate risk // LHC 10/18: pLAD 25, pLCx 50, pRCA 25 >> med Rx  . Carotid stenosis, asymptomatic, bilateral 02/27/2016   Carotid US 5/17: Bilateral ICA 1-39  . Cerebrovascular accident (stroke) ([place])    a. 12/2015 - cryptogenic.  S/P MDT Linq.  . Difficulty walking   . Essential hypertension   . Gait disorder 04/07/2016  . Heart palpitations 09/06/2012  . History of CVA with residual deficit 12/14/2015  . History of dizziness   . History of echocardiogram    Echo 10/18: Vigorous LVF, EF 65-70, normal wall motion, grade 1 diastolic dysfunction, GLS -21.1%, mild RAE  . History of nuclear stress test    Nuclear stress test 10/18: EF 69, inf-lat defect c/w poss infarct with peri-infarct ischemia; Intermediate Risk  . Hyperlipidemia   . Hypothyroidism   . Left hemiparesis ([place])   . LVH (left ventricular hypertrophy)   . Mitral regurgitation    a. 12/2015 Echo: EF 60-65%, mild focal basal hypertrophy. No rwma, triv AI, mild MR, mildly dil LA w/o thrombus. No RA thrombus. No PFO.  Marland Kitchen  Palpitations   . Prediabetes   . Stroke-like symptom 12/13/2015  . SVT (supraventricular tachycardia) ([place]) 12/14/2015  . 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 Vitamin D deficiency     Past Surgical History:  Procedure Laterality Date  . CARDIOVASCULAR STRESS TEST  2002   NORMAL  . EP IMPLANTABLE DEVICE N/A 12/17/2015   Procedure: Loop Recorder Insertion;  Surgeon: [name], MD;  Location: [place] [facility];  Service: Cardiovascular;  Laterality: N/A;  . LEFT HEART CATH AND CORONARY ANGIOGRAPHY N/A 05/25/2017   Procedure: LEFT HEART CATH AND CORONARY ANGIOGRAPHY;  Surgeon: Sherren Mocha, MD;  Location: [place] [facility];  Service: Cardiovascular;  Laterality: N/A;  . LOOP RECORDER IMPLANT    . TEE WITHOUT CARDIOVERSION N/A 12/17/2015   Procedure: TRANSESOPHAGEAL ECHOCARDIOGRAM (TEE);  Surgeon: Lelon Perla, MD;  Location: [facility] ENDOSCOPY;  Service: Cardiovascular;  Laterality: N/A;  Pt also needs a LOOP  . TRANSTHORACIC ECHOCARDIOGRAM  2008   SHOWED LEFT VENTRICULAR HYPERTROPHY AND MILD AORTIC STENOSIS    There were no vitals filed for this visit.  Subjective Assessment - 01/30/19 1308    Subjective  "Im just feeling tired, don't wok me too hard"    Patient is accompanied by:  Family member    Pertinent History  Parkinson's, multiple falls    Limitations  Walking    Patient Stated Goals  decrease the falls, feel stronger    Currently in Pain?  No/denies                       [facility] Adult PT Treatment/Exercise - 01/30/19 0001      High Level Balance   High Level Balance Activities  Side stepping    High Level Balance Comments  SBA cues to keep hips square      Lumbar Exercises: Aerobic   Tread Mill  36mh 3 minutes, a lot of cues to pick up the left foot    Nustep  level 3 x 6 minutes      Lumbar Exercises: Machines for Strengthening   Cybex Knee Extension  15lb 2x10, LLE 5lb x10     Cybex Knee Flexion  25# 2x15, LLE 15lb x10     Leg Press  30#  3x10      Knee/Hip Exercises: Standing   Other Standing Knee Exercises  Alt 4in taps with SPC 2x10   Cues to slow down and controll LLE    Other Standing Knee Exercises  Standing march with RW 2lb cuff with 2 second hold at the top 2x5               PT Short Term Goals - 08/25/18 1153      PT SHORT TERM GOAL #1   Title  independent with her home HEP    Status  Achieved        PT Long Term Goals - 01/16/19 1501      PT LONG TERM GOAL #1   Title  decrease TUG time to 22 seconds for functional gait and safety    Status  Partially Met      PT LONG TERM GOAL #2   Title  increase Berg balance score to 40/56 for safety    Status  Partially Met   36/56     PT LONG TERM GOAL #3   Title  increase LE strength (particularly ABD and ext.) to 4/5 for functional gait and safety    Status  Partially Met      PT LONG TERM GOAL #4   Title  be able to do a sit to stand without assistance and without losing balance for functional use    Status  Partially Met            Plan - 01/30/19 1349    Clinical Impression Statement  Pt did really well today's with today's exercises. LLE did fatigue when isolated on machines. Ambulated around clinic without AD, increase time needed to take the initial sep once standing. Cues needed to slow down during alt box taps with LLE and to bring it straight back instead of off to the side.    Rehab Potential  Good    PT Frequency  2x / week    PT Next Visit Plan  resume balance and strength activities to help her function safely.        Patient will benefit from skilled therapeutic intervention in order to improve the following deficits and impairments:  Abnormal gait, Pain, Postural dysfunction, Increased muscle spasms, Cardiopulmonary status limiting activity, Decreased activity tolerance, Decreased strength, Difficulty walking, Decreased balance  Visit Diagnosis: 1. Repeated falls   2. Muscle weakness (generalized)   3. Difficulty in  walking, not elsewhere classified   4. Balance problem        Problem List Patient Active Problem List   Diagnosis Date Noted  .  Familial hypercholesteremia 02/21/2018  . Psychogenic gait 10/21/2017  . Coronary artery disease involving native coronary artery of native heart without angina pectoris 06/09/2017  . Abnormal nuclear cardiac imaging test 05/25/2017  . PVC's (premature ventricular contractions) 05/17/2017  . Anxiety 12/29/2016  . 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 Gait disorder 04/07/2016  . Intracranial carotid stenosis, bilateral 02/27/2016  . Essential hypertension   . Left-sided weakness 12/28/2015  . Headache 12/28/2015  . Adjustment reaction with anxiety 12/20/2015  . Left hemiparesis ([place])   . History of CVA with residual deficit   . Prediabetes   . SVT (supraventricular tachycardia) ([place]) 12/14/2015  . Stroke-like symptom 12/13/2015  . History of stroke   . Heart palpitations 09/06/2012  . Hypertension   . Familial hyperlipidemia   . Hypothyroidism   . Vitamin D deficiency   . Palpitations   . History of dizziness   . LVH (left ventricular hypertrophy)   . Aortic stenosis   . Mitral regurgitation   . SOB (shortness of breath)   . Difficulty walking     Scot Jun, PTA 01/30/2019, 1:51 PM  [address], [place], [address] Phone: [phone]   Fax:  [phone]  Name: NAYELI CALVERT MRN: [id number] Date of Birth: Aug 20, 1940

## 2019-02-02 ENCOUNTER — Other Ambulatory Visit: Payer: Self-pay

## 2019-02-02 ENCOUNTER — Ambulatory Visit: Payer: PPO | Attending: Specialist | Admitting: Physical Therapy

## 2019-02-02 DIAGNOSIS — R296 Repeated falls: Secondary | ICD-10-CM | POA: Diagnosis not present

## 2019-02-02 DIAGNOSIS — R262 Difficulty in walking, not elsewhere classified: Secondary | ICD-10-CM | POA: Diagnosis not present

## 2019-02-02 DIAGNOSIS — M6281 Muscle weakness (generalized): Secondary | ICD-10-CM | POA: Insufficient documentation

## 2019-02-02 DIAGNOSIS — R2689 Other abnormalities of gait and mobility: Secondary | ICD-10-CM | POA: Diagnosis not present

## 2019-02-02 NOTE — Therapy (Signed)
[address] [place], [place], [address] Phone: [phone]   Fax:  [phone]  Physical Therapy Treatment  Patient Details  Name: Stacey Ramirez MRN: [id number] Date of Birth: 05-18-1941 Referring Provider (PT): Pharr   Encounter Date: 02/02/2019  PT End of Session - 02/02/19 1455    Visit Number  12    Date for PT Re-Evaluation  02/11/19    PT Start Time  1400    PT Stop Time  1452    PT Time Calculation (min)  52 min       Past Medical History:  Diagnosis Date  . Adjustment reaction with anxiety 12/20/2015  . Aortic stenosis   . CAD (coronary artery disease) 06/09/2017   Nuc study 10/18: EF 69, inferolateral perfusion defect-possible small infarct with peri-infarct ischemia; intermediate risk // LHC 10/18: pLAD 25, pLCx 50, pRCA 25 >> med Rx  . Carotid stenosis, asymptomatic, bilateral 02/27/2016   Carotid US 5/17: Bilateral ICA 1-39  . Cerebrovascular accident (stroke) ([place])    a. 12/2015 - cryptogenic.  S/P MDT Linq.  . Difficulty walking   . Essential hypertension   . Gait disorder 04/07/2016  . Heart palpitations 09/06/2012  . History of CVA with residual deficit 12/14/2015  . History of dizziness   . History of echocardiogram    Echo 10/18: Vigorous LVF, EF 65-70, normal wall motion, grade 1 diastolic dysfunction, GLS -21.1%, mild RAE  . History of nuclear stress test    Nuclear stress test 10/18: EF 69, inf-lat defect c/w poss infarct with peri-infarct ischemia; Intermediate Risk  . Hyperlipidemia   . Hypothyroidism   . Left hemiparesis ([place])   . LVH (left ventricular hypertrophy)   . Mitral regurgitation    a. 12/2015 Echo: EF 60-65%, mild focal basal hypertrophy. No rwma, triv AI, mild MR, mildly dil LA w/o thrombus. No RA thrombus. No PFO.  Marland Kitchen Palpitations   . Prediabetes   . Stroke-like symptom 12/13/2015  . SVT (supraventricular tachycardia) ([place]) 12/14/2015  . Vascular parkinsonism ([place])  06/29/2016  . Vitamin D deficiency     Past Surgical History:  Procedure Laterality Date  . CARDIOVASCULAR STRESS TEST  2002   NORMAL  . EP IMPLANTABLE DEVICE N/A 12/17/2015   Procedure: Loop Recorder Insertion;  Surgeon: [name], MD;  Location: [place] CV LAB;  Service: Cardiovascular;  Laterality: N/A;  . LEFT HEART CATH AND CORONARY ANGIOGRAPHY N/A 05/25/2017   Procedure: LEFT HEART CATH AND CORONARY ANGIOGRAPHY;  Surgeon: Sherren Mocha, MD;  Location: [place] [facility];  Service: Cardiovascular;  Laterality: N/A;  . LOOP RECORDER IMPLANT    . TEE WITHOUT CARDIOVERSION N/A 12/17/2015   Procedure: TRANSESOPHAGEAL ECHOCARDIOGRAM (TEE);  Surgeon: Lelon Perla, MD;  Location: [facility] ENDOSCOPY;  Service: Cardiovascular;  Laterality: N/A;  Pt also needs a LOOP  . TRANSTHORACIC ECHOCARDIOGRAM  2008   SHOWED LEFT VENTRICULAR HYPERTROPHY AND MILD AORTIC STENOSIS    There were no vitals filed for this visit.  Subjective Assessment - 02/02/19 1359    Subjective  doing okay    Currently in Pain?  No/denies                       [facility] Adult PT Treatment/Exercise - 02/02/19 0001      Ambulation/Gait   Gait Comments  amb without AD working on stride and cadeance, close SBA, occassion HHA and max cuing. pt losses balance when  looking up, neg turns and/or coming on contact with people      High Level Balance   High Level Balance Activities  Negotitating around obstacles;Negotiating over obstacles    High Level Balance Comments  hip kicks 3 way and marching HHA on airex. static and dynamic balance on airex      Lumbar Exercises: Aerobic   Tread Mill  62mh 4 minutes,  cues to pick up the left foot   working on cadeance   Nustep  L 4 6 min      Lumbar Exercises: Machines for Strengthening   Cybex Knee Extension  15lb 2x10, LLE 5lb x10     Cybex Knee Flexion  25# 2x15, LLE 15lb x10       Knee/Hip Exercises: Standing   Other Standing Knee Exercises  Alt 4in taps  with HHA 2 sets 10   then 5 each leg working on clearing     Knee/Hip Exercises: Seated   Sit to [employer] 2 sets;without UE support   airex on mat               PT Short Term Goals - 08/25/18 1153      PT SHORT TERM GOAL #1   Title  independent with her home HEP    Status  Achieved        PT Long Term Goals - 01/16/19 1501      PT LONG TERM GOAL #1   Title  decrease TUG time to 22 seconds for functional gait and safety    Status  Partially Met      PT LONG TERM GOAL #2   Title  increase Berg balance score to 40/56 for safety    Status  Partially Met   36/56     PT LONG TERM GOAL #3   Title  increase LE strength (particularly ABD and ext.) to 4/5 for functional gait and safety    Status  Partially Met      PT LONG TERM GOAL #4   Title  be able to do a sit to stand without assistance and without losing balance for functional use    Status  Partially Met            Plan - 02/02/19 1456    Clinical Impression Statement  focus session on balance and gait with cuing for foot clearance esp left.mod cuing needed and min A. pt looses balance posterior esp when looking up, turns and coming into contact with others.  LLE noteably weaker than RT    PT Treatment/Interventions  ADLs/Self Care Home Management;Electrical Stimulation;Moist Heat;Gait training;Stair training;Therapeutic activities;Functional mobility training;Therapeutic exercise;Patient/family education;Neuromuscular re-education;Balance training    PT Next Visit Plan  balance and strength activities to help her function safely. Gait progression       Patient will benefit from skilled therapeutic intervention in order to improve the following deficits and impairments:  Abnormal gait, Pain, Postural dysfunction, Increased muscle spasms, Cardiopulmonary status limiting activity, Decreased activity tolerance, Decreased strength, Difficulty walking, Decreased balance  Visit Diagnosis: 1. Repeated falls   2.  Muscle weakness (generalized)   3. Difficulty in walking, not elsewhere classified   4. Balance problem        Problem List Patient Active Problem List   Diagnosis Date Noted  . Familial hypercholesteremia 02/21/2018  . Psychogenic gait 10/21/2017  . Coronary artery disease involving native coronary artery of native heart without angina pectoris 06/09/2017  . Abnormal nuclear cardiac imaging test 05/25/2017  .  PVC's (premature ventricular contractions) 05/17/2017  . Anxiety 12/29/2016  . 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 Gait disorder 04/07/2016  . Intracranial carotid stenosis, bilateral 02/27/2016  . Essential hypertension   . Left-sided weakness 12/28/2015  . Headache 12/28/2015  . Adjustment reaction with anxiety 12/20/2015  . Left hemiparesis ([place])   . History of CVA with residual deficit   . Prediabetes   . SVT (supraventricular tachycardia) ([place]) 12/14/2015  . Stroke-like symptom 12/13/2015  . History of stroke   . Heart palpitations 09/06/2012  . Hypertension   . Familial hyperlipidemia   . Hypothyroidism   . Vitamin D deficiency   . Palpitations   . History of dizziness   . LVH (left ventricular hypertrophy)   . Aortic stenosis   . Mitral regurgitation   . SOB (shortness of breath)   . Difficulty walking     Diani Jillson,ANGIE PTA 02/02/2019, 2:58 PM  [place], [place], [address] Phone: [phone]   Fax:  [phone]  Name: SHERISSA TENENBAUM MRN: [id number] Date of Birth: 05-Sep-1940

## 2019-02-07 ENCOUNTER — Ambulatory Visit: Payer: PPO | Admitting: Physical Therapy

## 2019-02-07 ENCOUNTER — Other Ambulatory Visit: Payer: Self-pay

## 2019-02-07 DIAGNOSIS — R296 Repeated falls: Secondary | ICD-10-CM

## 2019-02-07 DIAGNOSIS — R2689 Other abnormalities of gait and mobility: Secondary | ICD-10-CM

## 2019-02-07 DIAGNOSIS — R262 Difficulty in walking, not elsewhere classified: Secondary | ICD-10-CM

## 2019-02-07 DIAGNOSIS — M6281 Muscle weakness (generalized): Secondary | ICD-10-CM

## 2019-02-07 NOTE — Therapy (Signed)
[place], [place], [address] Phone: [phone]   Fax:  [phone]  Physical Therapy Treatment  Patient Details  Name: Stacey Ramirez MRN: [id number] Date of Birth: 1940-11-12 Referring Provider (PT): Pharr   Encounter Date: 02/07/2019  PT End of Session - 02/07/19 1402    Visit Number  13    Number of Visits  16    Date for PT Re-Evaluation  02/11/19    PT Start Time  1315    PT Stop Time  1400    PT Time Calculation (min)  45 min       Past Medical History:  Diagnosis Date  . Adjustment reaction with anxiety 12/20/2015  . Aortic stenosis   . CAD (coronary artery disease) 06/09/2017   Nuc study 10/18: EF 69, inferolateral perfusion defect-possible small infarct with peri-infarct ischemia; intermediate risk // LHC 10/18: pLAD 25, pLCx 50, pRCA 25 >> med Rx  . Carotid stenosis, asymptomatic, bilateral 02/27/2016   Carotid US 5/17: Bilateral ICA 1-39  . Cerebrovascular accident (stroke) ([place])    a. 12/2015 - cryptogenic.  S/P MDT Linq.  . Difficulty walking   . Essential hypertension   . Gait disorder 04/07/2016  . Heart palpitations 09/06/2012  . History of CVA with residual deficit 12/14/2015  . History of dizziness   . History of echocardiogram    Echo 10/18: Vigorous LVF, EF 65-70, normal wall motion, grade 1 diastolic dysfunction, GLS -21.1%, mild RAE  . History of nuclear stress test    Nuclear stress test 10/18: EF 69, inf-lat defect c/w poss infarct with peri-infarct ischemia; Intermediate Risk  . Hyperlipidemia   . Hypothyroidism   . Left hemiparesis ([place])   . LVH (left ventricular hypertrophy)   . Mitral regurgitation    a. 12/2015 Echo: EF 60-65%, mild focal basal hypertrophy. No rwma, triv AI, mild MR, mildly dil LA w/o thrombus. No RA thrombus. No PFO.  Marland Kitchen Palpitations   . Prediabetes   . Stroke-like symptom 12/13/2015  . SVT (supraventricular tachycardia) ([place]) 12/14/2015  .  Vascular parkinsonism ([place]) 06/29/2016  . Vitamin D deficiency     Past Surgical History:  Procedure Laterality Date  . CARDIOVASCULAR STRESS TEST  2002   NORMAL  . EP IMPLANTABLE DEVICE N/A 12/17/2015   Procedure: Loop Recorder Insertion;  Surgeon: [name], MD;  Location: [facility];  Service: Cardiovascular;  Laterality: N/A;  . LEFT HEART CATH AND CORONARY ANGIOGRAPHY N/A 05/25/2017   Procedure: LEFT HEART CATH AND CORONARY ANGIOGRAPHY;  Surgeon: Sherren Mocha, MD;  Location: [facility];  Service: Cardiovascular;  Laterality: N/A;  . LOOP RECORDER IMPLANT    . TEE WITHOUT CARDIOVERSION N/A 12/17/2015   Procedure: TRANSESOPHAGEAL ECHOCARDIOGRAM (TEE);  Surgeon: Lelon Perla, MD;  Location: [facility] ENDOSCOPY;  Service: Cardiovascular;  Laterality: N/A;  Pt also needs a LOOP  . TRANSTHORACIC ECHOCARDIOGRAM  2008   SHOWED LEFT VENTRICULAR HYPERTROPHY AND MILD AORTIC STENOSIS    There were no vitals filed for this visit.  Subjective Assessment - 02/07/19 1315    Subjective  sometimes I walk okay and other times not. not sure I am not sure this is working?    Currently in Pain?  No/denies                       [facility] Adult PT Treatment/Exercise - 02/07/19 0001      Ambulation/Gait  Gait Comments  amb with 2 canes to work reciprocal gait and increase speed and cadeance   worked on turns in various ways as this is still very diff.     Berg Balance Test   Sit to Stand  Able to stand  independently using hands    Standing Unsupported  Able to stand safely 2 minutes    Sitting with Back Unsupported but Feet Supported on Floor or Stool  Able to sit safely and securely 2 minutes    Stand to Sit  Controls descent by using hands    Transfers  Able to transfer safely, definite need of hands    Standing Unsupported with Eyes Closed  Able to stand 10 seconds with supervision    Standing Ubsupported with Feet Together  Needs help to attain position but able  to stand for 30 seconds with feet together    From Standing, Reach Forward with Outstretched Arm  Can reach forward >12 cm safely (5")    From Standing Position, Pick up Object from Floor  Able to pick up shoe, needs supervision    From Standing Position, Turn to Look Behind Over each Shoulder  Looks behind from both sides and weight shifts well    Turn 360 Degrees  Able to turn 360 degrees safely but slowly    Standing Unsupported, Alternately Place Feet on Step/Stool  Able to complete >2 steps/needs minimal assist    Standing Unsupported, One Foot in [employer] balance while stepping or standing    Standing on One Leg  Tries to lift leg/unable to hold 3 seconds but remains standing independently    Total Score  35      High Level Balance   High Level Balance Activities  Negotitating around obstacles;Negotiating over obstacles      Lumbar Exercises: Aerobic   Nustep  L 5 6 min      Lumbar Exercises: Machines for Strengthening   Cybex Knee Extension  15lb 2x10, LLE 5lb x10     Cybex Knee Flexion  25# 2x15, LLE 15lb x10     Leg Press  30# 3x10      Knee/Hip Exercises: Seated   Sit to Sand  2 sets;10 reps;with UE support;without UE support               PT Short Term Goals - 08/25/18 1153      PT SHORT TERM GOAL #1   Title  independent with her home HEP    Status  Achieved        PT Long Term Goals - 02/07/19 1332      PT LONG TERM GOAL #1   Status  Partially Met      PT LONG TERM GOAL #2   Title  increase Berg balance score to 40/56 for safety    Baseline  35/56    Status  Partially Met      PT LONG TERM GOAL #3   Title  increase LE strength (particularly ABD and ext.) to 4/5 for functional gait and safety    Baseline  4/5 hips and knees tested in sitting except BIL hip flexors 4-/5    Status  Partially Met      PT LONG TERM GOAL #4   Title  be able to do a sit to stand without assistance and without losing balance for functional use    Status  Partially  Met  Plan - 02/07/19 1403    Clinical Impression Statement  BERG 35/56. TUG 26.2. Both maintaining. MMT 4/5 except hip flexors 4-/5. Encourgaed pt to do more at [employer] non therapy days. Slow progression. Pt very tired today    PT Treatment/Interventions  ADLs/Self Care Home Management;Electrical Stimulation;Moist Heat;Gait training;Stair training;Therapeutic activities;Functional mobility training;Therapeutic exercise;Patient/family education;Neuromuscular re-education;Balance training    PT Next Visit Plan  balance and strength activities to help her function safely. Gait progression       Patient will benefit from skilled therapeutic intervention in order to improve the following deficits and impairments:  Abnormal gait, Pain, Postural dysfunction, Increased muscle spasms, Cardiopulmonary status limiting activity, Decreased activity tolerance, Decreased strength, Difficulty walking, Decreased balance  Visit Diagnosis: 1. Muscle weakness (generalized)   2. Repeated falls   3. Difficulty in walking, not elsewhere classified   4. Balance problem        Problem List Patient Active Problem List   Diagnosis Date Noted  . Familial hypercholesteremia 02/21/2018  . Psychogenic gait 10/21/2017  . Coronary artery disease involving native coronary artery of native heart without angina pectoris 06/09/2017  . Abnormal nuclear cardiac imaging test 05/25/2017  . PVC's (premature ventricular contractions) 05/17/2017  . Anxiety 12/29/2016  . Vascular parkinsonism ([place]) 06/29/2016  . Gait disorder 04/07/2016  . Intracranial carotid stenosis, bilateral 02/27/2016  . Essential hypertension   . Left-sided weakness 12/28/2015  . Headache 12/28/2015  . Adjustment reaction with anxiety 12/20/2015  . Left hemiparesis ([place])   . History of CVA with residual deficit   . Prediabetes   . SVT (supraventricular tachycardia) ([place]) 12/14/2015  . Stroke-like symptom 12/13/2015  . History of  stroke   . Heart palpitations 09/06/2012  . Hypertension   . Familial hyperlipidemia   . Hypothyroidism   . Vitamin D deficiency   . Palpitations   . History of dizziness   . LVH (left ventricular hypertrophy)   . Aortic stenosis   . Mitral regurgitation   . SOB (shortness of breath)   . Difficulty walking     Zeferino Mounts,ANGIE PTA 02/07/2019, 2:04 PM  [place], [place], [address] Phone: [phone]   Fax:  [phone]  Name: ANI DEOLIVEIRA MRN: [id number] Date of Birth: Aug 13, 1940

## 2019-02-09 ENCOUNTER — Other Ambulatory Visit: Payer: Self-pay

## 2019-02-09 ENCOUNTER — Ambulatory Visit: Payer: PPO | Admitting: Physical Therapy

## 2019-02-09 DIAGNOSIS — R262 Difficulty in walking, not elsewhere classified: Secondary | ICD-10-CM

## 2019-02-09 DIAGNOSIS — M6281 Muscle weakness (generalized): Secondary | ICD-10-CM

## 2019-02-09 DIAGNOSIS — R269 Unspecified abnormalities of gait and mobility: Secondary | ICD-10-CM | POA: Diagnosis not present

## 2019-02-09 DIAGNOSIS — R2689 Other abnormalities of gait and mobility: Secondary | ICD-10-CM

## 2019-02-09 DIAGNOSIS — R296 Repeated falls: Secondary | ICD-10-CM

## 2019-02-09 DIAGNOSIS — G20 Parkinson's disease: Secondary | ICD-10-CM | POA: Diagnosis not present

## 2019-02-09 NOTE — Therapy (Signed)
[place], [place], [address] Phone: [phone]   Fax:  [phone]  Physical Therapy Treatment  Patient Details  Name: Stacey Ramirez MRN: [id number] Date of Birth: 07-17-1941 Referring Provider (PT): Pharr   Encounter Date: 02/09/2019  PT End of Session - 02/09/19 1543    Visit Number  14    Number of Visits  16    Date for PT Re-Evaluation  02/11/19    PT Start Time  1400    PT Stop Time  1455    PT Time Calculation (min)  55 min       Past Medical History:  Diagnosis Date  . Adjustment reaction with anxiety 12/20/2015  . Aortic stenosis   . CAD (coronary artery disease) 06/09/2017   Nuc study 10/18: EF 69, inferolateral perfusion defect-possible small infarct with peri-infarct ischemia; intermediate risk // LHC 10/18: pLAD 25, pLCx 50, pRCA 25 >> med Rx  . Carotid stenosis, asymptomatic, bilateral 02/27/2016   Carotid US 5/17: Bilateral ICA 1-39  . Cerebrovascular accident (stroke) ([place])    a. 12/2015 - cryptogenic.  S/P MDT Linq.  . Difficulty walking   . Essential hypertension   . Gait disorder 04/07/2016  . Heart palpitations 09/06/2012  . History of CVA with residual deficit 12/14/2015  . History of dizziness   . History of echocardiogram    Echo 10/18: Vigorous LVF, EF 65-70, normal wall motion, grade 1 diastolic dysfunction, GLS -21.1%, mild RAE  . History of nuclear stress test    Nuclear stress test 10/18: EF 69, inf-lat defect c/w poss infarct with peri-infarct ischemia; Intermediate Risk  . Hyperlipidemia   . Hypothyroidism   . Left hemiparesis ([place])   . LVH (left ventricular hypertrophy)   . Mitral regurgitation    a. 12/2015 Echo: EF 60-65%, mild focal basal hypertrophy. No rwma, triv AI, mild MR, mildly dil LA w/o thrombus. No RA thrombus. No PFO.  Marland Kitchen Palpitations   . Prediabetes   . Stroke-like symptom 12/13/2015  . SVT (supraventricular tachycardia) ([place]) 12/14/2015  .  Vascular parkinsonism ([place]) 06/29/2016  . Vitamin D deficiency     Past Surgical History:  Procedure Laterality Date  . CARDIOVASCULAR STRESS TEST  2002   NORMAL  . EP IMPLANTABLE DEVICE N/A 12/17/2015   Procedure: Loop Recorder Insertion;  Surgeon: [name], MD;  Location: [facility];  Service: Cardiovascular;  Laterality: N/A;  . LEFT HEART CATH AND CORONARY ANGIOGRAPHY N/A 05/25/2017   Procedure: LEFT HEART CATH AND CORONARY ANGIOGRAPHY;  Surgeon: Sherren Mocha, MD;  Location: [facility];  Service: Cardiovascular;  Laterality: N/A;  . LOOP RECORDER IMPLANT    . TEE WITHOUT CARDIOVERSION N/A 12/17/2015   Procedure: TRANSESOPHAGEAL ECHOCARDIOGRAM (TEE);  Surgeon: Lelon Perla, MD;  Location: [facility] END[facility]COPY;  Service: Cardiovascular;  Laterality: N/A;  Pt also needs a LOOP  . TRANSTHORACIC ECHOCARDIOGRAM  2008   SHOWED LEFT VENTRICULAR HYPERTROPHY AND MILD AORTIC STENOSIS    There were no vitals filed for this visit.  Subjective Assessment - 02/09/19 1426    Subjective  daughter arrived with pt- had a conversation with both about getting help in home for pt to stim mind and physically as well as respite for husband, verb will discuss with husband and get back with me for recommendations    Currently in Pain?  No/denies  [facility] Adult PT Treatment/Exercise - 02/09/19 0001      Ambulation/Gait   Gait Comments  amb HHA in and out of clinic working on stride and balance      Lumbar Exercises: Aerobic   Tread Mill  70mh 4 minutes,  cues to pick up the left foot    Nustep  L 5 6 min      Lumbar Exercises: Machines for Strengthening   Cybex Knee Extension  15lb 2x10, LLE 5lb x10     Cybex Knee Flexion  25# 2x15, LLE 15lb x10     Leg Press  30# 3x10      Lumbar Exercises: Standing   Other Standing Lumbar Exercises  HHA on airex 20 reps marching,hip flex,ext and abd      Knee/Hip Exercises: Machines for Strengthening   Cybex  Knee Extension  10# BIL 2 sets 10. 5# left 3 sets 5    Cybex Knee Flexion  15# left only 2 sets 10. BIL 25# 12 reps    Cybex Leg Press  30# 3 sets 10               PT Short Term Goals - 08/25/18 1153      PT SHORT TERM GOAL #1   Title  independent with her home HEP    Status  Achieved        PT Long Term Goals - 02/07/19 1332      PT LONG TERM GOAL #1   Status  Partially Met      PT LONG TERM GOAL #2   Title  increase Berg balance score to 40/56 for safety    Baseline  35/56    Status  Partially Met      PT LONG TERM GOAL #3   Title  increase LE strength (particularly ABD and ext.) to 4/5 for functional gait and safety    Baseline  4/5 hips and knees tested in sitting except BIL hip flexors 4-/5    Status  Partially Met      PT LONG TERM GOAL #4   Title  be able to do a sit to stand without assistance and without losing balance for functional use    Status  Partially Met            Plan - 02/09/19 1544    Clinical Impression Statement  PTA had long talk wih pt and daughter about getting some help in the home to make he more active and ex to give spouse a break. PTA encouraged resuming more normal activities ,despite making slow progress encouraged her to still do more vs waiting to get back to PLF as long as she is safe. All agrred.    PT Treatment/Interventions  ADLs/Self Care Home Management;Electrical Stimulation;Moist Heat;Gait training;Stair training;Therapeutic activities;Functional mobility training;Therapeutic exercise;Patient/family education;Neuromuscular re-education;Balance training    PT Next Visit Plan  balance and strength activities to help her function safely. Gait progression. Assess if they want rec for help in home       Patient will benefit from skilled therapeutic intervention in order to improve the following deficits and impairments:  Abnormal gait, Pain, Postural dysfunction, Increased muscle spasms, Cardiopulmonary status limiting  activity, Decreased activity tolerance, Decreased strength, Difficulty walking, Decreased balance  Visit Diagnosis: 1. Muscle weakness (generalized)   2. Repeated falls   3. Difficulty in walking, not elsewhere classified   4. Balance problem        Problem List Patient Active Problem List  Diagnosis Date Noted  . Familial hypercholesteremia 02/21/2018  . Psychogenic gait 10/21/2017  . Coronary artery disease involving native coronary artery of native heart without angina pectoris 06/09/2017  . Abnormal nuclear cardiac imaging test 05/25/2017  . PVC's (premature ventricular contractions) 05/17/2017  . Anxiety 12/29/2016  . Vascular parkinsonism ([place]) 06/29/2016  . Gait disorder 04/07/2016  . Intracranial carotid stenosis, bilateral 02/27/2016  . Essential hypertension   . Left-sided weakness 12/28/2015  . Headache 12/28/2015  . Adjustment reaction with anxiety 12/20/2015  . Left hemiparesis ([place])   . History of CVA with residual deficit   . Prediabetes   . SVT (supraventricular tachycardia) ([place]) 12/14/2015  . Stroke-like symptom 12/13/2015  . History of stroke   . Heart palpitations 09/06/2012  . Hypertension   . Familial hyperlipidemia   . Hypothyroidism   . Vitamin D deficiency   . Palpitations   . History of dizziness   . LVH (left ventricular hypertrophy)   . Aortic stenosis   . Mitral regurgitation   . SOB (shortness of breath)   . Difficulty walking     Sameer Teeple,ANGIE PTA 02/09/2019, 3:50 PM  [place], [place], [address] Phone: [phone]   Fax:  [phone]  Name: Stacey Ramirez MRN: [id number] Date of Birth: [birth year]

## 2019-02-14 ENCOUNTER — Ambulatory Visit: Payer: PPO | Admitting: Physical Therapy

## 2019-02-16 ENCOUNTER — Ambulatory Visit: Payer: PPO | Admitting: Physical Therapy

## 2019-02-16 ENCOUNTER — Other Ambulatory Visit: Payer: Self-pay

## 2019-02-16 DIAGNOSIS — M6281 Muscle weakness (generalized): Secondary | ICD-10-CM

## 2019-02-16 DIAGNOSIS — R296 Repeated falls: Secondary | ICD-10-CM | POA: Diagnosis not present

## 2019-02-16 DIAGNOSIS — R2689 Other abnormalities of gait and mobility: Secondary | ICD-10-CM

## 2019-02-16 DIAGNOSIS — R262 Difficulty in walking, not elsewhere classified: Secondary | ICD-10-CM

## 2019-02-16 NOTE — Therapy (Signed)
[place], [place], [address] Phone: [phone]   Fax:  [phone]  Physical Therapy Treatment  Patient Details  Name: Stacey Ramirez MRN: [id number] Date of Birth: 01/27/1941 Referring Provider (PT): Pharr   Encounter Date: 02/16/2019  PT End of Session - 02/16/19 1425    Visit Number  15    Authorization - Visit Number  [id number]    Authorization - Number of Visits  [id number]       Past Medical History:  Diagnosis Date  . Adjustment reaction with anxiety 12/20/2015  . Aortic stenosis   . CAD (coronary artery disease) 06/09/2017   Nuc study 10/18: EF 69, inferolateral perfusion defect-possible small infarct with peri-infarct ischemia; intermediate risk // LHC 10/18: pLAD 25, pLCx 50, pRCA 25 >> med Rx  . Carotid stenosis, asymptomatic, bilateral 02/27/2016   Carotid US 5/17: Bilateral ICA 1-39  . Cerebrovascular accident (stroke) ([place])    a. 12/2015 - cryptogenic.  S/P MDT Linq.  . Difficulty walking   . Essential hypertension   . Gait disorder 04/07/2016  . Heart palpitations 09/06/2012  . History of CVA with residual deficit 12/14/2015  . History of dizziness   . History of echocardiogram    Echo 10/18: Vigorous LVF, EF 65-70, normal wall motion, grade 1 diastolic dysfunction, GLS -21.1%, mild RAE  . History of nuclear stress test    Nuclear stress test 10/18: EF 69, inf-lat defect c/w poss infarct with peri-infarct ischemia; Intermediate Risk  . Hyperlipidemia   . Hypothyroidism   . Left hemiparesis ([place])   . LVH (left ventricular hypertrophy)   . Mitral regurgitation    a. 12/2015 Echo: EF 60-65%, mild focal basal hypertrophy. No rwma, triv AI, mild MR, mildly dil LA w/o thrombus. No RA thrombus. No PFO.  Marland Kitchen Palpitations   . Prediabetes   . Stroke-like symptom 12/13/2015  . SVT (supraventricular tachycardia) ([place]) 12/14/2015  . Vascular parkinsonism ([place]) 06/29/2016  . Vitamin D deficiency      Past Surgical History:  Procedure Laterality Date  . CARDIOVASCULAR STRESS TEST  2002   NORMAL  . EP IMPLANTABLE DEVICE N/A 12/17/2015   Procedure: Loop Recorder Insertion;  Surgeon: [name], MD;  Location: [facility];  Service: Cardiovascular;  Laterality: N/A;  . LEFT HEART CATH AND CORONARY ANGIOGRAPHY N/A 05/25/2017   Procedure: LEFT HEART CATH AND CORONARY ANGIOGRAPHY;  Surgeon: Sherren Mocha, MD;  Location: [facility];  Service: Cardiovascular;  Laterality: N/A;  . LOOP RECORDER IMPLANT    . TEE WITHOUT CARDIOVERSION N/A 12/17/2015   Procedure: TRANSESOPHAGEAL ECHOCARDIOGRAM (TEE);  Surgeon: Lelon Perla, MD;  Location: [facility] ENDOSCOPY;  Service: Cardiovascular;  Laterality: N/A;  Pt also needs a LOOP  . TRANSTHORACIC ECHOCARDIOGRAM  2008   SHOWED LEFT VENTRICULAR HYPERTROPHY AND MILD AORTIC STENOSIS    There were no vitals filed for this visit.  Subjective Assessment - 02/16/19 1401    Subjective  cancel Tuesday d/t not feeling well. arrived today tearful and hip pain. husband relayed no more MDs can do. Wondering what to do about PT. Spoke with daughter about prior conversation about help in home but worried about Covid    Currently in Pain?  Yes    Pain Score  6     Pain Location  Hip    Pain Orientation  Right;Left  [facility] Adult PT Treatment/Exercise - 02/16/19 0001      Lumbar Exercises: Aerobic   UBE (Upper Arm Bike)  70fd/2back L 3    Nustep  L 5 7 min      Lumbar Exercises: Machines for Strengthening   Cybex Knee Extension  15lb 2x10, LLE 5lb x10     Cybex Knee Flexion  25# 2x15, LLE 15lb x10     Leg Press  30# 3x10      Knee/Hip Exercises: Standing   Other Standing Knee Exercises  with walker 3# 10 reps hip flex,ext,abd,HS curl and marching               PT Short Term Goals - 08/25/18 1153      PT SHORT TERM GOAL #1   Title  independent with her home HEP    Status  Achieved         PT Long Term Goals - 02/16/19 1415      PT LONG TERM GOAL #1   Title  decrease TUG time to 22 seconds for functional gait and safety    Baseline  26.2 sec with RW truns are still difficult for pt    Status  Partially Met      PT LONG TERM GOAL #2   Title  increase Berg balance score to 40/56 for safety    Baseline  35/56    Status  Partially Met      PT LONG TERM GOAL #3   Title  increase LE strength (particularly ABD and ext.) to 4/5 for functional gait and safety    Baseline  4/5 hips and knees tested in sitting except BIL hip flexors 4-/5    Status  Partially Met      PT LONG TERM GOAL #4   Title  be able to do a sit to stand without assistance and without losing balance for functional use    Baseline  pt still slow with STS and initial balance with when starting gait    Status  Partially Met            Plan - 02/16/19 1420    Clinical Impression Statement  pt very fatigued and teary eyed today. explained to pt she is progressing okay and needs to get use to her new normal. she is somewhat plateaued but needs to continue to not loose all she gained. will renew for 1 more month and then discharge with home plans    PT Treatment/Interventions  ADLs/Self Care Home Management;Electrical Stimulation;Moist Heat;Gait training;Stair training;Therapeutic activities;Functional mobility training;Therapeutic exercise;Patient/family education;Neuromuscular re-education;Balance training    PT Next Visit Plan  . Out of town next week,       Patient will benefit from skilled therapeutic intervention in order to improve the following deficits and impairments:  Abnormal gait, Pain, Postural dysfunction, Increased muscle spasms, Cardiopulmonary status limiting activity, Decreased activity tolerance, Decreased strength, Difficulty walking, Decreased balance  Visit Diagnosis: 1. Muscle weakness (generalized)   2. Repeated falls   3. Difficulty in walking, not elsewhere classified   4.  Balance problem        Problem List Patient Active Problem List   Diagnosis Date Noted  . Familial hypercholesteremia 02/21/2018  . Psychogenic gait 10/21/2017  . Coronary artery disease involving native coronary artery of native heart without angina pectoris 06/09/2017  . Abnormal nuclear cardiac imaging test 05/25/2017  . PVC's (premature ventricular contractions) 05/17/2017  . Anxiety 12/29/2016  . V[place] parkinsonism ([place] 06/29/2016  .  Gait disorder 04/07/2016  . Intracranial carotid stenosis, bilateral 02/27/2016  . Essential hypertension   . Left-sided weakness 12/28/2015  . Headache 12/28/2015  . Adjustment reaction with anxiety 12/20/2015  . Left hemiparesis ([place])   . History of CVA with residual deficit   . Prediabetes   . SVT (supraventricular tachycardia) ([place]) 12/14/2015  . Stroke-like symptom 12/13/2015  . History of stroke   . Heart palpitations 09/06/2012  . Hypertension   . Familial hyperlipidemia   . Hypothyroidism   . Vitamin D deficiency   . Palpitations   . History of dizziness   . LVH (left ventricular hypertrophy)   . Aortic stenosis   . Mitral regurgitation   . SOB (shortness of breath)   . Difficulty walking     ,ANGIEPTA 02/16/2019, 2:30 PM  [place] [address] [place], [place], [address] Phone: [phone]   Fax:  [phone]  Name: TRENACE COUGHLIN MRN: [id number] Date of Birth: 11/16/1940

## 2019-02-19 LAB — CUP PACEART REMOTE DEVICE CHECK
Date Time Interrogation Session: 20200718193818
Implantable Pulse Generator Implant Date: 20170516

## 2019-02-20 ENCOUNTER — Ambulatory Visit (INDEPENDENT_AMBULATORY_CARE_PROVIDER_SITE_OTHER): Payer: PPO | Admitting: *Deleted

## 2019-02-20 DIAGNOSIS — I639 Cerebral infarction, unspecified: Secondary | ICD-10-CM | POA: Diagnosis not present

## 2019-02-28 ENCOUNTER — Ambulatory Visit: Payer: PPO | Admitting: Physical Therapy

## 2019-03-02 ENCOUNTER — Ambulatory Visit: Payer: PPO | Admitting: Physical Therapy

## 2019-03-02 ENCOUNTER — Other Ambulatory Visit: Payer: Self-pay

## 2019-03-02 DIAGNOSIS — M6281 Muscle weakness (generalized): Secondary | ICD-10-CM

## 2019-03-02 DIAGNOSIS — R262 Difficulty in walking, not elsewhere classified: Secondary | ICD-10-CM

## 2019-03-02 DIAGNOSIS — R2689 Other abnormalities of gait and mobility: Secondary | ICD-10-CM

## 2019-03-02 DIAGNOSIS — R296 Repeated falls: Secondary | ICD-10-CM | POA: Diagnosis not present

## 2019-03-02 NOTE — Therapy (Signed)
[place], [place], [address] Phone: [phone]   Fax:  [phone]  Physical Therapy Treatment  Patient Details  Name: Stacey Ramirez MRN: [id number] Date of Birth: 1941-01-02 Referring Provider (PT): Shelia Media   Encounter Date: 03/02/2019  PT End of Session - 03/02/19 1438    Visit Number  16    Date for PT Re-Evaluation  03/19/19    PT Start Time  1400    PT Stop Time  1440    PT Time Calculation (min)  40 min       Past Medical History:  Diagnosis Date  . Adjustment reaction with anxiety 12/20/2015  . Aortic stenosis   . CAD (coronary artery disease) 06/09/2017   Nuc study 10/18: EF 69, inferolateral perfusion defect-possible small infarct with peri-infarct ischemia; intermediate risk // LHC 10/18: pLAD 25, pLCx 50, pRCA 25 >> med Rx  . Carotid stenosis, asymptomatic, bilateral 02/27/2016   Carotid US 5/17: Bilateral ICA 1-39  . Cerebrovascular accident (stroke) ([place])    a. 12/2015 - cryptogenic.  S/P MDT Linq.  . Difficulty walking   . Essential hypertension   . Gait disorder 04/07/2016  . Heart palpitations 09/06/2012  . History of CVA with residual deficit 12/14/2015  . History of dizziness   . History of echocardiogram    Echo 10/18: Vigorous LVF, EF 65-70, normal wall motion, grade 1 diastolic dysfunction, GLS -21.1%, mild RAE  . History of nuclear stress test    Nuclear stress test 10/18: EF 69, inf-lat defect c/w poss infarct with peri-infarct ischemia; Intermediate Risk  . Hyperlipidemia   . Hypothyroidism   . Left hemiparesis ([place])   . LVH (left ventricular hypertrophy)   . Mitral regurgitation    a. 12/2015 Echo: EF 60-65%, mild focal basal hypertrophy. No rwma, triv AI, mild MR, mildly dil LA w/o thrombus. No RA thrombus. No PFO.  Marland Kitchen Palpitations   . Prediabetes   . Stroke-like symptom 12/13/2015  . SVT (supraventricular tachycardia) ([place]) 12/14/2015  . Vascular parkinsonism ([place])  06/29/2016  . Vitamin D deficiency     Past Surgical History:  Procedure Laterality Date  . CARDIOVASCULAR STRESS TEST  2002   NORMAL  . EP IMPLANTABLE DEVICE N/A 12/17/2015   Procedure: Loop Recorder Insertion;  Surgeon: [name], MD;  Location: [facility];  Service: Cardiovascular;  Laterality: N/A;  . LEFT HEART CATH AND CORONARY ANGIOGRAPHY N/A 05/25/2017   Procedure: LEFT HEART CATH AND CORONARY ANGIOGRAPHY;  Surgeon: Sherren Mocha, MD;  Location: [facility];  Service: Cardiovascular;  Laterality: N/A;  . LOOP RECORDER IMPLANT    . TEE WITHOUT CARDIOVERSION N/A 12/17/2015   Procedure: TRANSESOPHAGEAL ECHOCARDIOGRAM (TEE);  Surgeon: Lelon Perla, MD;  Location: [facility] ENDOSCOPY;  Service: Cardiovascular;  Laterality: N/A;  Pt also needs a LOOP  . TRANSTHORACIC ECHOCARDIOGRAM  2008   SHOWED LEFT VENTRICULAR HYPERTROPHY AND MILD AORTIC STENOSIS    There were no vitals filed for this visit.  Subjective Assessment - 03/02/19 1401    Subjective  pt stated she has been at beach last week and did well. pt verb not wanting to do push machine ( Nustep) causes hip pain    Currently in Pain?  No/denies                       [facility] Adult PT Treatment/Exercise - 03/02/19 0001      Ambulation/Gait  Gait Comments  amb HHA 150 feet 2 times to work on increased cadeance and left foot stride      High Level Balance   High Level Balance Activities  Marching forwards;Marching backwards;Side stepping   2# HHA     Lumbar Exercises: Seated   Sit to Stand  10 reps   no UE, elevated UBE seat     Knee/Hip Exercises: Standing   Heel Raises  Both;15 reps    Hip Flexion  Stengthening;Both;Knee bent;Knee straight;1 set;15 reps   2#   Hip ADduction  Strengthening;Both;15 reps   2#   Hip Extension  Stengthening;Both;15 reps;Knee straight   2#     Knee/Hip Exercises: Seated   Long Arc Quad  Strengthening;Both;2 sets;10 reps;Weights    Long Arc Quad Weight  2  lbs.    Marching  Strengthening;Both;2 sets;10 reps   2#   Hamstring Curl  Strengthening;Both;2 sets;10 reps   red tband   Abduction/Adduction   Strengthening;Both;2 sets;10 reps;Weights    Abd/Adduction Weights  2 lbs.               PT Short Term Goals - 08/25/18 1153      PT SHORT TERM GOAL #1   Title  independent with her home HEP    Status  Achieved        PT Long Term Goals - 03/02/19 1434      PT LONG TERM GOAL #1   Title  TUG with RW 23.8 sec            Plan - 03/02/19 1439    Clinical Impression Statement  pt back from [place] and doing well. pt verb more active and focusing on advancing left LE with gait- best I have seen her amb in weeks. TUG 3 sec quicker. Pt requested no machines as they may cause pain and she was feeling good. free wt ex/balance and gait. Still issues with STS, initial balance with standing/walking and turns.    PT Treatment/Interventions  ADLs/Self Care Home Management;Electrical Stimulation;Moist Heat;Gait training;Stair training;Therapeutic activities;Functional mobility training;Therapeutic exercise;Patient/family education;Neuromuscular re-education;Balance training    PT Next Visit Plan  Progress       Patient will benefit from skilled therapeutic intervention in order to improve the following deficits and impairments:  Abnormal gait, Pain, Postural dysfunction, Increased muscle spasms, Cardiopulmonary status limiting activity, Decreased activity tolerance, Decreased strength, Difficulty walking, Decreased balance  Visit Diagnosis: 1. Muscle weakness (generalized)   2. Difficulty in walking, not elsewhere classified   3. Balance problem        Problem List Patient Active Problem List   Diagnosis Date Noted  . Familial hypercholesteremia 02/21/2018  . Psychogenic gait 10/21/2017  . Coronary artery disease involving native coronary artery of native heart without angina pectoris 06/09/2017  . Abnormal nuclear cardiac  imaging test 05/25/2017  . PVC's (premature ventricular contractions) 05/17/2017  . Anxiety 12/29/2016  . Vascular parkinsonism ([place]) 06/29/2016  . Gait disorder 04/07/2016  . Intracranial carotid stenosis, bilateral 02/27/2016  . Essential hypertension   . Left-sided weakness 12/28/2015  . Headache 12/28/2015  . Adjustment reaction with anxiety 12/20/2015  . Left hemiparesis ([place])   . History of CVA with residual deficit   . Prediabetes   . SVT (supraventricular tachycardia) ([place]) 12/14/2015  . Stroke-like symptom 12/13/2015  . History of stroke   . Heart palpitations 09/06/2012  . Hypertension   . Familial hyperlipidemia   . Hypothyroidism   . Vitamin D deficiency   .  Palpitations   . History of dizziness   . LVH (left ventricular hypertrophy)   . Aortic stenosis   . Mitral regurgitation   . SOB (shortness of breath)   . Difficulty walking     Dallon Dacosta,ANGIE PTA 03/02/2019, 2:41 PM  [place] [address] [place], [place], [address] Phone: [phone]   Fax:  [phone]  Name: MORIAH SHAWLEY MRN: [id number] Date of Birth: [birth year]

## 2019-03-06 NOTE — Progress Notes (Signed)
Carelink Summary Report / Loop Recorder 

## 2019-03-07 ENCOUNTER — Other Ambulatory Visit: Payer: Self-pay

## 2019-03-07 ENCOUNTER — Ambulatory Visit: Payer: PPO | Attending: Specialist | Admitting: Physical Therapy

## 2019-03-07 DIAGNOSIS — R2689 Other abnormalities of gait and mobility: Secondary | ICD-10-CM | POA: Insufficient documentation

## 2019-03-07 DIAGNOSIS — R296 Repeated falls: Secondary | ICD-10-CM | POA: Diagnosis not present

## 2019-03-07 DIAGNOSIS — R262 Difficulty in walking, not elsewhere classified: Secondary | ICD-10-CM | POA: Diagnosis not present

## 2019-03-07 DIAGNOSIS — M6281 Muscle weakness (generalized): Secondary | ICD-10-CM | POA: Diagnosis not present

## 2019-03-07 NOTE — Therapy (Signed)
[place], [place], [address] Phone: [phone]   Fax:  [phone]  Physical Therapy Treatment  Patient Details  Name: Stacey Ramirez MRN: [id number] Date of Birth: [birth year] Referring Provider (PT): Shelia Media   Encounter Date: 03/07/2019  PT End of Session - 03/07/19 3474    Visit Number  17    Date for PT Re-Evaluation  03/19/19    Authorization - Number of Visits  1400    PT Start Time  2595       Past Medical History:  Diagnosis Date  . Adjustment reaction with anxiety 12/20/2015  . Aortic stenosis   . CAD (coronary artery disease) 06/09/2017   Nuc study 10/18: EF 69, inferolateral perfusion defect-possible small infarct with peri-infarct ischemia; intermediate risk // LHC 10/18: pLAD 25, pLCx 50, pRCA 25 >> med Rx  . Carotid stenosis, asymptomatic, bilateral 02/27/2016   Carotid US 5/17: Bilateral ICA 1-39  . Cerebrovascular accident (stroke) ([place])    a. 12/2015 - cryptogenic.  S/P MDT Linq.  . Difficulty walking   . Essential hypertension   . Gait disorder 04/07/2016  . Heart palpitations 09/06/2012  . History of CVA with residual deficit 12/14/2015  . History of dizziness   . History of echocardiogram    Echo 10/18: Vigorous LVF, EF 65-70, normal wall motion, grade 1 diastolic dysfunction, GLS -21.1%, mild RAE  . History of nuclear stress test    Nuclear stress test 10/18: EF 69, inf-lat defect c/w poss infarct with peri-infarct ischemia; Intermediate Risk  . Hyperlipidemia   . Hypothyroidism   . Left hemiparesis ([place])   . LVH (left ventricular hypertrophy)   . Mitral regurgitation    a. 12/2015 Echo: EF 60-65%, mild focal basal hypertrophy. No rwma, triv AI, mild MR, mildly dil LA w/o thrombus. No RA thrombus. No PFO.  Marland Kitchen Palpitations   . Prediabetes   . Stroke-like symptom 12/13/2015  . SVT (supraventricular tachycardia) ([place]) 12/14/2015  . Vascular parkinsonism ([place]) 06/29/2016  .  Vitamin D deficiency     Past Surgical History:  Procedure Laterality Date  . CARDIOVASCULAR STRESS TEST  2002   NORMAL  . EP IMPLANTABLE DEVICE N/A 12/17/2015   Procedure: Loop Recorder Insertion;  Surgeon: [name], MD;  Location: [facility];  Service: Cardiovascular;  Laterality: N/A;  . LEFT HEART CATH AND CORONARY ANGIOGRAPHY N/A 05/25/2017   Procedure: LEFT HEART CATH AND CORONARY ANGIOGRAPHY;  Surgeon: Sherren Mocha, MD;  Location: [place] [facility];  Service: Cardiovascular;  Laterality: N/A;  . LOOP RECORDER IMPLANT    . TEE WITHOUT CARDIOVERSION N/A 12/17/2015   Procedure: TRANSESOPHAGEAL ECHOCARDIOGRAM (TEE);  Surgeon: Lelon Perla, MD;  Location: [facility] ENDOSCOPY;  Service: Cardiovascular;  Laterality: N/A;  Pt also needs a LOOP  . TRANSTHORACIC ECHOCARDIOGRAM  2008   SHOWED LEFT VENTRICULAR HYPERTROPHY AND MILD AORTIC STENOSIS    There were no vitals filed for this visit.  Subjective Assessment - 03/07/19 1405    Subjective  moving pretty good. last tx was helpful and I was not as sore. BIL distal LE pitting edema noted    Currently in Pain?  Yes    Pain Score  4     Pain Location  Hip                       [facility] Adult PT Treatment/Exercise - 03/07/19 0001      Ambulation/Gait  Gait Comments  amb to car HHA 180 feet working on stride and cadeance min A       High Level Balance   High Level Balance Activities  Marching forwards;Marching backwards;Side stepping   HHA 3 #     Knee/Hip Exercises: Aerobic   Nustep  L 3 6 min      Knee/Hip Exercises: Standing   Heel Raises  Both;15 reps    Hip Flexion  Stengthening;Both;Knee bent;Knee straight;1 set;15 reps   3#   Hip ADduction  Strengthening;Both;15 reps   3#   Hip Extension  Stengthening;Both;15 reps;Knee straight   3#     Knee/Hip Exercises: Seated   Long Arc Quad  Strengthening;Both;2 sets;10 reps;Weights    Long Arc Quad Weight  3 lbs.    Marching  Strengthening;Both;2  sets;10 reps    Marching Weights  3 lbs.    Hamstring Curl  Strengthening;Both;2 sets;10 reps   red tband   Abduction/Adduction   Strengthening;Both;2 sets;10 reps;Weights    Abd/Adduction Weights  3 lbs.    Sit to Sand  3 sets;5 reps;without UE support   occassional min A for posterior lean              PT Short Term Goals - 08/25/18 1153      PT SHORT TERM GOAL #1   Title  independent with her home HEP    Status  Achieved        PT Long Term Goals - 03/02/19 1434      PT LONG TERM GOAL #1   Title  TUG with RW 23.8 sec            Plan - 03/07/19 1444    Clinical Impression Statement  pt tolerating ex well, increased to 3#. pt more aware and trying ot move left leg with bigger ROM. pt still has trouble with turns and thresholds. STS pt tends to lean backwards causing LOB.    PT Treatment/Interventions  ADLs/Self Care Home Management;Electrical Stimulation;Moist Heat;Gait training;Stair training;Therapeutic activities;Functional mobility training;Therapeutic exercise;Patient/family education;Neuromuscular re-education;Balance training    PT Next Visit Plan  progress as tolerated       Patient will benefit from skilled therapeutic intervention in order to improve the following deficits and impairments:  Abnormal gait, Pain, Postural dysfunction, Increased muscle spasms, Cardiopulmonary status limiting activity, Decreased activity tolerance, Decreased strength, Difficulty walking, Decreased balance  Visit Diagnosis: 1. Difficulty in walking, not elsewhere classified   2. Muscle weakness (generalized)   3. Balance problem   4. Repeated falls        Problem List Patient Active Problem List   Diagnosis Date Noted  . Familial hypercholesteremia 02/21/2018  . Psychogenic gait 10/21/2017  . Coronary artery disease involving native coronary artery of native heart without angina pectoris 06/09/2017  . Abnormal nuclear cardiac imaging test 05/25/2017  . PVC's  (premature ventricular contractions) 05/17/2017  . Anxiety 12/29/2016  . Vascular parkinsonism ([place]) 06/29/2016  . Gait disorder 04/07/2016  . Intracranial carotid stenosis, bilateral 02/27/2016  . Essential hypertension   . Left-sided weakness 12/28/2015  . Headache 12/28/2015  . Adjustment reaction with anxiety 12/20/2015  . Left hemiparesis ([place])   . History of CVA with residual deficit   . Prediabetes   . SVT (supraventricular tachycardia) ([place]) 12/14/2015  . Stroke-like symptom 12/13/2015  . History of stroke   . Heart palpitations 09/06/2012  . Hypertension   . Familial hyperlipidemia   . Hypothyroidism   . Vitamin D deficiency   .  Palpitations   . History of dizziness   . LVH (left ventricular hypertrophy)   . Aortic stenosis   . Mitral regurgitation   . SOB (shortness of breath)   . Difficulty walking     PAYSEUR,ANGIE PTA 03/07/2019, 2:53 PM  [place], [place], [address] Phone: [phone]   Fax:  [phone]  Name: Stacey Ramirez MRN: [id number] Date of Birth: [birth year]

## 2019-03-09 ENCOUNTER — Ambulatory Visit: Payer: PPO | Admitting: Physical Therapy

## 2019-03-09 ENCOUNTER — Other Ambulatory Visit: Payer: Self-pay

## 2019-03-09 DIAGNOSIS — R2689 Other abnormalities of gait and mobility: Secondary | ICD-10-CM

## 2019-03-09 DIAGNOSIS — R262 Difficulty in walking, not elsewhere classified: Secondary | ICD-10-CM

## 2019-03-09 DIAGNOSIS — R296 Repeated falls: Secondary | ICD-10-CM

## 2019-03-09 DIAGNOSIS — M6281 Muscle weakness (generalized): Secondary | ICD-10-CM

## 2019-03-09 NOTE — Therapy (Signed)
[place], [place], [address] Phone: [phone]   Fax:  [phone]  Physical Therapy Treatment  Patient Details  Name: Stacey Ramirez MRN: [id number] Date of Birth: 02-Mar-1941 Referring Provider (PT): Shelia Media   Encounter Date: 03/09/2019  PT End of Session - 03/09/19 1431    Visit Number  18    Date for PT Re-Evaluation  03/19/19    PT Start Time  9024    PT Stop Time  0973    PT Time Calculation (min)  50 min       Past Medical History:  Diagnosis Date  . Adjustment reaction with anxiety 12/20/2015  . Aortic stenosis   . CAD (coronary artery disease) 06/09/2017   Nuc study 10/18: EF 69, inferolateral perfusion defect-possible small infarct with peri-infarct ischemia; intermediate risk // LHC 10/18: pLAD 25, pLCx 50, pRCA 25 >> med Rx  . Carotid stenosis, asymptomatic, bilateral 02/27/2016   Carotid US 5/17: Bilateral ICA 1-39  . Cerebrovascular accident (stroke) ([place])    a. 12/2015 - cryptogenic.  S/P MDT Linq.  . Difficulty walking   . Essential hypertension   . Gait disorder 04/07/2016  . Heart palpitations 09/06/2012  . History of CVA with residual deficit 12/14/2015  . History of dizziness   . History of echocardiogram    Echo 10/18: Vigorous LVF, EF 65-70, normal wall motion, grade 1 diastolic dysfunction, GLS -21.1%, mild RAE  . History of nuclear stress test    Nuclear stress test 10/18: EF 69, inf-lat defect c/w poss infarct with peri-infarct ischemia; Intermediate Risk  . Hyperlipidemia   . Hypothyroidism   . Left hemiparesis ([place])   . LVH (left ventricular hypertrophy)   . Mitral regurgitation    a. 12/2015 Echo: EF 60-65%, mild focal basal hypertrophy. No rwma, triv AI, mild MR, mildly dil LA w/o thrombus. No RA thrombus. No PFO.  Marland Kitchen Palpitations   . Prediabetes   . Stroke-like symptom 12/13/2015  . SVT (supraventricular tachycardia) ([place]) 12/14/2015  . Vascular parkinsonism ([place])  06/29/2016  . Vitamin D deficiency     Past Surgical History:  Procedure Laterality Date  . CARDIOVASCULAR STRESS TEST  2002   NORMAL  . EP IMPLANTABLE DEVICE N/A 12/17/2015   Procedure: Loop Recorder Insertion;  Surgeon: [name], MD;  Location: [place] [facility];  Service: Cardiovascular;  Laterality: N/A;  . LEFT HEART CATH AND CORONARY ANGIOGRAPHY N/A 05/25/2017   Procedure: LEFT HEART CATH AND CORONARY ANGIOGRAPHY;  Surgeon: Sherren Mocha, MD;  Location: [facility];  Service: Cardiovascular;  Laterality: N/A;  . LOOP RECORDER IMPLANT    . TEE WITHOUT CARDIOVERSION N/A 12/17/2015   Procedure: TRANSESOPHAGEAL ECHOCARDIOGRAM (TEE);  Surgeon: Lelon Perla, MD;  Location: [facility] ENDOSCOPY;  Service: Cardiovascular;  Laterality: N/A;  Pt also needs a LOOP  . TRANSTHORACIC ECHOCARDIOGRAM  2008   SHOWED LEFT VENTRICULAR HYPERTROPHY AND MILD AORTIC STENOSIS    There were no vitals filed for this visit.  Subjective Assessment - 03/09/19 1409    Subjective  per pt and husband need to step up ex again- got pains from MD in case    Currently in Pain?  No/denies                       [facility] Adult PT Treatment/Exercise - 03/09/19 0001      Ambulation/Gait   Gait Comments  work on increased speed of  gait in hall with RW, cued to increase step length      Berg Balance Test   Sit to Stand  Able to stand  independently using hands    Standing Unsupported  Able to stand safely 2 minutes    Sitting with Back Unsupported but Feet Supported on Floor or Stool  Able to sit safely and securely 2 minutes    Stand to Sit  Controls descent by using hands    Transfers  Able to transfer safely, definite need of hands    Standing Unsupported with Eyes Closed  Able to stand 10 seconds with supervision    Standing Ubsupported with Feet Together  Able to place feet together independently and stand for 1 minute with supervision    From Standing, Reach Forward with Outstretched Arm   Can reach forward >12 cm safely (5")    From Standing Position, Pick up Object from Floor  Able to pick up shoe, needs supervision    From Standing Position, Turn to Look Behind Over each Shoulder  Looks behind from both sides and weight shifts well    Turn 360 Degrees  Able to turn 360 degrees safely but slowly    Standing Unsupported, Alternately Place Feet on Step/Stool  Able to complete >2 steps/needs minimal assist    Standing Unsupported, One Foot in Front  Needs help to step but can hold 15 seconds    Standing on One Leg  Tries to lift leg/unable to hold 3 seconds but remains standing independently    Total Score  38      High Level Balance   High Level Balance Activities  Marching forwards;Backward walking;Side stepping   HHA in hallway with cuing     Lumbar Exercises: Machines for Strengthening   Cybex Knee Extension  10# 2x10, LLE 5lb x10     Cybex Knee Flexion  20# 2 sets 15, LLE 15# 10x    Leg Press  30# 3x10      Knee/Hip Exercises: Aerobic   Nustep  L 3 6 min      Knee/Hip Exercises: Standing   Forward Step Up  Both;1 set;10 reps;Hand Hold: 2;Step Height: 4"    Forward Step Up Limitations  alt step tap 10 2 sets- min A with LOB backward    Walking with Sports Cord  20# 5 times fwd/back mod A esp backward               PT Short Term Goals - 08/25/18 1153      PT SHORT TERM GOAL #1   Title  independent with her home HEP    Status  Achieved        PT Long Term Goals - 03/09/19 1420      PT LONG TERM GOAL #1   Title  TUG with RW 23.8 sec    Baseline  25.4 with RW    Status  Partially Met      PT LONG TERM GOAL #2   Title  increase Berg balance score to 40/56 for safety    Baseline  38/56 , increased 3 pts    Status  Partially Met      PT LONG TERM GOAL #3   Title  increase LE strength (particularly ABD and ext.) to 4/5 for functional gait and safety    Baseline  4/5 hips and knees tested in sitting except BIL hip flexors 4-/5    Status  Partially  Met  PT LONG TERM GOAL #4   Title  be able to do a sit to stand without assistance and without losing balance for functional use    Status  Partially Met            Plan - 03/09/19 1431    Clinical Impression Statement  BERG increased 3 pts.TUG slightly better. Min A with ex for balance and cued to pick left foot up. Last week pt felt machines increased pain but today pt and husband requested machines and have meds for pain    PT Treatment/Interventions  ADLs/Self Care Home Management;Electrical Stimulation;Moist Heat;Gait training;Stair training;Therapeutic activities;Functional mobility training;Therapeutic exercise;Patient/family education;Neuromuscular re-education;Balance training    PT Next Visit Plan  assess and progress       Patient will benefit from skilled therapeutic intervention in order to improve the following deficits and impairments:  Abnormal gait, Pain, Postural dysfunction, Increased muscle spasms, Cardiopulmonary status limiting activity, Decreased activity tolerance, Decreased strength, Difficulty walking, Decreased balance  Visit Diagnosis: 1. Muscle weakness (generalized)   2. Difficulty in walking, not elsewhere classified   3. Balance problem   4. Repeated falls        Problem List Patient Active Problem List   Diagnosis Date Noted  . Familial hypercholesteremia 02/21/2018  . Psychogenic gait 10/21/2017  . Coronary artery disease involving native coronary artery of native heart without angina pectoris 06/09/2017  . Abnormal nuclear cardiac imaging test 05/25/2017  . PVC's (premature ventricular contractions) 05/17/2017  . Anxiety 12/29/2016  . Vascular parkinsonism ([place]) 06/29/2016  . Gait disorder 04/07/2016  . Intracranial carotid stenosis, bilateral 02/27/2016  . Essential hypertension   . Left-sided weakness 12/28/2015  . Headache 12/28/2015  . Adjustment reaction with anxiety 12/20/2015  . Left hemiparesis ([place])   . History of CVA  with residual deficit   . Prediabetes   . SVT (supraventricular tachycardia) ([place]) 12/14/2015  . Stroke-like symptom 12/13/2015  . History of stroke   . Heart palpitations 09/06/2012  . Hypertension   . Familial hyperlipidemia   . Hypothyroidism   . Vitamin D deficiency   . Palpitations   . History of dizziness   . LVH (left ventricular hypertrophy)   . Aortic stenosis   . Mitral regurgitation   . SOB (shortness of breath)   . Difficulty walking     Shaolin Armas,ANGIE PTA 03/09/2019, 2:35 PM  [place], [place], [address] Phone: [phone]   Fax:  [phone]  Name: Stacey Ramirez MRN: [id number] Date of Birth: [birth year]

## 2019-03-12 DIAGNOSIS — G20 Parkinson's disease: Secondary | ICD-10-CM | POA: Diagnosis not present

## 2019-03-12 DIAGNOSIS — R269 Unspecified abnormalities of gait and mobility: Secondary | ICD-10-CM | POA: Diagnosis not present

## 2019-03-13 DIAGNOSIS — I10 Essential (primary) hypertension: Secondary | ICD-10-CM | POA: Diagnosis not present

## 2019-03-13 DIAGNOSIS — Z7901 Long term (current) use of anticoagulants: Secondary | ICD-10-CM | POA: Diagnosis not present

## 2019-03-13 DIAGNOSIS — R350 Frequency of micturition: Secondary | ICD-10-CM | POA: Diagnosis not present

## 2019-03-13 DIAGNOSIS — F329 Major depressive disorder, single episode, unspecified: Secondary | ICD-10-CM | POA: Diagnosis not present

## 2019-03-13 DIAGNOSIS — I639 Cerebral infarction, unspecified: Secondary | ICD-10-CM | POA: Diagnosis not present

## 2019-03-13 DIAGNOSIS — E039 Hypothyroidism, unspecified: Secondary | ICD-10-CM | POA: Diagnosis not present

## 2019-03-14 ENCOUNTER — Other Ambulatory Visit: Payer: Self-pay

## 2019-03-14 ENCOUNTER — Ambulatory Visit: Payer: PPO | Admitting: Physical Therapy

## 2019-03-14 DIAGNOSIS — R2689 Other abnormalities of gait and mobility: Secondary | ICD-10-CM

## 2019-03-14 DIAGNOSIS — R262 Difficulty in walking, not elsewhere classified: Secondary | ICD-10-CM | POA: Diagnosis not present

## 2019-03-14 DIAGNOSIS — M6281 Muscle weakness (generalized): Secondary | ICD-10-CM

## 2019-03-14 DIAGNOSIS — R296 Repeated falls: Secondary | ICD-10-CM

## 2019-03-14 NOTE — Therapy (Signed)
[place], [place], [address] Phone: [phone]   Fax:  [phone]  Physical Therapy Treatment  Patient Details  Name: Stacey Ramirez MRN: [id number] Date of Birth: 08-18-1940 Referring Provider (PT): Pharr   Encounter Date: 03/14/2019  PT End of Session - 03/14/19 1452    PT Start Time  1400    PT Stop Time  1445    PT Time Calculation (min)  45 min       Past Medical History:  Diagnosis Date  . Adjustment reaction with anxiety 12/20/2015  . Aortic stenosis   . CAD (coronary artery disease) 06/09/2017   Nuc study 10/18: EF 69, inferolateral perfusion defect-possible small infarct with peri-infarct ischemia; intermediate risk // LHC 10/18: pLAD 25, pLCx 50, pRCA 25 >> med Rx  . Carotid stenosis, asymptomatic, bilateral 02/27/2016   Carotid US 5/17: Bilateral ICA 1-39  . Cerebrovascular accident (stroke) ([place])    a. 12/2015 - cryptogenic.  S/P MDT Linq.  . Difficulty walking   . Essential hypertension   . Gait disorder 04/07/2016  . Heart palpitations 09/06/2012  . History of CVA with residual deficit 12/14/2015  . History of dizziness   . History of echocardiogram    Echo 10/18: Vigorous LVF, EF 65-70, normal wall motion, grade 1 diastolic dysfunction, GLS -21.1%, mild RAE  . History of nuclear stress test    Nuclear stress test 10/18: EF 69, inf-lat defect c/w poss infarct with peri-infarct ischemia; Intermediate Risk  . Hyperlipidemia   . Hypothyroidism   . Left hemiparesis ([place])   . LVH (left ventricular hypertrophy)   . Mitral regurgitation    a. 12/2015 Echo: EF 60-65%, mild focal basal hypertrophy. No rwma, triv AI, mild MR, mildly dil LA w/o thrombus. No RA thrombus. No PFO.  Marland Kitchen Palpitations   . Prediabetes   . Stroke-like symptom 12/13/2015  . SVT (supraventricular tachycardia) ([place]) 12/14/2015  . Vascular parkinsonism ([place]) 06/29/2016  . Vitamin D deficiency     Past Surgical  History:  Procedure Laterality Date  . CARDIOVASCULAR STRESS TEST  2002   NORMAL  . EP IMPLANTABLE DEVICE N/A 12/17/2015   Procedure: Loop Recorder Insertion;  Surgeon: [name], MD;  Location: [place] [facility];  Service: Cardiovascular;  Laterality: N/A;  . LEFT HEART CATH AND CORONARY ANGIOGRAPHY N/A 05/25/2017   Procedure: LEFT HEART CATH AND CORONARY ANGIOGRAPHY;  Surgeon: Sherren Mocha, MD;  Location: [facility];  Service: Cardiovascular;  Laterality: N/A;  . LOOP RECORDER IMPLANT    . TEE WITHOUT CARDIOVERSION N/A 12/17/2015   Procedure: TRANSESOPHAGEAL ECHOCARDIOGRAM (TEE);  Surgeon: Lelon Perla, MD;  Location: [facility];  Service: Cardiovascular;  Laterality: N/A;  Pt also needs a LOOP  . TRANSTHORACIC ECHOCARDIOGRAM  2008   SHOWED LEFT VENTRICULAR HYPERTROPHY AND MILD AORTIC STENOSIS    There were no vitals filed for this visit.  Subjective Assessment - 03/14/19 1405    Subjective  feet very swollen- rec compression hose ( pt verb she has some ). pt verb no increased pain after last session    Currently in Pain?  No/denies                       [facility] Adult PT Treatment/Exercise - 03/14/19 0001      High Level Balance   High Level Balance Activities  Negotiating over obstacles   ball toss on airex   High  Level Balance Comments  cued to right LOB as she tends to fall backward with wt thru heels      Lumbar Exercises: Aerobic   UBE (Upper Arm Bike)  83fd/2back L 3    Nustep  L 5 7 min      Lumbar Exercises: Machines for Strengthening   Cybex Knee Extension  10# 2x10, LLE 5lb x10     Cybex Knee Flexion  20# 2 sets 15, LLE 15# 10x    Leg Press  30# 3x10      Lumbar Exercises: Seated   Sit to Stand  10 reps   no UE slight elevated UBE seat     Knee/Hip Exercises: Standing   Lateral Step Up  Both;1 set;10 reps;Hand Hold: 2;Step Height: 6"    Forward Step Up  Both;1 set;10 reps;Hand Hold: 2;Step Height: 6"               PT  Short Term Goals - 08/25/18 1153      PT SHORT TERM GOAL #1   Title  independent with her home HEP    Status  Achieved        PT Long Term Goals - 03/09/19 1420      PT LONG TERM GOAL #1   Title  TUG with RW 23.8 sec    Baseline  25.4 with RW    Status  Partially Met      PT LONG TERM GOAL #2   Title  increase Berg balance score to 40/56 for safety    Baseline  38/56 , increased 3 pts    Status  Partially Met      PT LONG TERM GOAL #3   Title  increase LE strength (particularly ABD and ext.) to 4/5 for functional gait and safety    Baseline  4/5 hips and knees tested in sitting except BIL hip flexors 4-/5    Status  Partially Met      PT LONG TERM GOAL #4   Title  be able to do a sit to stand without assistance and without losing balance for functional use    Status  Partially Met            Plan - 03/14/19 1443    Clinical Impression Statement  pt able to right herself with verb and tactile cuing, working to advance and clear left foot with activities.progressing with strength actvities.    PT Treatment/Interventions  ADLs/Self Care Home Management;Electrical Stimulation;Moist Heat;Gait training;Stair training;Therapeutic activities;Functional mobility training;Therapeutic exercise;Patient/family education;Neuromuscular re-education;Balance training    PT Next Visit Plan  renewal       Patient will benefit from skilled therapeutic intervention in order to improve the following deficits and impairments:  Abnormal gait, Pain, Postural dysfunction, Increased muscle spasms, Cardiopulmonary status limiting activity, Decreased activity tolerance, Decreased strength, Difficulty walking, Decreased balance  Visit Diagnosis: 1. Muscle weakness (generalized)   2. Difficulty in walking, not elsewhere classified   3. Balance problem   4. Repeated falls        Problem List Patient Active Problem List   Diagnosis Date Noted  . Familial hypercholesteremia 02/21/2018  .  Psychogenic gait 10/21/2017  . Coronary artery disease involving native coronary artery of native heart without angina pectoris 06/09/2017  . Abnormal nuclear cardiac imaging test 05/25/2017  . PVC's (premature ventricular contractions) 05/17/2017  . Anxiety 12/29/2016  . V[place] parkinsonism ([place] 06/29/2016  . Gait disorder 04/07/2016  . Intracranial carotid stenosis, bilateral 02/27/2016  . Essential  hypertension   . Left-sided weakness 12/28/2015  . Headache 12/28/2015  . Adjustment reaction with anxiety 12/20/2015  . Left hemiparesis ([place])   . History of CVA with residual deficit   . Prediabetes   . SVT (supraventricular tachycardia) ([place]) 12/14/2015  . Stroke-like symptom 12/13/2015  . History of stroke   . Heart palpitations 09/06/2012  . Hypertension   . Familial hyperlipidemia   . Hypothyroidism   . Vitamin D deficiency   . Palpitations   . History of dizziness   . LVH (left ventricular hypertrophy)   . Aortic stenosis   . Mitral regurgitation   . SOB (shortness of breath)   . Difficulty walking     ,ANGIE PTA 03/14/2019, 2:53 PM  [facility], [place], [address] Phone: [phone]   Fax:  [phone]  Name: MISSI MCMACKIN MRN: [id number] Date of Birth: 09-22-1940

## 2019-03-15 DIAGNOSIS — S32000S Wedge compression fracture of unspecified lumbar vertebra, sequela: Secondary | ICD-10-CM | POA: Diagnosis not present

## 2019-03-15 DIAGNOSIS — M810 Age-related osteoporosis without current pathological fracture: Secondary | ICD-10-CM | POA: Diagnosis not present

## 2019-03-15 DIAGNOSIS — M549 Dorsalgia, unspecified: Secondary | ICD-10-CM | POA: Diagnosis not present

## 2019-03-16 ENCOUNTER — Ambulatory Visit: Payer: PPO | Admitting: Physical Therapy

## 2019-03-16 ENCOUNTER — Other Ambulatory Visit: Payer: Self-pay

## 2019-03-16 DIAGNOSIS — R2689 Other abnormalities of gait and mobility: Secondary | ICD-10-CM

## 2019-03-16 DIAGNOSIS — R262 Difficulty in walking, not elsewhere classified: Secondary | ICD-10-CM | POA: Diagnosis not present

## 2019-03-16 DIAGNOSIS — M6281 Muscle weakness (generalized): Secondary | ICD-10-CM

## 2019-03-16 NOTE — Therapy (Addendum)
[place], [place], [address] Phone: [phone]   Fax:  [phone] Progress Note Reporting Period 01/30/19 to 03/16/19 for vists 11-20  See note below for Objective Data and Assessment of Progress/Goals.      Physical Therapy Treatment  Patient Details  Name: Stacey Ramirez MRN: [id number] Date of Birth: 20-Jul-1941 Referring Provider (PT): Pharr   Encounter Date: 03/16/2019  PT End of Session - 03/16/19 1434    Visit Number  20    Date for PT Re-Evaluation  03/19/19    Authorization - Number of Visits  1400    PT Start Time  6812       Past Medical History:  Diagnosis Date  . Adjustment reaction with anxiety 12/20/2015  . Aortic stenosis   . CAD (coronary artery disease) 06/09/2017   Nuc study 10/18: EF 69, inferolateral perfusion defect-possible small infarct with peri-infarct ischemia; intermediate risk // LHC 10/18: pLAD 25, pLCx 50, pRCA 25 >> med Rx  . Carotid stenosis, asymptomatic, bilateral 02/27/2016   Carotid US 5/17: Bilateral ICA 1-39  . Cerebrovascular accident (stroke) ([place])    a. 12/2015 - cryptogenic.  S/P MDT Linq.  . Difficulty walking   . Essential hypertension   . Gait disorder 04/07/2016  . Heart palpitations 09/06/2012  . History of CVA with residual deficit 12/14/2015  . History of dizziness   . History of echocardiogram    Echo 10/18: Vigorous LVF, EF 65-70, normal wall motion, grade 1 diastolic dysfunction, GLS -21.1%, mild RAE  . History of nuclear stress test    Nuclear stress test 10/18: EF 69, inf-lat defect c/w poss infarct with peri-infarct ischemia; Intermediate Risk  . Hyperlipidemia   . Hypothyroidism   . Left hemiparesis ([place])   . LVH (left ventricular hypertrophy)   . Mitral regurgitation    a. 12/2015 Echo: EF 60-65%, mild focal basal hypertrophy. No rwma, triv AI, mild MR, mildly dil LA w/o thrombus. No RA thrombus. No PFO.  Marland Kitchen Palpitations   .  Prediabetes   . Stroke-like symptom 12/13/2015  . SVT (supraventricular tachycardia) ([place]) 12/14/2015  . 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) 06/29/2016  . Vitamin D deficiency     Past Surgical History:  Procedure Laterality Date  . CARDIOVASCULAR STRESS TEST  2002   NORMAL  . EP IMPLANTABLE DEVICE N/A 12/17/2015   Procedure: Loop Recorder Insertion;  Surgeon: [name], MD;  Location: [place] [facility];  Service: Cardiovascular;  Laterality: N/A;  . LEFT HEART CATH AND CORONARY ANGIOGRAPHY N/A 05/25/2017   Procedure: LEFT HEART CATH AND CORONARY ANGIOGRAPHY;  Surgeon: Sherren Mocha, MD;  Location: [place] [facility];  Service: Cardiovascular;  Laterality: N/A;  . LOOP RECORDER IMPLANT    . TEE WITHOUT CARDIOVERSION N/A 12/17/2015   Procedure: TRANSESOPHAGEAL ECHOCARDIOGRAM (TEE);  Surgeon: Lelon Perla, MD;  [facility];  Service: Cardiovascular;  Laterality: N/A;  Pt also needs a LOOP  . TRANSTHORACIC ECHOCARDIOGRAM  2008   SHOWED LEFT VENTRICULAR HYPERTROPHY AND MILD AORTIC STENOSIS    There were no vitals filed for this visit.  Subjective Assessment - 03/16/19 1404    Subjective  need to get new compression hose to knees- old ones were to thigh. "almost did not come today I was so tired" pt verb fell in bathroom this morning ( about 8 am)- denies hurting herself. says she was getting up from commode and turning and lost balance    Currently  in Pain?  No/denies                       [facility] Adult PT Treatment/Exercise - 03/16/19 0001      High Level Balance   High Level Balance Activities  Negotiating over obstacles;Backward walking;Marching forwards;Side stepping      Lumbar Exercises: Aerobic   Nustep  L 5 8 min      Lumbar Exercises: Machines for Strengthening   Cybex Lumbar Extension  black tband flex and ext 2 sets 10    Cybex Knee Extension  10# 2x10, LLE 5lb x10     Cybex Knee Flexion  20# 2 sets 15, LLE 15# 10x    Leg Press  30# 3x10       Lumbar Exercises: Seated   Sit to Stand  10 reps   no UE from mat min A with cuing 50%              PT Short Term Goals - 08/25/18 1153      PT SHORT TERM GOAL #1   Title  independent with her home HEP    Status  Achieved        PT Long Term Goals - 03/09/19 1420      PT LONG TERM GOAL #1   Title  TUG with RW 23.8 sec    Baseline  25.4 with RW    Status  Partially Met      PT LONG TERM GOAL #2   Title  increase Berg balance score to 40/56 for safety    Baseline  38/56 , increased 3 pts    Status  Partially Met      PT LONG TERM GOAL #3   Title  increase LE strength (particularly ABD and ext.) to 4/5 for functional gait and safety    Baseline  4/5 hips and knees tested in sitting except BIL hip flexors 4-/5    Status  Partially Met      PT LONG TERM GOAL #4   Title  be able to do a sit to stand without assistance and without losing balance for functional use    Status  Partially Met            Plan - 03/16/19 1434    Clinical Impression Statement  pt arrived with new script for back , pt denies back pain even with fall this morning. will defer eval for now but will add in back stab ex. pt more SOB and weak today after fall. educ pt and husband of edema control    PT Treatment/Interventions  ADLs/Self Care Home Management;Electrical Stimulation;Moist Heat;Gait training;Stair training;Therapeutic activities;Functional mobility training;Therapeutic exercise;Patient/family education;Neuromuscular re-education;Balance training    PT Next Visit Plan  renewal       Patient will benefit from skilled therapeutic intervention in order to improve the following deficits and impairments:  Abnormal gait, Pain, Postural dysfunction, Increased muscle spasms, Cardiopulmonary status limiting activity, Decreased activity tolerance, Decreased strength, Difficulty walking, Decreased balance  Visit Diagnosis: 1. Muscle weakness (generalized)   2. Difficulty in walking, not  elsewhere classified   3. Balance problem        Problem List Patient Active Problem List   Diagnosis Date Noted  . Familial hypercholesteremia 02/21/2018  . Psychogenic gait 10/21/2017  . Coronary artery disease involving native coronary artery of native heart without angina pectoris 06/09/2017  . Abnormal nuclear cardiac imaging test 05/25/2017  . PVC's (premature ventricular contractions) 05/17/2017  .  Anxiety 12/29/2016  . Vascular parkinsonism ([place]) 06/29/2016  . Gait disorder 04/07/2016  . Intracranial carotid stenosis, bilateral 02/27/2016  . Essential hypertension   . Left-sided weakness 12/28/2015  . Headache 12/28/2015  . Adjustment reaction with anxiety 12/20/2015  . Left hemiparesis ([place])   . History of CVA with residual deficit   . Prediabetes   . SVT (supraventricular tachycardia) ([place]) 12/14/2015  . Stroke-like symptom 12/13/2015  . History of stroke   . Heart palpitations 09/06/2012  . Hypertension   . Familial hyperlipidemia   . Hypothyroidism   . Vitamin D deficiency   . Palpitations   . History of dizziness   . LVH (left ventricular hypertrophy)   . Aortic stenosis   . Mitral regurgitation   . SOB (shortness of breath)   . Difficulty walking     ,ANGIE PTA 03/16/2019, 2:36 PM  [place], [place], [address] Phone: [phone]   Fax:  [phone]  Name: Stacey Ramirez MRN: [id number] Date of Birth: 06/28/1941

## 2019-03-20 NOTE — Addendum Note (Signed)
Addended by: Sumner Boast on: 03/20/2019 02:54 PM   Modules accepted: Orders

## 2019-03-21 ENCOUNTER — Ambulatory Visit: Payer: PPO | Admitting: Physical Therapy

## 2019-03-21 ENCOUNTER — Other Ambulatory Visit: Payer: Self-pay

## 2019-03-21 DIAGNOSIS — R296 Repeated falls: Secondary | ICD-10-CM

## 2019-03-21 DIAGNOSIS — R262 Difficulty in walking, not elsewhere classified: Secondary | ICD-10-CM | POA: Diagnosis not present

## 2019-03-21 DIAGNOSIS — M6281 Muscle weakness (generalized): Secondary | ICD-10-CM

## 2019-03-21 DIAGNOSIS — R2689 Other abnormalities of gait and mobility: Secondary | ICD-10-CM

## 2019-03-21 NOTE — Therapy (Signed)
[place], [place], [address] Phone: [phone]   Fax:  [phone]  Physical Therapy Treatment  Patient Details  Name: Stacey Ramirez MRN: [id number] Date of Birth: [birth year] Referring Provider (PT): Pharr   Encounter Date: 03/21/2019  PT End of Session - 03/21/19 1453    Visit Number  21    Number of Visits  16    Authorization - Visit Number  --    Authorization - Number of Visits  --    PT Start Time  4944    PT Stop Time  1446    PT Time Calculation (min)  51 min       Past Medical History:  Diagnosis Date  . Adjustment reaction with anxiety 12/20/2015  . Aortic stenosis   . CAD (coronary artery disease) 06/09/2017   Nuc study 10/18: EF 69, inferolateral perfusion defect-possible small infarct with peri-infarct ischemia; intermediate risk // LHC 10/18: pLAD 25, pLCx 50, pRCA 25 >> med Rx  . Carotid stenosis, asymptomatic, bilateral 02/27/2016   Carotid US 5/17: Bilateral ICA 1-39  . Cerebrovascular accident (stroke) ([employer])    a. 12/2015 - cryptogenic.  S/P MDT Linq.  . Difficulty walking   . Essential hypertension   . Gait disorder 04/07/2016  . Heart palpitations 09/06/2012  . History of CVA with residual deficit 12/14/2015  . History of dizziness   . History of echocardiogram    Echo 10/18: Vigorous LVF, EF 65-70, normal wall motion, grade 1 diastolic dysfunction, GLS -21.1%, mild RAE  . History of nuclear stress test    Nuclear stress test 10/18: EF 69, inf-lat defect c/w poss infarct with peri-infarct ischemia; Intermediate Risk  . Hyperlipidemia   . Hypothyroidism   . Left hemiparesis ([place])   . LVH (left ventricular hypertrophy)   . Mitral regurgitation    a. 12/2015 Echo: EF 60-65%, mild focal basal hypertrophy. No rwma, triv AI, mild MR, mildly dil LA w/o thrombus. No RA thrombus. No PFO.  Marland Kitchen Palpitations   . Prediabetes   . Stroke-like symptom 12/13/2015  . SVT (supraventricular  tachycardia) ([place]) 12/14/2015  . Vascular parkinsonism ([place]) 06/29/2016  . Vitamin D deficiency     Past Surgical History:  Procedure Laterality Date  . CARDIOVASCULAR STRESS TEST  2002   NORMAL  . EP IMPLANTABLE DEVICE N/A 12/17/2015   Procedure: Loop Recorder Insertion;  Surgeon: [name], MD;  Location: [place] [facility];  Service: Cardiovascular;  Laterality: N/A;  . LEFT HEART CATH AND CORONARY ANGIOGRAPHY N/A 05/25/2017   Procedure: LEFT HEART CATH AND CORONARY ANGIOGRAPHY;  Surgeon: Sherren Mocha, MD;  Location: [place] [facility];  Service: Cardiovascular;  Laterality: N/A;  . LOOP RECORDER IMPLANT    . TEE WITHOUT CARDIOVERSION N/A 12/17/2015   Procedure: TRANSESOPHAGEAL ECHOCARDIOGRAM (TEE);  Surgeon: Lelon Perla, MD;  Location: [facility] ENDOSCOPY;  Service: Cardiovascular;  Laterality: N/A;  Pt also needs a LOOP  . TRANSTHORACIC ECHOCARDIOGRAM  2008   SHOWED LEFT VENTRICULAR HYPERTROPHY AND MILD AORTIC STENOSIS    There were no vitals filed for this visit.  Subjective Assessment - 03/21/19 1408    Subjective  no fall since last week. legs are sore alittle. pt presents with compression hose    Currently in Pain?  Yes    Pain Score  3     Pain Location  Leg  [place] Adult PT Treatment/Exercise - 03/21/19 0001      High Level Balance   High Level Balance Activities  Marching forwards;Negotiating over obstacles    High Level Balance Comments  ball toss on airex, step taps       Lumbar Exercises: Aerobic   Nustep  L 5 8 min      Lumbar Exercises: Machines for Strengthening   Cybex Knee Extension  20# 3x10    Cybex Knee Flexion  20#, 25# 1 sets 10, LLE 15# 10x      Lumbar Exercises: Seated   Sit to Stand  20 reps   min A with mod cuing     Knee/Hip Exercises: Seated   Sit to Sand  2 sets;10 reps               PT Short Term Goals - 08/25/18 1153      PT SHORT TERM GOAL #1   Title  independent with her home HEP     Status  Achieved        PT Long Term Goals - 03/09/19 1420      PT LONG TERM GOAL #1   Title  TUG with RW 23.8 sec    Baseline  25.4 with RW    Status  Partially Met      PT LONG TERM GOAL #2   Title  increase Berg balance score to 40/56 for safety    Baseline  38/56 , increased 3 pts    Status  Partially Met      PT LONG TERM GOAL #3   Title  increase LE strength (particularly ABD and ext.) to 4/5 for functional gait and safety    Baseline  4/5 hips and knees tested in sitting except BIL hip flexors 4-/5    Status  Partially Met      PT LONG TERM GOAL #4   Title  be able to do a sit to stand without assistance and without losing balance for functional use    Status  Partially Met            Plan - 03/21/19 1455    Clinical Impression Statement  pt required verbal and tactile cues for toe clearance, stride length, righting responses and leaning forward during sit to stand. pt had multiple LOB during session    PT Treatment/Interventions  ADLs/Self Care Home Management;Electrical Stimulation;Moist Heat;Gait training;Stair training;Therapeutic activities;Functional mobility training;Therapeutic exercise;Patient/family education;Neuromuscular re-education;Balance training    PT Next Visit Plan  continue to work on balance, stride length, and righting responses       Patient will benefit from skilled therapeutic intervention in order to improve the following deficits and impairments:  Abnormal gait, Pain, Postural dysfunction, Increased muscle spasms, Cardiopulmonary status limiting activity, Decreased activity tolerance, Decreased strength, Difficulty walking, Decreased balance  Visit Diagnosis: 1. Muscle weakness (generalized)   2. Difficulty in walking, not elsewhere classified   3. Balance problem   4. Repeated falls        Problem List Patient Active Problem List   Diagnosis Date Noted  . Familial hypercholesteremia 02/21/2018  . Psychogenic gait 10/21/2017   . Coronary artery disease involving native coronary artery of native heart without angina pectoris 06/09/2017  . Abnormal nuclear cardiac imaging test 05/25/2017  . PVC's (premature ventricular contractions) 05/17/2017  . Anxiety 12/29/2016  . Vascular parkinsonism ([place]) 06/29/2016  . Gait disorder 04/07/2016  . Intracranial carotid stenosis, bilateral 02/27/2016  . Essential hypertension   . Left-sided  weakness 12/28/2015  . Headache 12/28/2015  . Adjustment reaction with anxiety 12/20/2015  . Left hemiparesis ([place])   . History of CVA with residual deficit   . Prediabetes   . SVT (supraventricular tachycardia) ([place]) 12/14/2015  . Stroke-like symptom 12/13/2015  . History of stroke   . Heart palpitations 09/06/2012  . Hypertension   . Familial hyperlipidemia   . Hypothyroidism   . Vitamin D deficiency   . Palpitations   . History of dizziness   . LVH (left ventricular hypertrophy)   . Aortic stenosis   . Mitral regurgitation   . SOB (shortness of breath)   . Difficulty walking     Dio Giller,ANGIE PTA 03/21/2019, 3:03 PM  [place], [place], [address] Phone: [phone]   Fax:  [phone]  Name: Stacey Ramirez MRN: [id number] Date of Birth: 1941-06-27

## 2019-03-23 ENCOUNTER — Encounter: Payer: Self-pay | Admitting: Physical Therapy

## 2019-03-23 ENCOUNTER — Ambulatory Visit: Payer: PPO | Admitting: Physical Therapy

## 2019-03-23 ENCOUNTER — Other Ambulatory Visit: Payer: Self-pay

## 2019-03-23 ENCOUNTER — Ambulatory Visit (INDEPENDENT_AMBULATORY_CARE_PROVIDER_SITE_OTHER): Payer: PPO | Admitting: *Deleted

## 2019-03-23 DIAGNOSIS — R2689 Other abnormalities of gait and mobility: Secondary | ICD-10-CM

## 2019-03-23 DIAGNOSIS — I471 Supraventricular tachycardia: Secondary | ICD-10-CM

## 2019-03-23 DIAGNOSIS — M6281 Muscle weakness (generalized): Secondary | ICD-10-CM

## 2019-03-23 DIAGNOSIS — R262 Difficulty in walking, not elsewhere classified: Secondary | ICD-10-CM | POA: Diagnosis not present

## 2019-03-23 LAB — CUP PACEART REMOTE DEVICE CHECK
Date Time Interrogation Session: 20200820201151
Implantable Pulse Generator Implant Date: 20170516

## 2019-03-23 NOTE — Therapy (Signed)
[place], [place], [address] Phone: [phone]   Fax:  [phone]  Physical Therapy Treatment  Patient Details  Name: Stacey Ramirez MRN: [id number] Date of Birth: Dec 15, 1940 Referring Provider (PT): Pharr   Encounter Date: 03/23/2019  PT End of Session - 03/23/19 1441    Visit Number  22    Number of Visits  16    PT Start Time  8325    PT Stop Time  1440    PT Time Calculation (min)  42 min       Past Medical History:  Diagnosis Date  . Adjustment reaction with anxiety 12/20/2015  . Aortic stenosis   . CAD (coronary artery disease) 06/09/2017   Nuc study 10/18: EF 69, inferolateral perfusion defect-possible small infarct with peri-infarct ischemia; intermediate risk // LHC 10/18: pLAD 25, pLCx 50, pRCA 25 >> med Rx  . Carotid stenosis, asymptomatic, bilateral 02/27/2016   Carotid US 5/17: Bilateral ICA 1-39  . Cerebrovascular accident (stroke) ([place])    a. 12/2015 - cryptogenic.  S/P MDT Linq.  . Difficulty walking   . Essential hypertension   . Gait disorder 04/07/2016  . Heart palpitations 09/06/2012  . History of CVA with residual deficit 12/14/2015  . History of dizziness   . History of echocardiogram    Echo 10/18: Vigorous LVF, EF 65-70, normal wall motion, grade 1 diastolic dysfunction, GLS -21.1%, mild RAE  . History of nuclear stress test    Nuclear stress test 10/18: EF 69, inf-lat defect c/w poss infarct with peri-infarct ischemia; Intermediate Risk  . Hyperlipidemia   . Hypothyroidism   . Left hemiparesis ([place])   . LVH (left ventricular hypertrophy)   . Mitral regurgitation    a. 12/2015 Echo: EF 60-65%, mild focal basal hypertrophy. No rwma, triv AI, mild MR, mildly dil LA w/o thrombus. No RA thrombus. No PFO.  Marland Kitchen Palpitations   . Prediabetes   . Stroke-like symptom 12/13/2015  . SVT (supraventricular tachycardia) ([place]) 12/14/2015  . Vascular parkinsonism ([place]) 06/29/2016  .  Vitamin D deficiency     Past Surgical History:  Procedure Laterality Date  . CARDIOVASCULAR STRESS TEST  2002   NORMAL  . EP IMPLANTABLE DEVICE N/A 12/17/2015   Procedure: Loop Recorder Insertion;  Surgeon: [name], MD;  Location: [facility];  Service: Cardiovascular;  Laterality: N/A;  . LEFT HEART CATH AND CORONARY ANGIOGRAPHY N/A 05/25/2017   Procedure: LEFT HEART CATH AND CORONARY ANGIOGRAPHY;  Surgeon: Sherren Mocha, MD;  Location: [facility];  Service: Cardiovascular;  Laterality: N/A;  . LOOP RECORDER IMPLANT    . TEE WITHOUT CARDIOVERSION N/A 12/17/2015   Procedure: TRANSESOPHAGEAL ECHOCARDIOGRAM (TEE);  Surgeon: Lelon Perla, MD;  Location: [facility] ENDOSCOPY;  Service: Cardiovascular;  Laterality: N/A;  Pt also needs a LOOP  . TRANSTHORACIC ECHOCARDIOGRAM  2008   SHOWED LEFT VENTRICULAR HYPERTROPHY AND MILD AORTIC STENOSIS    There were no vitals filed for this visit.  Subjective Assessment - 03/23/19 1358    Subjective  wakes up with hip and back pain at times    Currently in Pain?  No/denies    Pain Score  0-No pain                       [facility] Adult PT Treatment/Exercise - 03/23/19 0001      High Level Balance   High Level Balance Comments  #3  ankle weight hip fllxn, ext, abd, on airex pad      Lumbar Exercises: Aerobic   Stationary Bike  6 min 69fd/3bckwd      Lumbar Exercises: Machines for Strengthening   Cybex Knee Extension  25# 2x10    Cybex Knee Flexion  25# 2x10; LLE 1o reps      Knee/Hip Exercises: Standing   Gait Training  high knees, fwd, backwd l,ateral    Other Standing Knee Exercises  STS 2x10               PT Short Term Goals - 08/25/18 1153      PT SHORT TERM GOAL #1   Title  independent with her home HEP    Status  Achieved        PT Long Term Goals - 03/09/19 1420      PT LONG TERM GOAL #1   Title  TUG with RW 23.8 sec    Baseline  25.4 with RW    Status  Partially Met      PT LONG  TERM GOAL #2   Title  increase Berg balance score to 40/56 for safety    Baseline  38/56 , increased 3 pts    Status  Partially Met      PT LONG TERM GOAL #3   Title  increase LE strength (particularly ABD and ext.) to 4/5 for functional gait and safety    Baseline  4/5 hips and knees tested in sitting except BIL hip flexors 4-/5    Status  Partially Met      PT LONG TERM GOAL #4   Title  be able to do a sit to stand without assistance and without losing balance for functional use    Status  Partially Met            Plan - 03/23/19 1444    Clinical Impression Statement  pt required verbal and tactile cues throughout gait exercises for stride length toe clearance. pt needed physical assist and HHA throughout session. amb out of session with single HHA working on stride and cadeance.       Patient will benefit from skilled therapeutic intervention in order to improve the following deficits and impairments:  Abnormal gait, Pain, Postural dysfunction, Increased muscle spasms, Cardiopulmonary status limiting activity, Decreased activity tolerance, Decreased strength, Difficulty walking, Decreased balance  Visit Diagnosis: Muscle weakness (generalized)  Difficulty in walking, not elsewhere classified  Balance problem     Problem List Patient Active Problem List   Diagnosis Date Noted  . Familial hypercholesteremia 02/21/2018  . Psychogenic gait 10/21/2017  . Coronary artery disease involving native coronary artery of native heart without angina pectoris 06/09/2017  . Abnormal nuclear cardiac imaging test 05/25/2017  . PVC's (premature ventricular contractions) 05/17/2017  . Anxiety 12/29/2016  . Vascular parkinsonism ([place] 06/29/2016  . Gait disorder 04/07/2016  . Intracranial carotid stenosis, bilateral 02/27/2016  . Essential hypertension   . Left-sided weakness 12/28/2015  . Headache 12/28/2015  . Adjustment reaction with anxiety 12/20/2015  . [place] hemiparesis ([place]    . History of CVA with residual deficit   . Prediabetes   . SVT (supraventricular tachycardia) ([place] 12/14/2015  . Stroke-like symptom 12/13/2015  . History of stroke   . Heart palpitations 09/06/2012  . Hypertension   . Familial hyperlipidemia   . Hypothyroidism   . Vitamin D deficiency   . Palpitations   . History of dizziness   . LVH (left ventricular hypertrophy)   .  Aortic stenosis   . Mitral regurgitation   . SOB (shortness of breath)   . Difficulty walking     Odesser Tourangeau,ANGIE PTA 03/23/2019, 3:25 PM  [place], [place], [address] Phone: [phone]   Fax:  [phone]  Name: Stacey Ramirez MRN: [id number] Date of Birth: [birth year]

## 2019-03-28 ENCOUNTER — Encounter: Payer: Self-pay | Admitting: Physical Therapy

## 2019-03-28 ENCOUNTER — Other Ambulatory Visit: Payer: Self-pay

## 2019-03-28 ENCOUNTER — Ambulatory Visit: Payer: PPO | Admitting: Physical Therapy

## 2019-03-28 DIAGNOSIS — R262 Difficulty in walking, not elsewhere classified: Secondary | ICD-10-CM | POA: Diagnosis not present

## 2019-03-28 DIAGNOSIS — M6281 Muscle weakness (generalized): Secondary | ICD-10-CM

## 2019-03-28 DIAGNOSIS — R2689 Other abnormalities of gait and mobility: Secondary | ICD-10-CM

## 2019-03-28 NOTE — Therapy (Signed)
[place], [place], [address] Phone: [phone]   Fax:  [phone]  Physical Therapy Treatment  Patient Details  Name: Stacey Ramirez MRN: [id number] Date of Birth: 06-Nov-1940 Referring Provider (PT): Pharr   Encounter Date: 03/28/2019  PT End of Session - 03/28/19 1453    Visit Number  23    Number of Visits  16    Date for PT Re-Evaluation  04/20/19    PT Start Time  1402    PT Stop Time  1447    PT Time Calculation (min)  45 min    Activity Tolerance  Patient tolerated treatment well    Behavior During Therapy  [facility] for tasks assessed/performed       Past Medical History:  Diagnosis Date  . Adjustment reaction with anxiety 12/20/2015  . Aortic stenosis   . CAD (coronary artery disease) 06/09/2017   Nuc study 10/18: EF 69, inferolateral perfusion defect-possible small infarct with peri-infarct ischemia; intermediate risk // LHC 10/18: pLAD 25, pLCx 50, pRCA 25 >> med Rx  . Carotid stenosis, asymptomatic, bilateral 02/27/2016   Carotid US 5/17: Bilateral ICA 1-39  . Cerebrovascular accident (stroke) ([place])    a. 12/2015 - cryptogenic.  S/P MDT Linq.  . Difficulty walking   . Essential hypertension   . Gait disorder 04/07/2016  . Heart palpitations 09/06/2012  . History of CVA with residual deficit 12/14/2015  . History of dizziness   . History of echocardiogram    Echo 10/18: Vigorous LVF, EF 65-70, normal wall motion, grade 1 diastolic dysfunction, GLS -21.1%, mild RAE  . History of nuclear stress test    Nuclear stress test 10/18: EF 69, inf-lat defect c/w poss infarct with peri-infarct ischemia; Intermediate Risk  . Hyperlipidemia   . Hypothyroidism   . Left hemiparesis ([place])   . LVH (left ventricular hypertrophy)   . Mitral regurgitation    a. 12/2015 Echo: EF 60-65%, mild focal basal hypertrophy. No rwma, triv AI, mild MR, mildly dil LA w/o thrombus. No RA thrombus. No PFO.  Marland Kitchen  Palpitations   . Prediabetes   . Stroke-like symptom 12/13/2015  . SVT (supraventricular tachycardia) ([place]) 12/14/2015  . Vascular parkinsonism ([place]) 06/29/2016  . Vitamin D deficiency     Past Surgical History:  Procedure Laterality Date  . CARDIOVASCULAR STRESS TEST  2002   NORMAL  . EP IMPLANTABLE DEVICE N/A 12/17/2015   Procedure: Loop Recorder Insertion;  Surgeon: [name], MD;  Location: [facility];  Service: Cardiovascular;  Laterality: N/A;  . LEFT HEART CATH AND CORONARY ANGIOGRAPHY N/A 05/25/2017   Procedure: LEFT HEART CATH AND CORONARY ANGIOGRAPHY;  Surgeon: Sherren Mocha, MD;  Location: [facility];  Service: Cardiovascular;  Laterality: N/A;  . LOOP RECORDER IMPLANT    . TEE WITHOUT CARDIOVERSION N/A 12/17/2015   Procedure: TRANSESOPHAGEAL ECHOCARDIOGRAM (TEE);  Surgeon: Lelon Perla, MD;  [facility];  Service: Cardiovascular;  Laterality: N/A;  Pt also needs a LOOP  . TRANSTHORACIC ECHOCARDIOGRAM  2008   SHOWED LEFT VENTRICULAR HYPERTROPHY AND MILD AORTIC STENOSIS    There were no vitals filed for this visit.  Subjective Assessment - 03/28/19 1404    Subjective  pt verb pretty good this morning, tired this afternoon. Left hip kinda hurts so I do not want to pedal    Currently in Pain?  Yes    Pain Score  1     Pain  Location  Hip    Pain Orientation  Left                       [facility] Adult PT Treatment/Exercise - 03/28/19 0001      Ambulation/Gait   Ambulation/Gait  Yes    Ambulation/Gait Assistance  4: Min guard    Ambulation Distance (Feet)  150 Feet    Assistive device  1 person hand held assist      High Level Balance   High Level Balance Activities  Marching forwards;Marching backwards;Backward walking;Side stepping      Lumbar Exercises: Machines for Strengthening   Cybex Knee Extension  20# 2x10   25# too heavy   Cybex Knee Flexion  25# 2x10; LLE 10# 10 reps      Knee/Hip Exercises: Standing    Walking with Sports Cord  20# fwd/bckwd/side stepping both ways x2 max assist    Other Standing Knee Exercises  STSx5               PT Short Term Goals - 08/25/18 1153      PT SHORT TERM GOAL #1   Title  independent with her home HEP    Status  Achieved        PT Long Term Goals - 03/09/19 1420      PT LONG TERM GOAL #1   Title  TUG with RW 23.8 sec    Baseline  25.4 with RW    Status  Partially Met      PT LONG TERM GOAL #2   Title  increase Berg balance score to 40/56 for safety    Baseline  38/56 , increased 3 pts    Status  Partially Met      PT LONG TERM GOAL #3   Title  increase LE strength (particularly ABD and ext.) to 4/5 for functional gait and safety    Baseline  4/5 hips and knees tested in sitting except BIL hip flexors 4-/5    Status  Partially Met      PT LONG TERM GOAL #4   Title  be able to do a sit to stand without assistance and without losing balance for functional use    Status  Partially Met            Plan - 03/28/19 1454    Clinical Impression Statement  Pt. continued to need tactile and verbal cues for proper stride length, toe clearance, and proper form for STS. HHA throughout all activities including gait, and balance. patient with multiple losses of balance requiring assistance.    PT Treatment/Interventions  ADLs/Self Care Home Management;Electrical Stimulation;Moist Heat;Gait training;Stair training;Therapeutic activities;Functional mobility training;Therapeutic exercise;Patient/family education;Neuromuscular re-education;Balance training    PT Next Visit Plan  continue to work on balance, stride length, and righting responses       Patient will benefit from skilled therapeutic intervention in order to improve the following deficits and impairments:  Abnormal gait, Pain, Postural dysfunction, Increased muscle spasms, Cardiopulmonary status limiting activity, Decreased activity tolerance, Decreased strength, Difficulty walking,  Decreased balance  Visit Diagnosis: Muscle weakness (generalized)  Difficulty in walking, not elsewhere classified  Balance problem     Problem List Patient Active Problem List   Diagnosis Date Noted  . Familial hypercholesteremia 02/21/2018  . Psychogenic gait 10/21/2017  . Coronary artery disease involving native coronary artery of native heart without angina pectoris 06/09/2017  . Abnormal nuclear cardiac imaging test 05/25/2017  . PVC's (premature  ventricular contractions) 05/17/2017  . Anxiety 12/29/2016  . Vascular parkinsonism ([place]) 06/29/2016  . Gait disorder 04/07/2016  . Intracranial carotid stenosis, bilateral 02/27/2016  . Essential hypertension   . Left-sided weakness 12/28/2015  . Headache 12/28/2015  . Adjustment reaction with anxiety 12/20/2015  . Left hemiparesis ([place])   . History of CVA with residual deficit   . Prediabetes   . SVT (supraventricular tachycardia) ([place]) 12/14/2015  . Stroke-like symptom 12/13/2015  . History of stroke   . Heart palpitations 09/06/2012  . Hypertension   . Familial hyperlipidemia   . Hypothyroidism   . Vitamin D deficiency   . Palpitations   . History of dizziness   . LVH (left ventricular hypertrophy)   . Aortic stenosis   . Mitral regurgitation   . SOB (shortness of breath)   . Difficulty walking     Darcella Cheshire, SPTA 03/28/2019, 2:57 PM  [place], [place], [address] Phone: [phone]   Fax:  [phone]  Name: KABELLA CASSIDY MRN: [id number] Date of Birth: [birth year]

## 2019-03-30 ENCOUNTER — Ambulatory Visit: Payer: PPO | Admitting: Physical Therapy

## 2019-03-30 DIAGNOSIS — H6123 Impacted cerumen, bilateral: Secondary | ICD-10-CM | POA: Diagnosis not present

## 2019-03-30 NOTE — Progress Notes (Signed)
Carelink Summary Report / Loop Recorder 

## 2019-04-04 ENCOUNTER — Ambulatory Visit: Payer: PPO | Admitting: Physical Therapy

## 2019-04-05 DIAGNOSIS — R531 Weakness: Secondary | ICD-10-CM | POA: Diagnosis not present

## 2019-04-05 DIAGNOSIS — M546 Pain in thoracic spine: Secondary | ICD-10-CM | POA: Diagnosis not present

## 2019-04-05 DIAGNOSIS — R292 Abnormal reflex: Secondary | ICD-10-CM | POA: Diagnosis not present

## 2019-04-05 DIAGNOSIS — R269 Unspecified abnormalities of gait and mobility: Secondary | ICD-10-CM | POA: Diagnosis not present

## 2019-04-05 DIAGNOSIS — M542 Cervicalgia: Secondary | ICD-10-CM | POA: Diagnosis not present

## 2019-04-06 ENCOUNTER — Ambulatory Visit: Payer: PPO | Admitting: Physical Therapy

## 2019-04-06 DIAGNOSIS — N390 Urinary tract infection, site not specified: Secondary | ICD-10-CM | POA: Diagnosis not present

## 2019-04-11 ENCOUNTER — Other Ambulatory Visit: Payer: Self-pay

## 2019-04-11 ENCOUNTER — Encounter: Payer: Self-pay | Admitting: Physical Therapy

## 2019-04-11 ENCOUNTER — Ambulatory Visit: Payer: PPO | Attending: Specialist | Admitting: Physical Therapy

## 2019-04-11 DIAGNOSIS — R262 Difficulty in walking, not elsewhere classified: Secondary | ICD-10-CM | POA: Diagnosis not present

## 2019-04-11 DIAGNOSIS — M6281 Muscle weakness (generalized): Secondary | ICD-10-CM

## 2019-04-11 DIAGNOSIS — R296 Repeated falls: Secondary | ICD-10-CM | POA: Insufficient documentation

## 2019-04-11 DIAGNOSIS — R2689 Other abnormalities of gait and mobility: Secondary | ICD-10-CM | POA: Insufficient documentation

## 2019-04-11 NOTE — Therapy (Signed)
[place], [place], [address] Phone: [phone]   Fax:  [phone]  Physical Therapy Treatment  Patient Details  Name: Stacey Ramirez MRN: [id number] Date of Birth: 10/02/1940 Referring Provider (PT): Pharr   Encounter Date: 04/11/2019  PT End of Session - 04/11/19 1445    Visit Number  24    Number of Visits  16    Date for PT Re-Evaluation  04/20/19    PT Start Time  [id number]    PT Stop Time  1443    PT Time Calculation (min)  50 min       Past Medical History:  Diagnosis Date  . Adjustment reaction with anxiety 12/20/2015  . Aortic stenosis   . CAD (coronary artery disease) 06/09/2017   Nuc study 10/18: EF 69, inferolateral perfusion defect-possible small infarct with peri-infarct ischemia; intermediate risk // LHC 10/18: pLAD 25, pLCx 50, pRCA 25 >> med Rx  . Carotid stenosis, asymptomatic, bilateral 02/27/2016   Carotid US 5/17: Bilateral ICA 1-39  . Cerebrovascular accident (stroke) ([place])    a. 12/2015 - cryptogenic.  S/P MDT Linq.  . Difficulty walking   . Essential hypertension   . Gait disorder 04/07/2016  . Heart palpitations 09/06/2012  . History of CVA with residual deficit 12/14/2015  . History of dizziness   . History of echocardiogram    Echo 10/18: Vigorous LVF, EF 65-70, normal wall motion, grade 1 diastolic dysfunction, GLS -21.1%, mild RAE  . History of nuclear stress test    Nuclear stress test 10/18: EF 69, inf-lat defect c/w poss infarct with peri-infarct ischemia; Intermediate Risk  . Hyperlipidemia   . Hypothyroidism   . Left hemiparesis ([place])   . LVH (left ventricular hypertrophy)   . Mitral regurgitation    a. 12/2015 Echo: EF 60-65%, mild focal basal hypertrophy. No rwma, triv AI, mild MR, mildly dil LA w/o thrombus. No RA thrombus. No PFO.  Marland Kitchen Palpitations   . Prediabetes   . Stroke-like symptom 12/13/2015  . SVT (supraventricular tachycardia) ([place]) 12/14/2015  .  Vascular parkinsonism ([place]) 06/29/2016  . Vitamin D deficiency     Past Surgical History:  Procedure Laterality Date  . CARDIOVASCULAR STRESS TEST  2002   NORMAL  . EP IMPLANTABLE DEVICE N/A 12/17/2015   Procedure: Loop Recorder Insertion;  Surgeon: [name], MD;  Location: [facility];  Service: Cardiovascular;  Laterality: N/A;  . LEFT HEART CATH AND CORONARY ANGIOGRAPHY N/A 05/25/2017   Procedure: LEFT HEART CATH AND CORONARY ANGIOGRAPHY;  Surgeon: Sherren Mocha, MD;  Location: [facility];  Service: Cardiovascular;  Laterality: N/A;  . LOOP RECORDER IMPLANT    . TEE WITHOUT CARDIOVERSION N/A 12/17/2015   Procedure: TRANSESOPHAGEAL ECHOCARDIOGRAM (TEE);  Surgeon: Lelon Perla, MD;  Location: [facility] ENDOSCOPY;  Service: Cardiovascular;  Laterality: N/A;  Pt also needs a LOOP  . TRANSTHORACIC ECHOCARDIOGRAM  2008   SHOWED LEFT VENTRICULAR HYPERTROPHY AND MILD AORTIC STENOSIS    There were no vitals filed for this visit.  Subjective Assessment - 04/11/19 1352    Subjective  pt hasnt been seen in two weeks due to ear infection and doctors appointments. husband verbalizes that patient has been inactive, denies falls, but notices general weakness increasing. pt feels a little uncomrfotable feeling L hip    Currently in Pain?  No/denies    Pain Score  0-No pain  [place] Adult PT Treatment/Exercise - 04/11/19 0001      Ambulation/Gait   Ambulation/Gait  Yes    Ambulation/Gait Assistance  4: Min guard    Ambulation Distance (Feet)  200 Feet    Assistive device  1 person hand held assist      Berg Balance Test   Sit to Stand  Able to stand  independently using hands    Standing Unsupported  Able to stand safely 2 minutes    Sitting with Back Unsupported but Feet Supported on Floor or Stool  Able to sit safely and securely 2 minutes    Stand to Sit  Controls descent by using hands    Transfers  Able to transfer safely, definite need  of hands    Standing Unsupported with Eyes Closed  Able to stand 10 seconds safely    Standing Ubsupported with Feet Together  Able to place feet together independently and stand 1 minute safely    From Standing, Reach Forward with Outstretched Arm  Can reach confidently >25 cm (10")    From Standing Position, Pick up Object from Floor  Able to pick up shoe, needs supervision    From Standing Position, Turn to Look Behind Over each Shoulder  Looks behind from both sides and weight shifts well    Turn 360 Degrees  Needs close supervision or verbal cueing    Standing Unsupported, Alternately Place Feet on Step/Stool  Able to complete >2 steps/needs minimal assist    Standing Unsupported, One Foot in Front  Needs help to step but can hold 15 seconds    Standing on One Leg  Tries to lift leg/unable to hold 3 seconds but remains standing independently    Total Score  40      High Level Balance   High Level Balance Activities  Marching forwards cues to get knees up     Lumbar Exercises: Machines for Strengthening   Cybex Knee Extension  15# 2x10 20# too heavy   Cybex Knee Flexion  20# 2x10 25# too heavy              PT Short Term Goals - 08/25/18 1153      PT SHORT TERM GOAL #1   Title  independent with her home HEP    Status  Achieved        PT Long Term Goals - 04/11/19 1452      PT LONG TERM GOAL #1   Status  On-going            Plan - 04/11/19 1449    Clinical Impression Statement  Patient's progress has been hindered due to the two week absence. The berg balance went up by 2 points however the TUG time increased, patient was less trusting during exercises activley looking for something or someone to hold on to, functional endurance also declined evident throughout session.    PT Treatment/Interventions  ADLs/Self Care Home Management;Electrical Stimulation;Moist Heat;Gait training;Stair training;Therapeutic activities;Functional mobility training;Therapeutic  exercise;Patient/family education;Neuromuscular re-education;Balance training    PT Next Visit Plan  continue to work on balance, stride length, and righting responses       Patient will benefit from skilled therapeutic intervention in order to improve the following deficits and impairments:  Abnormal gait, Pain, Postural dysfunction, Increased muscle spasms, Cardiopulmonary status limiting activity, Decreased activity tolerance, Decreased strength, Difficulty walking, Decreased balance  Visit Diagnosis: Muscle weakness (generalized)  Difficulty in walking, not elsewhere classified  Balance problem  Problem List Patient Active Problem List   Diagnosis Date Noted  . Familial hypercholesteremia 02/21/2018  . Psychogenic gait 10/21/2017  . Coronary artery disease involving native coronary artery of native heart without angina pectoris 06/09/2017  . Abnormal nuclear cardiac imaging test 05/25/2017  . PVC's (premature ventricular contractions) 05/17/2017  . Anxiety 12/29/2016  . Vascular parkinsonism ([place]) 06/29/2016  . Gait disorder 04/07/2016  . Intracranial carotid stenosis, bilateral 02/27/2016  . Essential hypertension   . Left-sided weakness 12/28/2015  . Headache 12/28/2015  . Adjustment reaction with anxiety 12/20/2015  . Left hemiparesis ([place])   . History of CVA with residual deficit   . Prediabetes   . SVT (supraventricular tachycardia) ([place]) 12/14/2015  . Stroke-like symptom 12/13/2015  . History of stroke   . Heart palpitations 09/06/2012  . Hypertension   . Familial hyperlipidemia   . Hypothyroidism   . Vitamin D deficiency   . Palpitations   . History of dizziness   . LVH (left ventricular hypertrophy)   . Aortic stenosis   . Mitral regurgitation   . SOB (shortness of breath)   . Difficulty walking     Darcella Cheshire, SPTA 04/11/2019, 2:53 PM  [place], [place],  [address] Phone: [phone]   Fax:  [phone]  Name: Stacey Ramirez MRN: [id number] Date of Birth: 1940-08-27

## 2019-04-12 DIAGNOSIS — G20 Parkinson's disease: Secondary | ICD-10-CM | POA: Diagnosis not present

## 2019-04-12 DIAGNOSIS — M810 Age-related osteoporosis without current pathological fracture: Secondary | ICD-10-CM | POA: Diagnosis not present

## 2019-04-12 DIAGNOSIS — M4856XD Collapsed vertebra, not elsewhere classified, lumbar region, subsequent encounter for fracture with routine healing: Secondary | ICD-10-CM | POA: Diagnosis not present

## 2019-04-12 DIAGNOSIS — R269 Unspecified abnormalities of gait and mobility: Secondary | ICD-10-CM | POA: Diagnosis not present

## 2019-04-12 DIAGNOSIS — M549 Dorsalgia, unspecified: Secondary | ICD-10-CM | POA: Diagnosis not present

## 2019-04-12 DIAGNOSIS — S32000S Wedge compression fracture of unspecified lumbar vertebra, sequela: Secondary | ICD-10-CM | POA: Diagnosis not present

## 2019-04-13 ENCOUNTER — Ambulatory Visit: Payer: PPO | Admitting: Physical Therapy

## 2019-04-13 ENCOUNTER — Other Ambulatory Visit: Payer: Self-pay

## 2019-04-13 ENCOUNTER — Encounter: Payer: Self-pay | Admitting: Physical Therapy

## 2019-04-13 DIAGNOSIS — M6281 Muscle weakness (generalized): Secondary | ICD-10-CM | POA: Diagnosis not present

## 2019-04-13 DIAGNOSIS — R2689 Other abnormalities of gait and mobility: Secondary | ICD-10-CM

## 2019-04-13 DIAGNOSIS — R262 Difficulty in walking, not elsewhere classified: Secondary | ICD-10-CM

## 2019-04-13 NOTE — Therapy (Cosign Needed)
[place], [place], [address] Phone: [phone]   Fax:  [phone]  Physical Therapy Treatment  Patient Details  Name: Stacey Ramirez MRN: [id number] Date of Birth: Jan 31, 1941 Referring Provider (PT): Pharr   Encounter Date: 04/13/2019  PT End of Session - 04/13/19 1457    Visit Number  25    Number of Visits  16    Date for PT Re-Evaluation  04/20/19    PT Start Time  [id number]    PT Stop Time  1443    PT Time Calculation (min)  49 min    Activity Tolerance  Patient tolerated treatment well    Behavior During Therapy  [facility] for tasks assessed/performed       Past Medical History:  Diagnosis Date  . Adjustment reaction with anxiety 12/20/2015  . Aortic stenosis   . CAD (coronary artery disease) 06/09/2017   Nuc study 10/18: EF 69, inferolateral perfusion defect-possible small infarct with peri-infarct ischemia; intermediate risk // LHC 10/18: pLAD 25, pLCx 50, pRCA 25 >> med Rx  . Carotid stenosis, asymptomatic, bilateral 02/27/2016   Carotid US 5/17: Bilateral ICA 1-39  . Cerebrovascular accident (stroke) ([place])    a. 12/2015 - cryptogenic.  S/P MDT Linq.  . Difficulty walking   . Essential hypertension   . Gait disorder 04/07/2016  . Heart palpitations 09/06/2012  . History of CVA with residual deficit 12/14/2015  . History of dizziness   . History of echocardiogram    Echo 10/18: Vigorous LVF, EF 65-70, normal wall motion, grade 1 diastolic dysfunction, GLS -21.1%, mild RAE  . History of nuclear stress test    Nuclear stress test 10/18: EF 69, inf-lat defect c/w poss infarct with peri-infarct ischemia; Intermediate Risk  . Hyperlipidemia   . Hypothyroidism   . Left hemiparesis ([place])   . LVH (left ventricular hypertrophy)   . Mitral regurgitation    a. 12/2015 Echo: EF 60-65%, mild focal basal hypertrophy. No rwma, triv AI, mild MR, mildly dil LA w/o thrombus. No RA thrombus. No PFO.  Marland Kitchen  Palpitations   . Prediabetes   . Stroke-like symptom 12/13/2015  . SVT (supraventricular tachycardia) ([place]) 12/14/2015  . 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 Vitamin D deficiency     Past Surgical History:  Procedure Laterality Date  . CARDIOVASCULAR STRESS TEST  2002   NORMAL  . EP IMPLANTABLE DEVICE N/A 12/17/2015   Procedure: Loop Recorder Insertion;  Surgeon: [name], MD;  Location: [facility];  Service: Cardiovascular;  Laterality: N/A;  . LEFT HEART CATH AND CORONARY ANGIOGRAPHY N/A 05/25/2017   Procedure: LEFT HEART CATH AND CORONARY ANGIOGRAPHY;  Surgeon: Sherren Mocha, MD;  Location: [facility];  Service: Cardiovascular;  Laterality: N/A;  . LOOP RECORDER IMPLANT    . TEE WITHOUT CARDIOVERSION N/A 12/17/2015   Procedure: TRANSESOPHAGEAL ECHOCARDIOGRAM (TEE);  Surgeon: Lelon Perla, MD;  Location: [facility] ENDOSCOPY;  Service: Cardiovascular;  Laterality: N/A;  Pt also needs a LOOP  . TRANSTHORACIC ECHOCARDIOGRAM  2008   SHOWED LEFT VENTRICULAR HYPERTROPHY AND MILD AORTIC STENOSIS    There were no vitals filed for this visit.  Subjective Assessment - 04/13/19 1350    Subjective  feeling good today, but feeling light headed and tired.    Pain Score  0-No pain                       [facility]  Adult PT Treatment/Exercise - 04/13/19 0001      Ambulation/Gait   Ambulation/Gait  Yes    Ambulation/Gait Assistance  4: Min guard    Ambulation Distance (Feet)  200 Feet    Assistive device  1 person hand held assist    Gait Comments  cross stepping      Knee/Hip Exercises: Standing   Other Standing Knee Exercises  Standing pivot from L to Rt ball toss   2 clinicians   Other Standing Knee Exercises  cone taps BIL   heavy cuing to obtain pt. compliance     Knee/Hip Exercises: Seated   Sit to Sand  5 reps;with UE support               PT Short Term Goals - 08/25/18 1153      PT SHORT TERM GOAL #1   Title  independent with her  home HEP    Status  Achieved        PT Long Term Goals - 04/11/19 1452      PT LONG TERM GOAL #1   Status  On-going            Plan - 04/13/19 1459    Clinical Impression Statement  Pt. tolerated treatment fairly well however was visibly fatigued throughout the session. pt. needed heavy verbale and tactile cues throughout session for proper movement, compliance, and encouragement. focused on exercises to widen stance during gait, and turn around safer and more efficient. Pt is limited by fatigue, weakness, and lack of faith in her abilities.    PT Treatment/Interventions  ADLs/Self Care Home Management;Electrical Stimulation;Moist Heat;Gait training;Stair training;Therapeutic activities;Functional mobility training;Therapeutic exercise;Patient/family education;Neuromuscular re-education;Balance training    PT Next Visit Plan  continue to work on balance, stride length, and righting responses       Patient will benefit from skilled therapeutic intervention in order to improve the following deficits and impairments:  Abnormal gait, Pain, Postural dysfunction, Increased muscle spasms, Cardiopulmonary status limiting activity, Decreased activity tolerance, Decreased strength, Difficulty walking, Decreased balance  Visit Diagnosis: Muscle weakness (generalized)  Difficulty in walking, not elsewhere classified  Balance problem     Problem List Patient Active Problem List   Diagnosis Date Noted  . Familial hypercholesteremia 02/21/2018  . Psychogenic gait 10/21/2017  . Coronary artery disease involving native coronary artery of native heart without angina pectoris 06/09/2017  . Abnormal nuclear cardiac imaging test 05/25/2017  . PVC's (premature ventricular contractions) 05/17/2017  . Anxiety 12/29/2016  . 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 Gait disorder 04/07/2016  . Intracranial carotid stenosis, bilateral 02/27/2016  . Essential hypertension   . Left-sided  weakness 12/28/2015  . Headache 12/28/2015  . Adjustment reaction with anxiety 12/20/2015  . Left hemiparesis ([place])   . History of CVA with residual deficit   . Prediabetes   . SVT (supraventricular tachycardia) ([place]) 12/14/2015  . Stroke-like symptom 12/13/2015  . History of stroke   . Heart palpitations 09/06/2012  . Hypertension   . Familial hyperlipidemia   . Hypothyroidism   . Vitamin D deficiency   . Palpitations   . History of dizziness   . LVH (left ventricular hypertrophy)   . Aortic stenosis   . Mitral regurgitation   . SOB (shortness of breath)   . Difficulty walking     Darcella Cheshire, SPTA 04/13/2019, 3:17 PM  [place], [place], [address] Phone: [phone]   Fax:  [phone]  Name: Stacey Ramirez MRN: [id number] Date of Birth: 07/16/1941

## 2019-04-13 NOTE — Therapy (Signed)
[place], [place], [address] Phone: [phone]   Fax:  [phone]  Physical Therapy Treatment  Patient Details  Name: Stacey Ramirez MRN: [id number] Date of Birth: Apr 23, 1941 Referring Provider (PT): Pharr   Encounter Date: 04/13/2019  PT End of Session - 04/13/19 1457    Visit Number  25    Number of Visits  16    Date for PT Re-Evaluation  04/20/19    PT Start Time  [id number]    PT Stop Time  1443    PT Time Calculation (min)  49 min    Activity Tolerance  Patient tolerated treatment well    Behavior During Therapy  [facility] for tasks assessed/performed       Past Medical History:  Diagnosis Date  . Adjustment reaction with anxiety 12/20/2015  . Aortic stenosis   . CAD (coronary artery disease) 06/09/2017   Nuc study 10/18: EF 69, inferolateral perfusion defect-possible small infarct with peri-infarct ischemia; intermediate risk // LHC 10/18: pLAD 25, pLCx 50, pRCA 25 >> med Rx  . Carotid stenosis, asymptomatic, bilateral 02/27/2016   Carotid US 5/17: Bilateral ICA 1-39  . Cerebrovascular accident (stroke) ([place])    a. 12/2015 - cryptogenic.  S/P MDT Linq.  . Difficulty walking   . Essential hypertension   . Gait disorder 04/07/2016  . Heart palpitations 09/06/2012  . History of CVA with residual deficit 12/14/2015  . History of dizziness   . History of echocardiogram    Echo 10/18: Vigorous LVF, EF 65-70, normal wall motion, grade 1 diastolic dysfunction, GLS -21.1%, mild RAE  . History of nuclear stress test    Nuclear stress test 10/18: EF 69, inf-lat defect c/w poss infarct with peri-infarct ischemia; Intermediate Risk  . Hyperlipidemia   . Hypothyroidism   . Left hemiparesis ([place])   . LVH (left ventricular hypertrophy)   . Mitral regurgitation    a. 12/2015 Echo: EF 60-65%, mild focal basal hypertrophy. No rwma, triv AI, mild MR, mildly dil LA w/o thrombus. No RA thrombus. No PFO.  Marland Kitchen  Palpitations   . Prediabetes   . Stroke-like symptom 12/13/2015  . SVT (supraventricular tachycardia) ([place]) 12/14/2015  . 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 Vitamin D deficiency     Past Surgical History:  Procedure Laterality Date  . CARDIOVASCULAR STRESS TEST  2002   NORMAL  . EP IMPLANTABLE DEVICE N/A 12/17/2015   Procedure: Loop Recorder Insertion;  Surgeon: [name], MD;  Location: [facility];  Service: Cardiovascular;  Laterality: N/A;  . LEFT HEART CATH AND CORONARY ANGIOGRAPHY N/A 05/25/2017   Procedure: LEFT HEART CATH AND CORONARY ANGIOGRAPHY;  Surgeon: Sherren Mocha, MD;  Location: [facility];  Service: Cardiovascular;  Laterality: N/A;  . LOOP RECORDER IMPLANT    . TEE WITHOUT CARDIOVERSION N/A 12/17/2015   Procedure: TRANSESOPHAGEAL ECHOCARDIOGRAM (TEE);  Surgeon: Lelon Perla, MD;  Location: [facility] ENDOSCOPY;  Service: Cardiovascular;  Laterality: N/A;  Pt also needs a LOOP  . TRANSTHORACIC ECHOCARDIOGRAM  2008   SHOWED LEFT VENTRICULAR HYPERTROPHY AND MILD AORTIC STENOSIS    There were no vitals filed for this visit.  Subjective Assessment - 04/13/19 1350    Subjective  feeling good today, but feeling light headed and tired.    Pain Score  0-No pain                       [facility]  Adult PT Treatment/Exercise - 04/13/19 0001      Ambulation/Gait   Ambulation/Gait  Yes    Ambulation/Gait Assistance  4: Min guard    Ambulation Distance (Feet)  200 Feet    Assistive device  1 person hand held assist    Gait Comments  marching/high knees      High Level Balance   High Level Balance Activities  Direction changes;Negotitating around obstacles;Negotiating over obstacles      Knee/Hip Exercises: Standing   Other Standing Knee Exercises  Standing pivot from L to Rt ball toss   2 clinicians   Other Standing Knee Exercises  cone taps BIL   heavy cuing to obtain pt. compliance     Knee/Hip Exercises: Seated   Sit to Sand  5  reps;with UE support               PT Short Term Goals - 08/25/18 1153      PT SHORT TERM GOAL #1   Title  independent with her home HEP    Status  Achieved        PT Long Term Goals - 04/11/19 1452      PT LONG TERM GOAL #1   Status  On-going            Plan - 04/13/19 1459    Clinical Impression Statement  Pt. tolerated treatment fairly well however was visibly fatigued throughout the session. pt. needed heavy verbale and tactile cues throughout session for proper movement, compliance, and encouragement. focused on exercises to widen stance during gait, and turn around more efficiently.    PT Treatment/Interventions  ADLs/Self Care Home Management;Electrical Stimulation;Moist Heat;Gait training;Stair training;Therapeutic activities;Functional mobility training;Therapeutic exercise;Patient/family education;Neuromuscular re-education;Balance training    PT Next Visit Plan  continue to work on balance, stride length, and righting responses       Patient will benefit from skilled therapeutic intervention in order to improve the following deficits and impairments:  Abnormal gait, Pain, Postural dysfunction, Increased muscle spasms, Cardiopulmonary status limiting activity, Decreased activity tolerance, Decreased strength, Difficulty walking, Decreased balance  Visit Diagnosis: Muscle weakness (generalized)  Difficulty in walking, not elsewhere classified  Balance problem     Problem List Patient Active Problem List   Diagnosis Date Noted  . Familial hypercholesteremia 02/21/2018  . Psychogenic gait 10/21/2017  . Coronary artery disease involving native coronary artery of native heart without angina pectoris 06/09/2017  . Abnormal nuclear cardiac imaging test 05/25/2017  . PVC's (premature ventricular contractions) 05/17/2017  . Anxiety 12/29/2016  . 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 Gait disorder 04/07/2016  . Intracranial carotid stenosis,  bilateral 02/27/2016  . Essential hypertension   . Left-sided weakness 12/28/2015  . Headache 12/28/2015  . Adjustment reaction with anxiety 12/20/2015  . Left hemiparesis ([facility])   . History of CVA with residual deficit   . Prediabetes   . SVT (supraventricular tachycardia) ([place]) 12/14/2015  . Stroke-like symptom 12/13/2015  . History of stroke   . Heart palpitations 09/06/2012  . Hypertension   . Familial hyperlipidemia   . Hypothyroidism   . Vitamin D deficiency   . Palpitations   . History of dizziness   . LVH (left ventricular hypertrophy)   . Aortic stenosis   . Mitral regurgitation   . SOB (shortness of breath)   . Difficulty walking   pt with several LOB requiring assistance to prevent fall John Riggers SPTA Keontre Defino,ANGIE PTA 04/13/2019, 3:41 PM  [place], [place], [address] Phone: [phone]   Fax:  [phone]  Name: AASHIKA LITTLEJOHN MRN: [id number] Date of Birth: [birth year]

## 2019-04-17 DIAGNOSIS — J310 Chronic rhinitis: Secondary | ICD-10-CM | POA: Diagnosis not present

## 2019-04-17 DIAGNOSIS — M26622 Arthralgia of left temporomandibular joint: Secondary | ICD-10-CM | POA: Diagnosis not present

## 2019-04-18 ENCOUNTER — Other Ambulatory Visit: Payer: Self-pay

## 2019-04-18 ENCOUNTER — Encounter: Payer: Self-pay | Admitting: Physical Therapy

## 2019-04-18 ENCOUNTER — Ambulatory Visit: Payer: PPO | Admitting: Physical Therapy

## 2019-04-18 DIAGNOSIS — M6281 Muscle weakness (generalized): Secondary | ICD-10-CM

## 2019-04-18 DIAGNOSIS — R2689 Other abnormalities of gait and mobility: Secondary | ICD-10-CM

## 2019-04-18 DIAGNOSIS — R262 Difficulty in walking, not elsewhere classified: Secondary | ICD-10-CM

## 2019-04-18 NOTE — Therapy (Signed)
[place], [place], [address] Phone: [phone]   Fax:  [phone]  Physical Therapy Treatment  Patient Details  Name: Stacey Ramirez MRN: [id number] Date of Birth: 1941/05/28 Referring Provider (PT): Pharr   Encounter Date: 04/18/2019  PT End of Session - 04/18/19 1445    Visit Number  26    Number of Visits  16    Date for PT Re-Evaluation  04/20/19    PT Start Time  1400    PT Stop Time  1444    PT Time Calculation (min)  44 min    Activity Tolerance  Patient tolerated treatment well    Behavior During Therapy  [facility] for tasks assessed/performed       Past Medical History:  Diagnosis Date  . Adjustment reaction with anxiety 12/20/2015  . Aortic stenosis   . CAD (coronary artery disease) 06/09/2017   Nuc study 10/18: EF 69, inferolateral perfusion defect-possible small infarct with peri-infarct ischemia; intermediate risk // LHC 10/18: pLAD 25, pLCx 50, pRCA 25 >> med Rx  . Carotid stenosis, asymptomatic, bilateral 02/27/2016   Carotid US 5/17: Bilateral ICA 1-39  . Cerebrovascular accident (stroke) ([place])    a. 12/2015 - cryptogenic.  S/P MDT Linq.  . Difficulty walking   . Essential hypertension   . Gait disorder 04/07/2016  . Heart palpitations 09/06/2012  . History of CVA with residual deficit 12/14/2015  . History of dizziness   . History of echocardiogram    Echo 10/18: Vigorous LVF, EF 65-70, normal wall motion, grade 1 diastolic dysfunction, GLS -21.1%, mild RAE  . History of nuclear stress test    Nuclear stress test 10/18: EF 69, inf-lat defect c/w poss infarct with peri-infarct ischemia; Intermediate Risk  . Hyperlipidemia   . Hypothyroidism   . Left hemiparesis ([place])   . LVH (left ventricular hypertrophy)   . Mitral regurgitation    a. 12/2015 Echo: EF 60-65%, mild focal basal hypertrophy. No rwma, triv AI, mild MR, mildly dil LA w/o thrombus. No RA thrombus. No PFO.  Marland Kitchen  Palpitations   . Prediabetes   . Stroke-like symptom 12/13/2015  . SVT (supraventricular tachycardia) ([place]) 12/14/2015  . 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 Vitamin D deficiency     Past Surgical History:  Procedure Laterality Date  . CARDIOVASCULAR STRESS TEST  2002   NORMAL  . EP IMPLANTABLE DEVICE N/A 12/17/2015   Procedure: Loop Recorder Insertion;  Surgeon: [name], MD;  Location: [facility];  Service: Cardiovascular;  Laterality: N/A;  . LEFT HEART CATH AND CORONARY ANGIOGRAPHY N/A 05/25/2017   Procedure: LEFT HEART CATH AND CORONARY ANGIOGRAPHY;  Surgeon: Sherren Mocha, MD;  Location: [facility];  Service: Cardiovascular;  Laterality: N/A;  . LOOP RECORDER IMPLANT    . TEE WITHOUT CARDIOVERSION N/A 12/17/2015   Procedure: TRANSESOPHAGEAL ECHOCARDIOGRAM (TEE);  Surgeon: Lelon Perla, MD;  Location: [facility] ENDOSCOPY;  Service: Cardiovascular;  Laterality: N/A;  Pt also needs a LOOP  . TRANSTHORACIC ECHOCARDIOGRAM  2008   SHOWED LEFT VENTRICULAR HYPERTROPHY AND MILD AORTIC STENOSIS    There were no vitals filed for this visit.  Subjective Assessment - 04/18/19 1403    Subjective  feeling great, not aching as bad today. hasnt noticed feeling lightheaded    Currently in Pain?  No/denies    Pain Score  0-No pain  [facility] Adult PT Treatment/Exercise - 04/18/19 0001      Ambulation/Gait   Gait Comments  gait in circle around gym x4      High Level Balance   High Level Balance Activities  Direction changes;Negotitating around obstacles;Negotiating over obstacles LOB x2     Lumbar Exercises: Aerobic   Nustep  lvl 4 87min      Lumbar Exercises: Machines for Strengthening   Cybex Knee Extension  15# 2x10    Cybex Knee Flexion  20# 2x10      Knee/Hip Exercises: Standing   Other Standing Knee Exercises  Standing pivot from L to Rt ball toss   LOB x3     Knee/Hip Exercises: Seated   Sit to Sand  5 reps;with UE  support no tactile cues required              PT Short Term Goals - 08/25/18 1153      PT SHORT TERM GOAL #1   Title  independent with her home HEP    Status  Achieved        PT Long Term Goals - 04/11/19 1452      PT LONG TERM GOAL #1   Status  On-going            Plan - 04/18/19 1445    Clinical Impression Statement  pt. tolerated treatment well although fatigue was visible. pt. needed heavy verbal and tactile cues throughout session for proper movement. repeated exercises encouraging widening of stance and turning around safer and quicker. several LOB present throughout exercises moving over obstacles and pivoting    PT Treatment/Interventions  ADLs/Self Care Home Management;Electrical Stimulation;Moist Heat;Gait training;Stair training;Therapeutic activities;Functional mobility training;Therapeutic exercise;Patient/family education;Neuromuscular re-education;Balance training    PT Next Visit Plan  continue to work on balance, stride length, and righting responses       Patient will benefit from skilled therapeutic intervention in order to improve the following deficits and impairments:  Abnormal gait, Pain, Postural dysfunction, Increased muscle spasms, Cardiopulmonary status limiting activity, Decreased activity tolerance, Decreased strength, Difficulty walking, Decreased balance  Visit Diagnosis: Muscle weakness (generalized)  Difficulty in walking, not elsewhere classified  Balance problem     Problem List Patient Active Problem List   Diagnosis Date Noted  . Familial hypercholesteremia 02/21/2018  . Psychogenic gait 10/21/2017  . Coronary artery disease involving native coronary artery of native heart without angina pectoris 06/09/2017  . Abnormal nuclear cardiac imaging test 05/25/2017  . PVC's (premature ventricular contractions) 05/17/2017  . Anxiety 12/29/2016  . 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 Gait disorder 04/07/2016  .  Intracranial carotid stenosis, bilateral 02/27/2016  . Essential hypertension   . Left-sided weakness 12/28/2015  . Headache 12/28/2015  . Adjustment reaction with anxiety 12/20/2015  . Left hemiparesis ([place])   . History of CVA with residual deficit   . Prediabetes   . SVT (supraventricular tachycardia) ([place]) 12/14/2015  . Stroke-like symptom 12/13/2015  . History of stroke   . Heart palpitations 09/06/2012  . Hypertension   . Familial hyperlipidemia   . Hypothyroidism   . Vitamin D deficiency   . Palpitations   . History of dizziness   . LVH (left ventricular hypertrophy)   . Aortic stenosis   . Mitral regurgitation   . SOB (shortness of breath)   . Difficulty walking     Darcella Cheshire, SPTA 04/18/2019, 2:51 PM  [place], [place],  [id number] Phone: [phone]   Fax:  [phone]  Name: Stacey Ramirez MRN: [id number] Date of Birth: [birth year]

## 2019-04-20 ENCOUNTER — Ambulatory Visit: Payer: PPO | Admitting: Physical Therapy

## 2019-04-20 ENCOUNTER — Other Ambulatory Visit: Payer: Self-pay

## 2019-04-20 DIAGNOSIS — R2689 Other abnormalities of gait and mobility: Secondary | ICD-10-CM

## 2019-04-20 DIAGNOSIS — R262 Difficulty in walking, not elsewhere classified: Secondary | ICD-10-CM

## 2019-04-20 DIAGNOSIS — R296 Repeated falls: Secondary | ICD-10-CM

## 2019-04-20 DIAGNOSIS — M6281 Muscle weakness (generalized): Secondary | ICD-10-CM | POA: Diagnosis not present

## 2019-04-20 NOTE — Therapy (Signed)
[place], [place], [address] Phone: [phone]   Fax:  [phone]  Physical Therapy Treatment  Patient Details  Name: Stacey Ramirez MRN: [id number] Date of Birth: 1941-04-27 Referring Provider (PT): Pharr   Encounter Date: 04/20/2019  PT End of Session - 04/20/19 1441    Visit Number  27    Date for PT Re-Evaluation  05/19/19    PT Start Time  1355    PT Stop Time  1445    PT Time Calculation (min)  50 min       Past Medical History:  Diagnosis Date  . Adjustment reaction with anxiety 12/20/2015  . Aortic stenosis   . CAD (coronary artery disease) 06/09/2017   Nuc study 10/18: EF 69, inferolateral perfusion defect-possible small infarct with peri-infarct ischemia; intermediate risk // LHC 10/18: pLAD 25, pLCx 50, pRCA 25 >> med Rx  . Carotid stenosis, asymptomatic, bilateral 02/27/2016   Carotid US 5/17: Bilateral ICA 1-39  . Cerebrovascular accident (stroke) ([place])    a. 12/2015 - cryptogenic.  S/P MDT Linq.  . Difficulty walking   . Essential hypertension   . Gait disorder 04/07/2016  . Heart palpitations 09/06/2012  . History of CVA with residual deficit 12/14/2015  . History of dizziness   . History of echocardiogram    Echo 10/18: Vigorous LVF, EF 65-70, normal wall motion, grade 1 diastolic dysfunction, GLS -21.1%, mild RAE  . History of nuclear stress test    Nuclear stress test 10/18: EF 69, inf-lat defect c/w poss infarct with peri-infarct ischemia; Intermediate Risk  . Hyperlipidemia   . Hypothyroidism   . Left hemiparesis ([place])   . LVH (left ventricular hypertrophy)   . Mitral regurgitation    a. 12/2015 Echo: EF 60-65%, mild focal basal hypertrophy. No rwma, triv AI, mild MR, mildly dil LA w/o thrombus. No RA thrombus. No PFO.  Marland Kitchen Palpitations   . Prediabetes   . Stroke-like symptom 12/13/2015  . SVT (supraventricular tachycardia) ([place]) 12/14/2015  . Vascular parkinsonism ([place])  06/29/2016  . Vitamin D deficiency     Past Surgical History:  Procedure Laterality Date  . CARDIOVASCULAR STRESS TEST  2002   NORMAL  . EP IMPLANTABLE DEVICE N/A 12/17/2015   Procedure: Loop Recorder Insertion;  Surgeon: [name], MD;  Location: [facility];  Service: Cardiovascular;  Laterality: N/A;  . LEFT HEART CATH AND CORONARY ANGIOGRAPHY N/A 05/25/2017   Procedure: LEFT HEART CATH AND CORONARY ANGIOGRAPHY;  Surgeon: Sherren Mocha, MD;  Location: [facility];  Service: Cardiovascular;  Laterality: N/A;  . LOOP RECORDER IMPLANT    . TEE WITHOUT CARDIOVERSION N/A 12/17/2015   Procedure: TRANSESOPHAGEAL ECHOCARDIOGRAM (TEE);  Surgeon: Lelon Perla, MD;  Location: [facility] ENDOSCOPY;  Service: Cardiovascular;  Laterality: N/A;  Pt also needs a LOOP  . TRANSTHORACIC ECHOCARDIOGRAM  2008   SHOWED LEFT VENTRICULAR HYPERTROPHY AND MILD AORTIC STENOSIS    There were no vitals filed for this visit.  Subjective Assessment - 04/20/19 1356    Subjective  tired today    Currently in Pain?  Yes    Pain Score  2     Pain Location  Hip                       [facility] Adult PT Treatment/Exercise - 04/20/19 0001      Ambulation/Gait   Gait Comments  amb in all HHA  to progress cadeance and stride and then release handhold and cue thru steps      Lumbar Exercises: Aerobic   Tread Mill  1 mph 5 min CGA - working to increase strid ean dcadeance    Nustep  lvl 4 72mn      Lumbar Exercises: Seated   Sit to Stand  15 reps   cud to keep wt fwd, from mat   Other Seated Lumbar Exercises  pt on EOB, rolling wt ball and having pt lean over from sittiing to get ball and throw back      Knee/Hip Exercises: Machines for Strengthening   Cybex Knee Extension  10# BIL 1 set 15. 5# left 2 sets 10    Cybex Knee Flexion  15# left only 2 sets 10. BIL 25# 12 reps      Knee/Hip Exercises: Standing   Forward Step Up  Both;1 set;10 reps;Hand Hold: 2;Step Height: 6"   HHA    Other Standing Knee Exercises  alt step tap 20 times 2 sets   min A with left foot not clearing step               PT Short Term Goals - 08/25/18 1153      PT SHORT TERM GOAL #1   Title  independent with her home HEP    Status  Achieved        PT Long Term Goals - 04/20/19 1406      PT LONG TERM GOAL #1   Title  TUG with RW 23.8 sec    Baseline  31.4 sec, turning is still and issue driving the  time up      PT LONG TERM GOAL #2   Title  increase Berg balance score to 40/56 for safety    Status  Achieved      PT LONG TERM GOAL #3   Title  increase LE strength (particularly ABD and ext.) to 4/5 for functional gait and safety    Baseline  4/5 hips and knees tested in sitting except BIL hip flexors 4-/5    Status  Partially Met      PT LONG TERM GOAL #4   Title  be able to do a sit to stand without assistance and without losing balance for functional use    Baseline  pt still slow with STS and decreased initial balance    Status  On-going            Plan - 04/20/19 1442    Clinical Impression Statement  pt is slowly making progress. BERG goal has been met but pt remains a fall risk at a 40/56. Pt progress with TUG score is limited d/t increased time and difficulty with turns. LE strength is improving but pt still has hip weakness esp hip flexors and trouble picking up left foot. pt was trying to transition to independant gym program but missed 2 week sthis cert period d/t MD appt and ear infection- will recert another mointh and then try to progress to independant ex program.    PT Treatment/Interventions  ADLs/Self Care Home Management;Electrical Stimulation;Moist Heat;Gait training;Stair training;Therapeutic activities;Functional mobility training;Therapeutic exercise;Patient/family education;Neuromuscular re-education;Balance training    PT Next Visit Plan  continue to work on balance, stride length, and righting responses       Patient will benefit from skilled  therapeutic intervention in order to improve the following deficits and impairments:  Abnormal gait, Pain, Postural dysfunction, Increased muscle spasms, Cardiopulmonary status limiting activity,  Decreased activity tolerance, Decreased strength, Difficulty walking, Decreased balance  Visit Diagnosis: Difficulty in walking, not elsewhere classified  Muscle weakness (generalized)  Balance problem  Repeated falls     Problem List Patient Active Problem List   Diagnosis Date Noted  . Familial hypercholesteremia 02/21/2018  . Psychogenic gait 10/21/2017  . Coronary artery disease involving native coronary artery of native heart without angina pectoris 06/09/2017  . Abnormal nuclear cardiac imaging test 05/25/2017  . PVC's (premature ventricular contractions) 05/17/2017  . Anxiety 12/29/2016  . Vascular parkinsonism ([place]) 06/29/2016  . Gait disorder 04/07/2016  . Intracranial carotid stenosis, bilateral 02/27/2016  . Essential hypertension   . Left-sided weakness 12/28/2015  . Headache 12/28/2015  . Adjustment reaction with anxiety 12/20/2015  . Left hemiparesis ([place] [facility])   . History of CVA with residual deficit   . Prediabetes   . SVT (supraventricular tachycardia) ([place]) 12/14/2015  . Stroke-like symptom 12/13/2015  . History of stroke   . Heart palpitations 09/06/2012  . Hypertension   . Familial hyperlipidemia   . Hypothyroidism   . Vitamin D deficiency   . Palpitations   . History of dizziness   . LVH (left ventricular hypertrophy)   . Aortic stenosis   . Mitral regurgitation   . SOB (shortness of breath)   . Difficulty walking     ,ANGIE PTA 04/20/2019, 2:49 PM  [place], [place], [address] Phone: [phone]   Fax:  [phone]  Name: Stacey Ramirez MRN: [id number] Date of Birth: March 07, 1941

## 2019-04-25 ENCOUNTER — Ambulatory Visit: Payer: PPO | Admitting: Physical Therapy

## 2019-04-25 ENCOUNTER — Other Ambulatory Visit: Payer: Self-pay

## 2019-04-25 ENCOUNTER — Ambulatory Visit (INDEPENDENT_AMBULATORY_CARE_PROVIDER_SITE_OTHER): Payer: PPO | Admitting: *Deleted

## 2019-04-25 DIAGNOSIS — R296 Repeated falls: Secondary | ICD-10-CM

## 2019-04-25 DIAGNOSIS — M6281 Muscle weakness (generalized): Secondary | ICD-10-CM | POA: Diagnosis not present

## 2019-04-25 DIAGNOSIS — I10 Essential (primary) hypertension: Secondary | ICD-10-CM | POA: Diagnosis not present

## 2019-04-25 DIAGNOSIS — R2689 Other abnormalities of gait and mobility: Secondary | ICD-10-CM

## 2019-04-25 DIAGNOSIS — R262 Difficulty in walking, not elsewhere classified: Secondary | ICD-10-CM

## 2019-04-25 DIAGNOSIS — I471 Supraventricular tachycardia: Secondary | ICD-10-CM

## 2019-04-25 LAB — CUP PACEART REMOTE DEVICE CHECK
Date Time Interrogation Session: 20200922200835
Implantable Pulse Generator Implant Date: 20170516

## 2019-04-25 NOTE — Therapy (Signed)
[place], [place], [address] Phone: [phone]   Fax:  [phone]  Physical Therapy Treatment  Patient Details  Name: Stacey Ramirez MRN: [id number] Date of Birth: 12-Sep-1940 Referring Provider (PT): Pharr   Encounter Date: 04/25/2019  PT End of Session - 04/25/19 1255    Visit Number  28    Date for PT Re-Evaluation  05/20/19    PT Start Time  1225    PT Stop Time  1312    PT Time Calculation (min)  47 min       Past Medical History:  Diagnosis Date  . Adjustment reaction with anxiety 12/20/2015  . Aortic stenosis   . CAD (coronary artery disease) 06/09/2017   Nuc study 10/18: EF 69, inferolateral perfusion defect-possible small infarct with peri-infarct ischemia; intermediate risk // LHC 10/18: pLAD 25, pLCx 50, pRCA 25 >> med Rx  . Carotid stenosis, asymptomatic, bilateral 02/27/2016   Carotid US 5/17: Bilateral ICA 1-39  . Cerebrovascular accident (stroke) ([facility])    a. 12/2015 - cryptogenic.  S/P MDT Linq.  . Difficulty walking   . Essential hypertension   . Gait disorder 04/07/2016  . Heart palpitations 09/06/2012  . History of CVA with residual deficit 12/14/2015  . History of dizziness   . History of echocardiogram    Echo 10/18: Vigorous LVF, EF 65-70, normal wall motion, grade 1 diastolic dysfunction, GLS -21.1%, mild RAE  . History of nuclear stress test    Nuclear stress test 10/18: EF 69, inf-lat defect c/w poss infarct with peri-infarct ischemia; Intermediate Risk  . Hyperlipidemia   . Hypothyroidism   . Left hemiparesis ([place])   . LVH (left ventricular hypertrophy)   . Mitral regurgitation    a. 12/2015 Echo: EF 60-65%, mild focal basal hypertrophy. No rwma, triv AI, mild MR, mildly dil LA w/o thrombus. No RA thrombus. No PFO.  Marland Kitchen Palpitations   . Prediabetes   . Stroke-like symptom 12/13/2015  . SVT (supraventricular tachycardia) ([place] [place]) 12/14/2015  . Vascular parkinsonism ([place])  06/29/2016  . Vitamin D deficiency     Past Surgical History:  Procedure Laterality Date  . CARDIOVASCULAR STRESS TEST  2002   NORMAL  . EP IMPLANTABLE DEVICE N/A 12/17/2015   Procedure: Loop Recorder Insertion;  Surgeon: [name], MD;  Location: [facility];  Service: Cardiovascular;  Laterality: N/A;  . LEFT HEART CATH AND CORONARY ANGIOGRAPHY N/A 05/25/2017   Procedure: LEFT HEART CATH AND CORONARY ANGIOGRAPHY;  Surgeon: Sherren Mocha, MD;  Location: [facility];  Service: Cardiovascular;  Laterality: N/A;  . LOOP RECORDER IMPLANT    . TEE WITHOUT CARDIOVERSION N/A 12/17/2015   Procedure: TRANSESOPHAGEAL ECHOCARDIOGRAM (TEE);  Surgeon: Lelon Perla, MD;  Location: [facility] ENDOSCOPY;  Service: Cardiovascular;  Laterality: N/A;  Pt also needs a LOOP  . TRANSTHORACIC ECHOCARDIOGRAM  2008   SHOWED LEFT VENTRICULAR HYPERTROPHY AND MILD AORTIC STENOSIS    There were no vitals filed for this visit.  Subjective Assessment - 04/25/19 1242    Subjective  doing pretty good    Currently in Pain?  Yes    Pain Score  2     Pain Location  Hip    Pain Orientation  Left                       [facility] Adult PT Treatment/Exercise - 04/25/19 0001      Ambulation/Gait  Gait Comments  amb outside level and unlevel terrain with RW 1000 feet, 2 standing rest breaks. Mod I level, CGA but no LOB. on uneven neg 2 curbs up and down CGA  with cuing for correct sequencing. uneven terrain and with fatigue needed cuing to pick up left leg      High Level Balance   High Level Balance Activities  Side stepping;Braiding;Backward walking;Tandem walking;Marching forwards;Marching turns   all HHA with cuing     Lumbar Exercises: Aerobic   Nustep  Level 5 5 min      Knee/Hip Exercises: Machines for Strengthening   Cybex Knee Extension  10# BIL 1 set 15. 5# left 2 sets 10    Cybex Knee Flexion  15# left only 2 sets 10. BIL 25# 12 reps    Cybex Leg Press  30# 3 sets 10                PT Short Term Goals - 08/25/18 1153      PT SHORT TERM GOAL #1   Title  independent with her home HEP    Status  Achieved        PT Long Term Goals - 04/25/19 1254      PT LONG TERM GOAL #1   Title  TUG with RW 23.8 sec    Baseline  30.8 sec with RW    Status  Partially Met      PT LONG TERM GOAL #3   Title  increase LE strength (particularly ABD and ext.) to 4/5 for functional gait and safety    Baseline  4/5 hips and knees tested in sitting except BIL hip flexors 4-/5    Status  Partially Met      PT LONG TERM GOAL #4   Title  be able to do a sit to stand without assistance and without losing balance for functional use    Status  Partially Met            Plan - 04/25/19 1256    Clinical Impression Statement  pt did very well with gait training today, pt was CGA on enevn terrain and with curb neg but did not have a LOB. she did need cuing for correct sequencing and cued with fatigue and uneven terrian to pick up left foot as it tended to drag. continuing to work on strength and balance.       Patient will benefit from skilled therapeutic intervention in order to improve the following deficits and impairments:     Visit Diagnosis: Muscle weakness (generalized)  Difficulty in walking, not elsewhere classified  Balance problem  Repeated falls     Problem List Patient Active Problem List   Diagnosis Date Noted  . Familial hypercholesteremia 02/21/2018  . Psychogenic gait 10/21/2017  . Coronary artery disease involving native coronary artery of native heart without angina pectoris 06/09/2017  . Abnormal nuclear cardiac imaging test 05/25/2017  . PVC's (premature ventricular contractions) 05/17/2017  . Anxiety 12/29/2016  . Vascular parkinsonism ([place]) 06/29/2016  . Gait disorder 04/07/2016  . Intracranial carotid stenosis, bilateral 02/27/2016  . Essential hypertension   . Left-sided weakness 12/28/2015  . Headache 12/28/2015  .  Adjustment reaction with anxiety 12/20/2015  . Left hemiparesis ([place])   . History of CVA with residual deficit   . Prediabetes   . SVT (supraventricular tachycardia) ([place]) 12/14/2015  . Stroke-like symptom 12/13/2015  . History of stroke   . Heart palpitations 09/06/2012  . Hypertension   .  Familial hyperlipidemia   . Hypothyroidism   . Vitamin D deficiency   . Palpitations   . History of dizziness   . LVH (left ventricular hypertrophy)   . Aortic stenosis   . Mitral regurgitation   . SOB (shortness of breath)   . Difficulty walking     Deloise Marchant,ANGIE PTA 04/25/2019, 1:00 PM  [place] [address] [address] [place], [place], [address] Phone: [phone]   Fax:  [phone]  Name: Stacey Ramirez MRN: [id number] Date of Birth: Jun 11, 1941

## 2019-04-27 ENCOUNTER — Other Ambulatory Visit: Payer: Self-pay

## 2019-04-27 ENCOUNTER — Ambulatory Visit: Payer: PPO | Admitting: Physical Therapy

## 2019-04-27 DIAGNOSIS — M6281 Muscle weakness (generalized): Secondary | ICD-10-CM | POA: Diagnosis not present

## 2019-04-27 DIAGNOSIS — R262 Difficulty in walking, not elsewhere classified: Secondary | ICD-10-CM

## 2019-04-27 DIAGNOSIS — R2689 Other abnormalities of gait and mobility: Secondary | ICD-10-CM

## 2019-04-27 NOTE — Therapy (Signed)
[facility] [address] [place], [place], [address] Phone: [phone]   Fax:  [phone]  Physical Therapy Treatment  Patient Details  Name: Stacey Ramirez MRN: [id number] Date of Birth: 04/24/1941 Referring Provider (PT): Pharr   Encounter Date: 04/27/2019  PT End of Session - 04/27/19 1052    Visit Number  29    Date for PT Re-Evaluation  05/20/19    PT Start Time  1015    PT Stop Time  1100    PT Time Calculation (min)  45 min       Past Medical History:  Diagnosis Date  . Adjustment reaction with anxiety 12/20/2015  . Aortic stenosis   . CAD (coronary artery disease) 06/09/2017   Nuc study 10/18: EF 69, inferolateral perfusion defect-possible small infarct with peri-infarct ischemia; intermediate risk // LHC 10/18: pLAD 25, pLCx 50, pRCA 25 >> med Rx  . Carotid stenosis, asymptomatic, bilateral 02/27/2016   Carotid US 5/17: Bilateral ICA 1-39  . Cerebrovascular accident (stroke) ([facility])    a. 12/2015 - cryptogenic.  S/P MDT Linq.  . Difficulty walking   . Essential hypertension   . Gait disorder 04/07/2016  . Heart palpitations 09/06/2012  . History of CVA with residual deficit 12/14/2015  . History of dizziness   . History of echocardiogram    Echo 10/18: Vigorous LVF, EF 65-70, normal wall motion, grade 1 diastolic dysfunction, GLS -21.1%, mild RAE  . History of nuclear stress test    Nuclear stress test 10/18: EF 69, inf-lat defect c/w poss infarct with peri-infarct ischemia; Intermediate Risk  . Hyperlipidemia   . Hypothyroidism   . Left hemiparesis ([facility])   . LVH (left ventricular hypertrophy)   . Mitral regurgitation    a. 12/2015 Echo: EF 60-65%, mild focal basal hypertrophy. No rwma, triv AI, mild MR, mildly dil LA w/o thrombus. No RA thrombus. No PFO.  . Palpitations   . Prediabetes   . Stroke-like symptom 12/13/2015  . SVT (supraventricular tachycardia) ([facility]) 12/14/2015  . Vascular parkinsonism ([facility])  06/29/2016  . Vitamin D deficiency     Past Surgical History:  Procedure Laterality Date  . CARDIOVASCULAR STRESS TEST  2002   NORMAL  . EP IMPLANTABLE DEVICE N/A 12/17/2015   Procedure: Loop Recorder Insertion;  Surgeon: James Allred, MD;  [facility];  Service: Cardiovascular;  Laterality: N/A;  . LEFT HEART CATH AND CORONARY ANGIOGRAPHY N/A 05/25/2017   Procedure: LEFT HEART CATH AND CORONARY ANGIOGRAPHY;  Surgeon: Cooper, Michael, MD;  Location: [facility];  Service: Cardiovascular;  Laterality: N/A;  . LOOP RECORDER IMPLANT    . TEE WITHOUT CARDIOVERSION N/A 12/17/2015   Procedure: TRANSESOPHAGEAL ECHOCARDIOGRAM (TEE);  Surgeon: Brian S Crenshaw, MD;  Location: [facility] ENDOSCOPY;  Service: Cardiovascular;  Laterality: N/A;  Pt also needs a LOOP  . TRANSTHORACIC ECHOCARDIOGRAM  2008   SHOWED LEFT VENTRICULAR HYPERTROPHY AND MILD AORTIC STENOSIS    There were no vitals filed for this visit.  Subjective Assessment - 04/27/19 1019    Subjective  I am getting stronger- I can tell at home when I do things    Currently in Pain?  Yes    Pain Score  2     Pain Location  Hip    Pain Orientation  Left                       [facility] Adult PT Treatment/Exercise -   04/27/19 0001      Ambulation/Gait   Gait Comments  working on gait without AD to work on balance and righting reaction- CG to min A with cuing/encouragement      Lumbar Exercises: Aerobic   UBE (Upper Arm Bike)  L 3 22fd/3 back    Nustep  L5 7 min      Knee/Hip Exercises: Standing   Other Standing Knee Exercises  3# hip flex,ext , abd and marching 15 times with RW on airex   CGA with cuing     Knee/Hip Exercises: Seated   Long Arc Quad  Strengthening;Both;2 sets;10 reps;Weights   3 sec TKE hold   Long Arc Quad Weight  3 lbs.    Sit to Sand  without UE support   working on initial standing balance upon rising from  [place]- 08/25/18 1153      PT  SHORT TERM GOAL #1   Title  independent with her home HEP    Status  Achieved        PT Long Term Goals - 04/25/19 1254      PT LONG TERM GOAL #1   Title  TUG with RW 23.8 sec    Baseline  30.8 sec with RW    Status  Partially Met      PT LONG TERM GOAL #3   Title  increase LE strength (particularly ABD and ext.) to 4/5 for functional gait and safety    Baseline  4/5 hips and knees tested in sitting except BIL hip flexors 4-/5    Status  Partially Met      PT LONG TERM GOAL #4   Title  be able to do a sit to stand without assistance and without losing balance for functional use    Status  Partially Met            Plan - 04/27/19 1052    Clinical Impression Statement  pt toelrated strengtheing ex well.improved STS and initial standing balance from UBE at various seat heights. worked on gait without AD and pt very fearful , neede CGA and max cuing    PT Treatment/Interventions  ADLs/Self Care Home Management;Electrical Stimulation;Moist Heat;Gait training;Stair training;Therapeutic activities;Functional mobility training;Therapeutic exercise;Patient/family education;Neuromuscular re-education;Balance training    PT Next Visit Plan  continue to work on balance, stride length, and righting responses       Patient will benefit from skilled therapeutic intervention in order to improve the following deficits and impairments:  Abnormal gait, Pain, Postural dysfunction, Increased muscle spasms, Cardiopulmonary status limiting activity, Decreased activity tolerance, Decreased strength, Difficulty walking, Decreased balance  Visit Diagnosis: Muscle weakness (generalized)  Difficulty in walking, not elsewhere classified  Balance problem     Problem List Patient Active Problem List   Diagnosis Date Noted  . Familial hypercholesteremia 02/21/2018  . Psychogenic gait 10/21/2017  . Coronary artery disease involving native coronary artery of native heart without angina pectoris  06/09/2017  . Abnormal nuclear cardiac imaging test 05/25/2017  . PVC's (premature ventricular contractions) 05/17/2017  . Anxiety 12/29/2016  . [place] ([place] 06/29/2016  . Gait disorder 04/07/2016  . Intracranial carotid stenosis, bilateral 02/27/2016  . Essential hypertension   . Left-sided weakness 12/28/2015  . Headache 12/28/2015  . Adjustment reaction with anxiety 12/20/2015  . Left hemiparesis ([place]   . History of CVA with residual deficit   . Prediabetes   .  SVT (supraventricular tachycardia) ([facility]) 12/14/2015  . Stroke-like symptom 12/13/2015  . History of stroke   . Heart palpitations 09/06/2012  . Hypertension   . Familial hyperlipidemia   . Hypothyroidism   . Vitamin D deficiency   . Palpitations   . History of dizziness   . LVH (left ventricular hypertrophy)   . Aortic stenosis   . Mitral regurgitation   . SOB (shortness of breath)   . Difficulty walking     ,ANGIE PTA 04/27/2019, 10:53 AM  [facility] [address] [place], [place], [address] Phone: [phone]   Fax:  [phone]  Name: Stacey Ramirez MRN: [id number] Date of Birth: 12/21/1940   

## 2019-05-01 DIAGNOSIS — E040 Nontoxic diffuse goiter: Secondary | ICD-10-CM | POA: Diagnosis not present

## 2019-05-01 DIAGNOSIS — Z23 Encounter for immunization: Secondary | ICD-10-CM | POA: Diagnosis not present

## 2019-05-02 ENCOUNTER — Encounter: Payer: Self-pay | Admitting: Physical Therapy

## 2019-05-02 ENCOUNTER — Ambulatory Visit: Payer: PPO | Admitting: Physical Therapy

## 2019-05-02 ENCOUNTER — Other Ambulatory Visit: Payer: Self-pay

## 2019-05-02 DIAGNOSIS — R2689 Other abnormalities of gait and mobility: Secondary | ICD-10-CM

## 2019-05-02 DIAGNOSIS — R296 Repeated falls: Secondary | ICD-10-CM

## 2019-05-02 DIAGNOSIS — R262 Difficulty in walking, not elsewhere classified: Secondary | ICD-10-CM

## 2019-05-02 DIAGNOSIS — M6281 Muscle weakness (generalized): Secondary | ICD-10-CM | POA: Diagnosis not present

## 2019-05-02 NOTE — Therapy (Signed)
Stacey Ramirez, [place], [address] Phone: [phone]   Fax:  [phone]  Physical Therapy Treatment  Patient Details  Name: Stacey Ramirez MRN: [id number] Date of Birth: 1940/10/24 Referring Provider (PT): Pharr   Encounter Date: 05/02/2019  PT End of Session - 05/02/19 7824    Visit Number  30    Date for PT Re-Evaluation  05/20/19    Authorization - Visit Number  1600    Authorization - Number of Visits  [id number]    Activity Tolerance  Patient tolerated treatment well    Behavior During Therapy  [facility] for tasks assessed/performed       Past Medical History:  Diagnosis Date  . Adjustment reaction with anxiety 12/20/2015  . Aortic stenosis   . CAD (coronary artery disease) 06/09/2017   Nuc study 10/18: EF 69, inferolateral perfusion defect-possible small infarct with peri-infarct ischemia; intermediate risk // LHC 10/18: pLAD 25, pLCx 50, pRCA 25 >> med Rx  . Carotid stenosis, asymptomatic, bilateral 02/27/2016   Carotid US 5/17: Bilateral ICA 1-39  . Cerebrovascular accident (stroke) ([place])    a. 12/2015 - cryptogenic.  S/P MDT Linq.  . Difficulty walking   . Essential hypertension   . Gait disorder 04/07/2016  . Heart palpitations 09/06/2012  . History of CVA with residual deficit 12/14/2015  . History of dizziness   . History of echocardiogram    Echo 10/18: Vigorous LVF, EF 65-70, normal wall motion, grade 1 diastolic dysfunction, GLS -21.1%, mild RAE  . History of nuclear stress test    Nuclear stress test 10/18: EF 69, inf-lat defect c/w poss infarct with peri-infarct ischemia; Intermediate Risk  . Hyperlipidemia   . Hypothyroidism   . Left hemiparesis ([place])   . LVH (left ventricular hypertrophy)   . Mitral regurgitation    a. 12/2015 Echo: EF 60-65%, mild focal basal hypertrophy. No rwma, triv AI, mild MR, mildly dil LA w/o thrombus. No RA thrombus. No PFO.  Marland Kitchen Palpitations   . Prediabetes   .  Stroke-like symptom 12/13/2015  . SVT (supraventricular tachycardia) ([place]) 12/14/2015  . Vascular parkinsonism ([place]) 06/29/2016  . Vitamin D deficiency     Past Surgical History:  Procedure Laterality Date  . CARDIOVASCULAR STRESS TEST  2002   NORMAL  . EP IMPLANTABLE DEVICE N/A 12/17/2015   Procedure: Loop Recorder Insertion;  Surgeon: [name], MD;  Location: [facility];  Service: Cardiovascular;  Laterality: N/A;  . LEFT HEART CATH AND CORONARY ANGIOGRAPHY N/A 05/25/2017   Procedure: LEFT HEART CATH AND CORONARY ANGIOGRAPHY;  Surgeon: Sherren Mocha, MD;  Location: [facility];  Service: Cardiovascular;  Laterality: N/A;  . LOOP RECORDER IMPLANT    . TEE WITHOUT CARDIOVERSION N/A 12/17/2015   Procedure: TRANSESOPHAGEAL ECHOCARDIOGRAM (TEE);  Surgeon: Lelon Perla, MD;  Location: [facility];  Service: Cardiovascular;  Laterality: N/A;  Pt also needs a LOOP  . TRANSTHORACIC ECHOCARDIOGRAM  2008   SHOWED LEFT VENTRICULAR HYPERTROPHY AND MILD AORTIC STENOSIS    There were no vitals filed for this visit.  Subjective Assessment - 05/02/19 1303    Subjective  "I don't think this leg is getting better"    Currently in Pain?  Yes    Pain Score  1     Pain Location  Hip    Pain Orientation  Left  [place] Adult PT Treatment/Exercise - 05/02/19 0001      Ambulation/Gait   Stairs  Yes    Stairs Assistance  5: Supervision;4: Min guard    Stair Management Technique  One rail Right;Alternating pattern;Step to pattern    Number of Stairs  24    Height of Stairs  6    Gait Comments  working on gait without AD to work on balance and righting reaction- CG to min A with cuing/encouragement      Lumbar Exercises: Aerobic   Elliptical  I5 R 3 x2.5 minutes     Nustep  L5 6 min      Lumbar Exercises: Standing   Other Standing Lumbar Exercises  Foeward presses with yellow ball 2x10      Lumbar Exercises: Seated   Long Arc Quad on  Chair  Left;2 sets;10 reps    LAQ on Chair Weights (lbs)  3      Knee/Hip Exercises: Machines for Strengthening   Cybex Leg Press  30# 2 sets 10, LLE only no weight x15      Knee/Hip Exercises: Standing   Other Standing Knee Exercises  Alt box taps 4in MMA x1 2x10     Other Standing Knee Exercises  3lb hip flex 2x10 Knee fles 3lb 2x10  in RW                PT Short Term Goals - 08/25/18 1153      PT SHORT TERM GOAL #1   Title  independent with her home HEP    Status  Achieved        PT Long Term Goals - 04/25/19 1254      PT LONG TERM GOAL #1   Title  TUG with RW 23.8 sec    Baseline  30.8 sec with RW    Status  Partially Met      PT LONG TERM GOAL #3   Title  increase LE strength (particularly ABD and ext.) to 4/5 for functional gait and safety    Baseline  4/5 hips and knees tested in sitting except BIL hip flexors 4-/5    Status  Partially Met      PT LONG TERM GOAL #4   Title  be able to do a sit to stand without assistance and without losing balance for functional use    Status  Partially Met            Plan - 05/02/19 1347    Clinical Impression Statement  No issues completing today's interventions. Pt does fatigue with elliptical warm up and stair negotiation. Pt ambulated today between interventions without AD but very fearful. Initial hesitation to take step after standing.    Stability/Clinical Decision Making  Stable/Uncomplicated    Rehab Potential  Good    PT Frequency  2x / week    PT Duration  8 weeks    PT Treatment/Interventions  ADLs/Self Care Home Management;Electrical Stimulation;Moist Heat;Gait training;Stair training;Therapeutic activities;Functional mobility training;Therapeutic exercise;Patient/family education;Neuromuscular re-education;Balance training    PT Next Visit Plan  continue to work on balance, stride length, and righting responses       Patient will benefit from skilled therapeutic intervention in order to improve the  following deficits and impairments:  Abnormal gait, Pain, Postural dysfunction, Increased muscle spasms, Cardiopulmonary status limiting activity, Decreased activity tolerance, Decreased strength, Difficulty walking, Decreased balance  Visit Diagnosis: Muscle weakness (generalized)  Difficulty in walking, not elsewhere classified  Balance problem  Repeated falls  Problem List Patient Active Problem List   Diagnosis Date Noted  . Familial hypercholesteremia 02/21/2018  . Psychogenic gait 10/21/2017  . Coronary artery disease involving native coronary artery of native heart without angina pectoris 06/09/2017  . Abnormal nuclear cardiac imaging test 05/25/2017  . PVC's (premature ventricular contractions) 05/17/2017  . Anxiety 12/29/2016  . Vascular parkinsonism ([place]) 06/29/2016  . Gait disorder 04/07/2016  . Intracranial carotid stenosis, bilateral 02/27/2016  . Essential hypertension   . Left-sided weakness 12/28/2015  . Headache 12/28/2015  . Adjustment reaction with anxiety 12/20/2015  . Left hemiparesis ([place])   . History of CVA with residual deficit   . Prediabetes   . SVT (supraventricular tachycardia) ([place]) 12/14/2015  . Stroke-like symptom 12/13/2015  . History of stroke   . Heart palpitations 09/06/2012  . Hypertension   . Familial hyperlipidemia   . Hypothyroidism   . Vitamin D deficiency   . Palpitations   . History of dizziness   . LVH (left ventricular hypertrophy)   . Aortic stenosis   . Mitral regurgitation   . SOB (shortness of breath)   . Difficulty walking     Scot Jun, PTA 05/02/2019, 1:54 PM  [place], [place], [address] Phone: [phone]   Fax:  [phone]  Name: Stacey Ramirez MRN: [id number] Date of Birth: [birth year]

## 2019-05-03 NOTE — Progress Notes (Signed)
Carelink Summary Report / Loop Recorder 

## 2019-05-04 ENCOUNTER — Ambulatory Visit: Payer: PPO | Admitting: Physical Therapy

## 2019-05-08 DIAGNOSIS — Z8673 Personal history of transient ischemic attack (TIA), and cerebral infarction without residual deficits: Secondary | ICD-10-CM | POA: Diagnosis not present

## 2019-05-08 DIAGNOSIS — G214 Vascular parkinsonism: Secondary | ICD-10-CM | POA: Diagnosis not present

## 2019-05-08 DIAGNOSIS — R531 Weakness: Secondary | ICD-10-CM | POA: Diagnosis not present

## 2019-05-08 DIAGNOSIS — R269 Unspecified abnormalities of gait and mobility: Secondary | ICD-10-CM | POA: Diagnosis not present

## 2019-05-08 DIAGNOSIS — R2681 Unsteadiness on feet: Secondary | ICD-10-CM | POA: Diagnosis not present

## 2019-05-09 ENCOUNTER — Ambulatory Visit: Payer: PPO | Attending: Specialist | Admitting: Physical Therapy

## 2019-05-09 ENCOUNTER — Other Ambulatory Visit: Payer: Self-pay

## 2019-05-09 DIAGNOSIS — R2689 Other abnormalities of gait and mobility: Secondary | ICD-10-CM | POA: Diagnosis not present

## 2019-05-09 DIAGNOSIS — R296 Repeated falls: Secondary | ICD-10-CM

## 2019-05-09 DIAGNOSIS — M6281 Muscle weakness (generalized): Secondary | ICD-10-CM | POA: Insufficient documentation

## 2019-05-09 DIAGNOSIS — R262 Difficulty in walking, not elsewhere classified: Secondary | ICD-10-CM | POA: Insufficient documentation

## 2019-05-09 NOTE — Therapy (Signed)
[place], [place], [address] Phone: [phone]   Fax:  [phone]  Physical Therapy Treatment  Patient Details  Name: Stacey Ramirez MRN: [id number] Date of Birth: [birth year] Referring Provider (PT): Shelia Media   Encounter Date: 05/09/2019  PT End of Session - 05/09/19 1442    Visit Number  31    Date for PT Re-Evaluation  05/20/19    PT Start Time  1359    PT Stop Time  1442    PT Time Calculation (min)  43 min       Past Medical History:  Diagnosis Date  . Adjustment reaction with anxiety 12/20/2015  . Aortic stenosis   . CAD (coronary artery disease) 06/09/2017   Nuc study 10/18: EF 69, inferolateral perfusion defect-possible small infarct with peri-infarct ischemia; intermediate risk // LHC 10/18: pLAD 25, pLCx 50, pRCA 25 >> med Rx  . Carotid stenosis, asymptomatic, bilateral 02/27/2016   Carotid US 5/17: Bilateral ICA 1-39  . Cerebrovascular accident (stroke) ([place])    a. 12/2015 - cryptogenic.  S/P MDT Linq.  . Difficulty walking   . Essential hypertension   . Gait disorder 04/07/2016  . Heart palpitations 09/06/2012  . History of CVA with residual deficit 12/14/2015  . History of dizziness   . History of echocardiogram    Echo 10/18: Vigorous LVF, EF 65-70, normal wall motion, grade 1 diastolic dysfunction, GLS -21.1%, mild RAE  . History of nuclear stress test    Nuclear stress test 10/18: EF 69, inf-lat defect c/w poss infarct with peri-infarct ischemia; Intermediate Risk  . Hyperlipidemia   . Hypothyroidism   . Left hemiparesis ([place])   . LVH (left ventricular hypertrophy)   . Mitral regurgitation    a. 12/2015 Echo: EF 60-65%, mild focal basal hypertrophy. No rwma, triv AI, mild MR, mildly dil LA w/o thrombus. No RA thrombus. No PFO.  Marland Kitchen Palpitations   . Prediabetes   . Stroke-like symptom 12/13/2015  . SVT (supraventricular tachycardia) ([place]) 12/14/2015  . Vascular parkinsonism ([place])  06/29/2016  . Vitamin D deficiency     Past Surgical History:  Procedure Laterality Date  . CARDIOVASCULAR STRESS TEST  2002   NORMAL  . EP IMPLANTABLE DEVICE N/A 12/17/2015   Procedure: Loop Recorder Insertion;  Surgeon: [name], MD;  Location: [facility];  Service: Cardiovascular;  Laterality: N/A;  . LEFT HEART CATH AND CORONARY ANGIOGRAPHY N/A 05/25/2017   Procedure: LEFT HEART CATH AND CORONARY ANGIOGRAPHY;  Surgeon: Sherren Mocha, MD;  Location: [facility];  Service: Cardiovascular;  Laterality: N/A;  . LOOP RECORDER IMPLANT    . TEE WITHOUT CARDIOVERSION N/A 12/17/2015   Procedure: TRANSESOPHAGEAL ECHOCARDIOGRAM (TEE);  Surgeon: Lelon Perla, MD;  Location: [facility];  Service: Cardiovascular;  Laterality: N/A;  Pt also needs a LOOP  . TRANSTHORACIC ECHOCARDIOGRAM  2008   SHOWED LEFT VENTRICULAR HYPERTROPHY AND MILD AORTIC STENOSIS    There were no vitals filed for this visit.  Subjective Assessment - 05/09/19 1418    Subjective  kinda been rough past week, moving slow    Currently in Pain?  Yes    Pain Score  2     Pain Location  Hip    Pain Orientation  Left                       [facility] Adult PT Treatment/Exercise - 05/09/19 0001  Ambulation/Gait   Gait Comments  HHA working on increased cadeance and stride      High Level Balance   High Level Balance Activities  Side stepping;Marching forwards;Backward walking   HHA 3# LE ankle wts     Lumbar Exercises: Aerobic   Nustep  L5 6 min      Lumbar Exercises: Seated   Sit to Stand  10 reps   CGA, 1 LOB posterior from mat     Knee/Hip Exercises: Machines for Strengthening   Cybex Knee Extension  10# BIL 1 set 15. 5# left 2 sets 10   fatigued more than normal and needed cued for full ext   Cybex Knee Flexion  15# left only 2 sets 10. BIL 25# 15 reps    Cybex Leg Press  30# 2 sets 10, LLE only no weight x15               PT Short Term Goals - 08/25/18 1153       PT SHORT TERM GOAL #1   Title  independent with her home HEP    Status  Achieved        PT Long Term Goals - 04/25/19 1254      PT LONG TERM GOAL #1   Title  TUG with RW 23.8 sec    Baseline  30.8 sec with RW    Status  Partially Met      PT LONG TERM GOAL #3   Title  increase LE strength (particularly ABD and ext.) to 4/5 for functional gait and safety    Baseline  4/5 hips and knees tested in sitting except BIL hip flexors 4-/5    Status  Partially Met      PT LONG TERM GOAL #4   Title  be able to do a sit to stand without assistance and without losing balance for functional use    Status  Partially Met            Plan - 05/09/19 1442    Clinical Impression Statement  pt continues to need physcial assistance with decreased balance and decreased confidence , pt has poor righting reaction. pt more fatigued today with most activities    PT Treatment/Interventions  ADLs/Self Care Home Management;Electrical Stimulation;Moist Heat;Gait training;Stair training;Therapeutic activities;Functional mobility training;Therapeutic exercise;Patient/family education;Neuromuscular re-education;Balance training    PT Next Visit Plan  continue to work on balance, stride length, and righting responses       Patient will benefit from skilled therapeutic intervention in order to improve the following deficits and impairments:  Abnormal gait, Pain, Postural dysfunction, Increased muscle spasms, Cardiopulmonary status limiting activity, Decreased activity tolerance, Decreased strength, Difficulty walking, Decreased balance  Visit Diagnosis: Muscle weakness (generalized)  Difficulty in walking, not elsewhere classified  Balance problem  Repeated falls     Problem List Patient Active Problem List   Diagnosis Date Noted  . Familial hypercholesteremia 02/21/2018  . Psychogenic gait 10/21/2017  . Coronary artery disease involving native coronary artery of native heart without angina  pectoris 06/09/2017  . Abnormal nuclear cardiac imaging test 05/25/2017  . PVC's (premature ventricular contractions) 05/17/2017  . Anxiety 12/29/2016  . Vascular parkinsonism ([place]) 06/29/2016  . Gait disorder 04/07/2016  . Intracranial carotid stenosis, bilateral 02/27/2016  . Essential hypertension   . Left-sided weakness 12/28/2015  . Headache 12/28/2015  . Adjustment reaction with anxiety 12/20/2015  . Left hemiparesis ([employer])   . History of CVA with residual deficit   . Prediabetes   .  SVT (supraventricular tachycardia) ([place]) 12/14/2015  . Stroke-like symptom 12/13/2015  . History of stroke   . Heart palpitations 09/06/2012  . Hypertension   . Familial hyperlipidemia   . Hypothyroidism   . Vitamin D deficiency   . Palpitations   . History of dizziness   . LVH (left ventricular hypertrophy)   . Aortic stenosis   . Mitral regurgitation   . SOB (shortness of breath)   . Difficulty walking     Treysean Petruzzi,ANGIE PTA 05/09/2019, 2:44 PM  [place], [place], [address] Phone: [phone]   Fax:  [phone]  Name: Stacey Ramirez MRN: [id number] Date of Birth: [birth year]

## 2019-05-11 ENCOUNTER — Other Ambulatory Visit: Payer: Self-pay

## 2019-05-11 ENCOUNTER — Ambulatory Visit: Payer: PPO | Admitting: Cardiovascular Disease

## 2019-05-11 ENCOUNTER — Ambulatory Visit: Payer: PPO | Admitting: Physical Therapy

## 2019-05-11 ENCOUNTER — Encounter: Payer: Self-pay | Admitting: Cardiovascular Disease

## 2019-05-11 VITALS — BP 120/72 | HR 89 | Ht 62.0 in | Wt 122.8 lb

## 2019-05-11 DIAGNOSIS — I10 Essential (primary) hypertension: Secondary | ICD-10-CM

## 2019-05-11 MED ORDER — DILTIAZEM HCL ER COATED BEADS 180 MG PO CP24: 180.0000 mg | ORAL_CAPSULE | Freq: Every day | ORAL | 11 refills | Status: DC

## 2019-05-11 NOTE — Progress Notes (Signed)
Office Visit    Patient Name: Stacey Ramirez Date of Encounter: 05/11/2019  Primary Care Provider:  Deland Pretty, MD Primary Cardiologist:  Vaughan Browner, MD  P. Zitlaly Malson, MD    78 year old female with a history of palpitations, hypertension, hyperlipidemia, and recent stroke who presents for follow-up.    Past Medical History    Past Medical History:  Diagnosis Date   Adjustment reaction with anxiety 12/20/2015   Aortic stenosis    CAD (coronary artery disease) 06/09/2017   Nuc study 10/18: EF 69, inferolateral perfusion defect-possible small infarct with peri-infarct ischemia; intermediate risk // LHC 10/18: pLAD 25, pLCx 21, pRCA 25 >> med Rx   Carotid stenosis, asymptomatic, bilateral 02/27/2016   Carotid US 5/17: Bilateral ICA 1-39   Cerebrovascular accident (stroke) ([place])    a. 12/2015 - cryptogenic.  S/P MDT Linq.   Difficulty walking    Essential hypertension    Gait disorder 04/07/2016   Heart palpitations 09/06/2012   History of CVA with residual deficit 12/14/2015   History of dizziness    History of echocardiogram    Echo 10/18: Vigorous LVF, EF 65-70, normal wall motion, grade 1 diastolic dysfunction, GLS -21.1%, mild RAE   History of nuclear stress test    Nuclear stress test 10/18: EF 69, inf-lat defect c/w poss infarct with peri-infarct ischemia; Intermediate Risk   Hyperlipidemia    Hypothyroidism    Left hemiparesis ([facility])    LVH (left ventricular hypertrophy)    Mitral regurgitation    a. 12/2015 Echo: EF 60-65%, mild focal basal hypertrophy. No rwma, triv AI, mild MR, mildly dil LA w/o thrombus. No RA thrombus. No PFO.   Palpitations    Prediabetes    Stroke-like symptom 12/13/2015   SVT (supraventricular tachycardia) ([place]) 12/14/2015   Vascular parkinsonism ([place]) 06/29/2016   Vitamin D deficiency    Past Surgical History:  Procedure Laterality Date   CARDIOVASCULAR STRESS TEST  2002   NORMAL   EP IMPLANTABLE DEVICE N/A 12/17/2015     Procedure: Loop Recorder Insertion;  Surgeon: [name], MD;  Location: [facility];  Service: Cardiovascular;  Laterality: N/A;   LEFT HEART CATH AND CORONARY ANGIOGRAPHY N/A 05/25/2017   Procedure: LEFT HEART CATH AND CORONARY ANGIOGRAPHY;  Surgeon: Sherren Mocha, MD;  Location: [facility];  Service: Cardiovascular;  Laterality: N/A;   LOOP RECORDER IMPLANT     TEE WITHOUT CARDIOVERSION N/A 12/17/2015   Procedure: TRANSESOPHAGEAL ECHOCARDIOGRAM (TEE);  Surgeon: Lelon Perla, MD;  Location: [facility] ENDOSCOPY;  Service: Cardiovascular;  Laterality: N/A;  Pt also needs a LOOP   TRANSTHORACIC ECHOCARDIOGRAM  2008   SHOWED LEFT VENTRICULAR HYPERTROPHY AND MILD AORTIC STENOSIS    Allergies  Allergies  Allergen Reactions   Sulfa Drugs Cross Reactors Itching   Zocor [Simvastatin] Other (See Comments)    Feels like she is going to pass out, weakness   Crestor [Rosuvastatin Calcium]     Muscle weakness   Statins Other (See Comments)    Unable to walk or stand. Unable to walk or stand. Unable to walk or stand.   Sulfa Antibiotics     welts   Micardis [Telmisartan] Other (See Comments)    Feels like she is going to pass out    Notes from Ignacia Bayley, NP    78 year old female previously followed by Dr. Mare Ferrari for hypertension and palpitations. She recently was admitted for cryptogenic stroke (right basal ganglia infarct), and underwent TEE revealing normal LV  function without evidence of atrial thrombus. She subsequently underwent placement of an implantable loop recorder. She has had no events on this recorder up to this point. She has been recovering well from her stroke and can now walk, though with a limp. She is encouraged overall. Her blood pressure at home has been running in the 140s to 150s, and her husband has been checking it about 3 times per day. She has not been having any palpitations, chest pain, dyspnea, PND, orthopnea, dizziness, syncope, edema,  or early satiety.  Oct. 20 ,2017: Suheyla is seen for the first time today.  Transfer from [place] . Has some leg / feet swelling  - likely due to the amlodipine .   Dec 01, 2016:  Is having some difficulty in walking since her stroke.   Working with PT.  Brought her BP log with her.   Has some elevated readings and then later will have low readings.  Suggested that she might be eating salty foods at times .   No CP or dyspnea    Has an implantable loop recorder.   Has not had any syncope Has not had it interrogated.      Has poor balance   Feb. 11, 2019  She has history of hypertension and hyperlipidemia. She was admitted with some shortness of breath  Cath 05/25/17  revealed mild - moderate CAD ,  No obstructive lesion s  Has done well since then .  Has some DOE with exercise  Is still very limited by her stroke   Aug. 26, 2019: Ms. Cresswell is seen today for follow up  She is had a stroke.  She has an implantable loop recorder in place. Was recovering from her stroke. Had sudden weakness of her left leg.    Has had some elevated BP .   September 13, 2018: This is Koper is seen today for follow-up visit.  She has a history of hypertension and has had a stroke.  She has an implantable loop recorder in place.  Has had several bad falls over the past several months  Has been taking amlodipine 10 mg at night.  No CP ,  Slight  dyspnea  Is using a walker now.   Is unsteady on her feet   May 11, 2019:   Mrs. Gervais is seen today for a follow-up of her stroke.  She has an implantable loop in place. She has a history of hypertension and hypothyroidism. Complains of leg swelling - likely  from the amlodipine   No cp, no dyspnea   Home Medications    Prior to Admission medications   Medication Sig Start Date End Date Taking? Authorizing Provider  ALPRAZolam (XANAX) 0.25 MG tablet Take 1 tablet (0.25 mg total) by mouth at bedtime. 12/25/15  Yes Ivan Anchors Love, PA-C    amLODipine (NORVASC) 10 MG tablet Take 1 tablet (10 mg total) by mouth 2 (two) times daily. Reported on 12/27/2015 01/23/16  Yes Rogelia Mire, NP  aspirin EC 325 MG EC tablet Take 1 tablet (325 mg total) by mouth daily. 12/17/15  Yes Lavina Hamman, MD  B Complex-C-Folic Acid (STRESS FORMULA) TABS Take 1 tablet by mouth daily.    Yes Historical Provider, MD  carboxymethylcellulose (REFRESH PLUS) 0.5 % SOLN Place 1 drop into both eyes 3 (three) times daily as needed.   Yes Historical Provider, MD  clopidogrel (PLAVIX) 75 MG tablet Take 1 tablet (75 mg total) by mouth daily. 12/24/15  Yes Ivan Anchors Love, PA-C  estradiol (ESTRACE) 0.1 MG/GM vaginal cream Place 2 g vaginally as needed (dryness).    Yes Historical Provider, MD  irbesartan (AVAPRO) 300 MG tablet Take 1 tablet (300 mg total) by mouth daily. 12/29/15  Yes Velvet Bathe, MD  levothyroxine (SYNTHROID, LEVOTHROID) 25 MCG tablet Take 25 mcg by mouth daily before breakfast.  09/21/14  Yes Historical Provider, MD  LOTEMAX 0.5 % ophthalmic suspension Place 1 drop into both eyes 4 (four) times daily as needed. For dry itchy eyes 02/17/13  Yes Historical Provider, MD  metoprolol tartrate (LOPRESSOR) 25 MG tablet Take 0.5 tablets (12.5 mg total) by mouth 2 (two) times daily. 12/24/15  Yes Ivan Anchors Love, PA-C  Multiple Vitamin (MULTIVITAMIN WITH MINERALS) TABS tablet Take 1 tablet by mouth daily.   Yes Historical Provider, MD  polyethylene glycol (MIRALAX / GLYCOLAX) packet Take 17 g by mouth daily. Patient taking differently: Take 17 g by mouth daily as needed for moderate constipation.  12/17/15  Yes Lavina Hamman, MD  sertraline (ZOLOFT) 50 MG tablet Take 50 mg by mouth daily.  01/02/16  Yes Historical Provider, MD    Review of Systems     Physical Exam: Blood pressure 120/72, pulse 89, height 5\' 2"  (1.575 m), weight 122 lb 12.8 oz (55.7 kg), SpO2 95 %.  GEN:  Well nourished, well developed in no acute distress HEENT: Normal NECK: No JVD; No  carotid bruits LYMPHATICS: No lymphadenopathy CARDIAC: RRR ,  Soft systolic murmur  RESPIRATORY:  Clear to auscultation without rales, wheezing or rhonchi  ABDOMEN: Soft, non-tender, non-distended MUSCULOSKELETAL:  No edema; No deformity  SKIN: Warm and dry NEUROLOGIC:  Alert and oriented x 3    Accessory Clinical Findings    ECG   :     Assessment & Plan    1.  Cryptogenic stroke: Has a history of a stroke.  Has an implantable loop in.      2. Essential hypertension:       Control her blood pressure is been a challenge.  She is had a stroke and so has issues with incontinence if we use a diuretic.  She is having lots of leg swelling with the amlodipine 10 mg a day.  We will discontinue the amlodipine and try her on diltiazem 180 mg a day. She is on max dose irbesartan.  We may consider changing the Irbesartan  to telmisartan in the future.  We also could add hydralazine or Cardura in the future. My hope is to control her BP without a diuretic since she has incontenence.   4. Hyperlipidemia:    Stable.   5. CAD -  No angina    Mertie Moores, MD  05/11/2019 3:50 PM    [place],  [place], [place]  [address] Pager [phone] Phone: [phone]; Fax: [phone]

## 2019-05-11 NOTE — Patient Instructions (Addendum)
Medication Instructions:  Your physician has recommended you make the following change in your medication:  STOP Amlodipine (Norvasc) START Diltiazem (Cardizem) 180 mg one time per day  If you need a refill on your cardiac medications before your next appointment, please call your pharmacy.    Lab work: None Ordered   Testing/Procedures: None Ordered   Follow-Up: Your physician recommends that you return for  a follow-up appointment on January 13 at 11:45 am with Richardson Dopp, PA a provider on Dr. Elmarie Shiley team

## 2019-05-12 DIAGNOSIS — R269 Unspecified abnormalities of gait and mobility: Secondary | ICD-10-CM | POA: Diagnosis not present

## 2019-05-12 DIAGNOSIS — G20 Parkinson's disease: Secondary | ICD-10-CM | POA: Diagnosis not present

## 2019-05-16 ENCOUNTER — Other Ambulatory Visit: Payer: Self-pay

## 2019-05-16 ENCOUNTER — Ambulatory Visit: Payer: PPO | Admitting: Physical Therapy

## 2019-05-16 DIAGNOSIS — M6281 Muscle weakness (generalized): Secondary | ICD-10-CM | POA: Diagnosis not present

## 2019-05-16 DIAGNOSIS — R262 Difficulty in walking, not elsewhere classified: Secondary | ICD-10-CM

## 2019-05-16 DIAGNOSIS — R2689 Other abnormalities of gait and mobility: Secondary | ICD-10-CM

## 2019-05-16 NOTE — Therapy (Signed)
[place], [place], [address] Phone: [phone]   Fax:  [phone]  Physical Therapy Treatment  Patient Details  Name: Stacey Ramirez MRN: [id number] Date of Birth: [birth year] Referring Provider (PT): Pharr   Encounter Date: 05/16/2019  PT End of Session - 05/16/19 1547    Visit Number  [id number]    PT Start Time  0321    PT Stop Time  2248    PT Time Calculation (min)  50 min       Past Medical History:  Diagnosis Date  . Adjustment reaction with anxiety 12/20/2015  . Aortic stenosis   . CAD (coronary artery disease) 06/09/2017   Nuc study 10/18: EF 69, inferolateral perfusion defect-possible small infarct with peri-infarct ischemia; intermediate risk // LHC 10/18: pLAD 25, pLCx 50, pRCA 25 >> med Rx  . Carotid stenosis, asymptomatic, bilateral 02/27/2016   Carotid US 5/17: Bilateral ICA 1-39  . Cerebrovascular accident (stroke) ([place])    a. 12/2015 - cryptogenic.  S/P MDT Linq.  . Difficulty walking   . Essential hypertension   . Gait disorder 04/07/2016  . Heart palpitations 09/06/2012  . History of CVA with residual deficit 12/14/2015  . History of dizziness   . History of echocardiogram    Echo 10/18: Vigorous LVF, EF 65-70, normal wall motion, grade 1 diastolic dysfunction, GLS -21.1%, mild RAE  . History of nuclear stress test    Nuclear stress test 10/18: EF 69, inf-lat defect c/w poss infarct with peri-infarct ischemia; Intermediate Risk  . Hyperlipidemia   . Hypothyroidism   . Left hemiparesis ([place])   . LVH (left ventricular hypertrophy)   . Mitral regurgitation    a. 12/2015 Echo: EF 60-65%, mild focal basal hypertrophy. No rwma, triv AI, mild MR, mildly dil LA w/o thrombus. No RA thrombus. No PFO.  Marland Kitchen Palpitations   . Prediabetes   . Stroke-like symptom 12/13/2015  . SVT (supraventricular tachycardia) ([place]) 12/14/2015  . Vascular parkinsonism ([place]) 06/29/2016  . Vitamin D deficiency      Past Surgical History:  Procedure Laterality Date  . CARDIOVASCULAR STRESS TEST  2002   NORMAL  . EP IMPLANTABLE DEVICE N/A 12/17/2015   Procedure: Loop Recorder Insertion;  Surgeon: [name], MD;  Location: [facility];  Service: Cardiovascular;  Laterality: N/A;  . LEFT HEART CATH AND CORONARY ANGIOGRAPHY N/A 05/25/2017   Procedure: LEFT HEART CATH AND CORONARY ANGIOGRAPHY;  Surgeon: Sherren Mocha, MD;  Location: [facility];  Service: Cardiovascular;  Laterality: N/A;  . LOOP RECORDER IMPLANT    . TEE WITHOUT CARDIOVERSION N/A 12/17/2015   Procedure: TRANSESOPHAGEAL ECHOCARDIOGRAM (TEE);  Surgeon: Lelon Perla, MD;  Location: [facility];  Service: Cardiovascular;  Laterality: N/A;  Pt also needs a LOOP  . TRANSTHORACIC ECHOCARDIOGRAM  2008   SHOWED LEFT VENTRICULAR HYPERTROPHY AND MILD AORTIC STENOSIS    There were no vitals filed for this visit.  Subjective Assessment - 05/16/19 1400    Subjective  "feeling a little tried today" Pt stated she has a hard time getting her legs moving in the morning. Pt denies pain.    Patient is accompained by:  Family member    Pertinent History  Parkinson's, multiple falls    Patient Stated Goals  decrease the falls, feel stronger    Currently in Pain?  No/denies  [facility] Adult PT Treatment/Exercise - 05/16/19 0001      High Level Balance   High Level Balance Comments  ball toss with and without movement. min A with verbal and tactile cuing.       Lumbar Exercises: Aerobic   Nustep  L5 6 min      Lumbar Exercises: Machines for Strengthening   Cybex Knee Extension  15# 2x10    Cybex Knee Flexion  25# 2x10      Knee/Hip Exercises: Aerobic   Tread Mill  5 min 1 mph     Nustep  L 3 6 min      Knee/Hip Exercises: Standing   Hip Abduction  Stengthening;Both;2 sets;10 reps   2.5#   Hip Extension  Stengthening;2 sets;10 reps;Knee straight   2.5#   Forward Step Up  Both;1 set;10  reps;Hand Hold: 2;Step Height: 6"    Other Standing Knee Exercises  standing marching 10x2               PT Short Term Goals - 08/25/18 1153      PT SHORT TERM GOAL #1   Title  independent with her home HEP    Status  Achieved        PT Long Term Goals - 04/25/19 1254      PT LONG TERM GOAL #1   Title  TUG with RW 23.8 sec    Baseline  30.8 sec with RW    Status  Partially Met      PT LONG TERM GOAL #3   Title  increase LE strength (particularly ABD and ext.) to 4/5 for functional gait and safety    Baseline  4/5 hips and knees tested in sitting except BIL hip flexors 4-/5    Status  Partially Met      PT LONG TERM GOAL #4   Title  be able to do a sit to stand without assistance and without losing balance for functional use    Status  Partially Met            Plan - 05/16/19 1549    Clinical Impression Statement  Pt needs verbal and tacile cues with balance exercises. Pt requires verbal cues to take big steps with the LLE. Pt able to perform ball toss with no LOB and progressed to ball toss with movement with verbal and tactile cues. Pt continues to increase her righting reactions.    Rehab Potential  Good    PT Frequency  2x / week    PT Treatment/Interventions  ADLs/Self Care Home Management;Electrical Stimulation;Moist Heat;Gait training;Stair training;Therapeutic activities;Functional mobility training;Therapeutic exercise;Patient/family education;Neuromuscular re-education;Balance training    PT Next Visit Plan  assess goals including TUG and BERG and possible discaharge       Patient will benefit from skilled therapeutic intervention in order to improve the following deficits and impairments:  Abnormal gait, Pain, Postural dysfunction, Increased muscle spasms, Cardiopulmonary status limiting activity, Decreased activity tolerance, Decreased strength, Difficulty walking, Decreased balance  Visit Diagnosis: Difficulty in walking, not elsewhere  classified  Balance problem  Muscle weakness (generalized)     Problem List Patient Active Problem List   Diagnosis Date Noted  . Familial hypercholesteremia 02/21/2018  . Psychogenic gait 10/21/2017  . Coronary artery disease involving native coronary artery of native heart without angina pectoris 06/09/2017  . Abnormal nuclear cardiac imaging test 05/25/2017  . PVC's (premature ventricular contractions) 05/17/2017  . Anxiety 12/29/2016  . Vascular parkinsonism ([place]) 06/29/2016  . Gait  disorder 04/07/2016  . Intracranial carotid stenosis, bilateral 02/27/2016  . Essential hypertension   . Left-sided weakness 12/28/2015  . Headache 12/28/2015  . Adjustment reaction with anxiety 12/20/2015  . Left hemiparesis ([place])   . History of CVA with residual deficit   . Prediabetes   . SVT (supraventricular tachycardia) ([place]) 12/14/2015  . Stroke-like symptom 12/13/2015  . History of stroke   . Heart palpitations 09/06/2012  . Hypertension   . Familial hyperlipidemia   . Hypothyroidism   . Vitamin D deficiency   . Palpitations   . History of dizziness   . LVH (left ventricular hypertrophy)   . Aortic stenosis   . Mitral regurgitation   . SOB (shortness of breath)   . Difficulty walking     [place], [place] 05/16/2019, 3:58 PM  [place], [place], [address] Phone: [phone]   Fax:  [phone]  Name: Stacey Ramirez MRN: [id number] Date of Birth: March 12, 1941

## 2019-05-18 ENCOUNTER — Ambulatory Visit: Payer: PPO | Admitting: Physical Therapy

## 2019-05-18 ENCOUNTER — Other Ambulatory Visit: Payer: Self-pay

## 2019-05-18 DIAGNOSIS — R262 Difficulty in walking, not elsewhere classified: Secondary | ICD-10-CM

## 2019-05-18 DIAGNOSIS — M6281 Muscle weakness (generalized): Secondary | ICD-10-CM

## 2019-05-18 NOTE — Therapy (Addendum)
[place], [place], [address] Phone: [phone]   Fax:  [phone] During this treatment session, the therapist was present, participating in and directing the treatment. Physical Therapy Treatment  Patient Details  Name: Stacey Ramirez MRN: [id number] Date of Birth: 1941/04/30 Referring Provider (PT): Pharr   Encounter Date: 05/18/2019  PT End of Session - 05/18/19 1649    Visit Number  82    PT Start Time  8937    PT Stop Time  3428    PT Time Calculation (min)  45 min       Past Medical History:  Diagnosis Date  . Adjustment reaction with anxiety 12/20/2015  . Aortic stenosis   . CAD (coronary artery disease) 06/09/2017   Nuc study 10/18: EF 69, inferolateral perfusion defect-possible small infarct with peri-infarct ischemia; intermediate risk // LHC 10/18: pLAD 25, pLCx 50, pRCA 25 >> med Rx  . Carotid stenosis, asymptomatic, bilateral 02/27/2016   Carotid US 5/17: Bilateral ICA 1-39  . Cerebrovascular accident (stroke) ([place])    a. 12/2015 - cryptogenic.  S/P MDT Linq.  . Difficulty walking   . Essential hypertension   . Gait disorder 04/07/2016  . Heart palpitations 09/06/2012  . History of CVA with residual deficit 12/14/2015  . History of dizziness   . History of echocardiogram    Echo 10/18: Vigorous LVF, EF 65-70, normal wall motion, grade 1 diastolic dysfunction, GLS -21.1%, mild RAE  . History of nuclear stress test    Nuclear stress test 10/18: EF 69, inf-lat defect c/w poss infarct with peri-infarct ischemia; Intermediate Risk  . Hyperlipidemia   . Hypothyroidism   . Left hemiparesis ([place])   . LVH (left ventricular hypertrophy)   . Mitral regurgitation    a. 12/2015 Echo: EF 60-65%, mild focal basal hypertrophy. No rwma, triv AI, mild MR, mildly dil LA w/o thrombus. No RA thrombus. No PFO.  Marland Kitchen Palpitations   . Prediabetes   . Stroke-like symptom 12/13/2015  . SVT (supraventricular  tachycardia) ([place]) 12/14/2015  . Vascular parkinsonism ([place]) 06/29/2016  . Vitamin D deficiency     Past Surgical History:  Procedure Laterality Date  . CARDIOVASCULAR STRESS TEST  2002   NORMAL  . EP IMPLANTABLE DEVICE N/A 12/17/2015   Procedure: Loop Recorder Insertion;  Surgeon: [name], MD;  Location: [facility];  Service: Cardiovascular;  Laterality: N/A;  . LEFT HEART CATH AND CORONARY ANGIOGRAPHY N/A 05/25/2017   Procedure: LEFT HEART CATH AND CORONARY ANGIOGRAPHY;  Surgeon: Sherren Mocha, MD;  Location: [facility];  Service: Cardiovascular;  Laterality: N/A;  . LOOP RECORDER IMPLANT    . TEE WITHOUT CARDIOVERSION N/A 12/17/2015   Procedure: TRANSESOPHAGEAL ECHOCARDIOGRAM (TEE);  Surgeon: Lelon Perla, MD;  Location: [facility] END[facility]COPY;  Service: Cardiovascular;  Laterality: N/A;  Pt also needs a LOOP  . TRANSTHORACIC ECHOCARDIOGRAM  2008   SHOWED LEFT VENTRICULAR HYPERTROPHY AND MILD AORTIC STENOSIS    There were no vitals filed for this visit.  Subjective Assessment - 05/18/19 1402    Subjective  Pt reports some pain in the L hip after prolonged sitting.    Patient is accompained by:  Family member    Currently in Pain?  Yes    Pain Score  1                        [facility] Adult PT Treatment/Exercise - 05/18/19  0001      High Level Balance   High Level Balance Activities  Negotitating around obstacles;Tandem walking;Backward walking;Marching forwards      Lumbar Exercises: Aerobic   Tread Mill  1 mph 5 min CGA - working to increase strid ean dcadeance    Nustep  L5 6 min      Lumbar Exercises: Machines for Strengthening   Cybex Knee Extension  15# 2x10    Cybex Knee Flexion  25# 2x10      Knee/Hip Exercises: Standing   Other Standing Knee Exercises  alt box taps 2x10               PT Short Term Goals - 08/25/18 1153      PT SHORT TERM GOAL #1   Title  independent with her home HEP    Status  Achieved        PT  Long Term Goals - 05/18/19 1648      PT LONG TERM GOAL #1   Title  TUG with RW 23.8 sec    Status  Not Met      PT LONG TERM GOAL #3   Title  increase LE strength (particularly ABD and ext.) to 4/5 for functional gait and safety    Status  Partially Met      PT LONG TERM GOAL #4   Title  be able to do a sit to stand without assistance and without losing balance for functional use    Status  Partially Met            Plan - 05/18/19 1653    Clinical Impression Statement  Pt able to negoaiate around cones without her walker. Pt continues to need cues to pick up left leg. pt needs verbal and tactile cues for balance exercises.    Rehab Potential  Poor    PT Frequency  1x / week    PT Treatment/Interventions  ADLs/Self Care Home Management;Electrical Stimulation;Moist Heat;Gait training;Stair training;Therapeutic activities;Functional mobility training;Therapeutic exercise;Patient/family education;Neuromuscular re-education;Balance training       Patient will benefit from skilled therapeutic intervention in order to improve the following deficits and impairments:  Abnormal gait, Pain, Postural dysfunction, Increased muscle spasms, Cardiopulmonary status limiting activity, Decreased activity tolerance, Decreased strength, Difficulty walking, Decreased balance  Visit Diagnosis: Difficulty in walking, not elsewhere classified  Muscle weakness (generalized)     Problem List Patient Active Problem List   Diagnosis Date Noted  . Familial hypercholesteremia 02/21/2018  . Psychogenic gait 10/21/2017  . Coronary artery disease involving native coronary artery of native heart without angina pectoris 06/09/2017  . Abnormal nuclear cardiac imaging test 05/25/2017  . PVC's (premature ventricular contractions) 05/17/2017  . Anxiety 12/29/2016  . Vascular parkinsonism ([place]) 06/29/2016  . Gait disorder 04/07/2016  . Intracranial carotid stenosis, bilateral 02/27/2016  . Essential  hypertension   . Left-sided weakness 12/28/2015  . Headache 12/28/2015  . Adjustment reaction with anxiety 12/20/2015  . Left hemiparesis ([place])   . History of CVA with residual deficit   . Prediabetes   . SVT (supraventricular tachycardia) ([place]) 12/14/2015  . Stroke-like symptom 12/13/2015  . History of stroke   . Heart palpitations 09/06/2012  . Hypertension   . Familial hyperlipidemia   . Hypothyroidism   . Vitamin D deficiency   . Palpitations   . History of dizziness   . LVH (left ventricular hypertrophy)   . Aortic stenosis   . Mitral regurgitation   . SOB (shortness of breath)   .  Difficulty walking    Lum Babe, [place] 05/18/2019, 4:56 PM  [place] [address] [place], [place], [address] Phone: [phone]   Fax:  [phone]  Name: Stacey Ramirez MRN: [id number] Date of Birth: July 22, 1941

## 2019-05-29 ENCOUNTER — Ambulatory Visit (INDEPENDENT_AMBULATORY_CARE_PROVIDER_SITE_OTHER): Payer: PPO | Admitting: *Deleted

## 2019-05-29 DIAGNOSIS — I471 Supraventricular tachycardia: Secondary | ICD-10-CM | POA: Diagnosis not present

## 2019-05-30 LAB — CUP PACEART REMOTE DEVICE CHECK
Date Time Interrogation Session: 20201025201219
Implantable Pulse Generator Implant Date: 20170516

## 2019-05-31 DIAGNOSIS — N390 Urinary tract infection, site not specified: Secondary | ICD-10-CM | POA: Diagnosis not present

## 2019-05-31 DIAGNOSIS — R8290 Unspecified abnormal findings in urine: Secondary | ICD-10-CM | POA: Diagnosis not present

## 2019-06-12 DIAGNOSIS — G20 Parkinson's disease: Secondary | ICD-10-CM | POA: Diagnosis not present

## 2019-06-12 DIAGNOSIS — R269 Unspecified abnormalities of gait and mobility: Secondary | ICD-10-CM | POA: Diagnosis not present

## 2019-06-16 NOTE — Progress Notes (Signed)
Carelink Summary Report / Loop Recorder 

## 2019-06-19 ENCOUNTER — Telehealth: Payer: Self-pay | Admitting: Student

## 2019-06-19 NOTE — Telephone Encounter (Signed)
LMOM  Needs to schedule appointment for linq reprogramming. (Non-urgent)  Due to false episodes of AF (NSR with ectopy)  Lollie Marrow, PA-C  06/19/2019 9:22 AM

## 2019-06-19 NOTE — Telephone Encounter (Signed)
Discussed with pt husband.  They had previously discussing having it taken out once it reached ERI (which should be soon), but not would just like to leave it in.   In this setting, will not adjust settings at this time, and continue to follow normally until reaches ERI.    Device placed 12/2015, so should be very soon.   Legrand Como [address]" [place], PA-C  06/19/2019 9:28 AM

## 2019-06-22 ENCOUNTER — Telehealth: Payer: Self-pay | Admitting: Nurse Practitioner

## 2019-06-22 NOTE — Telephone Encounter (Signed)
Spoke with patient's husband who called for guidance on switching the patient's medication schedule to taking diltiazem in the morning and irbesartan in the evening because patient has been taking both in the morning and he is worried that if she goes too long without the medication that her BP will be too high. I advised him to split her irbesartan tomorrow and give her 1/2 in the morning and 1/2 in the evening and then to start irbesartan 300 mg every evening the following day. He states patient was taken off the carbidopa-levodopa because it was not helping her. She has been advised to start modafinil by her neurologist to help give her more energy. I advised that the medication will likely increase her HR and possibly her BP and asked husband to give [place] 5 days of BP readings on the new medicine schedule for diltiazem and irbesartan before starting modafinil. He verbalized understanding and agreement and thanked me for the call.

## 2019-06-22 NOTE — Telephone Encounter (Signed)
Left message for patient or husband to call the office in regards to MyChart message that was received regarding patient's BP readings.

## 2019-06-27 ENCOUNTER — Other Ambulatory Visit: Payer: Self-pay | Admitting: Nurse Practitioner

## 2019-06-27 MED ORDER — NEBIVOLOL HCL 2.5 MG PO TABS: 2.5000 mg | ORAL_TABLET | Freq: Every day | ORAL | 11 refills | Status: DC

## 2019-06-27 NOTE — Progress Notes (Signed)
bys

## 2019-06-28 ENCOUNTER — Ambulatory Visit (INDEPENDENT_AMBULATORY_CARE_PROVIDER_SITE_OTHER): Payer: PPO | Admitting: *Deleted

## 2019-06-28 DIAGNOSIS — I6930 Unspecified sequelae of cerebral infarction: Secondary | ICD-10-CM | POA: Diagnosis not present

## 2019-07-02 LAB — CUP PACEART REMOTE DEVICE CHECK
Date Time Interrogation Session: 20201127150908
Implantable Pulse Generator Implant Date: 20170516

## 2019-07-12 DIAGNOSIS — R269 Unspecified abnormalities of gait and mobility: Secondary | ICD-10-CM | POA: Diagnosis not present

## 2019-07-12 DIAGNOSIS — G20 Parkinson's disease: Secondary | ICD-10-CM | POA: Diagnosis not present

## 2019-07-31 ENCOUNTER — Ambulatory Visit (INDEPENDENT_AMBULATORY_CARE_PROVIDER_SITE_OTHER): Payer: PPO | Admitting: *Deleted

## 2019-07-31 DIAGNOSIS — I471 Supraventricular tachycardia: Secondary | ICD-10-CM | POA: Diagnosis not present

## 2019-08-02 LAB — CUP PACEART REMOTE DEVICE CHECK
Date Time Interrogation Session: 20201229100956
Implantable Pulse Generator Implant Date: 20170516

## 2019-08-10 ENCOUNTER — Encounter: Payer: Self-pay | Admitting: Cardiology

## 2019-08-10 ENCOUNTER — Ambulatory Visit (INDEPENDENT_AMBULATORY_CARE_PROVIDER_SITE_OTHER): Payer: PPO | Admitting: Cardiology

## 2019-08-10 ENCOUNTER — Other Ambulatory Visit: Payer: Self-pay

## 2019-08-10 VITALS — BP 140/70 | HR 72 | Ht 62.0 in | Wt 123.8 lb

## 2019-08-10 DIAGNOSIS — I2510 Atherosclerotic heart disease of native coronary artery without angina pectoris: Secondary | ICD-10-CM | POA: Diagnosis not present

## 2019-08-10 DIAGNOSIS — Z8673 Personal history of transient ischemic attack (TIA), and cerebral infarction without residual deficits: Secondary | ICD-10-CM | POA: Diagnosis not present

## 2019-08-10 DIAGNOSIS — E785 Hyperlipidemia, unspecified: Secondary | ICD-10-CM

## 2019-08-10 DIAGNOSIS — I10 Essential (primary) hypertension: Secondary | ICD-10-CM

## 2019-08-10 MED ORDER — DOXAZOSIN MESYLATE 1 MG PO TABS: 1.0000 mg | ORAL_TABLET | Freq: Every day | ORAL | 3 refills | Status: DC

## 2019-08-10 NOTE — Patient Instructions (Addendum)
Medication Instructions:  Start taking Doxazosin (Cardura) 1 MG daily at bedtime   *If you need a refill on your cardiac medications before your next appointment, please call your pharmacy*  Lab Work: None ordered   If you have labs (blood work) drawn today and your tests are completely normal, you will receive your results only by: Marland Kitchen MyChart Message (if you have MyChart) OR . A paper copy in the mail If you have any lab test that is abnormal or we need to change your treatment, we will call you to review the results.  Testing/Procedures: You are scheduled to see the lipid clinic on 08/16/2019 @ 3:20 PM  Follow-Up: You are scheduled to see Dr. Acie Fredrickson on 11/10/2019 @ 3:30 PM  Other Instructions  Low-Sodium Eating Plan Sodium, which is an element that makes up salt, helps you maintain a healthy balance of fluids in your body. Too much sodium can increase your blood pressure and cause fluid and waste to be held in your body. Your health care provider or dietitian may recommend following this plan if you have high blood pressure (hypertension), kidney disease, liver disease, or heart failure. Eating less sodium can help lower your blood pressure, reduce swelling, and protect your heart, liver, and kidneys. What are tips for following this plan? General guidelines  Most people on this plan should limit their sodium intake to 1,500-2,000 mg (milligrams) of sodium each day. Reading food labels   The Nutrition Facts label lists the amount of sodium in one serving of the food. If you eat more than one serving, you must multiply the listed amount of sodium by the number of servings.  Choose foods with less than 140 mg of sodium per serving.  Avoid foods with 300 mg of sodium or more per serving. Shopping  Look for lower-sodium products, often labeled as "low-sodium" or "no salt added."  Always check the sodium content even if foods are labeled as "unsalted" or "no salt added".  Buy fresh  foods. ? Avoid canned foods and premade or frozen meals. ? Avoid canned, cured, or processed meats  Buy breads that have less than 80 mg of sodium per slice. Cooking  Eat more home-cooked food and less restaurant, buffet, and fast food.  Avoid adding salt when cooking. Use salt-free seasonings or herbs instead of table salt or sea salt. Check with your health care provider or pharmacist before using salt substitutes.  Cook with plant-based oils, such as canola, sunflower, or olive oil. Meal planning  When eating at a restaurant, ask that your food be prepared with less salt or no salt, if possible.  Avoid foods that contain MSG (monosodium glutamate). MSG is sometimes added to [place] food, bouillon, and some canned foods. What foods are recommended? The items listed may not be a complete list. Talk with your dietitian about what dietary choices are best for you. Grains Low-sodium cereals, including oats, puffed wheat and rice, and shredded wheat. Low-sodium crackers. Unsalted rice. Unsalted pasta. Low-sodium bread. Whole-grain breads and whole-grain pasta. Vegetables Fresh or frozen vegetables. "No salt added" canned vegetables. "No salt added" tomato sauce and paste. Low-sodium or reduced-sodium tomato and vegetable juice. Fruits Fresh, frozen, or canned fruit. Fruit juice. Meats and other protein foods Fresh or frozen (no salt added) meat, poultry, seafood, and fish. Low-sodium canned tuna and salmon. Unsalted nuts. Dried peas, beans, and lentils without added salt. Unsalted canned beans. Eggs. Unsalted nut butters. Dairy Milk. Soy milk. Cheese that is naturally low in  sodium, such as ricotta cheese, fresh mozzarella, or Swiss cheese Low-sodium or reduced-sodium cheese. Cream cheese. Yogurt. Fats and oils Unsalted butter. Unsalted margarine with no trans fat. Vegetable oils such as canola or olive oils. Seasonings and other foods Fresh and dried herbs and spices. Salt-free  seasonings. Low-sodium mustard and ketchup. Sodium-free salad dressing. Sodium-free light mayonnaise. Fresh or refrigerated horseradish. Lemon juice. Vinegar. Homemade, reduced-sodium, or low-sodium soups. Unsalted popcorn and pretzels. Low-salt or salt-free chips. What foods are not recommended? The items listed may not be a complete list. Talk with your dietitian about what dietary choices are best for you. Grains Instant hot cereals. Bread stuffing, pancake, and biscuit mixes. Croutons. Seasoned rice or pasta mixes. Noodle soup cups. Boxed or frozen macaroni and cheese. Regular salted crackers. Self-rising flour. Vegetables Sauerkraut, pickled vegetables, and relishes. Olives. [place] fries. Onion rings. Regular canned vegetables (not low-sodium or reduced-sodium). Regular canned tomato sauce and paste (not low-sodium or reduced-sodium). Regular tomato and vegetable juice (not low-sodium or reduced-sodium). Frozen vegetables in sauces. Meats and other protein foods Meat or fish that is salted, canned, smoked, spiced, or pickled. Bacon, ham, sausage, hotdogs, corned beef, chipped beef, packaged lunch meats, salt pork, jerky, pickled herring, anchovies, regular canned tuna, sardines, salted nuts. Dairy Processed cheese and cheese spreads. Cheese curds. Blue cheese. Feta cheese. String cheese. Regular cottage cheese. Buttermilk. Canned milk. Fats and oils Salted butter. Regular margarine. Ghee. Bacon fat. Seasonings and other foods Onion salt, garlic salt, seasoned salt, table salt, and sea salt. Canned and packaged gravies. Worcestershire sauce. Tartar sauce. Barbecue sauce. Teriyaki sauce. Soy sauce, including reduced-sodium. Steak sauce. Fish sauce. Oyster sauce. Cocktail sauce. Horseradish that you find on the shelf. Regular ketchup and mustard. Meat flavorings and tenderizers. Bouillon cubes. Hot sauce and Tabasco sauce. Premade or packaged marinades. Premade or packaged taco seasonings. Relishes.  Regular salad dressings. Salsa. Potato and tortilla chips. Corn chips and puffs. Salted popcorn and pretzels. Canned or dried soups. Pizza. Frozen entrees and pot pies. Summary  Eating less sodium can help lower your blood pressure, reduce swelling, and protect your heart, liver, and kidneys.  Most people on this plan should limit their sodium intake to 1,500-2,000 mg (milligrams) of sodium each day.  Canned, boxed, and frozen foods are high in sodium. Restaurant foods, fast foods, and pizza are also very high in sodium. You also get sodium by adding salt to food.  Try to cook at home, eat more fresh fruits and vegetables, and eat less fast food, canned, processed, or prepared foods. This information is not intended to replace advice given to you by your health care provider. Make sure you discuss any questions you have with your health care provider. Document Revised: 07/02/2017 Document Reviewed: 07/13/2016 Elsevier Patient Education  2020 [employer].

## 2019-08-10 NOTE — Progress Notes (Signed)
Cardiology Office Note:    Date:  08/10/2019   ID:  Stacey Ramirez, DOB Jun 22, 1941, MRN [id number]  PCP:  Deland Pretty, MD  Cardiologist:  Mertie Moores, MD  Referring MD: Deland Pretty, MD   Chief Complaint  Patient presents with  . Hypertension    History of Present Illness:    Stacey Ramirez is a 79 y.o. female with a past medical history significant for palpitations, hypertension, hyperlipidemia and stroke.  She was previously followed by Dr. Mare Ferrari and is now followed by Dr. Acie Fredrickson.  She has a history of cryptogenic stroke in 2017 with no atrial thrombus on TEE.  She had a subsequent loop recorder which showed no events.  She has had difficulty walking related to her stroke with history of falls.  She had a nuclear stress test in 05/2017 showed possible infarct with peri-infarct ischemia.  Cardiac cath in 05/2017 showed proximal LAD 25%, proximal left circumflex 50%, proximal RCA 25%.  She has been treated medically.  Stacey Ramirez was last seen in the office on 05/11/2019 by Dr. Acie Fredrickson.  She has some mild edema felt to be related to amlodipine.  Control of her blood pressure has been a challenge.  She has had incontinence and unable to use diuretic.  Amlodipine was discontinued for trial of diltiazem 180 mg.  She is on max dose of irbesartan.  Dr. Acie Fredrickson considered changing irbesartan to telmisartan if needed or adding hydralazine or Cardura in the future.  The patient's husband has been communicating through MyChart about the patient's blood pressure.  Apparently she had high blood pressures in the morning but were controlled during the day.  Dr. Acie Fredrickson advised that this may be just a physiologic response and would continue current therapy.  The patient is here today for follow-up of blood pressure issues with her husband.  He keeps very good track of her blood pressure.  He has noted that personally in the morning her blood pressures are in the 160s-180s/80s-100.  He has also  occasionally checked her blood pressure during the night when she gets up to go to the bathroom and it is high.  Later during the day after she takes her medications her blood pressure is mostly running 130s over 70s.  With her history of stroke her husband is concerned about stroke prevention.  Patient's husband says that her blood pressure was better controlled when she was on amlodipine 10 mg however this caused a lot of lower extremity edema.  Now that she is off the amlodipine her leg swelling is reportedly much better, still mild ankle edema.   He has been giving her irbesartan at night and diltiazem and Bystolic during the day.  He wonders if the irbesartan is not working.  She has been on telmisartan in the past but had profound weakness with this.  I feel that stopping the amlodipine 10 mg has caused her to have some higher blood pressures.  I do not think we could increase the diltiazem or Bystolic as the patient's husband reports that her heart rate often goes down into the 50s.   Cardiac studies   LEFT HEART CATH AND CORONARY ANGIOGRAPHY 05/25/2017  Conclusion  1. Moderate nonobstructive disease of the left circumflex 2. Widely patent left main 3. Mild nonobstructive disease in the LAD and RCA 4. Normal LVEDP  Suspect noncardiac symptoms.     Past Medical History:  Diagnosis Date  . Adjustment reaction with anxiety 12/20/2015  . Aortic stenosis   .  CAD (coronary artery disease) 06/09/2017   Nuc study 10/18: EF 69, inferolateral perfusion defect-possible small infarct with peri-infarct ischemia; intermediate risk // LHC 10/18: pLAD 25, pLCx 50, pRCA 25 >> med Rx  . Carotid stenosis, asymptomatic, bilateral 02/27/2016   Carotid US 5/17: Bilateral ICA 1-39  . Cerebrovascular accident (stroke) ([place])    a. 12/2015 - cryptogenic.  S/P MDT Linq.  . Difficulty walking   . Essential hypertension   . Gait disorder 04/07/2016  . Heart palpitations 09/06/2012  . History of CVA with  residual deficit 12/14/2015  . History of dizziness   . History of echocardiogram    Echo 10/18: Vigorous LVF, EF 65-70, normal wall motion, grade 1 diastolic dysfunction, GLS -21.1%, mild RAE  . History of nuclear stress test    Nuclear stress test 10/18: EF 69, inf-lat defect c/w poss infarct with peri-infarct ischemia; Intermediate Risk  . Hyperlipidemia   . Hypothyroidism   . Left hemiparesis ([place])   . LVH (left ventricular hypertrophy)   . Mitral regurgitation    a. 12/2015 Echo: EF 60-65%, mild focal basal hypertrophy. No rwma, triv AI, mild MR, mildly dil LA w/o thrombus. No RA thrombus. No PFO.  Marland Kitchen Palpitations   . Prediabetes   . Stroke-like symptom 12/13/2015  . SVT (supraventricular tachycardia) ([place]) 12/14/2015  . Vascular parkinsonism ([place]) 06/29/2016  . Vitamin D deficiency     Past Surgical History:  Procedure Laterality Date  . CARDIOVASCULAR STRESS TEST  2002   NORMAL  . EP IMPLANTABLE DEVICE N/A 12/17/2015   Procedure: Loop Recorder Insertion;  Surgeon: [name], MD;  Location: [place] [facility];  Service: Cardiovascular;  Laterality: N/A;  . LEFT HEART CATH AND CORONARY ANGIOGRAPHY N/A 05/25/2017   Procedure: LEFT HEART CATH AND CORONARY ANGIOGRAPHY;  Surgeon: Sherren Mocha, MD;  Location: [place] [facility];  Service: Cardiovascular;  Laterality: N/A;  . LOOP RECORDER IMPLANT    . TEE WITHOUT CARDIOVERSION N/A 12/17/2015   Procedure: TRANSESOPHAGEAL ECHOCARDIOGRAM (TEE);  Surgeon: Lelon Perla, MD;  Location: [facility];  Service: Cardiovascular;  Laterality: N/A;  Pt also needs a LOOP  . TRANSTHORACIC ECHOCARDIOGRAM  2008   SHOWED LEFT VENTRICULAR HYPERTROPHY AND MILD AORTIC STENOSIS    Current Medications: Current Meds  Medication Sig  . ALPRAZolam (XANAX) 0.25 MG tablet Take 0.25 mg by mouth at bedtime as needed for anxiety or sleep.  . Calcium Citrate (CITRACAL PO) Take by mouth daily.  . carboxymethylcellulose (REFRESH PLUS) 0.5 % SOLN Place 1  drop into both eyes 3 (three) times daily as needed (dry eyes).   . Cholecalciferol (VITAMIN D) 2000 units tablet Take 2,000 Units by mouth daily.  . clopidogrel (PLAVIX) 75 MG tablet Take 1 tablet (75 mg total) by mouth daily.  Marland Kitchen diltiazem (CARDIZEM CD) 180 MG 24 hr capsule Take 1 capsule (180 mg total) by mouth daily.  Marland Kitchen docusate sodium (COLACE) 100 MG capsule Take 200 mg by mouth daily.  . irbesartan (AVAPRO) 300 MG tablet Take 300 mg by mouth daily.   Marland Kitchen levothyroxine (SYNTHROID, LEVOTHROID) 25 MCG tablet Take 25 mcg by mouth daily before breakfast.   . loratadine (CLARITIN) 10 MG tablet Take 10 mg by mouth daily as needed for allergies.   . Multiple Vitamin (MULTIVITAMIN) tablet Take 1 tablet by mouth daily.  . nebivolol (BYSTOLIC) 2.5 MG tablet Take 1 tablet (2.5 mg total) by mouth daily.  . sertraline (ZOLOFT) 100 MG tablet Take 1 tablet by mouth daily.  Marland Kitchen  solifenacin (VESICARE) 5 MG tablet Take by mouth.     Allergies:   Sulfa drugs cross reactors, Zocor [simvastatin], Crestor [rosuvastatin calcium], Statins, Sulfa antibiotics, and Micardis [telmisartan]   Social History   Socioeconomic History  . Marital status: Married    Spouse name: Not on file  . Number of children: 2  . Years of education: college  . Highest education level: Not on file  Occupational History  . Occupation: retired  Tobacco Use  . Smoking status: Never Smoker  . Smokeless tobacco: Never Used  Substance and Sexual Activity  . Alcohol use: Yes    Alcohol/week: 1.0 standard drinks    Types: 1 Glasses of wine per week    Comment: socially  . Drug use: No  . Sexual activity: Not on file  Other Topics Concern  . Not on file  Social History Narrative  . Not on file   Social Determinants of Health   Financial Resource Strain:   . Difficulty of Paying Living Expenses: Not on file  Food Insecurity:   . Worried About Charity fundraiser in the Last Year: Not on file  . Ran Out of Food in the Last Year:  Not on file  Transportation Needs:   . Lack of Transportation (Medical): Not on file  . Lack of Transportation (Non-Medical): Not on file  Physical Activity:   . Days of Exercise per Week: Not on file  . Minutes of Exercise per Session: Not on file  Stress:   . Feeling of Stress : Not on file  Social Connections:   . Frequency of Communication with Friends and Family: Not on file  . Frequency of Social Gatherings with Friends and Family: Not on file  . Attends Religious Services: Not on file  . Active Member of Clubs or Organizations: Not on file  . Attends Archivist Meetings: Not on file  . Marital Status: Not on file     Family History: The patient's family history includes Alzheimer's disease in her mother; Heart attack in her mother; Stroke in her paternal uncle. ROS:   Please see the history of present illness.     All other systems reviewed and are negative.   EKG:  EKG is ordered today.  The ekg ordered today demonstrates normal sinus rhythm, 67 bpm, PVC  Recent Labs: No results found for requested labs within last 8760 hours.   Recent Lipid Panel    Component Value Date/Time   CHOL 385 (H) 09/13/2017 1453   TRIG 183 (H) 09/13/2017 1453   HDL 47 09/13/2017 1453   CHOLHDL 8.2 (H) 09/13/2017 1453   CHOLHDL 4.8 12/13/2015 1558   VLDL 12 12/13/2015 1558   LDLCALC 301 (H) 09/13/2017 1453   LDLDIRECT 224.6 12/14/2011 0919    Physical Exam:    VS:  BP 140/70   Pulse 72   Ht 5\' 2"  (1.575 m)   Wt 123 lb 12.8 oz (56.2 kg)   SpO2 94%   BMI 22.64 kg/m     Wt Readings from Last 6 Encounters:  08/10/19 123 lb 12.8 oz (56.2 kg)  05/11/19 122 lb 12.8 oz (55.7 kg)  09/13/18 120 lb 12.8 oz (54.8 kg)  08/08/18 120 lb (54.4 kg)  04/08/18 120 lb (54.4 kg)  04/05/18 115 lb (52.2 kg)     Physical Exam  Constitutional: She is oriented to person, place, and time. She appears well-developed and well-nourished. No distress.  HENT:  Head: Normocephalic and  atraumatic.  Neck: No JVD present.  Cardiovascular: Normal rate, regular rhythm, normal heart sounds and intact distal pulses. Exam reveals no gallop and no friction rub.  No murmur heard. Pulmonary/Chest: Effort normal and breath sounds normal. No respiratory distress. She has no wheezes. She has no rales.  Abdominal: Soft. Bowel sounds are normal.  Musculoskeletal:        General: Edema present.     Cervical back: Normal range of motion and neck supple.     Comments: Trace ankle edema  Neurological: She is alert and oriented to person, place, and time.  Skin: Skin is warm and dry.  Psychiatric: She has a normal mood and affect. Her behavior is normal. Judgment and thought content normal.  Vitals reviewed.   ASSESSMENT:    1. Essential (primary) hypertension   2. Hyperlipidemia, unspecified hyperlipidemia type   3. Coronary artery disease involving native coronary artery of native heart without angina pectoris   4. History of stroke    PLAN:    In order of problems listed above:  Hypertension -On irbesartan [id number] mg daily, Bystolic 2.5 mg daily, diltiazem 180 mg daily.  Previously on amlodipine that caused edema.  Unable to use diuretic due to issues with incontinence. -Patient is currently having mostly nocturnal high blood pressures 160s-180/80-100.  Daytime blood pressures are mostly well controlled in the 130s over 70s -After discussion with the patient and her husband, we decided to add Cardura 1 mg at bedtime. -Also advised on low sodium diet.  They feel like she does follow a fairly low-sodium diet but does eat a fair amount of prepackaged foods.  We reviewed reading labels. -We also discussed that he could move one of her morning medications to evening and see if that helps the situation.  He will see how the Cardura works and if needed he will move one of the daytime medicines to evening. -Her husband uses my chart and will notify us if blood pressures are still not well  controlled.  Hyperlipidemia -Patient with history of strokes. -Last lipid panel in 09/2017 showed TC 385, HDL 47, LDL 301, triglycerides 183. -Patient has not tolerated statins in the past.  Her husband thinks that she tried Zetia in the past but he does not know why it was stopped. -Patient's husband reports that Rapatha and Praluent prescribed by PCP did not have effects.  He also says that she had some genetic testing done in the past. -The patient and her husband are open to a referral to our lipid clinic for further management.  She may be a candidate for Nexletol.  CAD -Mild by cardiac cath -No angina  History of stroke -On Plavix -Patient has a loop recorder with no arrhythmias to date.    Medication Adjustments/Labs and Tests Ordered: Current medicines are reviewed at length with the patient today.  Concerns regarding medicines are outlined above. Labs and tests ordered and medication changes are outlined in the patient instructions below:  Patient Instructions  Medication Instructions:  Start taking Doxazosin (Cardura) 1 MG daily at bedtime   *If you need a refill on your cardiac medications before your next appointment, please call your pharmacy*  Lab Work: None ordered   If you have labs (blood work) drawn today and your tests are completely normal, you will receive your results only by: Marland Kitchen MyChart Message (if you have MyChart) OR . A paper copy in the mail If you have any lab test that is abnormal or we need to change your  treatment, we will call you to review the results.  Testing/Procedures: You are scheduled to see the lipid clinic on 08/16/2019 @ 3:20 PM  Follow-Up: You are scheduled to see Dr. Acie Fredrickson on 11/10/2019 @ 3:30 PM  Other Instructions  Low-Sodium Eating Plan Sodium, which is an element that makes up salt, helps you maintain a healthy balance of fluids in your body. Too much sodium can increase your blood pressure and cause fluid and waste to be held in  your body. Your health care provider or dietitian may recommend following this plan if you have high blood pressure (hypertension), kidney disease, liver disease, or heart failure. Eating less sodium can help lower your blood pressure, reduce swelling, and protect your heart, liver, and kidneys. What are tips for following this plan? General guidelines  Most people on this plan should limit their sodium intake to 1,500-2,000 mg (milligrams) of sodium each day. Reading food labels   The Nutrition Facts label lists the amount of sodium in one serving of the food. If you eat more than one serving, you must multiply the listed amount of sodium by the number of servings.  Choose foods with less than 140 mg of sodium per serving.  Avoid foods with 300 mg of sodium or more per serving. Shopping  Look for lower-sodium products, often labeled as "low-sodium" or "no salt added."  Always check the sodium content even if foods are labeled as "unsalted" or "no salt added".  Buy fresh foods. ? Avoid canned foods and premade or frozen meals. ? Avoid canned, cured, or processed meats  Buy breads that have less than 80 mg of sodium per slice. Cooking  Eat more home-cooked food and less restaurant, buffet, and fast food.  Avoid adding salt when cooking. Use salt-free seasonings or herbs instead of table salt or sea salt. Check with your health care provider or pharmacist before using salt substitutes.  Cook with plant-based oils, such as canola, sunflower, or olive oil. Meal planning  When eating at a restaurant, ask that your food be prepared with less salt or no salt, if possible.  Avoid foods that contain MSG (monosodium glutamate). MSG is sometimes added to [place] food, bouillon, and some canned foods. What foods are recommended? The items listed may not be a complete list. Talk with your dietitian about what dietary choices are best for you. Grains Low-sodium cereals, including oats,  puffed wheat and rice, and shredded wheat. Low-sodium crackers. Unsalted rice. Unsalted pasta. Low-sodium bread. Whole-grain breads and whole-grain pasta. Vegetables Fresh or frozen vegetables. "No salt added" canned vegetables. "No salt added" tomato sauce and paste. Low-sodium or reduced-sodium tomato and vegetable juice. Fruits Fresh, frozen, or canned fruit. Fruit juice. Meats and other protein foods Fresh or frozen (no salt added) meat, poultry, seafood, and fish. Low-sodium canned tuna and salmon. Unsalted nuts. Dried peas, beans, and lentils without added salt. Unsalted canned beans. Eggs. Unsalted nut butters. Dairy Milk. Soy milk. Cheese that is naturally low in sodium, such as ricotta cheese, fresh mozzarella, or Swiss cheese Low-sodium or reduced-sodium cheese. Cream cheese. Yogurt. Fats and oils Unsalted butter. Unsalted margarine with no trans fat. Vegetable oils such as canola or olive oils. Seasonings and other foods Fresh and dried herbs and spices. Salt-free seasonings. Low-sodium mustard and ketchup. Sodium-free salad dressing. Sodium-free light mayonnaise. Fresh or refrigerated horseradish. Lemon juice. Vinegar. Homemade, reduced-sodium, or low-sodium soups. Unsalted popcorn and pretzels. Low-salt or salt-free chips. What foods are not recommended? The items listed may not  be a complete list. Talk with your dietitian about what dietary choices are best for you. Grains Instant hot cereals. Bread stuffing, pancake, and biscuit mixes. Croutons. Seasoned rice or pasta mixes. Noodle soup cups. Boxed or frozen macaroni and cheese. Regular salted crackers. Self-rising flour. Vegetables Sauerkraut, pickled vegetables, and relishes. Olives. [place] fries. Onion rings. Regular canned vegetables (not low-sodium or reduced-sodium). Regular canned tomato sauce and paste (not low-sodium or reduced-sodium). Regular tomato and vegetable juice (not low-sodium or reduced-sodium). Frozen vegetables  in sauces. Meats and other protein foods Meat or fish that is salted, canned, smoked, spiced, or pickled. Bacon, ham, sausage, hotdogs, corned beef, chipped beef, packaged lunch meats, salt pork, jerky, pickled herring, anchovies, regular canned tuna, sardines, salted nuts. Dairy Processed cheese and cheese spreads. Cheese curds. Blue cheese. Feta cheese. String cheese. Regular cottage cheese. Buttermilk. Canned milk. Fats and oils Salted butter. Regular margarine. Ghee. Bacon fat. Seasonings and other foods Onion salt, garlic salt, seasoned salt, table salt, and sea salt. Canned and packaged gravies. Worcestershire sauce. Tartar sauce. Barbecue sauce. Teriyaki sauce. Soy sauce, including reduced-sodium. Steak sauce. Fish sauce. Oyster sauce. Cocktail sauce. Horseradish that you find on the shelf. Regular ketchup and mustard. Meat flavorings and tenderizers. Bouillon cubes. Hot sauce and Tabasco sauce. Premade or packaged marinades. Premade or packaged taco seasonings. Relishes. Regular salad dressings. Salsa. Potato and tortilla chips. Corn chips and puffs. Salted popcorn and pretzels. Canned or dried soups. Pizza. Frozen entrees and pot pies. Summary  Eating less sodium can help lower your blood pressure, reduce swelling, and protect your heart, liver, and kidneys.  Most people on this plan should limit their sodium intake to 1,500-2,000 mg (milligrams) of sodium each day.  Canned, boxed, and frozen foods are high in sodium. Restaurant foods, fast foods, and pizza are also very high in sodium. You also get sodium by adding salt to food.  Try to cook at home, eat more fresh fruits and vegetables, and eat less fast food, canned, processed, or prepared foods. This information is not intended to replace advice given to you by your health care provider. Make sure you discuss any questions you have with your health care provider. Document Revised: 07/02/2017 Document Reviewed: 07/13/2016 Elsevier  Patient Education  2020 [place], Daune Perch, NP  08/10/2019 1:31 PM    [facility]

## 2019-08-12 DIAGNOSIS — G20 Parkinson's disease: Secondary | ICD-10-CM | POA: Diagnosis not present

## 2019-08-12 DIAGNOSIS — R269 Unspecified abnormalities of gait and mobility: Secondary | ICD-10-CM | POA: Diagnosis not present

## 2019-08-14 ENCOUNTER — Ambulatory Visit: Payer: PPO | Admitting: Cardiology

## 2019-08-16 ENCOUNTER — Ambulatory Visit: Payer: PPO

## 2019-08-16 ENCOUNTER — Other Ambulatory Visit: Payer: Self-pay | Admitting: Cardiology

## 2019-08-16 ENCOUNTER — Ambulatory Visit: Payer: PPO | Admitting: Physician Assistant

## 2019-08-30 DIAGNOSIS — R531 Weakness: Secondary | ICD-10-CM | POA: Diagnosis not present

## 2019-08-30 DIAGNOSIS — G214 Vascular parkinsonism: Secondary | ICD-10-CM | POA: Diagnosis not present

## 2019-08-30 DIAGNOSIS — R269 Unspecified abnormalities of gait and mobility: Secondary | ICD-10-CM | POA: Diagnosis not present

## 2019-08-31 ENCOUNTER — Ambulatory Visit (INDEPENDENT_AMBULATORY_CARE_PROVIDER_SITE_OTHER): Payer: PPO | Admitting: *Deleted

## 2019-08-31 ENCOUNTER — Telehealth: Payer: Self-pay

## 2019-08-31 ENCOUNTER — Ambulatory Visit: Payer: PPO

## 2019-08-31 DIAGNOSIS — I471 Supraventricular tachycardia: Secondary | ICD-10-CM

## 2019-08-31 LAB — CUP PACEART REMOTE DEVICE CHECK
Date Time Interrogation Session: 20210128000500
Implantable Pulse Generator Implant Date: 20170516

## 2019-08-31 NOTE — Progress Notes (Signed)
ILR Remote 

## 2019-08-31 NOTE — Telephone Encounter (Signed)
DPR on record, left detailed message for pt advising ILR has reached RRT.  Inactivated pt in system and return kit mailed out to pt.  Requested she callback to discuss whether she would like ILR removed.

## 2019-09-01 ENCOUNTER — Encounter: Payer: Self-pay | Admitting: General Practice

## 2019-09-01 NOTE — Telephone Encounter (Signed)
Spoke with pt.  She v/u regarding ILR device.  She will send remote monitor back when return kit arrives.  She will call to schedule appt with Dr. Rayann Heman if she would like device removed.

## 2019-09-08 ENCOUNTER — Ambulatory Visit: Payer: PPO | Attending: Internal Medicine

## 2019-09-08 DIAGNOSIS — Z23 Encounter for immunization: Secondary | ICD-10-CM | POA: Insufficient documentation

## 2019-09-08 NOTE — Progress Notes (Signed)
   Covid-19 Vaccination Clinic  Name:  Stacey Ramirez    MRN: [id number] DOB: [birth year]  09/08/2019  Stacey Ramirez was observed post Covid-19 immunization for 15 minutes without incidence. She was provided with Vaccine Information Sheet and instruction to access the V-Safe system.   Stacey Ramirez was instructed to call 911 with any severe reactions post vaccine: Marland Kitchen Difficulty breathing  . Swelling of your face and throat  . A fast heartbeat  . A bad rash all over your body  . Dizziness and weakness    Immunizations Administered    Name Date Dose VIS Date Route   Pfizer COVID-19 Vaccine 09/08/2019  3:12 PM 0.3 mL 07/14/2019 Intramuscular   Manufacturer: [place]   Lot: [id number]   [place]: [id number]

## 2019-09-11 ENCOUNTER — Ambulatory Visit: Payer: PPO

## 2019-09-12 ENCOUNTER — Encounter: Payer: Self-pay | Admitting: Physical Therapy

## 2019-09-12 ENCOUNTER — Ambulatory Visit: Payer: PPO | Attending: Internal Medicine | Admitting: Physical Therapy

## 2019-09-12 ENCOUNTER — Other Ambulatory Visit: Payer: Self-pay

## 2019-09-12 DIAGNOSIS — R2689 Other abnormalities of gait and mobility: Secondary | ICD-10-CM | POA: Diagnosis not present

## 2019-09-12 DIAGNOSIS — G8194 Hemiplegia, unspecified affecting left nondominant side: Secondary | ICD-10-CM | POA: Diagnosis not present

## 2019-09-12 DIAGNOSIS — G20 Parkinson's disease: Secondary | ICD-10-CM | POA: Diagnosis not present

## 2019-09-12 DIAGNOSIS — R296 Repeated falls: Secondary | ICD-10-CM | POA: Insufficient documentation

## 2019-09-12 DIAGNOSIS — M6281 Muscle weakness (generalized): Secondary | ICD-10-CM | POA: Diagnosis not present

## 2019-09-12 DIAGNOSIS — R262 Difficulty in walking, not elsewhere classified: Secondary | ICD-10-CM | POA: Diagnosis not present

## 2019-09-12 NOTE — Therapy (Signed)
[place], [place], [address] Phone: [phone]   Fax:  [phone]  Physical Therapy Evaluation  Patient Details  Name: Stacey Ramirez MRN: [id number] Date of Birth: 1941/06/05 Referring Provider (PT): Pharr   Encounter Date: 09/12/2019  PT End of Session - 09/12/19 1520    Visit Number  1    Date for PT Re-Evaluation  11/10/19    Authorization Type  [employer]    PT Start Time  [id number]    PT Stop Time  1524    PT Time Calculation (min)  39 min    Activity Tolerance  Patient tolerated treatment well    Behavior During Therapy  [facility] for tasks assessed/performed       Past Medical History:  Diagnosis Date  . Adjustment reaction with anxiety 12/20/2015  . Aortic stenosis   . CAD (coronary artery disease) 06/09/2017   Nuc study 10/18: EF 69, inferolateral perfusion defect-possible small infarct with peri-infarct ischemia; intermediate risk // LHC 10/18: pLAD 25, pLCx 50, pRCA 25 >> med Rx  . Carotid stenosis, asymptomatic, bilateral 02/27/2016   Carotid US 5/17: Bilateral ICA 1-39  . Cerebrovascular accident (stroke) ([place])    a. 12/2015 - cryptogenic.  S/P MDT Linq.  . Difficulty walking   . Essential hypertension   . Gait disorder 04/07/2016  . Heart palpitations 09/06/2012  . History of CVA with residual deficit 12/14/2015  . History of dizziness   . History of echocardiogram    Echo 10/18: Vigorous LVF, EF 65-70, normal wall motion, grade 1 diastolic dysfunction, GLS -21.1%, mild RAE  . History of nuclear stress test    Nuclear stress test 10/18: EF 69, inf-lat defect c/w poss infarct with peri-infarct ischemia; Intermediate Risk  . Hyperlipidemia   . Hypothyroidism   . Left hemiparesis ([place])   . LVH (left ventricular hypertrophy)   . Mitral regurgitation    a. 12/2015 Echo: EF 60-65%, mild focal basal hypertrophy. No rwma, triv AI, mild MR, mildly dil LA w/o thrombus. No RA thrombus. No  PFO.  Marland Kitchen Palpitations   . Prediabetes   . Stroke-like symptom 12/13/2015  . SVT (supraventricular tachycardia) ([place]) 12/14/2015  . Vascular parkinsonism ([place]) 06/29/2016  . Vitamin D deficiency     Past Surgical History:  Procedure Laterality Date  . CARDIOVASCULAR STRESS TEST  2002   NORMAL  . EP IMPLANTABLE DEVICE N/A 12/17/2015   Procedure: Loop Recorder Insertion;  Surgeon: [name], MD;  Location: [place] [facility];  Service: Cardiovascular;  Laterality: N/A;  . LEFT HEART CATH AND CORONARY ANGIOGRAPHY N/A 05/25/2017   Procedure: LEFT HEART CATH AND CORONARY ANGIOGRAPHY;  Surgeon: Sherren Mocha, MD;  Location: [place] [facility];  Service: Cardiovascular;  Laterality: N/A;  . LOOP RECORDER IMPLANT    . TEE WITHOUT CARDIOVERSION N/A 12/17/2015   Procedure: TRANSESOPHAGEAL ECHOCARDIOGRAM (TEE);  Surgeon: Lelon Perla, MD;  Location: [facility] ENDOSCOPY;  Service: Cardiovascular;  Laterality: N/A;  Pt also needs a LOOP  . TRANSTHORACIC ECHOCARDIOGRAM  2008   SHOWED LEFT VENTRICULAR HYPERTROPHY AND MILD AORTIC STENOSIS    There were no vitals filed for this visit.   Subjective Assessment - 09/12/19 1453    Subjective  Patient is very familar to [place], she has been seen off and on over the years after a significant stroke in 2017, she has had multiple issues with walking and multiple falls.  She reports that she reports  that " i just want to give up and not do anything", she c/o weakness in her legs and difficulty staying active    Limitations  Standing;Walking;House hold activities    Patient Stated Goals  be stronger, walk better    Currently in Pain?  No/denies         [facility] PT Assessment - 09/12/19 0001      Assessment   Medical Diagnosis  difficulty walking, weakness    Referring Provider (PT)  Pharr    Onset Date/Surgical Date  08/24/19    Prior Therapy  last year for the same      Balance Screen   Has the patient fallen in the past 6 months  No    Has the patient had  a decrease in activity level because of a fear of falling?   Yes    Is the patient reluctant to leave their home because of a fear of falling?   Yes      Home Environment   Additional Comments  uses 4WW around house, about 16 steps in the house, lives with husband, he cares for her and does most of the ADL's.      Prior Function   Level of Independence  Independent with household mobility with device;Needs assistance with homemaking;Needs assistance with ADLs    Vocation  Retired    Leisure  no activities, her and her husband would go to [employer] very often      Posture/Postural Control   Posture Comments  fwd head, rounded shoulders      ROM / Strength   AROM / PROM / Strength  Strength      Strength   Strength Assessment Site  Hip;Knee;Shoulder    Right/Left Shoulder  Right;Left    Right Shoulder Flexion  3+/5    Right Shoulder ABduction  3+/5    Right Shoulder External Rotation  3+/5    Left Shoulder Flexion  3+/5    Left Shoulder ABduction  3+/5    Left Shoulder External Rotation  3+/5    Right/Left Hip  Right;Left    Right Hip Flexion  3+/5    Right Hip Extension  3+/5    Right Hip ABduction  3+/5    Left Hip Flexion  3+/5    Left Hip Extension  3+/5    Left Hip ABduction  3+/5    Right/Left Knee  Right;Left    Right Knee Flexion  4-/5    Right Knee Extension  4-/5    Left Knee Flexion  4-/5    Left Knee Extension  4-/5      Ambulation/Gait   Gait Comments  uses a FWW, very slow, shuffling gait, difficulty with turns at times, and difficulty with standing up at times      Standardized Balance Assessment   Standardized Balance Assessment  Timed Up and Go Test;Five Times Sit to Stand    Five times sit to stand comments   40 seconds with both arms pushing up from chair      Timed Up and Go Test   Normal TUG (seconds)  62    TUG Comments  this is a big decrease from when we last saw her, she was under 30 seconds in October                Objective  measurements completed on examination: See above findings.      [place] Adult PT Treatment/Exercise - 09/12/19 0001  Exercises   Exercises  Knee/Hip      Knee/Hip Exercises: Aerobic   Nustep  level 4 x 6 minutes               PT Short Term Goals - 09/12/19 1530      PT SHORT TERM GOAL #1   Title  independent with her home HEP    Time  2    Period  Weeks    Status  New        PT Long Term Goals - 09/12/19 1530      PT LONG TERM GOAL #1   Title  TUG with RW 28 seconds    Baseline  60 seconds    Time  8    Period  Weeks    Status  New      PT LONG TERM GOAL #2   Title  increase Berg balance score to 40/56 for safety    Baseline  will need to test    Time  8    Period  Weeks    Status  New      PT LONG TERM GOAL #3   Title  increase LE strength (particularly ABD and ext.) to 4/5 for functional gait and safety    Baseline  3+/5    Time  8    Period  Weeks    Status  New      PT LONG TERM GOAL #4   Title  be able to do a sit to stand without assistance and without losing balance for functional use    Time  8    Period  Weeks    Status  New             Plan - 09/12/19 1521    Clinical Impression Statement  Patient is familiar to use as she had a significant CVA in 2017 and has had issues with walking and strength previously.  She usually gets better with our intervention, she has difficulty with her and her husband trying to do things at home to keep her active.  Her Tug today was 60 seconds, it was down to 28 seconds in October.  I did a timed sit to stand 5 times today, she fatigued easily and took her over 42 seconds.  Her strength is much less that last year, she continues to use a FWW, slow gait and at times really festenates and shuffles, usually with turns and transfers.    Personal Factors and Comorbidities  Comorbidity 3+    Comorbidities  anxiety, CAD, CVA    Examination-Activity Limitations  Bathing;Stairs;Lift;Locomotion  Level;Stand;Carry;Transfers    Examination-Participation Restrictions  Laundry;Shop;Cleaning    Stability/Clinical Decision Making  Evolving/Moderate complexity    Clinical Decision Making  Moderate    Rehab Potential  Fair    PT Frequency  2x / week    PT Duration  8 weeks    PT Treatment/Interventions  ADLs/Self Care Home Management;Functional mobility training;Stair training;Gait training;Therapeutic activities;Therapeutic exercise;Balance training;Neuromuscular re-education;Manual techniques;Patient/family education    PT Next Visit Plan  start strength and functional mobility    Consulted and Agree with Plan of Care  Patient       Patient will benefit from skilled therapeutic intervention in order to improve the following deficits and impairments:  Abnormal gait, Postural dysfunction, Decreased mobility, Cardiopulmonary status limiting activity, Decreased activity tolerance, Decreased endurance, Decreased strength, Difficulty walking, Decreased balance  Visit Diagnosis: Difficulty in walking, not elsewhere classified - Plan: PT  plan of care cert/re-cert  Muscle weakness (generalized) - Plan: PT plan of care cert/re-cert  Balance problem - Plan: PT plan of care cert/re-cert  Repeated falls - Plan: PT plan of care cert/re-cert  Left hemiparesis ([place]) - Plan: PT plan of care cert/re-cert  Parkinson disease ([place]) - Plan: PT plan of care cert/re-cert     Problem List Patient Active Problem List   Diagnosis Date Noted  . Familial hypercholesteremia 02/21/2018  . Psychogenic gait 10/21/2017  . Coronary artery disease involving native coronary artery of native heart without angina pectoris 06/09/2017  . Abnormal nuclear cardiac imaging test 05/25/2017  . PVC's (premature ventricular contractions) 05/17/2017  . Anxiety 12/29/2016  . Vascular parkinsonism ([place]) 06/29/2016  . Gait disorder 04/07/2016  . Intracranial carotid stenosis, bilateral 02/27/2016  . Essential hypertension    . Left-sided weakness 12/28/2015  . Headache 12/28/2015  . Adjustment reaction with anxiety 12/20/2015  . Left hemiparesis ([place])   . History of CVA with residual deficit   . Prediabetes   . SVT (supraventricular tachycardia) ([place]) 12/14/2015  . Stroke-like symptom 12/13/2015  . History of stroke   . Heart palpitations 09/06/2012  . Hypertension   . Familial hyperlipidemia   . Hypothyroidism   . Vitamin D deficiency   . Palpitations   . History of dizziness   . LVH (left ventricular hypertrophy)   . Aortic stenosis   . Mitral regurgitation   . SOB (shortness of breath)   . Difficulty walking     Sumner Boast., PT 09/12/2019, 4:09 PM  [place] [address] [place], [place], [address] Phone: [phone]   Fax:  [phone]  Name: Stacey Ramirez MRN: [id number] Date of Birth: 06-Dec-1940

## 2019-09-14 ENCOUNTER — Other Ambulatory Visit: Payer: Self-pay

## 2019-09-14 ENCOUNTER — Ambulatory Visit: Payer: PPO | Admitting: Physical Therapy

## 2019-09-14 ENCOUNTER — Encounter: Payer: Self-pay | Admitting: Physical Therapy

## 2019-09-14 DIAGNOSIS — R2689 Other abnormalities of gait and mobility: Secondary | ICD-10-CM

## 2019-09-14 DIAGNOSIS — R262 Difficulty in walking, not elsewhere classified: Secondary | ICD-10-CM

## 2019-09-14 DIAGNOSIS — M6281 Muscle weakness (generalized): Secondary | ICD-10-CM

## 2019-09-14 NOTE — Therapy (Signed)
[place], [place], [address] Phone: [phone]   Fax:  [phone]  Physical Therapy Treatment  Patient Details  Name: EVALYN EIGSTI MRN: [id number] Date of Birth: Jun 05, 1941 Referring Provider (PT): Pharr   Encounter Date: 09/14/2019  PT End of Session - 09/14/19 1428    Visit Number  2    Date for PT Re-Evaluation  11/10/19    Authorization Type  [employer]    PT Start Time  [id number]    PT Stop Time  1428    PT Time Calculation (min)  43 min    Activity Tolerance  Patient tolerated treatment well    Behavior During Therapy  [facility] for tasks assessed/performed       Past Medical History:  Diagnosis Date  . Adjustment reaction with anxiety 12/20/2015  . Aortic stenosis   . CAD (coronary artery disease) 06/09/2017   Nuc study 10/18: EF 69, inferolateral perfusion defect-possible small infarct with peri-infarct ischemia; intermediate risk // LHC 10/18: pLAD 25, pLCx 50, pRCA 25 >> med Rx  . Carotid stenosis, asymptomatic, bilateral 02/27/2016   Carotid US 5/17: Bilateral ICA 1-39  . Cerebrovascular accident (stroke) ([place])    a. 12/2015 - cryptogenic.  S/P MDT Linq.  . Difficulty walking   . Essential hypertension   . Gait disorder 04/07/2016  . Heart palpitations 09/06/2012  . History of CVA with residual deficit 12/14/2015  . History of dizziness   . History of echocardiogram    Echo 10/18: Vigorous LVF, EF 65-70, normal wall motion, grade 1 diastolic dysfunction, GLS -21.1%, mild RAE  . History of nuclear stress test    Nuclear stress test 10/18: EF 69, inf-lat defect c/w poss infarct with peri-infarct ischemia; Intermediate Risk  . Hyperlipidemia   . Hypothyroidism   . Left hemiparesis ([place])   . LVH (left ventricular hypertrophy)   . Mitral regurgitation    a. 12/2015 Echo: EF 60-65%, mild focal basal hypertrophy. No rwma, triv AI, mild MR, mildly dil LA w/o thrombus. No RA thrombus. No  PFO.  Marland Kitchen Palpitations   . Prediabetes   . Stroke-like symptom 12/13/2015  . SVT (supraventricular tachycardia) ([place]) 12/14/2015  . Vascular parkinsonism ([place]) 06/29/2016  . Vitamin D deficiency     Past Surgical History:  Procedure Laterality Date  . CARDIOVASCULAR STRESS TEST  2002   NORMAL  . EP IMPLANTABLE DEVICE N/A 12/17/2015   Procedure: Loop Recorder Insertion;  Surgeon: [name], MD;  Location: [place] [facility];  Service: Cardiovascular;  Laterality: N/A;  . LEFT HEART CATH AND CORONARY ANGIOGRAPHY N/A 05/25/2017   Procedure: LEFT HEART CATH AND CORONARY ANGIOGRAPHY;  Surgeon: Sherren Mocha, MD;  Location: [place] [facility];  Service: Cardiovascular;  Laterality: N/A;  . LOOP RECORDER IMPLANT    . TEE WITHOUT CARDIOVERSION N/A 12/17/2015   Procedure: TRANSESOPHAGEAL ECHOCARDIOGRAM (TEE);  Surgeon: Lelon Perla, MD;  Location: [facility] ENDOSCOPY;  Service: Cardiovascular;  Laterality: N/A;  Pt also needs a LOOP  . TRANSTHORACIC ECHOCARDIOGRAM  2008   SHOWED LEFT VENTRICULAR HYPERTROPHY AND MILD AORTIC STENOSIS    There were no vitals filed for this visit.  Subjective Assessment - 09/14/19 1345    Subjective  "I wish I could have a nap"    Currently in Pain?  No/denies                       [facility] Adult  PT Treatment/Exercise - 09/14/19 0001      High Level Balance   High Level Balance Activities  Side stepping    High Level Balance Comments  HHA x 2, then contact guard assist      Knee/Hip Exercises: Standing   Other Standing Knee Exercises  Marchinf assit at hips 2x10       Knee/Hip Exercises: Seated   Long Arc Quad  Both;2 sets;10 reps    Long Arc Quad Weight  3 lbs.    Other Seated Knee/Hip Exercises  seated OHP 3lb bilat UE 2x10    Marching  Both;2 sets;10 reps    Marching Weights  3 lbs.    Hamstring Curl  Strengthening;Both;2 sets;15 reps    Hamstring Limitations  red tband    Sit to Sand  2 sets;10 reps;5 reps   blue chair airex for first  10 with UE assist. 10 without air              PT Short Term Goals - 09/12/19 1530      PT SHORT TERM GOAL #1   Title  independent with her home HEP    Time  2    Period  Weeks    Status  New        PT Long Term Goals - 09/12/19 1530      PT LONG TERM GOAL #1   Title  TUG with RW 28 seconds    Baseline  60 seconds    Time  8    Period  Weeks    Status  New      PT LONG TERM GOAL #2   Title  increase Berg balance score to 40/56 for safety    Baseline  will need to test    Time  8    Period  Weeks    Status  New      PT LONG TERM GOAL #3   Title  increase LE strength (particularly ABD and ext.) to 4/5 for functional gait and safety    Baseline  3+/5    Time  8    Period  Weeks    Status  New      PT LONG TERM GOAL #4   Title  be able to do a sit to stand without assistance and without losing balance for functional use    Time  8    Period  Weeks    Status  New            Plan - 09/14/19 1428    Clinical Impression Statement  Pt is verbally negative throughout session about her abilities. Cues needed throughout session to sit up in chair and engage core. LE burning / pulling with LAQ. Some assist needed times with sit to stands. No reports of pain. She tends to turn her hips with side steps. Cues to increase step length with ambulation    Personal Factors and Co morbidities  Comorbidity 3+    Comorbidities  anxiety, CAD, CVA    Examination-Activity Limitations  Bathing;Stairs;Lift;Locomotion Level;Stand;Carry;Transfers    Examination-Participation Restrictions  Laundry;Shop;Cleaning    Stability/Clinical Decision Making  Evolving/Moderate complexity    Rehab Potential  Fair    PT Frequency  2x / week    PT Duration  8 weeks    PT Treatment/Interventions  ADLs/Self Care Home Management;Functional mobility training;Stair training;Gait training;Therapeutic activities;Therapeutic exercise;Balance training;Neuromuscular re-education;Manual  techniques;Patient/family education       Patient will benefit from skilled therapeutic  intervention in order to improve the following deficits and impairments:  Abnormal gait, Postural dysfunction, Decreased mobility, Cardiopulmonary status limiting activity, Decreased activity tolerance, Decreased endurance, Decreased strength, Difficulty walking, Decreased balance  Visit Diagnosis: Muscle weakness (generalized)  Balance problem  Difficulty in walking, not elsewhere classified     Problem List Patient Active Problem List   Diagnosis Date Noted  . Familial hypercholesteremia 02/21/2018  . Psychogenic gait 10/21/2017  . Coronary artery disease involving native coronary artery of native heart without angina pectoris 06/09/2017  . Abnormal nuclear cardiac imaging test 05/25/2017  . PVC's (premature ventricular contractions) 05/17/2017  . Anxiety 12/29/2016  . Vascular parkinsonism ([place]) 06/29/2016  . Gait disorder 04/07/2016  . Intracranial carotid stenosis, bilateral 02/27/2016  . Essential hypertension   . Left-sided weakness 12/28/2015  . Headache 12/28/2015  . Adjustment reaction with anxiety 12/20/2015  . Left hemiparesis ([place])   . History of CVA with residual deficit   . Prediabetes   . SVT (supraventricular tachycardia) ([place]) 12/14/2015  . Stroke-like symptom 12/13/2015  . History of stroke   . Heart palpitations 09/06/2012  . Hypertension   . Familial hyperlipidemia   . Hypothyroidism   . Vitamin D deficiency   . Palpitations   . History of dizziness   . LVH (left ventricular hypertrophy)   . Aortic stenosis   . Mitral regurgitation   . SOB (shortness of breath)   . Difficulty walking     Scot Jun, PTA 09/14/2019, 2:31 PM  [place], [place], [address] Phone: [phone]   Fax:  [phone]  Name: GLORYA CANDELL MRN: [id number] Date of Birth: 12-25-1940

## 2019-09-19 ENCOUNTER — Encounter: Payer: Self-pay | Admitting: Physical Therapy

## 2019-09-19 ENCOUNTER — Ambulatory Visit: Payer: PPO | Admitting: Physical Therapy

## 2019-09-19 ENCOUNTER — Other Ambulatory Visit: Payer: Self-pay

## 2019-09-19 DIAGNOSIS — R2689 Other abnormalities of gait and mobility: Secondary | ICD-10-CM

## 2019-09-19 DIAGNOSIS — M6281 Muscle weakness (generalized): Secondary | ICD-10-CM

## 2019-09-19 DIAGNOSIS — G8194 Hemiplegia, unspecified affecting left nondominant side: Secondary | ICD-10-CM

## 2019-09-19 DIAGNOSIS — G20 Parkinson's disease: Secondary | ICD-10-CM

## 2019-09-19 DIAGNOSIS — R296 Repeated falls: Secondary | ICD-10-CM

## 2019-09-19 DIAGNOSIS — R262 Difficulty in walking, not elsewhere classified: Secondary | ICD-10-CM

## 2019-09-19 NOTE — Therapy (Signed)
[place], [place], [address] Phone: [phone]   Fax:  [phone]  Physical Therapy Treatment  Patient Details  Name: Stacey Ramirez MRN: [id number] Date of Birth: [birth year] Referring Provider (PT): Pharr   Encounter Date: 09/19/2019  PT End of Session - 09/19/19 1527    Visit Number  3    Date for PT Re-Evaluation  11/10/19    Authorization Type  [employer]    PT Start Time  [id number]    PT Stop Time  1528    PT Time Calculation (min)  44 min    Activity Tolerance  Patient tolerated treatment well    Behavior During Therapy  [facility] for tasks assessed/performed       Past Medical History:  Diagnosis Date  . Adjustment reaction with anxiety 12/20/2015  . Aortic stenosis   . CAD (coronary artery disease) 06/09/2017   Nuc study 10/18: EF 69, inferolateral perfusion defect-possible small infarct with peri-infarct ischemia; intermediate risk // LHC 10/18: pLAD 25, pLCx 50, pRCA 25 >> med Rx  . Carotid stenosis, asymptomatic, bilateral 02/27/2016   Carotid US 5/17: Bilateral ICA 1-39  . Cerebrovascular accident (stroke) ([place])    a. 12/2015 - cryptogenic.  S/P MDT Linq.  . Difficulty walking   . Essential hypertension   . Gait disorder 04/07/2016  . Heart palpitations 09/06/2012  . History of CVA with residual deficit 12/14/2015  . History of dizziness   . History of echocardiogram    Echo 10/18: Vigorous LVF, EF 65-70, normal wall motion, grade 1 diastolic dysfunction, GLS -21.1%, mild RAE  . History of nuclear stress test    Nuclear stress test 10/18: EF 69, inf-lat defect c/w poss infarct with peri-infarct ischemia; Intermediate Risk  . Hyperlipidemia   . Hypothyroidism   . Left hemiparesis ([place])   . LVH (left ventricular hypertrophy)   . Mitral regurgitation    a. 12/2015 Echo: EF 60-65%, mild focal basal hypertrophy. No rwma, triv AI, mild MR, mildly dil LA w/o thrombus. No RA thrombus. No  PFO.  Marland Kitchen Palpitations   . Prediabetes   . Stroke-like symptom 12/13/2015  . SVT (supraventricular tachycardia) ([place]) 12/14/2015  . Vascular parkinsonism ([place]) 06/29/2016  . Vitamin D deficiency     Past Surgical History:  Procedure Laterality Date  . CARDIOVASCULAR STRESS TEST  2002   NORMAL  . EP IMPLANTABLE DEVICE N/A 12/17/2015   Procedure: Loop Recorder Insertion;  Surgeon: [name], MD;  Location: [place] [facility];  Service: Cardiovascular;  Laterality: N/A;  . LEFT HEART CATH AND CORONARY ANGIOGRAPHY N/A 05/25/2017   Procedure: LEFT HEART CATH AND CORONARY ANGIOGRAPHY;  Surgeon: Sherren Mocha, MD;  Location: [place] [facility];  Service: Cardiovascular;  Laterality: N/A;  . LOOP RECORDER IMPLANT    . TEE WITHOUT CARDIOVERSION N/A 12/17/2015   Procedure: TRANSESOPHAGEAL ECHOCARDIOGRAM (TEE);  Surgeon: Lelon Perla, MD;  Location: [facility] ENDOSCOPY;  Service: Cardiovascular;  Laterality: N/A;  Pt also needs a LOOP  . TRANSTHORACIC ECHOCARDIOGRAM  2008   SHOWED LEFT VENTRICULAR HYPERTROPHY AND MILD AORTIC STENOSIS    There were no vitals filed for this visit.  Subjective Assessment - 09/19/19 1448    Subjective  No falls, I just am slow today    Currently in Pain?  No/denies                       [facility] Adult  PT Treatment/Exercise - 09/19/19 0001      Ambulation/Gait   Gait Comments  walked with her in the hall with FWW, cues for step length and for speed, she tends to stop when she talks and she does talk a lot      High Level Balance   High Level Balance Activities  Side stepping;Backward walking    High Level Balance Comments  standing ball toss, standing shadow boxing      Knee/Hip Exercises: Aerobic   Recumbent Bike  5 minutes    Nustep  level 4 x 6 minutes               PT Short Term Goals - 09/12/19 1530      PT SHORT TERM GOAL #1   Title  independent with her home HEP    Time  2    Period  Weeks    Status  New        PT Long  Term Goals - 09/12/19 1530      PT LONG TERM GOAL #1   Title  TUG with RW 28 seconds    Baseline  60 seconds    Time  8    Period  Weeks    Status  New      PT LONG TERM GOAL #2   Title  increase Berg balance score to 40/56 for safety    Baseline  will need to test    Time  8    Period  Weeks    Status  New      PT LONG TERM GOAL #3   Title  increase LE strength (particularly ABD and ext.) to 4/5 for functional gait and safety    Baseline  3+/5    Time  8    Period  Weeks    Status  New      PT LONG TERM GOAL #4   Title  be able to do a sit to stand without assistance and without losing balance for functional use    Time  8    Period  Weeks    Status  New            Plan - 09/19/19 1528    Clinical Impression Statement  I focused a little more on reciprocal actities to help with her movement patterns, the boxing was her puching from right to left and left to right to incorporate some trunk rotation    PT Next Visit Plan  push balance and mobility    Consulted and Agree with Plan of Care  Patient       Patient will benefit from skilled therapeutic intervention in order to improve the following deficits and impairments:  Abnormal gait, Postural dysfunction, Decreased mobility, Cardiopulmonary status limiting activity, Decreased activity tolerance, Decreased endurance, Decreased strength, Difficulty walking, Decreased balance  Visit Diagnosis: Muscle weakness (generalized)  Balance problem  Difficulty in walking, not elsewhere classified  Repeated falls  Left hemiparesis (HCC)  Parkinson disease ([place])     Problem List Patient Active Problem List   Diagnosis Date Noted  . Familial hypercholesteremia 02/21/2018  . Psychogenic gait 10/21/2017  . Coronary artery disease involving native coronary artery of native heart without angina pectoris 06/09/2017  . Abnormal nuclear cardiac imaging test 05/25/2017  . PVC's (premature ventricular contractions)  05/17/2017  . Anxiety 12/29/2016  . Vascular parkinsonism ([place]) 06/29/2016  . Gait disorder 04/07/2016  . Intracranial carotid stenosis, bilateral 02/27/2016  . Essential hypertension   .  Left-sided weakness 12/28/2015  . Headache 12/28/2015  . Adjustment reaction with anxiety 12/20/2015  . Left hemiparesis ([place])   . History of CVA with residual deficit   . Prediabetes   . SVT (supraventricular tachycardia) ([place]) 12/14/2015  . Stroke-like symptom 12/13/2015  . History of stroke   . Heart palpitations 09/06/2012  . Hypertension   . Familial hyperlipidemia   . Hypothyroidism   . Vitamin D deficiency   . Palpitations   . History of dizziness   . LVH (left ventricular hypertrophy)   . Aortic stenosis   . Mitral regurgitation   . SOB (shortness of breath)   . Difficulty walking     Sumner Boast., PT 09/19/2019, 3:30 PM  [place] [address] [place], [place], [address] Phone: [phone]   Fax:  [phone]  Name: Stacey Ramirez MRN: [id number] Date of Birth: 1940-09-14

## 2019-09-22 ENCOUNTER — Ambulatory Visit: Payer: PPO | Admitting: Physical Therapy

## 2019-09-22 ENCOUNTER — Other Ambulatory Visit: Payer: Self-pay

## 2019-09-22 ENCOUNTER — Encounter: Payer: Self-pay | Admitting: Physical Therapy

## 2019-09-22 DIAGNOSIS — R296 Repeated falls: Secondary | ICD-10-CM

## 2019-09-22 DIAGNOSIS — R2689 Other abnormalities of gait and mobility: Secondary | ICD-10-CM

## 2019-09-22 DIAGNOSIS — R262 Difficulty in walking, not elsewhere classified: Secondary | ICD-10-CM | POA: Diagnosis not present

## 2019-09-22 DIAGNOSIS — M6281 Muscle weakness (generalized): Secondary | ICD-10-CM

## 2019-09-22 NOTE — Therapy (Signed)
[place], [place], [address] Phone: [phone]   Fax:  [phone]  Physical Therapy Treatment  Patient Details  Name: Stacey Ramirez MRN: [id number] Date of Birth: 1940-08-13 Referring Provider (PT): Pharr   Encounter Date: 09/22/2019  PT End of Session - 09/22/19 1151    Visit Number  4    Date for PT Re-Evaluation  11/10/19    Authorization Type  [employer]    PT Start Time  1102    PT Stop Time  1145    PT Time Calculation (min)  43 min    Activity Tolerance  Patient tolerated treatment well    Behavior During Therapy  [facility] for tasks assessed/performed       Past Medical History:  Diagnosis Date  . Adjustment reaction with anxiety 12/20/2015  . Aortic stenosis   . CAD (coronary artery disease) 06/09/2017   Nuc study 10/18: EF 69, inferolateral perfusion defect-possible small infarct with peri-infarct ischemia; intermediate risk // LHC 10/18: pLAD 25, pLCx 50, pRCA 25 >> med Rx  . Carotid stenosis, asymptomatic, bilateral 02/27/2016   Carotid US 5/17: Bilateral ICA 1-39  . Cerebrovascular accident (stroke) ([place])    a. 12/2015 - cryptogenic.  S/P MDT Linq.  . Difficulty walking   . Essential hypertension   . Gait disorder 04/07/2016  . Heart palpitations 09/06/2012  . History of CVA with residual deficit 12/14/2015  . History of dizziness   . History of echocardiogram    Echo 10/18: Vigorous LVF, EF 65-70, normal wall motion, grade 1 diastolic dysfunction, GLS -21.1%, mild RAE  . History of nuclear stress test    Nuclear stress test 10/18: EF 69, inf-lat defect c/w poss infarct with peri-infarct ischemia; Intermediate Risk  . Hyperlipidemia   . Hypothyroidism   . Left hemiparesis ([place])   . LVH (left ventricular hypertrophy)   . Mitral regurgitation    a. 12/2015 Echo: EF 60-65%, mild focal basal hypertrophy. No rwma, triv AI, mild MR, mildly dil LA w/o thrombus. No RA thrombus. No  PFO.  Marland Kitchen Palpitations   . Prediabetes   . Stroke-like symptom 12/13/2015  . SVT (supraventricular tachycardia) ([place]) 12/14/2015  . Vascular parkinsonism ([place]) 06/29/2016  . Vitamin D deficiency     Past Surgical History:  Procedure Laterality Date  . CARDIOVASCULAR STRESS TEST  2002   NORMAL  . EP IMPLANTABLE DEVICE N/A 12/17/2015   Procedure: Loop Recorder Insertion;  Surgeon: [name], MD;  Location: [place] [facility];  Service: Cardiovascular;  Laterality: N/A;  . LEFT HEART CATH AND CORONARY ANGIOGRAPHY N/A 05/25/2017   Procedure: LEFT HEART CATH AND CORONARY ANGIOGRAPHY;  Surgeon: Sherren Mocha, MD;  Location: [place] [facility];  Service: Cardiovascular;  Laterality: N/A;  . LOOP RECORDER IMPLANT    . TEE WITHOUT CARDIOVERSION N/A 12/17/2015   Procedure: TRANSESOPHAGEAL ECHOCARDIOGRAM (TEE);  Surgeon: Lelon Perla, MD;  Location: [facility] ENDOSCOPY;  Service: Cardiovascular;  Laterality: N/A;  Pt also needs a LOOP  . TRANSTHORACIC ECHOCARDIOGRAM  2008   SHOWED LEFT VENTRICULAR HYPERTROPHY AND MILD AORTIC STENOSIS    There were no vitals filed for this visit.  Subjective Assessment - 09/22/19 1102    Subjective  "Im feeling tired a little bit" No falls    Currently in Pain?  No/denies                       [facility]  Adult PT Treatment/Exercise - 09/22/19 0001      High Level Balance   High Level Balance Activities  Side stepping;Backward walking    High Level Balance Comments  Standing dynamic balance reaching cross body outside base of support       Knee/Hip Exercises: Aerobic   Tread Mill  1 MPH x 3 min    Other Aerobic  UBE L2 2x min       Knee/Hip Exercises: Machines for Strengthening   Cybex Knee Extension  10lb 2x10, SL 5lb x 10 each    Cybex Knee Flexion  25lb 2x10, SL 10lb x10 each       Knee/Hip Exercises: Standing   Other Standing Knee Exercises  Standing march no assit 2x10                PT Short Term Goals - 09/12/19 1530       PT SHORT TERM GOAL #1   Title  independent with her home HEP    Time  2    Period  Weeks    Status  New        PT Long Term Goals - 09/12/19 1530      PT LONG TERM GOAL #1   Title  TUG with RW 28 seconds    Baseline  60 seconds    Time  8    Period  Weeks    Status  New      PT LONG TERM GOAL #2   Title  increase Berg balance score to 40/56 for safety    Baseline  will need to test    Time  8    Period  Weeks    Status  New      PT LONG TERM GOAL #3   Title  increase LE strength (particularly ABD and ext.) to 4/5 for functional gait and safety    Baseline  3+/5    Time  8    Period  Weeks    Status  New      PT LONG TERM GOAL #4   Title  be able to do a sit to stand without assistance and without losing balance for functional use    Time  8    Period  Weeks    Status  New            Plan - 09/22/19 1152    Clinical Impression Statement  Pt was able to complete all of today's interventions. She tends to turn hip in the direction she is going with side steps, cues needed to keep hips square. Frequent LOB posteriority with backwards walking, she has difficulty increasing step length with LLE often times leaving it out front causing her to loose balance. Some LOB with cross body reaching. Min assist to correct all LOB. Does good with machine level strengthening.    Comorbidities  anxiety, CAD, CVA    Examination-Activity Limitations  Bathing;Stairs;Lift;Locomotion Level;Stand;Carry;Transfers    Examination-Participation Restrictions  Laundry;Shop;Cleaning    Stability/Clinical Decision Making  Evolving/Moderate complexity    Rehab Potential  Fair    PT Frequency  2x / week    PT Duration  8 weeks    PT Treatment/Interventions  ADLs/Self Care Home Management;Functional mobility training;Stair training;Gait training;Therapeutic activities;Therapeutic exercise;Balance training;Neuromuscular re-education;Manual techniques;Patient/family education    PT Next Visit Plan   push balance and mobility       Patient will benefit from skilled therapeutic intervention in order to improve the following deficits  and impairments:  Abnormal gait, Postural dysfunction, Decreased mobility, Cardiopulmonary status limiting activity, Decreased activity tolerance, Decreased endurance, Decreased strength, Difficulty walking, Decreased balance  Visit Diagnosis: Muscle weakness (generalized)  Balance problem  Difficulty in walking, not elsewhere classified  Repeated falls     Problem List Patient Active Problem List   Diagnosis Date Noted  . Familial hypercholesteremia 02/21/2018  . Psychogenic gait 10/21/2017  . Coronary artery disease involving native coronary artery of native heart without angina pectoris 06/09/2017  . Abnormal nuclear cardiac imaging test 05/25/2017  . PVC's (premature ventricular contractions) 05/17/2017  . Anxiety 12/29/2016  . Vascular parkinsonism ([place]) 06/29/2016  . Gait disorder 04/07/2016  . Intracranial carotid stenosis, bilateral 02/27/2016  . Essential hypertension   . Left-sided weakness 12/28/2015  . Headache 12/28/2015  . Adjustment reaction with anxiety 12/20/2015  . Left hemiparesis ([place])   . History of CVA with residual deficit   . Prediabetes   . SVT (supraventricular tachycardia) ([place]) 12/14/2015  . Stroke-like symptom 12/13/2015  . History of stroke   . Heart palpitations 09/06/2012  . Hypertension   . Familial hyperlipidemia   . Hypothyroidism   . Vitamin D deficiency   . Palpitations   . History of dizziness   . LVH (left ventricular hypertrophy)   . Aortic stenosis   . Mitral regurgitation   . SOB (shortness of breath)   . Difficulty walking     Scot Jun, PTA 09/22/2019, 11:56 AM  [place], [place], [address] Phone: [phone]   Fax:  [phone]  Name: Stacey Ramirez MRN: [id number] Date of Birth:  Apr 02, 1941

## 2019-09-26 ENCOUNTER — Other Ambulatory Visit: Payer: Self-pay

## 2019-09-26 ENCOUNTER — Ambulatory Visit: Payer: PPO | Admitting: Physical Therapy

## 2019-09-26 DIAGNOSIS — R2689 Other abnormalities of gait and mobility: Secondary | ICD-10-CM

## 2019-09-26 DIAGNOSIS — M6281 Muscle weakness (generalized): Secondary | ICD-10-CM

## 2019-09-26 DIAGNOSIS — R262 Difficulty in walking, not elsewhere classified: Secondary | ICD-10-CM | POA: Diagnosis not present

## 2019-09-26 DIAGNOSIS — R296 Repeated falls: Secondary | ICD-10-CM

## 2019-09-26 NOTE — Therapy (Signed)
[place], [place], [address] Phone: [phone]   Fax:  [phone]  Physical Therapy Treatment  Patient Details  Name: Stacey Ramirez MRN: [id number] Date of Birth: 1941/02/01 Referring Provider (PT): Pharr   Encounter Date: 09/26/2019  PT End of Session - 09/26/19 1514    Visit Number  5    Date for PT Re-Evaluation  11/10/19    PT Start Time  1400    PT Stop Time  0712    PT Time Calculation (min)  45 min       Past Medical History:  Diagnosis Date  . Adjustment reaction with anxiety 12/20/2015  . Aortic stenosis   . CAD (coronary artery disease) 06/09/2017   Nuc study 10/18: EF 69, inferolateral perfusion defect-possible small infarct with peri-infarct ischemia; intermediate risk // LHC 10/18: pLAD 25, pLCx 50, pRCA 25 >> med Rx  . Carotid stenosis, asymptomatic, bilateral 02/27/2016   Carotid US 5/17: Bilateral ICA 1-39  . Cerebrovascular accident (stroke) ([place])    a. 12/2015 - cryptogenic.  S/P MDT Linq.  . Difficulty walking   . Essential hypertension   . Gait disorder 04/07/2016  . Heart palpitations 09/06/2012  . History of CVA with residual deficit 12/14/2015  . History of dizziness   . History of echocardiogram    Echo 10/18: Vigorous LVF, EF 65-70, normal wall motion, grade 1 diastolic dysfunction, GLS -21.1%, mild RAE  . History of nuclear stress test    Nuclear stress test 10/18: EF 69, inf-lat defect c/w poss infarct with peri-infarct ischemia; Intermediate Risk  . Hyperlipidemia   . Hypothyroidism   . Left hemiparesis ([place])   . LVH (left ventricular hypertrophy)   . Mitral regurgitation    a. 12/2015 Echo: EF 60-65%, mild focal basal hypertrophy. No rwma, triv AI, mild MR, mildly dil LA w/o thrombus. No RA thrombus. No PFO.  Marland Kitchen Palpitations   . Prediabetes   . Stroke-like symptom 12/13/2015  . SVT (supraventricular tachycardia) ([place]) 12/14/2015  . Vascular parkinsonism ([place])  06/29/2016  . Vitamin D deficiency     Past Surgical History:  Procedure Laterality Date  . CARDIOVASCULAR STRESS TEST  2002   NORMAL  . EP IMPLANTABLE DEVICE N/A 12/17/2015   Procedure: Loop Recorder Insertion;  Surgeon: [name], MD;  Location: [place] [facility];  Service: Cardiovascular;  Laterality: N/A;  . LEFT HEART CATH AND CORONARY ANGIOGRAPHY N/A 05/25/2017   Procedure: LEFT HEART CATH AND CORONARY ANGIOGRAPHY;  Surgeon: Sherren Mocha, MD;  Location: [place] [facility];  Service: Cardiovascular;  Laterality: N/A;  . LOOP RECORDER IMPLANT    . TEE WITHOUT CARDIOVERSION N/A 12/17/2015   Procedure: TRANSESOPHAGEAL ECHOCARDIOGRAM (TEE);  Surgeon: Lelon Perla, MD;  Location: [facility] ENDOSCOPY;  Service: Cardiovascular;  Laterality: N/A;  Pt also needs a LOOP  . TRANSTHORACIC ECHOCARDIOGRAM  2008   SHOWED LEFT VENTRICULAR HYPERTROPHY AND MILD AORTIC STENOSIS    There were no vitals filed for this visit.  Subjective Assessment - 09/26/19 1401    Subjective  " slow and tired, just tired all the time"    Currently in Pain?  No/denies                       [facility] Adult PT Treatment/Exercise - 09/26/19 0001      Ambulation/Gait   Gait Comments  walked withot AD working on balance, turns and righting reaction CG A  with 1 LOB requiring mod A to right      High Level Balance   High Level Balance Activities  Side stepping;Backward walking;Marching forwards   HHA   High Level Balance Comments  4 inch alt step tap HHA min A alt 20 times 2 sets- trouble picking up left LE   worked on wt shift and righting reaction on and off airex     Knee/Hip Exercises: Aerobic   Nustep  level 4 x 6 minutes      Knee/Hip Exercises: Machines for Strengthening   Cybex Knee Extension  10lb 2x10, SL 5lb x 10 each    Cybex Knee Flexion  25lb 2x10, SL 10lb x10 each       Knee/Hip Exercises: Seated   Sit to Sand  3 sets;5 reps;without UE support   on mat sitting on airex CGA- min A               PT Short Term Goals - 09/26/19 1514      PT SHORT TERM GOAL #1   Title  independent with her home HEP    Status  Achieved        PT Long Term Goals - 09/12/19 1530      PT LONG TERM GOAL #1   Title  TUG with RW 28 seconds    Baseline  60 seconds    Time  8    Period  Weeks    Status  New      PT LONG TERM GOAL #2   Title  increase Berg balance score to 40/56 for safety    Baseline  will need to test    Time  8    Period  Weeks    Status  New      PT LONG TERM GOAL #3   Title  increase LE strength (particularly ABD and ext.) to 4/5 for functional gait and safety    Baseline  3+/5    Time  8    Period  Weeks    Status  New      PT LONG TERM GOAL #4   Title  be able to do a sit to stand without assistance and without losing balance for functional use    Time  8    Period  Weeks    Status  New            Plan - 09/26/19 1514    Clinical Impression Statement  STG met ,also discussed with pt being more active and walking more . pt did well today with gait without AD to work on balance but doe sneed increased cuing, worked on balacne and righting reaction in a variety of ways-pt tends to have wt back on heels which makes her loose balacne posterior but with anterior wt shift she fels like she will fall.    PT Treatment/Interventions  ADLs/Self Care Home Management;Functional mobility training;Stair training;Gait training;Therapeutic activities;Therapeutic exercise;Balance training;Neuromuscular re-education;Manual techniques;Patient/family education    PT Next Visit Plan  push balance and mobility       Patient will benefit from skilled therapeutic intervention in order to improve the following deficits and impairments:  Abnormal gait, Postural dysfunction, Decreased mobility, Cardiopulmonary status limiting activity, Decreased activity tolerance, Decreased endurance, Decreased strength, Difficulty walking, Decreased balance  Visit  Diagnosis: Balance problem  Muscle weakness (generalized)  Difficulty in walking, not elsewhere classified  Repeated falls     Problem List Patient Active Problem List   Diagnosis  Date Noted  . Familial hypercholesteremia 02/21/2018  . Psychogenic gait 10/21/2017  . Coronary artery disease involving native coronary artery of native heart without angina pectoris 06/09/2017  . Abnormal nuclear cardiac imaging test 05/25/2017  . PVC's (premature ventricular contractions) 05/17/2017  . Anxiety 12/29/2016  . Vascular parkinsonism ([place]) 06/29/2016  . Gait disorder 04/07/2016  . Intracranial carotid stenosis, bilateral 02/27/2016  . Essential hypertension   . Left-sided weakness 12/28/2015  . Headache 12/28/2015  . Adjustment reaction with anxiety 12/20/2015  . Left hemiparesis ([place])   . History of CVA with residual deficit   . Prediabetes   . SVT (supraventricular tachycardia) ([place]) 12/14/2015  . Stroke-like symptom 12/13/2015  . History of stroke   . Heart palpitations 09/06/2012  . Hypertension   . Familial hyperlipidemia   . Hypothyroidism   . Vitamin D deficiency   . Palpitations   . History of dizziness   . LVH (left ventricular hypertrophy)   . Aortic stenosis   . Mitral regurgitation   . SOB (shortness of breath)   . Difficulty walking     Addylin Manke,ANGIE PTA 09/26/2019, 3:17 PM  [place], [place], [address] Phone: [phone]   Fax:  [phone]  Name: Stacey Ramirez MRN: [id number] Date of Birth: 09/09/1940

## 2019-09-28 ENCOUNTER — Other Ambulatory Visit: Payer: Self-pay

## 2019-09-28 ENCOUNTER — Ambulatory Visit: Payer: PPO

## 2019-09-28 DIAGNOSIS — R262 Difficulty in walking, not elsewhere classified: Secondary | ICD-10-CM

## 2019-09-28 DIAGNOSIS — R296 Repeated falls: Secondary | ICD-10-CM

## 2019-09-28 DIAGNOSIS — M6281 Muscle weakness (generalized): Secondary | ICD-10-CM

## 2019-09-28 DIAGNOSIS — R2689 Other abnormalities of gait and mobility: Secondary | ICD-10-CM

## 2019-09-28 NOTE — Therapy (Signed)
[place] [address] [place], [place], [address] Phone: [phone]   Fax:  [phone]  Physical Therapy Treatment  Patient Details  Name: Stacey Ramirez MRN: [id number] Date of Birth: 11/08/1940 Referring Provider (PT): Shelia Media   Encounter Date: 09/28/2019  PT End of Session - 09/28/19 1324    Visit Number  6    Date for PT Re-Evaluation  11/10/19    Authorization Type  [employer]    PT Start Time  [id number]    PT Stop Time  [id number]    PT Time Calculation (min)  44 min       Past Medical History:  Diagnosis Date  . Adjustment reaction with anxiety 12/20/2015  . Aortic stenosis   . CAD (coronary artery disease) 06/09/2017   Nuc study 10/18: EF 69, inferolateral perfusion defect-possible small infarct with peri-infarct ischemia; intermediate risk // LHC 10/18: pLAD 25, pLCx 50, pRCA 25 >> med Rx  . Carotid stenosis, asymptomatic, bilateral 02/27/2016   Carotid US 5/17: Bilateral ICA 1-39  . Cerebrovascular accident (stroke) ([place])    a. 12/2015 - cryptogenic.  S/P MDT Linq.  . Difficulty walking   . Essential hypertension   . Gait disorder 04/07/2016  . Heart palpitations 09/06/2012  . History of CVA with residual deficit 12/14/2015  . History of dizziness   . History of echocardiogram    Echo 10/18: Vigorous LVF, EF 65-70, normal wall motion, grade 1 diastolic dysfunction, GLS -21.1%, mild RAE  . History of nuclear stress test    Nuclear stress test 10/18: EF 69, inf-lat defect c/w poss infarct with peri-infarct ischemia; Intermediate Risk  . Hyperlipidemia   . Hypothyroidism   . Left hemiparesis ([place])   . LVH (left ventricular hypertrophy)   . Mitral regurgitation    a. 12/2015 Echo: EF 60-65%, mild focal basal hypertrophy. No rwma, triv AI, mild MR, mildly dil LA w/o thrombus. No RA thrombus. No PFO.  Marland Kitchen Palpitations   . Prediabetes   . Stroke-like symptom 12/13/2015  . SVT (supraventricular tachycardia) ([place])  12/14/2015  . Vascular parkinsonism ([place]) 06/29/2016  . Vitamin D deficiency     Past Surgical History:  Procedure Laterality Date  . CARDIOVASCULAR STRESS TEST  2002   NORMAL  . EP IMPLANTABLE DEVICE N/A 12/17/2015   Procedure: Loop Recorder Insertion;  Surgeon: [name], MD;  Location: [facility];  Service: Cardiovascular;  Laterality: N/A;  . LEFT HEART CATH AND CORONARY ANGIOGRAPHY N/A 05/25/2017   Procedure: LEFT HEART CATH AND CORONARY ANGIOGRAPHY;  Surgeon: Sherren Mocha, MD;  Location: [facility];  Service: Cardiovascular;  Laterality: N/A;  . LOOP RECORDER IMPLANT    . TEE WITHOUT CARDIOVERSION N/A 12/17/2015   Procedure: TRANSESOPHAGEAL ECHOCARDIOGRAM (TEE);  Surgeon: Lelon Perla, MD;  Location: [facility];  Service: Cardiovascular;  Laterality: N/A;  Pt also needs a LOOP  . TRANSTHORACIC ECHOCARDIOGRAM  2008   SHOWED LEFT VENTRICULAR HYPERTROPHY AND MILD AORTIC STENOSIS    There were no vitals filed for this visit.  Subjective Assessment - 09/28/19 1318    Subjective  Pt. doing well.  Notes she is not doing much at home.    Patient Stated Goals  be stronger, walk better    Currently in Pain?  No/denies    Multiple Pain Sites  No  [facility] Adult PT Treatment/Exercise - 09/28/19 0001      Neuro Re-ed    Neuro Re-ed Details   B staggered stance with cross UE reach/cone stacking in RW (1 UE support on RW) 2 x 7 cones each way;  B SLS 2 x 10 sec in RW       Knee/Hip Exercises: Aerobic   Nustep  level 3 x 4 minutes      Knee/Hip Exercises: Standing   Heel Raises  Both;10 reps    Heel Raises Limitations  heel and toe raise in RW x 10 rpes     Hip Flexion  Right;Left;10 reps;Knee bent    Hip Flexion Limitations  LE clearance to 6" step in RW    Hip Extension  Right;Left;10 reps;Knee straight;Stengthening    Extension Limitations  Alternating 45 dg glute med kickback in [employer]    Other Standing Knee Exercises  Side  stepping 6 x 10 ft with B HH assist from therapist at mat table       Knee/Hip Exercises: Seated   Sit to Sand  2 sets;5 reps   1 UE pushoff from mat table to rW              PT Short Term Goals - 09/26/19 1514      PT SHORT TERM GOAL #1   Title  independent with her home HEP    Status  Achieved        PT Long Term Goals - 09/28/19 1325      PT LONG TERM GOAL #1   Title  TUG with RW 28 seconds    Baseline  60 seconds    Time  8    Period  Weeks    Status  On-going      PT LONG TERM GOAL #2   Title  increase Berg balance score to 40/56 for safety    Baseline  will need to test    Time  8    Period  Weeks    Status  On-going      PT LONG TERM GOAL #3   Title  increase LE strength (particularly ABD and ext.) to 4/5 for functional gait and safety    Baseline  3+/5    Time  8    Period  Weeks    Status  On-going      PT LONG TERM GOAL #4   Title  be able to do a sit to stand without assistance and without losing balance for functional use    Time  8    Period  Weeks    Status  On-going            Plan - 09/28/19 1325    Clinical Impression Statement  Stacey Ramirez doing well.  Notes, "I'm not doing much at home".  Pt. noting challenge with staggered stance cross-over cone stack to bolster.  Therapist providing close supervision throughout standing activities however pt. able to self-correct occasional minor LOB throughout with RW UE support.  LE strengthening focused on hip extension, abduction which was tolerated well.  Pt. pain free throughout session.  Encouraged pt. to walk with RW in home from room to room a few extra times a day for general conditioning.  Pt. verbalized understanding.    Comorbidities  anxiety, CAD, CVA    PT Treatment/Interventions  ADLs/Self Care Home Management;Functional mobility training;Stair training;Gait training;Therapeutic activities;Therapeutic exercise;Balance training;Neuromuscular re-education;Manual techniques;Patient/family  education    PT Next Visit Plan  push balance and mobility    Consulted and Agree with Plan of Care  Patient       Patient will benefit from skilled therapeutic intervention in order to improve the following deficits and impairments:  Abnormal gait, Postural dysfunction, Decreased mobility, Cardiopulmonary status limiting activity, Decreased activity tolerance, Decreased endurance, Decreased strength, Difficulty walking, Decreased balance  Visit Diagnosis: Balance problem  Muscle weakness (generalized)  Difficulty in walking, not elsewhere classified  Repeated falls     Problem List Patient Active Problem List   Diagnosis Date Noted  . Familial hypercholesteremia 02/21/2018  . Psychogenic gait 10/21/2017  . Coronary artery disease involving native coronary artery of native heart without angina pectoris 06/09/2017  . Abnormal nuclear cardiac imaging test 05/25/2017  . PVC's (premature ventricular contractions) 05/17/2017  . Anxiety 12/29/2016  . Vascular parkinsonism ([place]) 06/29/2016  . Gait disorder 04/07/2016  . Intracranial carotid stenosis, bilateral 02/27/2016  . Essential hypertension   . Left-sided weakness 12/28/2015  . Headache 12/28/2015  . Adjustment reaction with anxiety 12/20/2015  . Left hemiparesis ([place])   . History of CVA with residual deficit   . Prediabetes   . SVT (supraventricular tachycardia) ([place]) 12/14/2015  . Stroke-like symptom 12/13/2015  . History of stroke   . Heart palpitations 09/06/2012  . Hypertension   . Familial hyperlipidemia   . Hypothyroidism   . Vitamin D deficiency   . Palpitations   . History of dizziness   . LVH (left ventricular hypertrophy)   . Aortic stenosis   . Mitral regurgitation   . SOB (shortness of breath)   . Difficulty walking     Bess Harvest, [place] 09/28/19 3:19 PM   [place] [address] [place], [place], [address] Phone: [phone]   Fax:   [phone]  Name: Stacey Ramirez MRN: [id number] Date of Birth: Aug 02, 1941

## 2019-10-03 ENCOUNTER — Ambulatory Visit: Payer: PPO | Attending: Internal Medicine

## 2019-10-03 DIAGNOSIS — Z23 Encounter for immunization: Secondary | ICD-10-CM

## 2019-10-03 NOTE — Progress Notes (Signed)
   Covid-19 Vaccination Clinic  Name:  Stacey Ramirez    MRN: [id number] DOB: [birth year]  10/03/2019  [name] was observed post Covid-19 immunization for 15 minutes without incident. She was provided with Vaccine Information Sheet and instruction to access the V-Safe system.   [name] was instructed to call 911 with any severe reactions post vaccine: Marland Kitchen Difficulty breathing  . Swelling of face and throat  . A fast heartbeat  . A bad rash all over body  . Dizziness and weakness   Immunizations Administered    Name Date Dose VIS Date Route   Pfizer COVID-19 Vaccine 10/03/2019  2:57 PM 0.3 mL 07/14/2019 Intramuscular   Manufacturer: [place]   Lot: [id number]   [place]: [id number]

## 2019-10-05 ENCOUNTER — Other Ambulatory Visit: Payer: Self-pay

## 2019-10-05 ENCOUNTER — Ambulatory Visit: Payer: PPO | Attending: Internal Medicine | Admitting: Physical Therapy

## 2019-10-05 DIAGNOSIS — R296 Repeated falls: Secondary | ICD-10-CM

## 2019-10-05 DIAGNOSIS — M6281 Muscle weakness (generalized): Secondary | ICD-10-CM | POA: Diagnosis not present

## 2019-10-05 DIAGNOSIS — R262 Difficulty in walking, not elsewhere classified: Secondary | ICD-10-CM | POA: Diagnosis not present

## 2019-10-05 DIAGNOSIS — R2689 Other abnormalities of gait and mobility: Secondary | ICD-10-CM | POA: Insufficient documentation

## 2019-10-05 NOTE — Therapy (Signed)
[place], [place], [address] Phone: [phone]   Fax:  [phone]  Physical Therapy Treatment  Patient Details  Name: Stacey Ramirez MRN: [id number] Date of Birth: September 14, 1940 Referring Provider (PT): [place]   Encounter Date: 10/05/2019  PT End of Session - 10/05/19 1424    Visit Number  7    Date for PT Re-Evaluation  11/10/19    PT Start Time  1350    PT Stop Time  8413    PT Time Calculation (min)  55 min       Past Medical History:  Diagnosis Date  . Adjustment reaction with anxiety 12/20/2015  . Aortic stenosis   . CAD (coronary artery disease) 06/09/2017   Nuc study 10/18: EF 69, inferolateral perfusion defect-possible small infarct with peri-infarct ischemia; intermediate risk // LHC 10/18: pLAD 25, pLCx 50, pRCA 25 >> med Rx  . Carotid stenosis, asymptomatic, bilateral 02/27/2016   Carotid US 5/17: Bilateral ICA 1-39  . Cerebrovascular accident (stroke) ([place])    a. 12/2015 - cryptogenic.  S/P MDT Linq.  . Difficulty walking   . Essential hypertension   . Gait disorder 04/07/2016  . Heart palpitations 09/06/2012  . History of CVA with residual deficit 12/14/2015  . History of dizziness   . History of echocardiogram    Echo 10/18: Vigorous LVF, EF 65-70, normal wall motion, grade 1 diastolic dysfunction, GLS -21.1%, mild RAE  . History of nuclear stress test    Nuclear stress test 10/18: EF 69, inf-lat defect c/w poss infarct with peri-infarct ischemia; Intermediate Risk  . Hyperlipidemia   . Hypothyroidism   . Left hemiparesis ([place])   . LVH (left ventricular hypertrophy)   . Mitral regurgitation    a. 12/2015 Echo: EF 60-65%, mild focal basal hypertrophy. No rwma, triv AI, mild MR, mildly dil LA w/o thrombus. No RA thrombus. No PFO.  Marland Kitchen Palpitations   . Prediabetes   . Stroke-like symptom 12/13/2015  . SVT (supraventricular tachycardia) ([place]) 12/14/2015  . Vascular parkinsonism ([place])  06/29/2016  . Vitamin D deficiency     Past Surgical History:  Procedure Laterality Date  . CARDIOVASCULAR STRESS TEST  2002   NORMAL  . EP IMPLANTABLE DEVICE N/A 12/17/2015   Procedure: Loop Recorder Insertion;  Surgeon: [name], MD;  Location: [facility];  Service: Cardiovascular;  Laterality: N/A;  . LEFT HEART CATH AND CORONARY ANGIOGRAPHY N/A 05/25/2017   Procedure: LEFT HEART CATH AND CORONARY ANGIOGRAPHY;  Surgeon: Sherren Mocha, MD;  Location: [facility];  Service: Cardiovascular;  Laterality: N/A;  . LOOP RECORDER IMPLANT    . TEE WITHOUT CARDIOVERSION N/A 12/17/2015   Procedure: TRANSESOPHAGEAL ECHOCARDIOGRAM (TEE);  Surgeon: Lelon Perla, MD;  Location: [facility] ENDOSCOPY;  Service: Cardiovascular;  Laterality: N/A;  Pt also needs a LOOP  . TRANSTHORACIC ECHOCARDIOGRAM  2008   SHOWED LEFT VENTRICULAR HYPERTROPHY AND MILD AORTIC STENOSIS    There were no vitals filed for this visit.  Subjective Assessment - 10/05/19 1405    Subjective  "tired"    Currently in Pain?  No/denies                       [facility] Adult PT Treatment/Exercise - 10/05/19 0001      Ambulation/Gait   Gait Comments  400 feet with RW inside and outside working on spped and fluid steps vs step too and not to stop,constant  cuing needed   Supervsion     Standardized Balance Assessment   Standardized Balance Assessment  Berg Balance Test      Berg Balance Test   Sit to Stand  Able to stand  independently using hands    Standing Unsupported  Able to stand safely 2 minutes    Sitting with Back Unsupported but Feet Supported on Floor or Stool  Able to sit safely and securely 2 minutes    Stand to Sit  Controls descent by using hands    Transfers  Able to transfer safely, definite need of hands    Standing Unsupported with Eyes Closed  Able to stand 10 seconds with supervision    Standing Ubsupported with Feet Together  Needs help to attain position but able to stand for  30 seconds with feet together    From Standing, Reach Forward with Outstretched Arm  Can reach forward >12 cm safely (5")    From Standing Position, Pick up Object from Floor  Able to pick up shoe, needs supervision    From Standing Position, Turn to Look Behind Over each Shoulder  Looks behind from both sides and weight shifts well    Turn 360 Degrees  Able to turn 360 degrees safely but slowly    Standing Unsupported, Alternately Place Feet on Step/Stool  Able to complete >2 steps/needs minimal assist    Standing Unsupported, One Foot in [employer] balance while stepping or standing    Standing on One Leg  Unable to try or needs assist to prevent fall    Total Score  34      High Level Balance   High Level Balance Comments  ball toss on compliant surface SBA, on airex min/mod A with difficulty getting wt anterior through toes      Knee/Hip Exercises: Aerobic   Nustep  L 6 6 min      Knee/Hip Exercises: Machines for Strengthening   Cybex Knee Extension  10lb 2x10, SL 5lb x 10 each    Cybex Knee Flexion  25lb 2x10, SL 10lb x10 each     Total Gym Leg Press  20# 3 sets 10      Knee/Hip Exercises: Seated   Sit to Sand  5 reps;without UE support;3 sets   on mat with airex              PT Short Term Goals - 09/26/19 1514      PT SHORT TERM GOAL #1   Title  independent with her home HEP    Status  Achieved        PT Long Term Goals - 10/05/19 1407      PT LONG TERM GOAL #1   Title  TUG with RW 28 seconds    Baseline  42.5 sec    Status  Partially Met      PT LONG TERM GOAL #2   Title  increase Berg balance score to 40/56 for safety    Baseline  34/56    Status  On-going      PT LONG TERM GOAL #3   Title  increase LE strength (particularly ABD and ext.) to 4/5 for functional gait and safety    Status  On-going      PT LONG TERM GOAL #4   Title  be able to do a sit to stand without assistance and without losing balance for functional use    Status  On-going  Plan - 10/05/19 1432    Clinical Impression Statement  Good improvement in TUG score but needs cued to stay moving. BERG test scored today- fall risk. Deceased balance and trouble with LOB posterior. Working on fluid streps and keeping constant stride. Progressing towards goals.    PT Treatment/Interventions  ADLs/Self Care Home Management;Functional mobility training;Stair training;Gait training;Therapeutic activities;Therapeutic exercise;Balance training;Neuromuscular re-education;Manual techniques;Patient/family education    PT Next Visit Plan  push balance and mobility       Patient will benefit from skilled therapeutic intervention in order to improve the following deficits and impairments:  Abnormal gait, Postural dysfunction, Decreased mobility, Cardiopulmonary status limiting activity, Decreased activity tolerance, Decreased endurance, Decreased strength, Difficulty walking, Decreased balance  Visit Diagnosis: Balance problem  Muscle weakness (generalized)  Difficulty in walking, not elsewhere classified  Repeated falls     Problem List Patient Active Problem List   Diagnosis Date Noted  . Familial hypercholesteremia 02/21/2018  . Psychogenic gait 10/21/2017  . Coronary artery disease involving native coronary artery of native heart without angina pectoris 06/09/2017  . Abnormal nuclear cardiac imaging test 05/25/2017  . PVC's (premature ventricular contractions) 05/17/2017  . Anxiety 12/29/2016  . Vascular parkinsonism ([place]) 06/29/2016  . Gait disorder 04/07/2016  . Intracranial carotid stenosis, bilateral 02/27/2016  . Essential hypertension   . Left-sided weakness 12/28/2015  . Headache 12/28/2015  . Adjustment reaction with anxiety 12/20/2015  . Left hemiparesis ([place])   . History of CVA with residual deficit   . Prediabetes   . SVT (supraventricular tachycardia) ([place]) 12/14/2015  . Stroke-like symptom 12/13/2015  . History of stroke   . Heart  palpitations 09/06/2012  . Hypertension   . Familial hyperlipidemia   . Hypothyroidism   . Vitamin D deficiency   . Palpitations   . History of dizziness   . LVH (left ventricular hypertrophy)   . Aortic stenosis   . Mitral regurgitation   . SOB (shortness of breath)   . Difficulty walking     Faithann Natal,ANGIE PTA 10/05/2019, 2:38 PM  [place] [address] [place], [place], [address] Phone: [phone]   Fax:  [phone]  Name: Stacey Ramirez MRN: [id number] Date of Birth: 08/15/1940

## 2019-10-17 ENCOUNTER — Ambulatory Visit: Payer: PPO | Admitting: Physical Therapy

## 2019-10-19 ENCOUNTER — Ambulatory Visit: Payer: PPO | Admitting: Physical Therapy

## 2019-10-24 ENCOUNTER — Ambulatory Visit: Payer: PPO | Admitting: Physical Therapy

## 2019-10-24 ENCOUNTER — Other Ambulatory Visit: Payer: Self-pay

## 2019-10-24 ENCOUNTER — Encounter: Payer: Self-pay | Admitting: Physical Therapy

## 2019-10-24 DIAGNOSIS — N390 Urinary tract infection, site not specified: Secondary | ICD-10-CM | POA: Diagnosis not present

## 2019-10-24 DIAGNOSIS — R2689 Other abnormalities of gait and mobility: Secondary | ICD-10-CM

## 2019-10-24 DIAGNOSIS — R262 Difficulty in walking, not elsewhere classified: Secondary | ICD-10-CM

## 2019-10-24 DIAGNOSIS — N200 Calculus of kidney: Secondary | ICD-10-CM | POA: Diagnosis not present

## 2019-10-24 DIAGNOSIS — N3941 Urge incontinence: Secondary | ICD-10-CM | POA: Diagnosis not present

## 2019-10-24 DIAGNOSIS — M6281 Muscle weakness (generalized): Secondary | ICD-10-CM

## 2019-10-24 NOTE — Therapy (Signed)
[place], [place], [address] Phone: [phone]   Fax:  [phone]  Physical Therapy Treatment  Patient Details  Name: Stacey Ramirez MRN: [id number] Date of Birth: 1941-04-09 Referring Provider (PT): Pharr   Encounter Date: 10/24/2019  PT End of Session - 10/24/19 1428    Visit Number  8    Date for PT Re-Evaluation  11/10/19    Authorization Type  [employer]    PT Start Time  8032    PT Stop Time  1429    PT Time Calculation (min)  44 min    Activity Tolerance  Patient tolerated treatment well    Behavior During Therapy  [facility] for tasks assessed/performed       Past Medical History:  Diagnosis Date  . Adjustment reaction with anxiety 12/20/2015  . Aortic stenosis   . CAD (coronary artery disease) 06/09/2017   Nuc study 10/18: EF 69, inferolateral perfusion defect-possible small infarct with peri-infarct ischemia; intermediate risk // LHC 10/18: pLAD 25, pLCx 50, pRCA 25 >> med Rx  . Carotid stenosis, asymptomatic, bilateral 02/27/2016   Carotid US 5/17: Bilateral ICA 1-39  . Cerebrovascular accident (stroke) ([place])    a. 12/2015 - cryptogenic.  S/P MDT Linq.  . Difficulty walking   . Essential hypertension   . Gait disorder 04/07/2016  . Heart palpitations 09/06/2012  . History of CVA with residual deficit 12/14/2015  . History of dizziness   . History of echocardiogram    Echo 10/18: Vigorous LVF, EF 65-70, normal wall motion, grade 1 diastolic dysfunction, GLS -21.1%, mild RAE  . History of nuclear stress test    Nuclear stress test 10/18: EF 69, inf-lat defect c/w poss infarct with peri-infarct ischemia; Intermediate Risk  . Hyperlipidemia   . Hypothyroidism   . Left hemiparesis ([place])   . LVH (left ventricular hypertrophy)   . Mitral regurgitation    a. 12/2015 Echo: EF 60-65%, mild focal basal hypertrophy. No rwma, triv AI, mild MR, mildly dil LA w/o thrombus. No RA thrombus. No  PFO.  Marland Kitchen Palpitations   . Prediabetes   . Stroke-like symptom 12/13/2015  . SVT (supraventricular tachycardia) ([place]) 12/14/2015  . Vascular parkinsonism ([place]) 06/29/2016  . Vitamin D deficiency     Past Surgical History:  Procedure Laterality Date  . CARDIOVASCULAR STRESS TEST  2002   NORMAL  . EP IMPLANTABLE DEVICE N/A 12/17/2015   Procedure: Loop Recorder Insertion;  Surgeon: [name], MD;  Location: [place] [facility];  Service: Cardiovascular;  Laterality: N/A;  . LEFT HEART CATH AND CORONARY ANGIOGRAPHY N/A 05/25/2017   Procedure: LEFT HEART CATH AND CORONARY ANGIOGRAPHY;  Surgeon: Sherren Mocha, MD;  Location: [place] [facility];  Service: Cardiovascular;  Laterality: N/A;  . LOOP RECORDER IMPLANT    . TEE WITHOUT CARDIOVERSION N/A 12/17/2015   Procedure: TRANSESOPHAGEAL ECHOCARDIOGRAM (TEE);  Surgeon: Lelon Perla, MD;  Location: [facility] ENDOSCOPY;  Service: Cardiovascular;  Laterality: N/A;  Pt also needs a LOOP  . TRANSTHORACIC ECHOCARDIOGRAM  2008   SHOWED LEFT VENTRICULAR HYPERTROPHY AND MILD AORTIC STENOSIS    There were no vitals filed for this visit.  Subjective Assessment - 10/24/19 1348    Subjective  "Very very tires, caused I worked my self to death." Pt reports that she had some MD appointments    Currently in Pain?  No/denies  [facility] Adult PT Treatment/Exercise - 10/24/19 0001      High Level Balance   High Level Balance Activities  Backward walking;Side stepping;Marching forwards      Knee/Hip Exercises: Aerobic   Nustep  L 4 6 min      Knee/Hip Exercises: Machines for Strengthening   Cybex Knee Extension  10lb 2x10, SL 5lb x 10 each    Cybex Knee Flexion  25lb 2x10, SL 10lb x10 each       Knee/Hip Exercises: Standing   Other Standing Knee Exercises  L hip flex 5lb cuff with RW 3x10     Other Standing Knee Exercises  Standing march no assit 2x10       Knee/Hip Exercises: Seated   Sit to Sand  5 reps;without UE  support;3 sets   airex on mat               PT Short Term Goals - 09/26/19 1514      PT SHORT TERM GOAL #1   Title  independent with her home HEP    Status  Achieved        PT Long Term Goals - 10/05/19 1407      PT LONG TERM GOAL #1   Title  TUG with RW 28 seconds    Baseline  42.5 sec    Status  Partially Met      PT LONG TERM GOAL #2   Title  increase Berg balance score to 40/56 for safety    Baseline  34/56    Status  On-going      PT LONG TERM GOAL #3   Title  increase LE strength (particularly ABD and ext.) to 4/5 for functional gait and safety    Status  On-going      PT LONG TERM GOAL #4   Title  be able to do a sit to stand without assistance and without losing balance for functional use    Status  On-going            Plan - 10/24/19 1428    Clinical Impression Statement  All ambulation and balance activities without AD but with SBA. Constant cues through to lift LLE. Good strength remains with seated leg curls and extensions. Some LOB with backwards walking due to too shallow steps with LLE. Increase time needed after standing to take a step.    Personal Factors and Comorbidities  Comorbidity 3+    Comorbidities  anxiety, CAD, CVA    Examination-Activity Limitations  Bathing;Stairs;Lift;Locomotion Level;Stand;Carry;Transfers    Examination-Participation Restrictions  Laundry;Shop;Cleaning    Stability/Clinical Decision Making  Evolving/Moderate complexity    Rehab Potential  Fair    PT Frequency  2x / week    PT Treatment/Interventions  ADLs/Self Care Home Management;Functional mobility training;Stair training;Gait training;Therapeutic activities;Therapeutic exercise;Balance training;Neuromuscular re-education;Manual techniques;Patient/family education    PT Next Visit Plan  push balance and mobility       Patient will benefit from skilled therapeutic intervention in order to improve the following deficits and impairments:  Abnormal gait,  Postural dysfunction, Decreased mobility, Cardiopulmonary status limiting activity, Decreased activity tolerance, Decreased endurance, Decreased strength, Difficulty walking, Decreased balance  Visit Diagnosis: Balance problem  Muscle weakness (generalized)  Difficulty in walking, not elsewhere classified     Problem List Patient Active Problem List   Diagnosis Date Noted  . Familial hypercholesteremia 02/21/2018  . Psychogenic gait 10/21/2017  . Coronary artery disease involving native coronary artery of native heart without angina pectoris 06/09/2017  .  Abnormal nuclear cardiac imaging test 05/25/2017  . PVC's (premature ventricular contractions) 05/17/2017  . Anxiety 12/29/2016  . Vascular parkinsonism ([place]) 06/29/2016  . Gait disorder 04/07/2016  . Intracranial carotid stenosis, bilateral 02/27/2016  . Essential hypertension   . Left-sided weakness 12/28/2015  . Headache 12/28/2015  . Adjustment reaction with anxiety 12/20/2015  . Left hemiparesis ([place])   . History of CVA with residual deficit   . Prediabetes   . SVT (supraventricular tachycardia) ([place]) 12/14/2015  . Stroke-like symptom 12/13/2015  . History of stroke   . Heart palpitations 09/06/2012  . Hypertension   . Familial hyperlipidemia   . Hypothyroidism   . Vitamin D deficiency   . Palpitations   . History of dizziness   . LVH (left ventricular hypertrophy)   . Aortic stenosis   . Mitral regurgitation   . SOB (shortness of breath)   . Difficulty walking     Scot Jun, PTA 10/24/2019, 2:32 PM  [place], [place], [address] Phone: [phone]   Fax:  [phone]  Name: Stacey Ramirez MRN: [id number] Date of Birth: Dec 06, 1940

## 2019-10-26 ENCOUNTER — Encounter: Payer: Self-pay | Admitting: Physical Therapy

## 2019-10-26 ENCOUNTER — Ambulatory Visit: Payer: PPO | Admitting: Physical Therapy

## 2019-10-26 ENCOUNTER — Other Ambulatory Visit: Payer: Self-pay

## 2019-10-26 ENCOUNTER — Telehealth: Payer: Self-pay | Admitting: Internal Medicine

## 2019-10-26 DIAGNOSIS — M6281 Muscle weakness (generalized): Secondary | ICD-10-CM

## 2019-10-26 DIAGNOSIS — R2689 Other abnormalities of gait and mobility: Secondary | ICD-10-CM

## 2019-10-26 DIAGNOSIS — R262 Difficulty in walking, not elsewhere classified: Secondary | ICD-10-CM

## 2019-10-26 NOTE — Telephone Encounter (Signed)
Spoke with patient's husband Stacey Ramirez regarding Palliative services and all questions were answered.  He has declined Palliative services at this time.  I have emailed him a Palliative Brochure for him and his family to review and informed him that if they change their mind to please contact MD office so that they can send [place] another referral and he was in agreement with this.

## 2019-10-26 NOTE — Therapy (Signed)
[place], [place], [address] Phone: [phone]   Fax:  [phone]  Physical Therapy Treatment  Patient Details  Name: Stacey Ramirez MRN: [id number] Date of Birth: [birth year] Referring Provider (PT): Pharr   Encounter Date: 10/26/2019  PT End of Session - 10/26/19 1431    Visit Number  9    Date for PT Re-Evaluation  11/10/19    Authorization Type  [employer]    PT Start Time  1350    PT Stop Time  1430    PT Time Calculation (min)  40 min    Activity Tolerance  Patient tolerated treatment well    Behavior During Therapy  [facility] for tasks assessed/performed       Past Medical History:  Diagnosis Date  . Adjustment reaction with anxiety 12/20/2015  . Aortic stenosis   . CAD (coronary artery disease) 06/09/2017   Nuc study 10/18: EF 69, inferolateral perfusion defect-possible small infarct with peri-infarct ischemia; intermediate risk // LHC 10/18: pLAD 25, pLCx 50, pRCA 25 >> med Rx  . Carotid stenosis, asymptomatic, bilateral 02/27/2016   Carotid US 5/17: Bilateral ICA 1-39  . Cerebrovascular accident (stroke) ([place])    a. 12/2015 - cryptogenic.  S/P MDT Linq.  . Difficulty walking   . Essential hypertension   . Gait disorder 04/07/2016  . Heart palpitations 09/06/2012  . History of CVA with residual deficit 12/14/2015  . History of dizziness   . History of echocardiogram    Echo 10/18: Vigorous LVF, EF 65-70, normal wall motion, grade 1 diastolic dysfunction, GLS -21.1%, mild RAE  . History of nuclear stress test    Nuclear stress test 10/18: EF 69, inf-lat defect c/w poss infarct with peri-infarct ischemia; Intermediate Risk  . Hyperlipidemia   . Hypothyroidism   . Left hemiparesis ([place])   . LVH (left ventricular hypertrophy)   . Mitral regurgitation    a. 12/2015 Echo: EF 60-65%, mild focal basal hypertrophy. No rwma, triv AI, mild MR, mildly dil LA w/o thrombus. No RA thrombus. No  PFO.  Marland Kitchen Palpitations   . Prediabetes   . Stroke-like symptom 12/13/2015  . SVT (supraventricular tachycardia) ([place]) 12/14/2015  . Vascular parkinsonism ([place]) 06/29/2016  . Vitamin D deficiency     Past Surgical History:  Procedure Laterality Date  . CARDIOVASCULAR STRESS TEST  2002   NORMAL  . EP IMPLANTABLE DEVICE N/A 12/17/2015   Procedure: Loop Recorder Insertion;  Surgeon: [name], MD;  Location: [place] [facility];  Service: Cardiovascular;  Laterality: N/A;  . LEFT HEART CATH AND CORONARY ANGIOGRAPHY N/A 05/25/2017   Procedure: LEFT HEART CATH AND CORONARY ANGIOGRAPHY;  Surgeon: Sherren Mocha, MD;  Location: [place] [facility];  Service: Cardiovascular;  Laterality: N/A;  . LOOP RECORDER IMPLANT    . TEE WITHOUT CARDIOVERSION N/A 12/17/2015   Procedure: TRANSESOPHAGEAL ECHOCARDIOGRAM (TEE);  Surgeon: Lelon Perla, MD;  [facility];  Service: Cardiovascular;  Laterality: N/A;  Pt also needs a LOOP  . TRANSTHORACIC ECHOCARDIOGRAM  2008   SHOWED LEFT VENTRICULAR HYPERTROPHY AND MILD AORTIC STENOSIS    There were no vitals filed for this visit.  Subjective Assessment - 10/26/19 1356    Subjective  "Sleepy I guess"    Currently in Pain?  No/denies                       [facility] Adult PT Treatment/Exercise - 10/26/19  0001      Ambulation/Gait   Ambulation/Gait  Yes    Ambulation Distance (Feet)  80 Feet    Assistive device  None    Gait Pattern  Decreased arm swing - left;Decreased arm swing - right;Decreased step length - left;Decreased hip/knee flexion - left    Gait Comments  walked withot AD working on balance, turns and righting reaction CG A with 1 LOB requiring mod A to right      High Level Balance   High Level Balance Activities  Backward walking;Side stepping;Marching forwards      Knee/Hip Exercises: Aerobic   Nustep  L 4 6 min      Knee/Hip Exercises: Machines for Strengthening   Cybex Knee Extension  LLE 5lb 3x10     Cybex  Knee Flexion  2lb 2x15, LLE 10lb 2x10       Knee/Hip Exercises: Standing   Other Standing Knee Exercises  Standing march no assit 2x10 1.5lb cuff               PT Short Term Goals - 09/26/19 1514      PT SHORT TERM GOAL #1   Title  independent with her home HEP    Status  Achieved        PT Long Term Goals - 10/05/19 1407      PT LONG TERM GOAL #1   Title  TUG with RW 28 seconds    Baseline  42.5 sec    Status  Partially Met      PT LONG TERM GOAL #2   Title  increase Berg balance score to 40/56 for safety    Baseline  34/56    Status  On-going      PT LONG TERM GOAL #3   Title  increase LE strength (particularly ABD and ext.) to 4/5 for functional gait and safety    Status  On-going      PT LONG TERM GOAL #4   Title  be able to do a sit to stand without assistance and without losing balance for functional use    Status  On-going            Plan - 10/26/19 1433    Clinical Impression Statement  Hesitant and nervous throughout session. Again entire session does without RW. Increase time needed to start gait after standing. Constant cues needed for encouragement. Some LOB with backwards walking, min assist to correct decrease righting reaction. No reports of pain, good SL strength with curls and extensions.    Personal Factors and Comorbidities  Comorbidity 3+    Comorbidities  anxiety, CAD, CVA    Examination-Activity Limitations  Bathing;Stairs;Lift;Locomotion Level;Stand;Carry;Transfers    Examination-Participation Restrictions  Laundry;Shop;Cleaning    Stability/Clinical Decision Making  Evolving/Moderate complexity    PT Frequency  2x / week    PT Duration  8 weeks    PT Treatment/Interventions  ADLs/Self Care Home Management;Functional mobility training;Stair training;Gait training;Therapeutic activities;Therapeutic exercise;Balance training;Neuromuscular re-education;Manual techniques;Patient/family education    PT Next Visit Plan  push balance and  mobility       Patient will benefit from skilled therapeutic intervention in order to improve the following deficits and impairments:  Abnormal gait, Postural dysfunction, Decreased mobility, Cardiopulmonary status limiting activity, Decreased activity tolerance, Decreased endurance, Decreased strength, Difficulty walking, Decreased balance  Visit Diagnosis: Balance problem  Muscle weakness (generalized)  Difficulty in walking, not elsewhere classified     Problem List Patient Active Problem List   Diagnosis  Date Noted  . Familial hypercholesteremia 02/21/2018  . Psychogenic gait 10/21/2017  . Coronary artery disease involving native coronary artery of native heart without angina pectoris 06/09/2017  . Abnormal nuclear cardiac imaging test 05/25/2017  . PVC's (premature ventricular contractions) 05/17/2017  . Anxiety 12/29/2016  . Vascular parkinsonism ([place]) 06/29/2016  . Gait disorder 04/07/2016  . Intracranial carotid stenosis, bilateral 02/27/2016  . Essential hypertension   . Left-sided weakness 12/28/2015  . Headache 12/28/2015  . Adjustment reaction with anxiety 12/20/2015  . Left hemiparesis ([place])   . History of CVA with residual deficit   . Prediabetes   . SVT (supraventricular tachycardia) ([place]) 12/14/2015  . Stroke-like symptom 12/13/2015  . History of stroke   . Heart palpitations 09/06/2012  . Hypertension   . Familial hyperlipidemia   . Hypothyroidism   . Vitamin D deficiency   . Palpitations   . History of dizziness   . LVH (left ventricular hypertrophy)   . Aortic stenosis   . Mitral regurgitation   . SOB (shortness of breath)   . Difficulty walking     Scot Jun, PTA 10/26/2019, 2:35 PM  [place], [place], [address] Phone: [phone]   Fax:  [phone]  Name: ABRIAL ARRIGHI MRN: [id number] Date of Birth: March 03, 1941

## 2019-10-31 ENCOUNTER — Other Ambulatory Visit: Payer: Self-pay

## 2019-10-31 ENCOUNTER — Ambulatory Visit: Payer: PPO | Admitting: Physical Therapy

## 2019-10-31 DIAGNOSIS — R2689 Other abnormalities of gait and mobility: Secondary | ICD-10-CM | POA: Diagnosis not present

## 2019-10-31 DIAGNOSIS — R262 Difficulty in walking, not elsewhere classified: Secondary | ICD-10-CM

## 2019-10-31 DIAGNOSIS — M6281 Muscle weakness (generalized): Secondary | ICD-10-CM

## 2019-10-31 NOTE — Therapy (Signed)
[facility], [place], [address] Phone: [phone]   Fax:  [phone] Progress Note Reporting Period 09/12/19 to 10/31/19 for the first 10 visits  See note below for Objective Data and Assessment of Progress/Goals.      Physical Therapy Treatment  Patient Details  Name: Stacey Ramirez MRN: [id number] Date of Birth: Sep 18, 1940 Referring Provider (PT): Pharr   Encounter Date: 10/31/2019  PT End of Session - 10/31/19 1425    Visit Number  10    Date for PT Re-Evaluation  11/10/19    PT Start Time  1660    PT Stop Time  1440    PT Time Calculation (min)  45 min       Past Medical History:  Diagnosis Date  . Adjustment reaction with anxiety 12/20/2015  . Aortic stenosis   . CAD (coronary artery disease) 06/09/2017   Nuc study 10/18: EF 69, inferolateral perfusion defect-possible small infarct with peri-infarct ischemia; intermediate risk // LHC 10/18: pLAD 25, pLCx 50, pRCA 25 >> med Rx  . Carotid stenosis, asymptomatic, bilateral 02/27/2016   Carotid US 5/17: Bilateral ICA 1-39  . Cerebrovascular accident (stroke) ([place])    a. 12/2015 - cryptogenic.  S/P MDT Linq.  . Difficulty walking   . Essential hypertension   . Gait disorder 04/07/2016  . Heart palpitations 09/06/2012  . History of CVA with residual deficit 12/14/2015  . History of dizziness   . History of echocardiogram    Echo 10/18: Vigorous LVF, EF 65-70, normal wall motion, grade 1 diastolic dysfunction, GLS -21.1%, mild RAE  . History of nuclear stress test    Nuclear stress test 10/18: EF 69, inf-lat defect c/w poss infarct with peri-infarct ischemia; Intermediate Risk  . Hyperlipidemia   . Hypothyroidism   . Left hemiparesis ([place])   . LVH (left ventricular hypertrophy)   . Mitral regurgitation    a. 12/2015 Echo: EF 60-65%, mild focal basal hypertrophy. No rwma, triv AI, mild MR, mildly dil LA w/o thrombus. No RA thrombus. No PFO.  Marland Kitchen  Palpitations   . Prediabetes   . Stroke-like symptom 12/13/2015  . SVT (supraventricular tachycardia) ([facility]) 12/14/2015  . Vascular parkinsonism ([place]acey Ramirez) 06/29/2016  . Vitamin D deficiency     Past Surgical History:  Procedure Laterality Date  . CARDIOVASCULAR STRESS TEST  2002   NORMAL  . EP IMPLANTABLE DEVICE N/A 12/17/2015   Procedure: Loop Recorder Insertion;  Surgeon: [name], MD;  Location: [place] [facility];  Service: Cardiovascular;  Laterality: N/A;  . LEFT HEART CATH AND CORONARY ANGIOGRAPHY N/A 05/25/2017   Procedure: LEFT HEART CATH AND CORONARY ANGIOGRAPHY;  Surgeon: Sherren Mocha, MD;  Location: [place] [facility];  Service: Cardiovascular;  Laterality: N/A;  . LOOP RECORDER IMPLANT    . TEE WITHOUT CARDIOVERSION N/A 12/17/2015   Procedure: TRANSESOPHAGEAL ECHOCARDIOGRAM (TEE);  Surgeon: Lelon Perla, MD;  Location: [facility];  Service: Cardiovascular;  Laterality: N/A;  Pt also needs a LOOP  . TRANSTHORACIC ECHOCARDIOGRAM  2008   SHOWED LEFT VENTRICULAR HYPERTROPHY AND MILD AORTIC STENOSIS    There were no vitals filed for this visit.  Subjective Assessment - 10/31/19 1409    Subjective  "sleepy" "my daughter has been here and I have been mroe active"    Currently in Pain?  No/denies  [place] Adult PT Treatment/Exercise - 10/31/19 0001      Ambulation/Gait   Ambulation/Gait  Yes    Ambulation/Gait Assistance  4: Min guard    Ambulation Distance (Feet)  500 Feet    Assistive device  Rolling walker    Gait Pattern  Step-to pattern;Decreased step length - left;Shuffle    Ambulation Surface  Unlevel;Outdoor;Paved;Indoor    Curb  4: Min assist    Curb Details (indicate cue type and reason)  cuing for correct sequencing    Gait Comments  cuing with gait to increase Left stride      Knee/Hip Exercises: Aerobic   Nustep  L 4 6 min      Knee/Hip Exercises: Standing   Hip Flexion  Stengthening;Both;20 reps;Knee bent;Knee  straight   3# alt LE HHA   Hip Abduction  Stengthening;Both;20 reps;Knee straight   3# alt HHA   Hip Extension  Stengthening;Both;20 reps;Knee straight   3# HHA alt LE   Other Standing Knee Exercises  3# alt box tap 2 sets 20      Knee/Hip Exercises: Seated   Sit to Sand  2 sets;10 reps;without UE support   on mat with airex              PT Short Term Goals - 09/26/19 1514      PT SHORT TERM GOAL #1   Title  independent with her home HEP    Status  Achieved        PT Long Term Goals - 10/31/19 1411      PT LONG TERM GOAL #1   Title  TUG with RW 28 seconds    Baseline  37.1    Status  Partially Met      PT LONG TERM GOAL #3   Title  increase LE strength (particularly ABD and ext.) to 4/5 for functional gait and safety    Baseline  grossly 4-/5      PT LONG TERM GOAL #4   Title  be able to do a sit to stand without assistance and without losing balance for functional use    Baseline  pt still slow with STS and decreased initial balance    Status  On-going            Plan - 10/31/19 1423    Clinical Impression Statement  progressing with goals- TUG improved and LE strength grossly 4-/5. pt a bit more confused today and discussed with spouse and he verb decline. pt did well with gait outside but needed cuing to increase left stride as she went btwn step through and step too. min A on curb and cuing needed.    PT Treatment/Interventions  ADLs/Self Care Home Management;Functional mobility training;Stair training;Gait training;Therapeutic activities;Therapeutic exercise;Balance training;Neuromuscular re-education;Manual techniques;Patient/family education    PT Next Visit Plan  push balance and mobility       Patient will benefit from skilled therapeutic intervention in order to improve the following deficits and impairments:  Abnormal gait, Postural dysfunction, Decreased mobility, Cardiopulmonary status limiting activity, Decreased activity tolerance, Decreased  endurance, Decreased strength, Difficulty walking, Decreased balance  Visit Diagnosis: Muscle weakness (generalized)  Balance problem  Difficulty in walking, not elsewhere classified     Problem List Patient Active Problem List   Diagnosis Date Noted  . Familial hypercholesteremia 02/21/2018  . Psychogenic gait 10/21/2017  . Coronary artery disease involving native coronary artery of native heart without angina pectoris 06/09/2017  . Abnormal nuclear cardiac imaging test 05/25/2017  .  PVC's (premature ventricular contractions) 05/17/2017  . Anxiety 12/29/2016  . Vascular parkinsonism ([place]) 06/29/2016  . Gait disorder 04/07/2016  . Intracranial carotid stenosis, bilateral 02/27/2016  . Essential hypertension   . Left-sided weakness 12/28/2015  . Headache 12/28/2015  . Adjustment reaction with anxiety 12/20/2015  . Left hemiparesis ([place])   . History of CVA with residual deficit   . Prediabetes   . SVT (supraventricular tachycardia) ([place]) 12/14/2015  . Stroke-like symptom 12/13/2015  . History of stroke   . Heart palpitations 09/06/2012  . Hypertension   . Familial hyperlipidemia   . Hypothyroidism   . Vitamin D deficiency   . Palpitations   . History of dizziness   . LVH (left ventricular hypertrophy)   . Aortic stenosis   . Mitral regurgitation   . SOB (shortness of breath)   . Difficulty walking     Joziah Dollins,ANGIE PTA 10/31/2019, 2:26 PM  [place], [place], [address] Phone: [phone]   Fax:  [phone]  Name: ALYAH BOEHNING MRN: [id number] Date of Birth: 1941/01/08

## 2019-11-02 ENCOUNTER — Other Ambulatory Visit: Payer: Self-pay

## 2019-11-02 ENCOUNTER — Ambulatory Visit: Payer: PPO | Attending: Internal Medicine | Admitting: Physical Therapy

## 2019-11-02 DIAGNOSIS — M6281 Muscle weakness (generalized): Secondary | ICD-10-CM | POA: Diagnosis not present

## 2019-11-02 DIAGNOSIS — R296 Repeated falls: Secondary | ICD-10-CM

## 2019-11-02 DIAGNOSIS — R262 Difficulty in walking, not elsewhere classified: Secondary | ICD-10-CM | POA: Diagnosis not present

## 2019-11-02 DIAGNOSIS — R2689 Other abnormalities of gait and mobility: Secondary | ICD-10-CM | POA: Diagnosis not present

## 2019-11-02 NOTE — Therapy (Signed)
[place], [place], [address] Phone: [phone]   Fax:  [phone]  Physical Therapy Treatment  Patient Details  Name: Stacey Ramirez MRN: [id number] Date of Birth: May 27, 1941 Referring Provider (PT): Pharr   Encounter Date: 11/02/2019  PT End of Session - 11/02/19 1434    Visit Number  [id number]    Date for PT Re-Evaluation  11/10/19    PT Start Time  1400    PT Stop Time  1440    PT Time Calculation (min)  40 min       Past Medical History:  Diagnosis Date  . Adjustment reaction with anxiety 12/20/2015  . Aortic stenosis   . CAD (coronary artery disease) 06/09/2017   Nuc study 10/18: EF 69, inferolateral perfusion defect-possible small infarct with peri-infarct ischemia; intermediate risk // LHC 10/18: pLAD 25, pLCx 50, pRCA 25 >> med Rx  . Carotid stenosis, asymptomatic, bilateral 02/27/2016   Carotid US 5/17: Bilateral ICA 1-39  . Cerebrovascular accident (stroke) ([place])    a. 12/2015 - cryptogenic.  S/P MDT Linq.  . Difficulty walking   . Essential hypertension   . Gait disorder 04/07/2016  . Heart palpitations 09/06/2012  . History of CVA with residual deficit 12/14/2015  . History of dizziness   . History of echocardiogram    Echo 10/18: Vigorous LVF, EF 65-70, normal wall motion, grade 1 diastolic dysfunction, GLS -21.1%, mild RAE  . History of nuclear stress test    Nuclear stress test 10/18: EF 69, inf-lat defect c/w poss infarct with peri-infarct ischemia; Intermediate Risk  . Hyperlipidemia   . Hypothyroidism   . Left hemiparesis ([place])   . LVH (left ventricular hypertrophy)   . Mitral regurgitation    a. 12/2015 Echo: EF 60-65%, mild focal basal hypertrophy. No rwma, triv AI, mild MR, mildly dil LA w/o thrombus. No RA thrombus. No PFO.  Marland Kitchen Palpitations   . Prediabetes   . Stroke-like symptom 12/13/2015  . SVT (supraventricular tachycardia) ([place]) 12/14/2015  . Vascular parkinsonism ([place])  06/29/2016  . Vitamin D deficiency     Past Surgical History:  Procedure Laterality Date  . CARDIOVASCULAR STRESS TEST  2002   NORMAL  . EP IMPLANTABLE DEVICE N/A 12/17/2015   Procedure: Loop Recorder Insertion;  Surgeon: [name], MD;  Location: [facility];  Service: Cardiovascular;  Laterality: N/A;  . LEFT HEART CATH AND CORONARY ANGIOGRAPHY N/A 05/25/2017   Procedure: LEFT HEART CATH AND CORONARY ANGIOGRAPHY;  Surgeon: Sherren Mocha, MD;  Location: [facility];  Service: Cardiovascular;  Laterality: N/A;  . LOOP RECORDER IMPLANT    . TEE WITHOUT CARDIOVERSION N/A 12/17/2015   Procedure: TRANSESOPHAGEAL ECHOCARDIOGRAM (TEE);  Surgeon: Lelon Perla, MD;  Location: [facility] ENDOSCOPY;  Service: Cardiovascular;  Laterality: N/A;  Pt also needs a LOOP  . TRANSTHORACIC ECHOCARDIOGRAM  2008   SHOWED LEFT VENTRICULAR HYPERTROPHY AND MILD AORTIC STENOSIS    There were no vitals filed for this visit.  Subjective Assessment - 11/02/19 1358    Subjective  doing okay- husband is at [facility] to get checked for depression    Multiple Pain Sites  No                       [facility] Adult PT Treatment/Exercise - 11/02/19 0001      Ambulation/Gait   Gait Comments  walked without AD working on balance, turns and righting reaction  CG A    very hesitant and difficult to get and keep going esp Lt foo     High Level Balance   High Level Balance Activities  Side stepping;Backward walking;Marching forwards   3# HHA 15 feet 3 times each     Knee/Hip Exercises: Aerobic   Nustep  L 5 6 min      Knee/Hip Exercises: Machines for Strengthening   Total Gym Leg Press  20# 3 sets 10      Knee/Hip Exercises: Standing   Hip Flexion  Stengthening;Both;20 reps;Knee bent;Knee straight   3# LE alt HHA   Hip Abduction  Stengthening;Both;20 reps;Knee straight   3# LE atl HHA   Hip Extension  Stengthening;Both;20 reps;Knee straight   3# HHA alt LE     Knee/Hip Exercises: Seated    Long Arc Quad  Strengthening;Both;2 sets;10 reps   red tband   Clamshell with TheraBand  Green    Hamstring Curl  Strengthening;Right;Left;2 sets;10 reps   green tband              PT Short Term Goals - 09/26/19 1514      PT SHORT TERM GOAL #1   Title  independent with her home HEP    Status  Achieved        PT Long Term Goals - 10/31/19 1411      PT LONG TERM GOAL #1   Title  TUG with RW 28 seconds    Baseline  37.1    Status  Partially Met      PT LONG TERM GOAL #3   Title  increase LE strength (particularly ABD and ext.) to 4/5 for functional gait and safety    Baseline  grossly 4-/5      PT LONG TERM GOAL #4   Title  be able to do a sit to stand without assistance and without losing balance for functional use    Baseline  pt still slow with STS and decreased initial balance    Status  On-going            Plan - 11/02/19 1434    Clinical Impression Statement  amb btwn machines HHA nd pt did very well but when tried to amb without AD for balcne pt is very hesitant and unsteady. Continue to work on [place] nd func to tansition into func activities    PT Treatment/Interventions  ADLs/Self Care Home Management;Functional mobility training;Stair training;Gait training;Therapeutic activities;Therapeutic exercise;Balance training;Neuromuscular re-education;Manual techniques;Patient/family education    PT Next Visit Plan  push balance and mobility       Patient will benefit from skilled therapeutic intervention in order to improve the following deficits and impairments:  Abnormal gait, Postural dysfunction, Decreased mobility, Cardiopulmonary status limiting activity, Decreased activity tolerance, Decreased endurance, Decreased strength, Difficulty walking, Decreased balance  Visit Diagnosis: Muscle weakness (generalized)  Balance problem  Difficulty in walking, not elsewhere classified  Repeated falls     Problem List Patient Active Problem List    Diagnosis Date Noted  . Familial hypercholesteremia 02/21/2018  . Psychogenic gait 10/21/2017  . Coronary artery disease involving native coronary artery of native heart without angina pectoris 06/09/2017  . Abnormal nuclear cardiac imaging test 05/25/2017  . PVC's (premature ventricular contractions) 05/17/2017  . Anxiety 12/29/2016  . Vascular parkinsonism ([place]) 06/29/2016  . Gait disorder 04/07/2016  . Intracranial carotid stenosis, bilateral 02/27/2016  . Essential hypertension   . Left-sided weakness 12/28/2015  . Headache 12/28/2015  . Adjustment reaction with  anxiety 12/20/2015  . Left hemiparesis ([place])   . History of CVA with residual deficit   . Prediabetes   . SVT (supraventricular tachycardia) ([place]) 12/14/2015  . Stroke-like symptom 12/13/2015  . History of stroke   . Heart palpitations 09/06/2012  . Hypertension   . Familial hyperlipidemia   . Hypothyroidism   . Vitamin D deficiency   . Palpitations   . History of dizziness   . LVH (left ventricular hypertrophy)   . Aortic stenosis   . Mitral regurgitation   . SOB (shortness of breath)   . Difficulty walking     Amauris Debois,ANGIE PTA 11/02/2019, 2:36 PM  [place], [place], [address] Phone: [phone]   Fax:  [phone]  Name: DAVONDA AUSLEY MRN: [id number] Date of Birth: 1940/10/19

## 2019-11-10 ENCOUNTER — Ambulatory Visit: Payer: PPO | Admitting: Cardiovascular Disease

## 2019-11-10 ENCOUNTER — Encounter: Payer: Self-pay | Admitting: Cardiovascular Disease

## 2019-11-10 ENCOUNTER — Other Ambulatory Visit: Payer: Self-pay

## 2019-11-10 VITALS — BP 112/62 | HR 60 | Ht 62.0 in | Wt 126.5 lb

## 2019-11-10 DIAGNOSIS — I10 Essential (primary) hypertension: Secondary | ICD-10-CM

## 2019-11-10 NOTE — Patient Instructions (Addendum)
Medication Instructions:  Your physician recommends that you continue on your current medications as directed. Please refer to the Current Medication list given to you today.  *If you need a refill on your cardiac medications before your next appointment, please call your pharmacy*   Lab Work: None Ordered If you have labs (blood work) drawn today and your tests are completely normal, you will receive your results only by: Marland Kitchen MyChart Message (if you have MyChart) OR . A paper copy in the mail If you have any lab test that is abnormal or we need to change your treatment, we will call you to review the results.   Testing/Procedures: None Ordered   Follow-Up: At [facility], you and your health needs are our priority.  As part of our continuing mission to provide you with exceptional heart care, we have created designated Provider Care Teams.  These Care Teams include your primary Cardiologist (physician) and Advanced Practice Providers (APPs -  Physician Assistants and Nurse Practitioners) who all work together to provide you with the care you need, when you need it.   Your next appointment:   6 month(s)  The format for your next appointment:   Either In Person or Virtual  Provider:   You may see Mertie Moores, MD or one of the following Advanced Practice Providers on your designated Care Team:    Richardson Dopp, PA-C  [place], [place]  Daune Perch, [place]

## 2019-11-10 NOTE — Progress Notes (Signed)
Office Visit    Patient Name: Stacey Ramirez Date of Encounter: 11/10/2019  Primary Care Provider:  Deland Pretty, MD Primary Cardiologist:  Stacey Browner, MD  P. Reymundo Winship, MD    79 year old female with a history of palpitations, hypertension, hyperlipidemia, and recent stroke who presents for follow-up.    Past Medical History    Past Medical History:  Diagnosis Date  . Adjustment reaction with anxiety 12/20/2015  . Aortic stenosis   . CAD (coronary artery disease) 06/09/2017   Nuc study 10/18: EF 69, inferolateral perfusion defect-possible small infarct with peri-infarct ischemia; intermediate risk // LHC 10/18: pLAD 25, pLCx 50, pRCA 25 >> med Rx  . Carotid stenosis, asymptomatic, bilateral 02/27/2016   Carotid US 5/17: Bilateral ICA 1-39  . Cerebrovascular accident (stroke) ([place])    a. 12/2015 - cryptogenic.  S/P MDT Linq.  . Difficulty walking   . Essential hypertension   . Gait disorder 04/07/2016  . Heart palpitations 09/06/2012  . History of CVA with residual deficit 12/14/2015  . History of dizziness   . History of echocardiogram    Echo 10/18: Vigorous LVF, EF 65-70, normal wall motion, grade 1 diastolic dysfunction, GLS -21.1%, mild RAE  . History of nuclear stress test    Nuclear stress test 10/18: EF 69, inf-lat defect c/w poss infarct with peri-infarct ischemia; Intermediate Risk  . Hyperlipidemia   . Hypothyroidism   . Left hemiparesis ([place])   . LVH (left ventricular hypertrophy)   . Mitral regurgitation    a. 12/2015 Echo: EF 60-65%, mild focal basal hypertrophy. No rwma, triv AI, mild MR, mildly dil LA w/o thrombus. No RA thrombus. No PFO.  Marland Kitchen Palpitations   . Prediabetes   . Stroke-like symptom 12/13/2015  . SVT (supraventricular tachycardia) ([place]) 12/14/2015  . Vascular parkinsonism ([place]) 06/29/2016  . Vitamin D deficiency    Past Surgical History:  Procedure Laterality Date  . CARDIOVASCULAR STRESS TEST  2002   NORMAL  . EP IMPLANTABLE DEVICE N/A 12/17/2015     Procedure: Loop Recorder Insertion;  Surgeon: Stacey Grayer, MD;  Location: [facility];  Service: Cardiovascular;  Laterality: N/A;  . LEFT HEART CATH AND CORONARY ANGIOGRAPHY N/A 05/25/2017   Procedure: LEFT HEART CATH AND CORONARY ANGIOGRAPHY;  Surgeon: Stacey Mocha, MD;  Location: [facility];  Service: Cardiovascular;  Laterality: N/A;  . LOOP RECORDER IMPLANT    . TEE WITHOUT CARDIOVERSION N/A 12/17/2015   Procedure: TRANSESOPHAGEAL ECHOCARDIOGRAM (TEE);  Surgeon: Stacey Perla, MD;  Location: [facility] ENDOSCOPY;  Service: Cardiovascular;  Laterality: N/A;  Pt also needs a LOOP  . TRANSTHORACIC ECHOCARDIOGRAM  2008   SHOWED LEFT VENTRICULAR HYPERTROPHY AND MILD AORTIC STENOSIS    Allergies  Allergies  Allergen Reactions  . Sulfa Drugs Cross Reactors Itching  . Zocor [Simvastatin] Other (See Comments)    Feels like she is going to pass out, weakness  . Crestor [Rosuvastatin Calcium]     Muscle weakness  . Statins Other (See Comments)    Unable to walk or stand. Unable to walk or stand. Unable to walk or stand.  . Sulfa Antibiotics     welts  . Micardis [Telmisartan] Other (See Comments)    Feels like she is going to pass out    Notes from Stacey Bayley, NP    80 year old female previously followed by Dr. Mare Ramirez for hypertension and palpitations. She recently was admitted for cryptogenic stroke (right basal ganglia infarct), and underwent TEE revealing normal LV  function without evidence of atrial thrombus. She subsequently underwent placement of an implantable loop recorder. She has had no events on this recorder up to this point. She has been recovering well from her stroke and can now walk, though with a limp. She is encouraged overall. Her blood pressure at home has been running in the 140s to 150s, and her husband has been checking it about 3 times per day. She has not been having any palpitations, chest pain, dyspnea, PND, orthopnea, dizziness, syncope, edema,  or early satiety.  Oct. 20 ,2017: Stacey Ramirez is seen for the first time today.  Transfer from [place] . Has some leg / feet swelling  - likely due to the amlodipine .   Dec 01, 2016:  Is having some difficulty in walking since her stroke.   Working with PT.  Brought her BP log with her.   Has some elevated readings and then later will have low readings.  Suggested that she might be eating salty foods at times .   No CP or dyspnea    Has an implantable loop recorder.   Has not had any syncope Has not had it interrogated.      Has poor balance   Feb. 11, 2019  She has history of hypertension and hyperlipidemia. She was admitted with some shortness of breath  Cath 05/25/17  revealed mild - moderate CAD ,  No obstructive lesion s  Has done well since then .  Has some DOE with exercise  Is still very limited by her stroke   Aug. 26, 2019: Stacey Ramirez is seen today for follow up  She is had a stroke.  She has an implantable loop recorder in place. Was recovering from her stroke. Had sudden weakness of her left leg.    Has had some elevated BP .   September 13, 2018: This is Stacey Ramirez is seen today for follow-up visit.  She has a history of hypertension and has had a stroke.  She has an implantable loop recorder in place.  Has had several bad falls over the past several months  Has been taking amlodipine 10 mg at night.  No CP ,  Slight  dyspnea  Is using a walker now.   Is unsteady on her feet   May 11, 2019:   Stacey Ramirez is seen today for a follow-up of her stroke.  She has an implantable loop in place. She has a history of hypertension and hypothyroidism. Complains of leg swelling - likely  from the amlodipine   No cp, no dyspnea  November 10, 2019: I had is seen today for a follow-up visit.  She has a history of hypertension.  She is had a stroke and has an implantable loop recorder in place.  Her husband Stacey Ramirez was with her today.  He is concerned that sometimes her heart  rate gets a little slow.  She is never had any symptoms of syncope or presyncope.  Blood pressure seems to be well controlled.  He has stopped taking her blood pressure first thing in the morning .  We discussed the fact that everyone's adrenaline surge first in the morning will cause the blood pressure be high.  She avoids salt. She denies any chest pain or shortness of breath.  She denies any syncope or presyncope.     Blood pressure seems to be very well controlled. Very rare episodes of dyspnea. aspirin EC 325 MG EC tablet Take 1 tablet (325 mg total) by mouth  daily. 12/17/15  Yes Lavina Hamman, MD  B Complex-C-Folic Acid (STRESS FORMULA) TABS Take 1 tablet by mouth daily.    Yes Historical Provider, MD  carboxymethylcellulose (REFRESH PLUS) 0.5 % SOLN Place 1 drop into both eyes 3 (three) times daily as needed.   Yes Historical Provider, MD  clopidogrel (PLAVIX) 75 MG tablet Take 1 tablet (75 mg total) by mouth daily. 12/24/15  Yes Ivan Anchors Love, PA-C  estradiol (ESTRACE) 0.1 MG/GM vaginal cream Place 2 g vaginally as needed (dryness).    Yes Historical Provider, MD  irbesartan (AVAPRO) 300 MG tablet Take 1 tablet (300 mg total) by mouth daily. 12/29/15  Yes Velvet Bathe, MD  levothyroxine (SYNTHROID, LEVOTHROID) 25 MCG tablet Take 25 mcg by mouth daily before breakfast.  09/21/14  Yes Historical Provider, MD  LOTEMAX 0.5 % ophthalmic suspension Place 1 drop into both eyes 4 (four) times daily as needed. For dry itchy eyes 02/17/13  Yes Historical Provider, MD  metoprolol tartrate (LOPRESSOR) 25 MG tablet Take 0.5 tablets (12.5 mg total) by mouth 2 (two) times daily. 12/24/15  Yes Ivan Anchors Love, PA-C  Multiple Vitamin (MULTIVITAMIN WITH MINERALS) TABS tablet Take 1 tablet by mouth daily.   Yes Historical Provider, MD  polyethylene glycol (MIRALAX / GLYCOLAX) packet Take 17 g by mouth daily. Patient taking differently: Take 17 g by mouth daily as needed for moderate constipation.  12/17/15  Yes  Lavina Hamman, MD  sertraline (ZOLOFT) 50 MG tablet Take 50 mg by mouth daily.  01/02/16  Yes Historical Provider, MD    Review of Systems     Physical Exam: Blood pressure 112/62, pulse 60, height 5\' 2"  (1.575 m), weight 126 lb 8 oz (57.4 kg), SpO2 96 %.  GEN:  Elderly female, NAD  HEENT: Normal NECK: No JVD; No carotid bruits LYMPHATICS: No lymphadenopathy CARDIAC: RRR , no murmurs, rubs, gallops RESPIRATORY:  Clear to auscultation without rales, wheezing or rhonchi  ABDOMEN: Soft, non-tender, non-distended MUSCULOSKELETAL:  No edema; No deformity  SKIN: Warm and dry NEUROLOGIC:  Alert and oriented x 3    Accessory Clinical Findings    ECG   :     Assessment & Plan    1.  Cryptogenic stroke:    No further stroke symptom .  Has a loop recorder    2. Essential hypertension:        bp is better   4. Hyperlipidemia:        5. CAD -      Mertie Moores, MD  11/10/2019 3:25 PM    [facility],  [place], [place]  [address] Pager [phone] Phone: [phone]; Fax: [phone]

## 2019-11-14 ENCOUNTER — Ambulatory Visit: Payer: PPO | Admitting: Physical Therapy

## 2019-11-14 ENCOUNTER — Other Ambulatory Visit: Payer: Self-pay

## 2019-11-14 ENCOUNTER — Encounter: Payer: Self-pay | Admitting: Physical Therapy

## 2019-11-14 DIAGNOSIS — M6281 Muscle weakness (generalized): Secondary | ICD-10-CM | POA: Diagnosis not present

## 2019-11-14 DIAGNOSIS — R262 Difficulty in walking, not elsewhere classified: Secondary | ICD-10-CM

## 2019-11-14 DIAGNOSIS — R2689 Other abnormalities of gait and mobility: Secondary | ICD-10-CM

## 2019-11-14 NOTE — Therapy (Signed)
[place], [place], [address] Phone: [phone]   Fax:  [phone]  Physical Therapy Treatment  Patient Details  Name: Stacey Ramirez MRN: [id number] Date of Birth: 05/14/1941 Referring Provider (PT): Shelia Media   Encounter Date: 11/14/2019  PT End of Session - 11/14/19 5170    Visit Number  12    Date for PT Re-Evaluation  11/10/19    Authorization Type  [employer]    Activity Tolerance  Patient tolerated treatment well    Behavior During Therapy  [facility] for tasks assessed/performed       Past Medical History:  Diagnosis Date  . Adjustment reaction with anxiety 12/20/2015  . Aortic stenosis   . CAD (coronary artery disease) 06/09/2017   Nuc study 10/18: EF 69, inferolateral perfusion defect-possible small infarct with peri-infarct ischemia; intermediate risk // LHC 10/18: pLAD 25, pLCx 50, pRCA 25 >> med Rx  . Carotid stenosis, asymptomatic, bilateral 02/27/2016   Carotid US 5/17: Bilateral ICA 1-39  . Cerebrovascular accident (stroke) ([place])    a. 12/2015 - cryptogenic.  S/P MDT Linq.  . Difficulty walking   . Essential hypertension   . Gait disorder 04/07/2016  . Heart palpitations 09/06/2012  . History of CVA with residual deficit 12/14/2015  . History of dizziness   . History of echocardiogram    Echo 10/18: Vigorous LVF, EF 65-70, normal wall motion, grade 1 diastolic dysfunction, GLS -21.1%, mild RAE  . History of nuclear stress test    Nuclear stress test 10/18: EF 69, inf-lat defect c/w poss infarct with peri-infarct ischemia; Intermediate Risk  . Hyperlipidemia   . Hypothyroidism   . Left hemiparesis ([place])   . LVH (left ventricular hypertrophy)   . Mitral regurgitation    a. 12/2015 Echo: EF 60-65%, mild focal basal hypertrophy. No rwma, triv AI, mild MR, mildly dil LA w/o thrombus. No RA thrombus. No PFO.  Marland Kitchen Palpitations   . Prediabetes   . Stroke-like symptom 12/13/2015  . SVT  (supraventricular tachycardia) ([place]) 12/14/2015  . Vascular parkinsonism ([place]) 06/29/2016  . Vitamin D deficiency     Past Surgical History:  Procedure Laterality Date  . CARDIOVASCULAR STRESS TEST  2002   NORMAL  . EP IMPLANTABLE DEVICE N/A 12/17/2015   Procedure: Loop Recorder Insertion;  Surgeon: [name], MD;  Location: [place] CV LAB;  Service: Cardiovascular;  Laterality: N/A;  . LEFT HEART CATH AND CORONARY ANGIOGRAPHY N/A 05/25/2017   Procedure: LEFT HEART CATH AND CORONARY ANGIOGRAPHY;  Surgeon: Sherren Mocha, MD;  Location: [place] CV LAB;  Service: Cardiovascular;  Laterality: N/A;  . LOOP RECORDER IMPLANT    . TEE WITHOUT CARDIOVERSION N/A 12/17/2015   Procedure: TRANSESOPHAGEAL ECHOCARDIOGRAM (TEE);  Surgeon: Lelon Perla, MD;  Location: [facility] ENDOSCOPY;  Service: Cardiovascular;  Laterality: N/A;  Pt also needs a LOOP  . TRANSTHORACIC ECHOCARDIOGRAM  2008   SHOWED LEFT VENTRICULAR HYPERTROPHY AND MILD AORTIC STENOSIS    There were no vitals filed for this visit.  Subjective Assessment - 11/14/19 1303    Subjective  "Sad that my daughter had to leave" Other than that I am ok    Currently in Pain?  No/denies         [facility] PT Assessment - 11/14/19 0001      Strength   Left Hip Flexion  4-/5    Left Hip Extension  4/5    Left Hip ABduction  4-/5  [place] Adult PT Treatment/Exercise - 11/14/19 0001      Ambulation/Gait   Curb  4: Min assist    Gait Comments  walked without AD working on balance, turns and righting reaction CG A from fym to from [employer]      Knee/Hip Exercises: Aerobic   Tread Mill  1 MPH  1 incline x 2 min    Nustep  L 5 6 min      Knee/Hip Exercises: Machines for Strengthening   Cybex Knee Extension  10lb x15, x10     Cybex Knee Flexion  25lb 2x15, LLE 10lb x15     Total Gym Leg Press  20# 3 sets 10      Knee/Hip Exercises: Standing   Other Standing Knee Exercises  4in alt box taps SBA to CGA       Knee/Hip Exercises: Supine   Straight Leg Raises  Left;1 set;10 reps    Other Supine Knee/Hip Exercises  LLE hip ext green band 2x1-               PT Short Term Goals - 11/14/19 1400      PT SHORT TERM GOAL #1   Title  independent with her home HEP    Status  Achieved        PT Long Term Goals - 11/14/19 1400      PT LONG TERM GOAL #1   Title  TUG with RW 28 seconds    Status  Partially Met      PT LONG TERM GOAL #2   Title  increase Berg balance score to 40/56 for safety    Status  On-going      PT LONG TERM GOAL #3   Title  increase LE strength (particularly ABD and ext.) to 4/5 for functional gait and safety    Status  Partially Met      PT LONG TERM GOAL #4   Title  be able to do a sit to stand without assistance and without losing balance for functional use    Status  Partially Met            Plan - 11/14/19 1356    Clinical Impression Statement  Pt very hyper verbal throughout treatment session, Entire session completed without AD. Increase time to take a step after standing. When ambulating she tends to drag LLE, she can correct with cues but tends to stop and rever back to dragging LLE. She did  really well with  machine exercises on machine and in the supine position. LOB x3 with alt box taps min assist to correct.    Personal Factors and Comorbidities  Comorbidity 3+    Comorbidities  anxiety, CAD, CVA    Examination-Activity Limitations  Bathing;Stairs;Lift;Locomotion Level;Stand;Carry;Transfers    Examination-Participation Restrictions  Laundry;Shop;Cleaning    Stability/Clinical Decision Making  Evolving/Moderate complexity    Rehab Potential  Fair    PT Frequency  2x / week    PT Duration  8 weeks    PT Treatment/Interventions  ADLs/Self Care Home Management;Functional mobility training;Stair training;Gait training;Therapeutic activities;Therapeutic exercise;Balance training;Neuromuscular re-education;Manual techniques;Patient/family education     PT Next Visit Plan  push balance and mobility       Patient will benefit from skilled therapeutic intervention in order to improve the following deficits and impairments:  Abnormal gait, Postural dysfunction, Decreased mobility, Cardiopulmonary status limiting activity, Decreased activity tolerance, Decreased endurance, Decreased strength, Difficulty walking, Decreased balance  Visit Diagnosis: Difficulty in walking, not elsewhere  classified  Balance problem  Muscle weakness (generalized)     Problem List Patient Active Problem List   Diagnosis Date Noted  . Familial hypercholesteremia 02/21/2018  . Psychogenic gait 10/21/2017  . Coronary artery disease involving native coronary artery of native heart without angina pectoris 06/09/2017  . Abnormal nuclear cardiac imaging test 05/25/2017  . PVC's (premature ventricular contractions) 05/17/2017  . Anxiety 12/29/2016  . Vascular parkinsonism ([place]) 06/29/2016  . Gait disorder 04/07/2016  . Intracranial carotid stenosis, bilateral 02/27/2016  . Essential hypertension   . Left-sided weakness 12/28/2015  . Headache 12/28/2015  . Adjustment reaction with anxiety 12/20/2015  . Left hemiparesis ([place])   . History of CVA with residual deficit   . Prediabetes   . SVT (supraventricular tachycardia) ([place]) 12/14/2015  . Stroke-like symptom 12/13/2015  . History of stroke   . Heart palpitations 09/06/2012  . Hypertension   . Familial hyperlipidemia   . Hypothyroidism   . Vitamin D deficiency   . Palpitations   . History of dizziness   . LVH (left ventricular hypertrophy)   . Aortic stenosis   . Mitral regurgitation   . SOB (shortness of breath)   . Difficulty walking     Scot Jun, PTA 11/14/2019, 2:01 PM  [place], [place], [address] Phone: [phone]   Fax:  [phone]  Name: Stacey Ramirez MRN: [id number] Date of Birth:  08/20/1940

## 2019-11-16 ENCOUNTER — Other Ambulatory Visit: Payer: Self-pay

## 2019-11-16 ENCOUNTER — Ambulatory Visit: Payer: PPO | Admitting: Physical Therapy

## 2019-11-16 ENCOUNTER — Encounter: Payer: Self-pay | Admitting: Physical Therapy

## 2019-11-16 DIAGNOSIS — M6281 Muscle weakness (generalized): Secondary | ICD-10-CM | POA: Diagnosis not present

## 2019-11-16 DIAGNOSIS — R2689 Other abnormalities of gait and mobility: Secondary | ICD-10-CM

## 2019-11-16 DIAGNOSIS — R262 Difficulty in walking, not elsewhere classified: Secondary | ICD-10-CM

## 2019-11-16 DIAGNOSIS — R296 Repeated falls: Secondary | ICD-10-CM

## 2019-11-16 NOTE — Therapy (Signed)
[place], [place], [address] Phone: [phone]   Fax:  [phone]  Physical Therapy Treatment  Patient Details  Name: Stacey Ramirez MRN: [id number] Date of Birth: [birth year] Referring Provider (PT): Pharr   Encounter Date: 11/16/2019  PT End of Session - 11/16/19 1401    Visit Number  13    Date for PT Re-Evaluation  12/14/19    Authorization Type  [employer]    PT Start Time  1401    PT Stop Time  1441    PT Time Calculation (min)  40 min    Activity Tolerance  Patient limited by fatigue    Behavior During Therapy  [facility] for tasks assessed/performed       Past Medical History:  Diagnosis Date  . Adjustment reaction with anxiety 12/20/2015  . Aortic stenosis   . CAD (coronary artery disease) 06/09/2017   Nuc study 10/18: EF 69, inferolateral perfusion defect-possible small infarct with peri-infarct ischemia; intermediate risk // LHC 10/18: pLAD 25, pLCx 50, pRCA 25 >> med Rx  . Carotid stenosis, asymptomatic, bilateral 02/27/2016   Carotid US 5/17: Bilateral ICA 1-39  . Cerebrovascular accident (stroke) ([place])    a. 12/2015 - cryptogenic.  S/P MDT Linq.  . Difficulty walking   . Essential hypertension   . Gait disorder 04/07/2016  . Heart palpitations 09/06/2012  . History of CVA with residual deficit 12/14/2015  . History of dizziness   . History of echocardiogram    Echo 10/18: Vigorous LVF, EF 65-70, normal wall motion, grade 1 diastolic dysfunction, GLS -21.1%, mild RAE  . History of nuclear stress test    Nuclear stress test 10/18: EF 69, inf-lat defect c/w poss infarct with peri-infarct ischemia; Intermediate Risk  . Hyperlipidemia   . Hypothyroidism   . Left hemiparesis ([place])   . LVH (left ventricular hypertrophy)   . Mitral regurgitation    a. 12/2015 Echo: EF 60-65%, mild focal basal hypertrophy. No rwma, triv AI, mild MR, mildly dil LA w/o thrombus. No RA thrombus. No PFO.   Marland Kitchen Palpitations   . Prediabetes   . Stroke-like symptom 12/13/2015  . SVT (supraventricular tachycardia) ([place]) 12/14/2015  . Vascular parkinsonism ([place]) 06/29/2016  . Vitamin D deficiency     Past Surgical History:  Procedure Laterality Date  . CARDIOVASCULAR STRESS TEST  2002   NORMAL  . EP IMPLANTABLE DEVICE N/A 12/17/2015   Procedure: Loop Recorder Insertion;  Surgeon: [name], MD;  Location: [place] CV LAB;  Service: Cardiovascular;  Laterality: N/A;  . LEFT HEART CATH AND CORONARY ANGIOGRAPHY N/A 05/25/2017   Procedure: LEFT HEART CATH AND CORONARY ANGIOGRAPHY;  Surgeon: Sherren Mocha, MD;  Location: [place] CV LAB;  Service: Cardiovascular;  Laterality: N/A;  . LOOP RECORDER IMPLANT    . TEE WITHOUT CARDIOVERSION N/A 12/17/2015   Procedure: TRANSESOPHAGEAL ECHOCARDIOGRAM (TEE);  Surgeon: Lelon Perla, MD;  [facility] ENDOSCOPY;  Service: Cardiovascular;  Laterality: N/A;  Pt also needs a LOOP  . TRANSTHORACIC ECHOCARDIOGRAM  2008   SHOWED LEFT VENTRICULAR HYPERTROPHY AND MILD AORTIC STENOSIS    There were no vitals filed for this visit.  Subjective Assessment - 11/16/19 1401    Subjective  Pt reports that she has been working on her balance for a long time.    Patient Stated Goals  be stronger, walk better    Currently in Pain?  No/denies  [place] Adult PT Treatment/Exercise - 11/16/19 0001      Ambulation/Gait   Ambulation/Gait  No    Gait Comments  side stepping in the clinic pt attemptting to hold on to therapist needed cues to not hold on walking around clinic with close Supervision, VC to take big steps     Knee/Hip Exercises: Aerobic   Nustep  L6x7' arms and legs      Knee/Hip Exercises: Standing   Forward Step Up  10 reps;Step Height: 8";Both   with occassional min A d/t catching Lt toes      Knee/Hip Exercises: Seated   Long Arc Quad  Strengthening;Both;3 sets;10 reps    Long Arc Quad Weight  3 lbs.     Abduction/Adduction   Strengthening;3 sets;10 reps    Abd/Adduction Limitations  green band around knees    Sit to Sand  without UE support;10 reps               PT Short Term Goals - 11/14/19 1400      PT SHORT TERM GOAL #1   Title  independent with her home HEP    Status  Achieved        PT Long Term Goals - 11/14/19 1400      PT LONG TERM GOAL #1   Title  TUG with RW 28 seconds    Status  Partially Met      PT LONG TERM GOAL #2   Title  increase Berg balance score to 40/56 for safety    Status  On-going      PT LONG TERM GOAL #3   Title  increase LE strength (particularly ABD and ext.) to 4/5 for functional gait and safety    Status  Partially Met      PT LONG TERM GOAL #4   Title  be able to do a sit to stand without assistance and without losing balance for functional use    Status  Partially Met            Plan - 11/16/19 1430    Clinical Impression Statement  Pt talks throughout session, required a lot of encouragement to particpate in therapy, she stated "I dont' know if I can do that I am tired today" a lot during tx.  She needed cues to take bigger steps.    Rehab Potential  Fair    PT Frequency  2x / week    PT Duration  8 weeks    PT Treatment/Interventions  ADLs/Self Care Home Management;Functional mobility training;Stair training;Gait training;Therapeutic activities;Therapeutic exercise;Balance training;Neuromuscular re-education;Manual techniques;Patient/family education    PT Next Visit Plan  push balance and mobility    Consulted and Agree with Plan of Care  Patient       Patient will benefit from skilled therapeutic intervention in order to improve the following deficits and impairments:  Abnormal gait, Postural dysfunction, Decreased mobility, Cardiopulmonary status limiting activity, Decreased activity tolerance, Decreased endurance, Decreased strength, Difficulty walking, Decreased balance  Visit Diagnosis: Difficulty in walking, not  elsewhere classified  Balance problem  Muscle weakness (generalized)  Repeated falls     Problem List Patient Active Problem List   Diagnosis Date Noted  . Familial hypercholesteremia 02/21/2018  . Psychogenic gait 10/21/2017  . Coronary artery disease involving native coronary artery of native heart without angina pectoris 06/09/2017  . Abnormal nuclear cardiac imaging test 05/25/2017  . PVC's (premature ventricular contractions) 05/17/2017  . Anxiety 12/29/2016  . Vascular parkinsonism ([place]) 06/29/2016  .  Gait disorder 04/07/2016  . Intracranial carotid stenosis, bilateral 02/27/2016  . Essential hypertension   . Left-sided weakness 12/28/2015  . Headache 12/28/2015  . Adjustment reaction with anxiety 12/20/2015  . Left hemiparesis ([place])   . History of CVA with residual deficit   . Prediabetes   . SVT (supraventricular tachycardia) ([place]) 12/14/2015  . Stroke-like symptom 12/13/2015  . History of stroke   . Heart palpitations 09/06/2012  . Hypertension   . Familial hyperlipidemia   . Hypothyroidism   . Vitamin D deficiency   . Palpitations   . History of dizziness   . LVH (left ventricular hypertrophy)   . Aortic stenosis   . Mitral regurgitation   . SOB (shortness of breath)   . Difficulty walking     Jeral Pinch PT  11/16/2019, 2:47 PM  [place], [place], [address] Phone: [phone]   Fax:  [phone]  Name: DARLINE FAITH MRN: [id number] Date of Birth: 1941-04-25

## 2019-11-21 ENCOUNTER — Other Ambulatory Visit: Payer: Self-pay

## 2019-11-21 ENCOUNTER — Ambulatory Visit: Payer: PPO | Admitting: Physical Therapy

## 2019-11-21 ENCOUNTER — Encounter: Payer: Self-pay | Admitting: Physical Therapy

## 2019-11-21 DIAGNOSIS — M6281 Muscle weakness (generalized): Secondary | ICD-10-CM

## 2019-11-21 DIAGNOSIS — R2689 Other abnormalities of gait and mobility: Secondary | ICD-10-CM

## 2019-11-21 DIAGNOSIS — R262 Difficulty in walking, not elsewhere classified: Secondary | ICD-10-CM

## 2019-11-21 NOTE — Therapy (Signed)
[place], [place], [address] Phone: [phone]   Fax:  [phone]  Physical Therapy Treatment  Patient Details  Name: Stacey Ramirez MRN: [id number] Date of Birth: 02-22-1941 Referring Provider (PT): Shelia Media   Encounter Date: 11/21/2019  PT End of Session - 11/21/19 1515    Visit Number  14    Date for PT Re-Evaluation  12/14/19    Authorization Type  [employer]    PT Start Time  1430    PT Stop Time  1515    PT Time Calculation (min)  45 min    Activity Tolerance  Patient limited by fatigue       Past Medical History:  Diagnosis Date  . Adjustment reaction with anxiety 12/20/2015  . Aortic stenosis   . CAD (coronary artery disease) 06/09/2017   Nuc study 10/18: EF 69, inferolateral perfusion defect-possible small infarct with peri-infarct ischemia; intermediate risk // LHC 10/18: pLAD 25, pLCx 50, pRCA 25 >> med Rx  . Carotid stenosis, asymptomatic, bilateral 02/27/2016   Carotid US 5/17: Bilateral ICA 1-39  . Cerebrovascular accident (stroke) ([place])    a. 12/2015 - cryptogenic.  S/P MDT Linq.  . Difficulty walking   . Essential hypertension   . Gait disorder 04/07/2016  . Heart palpitations 09/06/2012  . History of CVA with residual deficit 12/14/2015  . History of dizziness   . History of echocardiogram    Echo 10/18: Vigorous LVF, EF 65-70, normal wall motion, grade 1 diastolic dysfunction, GLS -21.1%, mild RAE  . History of nuclear stress test    Nuclear stress test 10/18: EF 69, inf-lat defect c/w poss infarct with peri-infarct ischemia; Intermediate Risk  . Hyperlipidemia   . Hypothyroidism   . Left hemiparesis ([place])   . LVH (left ventricular hypertrophy)   . Mitral regurgitation    a. 12/2015 Echo: EF 60-65%, mild focal basal hypertrophy. No rwma, triv AI, mild MR, mildly dil LA w/o thrombus. No RA thrombus. No PFO.  Marland Kitchen Palpitations   . Prediabetes   . Stroke-like symptom  12/13/2015  . SVT (supraventricular tachycardia) ([place]) 12/14/2015  . Vascular parkinsonism ([place]) 06/29/2016  . Vitamin D deficiency     Past Surgical History:  Procedure Laterality Date  . CARDIOVASCULAR STRESS TEST  2002   NORMAL  . EP IMPLANTABLE DEVICE N/A 12/17/2015   Procedure: Loop Recorder Insertion;  Surgeon: [name], MD;  Location: [place] CV LAB;  Service: Cardiovascular;  Laterality: N/A;  . LEFT HEART CATH AND CORONARY ANGIOGRAPHY N/A 05/25/2017   Procedure: LEFT HEART CATH AND CORONARY ANGIOGRAPHY;  Surgeon: Sherren Mocha, MD;  Location: [place] CV LAB;  Service: Cardiovascular;  Laterality: N/A;  . LOOP RECORDER IMPLANT    . TEE WITHOUT CARDIOVERSION N/A 12/17/2015   Procedure: TRANSESOPHAGEAL ECHOCARDIOGRAM (TEE);  Surgeon: Lelon Perla, MD;  Location: [facility] ENDOSCOPY;  Service: Cardiovascular;  Laterality: N/A;  Pt also needs a LOOP  . TRANSTHORACIC ECHOCARDIOGRAM  2008   SHOWED LEFT VENTRICULAR HYPERTROPHY AND MILD AORTIC STENOSIS    There were no vitals filed for this visit.  Subjective Assessment - 11/21/19 1435    Subjective  "Well I am very very tired"    Currently in Pain?  No/denies         [facility] PT Assessment - 11/21/19 0001      Strength   Left Hip Flexion  4/5    Left Hip Extension  4/5  Left Hip ABduction  4/5      Timed Up and Go Test   Normal TUG (seconds)  29                   [facility] Adult PT Treatment/Exercise - 11/21/19 0001      High Level Balance   High Level Balance Activities  Side stepping      Knee/Hip Exercises: Aerobic   Nustep  L5 x7' arms and legs      Knee/Hip Exercises: Machines for Strengthening   Cybex Knee Extension  10lb 2x15, LLE 5lb 2x10     Cybex Knee Flexion  25lb 2x15, LLE 10lb x15       Knee/Hip Exercises: Standing   Other Standing Knee Exercises  4 in box taps 3lb cuff with RW 2x10               PT Short Term Goals - 11/14/19 1400      PT SHORT TERM GOAL #1   Title   independent with her home HEP    Status  Achieved        PT Long Term Goals - 11/21/19 1450      PT LONG TERM GOAL #3   Title  increase LE strength (particularly ABD and ext.) to 4/5 for functional gait and safety    Status  Achieved      PT LONG TERM GOAL #4   Title  be able to do a sit to stand without assistance and without losing balance for functional use    Status  Partially Met            Plan - 11/21/19 1516    Clinical Impression Statement  Progressed decreasing her TUG time. He has progressed increasing her LE strength. Pt continues to need increase tie to initiate gait after standing. Difficulty taking LLE off box with alt taps.    Personal Factors and Comorbidities  Comorbidity 3+    Comorbidities  anxiety, CAD, CVA    Examination-Activity Limitations  Bathing;Stairs;Lift;Locomotion Level;Stand;Carry;Transfers    Examination-Participation Restrictions  Laundry;Shop;Cleaning    Stability/Clinical Decision Making  Evolving/Moderate complexity    Rehab Potential  Fair    PT Frequency  2x / week    PT Treatment/Interventions  ADLs/Self Care Home Management;Functional mobility training;Stair training;Gait training;Therapeutic activities;Therapeutic exercise;Balance training;Neuromuscular re-education;Manual techniques;Patient/family education    PT Next Visit Plan  push balance and mobility       Patient will benefit from skilled therapeutic intervention in order to improve the following deficits and impairments:  Abnormal gait, Postural dysfunction, Decreased mobility, Cardiopulmonary status limiting activity, Decreased activity tolerance, Decreased endurance, Decreased strength, Difficulty walking, Decreased balance  Visit Diagnosis: Balance problem  Difficulty in walking, not elsewhere classified  Muscle weakness (generalized)     Problem List Patient Active Problem List   Diagnosis Date Noted  . Familial hypercholesteremia 02/21/2018  . Psychogenic gait  10/21/2017  . Coronary artery disease involving native coronary artery of native heart without angina pectoris 06/09/2017  . Abnormal nuclear cardiac imaging test 05/25/2017  . PVC's (premature ventricular contractions) 05/17/2017  . Anxiety 12/29/2016  . Vascular parkinsonism ([place]) 06/29/2016  . Gait disorder 04/07/2016  . Intracranial carotid stenosis, bilateral 02/27/2016  . Essential hypertension   . Left-sided weakness 12/28/2015  . Headache 12/28/2015  . Adjustment reaction with anxiety 12/20/2015  . Left hemiparesis ([place])   . History of CVA with residual deficit   . Prediabetes   . SVT (supraventricular tachycardia) (  [place]) 12/14/2015  . Stroke-like symptom 12/13/2015  . History of stroke   . Heart palpitations 09/06/2012  . Hypertension   . Familial hyperlipidemia   . Hypothyroidism   . Vitamin D deficiency   . Palpitations   . History of dizziness   . LVH (left ventricular hypertrophy)   . Aortic stenosis   . Mitral regurgitation   . SOB (shortness of breath)   . Difficulty walking     Scot Jun, PTA 11/21/2019, 3:22 PM  [place], [place], [address] Phone: [phone]   Fax:  [phone]  Name: Stacey Ramirez MRN: [id number] Date of Birth: Sep 08, 1940

## 2019-11-23 ENCOUNTER — Ambulatory Visit: Payer: PPO | Admitting: Physical Therapy

## 2019-11-28 ENCOUNTER — Other Ambulatory Visit: Payer: Self-pay

## 2019-11-28 ENCOUNTER — Ambulatory Visit: Payer: PPO | Admitting: Physical Therapy

## 2019-11-28 DIAGNOSIS — R2689 Other abnormalities of gait and mobility: Secondary | ICD-10-CM

## 2019-11-28 DIAGNOSIS — M6281 Muscle weakness (generalized): Secondary | ICD-10-CM | POA: Diagnosis not present

## 2019-11-28 DIAGNOSIS — R262 Difficulty in walking, not elsewhere classified: Secondary | ICD-10-CM

## 2019-11-28 NOTE — Therapy (Signed)
[place], [place], [address] Phone: [phone]   Fax:  [phone]  Physical Therapy Treatment  Patient Details  Name: Stacey Ramirez MRN: [id number] Date of Birth: 08-May-1941 Referring Provider (PT): Pharr   Encounter Date: 11/28/2019  PT End of Session - 11/28/19 1441    Visit Number  15    Date for PT Re-Evaluation  12/14/19    PT Start Time  1400    PT Stop Time  1452    PT Time Calculation (min)  52 min       Past Medical History:  Diagnosis Date  . Adjustment reaction with anxiety 12/20/2015  . Aortic stenosis   . CAD (coronary artery disease) 06/09/2017   Nuc study 10/18: EF 69, inferolateral perfusion defect-possible small infarct with peri-infarct ischemia; intermediate risk // LHC 10/18: pLAD 25, pLCx 50, pRCA 25 >> med Rx  . Carotid stenosis, asymptomatic, bilateral 02/27/2016   Carotid US 5/17: Bilateral ICA 1-39  . Cerebrovascular accident (stroke) ([place])    a. 12/2015 - cryptogenic.  S/P MDT Linq.  . Difficulty walking   . Essential hypertension   . Gait disorder 04/07/2016  . Heart palpitations 09/06/2012  . History of CVA with residual deficit 12/14/2015  . History of dizziness   . History of echocardiogram    Echo 10/18: Vigorous LVF, EF 65-70, normal wall motion, grade 1 diastolic dysfunction, GLS -21.1%, mild RAE  . History of nuclear stress test    Nuclear stress test 10/18: EF 69, inf-lat defect c/w poss infarct with peri-infarct ischemia; Intermediate Risk  . Hyperlipidemia   . Hypothyroidism   . Left hemiparesis ([place])   . LVH (left ventricular hypertrophy)   . Mitral regurgitation    a. 12/2015 Echo: EF 60-65%, mild focal basal hypertrophy. No rwma, triv AI, mild MR, mildly dil LA w/o thrombus. No RA thrombus. No PFO.  Marland Kitchen Palpitations   . Prediabetes   . Stroke-like symptom 12/13/2015  . SVT (supraventricular tachycardia) ([place] ) 12/14/2015  . Vascular parkinsonism ([place])  06/29/2016  . Vitamin D deficiency     Past Surgical History:  Procedure Laterality Date  . CARDIOVASCULAR STRESS TEST  2002   NORMAL  . EP IMPLANTABLE DEVICE N/A 12/17/2015   Procedure: Loop Recorder Insertion;  Surgeon: [name], MD;  Location: [place] [facility];  Service: Cardiovascular;  Laterality: N/A;  . LEFT HEART CATH AND CORONARY ANGIOGRAPHY N/A 05/25/2017   Procedure: LEFT HEART CATH AND CORONARY ANGIOGRAPHY;  Surgeon: Sherren Mocha, MD;  Location: [place] [facility];  Service: Cardiovascular;  Laterality: N/A;  . LOOP RECORDER IMPLANT    . TEE WITHOUT CARDIOVERSION N/A 12/17/2015   Procedure: TRANSESOPHAGEAL ECHOCARDIOGRAM (TEE);  Surgeon: Lelon Perla, MD;  Location: [facility] ENDOSCOPY;  Service: Cardiovascular;  Laterality: N/A;  Pt also needs a LOOP  . TRANSTHORACIC ECHOCARDIOGRAM  2008   SHOWED LEFT VENTRICULAR HYPERTROPHY AND MILD AORTIC STENOSIS    There were no vitals filed for this visit.  Subjective Assessment - 11/28/19 1405    Subjective  " I was bad last week, increased leg paina dn could hardly walk- that is why I had to cancel"    Currently in Pain?  No/denies                       [facility] Adult PT Treatment/Exercise - 11/28/19 0001      Berg Balance Test   Sit to  Stand  Able to stand  independently using hands    Standing Unsupported  Able to stand safely 2 minutes    Sitting with Back Unsupported but Feet Supported on Floor or Stool  Able to sit safely and securely 2 minutes    Stand to Sit  Controls descent by using hands    Transfers  Able to transfer safely, definite need of hands    Standing Unsupported with Eyes Closed  Able to stand 10 seconds with supervision    Standing Ubsupported with Feet Together  Needs help to attain position but able to stand for 30 seconds with feet together    From Standing, Reach Forward with Outstretched Arm  Can reach forward >12 cm safely (5")    From Standing Position, Pick up Object from Floor   Unable to pick up shoe, but reaches 2-5 cm (1-2") from shoe and balances independently    From Standing Position, Turn to Look Behind Over each Shoulder  Looks behind from both sides and weight shifts well    Turn 360 Degrees  Able to turn 360 degrees safely but slowly    Standing Unsupported, Alternately Place Feet on Step/Stool  Able to complete >2 steps/needs minimal assist    Standing Unsupported, One Foot in [employer] balance while stepping or standing    Standing on One Leg  Unable to try or needs assist to prevent fall    Total Score  33      High Level Balance   High Level Balance Activities  Marching forwards;Backward walking;Side stepping   HHA 3 # 15 feet 1 time each   High Level Balance Comments  6 inch 3# HHA alt LE step tap 20- mod A with increased cuing   decreased ability to move left foot and LOB backwards     Knee/Hip Exercises: Aerobic   Nustep  L 5 6 min      Knee/Hip Exercises: Machines for Strengthening   Cybex Knee Extension  10lb 2x10, LLE 5lb 10x    Cybex Knee Flexion  25lb 2x15, LLE 10lb x15     Total Gym Leg Press  20# 3 sets 10      Knee/Hip Exercises: Standing   Hip Flexion  Stengthening;20 reps;Knee straight   3# HHA alt min A   Hip Abduction  Stengthening;Knee straight;20 reps   3# HHA alt   Hip Extension  Stengthening;Both;20 reps;Knee straight   3# HHA alt              PT Short Term Goals - 11/14/19 1400      PT SHORT TERM GOAL #1   Title  independent with her home HEP    Status  Achieved        PT Long Term Goals - 11/28/19 1410      PT LONG TERM GOAL #1   Title  TUG with RW 28 seconds    Baseline  37.6 today with cuing to not stop, slow with turns    Status  Partially Met      PT LONG TERM GOAL #2   Title  increase Berg balance score to 40/56 for safety    Baseline  33/56    Status  Partially Met      PT LONG TERM GOAL #3   Title  increase LE strength (particularly ABD and ext.) to 4/5 for functional gait and safety     Baseline  grossly tested 4/5    Status  Achieved  PT LONG TERM GOAL #4   Title  be able to do a sit to stand without assistance and without losing balance for functional use    Status  Partially Met            Plan - 11/28/19 1441    Clinical Impression Statement  pt missed a session last week verb increased leg pain and decreased walking. BERG decreased 1 pt from last month and TUG increased- but needs alot of redirecting. pt struggled through session today with fatigue,LOB and noted left LE weakness.    PT Treatment/Interventions  ADLs/Self Care Home Management;Functional mobility training;Stair training;Gait training;Therapeutic activities;Therapeutic exercise;Balance training;Neuromuscular re-education;Manual techniques;Patient/family education    PT Next Visit Plan  push balance and mobility       Patient will benefit from skilled therapeutic intervention in order to improve the following deficits and impairments:  Abnormal gait, Postural dysfunction, Decreased mobility, Cardiopulmonary status limiting activity, Decreased activity tolerance, Decreased endurance, Decreased strength, Difficulty walking, Decreased balance  Visit Diagnosis: Balance problem  Difficulty in walking, not elsewhere classified  Muscle weakness (generalized)     Problem List Patient Active Problem List   Diagnosis Date Noted  . Familial hypercholesteremia 02/21/2018  . Psychogenic gait 10/21/2017  . Coronary artery disease involving native coronary artery of native heart without angina pectoris 06/09/2017  . Abnormal nuclear cardiac imaging test 05/25/2017  . PVC's (premature ventricular contractions) 05/17/2017  . Anxiety 12/29/2016  . Vascular parkinsonism ([place]) 06/29/2016  . Gait disorder 04/07/2016  . Intracranial carotid stenosis, bilateral 02/27/2016  . Essential hypertension   . Left-sided weakness 12/28/2015  . Headache 12/28/2015  . Adjustment reaction with anxiety 12/20/2015   . Left hemiparesis ([place])   . History of CVA with residual deficit   . Prediabetes   . SVT (supraventricular tachycardia) ([place]) 12/14/2015  . Stroke-like symptom 12/13/2015  . History of stroke   . Heart palpitations 09/06/2012  . Hypertension   . Familial hyperlipidemia   . Hypothyroidism   . Vitamin D deficiency   . Palpitations   . History of dizziness   . LVH (left ventricular hypertrophy)   . Aortic stenosis   . Mitral regurgitation   . SOB (shortness of breath)   . Difficulty walking     Carl Bleecker,Stacey Ramirez PTA 11/28/2019, 2:44 PM  [place], [place], [address] Phone: [phone]   Fax:  [phone]  Name: JOSETTE SHIMABUKURO MRN: [id number] Date of Birth: 1941/06/17

## 2019-11-29 DIAGNOSIS — E7800 Pure hypercholesterolemia, unspecified: Secondary | ICD-10-CM | POA: Diagnosis not present

## 2019-11-29 DIAGNOSIS — Z7982 Long term (current) use of aspirin: Secondary | ICD-10-CM | POA: Diagnosis not present

## 2019-11-29 DIAGNOSIS — M810 Age-related osteoporosis without current pathological fracture: Secondary | ICD-10-CM | POA: Diagnosis not present

## 2019-11-29 DIAGNOSIS — I10 Essential (primary) hypertension: Secondary | ICD-10-CM | POA: Diagnosis not present

## 2019-11-29 DIAGNOSIS — E785 Hyperlipidemia, unspecified: Secondary | ICD-10-CM | POA: Diagnosis not present

## 2019-11-30 ENCOUNTER — Ambulatory Visit: Payer: PPO | Admitting: Physical Therapy

## 2019-11-30 ENCOUNTER — Other Ambulatory Visit: Payer: Self-pay

## 2019-11-30 DIAGNOSIS — R262 Difficulty in walking, not elsewhere classified: Secondary | ICD-10-CM

## 2019-11-30 DIAGNOSIS — M6281 Muscle weakness (generalized): Secondary | ICD-10-CM

## 2019-11-30 DIAGNOSIS — R2689 Other abnormalities of gait and mobility: Secondary | ICD-10-CM

## 2019-11-30 NOTE — Therapy (Signed)
[place], [place], [address] Phone: [phone]   Fax:  [phone]  Physical Therapy Treatment  Patient Details  Name: Stacey Ramirez MRN: [id number] Date of Birth: 04-26-1941 Referring Provider (PT): Pharr   Encounter Date: 11/30/2019  PT End of Session - 11/30/19 1144    Visit Number  16    Date for PT Re-Evaluation  12/14/19    PT Start Time  1100    PT Stop Time  1145    PT Time Calculation (min)  45 min       Past Medical History:  Diagnosis Date  . Adjustment reaction with anxiety 12/20/2015  . Aortic stenosis   . CAD (coronary artery disease) 06/09/2017   Nuc study 10/18: EF 69, inferolateral perfusion defect-possible small infarct with peri-infarct ischemia; intermediate risk // LHC 10/18: pLAD 25, pLCx 50, pRCA 25 >> med Rx  . Carotid stenosis, asymptomatic, bilateral 02/27/2016   Carotid US 5/17: Bilateral ICA 1-39  . Cerebrovascular accident (stroke) ([place])    a. 12/2015 - cryptogenic.  S/P MDT Linq.  . Difficulty walking   . Essential hypertension   . Gait disorder 04/07/2016  . Heart palpitations 09/06/2012  . History of CVA with residual deficit 12/14/2015  . History of dizziness   . History of echocardiogram    Echo 10/18: Vigorous LVF, EF 65-70, normal wall motion, grade 1 diastolic dysfunction, GLS -21.1%, mild RAE  . History of nuclear stress test    Nuclear stress test 10/18: EF 69, inf-lat defect c/w poss infarct with peri-infarct ischemia; Intermediate Risk  . Hyperlipidemia   . Hypothyroidism   . Left hemiparesis ([place])   . LVH (left ventricular hypertrophy)   . Mitral regurgitation    a. 12/2015 Echo: EF 60-65%, mild focal basal hypertrophy. No rwma, triv AI, mild MR, mildly dil LA w/o thrombus. No RA thrombus. No PFO.  Marland Kitchen Palpitations   . Prediabetes   . Stroke-like symptom 12/13/2015  . SVT (supraventricular tachycardia) ([place]) 12/14/2015  . Vascular parkinsonism ([place])  06/29/2016  . Vitamin D deficiency     Past Surgical History:  Procedure Laterality Date  . CARDIOVASCULAR STRESS TEST  2002   NORMAL  . EP IMPLANTABLE DEVICE N/A 12/17/2015   Procedure: Loop Recorder Insertion;  Surgeon: [name], MD;  Location: [place] [facility];  Service: Cardiovascular;  Laterality: N/A;  . LEFT HEART CATH AND CORONARY ANGIOGRAPHY N/A 05/25/2017   Procedure: LEFT HEART CATH AND CORONARY ANGIOGRAPHY;  Surgeon: Sherren Mocha, MD;  Location: [place] [facility];  Service: Cardiovascular;  Laterality: N/A;  . LOOP RECORDER IMPLANT    . TEE WITHOUT CARDIOVERSION N/A 12/17/2015   Procedure: TRANSESOPHAGEAL ECHOCARDIOGRAM (TEE);  Surgeon: Lelon Perla, MD;  Location: [facility] ENDOSCOPY;  Service: Cardiovascular;  Laterality: N/A;  Pt also needs a LOOP  . TRANSTHORACIC ECHOCARDIOGRAM  2008   SHOWED LEFT VENTRICULAR HYPERTROPHY AND MILD AORTIC STENOSIS    There were no vitals filed for this visit.  Subjective Assessment - 11/30/19 1102    Subjective  "woke up with my legs hurting alittle but I am here and I am trying"    Currently in Pain?  Yes    Pain Score  2     Pain Location  Leg    Pain Orientation  Right;Left                       [facility] Adult  PT Treatment/Exercise - 11/30/19 0001      Ambulation/Gait   Gait Comments  HHA gait to increase stride and cadeance 150 feet 2 times- min A with multi LOB to left and left foot catching, max cuing needed       Knee/Hip Exercises: Aerobic   Nustep  L 5 6 min    Other Aerobic  UBE L2 40mn fwd/2 min back      Knee/Hip Exercises: Standing   Other Standing Knee Exercises  HHA alt LE 20 min A with cuing hip flex,ext,abd and marching- difficulty with LEft LE    Other Standing Knee Exercises  HHA stepping on/off and sideways      Knee/Hip Exercises: Seated   Long Arc Quad  Strengthening;Both;15 reps   grren tband   Marching  Strengthening;Both;15 reps   green tband   Hamstring Curl   Strengthening;Both;15 reps   green tband   Abduction/Adduction   Strengthening;20 reps    Abd/Adduction Limitations  green band around knees    Sit to Sand  without UE support;2 sets;10 reps   from elevated UBE seat. 2 different height CG A              PT Short Term Goals - 11/14/19 1400      PT SHORT TERM GOAL #1   Title  independent with her home HEP    Status  Achieved        PT Long Term Goals - 11/28/19 1410      PT LONG TERM GOAL #1   Title  TUG with RW 28 seconds    Baseline  37.6 today with cuing to not stop, slow with turns    Status  Partially Met      PT LONG TERM GOAL #2   Title  increase Berg balance score to 40/56 for safety    Baseline  33/56    Status  Partially Met      PT LONG TERM GOAL #3   Title  increase LE strength (particularly ABD and ext.) to 4/5 for functional gait and safety    Baseline  grossly tested 4/5    Status  Achieved      PT LONG TERM GOAL #4   Title  be able to do a sit to stand without assistance and without losing balance for functional use    Status  Partially Met            Plan - 11/30/19 1145    Clinical Impression Statement  focus session on LE strength and  mvmt of LE esp Left- assitance and cuing needed . LOB with left Left LE stepping as well as LOB to left with HHA gait. pt does do better with constant cuing    PT Treatment/Interventions  ADLs/Self Care Home Management;Functional mobility training;Stair training;Gait training;Therapeutic activities;Therapeutic exercise;Balance training;Neuromuscular re-education;Manual techniques;Patient/family education    PT Next Visit Plan  push balance and mobility       Patient will benefit from skilled therapeutic intervention in order to improve the following deficits and impairments:  Abnormal gait, Postural dysfunction, Decreased mobility, Cardiopulmonary status limiting activity, Decreased activity tolerance, Decreased endurance, Decreased strength, Difficulty  walking, Decreased balance  Visit Diagnosis: Balance problem  Difficulty in walking, not elsewhere classified  Muscle weakness (generalized)     Problem List Patient Active Problem List   Diagnosis Date Noted  . Familial hypercholesteremia 02/21/2018  . Psychogenic gait 10/21/2017  . Coronary artery disease involving native coronary artery of  native heart without angina pectoris 06/09/2017  . Abnormal nuclear cardiac imaging test 05/25/2017  . PVC's (premature ventricular contractions) 05/17/2017  . Anxiety 12/29/2016  . Vascular parkinsonism ([place]) 06/29/2016  . Gait disorder 04/07/2016  . Intracranial carotid stenosis, bilateral 02/27/2016  . Essential hypertension   . Left-sided weakness 12/28/2015  . Headache 12/28/2015  . Adjustment reaction with anxiety 12/20/2015  . Left hemiparesis ([place])   . History of CVA with residual deficit   . Prediabetes   . SVT (supraventricular tachycardia) ([place]) 12/14/2015  . Stroke-like symptom 12/13/2015  . History of stroke   . Heart palpitations 09/06/2012  . Hypertension   . Familial hyperlipidemia   . Hypothyroidism   . Vitamin D deficiency   . Palpitations   . History of dizziness   . LVH (left ventricular hypertrophy)   . Aortic stenosis   . Mitral regurgitation   . SOB (shortness of breath)   . Difficulty walking     Keishia Ground,ANGIE PTA 11/30/2019, 11:47 AM  [place], [place], [address] Phone: [phone]   Fax:  [phone]  Name: Stacey Ramirez MRN: [id number] Date of Birth: 1941-06-14

## 2019-12-04 DIAGNOSIS — E039 Hypothyroidism, unspecified: Secondary | ICD-10-CM | POA: Diagnosis not present

## 2019-12-04 DIAGNOSIS — Z7901 Long term (current) use of anticoagulants: Secondary | ICD-10-CM | POA: Diagnosis not present

## 2019-12-04 DIAGNOSIS — Z0000 Encounter for general adult medical examination without abnormal findings: Secondary | ICD-10-CM | POA: Diagnosis not present

## 2019-12-04 DIAGNOSIS — E7800 Pure hypercholesterolemia, unspecified: Secondary | ICD-10-CM | POA: Diagnosis not present

## 2019-12-04 DIAGNOSIS — I639 Cerebral infarction, unspecified: Secondary | ICD-10-CM | POA: Diagnosis not present

## 2019-12-04 DIAGNOSIS — I10 Essential (primary) hypertension: Secondary | ICD-10-CM | POA: Diagnosis not present

## 2019-12-05 DIAGNOSIS — N3941 Urge incontinence: Secondary | ICD-10-CM | POA: Diagnosis not present

## 2019-12-05 DIAGNOSIS — N390 Urinary tract infection, site not specified: Secondary | ICD-10-CM | POA: Diagnosis not present

## 2019-12-05 DIAGNOSIS — R8290 Unspecified abnormal findings in urine: Secondary | ICD-10-CM | POA: Diagnosis not present

## 2019-12-06 ENCOUNTER — Ambulatory Visit: Payer: PPO | Attending: Internal Medicine | Admitting: Physical Therapy

## 2019-12-06 ENCOUNTER — Other Ambulatory Visit: Payer: Self-pay

## 2019-12-06 ENCOUNTER — Encounter: Payer: Self-pay | Admitting: Physical Therapy

## 2019-12-06 DIAGNOSIS — M62830 Muscle spasm of back: Secondary | ICD-10-CM | POA: Diagnosis not present

## 2019-12-06 DIAGNOSIS — G8194 Hemiplegia, unspecified affecting left nondominant side: Secondary | ICD-10-CM | POA: Insufficient documentation

## 2019-12-06 DIAGNOSIS — R296 Repeated falls: Secondary | ICD-10-CM | POA: Insufficient documentation

## 2019-12-06 DIAGNOSIS — G20 Parkinson's disease: Secondary | ICD-10-CM | POA: Insufficient documentation

## 2019-12-06 DIAGNOSIS — R2689 Other abnormalities of gait and mobility: Secondary | ICD-10-CM | POA: Diagnosis not present

## 2019-12-06 DIAGNOSIS — M545 Low back pain: Secondary | ICD-10-CM | POA: Diagnosis not present

## 2019-12-06 DIAGNOSIS — F419 Anxiety disorder, unspecified: Secondary | ICD-10-CM | POA: Insufficient documentation

## 2019-12-06 DIAGNOSIS — M6281 Muscle weakness (generalized): Secondary | ICD-10-CM | POA: Diagnosis not present

## 2019-12-06 DIAGNOSIS — I63411 Cerebral infarction due to embolism of right middle cerebral artery: Secondary | ICD-10-CM | POA: Diagnosis not present

## 2019-12-06 DIAGNOSIS — R262 Difficulty in walking, not elsewhere classified: Secondary | ICD-10-CM | POA: Diagnosis not present

## 2019-12-06 NOTE — Therapy (Signed)
[place], [place], [address] Phone: [phone]   Fax:  [phone]  Physical Therapy Treatment  Patient Details  Name: Stacey Ramirez MRN: [id number] Date of Birth: 1941/06/17 Referring Provider (PT): Shelia Media   Encounter Date: 12/06/2019  PT End of Session - 12/06/19 1228    Visit Number  17    Date for PT Re-Evaluation  12/14/19    Authorization Type  [employer]    PT Start Time  5035    PT Stop Time  1230    PT Time Calculation (min)  45 min    Activity Tolerance  Patient limited by fatigue    Behavior During Therapy  [facility] for tasks assessed/performed       Past Medical History:  Diagnosis Date  . Adjustment reaction with anxiety 12/20/2015  . Aortic stenosis   . CAD (coronary artery disease) 06/09/2017   Nuc study 10/18: EF 69, inferolateral perfusion defect-possible small infarct with peri-infarct ischemia; intermediate risk // LHC 10/18: pLAD 25, pLCx 50, pRCA 25 >> med Rx  . Carotid stenosis, asymptomatic, bilateral 02/27/2016   Carotid US 5/17: Bilateral ICA 1-39  . Cerebrovascular accident (stroke) ([place])    a. 12/2015 - cryptogenic.  S/P MDT Linq.  . Difficulty walking   . Essential hypertension   . Gait disorder 04/07/2016  . Heart palpitations 09/06/2012  . History of CVA with residual deficit 12/14/2015  . History of dizziness   . History of echocardiogram    Echo 10/18: Vigorous LVF, EF 65-70, normal wall motion, grade 1 diastolic dysfunction, GLS -21.1%, mild RAE  . History of nuclear stress test    Nuclear stress test 10/18: EF 69, inf-lat defect c/w poss infarct with peri-infarct ischemia; Intermediate Risk  . Hyperlipidemia   . Hypothyroidism   . Left hemiparesis ([place])   . LVH (left ventricular hypertrophy)   . Mitral regurgitation    a. 12/2015 Echo: EF 60-65%, mild focal basal hypertrophy. No rwma, triv AI, mild MR, mildly dil LA w/o thrombus. No RA thrombus. No PFO.   Marland Kitchen Palpitations   . Prediabetes   . Stroke-like symptom 12/13/2015  . SVT (supraventricular tachycardia) ([place]) 12/14/2015  . Vascular parkinsonism ([place]) 06/29/2016  . Vitamin D deficiency     Past Surgical History:  Procedure Laterality Date  . CARDIOVASCULAR STRESS TEST  2002   NORMAL  . EP IMPLANTABLE DEVICE N/A 12/17/2015   Procedure: Loop Recorder Insertion;  Surgeon: [name], MD;  Location: [place] [facility] LAB;  Service: Cardiovascular;  Laterality: N/A;  . LEFT HEART CATH AND CORONARY ANGIOGRAPHY N/A 05/25/2017   Procedure: LEFT HEART CATH AND CORONARY ANGIOGRAPHY;  Surgeon: Sherren Mocha, MD;  Location: [place] [facility];  Service: Cardiovascular;  Laterality: N/A;  . LOOP RECORDER IMPLANT    . TEE WITHOUT CARDIOVERSION N/A 12/17/2015   Procedure: TRANSESOPHAGEAL ECHOCARDIOGRAM (TEE);  Surgeon: Lelon Perla, MD;  Location: [facility] ENDOSCOPY;  Service: Cardiovascular;  Laterality: N/A;  Pt also needs a LOOP  . TRANSTHORACIC ECHOCARDIOGRAM  2008   SHOWED LEFT VENTRICULAR HYPERTROPHY AND MILD AORTIC STENOSIS    There were no vitals filed for this visit.  Subjective Assessment - 12/06/19 1201    Subjective  "I wish I could walk better"    Currently in Pain?  No/denies                       [facility] Adult PT  Treatment/Exercise - 12/06/19 0001      Ambulation/Gait   Ambulation/Gait  Yes    Ambulation/Gait Assistance  4: Min guard    Ambulation Distance (Feet)  60 Feet    Assistive device  None    Gait Pattern  Step-to pattern;Step-through pattern;Decreased arm swing - right;Decreased arm swing - left;Decreased step length - left;Decreased stride length;Decreased hip/knee flexion - left    Ambulation Surface  Level;Indoor    Gait velocity  slow    Gait Comments  cue sot increase step length with LLE      High Level Balance   High Level Balance Activities  Side stepping;Negotiating over obstacles;Marching forwards    High Level Balance Comments  ball toss  and kicks, min assist with kicks, Stepping over Tband an foam roll        Knee/Hip Exercises: Aerobic   Tread Mill  1 MPH  1 incline x 2 min    Nustep  L 5 6 min      Knee/Hip Exercises: Standing   Other Standing Knee Exercises  Alt 2 in box taps no assist 2x10 each , some LOB                PT Short Term Goals - 11/14/19 1400      PT SHORT TERM GOAL #1   Title  independent with her home HEP    Status  Achieved        PT Long Term Goals - 11/28/19 1410      PT LONG TERM GOAL #1   Title  TUG with RW 28 seconds    Baseline  37.6 today with cuing to not stop, slow with turns    Status  Partially Met      PT LONG TERM GOAL #2   Title  increase Berg balance score to 40/56 for safety    Baseline  33/56    Status  Partially Met      PT LONG TERM GOAL #3   Title  increase LE strength (particularly ABD and ext.) to 4/5 for functional gait and safety    Baseline  grossly tested 4/5    Status  Achieved      PT LONG TERM GOAL #4   Title  be able to do a sit to stand without assistance and without losing balance for functional use    Status  Partially Met            Plan - 12/06/19 1229    Clinical Impression Statement  Session focused on balance without assist. Increase time needed after standing to initiate gait. Cues needed to keep hips square with side steps. Pt tends to drag LLE requiring constant cues throughout session to pick up her LLE. Some LOB with box taps needing assit to correct.    Personal Factors and Comorbidities  Comorbidity 3+    Comorbidities  anxiety, CAD, CVA    Examination-Activity Limitations  Bathing;Stairs;Lift;Locomotion Level;Stand;Carry;Transfers    Examination-Participation Restrictions  Laundry;Shop;Cleaning    Stability/Clinical Decision Making  Evolving/Moderate complexity    Rehab Potential  Fair    PT Frequency  2x / week    PT Treatment/Interventions  ADLs/Self Care Home Management;Functional mobility training;Stair  training;Gait training;Therapeutic activities;Therapeutic exercise;Balance training;Neuromuscular re-education;Manual techniques;Patient/family education    PT Next Visit Plan  push balance and mobility       Patient will benefit from skilled therapeutic intervention in order to improve the following deficits and impairments:  Abnormal gait, Postural dysfunction,  Decreased mobility, Cardiopulmonary status limiting activity, Decreased activity tolerance, Decreased endurance, Decreased strength, Difficulty walking, Decreased balance  Visit Diagnosis: Balance problem  Repeated falls  Muscle weakness (generalized)  Difficulty in walking, not elsewhere classified     Problem List Patient Active Problem List   Diagnosis Date Noted  . Familial hypercholesteremia 02/21/2018  . Psychogenic gait 10/21/2017  . Coronary artery disease involving native coronary artery of native heart without angina pectoris 06/09/2017  . Abnormal nuclear cardiac imaging test 05/25/2017  . PVC's (premature ventricular contractions) 05/17/2017  . Anxiety 12/29/2016  . Vascular parkinsonism ([place]) 06/29/2016  . Gait disorder 04/07/2016  . Intracranial carotid stenosis, bilateral 02/27/2016  . Essential hypertension   . Left-sided weakness 12/28/2015  . Headache 12/28/2015  . Adjustment reaction with anxiety 12/20/2015  . Left hemiparesis ([place])   . History of CVA with residual deficit   . Prediabetes   . SVT (supraventricular tachycardia) ([place]) 12/14/2015  . Stroke-like symptom 12/13/2015  . History of stroke   . Heart palpitations 09/06/2012  . Hypertension   . Familial hyperlipidemia   . Hypothyroidism   . Vitamin D deficiency   . Palpitations   . History of dizziness   . LVH (left ventricular hypertrophy)   . Aortic stenosis   . Mitral regurgitation   . SOB (shortness of breath)   . Difficulty walking     Scot Jun, PTA 12/06/2019, 12:33 PM  [place], [place], [address] Phone: [phone]   Fax:  [phone]  Name: Stacey Ramirez MRN: [id number] Date of Birth: 05-Mar-1941

## 2019-12-07 ENCOUNTER — Ambulatory Visit: Payer: PPO | Admitting: Physical Therapy

## 2019-12-07 DIAGNOSIS — M545 Low back pain, unspecified: Secondary | ICD-10-CM

## 2019-12-07 DIAGNOSIS — R2689 Other abnormalities of gait and mobility: Secondary | ICD-10-CM | POA: Diagnosis not present

## 2019-12-07 DIAGNOSIS — G20 Parkinson's disease: Secondary | ICD-10-CM

## 2019-12-07 DIAGNOSIS — R262 Difficulty in walking, not elsewhere classified: Secondary | ICD-10-CM

## 2019-12-07 DIAGNOSIS — M62830 Muscle spasm of back: Secondary | ICD-10-CM

## 2019-12-07 DIAGNOSIS — M6281 Muscle weakness (generalized): Secondary | ICD-10-CM

## 2019-12-07 DIAGNOSIS — G8194 Hemiplegia, unspecified affecting left nondominant side: Secondary | ICD-10-CM

## 2019-12-07 DIAGNOSIS — I63411 Cerebral infarction due to embolism of right middle cerebral artery: Secondary | ICD-10-CM

## 2019-12-07 DIAGNOSIS — F419 Anxiety disorder, unspecified: Secondary | ICD-10-CM

## 2019-12-07 DIAGNOSIS — R296 Repeated falls: Secondary | ICD-10-CM

## 2019-12-07 NOTE — Therapy (Signed)
[place] [place] [address] [place], [place], [address] Phone: [phone]   Fax:  [phone]  Physical Therapy Treatment  Patient Details  Name: Stacey Ramirez MRN: [id number] Date of Birth: 05/11/1941 Referring Provider (PT): Pharr   Encounter Date: 12/07/2019  PT End of Session - 12/07/19 0370    Visit Number  18    Date for PT Re-Evaluation  12/14/19    PT Start Time  1145    PT Stop Time  1234    PT Time Calculation (min)  49 min    Activity Tolerance  Patient limited by fatigue    Behavior During Therapy  [facility] for tasks assessed/performed       Past Medical History:  Diagnosis Date  . Adjustment reaction with anxiety 12/20/2015  . Aortic stenosis   . CAD (coronary artery disease) 06/09/2017   Nuc study 10/18: EF 69, inferolateral perfusion defect-possible small infarct with peri-infarct ischemia; intermediate risk // LHC 10/18: pLAD 25, pLCx 50, pRCA 25 >> med Rx  . Carotid stenosis, asymptomatic, bilateral 02/27/2016   Carotid US 5/17: Bilateral ICA 1-39  . Cerebrovascular accident (stroke) ([place])    a. 12/2015 - cryptogenic.  S/P MDT Linq.  . Difficulty walking   . Essential hypertension   . Gait disorder 04/07/2016  . Heart palpitations 09/06/2012  . History of CVA with residual deficit 12/14/2015  . History of dizziness   . History of echocardiogram    Echo 10/18: Vigorous LVF, EF 65-70, normal wall motion, grade 1 diastolic dysfunction, GLS -21.1%, mild RAE  . History of nuclear stress test    Nuclear stress test 10/18: EF 69, inf-lat defect c/w poss infarct with peri-infarct ischemia; Intermediate Risk  . Hyperlipidemia   . Hypothyroidism   . Left hemiparesis ([place])   . LVH (left ventricular hypertrophy)   . Mitral regurgitation    a. 12/2015 Echo: EF 60-65%, mild focal basal hypertrophy. No rwma, triv AI, mild MR, mildly dil LA w/o thrombus. No RA thrombus. No PFO.  Marland Kitchen Palpitations   . Prediabetes   .  Stroke-like symptom 12/13/2015  . SVT (supraventricular tachycardia) ([place]) 12/14/2015  . Vascular parkinsonism ([place]) 06/29/2016  . Vitamin D deficiency     Past Surgical History:  Procedure Laterality Date  . CARDIOVASCULAR STRESS TEST  2002   NORMAL  . EP IMPLANTABLE DEVICE N/A 12/17/2015   Procedure: Loop Recorder Insertion;  Surgeon: [name], MD;  Location: [place] CV LAB;  Service: Cardiovascular;  Laterality: N/A;  . LEFT HEART CATH AND CORONARY ANGIOGRAPHY N/A 05/25/2017   Procedure: LEFT HEART CATH AND CORONARY ANGIOGRAPHY;  Surgeon: Sherren Mocha, MD;  Location: [place] CV LAB;  Service: Cardiovascular;  Laterality: N/A;  . LOOP RECORDER IMPLANT    . TEE WITHOUT CARDIOVERSION N/A 12/17/2015   Procedure: TRANSESOPHAGEAL ECHOCARDIOGRAM (TEE);  Surgeon: Lelon Perla, MD;  Location: [facility] ENDOSCOPY;  Service: Cardiovascular;  Laterality: N/A;  Pt also needs a LOOP  . TRANSTHORACIC ECHOCARDIOGRAM  2008   SHOWED LEFT VENTRICULAR HYPERTROPHY AND MILD AORTIC STENOSIS    There were no vitals filed for this visit.  Subjective Assessment - 12/07/19 1155    Subjective  balance is still a problem, L leg is the weakest    Currently in Pain?  No/denies    Pain Score  0-No pain  [place] Adult PT Treatment/Exercise - 12/07/19 0001      Ambulation/Gait   Ambulation/Gait  Yes    Ambulation/Gait Assistance  4: Min guard    Ambulation Distance (Feet)  120 Feet    Gait Pattern  Step-to pattern;Step-through pattern;Decreased arm swing - right;Decreased arm swing - left;Decreased step length - left;Decreased stride length;Decreased hip/knee flexion - left      High Level Balance   High Level Balance Activities  Side stepping;Marching forwards;Marching backwards      Knee/Hip Exercises: Aerobic   Nustep  L4, 6 min      Knee/Hip Exercises: Machines for Strengthening   Cybex Knee Extension  10# 2x10, LLE 5#    Cybex Knee Flexion  25lb 2x10, LLE  10lb 2x10    Cybex Leg Press  20#, 2x10      Knee/Hip Exercises: Standing   Other Standing Knee Exercises  standing kicks, and abd, alt LE 2x10               PT Short Term Goals - 11/14/19 1400      PT SHORT TERM GOAL #1   Title  independent with her home HEP    Status  Achieved        PT Long Term Goals - 12/07/19 1248      PT LONG TERM GOAL #4   Title  be able to do a sit to stand without assistance and without losing balance for functional use    Baseline  pt still slow with STS and decreased initial balance    Status  Partially Met            Plan - 12/07/19 1242    Clinical Impression Statement  Balance is still a problem, cues needed to increase step length and pick up feet, LOB w/ hip abd exercies mostly when balancing on her L LE, c/o some quad pain during SL knee ext, difficulty lifting L LE during walking marches.    PT Treatment/Interventions  ADLs/Self Care Home Management;Functional mobility training;Stair training;Gait training;Therapeutic activities;Therapeutic exercise;Balance training;Neuromuscular re-education;Manual techniques;Patient/family education    PT Next Visit Plan  push balance and mobility       Patient will benefit from skilled therapeutic intervention in order to improve the following deficits and impairments:  Abnormal gait, Postural dysfunction, Decreased mobility, Cardiopulmonary status limiting activity, Decreased activity tolerance, Decreased endurance, Decreased strength, Difficulty walking, Decreased balance  Visit Diagnosis: Balance problem  Repeated falls  Muscle weakness (generalized)  Difficulty in walking, not elsewhere classified  Left hemiparesis (HCC)  Parkinson disease (HCC)  Acute bilateral low back pain without sciatica  Muscle spasm of back  Anxiety  Stroke due to embolism of right middle cerebral artery [facility])     Problem List Patient Active Problem List   Diagnosis Date Noted  . Familial  hypercholesteremia 02/21/2018  . Psychogenic gait 10/21/2017  . Coronary artery disease involving native coronary artery of native heart without angina pectoris 06/09/2017  . Abnormal nuclear cardiac imaging test 05/25/2017  . PVC's (premature ventricular contractions) 05/17/2017  . Anxiety 12/29/2016  . Vascular parkinsonism ([place]) 06/29/2016  . Gait disorder 04/07/2016  . Intracranial carotid stenosis, bilateral 02/27/2016  . Essential hypertension   . Left-sided weakness 12/28/2015  . Headache 12/28/2015  . Adjustment reaction with anxiety 12/20/2015  . Left hemiparesis ([place])   . History of CVA with residual deficit   . Prediabetes   . SVT (supraventricular tachycardia) ([place]) 12/14/2015  . Stroke-like symptom 12/13/2015  . History  of stroke   . Heart palpitations 09/06/2012  . Hypertension   . Familial hyperlipidemia   . Hypothyroidism   . Vitamin D deficiency   . Palpitations   . History of dizziness   . LVH (left ventricular hypertrophy)   . Aortic stenosis   . Mitral regurgitation   . SOB (shortness of breath)   . Difficulty walking     Clarene Essex, SPTA 12/07/2019, 12:53 PM  [place] [address] [place], [place], [address] Phone: [phone]   Fax:  [phone]  Name: Stacey Ramirez MRN: [id number] Date of Birth: 1940-08-27

## 2019-12-12 ENCOUNTER — Ambulatory Visit: Payer: PPO | Admitting: Physical Therapy

## 2019-12-12 ENCOUNTER — Other Ambulatory Visit: Payer: Self-pay

## 2019-12-12 DIAGNOSIS — R2689 Other abnormalities of gait and mobility: Secondary | ICD-10-CM

## 2019-12-12 DIAGNOSIS — M6281 Muscle weakness (generalized): Secondary | ICD-10-CM

## 2019-12-12 DIAGNOSIS — R296 Repeated falls: Secondary | ICD-10-CM

## 2019-12-12 DIAGNOSIS — R262 Difficulty in walking, not elsewhere classified: Secondary | ICD-10-CM

## 2019-12-12 NOTE — Therapy (Signed)
[place], [place], [address] Phone: [phone]   Fax:  [phone]  Physical Therapy Treatment  Patient Details  Name: Stacey Ramirez MRN: [id number] Date of Birth: Dec 22, 1940 Referring Provider (PT): Pharr   Encounter Date: 12/12/2019  PT End of Session - 12/12/19 1209    Visit Number  19    Date for PT Re-Evaluation  12/14/19    PT Start Time  2449    PT Stop Time  1236    PT Time Calculation (min)  51 min       Past Medical History:  Diagnosis Date  . Adjustment reaction with anxiety 12/20/2015  . Aortic stenosis   . CAD (coronary artery disease) 06/09/2017   Nuc study 10/18: EF 69, inferolateral perfusion defect-possible small infarct with peri-infarct ischemia; intermediate risk // LHC 10/18: pLAD 25, pLCx 50, pRCA 25 >> med Rx  . Carotid stenosis, asymptomatic, bilateral 02/27/2016   Carotid US 5/17: Bilateral ICA 1-39  . Cerebrovascular accident (stroke) ([place])    a. 12/2015 - cryptogenic.  S/P MDT Linq.  . Difficulty walking   . Essential hypertension   . Gait disorder 04/07/2016  . Heart palpitations 09/06/2012  . History of CVA with residual deficit 12/14/2015  . History of dizziness   . History of echocardiogram    Echo 10/18: Vigorous LVF, EF 65-70, normal wall motion, grade 1 diastolic dysfunction, GLS -21.1%, mild RAE  . History of nuclear stress test    Nuclear stress test 10/18: EF 69, inf-lat defect c/w poss infarct with peri-infarct ischemia; Intermediate Risk  . Hyperlipidemia   . Hypothyroidism   . Left hemiparesis ([place])   . LVH (left ventricular hypertrophy)   . Mitral regurgitation    a. 12/2015 Echo: EF 60-65%, mild focal basal hypertrophy. No rwma, triv AI, mild MR, mildly dil LA w/o thrombus. No RA thrombus. No PFO.  Marland Kitchen Palpitations   . Prediabetes   . Stroke-like symptom 12/13/2015  . SVT (supraventricular tachycardia) ([place]) 12/14/2015  . Vascular parkinsonism ([place])  06/29/2016  . Vitamin D deficiency     Past Surgical History:  Procedure Laterality Date  . CARDIOVASCULAR STRESS TEST  2002   NORMAL  . EP IMPLANTABLE DEVICE N/A 12/17/2015   Procedure: Loop Recorder Insertion;  Surgeon: [name], MD;  Location: [place] CV LAB;  Service: Cardiovascular;  Laterality: N/A;  . LEFT HEART CATH AND CORONARY ANGIOGRAPHY N/A 05/25/2017   Procedure: LEFT HEART CATH AND CORONARY ANGIOGRAPHY;  Surgeon: Sherren Mocha, MD;  Location: [place] CV LAB;  Service: Cardiovascular;  Laterality: N/A;  . LOOP RECORDER IMPLANT    . TEE WITHOUT CARDIOVERSION N/A 12/17/2015   Procedure: TRANSESOPHAGEAL ECHOCARDIOGRAM (TEE);  Surgeon: Lelon Perla, MD;  Location: [facility] ENDOSCOPY;  Service: Cardiovascular;  Laterality: N/A;  Pt also needs a LOOP  . TRANSTHORACIC ECHOCARDIOGRAM  2008   SHOWED LEFT VENTRICULAR HYPERTROPHY AND MILD AORTIC STENOSIS    There were no vitals filed for this visit.  Subjective Assessment - 12/12/19 1151    Subjective  "did real good yesterday with all my family here- notas good today"    Currently in Pain?  No/denies                       [facility] Adult PT Treatment/Exercise - 12/12/19 0001      Transfers   Comments  car transfer min A      Ambulation/Gait  Gait Comments  amb HHA with cuing working on stride esp with left and cadeance , minA 200 feet      Berg Balance Test   Sit to Stand  Able to stand  independently using hands    Standing Unsupported  Able to stand safely 2 minutes    Sitting with Back Unsupported but Feet Supported on Floor or Stool  Able to sit safely and securely 2 minutes    Stand to Sit  Sits safely with minimal use of hands    Transfers  Able to transfer safely, definite need of hands    Standing Unsupported with Eyes Closed  Able to stand 10 seconds with supervision    Standing Ubsupported with Feet Together  Needs help to attain position but able to stand for 30 seconds with feet together     From Standing, Reach Forward with Outstretched Arm  Can reach forward >12 cm safely (5")    From Standing Position, Pick up Object from Floor  Able to pick up shoe, needs supervision    From Standing Position, Turn to Look Behind Over each Shoulder  Looks behind from both sides and weight shifts well    Turn 360 Degrees  Able to turn 360 degrees safely but slowly    Standing Unsupported, Alternately Place Feet on Step/Stool  Able to complete >2 steps/needs minimal assist    Standing Unsupported, One Foot in [employer] balance while stepping or standing    Standing on One Leg  Tries to lift leg/unable to hold 3 seconds but remains standing independently    Total Score  36      Knee/Hip Exercises: Aerobic   Nustep  L 5 57mn    Other Aerobic  UBE L 3 3 fwd/3 backward      Knee/Hip Exercises: Machines for Strengthening   Cybex Knee Extension  10# 2x10, LLE 5# 10 x    Cybex Knee Flexion  25lb 2x10, LLE 10lb 10x      Knee/Hip Exercises: Standing   Walking with Sports Cord  20# fwd/back 5 x min/mod A with cuing    Other Standing Knee Exercises  HHA step tap 2 sets 20 alt with cuing and min A      Knee/Hip Exercises: Seated   Sit to Sand  2 sets;without UE support   wt ball press out for balance once up CG/min A              PT Short Term Goals - 11/14/19 1400      PT SHORT TERM GOAL #1   Title  independent with her home HEP    Status  Achieved        PT Long Term Goals - 12/12/19 1156      PT LONG TERM GOAL #1   Title  TUG with RW 28 seconds    Baseline  39.9 sec extra time with turns      PT LONG TERM GOAL #2   Title  increase Berg balance score to 40/56 for safety    Baseline  36/56    Status  Partially Met      PT LONG TERM GOAL #3   Title  increase LE strength (particularly ABD and ext.) to 4/5 for functional gait and safety    Status  Achieved      PT LONG TERM GOAL #4   Title  be able to do a sit to stand without assistance and without losing balance  for functional use    Baseline  pt still slow with STS and decreased initial balance    Status  Partially Met            Plan - 12/12/19 1305    Clinical Impression Statement  pt still reqiring assistance as well as VCing with most activities. limited goal progress. BERG up 3 pts but still not at goal. TUG hard to gage accuracy d/t pt talking but tunrs do take extra time. discussed with pt and husband need to D/C d/t limited progress and highly recommend continuing with indpenedant gym program.    PT Treatment/Interventions  ADLs/Self Care Home Management;Functional mobility training;Stair training;Gait training;Therapeutic activities;Therapeutic exercise;Balance training;Neuromuscular re-education;Manual techniques;Patient/family education    PT Next Visit Plan  D/C next session       Patient will benefit from skilled therapeutic intervention in order to improve the following deficits and impairments:  Abnormal gait, Postural dysfunction, Decreased mobility, Cardiopulmonary status limiting activity, Decreased activity tolerance, Decreased endurance, Decreased strength, Difficulty walking, Decreased balance  Visit Diagnosis: Balance problem  Repeated falls  Muscle weakness (generalized)  Difficulty in walking, not elsewhere classified     Problem List Patient Active Problem List   Diagnosis Date Noted  . Familial hypercholesteremia 02/21/2018  . Psychogenic gait 10/21/2017  . Coronary artery disease involving native coronary artery of native heart without angina pectoris 06/09/2017  . Abnormal nuclear cardiac imaging test 05/25/2017  . PVC's (premature ventricular contractions) 05/17/2017  . Anxiety 12/29/2016  . Vascular parkinsonism ([place]) 06/29/2016  . Gait disorder 04/07/2016  . Intracranial carotid stenosis, bilateral 02/27/2016  . Essential hypertension   . Left-sided weakness 12/28/2015  . Headache 12/28/2015  . Adjustment reaction with anxiety 12/20/2015  . Left  hemiparesis ([place])   . History of CVA with residual deficit   . Prediabetes   . SVT (supraventricular tachycardia) ([place]) 12/14/2015  . Stroke-like symptom 12/13/2015  . History of stroke   . Heart palpitations 09/06/2012  . Hypertension   . Familial hyperlipidemia   . Hypothyroidism   . Vitamin D deficiency   . Palpitations   . History of dizziness   . LVH (left ventricular hypertrophy)   . Aortic stenosis   . Mitral regurgitation   . SOB (shortness of breath)   . Difficulty walking     Annaleise Burger,ANGIE  PTA 12/12/2019, 1:08 PM  [place] [address] [place] [address] [place], [place], [address] Phone: [phone]   Fax:  [phone]  Name: Stacey Ramirez MRN: [id number] Date of Birth: 21-Feb-1941

## 2019-12-14 ENCOUNTER — Other Ambulatory Visit: Payer: Self-pay

## 2019-12-14 ENCOUNTER — Ambulatory Visit: Payer: PPO | Admitting: Physical Therapy

## 2019-12-14 DIAGNOSIS — R2689 Other abnormalities of gait and mobility: Secondary | ICD-10-CM

## 2019-12-14 DIAGNOSIS — R262 Difficulty in walking, not elsewhere classified: Secondary | ICD-10-CM

## 2019-12-14 DIAGNOSIS — R296 Repeated falls: Secondary | ICD-10-CM

## 2019-12-14 DIAGNOSIS — M6281 Muscle weakness (generalized): Secondary | ICD-10-CM

## 2019-12-14 NOTE — Therapy (Signed)
[address] [address] [place], [place], [address] Phone: [phone]   Fax:  [phone] Progress Note Reporting Period 11/14/19 to 12/14/19 for visits 11-20  See note below for Objective Data and Assessment of Progress/Goals.      Physical Therapy Treatment  Patient Details  Name: Stacey Ramirez MRN: [id number] Date of Birth: Mar 26, 1941 Referring Provider (PT): Pharr   Encounter Date: 12/14/2019  PT End of Session - 12/14/19 1207    Visit Number  20    PT Start Time  3557    PT Stop Time  1230    PT Time Calculation (min)  45 min       Past Medical History:  Diagnosis Date  . Adjustment reaction with anxiety 12/20/2015  . Aortic stenosis   . CAD (coronary artery disease) 06/09/2017   Nuc study 10/18: EF 69, inferolateral perfusion defect-possible small infarct with peri-infarct ischemia; intermediate risk // LHC 10/18: pLAD 25, pLCx 50, pRCA 25 >> med Rx  . Carotid stenosis, asymptomatic, bilateral 02/27/2016   Carotid US 5/17: Bilateral ICA 1-39  . Cerebrovascular accident (stroke) ([place])    a. 12/2015 - cryptogenic.  S/P MDT Linq.  . Difficulty walking   . Essential hypertension   . Gait disorder 04/07/2016  . Heart palpitations 09/06/2012  . History of CVA with residual deficit 12/14/2015  . History of dizziness   . History of echocardiogram    Echo 10/18: Vigorous LVF, EF 65-70, normal wall motion, grade 1 diastolic dysfunction, GLS -21.1%, mild RAE  . History of nuclear stress test    Nuclear stress test 10/18: EF 69, inf-lat defect c/w poss infarct with peri-infarct ischemia; Intermediate Risk  . Hyperlipidemia   . Hypothyroidism   . Left hemiparesis ([place])   . LVH (left ventricular hypertrophy)   . Mitral regurgitation    a. 12/2015 Echo: EF 60-65%, mild focal basal hypertrophy. No rwma, triv AI, mild MR, mildly dil LA w/o thrombus. No RA thrombus. No PFO.  Marland Kitchen Palpitations   . Prediabetes   . Stroke-like  symptom 12/13/2015  . SVT (supraventricular tachycardia) ([place]) 12/14/2015  . Vascular parkinsonism ([place]) 06/29/2016  . Vitamin D deficiency     Past Surgical History:  Procedure Laterality Date  . CARDIOVASCULAR STRESS TEST  2002   NORMAL  . EP IMPLANTABLE DEVICE N/A 12/17/2015   Procedure: Loop Recorder Insertion;  Surgeon: [name], MD;  Location: [place] CV LAB;  Service: Cardiovascular;  Laterality: N/A;  . LEFT HEART CATH AND CORONARY ANGIOGRAPHY N/A 05/25/2017   Procedure: LEFT HEART CATH AND CORONARY ANGIOGRAPHY;  Surgeon: Sherren Mocha, MD;  Location: [place] CV LAB;  Service: Cardiovascular;  Laterality: N/A;  . LOOP RECORDER IMPLANT    . TEE WITHOUT CARDIOVERSION N/A 12/17/2015   Procedure: TRANSESOPHAGEAL ECHOCARDIOGRAM (TEE);  Surgeon: Lelon Perla, MD;  Location: [facility] ENDOSCOPY;  Service: Cardiovascular;  Laterality: N/A;  Pt also needs a LOOP  . TRANSTHORACIC ECHOCARDIOGRAM  2008   SHOWED LEFT VENTRICULAR HYPERTROPHY AND MILD AORTIC STENOSIS    There were no vitals filed for this visit.  Subjective Assessment - 12/14/19 1157    Subjective  doing okay. we are talking about doing gym program    Currently in Pain?  No/denies                        [facility] Adult PT Treatment/Exercise - 12/14/19 0001      Ambulation/Gait  Gait Comments  amb HHA with cuing working on stride esp with left and cadeance , minA 200 feet      Knee/Hip Exercises: Aerobic   Nustep  L 5 55mn    Other Aerobic  UBE L 3 3 fwd/3 backward      Knee/Hip Exercises: Machines for Strengthening   Cybex Knee Extension  10# 2x10, LLE 5# 10 x    Cybex Knee Flexion  25lb 2x10, LLE 10lb 10x      Knee/Hip Exercises: Standing   Forward Step Up  Right;Left;10 reps;Hand Hold: 2;Step Height: 6"   cuing and min A   Walking with Sports Cord  20# fwd/back 5 x min/mod A with cuing    Other Standing Knee Exercises  HHA step tap 2 sets 20 alt with cuing and min A      Knee/Hip  Exercises: Seated   Sit to Sand  10 reps;without UE support   wt ball chest press- CGA               PT Short Term Goals - 11/14/19 1400      PT SHORT TERM GOAL #1   Title  independent with her home HEP    Status  Achieved        PT Long Term Goals - 12/12/19 1156      PT LONG TERM GOAL #1   Title  TUG with RW 28 seconds    Baseline  39.9 sec extra time with turns      PT LONG TERM GOAL #2   Title  increase Berg balance score to 40/56 for safety    Baseline  36/56    Status  Partially Met      PT LONG TERM GOAL #3   Title  increase LE strength (particularly ABD and ext.) to 4/5 for functional gait and safety    Status  Achieved      PT LONG TERM GOAL #4   Title  be able to do a sit to stand without assistance and without losing balance for functional use    Baseline  pt still slow with STS and decreased initial balance    Status  Partially Met            Plan - 12/14/19 1209    Clinical Impression Statement  pt has plateaued in progress past several weeks. pt continues to need constant cuing and with constant cuing does well but without cuing reverts back. pt continues to be a fall risk. pt has partial met goals and at this time have discussed with pt and spouse need to D/C and strongly recommend independant gym program    PT Treatment/Interventions  ADLs/Self Care Home Management;Functional mobility training;Stair training;Gait training;Therapeutic activities;Therapeutic exercise;Balance training;Neuromuscular re-education;Manual techniques;Patient/family education    PT Next Visit Plan  D/C       Patient will benefit from skilled therapeutic intervention in order to improve the following deficits and impairments:  Abnormal gait, Postural dysfunction, Decreased mobility, Cardiopulmonary status limiting activity, Decreased activity tolerance, Decreased endurance, Decreased strength, Difficulty walking, Decreased balance  Visit Diagnosis: Balance  problem  Repeated falls  Muscle weakness (generalized)  Difficulty in walking, not elsewhere classified     Problem List Patient Active Problem List   Diagnosis Date Noted  . Familial hypercholesteremia 02/21/2018  . Psychogenic gait 10/21/2017  . Coronary artery disease involving native coronary artery of native heart without angina pectoris 06/09/2017  . Abnormal nuclear cardiac imaging test 05/25/2017  .  PVC's (premature ventricular contractions) 05/17/2017  . Anxiety 12/29/2016  . Vascular parkinsonism ([place]) 06/29/2016  . Gait disorder 04/07/2016  . Intracranial carotid stenosis, bilateral 02/27/2016  . Essential hypertension   . Left-sided weakness 12/28/2015  . Headache 12/28/2015  . Adjustment reaction with anxiety 12/20/2015  . Left hemiparesis ([place])   . History of CVA with residual deficit   . Prediabetes   . SVT (supraventricular tachycardia) ([place]) 12/14/2015  . Stroke-like symptom 12/13/2015  . History of stroke   . Heart palpitations 09/06/2012  . Hypertension   . Familial hyperlipidemia   . Hypothyroidism   . Vitamin D deficiency   . Palpitations   . History of dizziness   . LVH (left ventricular hypertrophy)   . Aortic stenosis   . Mitral regurgitation   . SOB (shortness of breath)   . Difficulty walking    PHYSICAL THERAPY DISCHARGE SUMMARY   Plan: Patient agrees to discharge.  Patient goals were partially met. Patient is being discharged due to lack of progress.  ?????      Walton Digilio,ANGIE PTA 12/14/2019, 12:27 PM  [place], [place], [address] Phone: [phone]   Fax:  [phone]  Name: Stacey Ramirez MRN: [id number] Date of Birth: [birth year]

## 2020-02-19 IMAGING — DX DG CHEST 1V PORT
1 series · 1 of 1 positions shown · non-contrast
Comparison: 08/09/2017

CLINICAL DATA: Fall with chest pain

EXAM:
PORTABLE CHEST 1 VIEW

[chest]
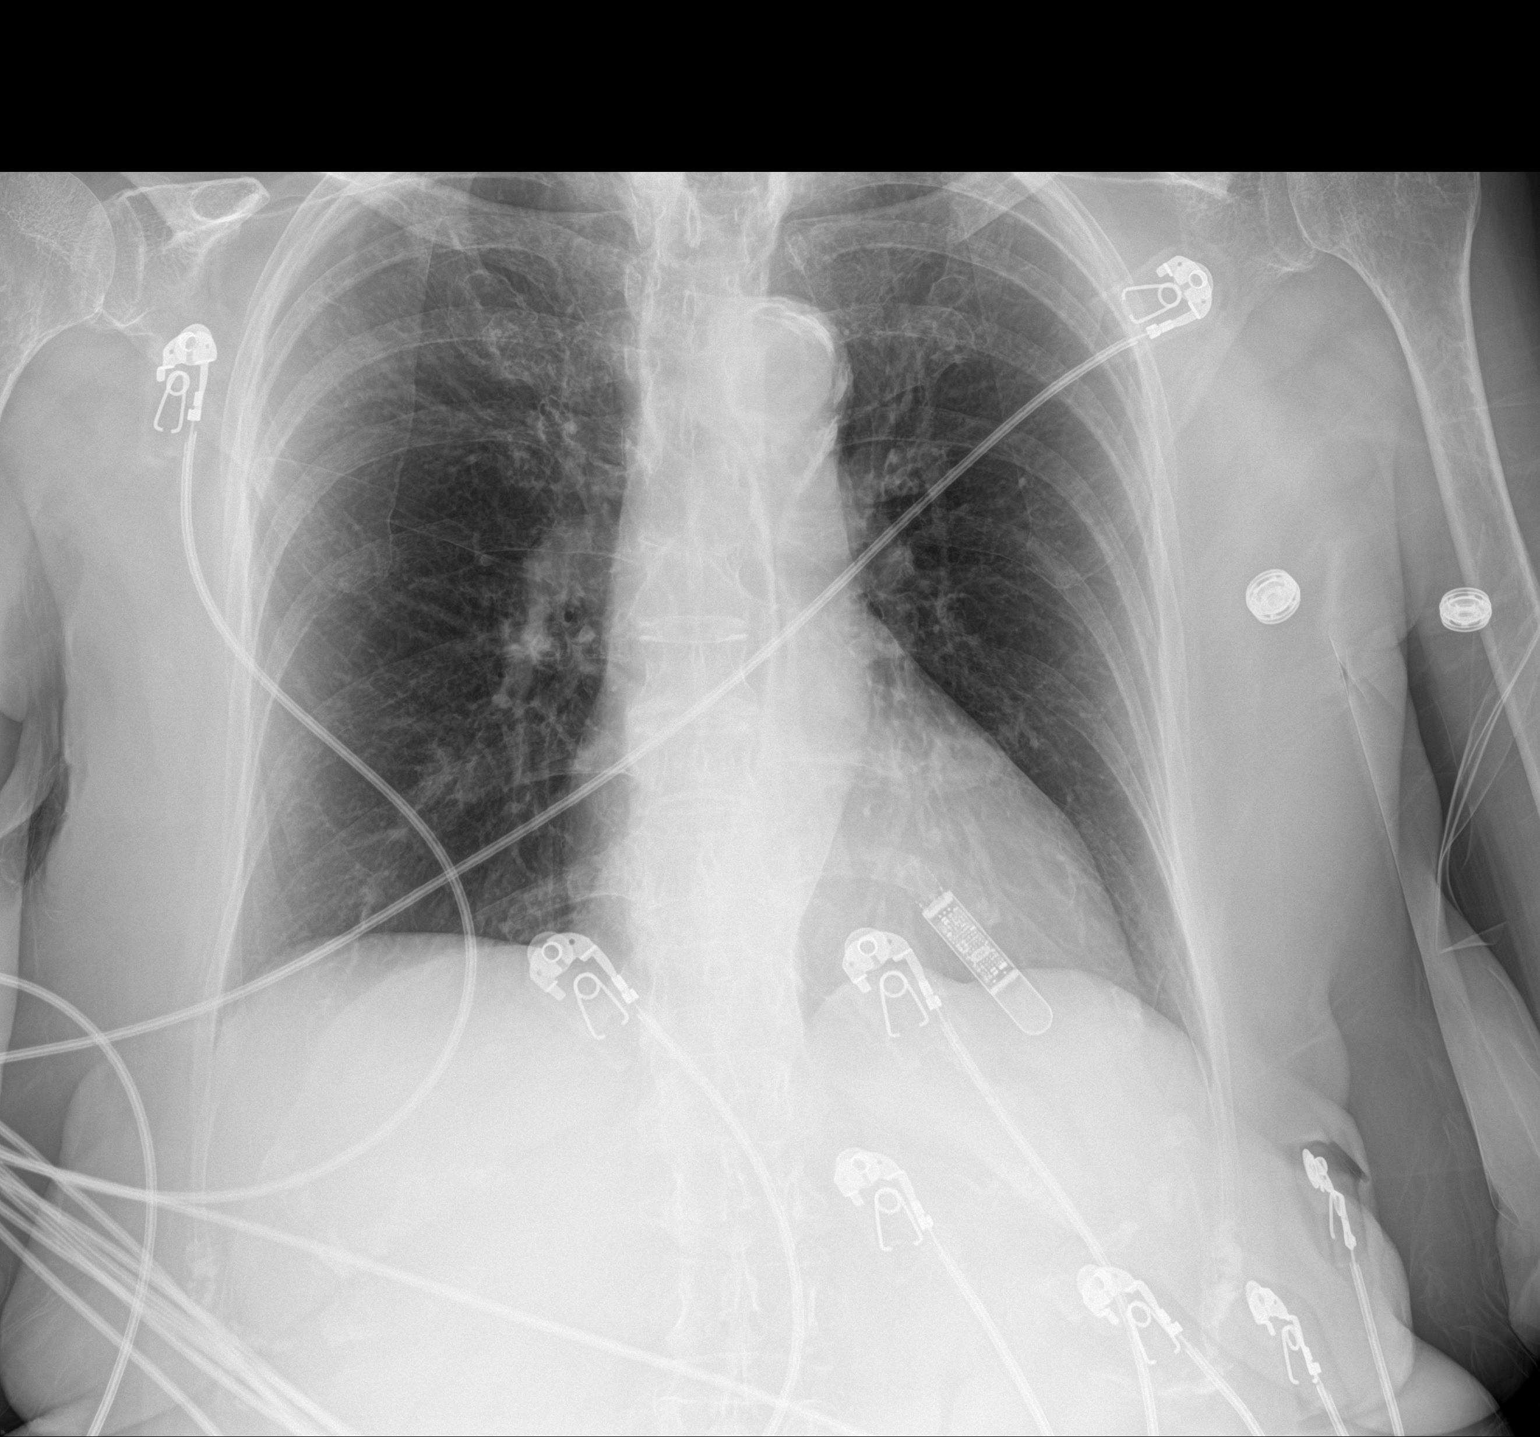

[1 of 1 positions shown; findings below may reference images not displayed]

FINDINGS: Normal heart size with mild aortic tortuosity. Implantable loop
recorder is present. There is no edema, consolidation, effusion, or
pneumothorax.. Osteopenia without evident fracture.

Artifact from EKG leads.
IMPRESSION: No acute finding.

## 2020-03-18 DIAGNOSIS — Z87440 Personal history of urinary (tract) infections: Secondary | ICD-10-CM | POA: Diagnosis not present

## 2020-03-18 DIAGNOSIS — N3941 Urge incontinence: Secondary | ICD-10-CM | POA: Diagnosis not present

## 2020-03-18 DIAGNOSIS — R8290 Unspecified abnormal findings in urine: Secondary | ICD-10-CM | POA: Diagnosis not present

## 2020-04-05 DIAGNOSIS — N3941 Urge incontinence: Secondary | ICD-10-CM | POA: Diagnosis not present

## 2020-04-05 DIAGNOSIS — N3944 Nocturnal enuresis: Secondary | ICD-10-CM | POA: Diagnosis not present

## 2020-04-05 DIAGNOSIS — R3915 Urgency of urination: Secondary | ICD-10-CM | POA: Diagnosis not present

## 2020-04-15 ENCOUNTER — Other Ambulatory Visit: Payer: Self-pay

## 2020-04-15 MED ORDER — DILTIAZEM HCL ER COATED BEADS 180 MG PO CP24: 180.0000 mg | ORAL_CAPSULE | Freq: Every day | ORAL | 7 refills | Status: DC

## 2020-06-04 DIAGNOSIS — Z87440 Personal history of urinary (tract) infections: Secondary | ICD-10-CM | POA: Diagnosis not present

## 2020-06-04 DIAGNOSIS — N3941 Urge incontinence: Secondary | ICD-10-CM | POA: Diagnosis not present

## 2020-06-11 ENCOUNTER — Other Ambulatory Visit: Payer: Self-pay

## 2020-06-11 MED ORDER — NEBIVOLOL HCL 2.5 MG PO TABS: 2.5000 mg | ORAL_TABLET | Freq: Every day | ORAL | 5 refills | Status: DC

## 2020-07-02 ENCOUNTER — Ambulatory Visit: Payer: PPO | Attending: Internal Medicine | Admitting: Physical Therapy

## 2020-07-02 ENCOUNTER — Other Ambulatory Visit: Payer: Self-pay

## 2020-07-02 ENCOUNTER — Encounter: Payer: Self-pay | Admitting: Physical Therapy

## 2020-07-02 DIAGNOSIS — R2689 Other abnormalities of gait and mobility: Secondary | ICD-10-CM

## 2020-07-02 DIAGNOSIS — R296 Repeated falls: Secondary | ICD-10-CM

## 2020-07-02 DIAGNOSIS — R262 Difficulty in walking, not elsewhere classified: Secondary | ICD-10-CM

## 2020-07-02 DIAGNOSIS — M6281 Muscle weakness (generalized): Secondary | ICD-10-CM

## 2020-07-02 NOTE — Therapy (Signed)
[place]. [place], [place], [address] Phone: [phone]   Fax:  [phone]  Physical Therapy Evaluation  Patient Details  Name: Stacey Ramirez MRN: [id number] Date of Birth: 1940-09-24 Referring Provider (PT): Pharr   Encounter Date: 07/02/2020   PT End of Session - 07/02/20 1429    Visit Number 1    Date for PT Re-Evaluation 09/01/20    Authorization Type [employer]    PT Start Time 1355    PT Stop Time 1430    PT Time Calculation (min) 35 min    Activity Tolerance Patient tolerated treatment well    Behavior During Therapy [facility] for tasks assessed/performed           Past Medical History:  Diagnosis Date  . Adjustment reaction with anxiety 12/20/2015  . Aortic stenosis   . CAD (coronary artery disease) 06/09/2017   Nuc study 10/18: EF 69, inferolateral perfusion defect-possible small infarct with peri-infarct ischemia; intermediate risk // LHC 10/18: pLAD 25, pLCx 50, pRCA 25 >> med Rx  . Carotid stenosis, asymptomatic, bilateral 02/27/2016   Carotid US 5/17: Bilateral ICA 1-39  . Cerebrovascular accident (stroke) ([place])    a. 12/2015 - cryptogenic.  S/P MDT Linq.  . Difficulty walking   . Essential hypertension   . Gait disorder 04/07/2016  . Heart palpitations 09/06/2012  . History of CVA with residual deficit 12/14/2015  . History of dizziness   . History of echocardiogram    Echo 10/18: Vigorous LVF, EF 65-70, normal wall motion, grade 1 diastolic dysfunction, GLS -21.1%, mild RAE  . History of nuclear stress test    Nuclear stress test 10/18: EF 69, inf-lat defect c/w poss infarct with peri-infarct ischemia; Intermediate Risk  . Hyperlipidemia   . Hypothyroidism   . Left hemiparesis ([place])   . LVH (left ventricular hypertrophy)   . Mitral regurgitation    a. 12/2015 Echo: EF 60-65%, mild focal basal hypertrophy. No rwma, triv AI, mild MR, mildly dil LA w/o thrombus. No RA thrombus. No PFO.  Marland Kitchen  Palpitations   . Prediabetes   . Stroke-like symptom 12/13/2015  . SVT (supraventricular tachycardia) ([place]) 12/14/2015  . Vascular parkinsonism ([place]) 06/29/2016  . Vitamin D deficiency     Past Surgical History:  Procedure Laterality Date  . CARDIOVASCULAR STRESS TEST  2002   NORMAL  . EP IMPLANTABLE DEVICE N/A 12/17/2015   Procedure: Loop Recorder Insertion;  Surgeon: [name], MD;  Location: [place] [facility];  Service: Cardiovascular;  Laterality: N/A;  . LEFT HEART CATH AND CORONARY ANGIOGRAPHY N/A 05/25/2017   Procedure: LEFT HEART CATH AND CORONARY ANGIOGRAPHY;  Surgeon: Sherren Mocha, MD;  Location: [place] [facility];  Service: Cardiovascular;  Laterality: N/A;  . LOOP RECORDER IMPLANT    . TEE WITHOUT CARDIOVERSION N/A 12/17/2015   Procedure: TRANSESOPHAGEAL ECHOCARDIOGRAM (TEE);  Surgeon: Lelon Perla, MD;  Location: [facility] ENDOSCOPY;  Service: Cardiovascular;  Laterality: N/A;  Pt also needs a LOOP  . TRANSTHORACIC ECHOCARDIOGRAM  2008   SHOWED LEFT VENTRICULAR HYPERTROPHY AND MILD AORTIC STENOSIS    There were no vitals filed for this visit.    Subjective Assessment - 07/02/20 1401    Subjective Patient is familair to me as we have seen her numerous times over the past few years, she typically will do well and get back to walking better and being more mobile and safe, when we discharge we ask her husband to bring her  in to do some of the gym equipment, this does not last long and she does not do exercises at home.  She denies falls but reports that she is really having more and more difficulty walking    Limitations Standing;Walking;House hold activities    Patient Stated Goals walk better, move better    Currently in Pain? No/denies              [facility] PT Assessment - 07/02/20 0001      Assessment   Medical Diagnosis difficulty walking, weakness    Referring Provider (PT) Pharr    Onset Date/Surgical Date 06/01/20    Prior Therapy yes multiple times       Precautions   Precautions Fall      Balance Screen   Has the patient fallen in the past 6 months No    Has the patient had a decrease in activity level because of a fear of falling?  Yes    Is the patient reluctant to leave their home because of a fear of falling?  Yes      [place] residence    Additional Comments uses [phone] around house, 2 steps into the house, lives with husband, he cares for her and does most of the ADL's.      Prior Function   Level of Independence Independent with household mobility with device;Needs assistance with ADLs;Needs assistance with homemaking    Vocation Retired    Leisure no exercise, likes to go to the [employer] Comments fwd head, rounded shoulders      ROM / Strength   AROM / PROM / Strength AROM;Strength      AROM   Overall AROM Comments slight decreased TKE about 10 degrees from rull extension      Strength   Overall Strength Comments hips 4-/5, knees 4/5, ankles 4-/5, UE's 4-/5      Transfers   Comments has to use arm rest      Ambulation/Gait   Gait Comments uses a FWW, slow shuffling steps, these gets worse with transfers and turning, small shuffling steps to get started      Standardized Balance Assessment   Standardized Balance Assessment Timed Up and Go Test      Timed Up and Go Test   Normal TUG (seconds) 82    TUG Comments FWW                      Objective measurements completed on examination: See above findings.       [facility] Adult PT Treatment/Exercise - 07/02/20 0001      Exercises   Exercises Knee/Hip      Knee/Hip Exercises: Aerobic   Nustep level 3 x 6 minutes                    PT Short Term Goals - 07/02/20 1434      PT SHORT TERM GOAL #1   Title independent with her home HEP    Time 2    Period Weeks    Status New             PT Long Term Goals - 07/02/20 1434      PT LONG TERM GOAL #1   Title  TUG with RW 28 seconds    Time 12    Period Weeks    Status New  PT LONG TERM GOAL #2   Title increase Berg balance score to 40/56 for safety    Time 12    Period Weeks    Status New      PT LONG TERM GOAL #3   Title increase LE strength (particularly ABD and ext.) to 4/5 for functional gait and safety    Time 12    Period Weeks    Status New      PT LONG TERM GOAL #4   Title be able to do a sit to stand without assistance and without losing balance for functional use    Time 12    Period Weeks    Status New                  Plan - 07/02/20 1430    Clinical Impression Statement Patient has had numerous sessions with PT in the past due to difficulty walking and weakness, she usually does well and gets to walking better but will always regress, she reports that she tries the exercises at home and we have even had her and her husband come in and use the gym to keep her moving.  Her TUG time was 82 seconds, she has difficulty standing from sitting, she has a slow shuffling gait at times but there are times that she will move well with some cues.  She reports that she has her husband help with dressing and bathing.  Her husband does all of the cooking    Stability/Clinical Decision Making Stable/Uncomplicated    Rehab Potential Good    PT Frequency 2x / week    PT Duration 8 weeks    PT Treatment/Interventions ADLs/Self Care Home Management;Gait training;Neuromuscular re-education;Balance training;Therapeutic exercise;Therapeutic activities;Functional mobility training;Stair training;Patient/family education;Manual techniques    PT Next Visit Plan start exercises and walking/balance    Consulted and Agree with Plan of Care Patient           Patient will benefit from skilled therapeutic intervention in order to improve the following deficits and impairments:  Abnormal gait, Decreased range of motion, Difficulty walking, Cardiopulmonary status limiting activity, Decreased  endurance, Decreased activity tolerance, Decreased balance, Decreased mobility  Visit Diagnosis: Balance problem - Plan: PT plan of care cert/re-cert  Repeated falls - Plan: PT plan of care cert/re-cert  Muscle weakness (generalized) - Plan: PT plan of care cert/re-cert  Difficulty in walking, not elsewhere classified - Plan: PT plan of care cert/re-cert     Problem List Patient Active Problem List   Diagnosis Date Noted  . Familial hypercholesteremia 02/21/2018  . Psychogenic gait 10/21/2017  . Coronary artery disease involving native coronary artery of native heart without angina pectoris 06/09/2017  . Abnormal nuclear cardiac imaging test 05/25/2017  . PVC's (premature ventricular contractions) 05/17/2017  . Anxiety 12/29/2016  . Vascular parkinsonism ([place]) 06/29/2016  . Gait disorder 04/07/2016  . Intracranial carotid stenosis, bilateral 02/27/2016  . Essential hypertension   . Left-sided weakness 12/28/2015  . Headache 12/28/2015  . Adjustment reaction with anxiety 12/20/2015  . Left hemiparesis ([place])   . History of CVA with residual deficit   . Prediabetes   . SVT (supraventricular tachycardia) ([place]) 12/14/2015  . Stroke-like symptom 12/13/2015  . History of stroke   . Heart palpitations 09/06/2012  . Hypertension   . Familial hyperlipidemia   . Hypothyroidism   . Vitamin D deficiency   . Palpitations   . History of dizziness   . LVH (left ventricular hypertrophy)   .  Aortic stenosis   . Mitral regurgitation   . SOB (shortness of breath)   . Difficulty walking     Sumner Boast., PT 07/02/2020, 3:12 PM  [address]. [place], [place], [address] Phone: [phone]   Fax:  [phone]  Name: Stacey Ramirez MRN: [id number] Date of Birth: 1940/08/30

## 2020-07-09 ENCOUNTER — Other Ambulatory Visit: Payer: Self-pay

## 2020-07-09 ENCOUNTER — Ambulatory Visit: Payer: PPO | Attending: Internal Medicine | Admitting: Physical Therapy

## 2020-07-09 ENCOUNTER — Encounter: Payer: Self-pay | Admitting: Physical Therapy

## 2020-07-09 DIAGNOSIS — M6281 Muscle weakness (generalized): Secondary | ICD-10-CM | POA: Diagnosis not present

## 2020-07-09 DIAGNOSIS — R296 Repeated falls: Secondary | ICD-10-CM | POA: Diagnosis not present

## 2020-07-09 DIAGNOSIS — R262 Difficulty in walking, not elsewhere classified: Secondary | ICD-10-CM | POA: Diagnosis not present

## 2020-07-09 DIAGNOSIS — Z87440 Personal history of urinary (tract) infections: Secondary | ICD-10-CM | POA: Diagnosis not present

## 2020-07-09 DIAGNOSIS — R2689 Other abnormalities of gait and mobility: Secondary | ICD-10-CM | POA: Diagnosis not present

## 2020-07-09 DIAGNOSIS — N3941 Urge incontinence: Secondary | ICD-10-CM | POA: Diagnosis not present

## 2020-07-09 NOTE — Therapy (Signed)
[address]. [place], [place], [address] Phone: [phone]   Fax:  [phone]  Physical Therapy Treatment  Patient Details  Name: Stacey Ramirez MRN: [id number] Date of Birth: 1941-01-27 Referring Provider (PT): Pharr   Encounter Date: 07/09/2020   PT End of Session - 07/09/20 1643    Visit Number 2    Date for PT Re-Evaluation 09/01/20    Authorization Type [employer]    PT Start Time 1600    PT Stop Time 1643    PT Time Calculation (min) 43 min    Activity Tolerance Patient tolerated treatment well    Behavior During Therapy [facility] for tasks assessed/performed           Past Medical History:  Diagnosis Date  . Adjustment reaction with anxiety 12/20/2015  . Aortic stenosis   . CAD (coronary artery disease) 06/09/2017   Nuc study 10/18: EF 69, inferolateral perfusion defect-possible small infarct with peri-infarct ischemia; intermediate risk // LHC 10/18: pLAD 25, pLCx 50, pRCA 25 >> med Rx  . Carotid stenosis, asymptomatic, bilateral 02/27/2016   Carotid US 5/17: Bilateral ICA 1-39  . Cerebrovascular accident (stroke) ([place])    a. 12/2015 - cryptogenic.  S/P MDT Linq.  . Difficulty walking   . Essential hypertension   . Gait disorder 04/07/2016  . Heart palpitations 09/06/2012  . History of CVA with residual deficit 12/14/2015  . History of dizziness   . History of echocardiogram    Echo 10/18: Vigorous LVF, EF 65-70, normal wall motion, grade 1 diastolic dysfunction, GLS -21.1%, mild RAE  . History of nuclear stress test    Nuclear stress test 10/18: EF 69, inf-lat defect c/w poss infarct with peri-infarct ischemia; Intermediate Risk  . Hyperlipidemia   . Hypothyroidism   . Left hemiparesis ([place])   . LVH (left ventricular hypertrophy)   . Mitral regurgitation    a. 12/2015 Echo: EF 60-65%, mild focal basal hypertrophy. No rwma, triv AI, mild MR, mildly dil LA w/o thrombus. No RA thrombus. No PFO.  Marland Kitchen  Palpitations   . Prediabetes   . Stroke-like symptom 12/13/2015  . SVT (supraventricular tachycardia) ([place]) 12/14/2015  . Vascular parkinsonism ([place]) 06/29/2016  . Vitamin D deficiency     Past Surgical History:  Procedure Laterality Date  . CARDIOVASCULAR STRESS TEST  2002   NORMAL  . EP IMPLANTABLE DEVICE N/A 12/17/2015   Procedure: Loop Recorder Insertion;  Surgeon: [name], MD;  Location: [place] [facility];  Service: Cardiovascular;  Laterality: N/A;  . LEFT HEART CATH AND CORONARY ANGIOGRAPHY N/A 05/25/2017   Procedure: LEFT HEART CATH AND CORONARY ANGIOGRAPHY;  Surgeon: Sherren Mocha, MD;  Location: [place] [facility];  Service: Cardiovascular;  Laterality: N/A;  . LOOP RECORDER IMPLANT    . TEE WITHOUT CARDIOVERSION N/A 12/17/2015   Procedure: TRANSESOPHAGEAL ECHOCARDIOGRAM (TEE);  Surgeon: Lelon Perla, MD;  Location: [facility] ENDOSCOPY;  Service: Cardiovascular;  Laterality: N/A;  Pt also needs a LOOP  . TRANSTHORACIC ECHOCARDIOGRAM  2008   SHOWED LEFT VENTRICULAR HYPERTROPHY AND MILD AORTIC STENOSIS    There were no vitals filed for this visit.   Subjective Assessment - 07/09/20 1604    Subjective feeling a little rushed today and tired    Currently in Pain? No/denies                             [facility] Adult PT  Treatment/Exercise - 07/09/20 0001      High Level Balance   High Level Balance Activities Side stepping    High Level Balance Comments HHA x 2       Exercises   Exercises Lumbar      Lumbar Exercises: Seated   Other Seated Lumbar Exercises Rows red Tband 2x10       Knee/Hip Exercises: Aerobic   Nustep level 4 x 7 minutes      Knee/Hip Exercises: Seated   Long Arc Quad Both;2 sets;10 reps;Weights;Strengthening    Long Arc Quad Weight 2 lbs.    Ball Squeeze 2x10    Marching 2 sets;10 reps;Weights    Marching Weights 2 lbs.    Abduction/Adduction  2 sets;10 reps;Both;Strengthening    Abd/Adduction Limitations manual resistance      Sit to Sand 2 sets;5 reps;with UE support                    PT Short Term Goals - 07/02/20 1434      PT SHORT TERM GOAL #1   Title independent with her home HEP    Time 2    Period Weeks    Status New             PT Long Term Goals - 07/02/20 1434      PT LONG TERM GOAL #1   Title TUG with RW 28 seconds    Time 12    Period Weeks    Status New      PT LONG TERM GOAL #2   Title increase Berg balance score to 40/56 for safety    Time 12    Period Weeks    Status New      PT LONG TERM GOAL #3   Title increase LE strength (particularly ABD and ext.) to 4/5 for functional gait and safety    Time 12    Period Weeks    Status New      PT LONG TERM GOAL #4   Title be able to do a sit to stand without assistance and without losing balance for functional use    Time 12    Period Weeks    Status New                 Plan - 07/09/20 1644    Clinical Impression Statement Pt tolerated an initial progression to TE well today. She has some LE and core weakness with sit to stands. Core weakness also present with unsupported sitting LAQ and marching, constant cues to needed not to lean back. Cue needed to squeeze quad with seated LAQ. Decrease foot clearance noted with side step, cues needed not to allow LE to drag floor.    Stability/Clinical Decision Making Stable/Uncomplicated    Rehab Potential Good    PT Frequency 2x / week    PT Treatment/Interventions ADLs/Self Care Home Management;Gait training;Neuromuscular re-education;Balance training;Therapeutic exercise;Therapeutic activities;Functional mobility training;Stair training;Patient/family education;Manual techniques    PT Next Visit Plan start exercises and walking/balance           Patient will benefit from skilled therapeutic intervention in order to improve the following deficits and impairments:  Abnormal gait, Decreased range of motion, Difficulty walking, Cardiopulmonary status limiting  activity, Decreased endurance, Decreased activity tolerance, Decreased balance, Decreased mobility  Visit Diagnosis: Repeated falls  Muscle weakness (generalized)  Balance problem     Problem List Patient Active Problem List   Diagnosis Date Noted  .  Familial hypercholesteremia 02/21/2018  . Psychogenic gait 10/21/2017  . Coronary artery disease involving native coronary artery of native heart without angina pectoris 06/09/2017  . Abnormal nuclear cardiac imaging test 05/25/2017  . PVC's (premature ventricular contractions) 05/17/2017  . Anxiety 12/29/2016  . Vascular parkinsonism ([place]) 06/29/2016  . Gait disorder 04/07/2016  . Intracranial carotid stenosis, bilateral 02/27/2016  . Essential hypertension   . Left-sided weakness 12/28/2015  . Headache 12/28/2015  . Adjustment reaction with anxiety 12/20/2015  . Left hemiparesis ([place])   . History of CVA with residual deficit   . Prediabetes   . SVT (supraventricular tachycardia) ([place]) 12/14/2015  . Stroke-like symptom 12/13/2015  . History of stroke   . Heart palpitations 09/06/2012  . Hypertension   . Familial hyperlipidemia   . Hypothyroidism   . Vitamin D deficiency   . Palpitations   . History of dizziness   . LVH (left ventricular hypertrophy)   . Aortic stenosis   . Mitral regurgitation   . SOB (shortness of breath)   . Difficulty walking     Scot Jun, PTA 07/09/2020, 4:51 PM  [address]. [place], [place], [address] Phone: [phone]   Fax:  [phone]  Name: Stacey Ramirez MRN: [id number] Date of Birth: 07-20-1941

## 2020-07-11 ENCOUNTER — Encounter: Payer: Self-pay | Admitting: Physical Therapy

## 2020-07-11 ENCOUNTER — Ambulatory Visit: Payer: PPO | Admitting: Physical Therapy

## 2020-07-11 ENCOUNTER — Other Ambulatory Visit: Payer: Self-pay

## 2020-07-11 DIAGNOSIS — R296 Repeated falls: Secondary | ICD-10-CM | POA: Diagnosis not present

## 2020-07-11 DIAGNOSIS — R2689 Other abnormalities of gait and mobility: Secondary | ICD-10-CM

## 2020-07-11 DIAGNOSIS — R262 Difficulty in walking, not elsewhere classified: Secondary | ICD-10-CM

## 2020-07-11 DIAGNOSIS — M6281 Muscle weakness (generalized): Secondary | ICD-10-CM

## 2020-07-11 NOTE — Therapy (Signed)
[place]. [place], [place], [address] Phone: [phone]   Fax:  [phone]  Physical Therapy Treatment  Patient Details  Name: Stacey Ramirez MRN: [id number] Date of Birth: 1940-10-10 Referring Provider (PT): Pharr   Encounter Date: 07/11/2020   PT End of Session - 07/11/20 1440    Visit Number 3    Date for PT Re-Evaluation 09/01/20    Authorization Type [employer]    PT Start Time 1358    PT Stop Time 1440    PT Time Calculation (min) 42 min    Activity Tolerance Patient tolerated treatment well    Behavior During Therapy [facility] for tasks assessed/performed           Past Medical History:  Diagnosis Date  . Adjustment reaction with anxiety 12/20/2015  . Aortic stenosis   . CAD (coronary artery disease) 06/09/2017   Nuc study 10/18: EF 69, inferolateral perfusion defect-possible small infarct with peri-infarct ischemia; intermediate risk // LHC 10/18: pLAD 25, pLCx 50, pRCA 25 >> med Rx  . Carotid stenosis, asymptomatic, bilateral 02/27/2016   Carotid US 5/17: Bilateral ICA 1-39  . Cerebrovascular accident (stroke) ([place])    a. 12/2015 - cryptogenic.  S/P MDT Linq.  . Difficulty walking   . Essential hypertension   . Gait disorder 04/07/2016  . Heart palpitations 09/06/2012  . History of CVA with residual deficit 12/14/2015  . History of dizziness   . History of echocardiogram    Echo 10/18: Vigorous LVF, EF 65-70, normal wall motion, grade 1 diastolic dysfunction, GLS -21.1%, mild RAE  . History of nuclear stress test    Nuclear stress test 10/18: EF 69, inf-lat defect c/w poss infarct with peri-infarct ischemia; Intermediate Risk  . Hyperlipidemia   . Hypothyroidism   . Left hemiparesis ([place])   . LVH (left ventricular hypertrophy)   . Mitral regurgitation    a. 12/2015 Echo: EF 60-65%, mild focal basal hypertrophy. No rwma, triv AI, mild MR, mildly dil LA w/o thrombus. No RA thrombus. No PFO.  Marland Kitchen  Palpitations   . Prediabetes   . Stroke-like symptom 12/13/2015  . SVT (supraventricular tachycardia) ([place]) 12/14/2015  . Vascular parkinsonism ([place]) 06/29/2016  . Vitamin D deficiency     Past Surgical History:  Procedure Laterality Date  . CARDIOVASCULAR STRESS TEST  2002   NORMAL  . EP IMPLANTABLE DEVICE N/A 12/17/2015   Procedure: Loop Recorder Insertion;  Surgeon: [name], MD;  Location: [place] [facility];  Service: Cardiovascular;  Laterality: N/A;  . LEFT HEART CATH AND CORONARY ANGIOGRAPHY N/A 05/25/2017   Procedure: LEFT HEART CATH AND CORONARY ANGIOGRAPHY;  Surgeon: Sherren Mocha, MD;  Location: [place] [facility];  Service: Cardiovascular;  Laterality: N/A;  . LOOP RECORDER IMPLANT    . TEE WITHOUT CARDIOVERSION N/A 12/17/2015   Procedure: TRANSESOPHAGEAL ECHOCARDIOGRAM (TEE);  Surgeon: Lelon Perla, MD;  [facility] ENDOSCOPY;  Service: Cardiovascular;  Laterality: N/A;  Pt also needs a LOOP  . TRANSTHORACIC ECHOCARDIOGRAM  2008   SHOWED LEFT VENTRICULAR HYPERTROPHY AND MILD AORTIC STENOSIS    There were no vitals filed for this visit.   Subjective Assessment - 07/11/20 1357    Subjective feeling good today    Currently in Pain? No/denies                             [facility] Adult PT Treatment/Exercise - 07/11/20 0001  Knee/Hip Exercises: Aerobic   Nustep level 4 x 7 minutes      Knee/Hip Exercises: Standing   Heel Raises Both;1 set;10 reps    Heel Raises Limitations 2    Hip Flexion Both;1 set;10 reps    Hip Flexion Limitations 2#    Hip Abduction Both;1 set;10 reps    Abduction Limitations 2#    Hip Extension Both;1 set;10 reps    Extension Limitations 2#      Knee/Hip Exercises: Seated   Long Arc Quad Both;2 sets;10 reps;Weights;Strengthening    Long Arc Quad Weight 2 lbs.    Ball Squeeze 2x10    Marching 2 sets;10 reps;Weights    Marching Weights 2 lbs.    Hamstring Curl Both;2 sets;10 reps    Hamstring Limitations red  TB    Abduction/Adduction  Both;2 sets;10 reps    Abd/Adduction Limitations red TB    Sit to Sand 2 sets;5 reps;with UE support                    PT Short Term Goals - 07/02/20 1434      PT SHORT TERM GOAL #1   Title independent with her home HEP    Time 2    Period Weeks    Status New             PT Long Term Goals - 07/02/20 1434      PT LONG TERM GOAL #1   Title TUG with RW 28 seconds    Time 12    Period Weeks    Status New      PT LONG TERM GOAL #2   Title increase Berg balance score to 40/56 for safety    Time 12    Period Weeks    Status New      PT LONG TERM GOAL #3   Title increase LE strength (particularly ABD and ext.) to 4/5 for functional gait and safety    Time 12    Period Weeks    Status New      PT LONG TERM GOAL #4   Title be able to do a sit to stand without assistance and without losing balance for functional use    Time 12    Period Weeks    Status New                 Plan - 07/11/20 1441    Clinical Impression Statement Pt required cuing for posture/form with standing hip ex's; cues for upright posture. Verbal cuing with gait for increased step length. Pt cues for foot placement and sequencing for STS. Continue to progress strength/endurance.    PT Treatment/Interventions ADLs/Self Care Home Management;Gait training;Neuromuscular re-education;Balance training;Therapeutic exercise;Therapeutic activities;Functional mobility training;Stair training;Patient/family education;Manual techniques    PT Next Visit Plan start exercises and walking/balance    Consulted and Agree with Plan of Care Patient           Patient will benefit from skilled therapeutic intervention in order to improve the following deficits and impairments:  Abnormal gait,Decreased range of motion,Difficulty walking,Cardiopulmonary status limiting activity,Decreased endurance,Decreased activity tolerance,Decreased balance,Decreased mobility  Visit  Diagnosis: Repeated falls  Muscle weakness (generalized)  Balance problem  Difficulty in walking, not elsewhere classified     Problem List Patient Active Problem List   Diagnosis Date Noted  . Familial hypercholesteremia 02/21/2018  . Psychogenic gait 10/21/2017  . Coronary artery disease involving native coronary artery of native heart without angina pectoris 06/09/2017  .  Abnormal nuclear cardiac imaging test 05/25/2017  . PVC's (premature ventricular contractions) 05/17/2017  . Anxiety 12/29/2016  . Vascular parkinsonism ([place]) 06/29/2016  . Gait disorder 04/07/2016  . Intracranial carotid stenosis, bilateral 02/27/2016  . Essential hypertension   . Left-sided weakness 12/28/2015  . Headache 12/28/2015  . Adjustment reaction with anxiety 12/20/2015  . Left hemiparesis ([place])   . History of CVA with residual deficit   . Prediabetes   . SVT (supraventricular tachycardia) ([place]) 12/14/2015  . Stroke-like symptom 12/13/2015  . History of stroke   . Heart palpitations 09/06/2012  . Hypertension   . Familial hyperlipidemia   . Hypothyroidism   . Vitamin D deficiency   . Palpitations   . History of dizziness   . LVH (left ventricular hypertrophy)   . Aortic stenosis   . Mitral regurgitation   . SOB (shortness of breath)   . Difficulty walking    Amador Cunas, [place], DPT Donald Prose Bekka Qian 07/11/2020, 2:43 PM  [address]. [place], [place], [address] Phone: [phone]   Fax:  [phone]  Name: Stacey Ramirez MRN: [id number] Date of Birth: 02-Sep-1940

## 2020-07-16 ENCOUNTER — Encounter: Payer: Self-pay | Admitting: Physical Therapy

## 2020-07-16 ENCOUNTER — Ambulatory Visit: Payer: PPO | Admitting: Physical Therapy

## 2020-07-16 DIAGNOSIS — R296 Repeated falls: Secondary | ICD-10-CM

## 2020-07-16 DIAGNOSIS — R2689 Other abnormalities of gait and mobility: Secondary | ICD-10-CM

## 2020-07-16 DIAGNOSIS — R262 Difficulty in walking, not elsewhere classified: Secondary | ICD-10-CM

## 2020-07-16 DIAGNOSIS — M6281 Muscle weakness (generalized): Secondary | ICD-10-CM

## 2020-07-16 NOTE — Therapy (Signed)
[place]. [place], [place], [address] Phone: [phone]   Fax:  [phone]  Physical Therapy Treatment  Patient Details  Name: Stacey Ramirez MRN: [id number] Date of Birth: May 24, 1941 Referring Provider (PT): Pharr   Encounter Date: 07/16/2020   PT End of Session - 07/16/20 1441    Visit Number 4    Date for PT Re-Evaluation 09/01/20    PT Start Time 1740    PT Stop Time 1436    PT Time Calculation (min) 47 min    Activity Tolerance Patient tolerated treatment well    Behavior During Therapy [facility] for tasks assessed/performed           Past Medical History:  Diagnosis Date  . Adjustment reaction with anxiety 12/20/2015  . Aortic stenosis   . CAD (coronary artery disease) 06/09/2017   Nuc study 10/18: EF 69, inferolateral perfusion defect-possible small infarct with peri-infarct ischemia; intermediate risk // LHC 10/18: pLAD 25, pLCx 50, pRCA 25 >> med Rx  . Carotid stenosis, asymptomatic, bilateral 02/27/2016   Carotid US 5/17: Bilateral ICA 1-39  . Cerebrovascular accident (stroke) ([place])    a. 12/2015 - cryptogenic.  S/P MDT Linq.  . Difficulty walking   . Essential hypertension   . Gait disorder 04/07/2016  . Heart palpitations 09/06/2012  . History of CVA with residual deficit 12/14/2015  . History of dizziness   . History of echocardiogram    Echo 10/18: Vigorous LVF, EF 65-70, normal wall motion, grade 1 diastolic dysfunction, GLS -21.1%, mild RAE  . History of nuclear stress test    Nuclear stress test 10/18: EF 69, inf-lat defect c/w poss infarct with peri-infarct ischemia; Intermediate Risk  . Hyperlipidemia   . Hypothyroidism   . Left hemiparesis ([place])   . LVH (left ventricular hypertrophy)   . Mitral regurgitation    a. 12/2015 Echo: EF 60-65%, mild focal basal hypertrophy. No rwma, triv AI, mild MR, mildly dil LA w/o thrombus. No RA thrombus. No PFO.  Marland Kitchen Palpitations   . Prediabetes   .  Stroke-like symptom 12/13/2015  . SVT (supraventricular tachycardia) ([place]) 12/14/2015  . Vascular parkinsonism ([place]) 06/29/2016  . Vitamin D deficiency     Past Surgical History:  Procedure Laterality Date  . CARDIOVASCULAR STRESS TEST  2002   NORMAL  . EP IMPLANTABLE DEVICE N/A 12/17/2015   Procedure: Loop Recorder Insertion;  Surgeon: [name], MD;  Location: [place] [facility];  Service: Cardiovascular;  Laterality: N/A;  . LEFT HEART CATH AND CORONARY ANGIOGRAPHY N/A 05/25/2017   Procedure: LEFT HEART CATH AND CORONARY ANGIOGRAPHY;  Surgeon: Sherren Mocha, MD;  Location: [place] [facility];  Service: Cardiovascular;  Laterality: N/A;  . LOOP RECORDER IMPLANT    . TEE WITHOUT CARDIOVERSION N/A 12/17/2015   Procedure: TRANSESOPHAGEAL ECHOCARDIOGRAM (TEE);  Surgeon: Lelon Perla, MD;  Location: [facility];  Service: Cardiovascular;  Laterality: N/A;  Pt also needs a LOOP  . TRANSTHORACIC ECHOCARDIOGRAM  2008   SHOWED LEFT VENTRICULAR HYPERTROPHY AND MILD AORTIC STENOSIS    There were no vitals filed for this visit.   Subjective Assessment - 07/16/20 1348    Subjective feeling good today    Currently in Pain? No/denies                             [facility] Adult PT Treatment/Exercise - 07/16/20 0001      Lumbar Exercises:  Stretches   Active Hamstring Stretch Right;Left;1 rep;20 seconds      Lumbar Exercises: Seated   Sit to Stand Limitations --    Other Seated Lumbar Exercises rows, shoulder ext green TB 2x10; yellow TB ER with scap retraction (limited ER ROM with LUE)      Knee/Hip Exercises: Aerobic   Nustep level 4 x 7 minutes      Knee/Hip Exercises: Machines for Strengthening   Cybex Knee Extension 5# BLE 2x10    Cybex Knee Flexion 20# BLE 2x10      Knee/Hip Exercises: Standing   Heel Raises Both;1 set;10 reps    Heel Raises Limitations 2    Hip Flexion Both;1 set;10 reps    Hip Flexion Limitations 2#    Hip Abduction Both;1 set;10 reps     Abduction Limitations 2#    Hip Extension Both;1 set;10 reps    Extension Limitations 2#      Knee/Hip Exercises: Seated   Sit to [place] 2 sets;5 reps;with UE support                    PT Short Term Goals - 07/16/20 1443      PT SHORT TERM GOAL #1   Title independent with her home HEP    Time 2    Period Weeks    Status Achieved             PT Long Term Goals - 07/02/20 1434      PT LONG TERM GOAL #1   Title TUG with RW 28 seconds    Time 12    Period Weeks    Status New      PT LONG TERM GOAL #2   Title increase Berg balance score to 40/56 for safety    Time 12    Period Weeks    Status New      PT LONG TERM GOAL #3   Title increase LE strength (particularly ABD and ext.) to 4/5 for functional gait and safety    Time 12    Period Weeks    Status New      PT LONG TERM GOAL #4   Title be able to do a sit to stand without assistance and without losing balance for functional use    Time 12    Period Weeks    Status New                 Plan - 07/16/20 1442    Clinical Impression Statement Pt requires frequent cuing to maintain upright posture; tendency to lean back with seated ex's. Verbal cuing for increased step length and foot clearance with gait. Continued reinforcement of education for foot placement with STS and to avoid locking knees on back of chair. Continue to progress strength/endurance.    PT Treatment/Interventions ADLs/Self Care Home Management;Gait training;Neuromuscular re-education;Balance training;Therapeutic exercise;Therapeutic activities;Functional mobility training;Stair training;Patient/family education;Manual techniques    PT Next Visit Plan progress exercises and walking/balance    Consulted and Agree with Plan of Care Patient           Patient will benefit from skilled therapeutic intervention in order to improve the following deficits and impairments:  Abnormal gait,Decreased range of motion,Difficulty  walking,Cardiopulmonary status limiting activity,Decreased endurance,Decreased activity tolerance,Decreased balance,Decreased mobility  Visit Diagnosis: Repeated falls  Muscle weakness (generalized)  Balance problem  Difficulty in walking, not elsewhere classified     Problem List Patient Active Problem List   Diagnosis Date Noted  .  Familial hypercholesteremia 02/21/2018  . Psychogenic gait 10/21/2017  . Coronary artery disease involving native coronary artery of native heart without angina pectoris 06/09/2017  . Abnormal nuclear cardiac imaging test 05/25/2017  . PVC's (premature ventricular contractions) 05/17/2017  . Anxiety 12/29/2016  . Vascular parkinsonism ([place]) 06/29/2016  . Gait disorder 04/07/2016  . Intracranial carotid stenosis, bilateral 02/27/2016  . Essential hypertension   . Left-sided weakness 12/28/2015  . Headache 12/28/2015  . Adjustment reaction with anxiety 12/20/2015  . Left hemiparesis ([place])   . History of CVA with residual deficit   . Prediabetes   . SVT (supraventricular tachycardia) ([place]) 12/14/2015  . Stroke-like symptom 12/13/2015  . History of stroke   . Heart palpitations 09/06/2012  . Hypertension   . Familial hyperlipidemia   . Hypothyroidism   . Vitamin D deficiency   . Palpitations   . History of dizziness   . LVH (left ventricular hypertrophy)   . Aortic stenosis   . Mitral regurgitation   . SOB (shortness of breath)   . Difficulty walking    Amador Cunas, [place], DPT Donald Prose Lesly Pontarelli 07/16/2020, 2:44 PM  [place]. [place], [place], [address] Phone: [phone]   Fax:  [phone]  Name: SHANEQUA WHITENIGHT MRN: [id number] Date of Birth: [birth year]

## 2020-07-18 ENCOUNTER — Other Ambulatory Visit: Payer: Self-pay

## 2020-07-18 ENCOUNTER — Ambulatory Visit: Payer: PPO | Admitting: Physical Therapy

## 2020-07-18 ENCOUNTER — Encounter: Payer: Self-pay | Admitting: Physical Therapy

## 2020-07-18 DIAGNOSIS — M6281 Muscle weakness (generalized): Secondary | ICD-10-CM

## 2020-07-18 DIAGNOSIS — R2689 Other abnormalities of gait and mobility: Secondary | ICD-10-CM

## 2020-07-18 DIAGNOSIS — R262 Difficulty in walking, not elsewhere classified: Secondary | ICD-10-CM

## 2020-07-18 DIAGNOSIS — R296 Repeated falls: Secondary | ICD-10-CM | POA: Diagnosis not present

## 2020-07-18 NOTE — Therapy (Signed)
[place]. [place], [place], [address] Phone: [phone]   Fax:  [phone]  Physical Therapy Treatment  Patient Details  Name: Stacey Ramirez MRN: [id number] Date of Birth: 1941/04/15 Referring Provider (PT): Pharr   Encounter Date: 07/18/2020   PT End of Session - 07/18/20 1451    Visit Number 5    Date for PT Re-Evaluation 09/01/20    PT Start Time 1400    PT Stop Time 1444    PT Time Calculation (min) 44 min    Activity Tolerance Patient tolerated treatment well    Behavior During Therapy [facility] for tasks assessed/performed           Past Medical History:  Diagnosis Date   Adjustment reaction with anxiety 12/20/2015   Aortic stenosis    CAD (coronary artery disease) 06/09/2017   Nuc study 10/18: EF 69, inferolateral perfusion defect-possible small infarct with peri-infarct ischemia; intermediate risk // LHC 10/18: pLAD 25, pLCx 51, pRCA 25 >> med Rx   Carotid stenosis, asymptomatic, bilateral 02/27/2016   Carotid US 5/17: Bilateral ICA 1-39   Cerebrovascular accident (stroke) ([place])    a. 12/2015 - cryptogenic.  S/P MDT Linq.   Difficulty walking    Essential hypertension    Gait disorder 04/07/2016   Heart palpitations 09/06/2012   History of CVA with residual deficit 12/14/2015   History of dizziness    History of echocardiogram    Echo 10/18: Vigorous LVF, EF 65-70, normal wall motion, grade 1 diastolic dysfunction, GLS -21.1%, mild RAE   History of nuclear stress test    Nuclear stress test 10/18: EF 69, inf-lat defect c/w poss infarct with peri-infarct ischemia; Intermediate Risk   Hyperlipidemia    Hypothyroidism    Left hemiparesis ([facility])    LVH (left ventricular hypertrophy)    Mitral regurgitation    a. 12/2015 Echo: EF 60-65%, mild focal basal hypertrophy. No rwma, triv AI, mild MR, mildly dil LA w/o thrombus. No RA thrombus. No PFO.   Palpitations    Prediabetes     Stroke-like symptom 12/13/2015   SVT (supraventricular tachycardia) ([place]) 12/14/2015   Vascular parkinsonism ([place]) 06/29/2016   Vitamin D deficiency     Past Surgical History:  Procedure Laterality Date   CARDIOVASCULAR STRESS TEST  2002   NORMAL   EP IMPLANTABLE DEVICE N/A 12/17/2015   Procedure: Loop Recorder Insertion;  Surgeon: [name], MD;  Location: [place] [facility];  Service: Cardiovascular;  Laterality: N/A;   LEFT HEART CATH AND CORONARY ANGIOGRAPHY N/A 05/25/2017   Procedure: LEFT HEART CATH AND CORONARY ANGIOGRAPHY;  Surgeon: Sherren Mocha, MD;  Location: [place] [facility];  Service: Cardiovascular;  Laterality: N/A;   LOOP RECORDER IMPLANT     TEE WITHOUT CARDIOVERSION N/A 12/17/2015   Procedure: TRANSESOPHAGEAL ECHOCARDIOGRAM (TEE);  Surgeon: Lelon Perla, MD;  Location: [place];  Service: Cardiovascular;  Laterality: N/A;  Pt also needs a LOOP   TRANSTHORACIC ECHOCARDIOGRAM  2008   SHOWED LEFT VENTRICULAR HYPERTROPHY AND MILD AORTIC STENOSIS    There were no vitals filed for this visit.   Subjective Assessment - 07/18/20 1406    Subjective Pt reports she is feeling well today.    Currently in Pain? No/denies                             [facility] Adult PT Treatment/Exercise - 07/18/20 0001  Ambulation/Gait   Gait Comments x 75 ft around clinic x2 [place] assist      Lumbar Exercises: Seated   Other Seated Lumbar Exercises iso abs with exercise ball x10 3 sec hold    Other Seated Lumbar Exercises seated chest press and overhead raise red ball      Knee/Hip Exercises: Aerobic   Nustep L4 x 7 min      Knee/Hip Exercises: Seated   Long Arc Quad Both;2 sets;10 reps;Weights;Strengthening    Long Arc Quad Weight 3 lbs.    Ball Squeeze 2x10    Marching 2 sets;10 reps;Weights    Marching Limitations 2.5#    Sit to [employer] 2 sets;5 reps;with UE support                    PT Short Term Goals - 07/16/20 1443      PT  SHORT TERM GOAL #1   Title independent with her home HEP    Time 2    Period Weeks    Status Achieved             PT Long Term Goals - 07/18/20 1453      PT LONG TERM GOAL #1   Title TUG with RW 28 seconds    Time 12    Period Weeks    Status On-going      PT LONG TERM GOAL #2   Title increase Berg balance score to 40/56 for safety    Time 12    Period Weeks    Status On-going      PT LONG TERM GOAL #3   Title increase LE strength (particularly ABD and ext.) to 4/5 for functional gait and safety    Time 12    Period Weeks    Status On-going      PT LONG TERM GOAL #4   Title be able to do a sit to stand without assistance and without losing balance for functional use    Time 12    Period Weeks    Status On-going                 Plan - 07/18/20 1452    Clinical Impression Statement Pt requires frequent tactile/verbal cuing to maintain upright posture with seated ex's. Tendency to lean back with exercise and demos core weakness. Cues for increased step length and foot clearance with gait. Able to ambulate with HH x2 no AD; occasional LOB with HH x1. Continue to progress strength/endurance.    PT Treatment/Interventions ADLs/Self Care Home Management;Gait training;Neuromuscular re-education;Balance training;Therapeutic exercise;Therapeutic activities;Functional mobility training;Stair training;Patient/family education;Manual techniques    PT Next Visit Plan progress exercises and walking/balance    Consulted and Agree with Plan of Care Patient           Patient will benefit from skilled therapeutic intervention in order to improve the following deficits and impairments:  Abnormal gait,Decreased range of motion,Difficulty walking,Cardiopulmonary status limiting activity,Decreased endurance,Decreased activity tolerance,Decreased balance,Decreased mobility  Visit Diagnosis: Repeated falls  Muscle weakness (generalized)  Balance problem  Difficulty in walking,  not elsewhere classified     Problem List Patient Active Problem List   Diagnosis Date Noted   Familial hypercholesteremia 02/21/2018   Psychogenic gait 10/21/2017   Coronary artery disease involving native coronary artery of native heart without angina pectoris 06/09/2017   Abnormal nuclear cardiac imaging test 05/25/2017   PVC's (premature ventricular contractions) 05/17/2017   Anxiety 12/29/2016   Vascular parkinsonism ([place]) 06/29/2016  Gait disorder 04/07/2016   Intracranial carotid stenosis, bilateral 02/27/2016   Essential hypertension    Left-sided weakness 12/28/2015   Headache 12/28/2015   Adjustment reaction with anxiety 12/20/2015   Left hemiparesis ([place])    History of CVA with residual deficit    Prediabetes    SVT (supraventricular tachycardia) ([place]) 12/14/2015   Stroke-like symptom 12/13/2015   History of stroke    Heart palpitations 09/06/2012   Hypertension    Familial hyperlipidemia    Hypothyroidism    Vitamin D deficiency    Palpitations    History of dizziness    LVH (left ventricular hypertrophy)    Aortic stenosis    Mitral regurgitation    SOB (shortness of breath)    Difficulty walking    Amador Cunas, PT, DPT Donald Prose Cira Deyoe 07/18/2020, 2:55 PM  [address] [place], [place], [address] Phone: [phone]   Fax:  [phone]  Name: Stacey Ramirez MRN: [id number] Date of Birth: 05/29/1941

## 2020-07-23 ENCOUNTER — Ambulatory Visit (HOSPITAL_COMMUNITY)
Admission: RE | Admit: 2020-07-23 | Discharge: 2020-07-23 | Disposition: A | Discharge status: Home / Self Care | Payer: PPO | Source: Ambulatory Visit | Attending: Internal Medicine | Admitting: Internal Medicine

## 2020-07-23 ENCOUNTER — Ambulatory Visit: Payer: PPO | Admitting: Physical Therapy

## 2020-07-23 ENCOUNTER — Other Ambulatory Visit (HOSPITAL_COMMUNITY): Payer: Self-pay | Admitting: Internal Medicine

## 2020-07-23 ENCOUNTER — Other Ambulatory Visit: Payer: Self-pay

## 2020-07-23 DIAGNOSIS — S92355A Nondisplaced fracture of fifth metatarsal bone, left foot, initial encounter for closed fracture: Secondary | ICD-10-CM | POA: Diagnosis not present

## 2020-07-23 DIAGNOSIS — M79672 Pain in left foot: Secondary | ICD-10-CM | POA: Diagnosis not present

## 2020-07-23 DIAGNOSIS — L03116 Cellulitis of left lower limb: Secondary | ICD-10-CM | POA: Diagnosis not present

## 2020-07-23 DIAGNOSIS — R600 Localized edema: Secondary | ICD-10-CM | POA: Insufficient documentation

## 2020-07-23 DIAGNOSIS — M7989 Other specified soft tissue disorders: Secondary | ICD-10-CM | POA: Diagnosis not present

## 2020-07-23 DIAGNOSIS — Z23 Encounter for immunization: Secondary | ICD-10-CM | POA: Diagnosis not present

## 2020-07-24 DIAGNOSIS — Z9989 Dependence on other enabling machines and devices: Secondary | ICD-10-CM | POA: Diagnosis not present

## 2020-07-24 DIAGNOSIS — W19XXXA Unspecified fall, initial encounter: Secondary | ICD-10-CM | POA: Diagnosis not present

## 2020-07-24 DIAGNOSIS — I700 Atherosclerosis of aorta: Secondary | ICD-10-CM | POA: Diagnosis not present

## 2020-07-24 DIAGNOSIS — R2681 Unsteadiness on feet: Secondary | ICD-10-CM | POA: Diagnosis not present

## 2020-07-24 DIAGNOSIS — R0781 Pleurodynia: Secondary | ICD-10-CM | POA: Diagnosis not present

## 2020-07-25 ENCOUNTER — Ambulatory Visit: Payer: PPO | Admitting: Physical Therapy

## 2020-07-25 DIAGNOSIS — M2042 Other hammer toe(s) (acquired), left foot: Secondary | ICD-10-CM | POA: Diagnosis not present

## 2020-07-25 DIAGNOSIS — M79672 Pain in left foot: Secondary | ICD-10-CM | POA: Diagnosis not present

## 2020-07-30 ENCOUNTER — Ambulatory Visit: Payer: PPO | Admitting: Physical Therapy

## 2020-08-01 ENCOUNTER — Ambulatory Visit: Payer: PPO | Admitting: Physical Therapy

## 2020-12-04 ENCOUNTER — Other Ambulatory Visit: Payer: Self-pay | Admitting: Cardiovascular Disease

## 2020-12-09 ENCOUNTER — Other Ambulatory Visit: Payer: Self-pay | Admitting: Cardiovascular Disease

## 2021-01-02 ENCOUNTER — Other Ambulatory Visit: Payer: Self-pay | Admitting: Cardiovascular Disease

## 2021-01-08 ENCOUNTER — Other Ambulatory Visit: Payer: Self-pay | Admitting: Cardiovascular Disease

## 2021-01-14 NOTE — Telephone Encounter (Signed)
There are no VV's available, I can ask one of the APP's  if they are willing to do a VV in an OV slot but the next available appt is not until 09/20, will that be OK?

## 2021-01-16 MED ORDER — DILTIAZEM HCL ER COATED BEADS 180 MG PO CP24: 180.0000 mg | ORAL_CAPSULE | Freq: Every day | ORAL | 0 refills | Status: DC

## 2021-01-22 ENCOUNTER — Other Ambulatory Visit: Payer: Self-pay | Admitting: Cardiovascular Disease

## 2021-02-03 ENCOUNTER — Other Ambulatory Visit: Payer: Self-pay | Admitting: Cardiovascular Disease

## 2021-02-04 MED ORDER — NEBIVOLOL HCL 2.5 MG PO TABS: ORAL_TABLET | ORAL | 0 refills | Status: DC

## 2021-04-15 ENCOUNTER — Telehealth: Payer: Self-pay | Admitting: *Deleted

## 2021-04-15 ENCOUNTER — Telehealth: Payer: PPO | Admitting: Physician Assistant

## 2021-04-15 NOTE — Progress Notes (Signed)
Virtual Visit via Video Note   This visit type was conducted due to national recommendations for restrictions regarding the COVID-19 Pandemic (e.g. social distancing) in an effort to limit this patient's exposure and mitigate transmission in our community.  Due to her co-morbid illnesses, this patient is at least at moderate risk for complications without adequate follow up.  This format is felt to be most appropriate for this patient at this time.  All issues noted in this document were discussed and addressed.  A limited physical exam was performed with this format.  Please refer to the patient's chart for her consent to telehealth for [facility].     Date:  04/16/2021   ID:  Stacey Ramirez, DOB 08/19/1940, MRN [id number] The patient was identified using 2 identifiers.  Patient Location: Home Provider Location: Office/Clinic   PCP:  Stacey Pretty, MD   [place] Providers Cardiologist:  Stacey Moores, MD Cardiology APP:  Stacey Shi, PA-C     Evaluation Performed:  Follow-Up Visit  Chief Complaint:  F/u for HTN, CAD   Patient Profile: Stacey Ramirez is a 80 y.o. female with: Coronary artery disease Cath 2018: non-obstructive Palpitations Hypertension  Hyperlipidemia  Hx of CVA S/p ILR PVCs Monitor in 2018: 5% burden SVT (nonsustained on monitor in 2018) Carotid artery disease [place] in 2017: 1-39 bilat ICA Hypothyroidism    Prior CV studies:  Cardiac catheterization 05/25/17 LAD proximal 25 LCx proximal 50 RCA proximal 25   Holter monitor 05/04/17 Normal sinus rhythm, frequent PVCs with total burden 5%, occasional NSVT, rare episodes of SVT   Nuclear stress test 05/05/17 EF 69, inferolateral perfusion defect-possible small infarct with peri-infarct ischemia; intermediate risk   Echocardiogram 05/05/17 Vigorous LVEF, EF 65-70, normal wall motion, grade 1 diastolic dysfunction, GLS -21.1, mild RAE   TEE 12/17/15 Mild focal basal septal hypertrophy, EF  60-65, normal wall motion, trivial AI, mild MR, no LAA clot   Echo 12/13/15 Mild LVH, EF 60-65, normal wall motion, grade 1 diastolic dysfunction, normal RVSF   Carotid US 12/13/15 Bilateral ICA 1-39   Echo 9/14 Mild LVH, EF [id number], grade 1 diastolic dysfunction   History of Present Illness:   Stacey Ramirez was last seen in clinic by Stacey Ramirez in 4/21.  She is seen for f/u.  She is seen along with her husband.  She has a hard time getting out and her mobility is poor.  She has Parkinsonism.  Therefore, she is seen virtually today.  She has not had chest pain, shortness of breath, syncope, leg edema, orthopnea.    Past Medical History:  Diagnosis Date   Adjustment reaction with anxiety 12/20/2015   Aortic stenosis    CAD (coronary artery disease) 06/09/2017   Nuc study 10/18: EF 69, inferolateral perfusion defect-possible small infarct with peri-infarct ischemia; intermediate risk // LHC 10/18: pLAD 25, pLCx 31, pRCA 25 >> med Rx   Carotid stenosis, asymptomatic, bilateral 02/27/2016   Carotid US 5/17: Bilateral ICA 1-39   Cerebrovascular accident (stroke) ([place])    a. 12/2015 - cryptogenic.  S/P MDT Linq.   Difficulty walking    Essential hypertension    Gait disorder 04/07/2016   Heart palpitations 09/06/2012   History of CVA with residual deficit 12/14/2015   History of dizziness    History of echocardiogram    Echo 10/18: Vigorous LVF, EF 65-70, normal wall motion, grade 1 diastolic dysfunction, GLS -21.1%, mild RAE   History of nuclear stress test  Nuclear stress test 10/18: EF 69, inf-lat defect c/w poss infarct with peri-infarct ischemia; Intermediate Risk   Hyperlipidemia    Hypothyroidism    Left hemiparesis ([facility])    LVH (left ventricular hypertrophy)    Mitral regurgitation    a. 12/2015 Echo: EF 60-65%, mild focal basal hypertrophy. No rwma, triv AI, mild MR, mildly dil LA w/o thrombus. No RA thrombus. No PFO.   Palpitations    Prediabetes    Stroke-like symptom 12/13/2015    SVT (supraventricular tachycardia) ([place]) 12/14/2015   Vascular parkinsonism ([place]) 06/29/2016   Vitamin D deficiency    Past Surgical History:  Procedure Laterality Date   CARDIOVASCULAR STRESS TEST  2002   NORMAL   EP IMPLANTABLE DEVICE N/A 12/17/2015   Procedure: Loop Recorder Insertion;  Surgeon: [name], MD;  Location: [place] [facility];  Service: Cardiovascular;  Laterality: N/A;   LEFT HEART CATH AND CORONARY ANGIOGRAPHY N/A 05/25/2017   Procedure: LEFT HEART CATH AND CORONARY ANGIOGRAPHY;  Surgeon: Sherren Mocha, MD;  Location: [place] [facility];  Service: Cardiovascular;  Laterality: N/A;   LOOP RECORDER IMPLANT     TEE WITHOUT CARDIOVERSION N/A 12/17/2015   Procedure: TRANSESOPHAGEAL ECHOCARDIOGRAM (TEE);  Surgeon: Lelon Perla, MD;  Location: [facility]COPY;  Service: Cardiovascular;  Laterality: N/A;  Pt also needs a LOOP   TRANSTHORACIC ECHOCARDIOGRAM  2008   SHOWED LEFT VENTRICULAR HYPERTROPHY AND MILD AORTIC STENOSIS     Current Meds  Medication Sig   ALPRAZolam (XANAX) 0.25 MG tablet Take 0.25 mg by mouth at bedtime as needed for anxiety or sleep.   carboxymethylcellulose (REFRESH PLUS) 0.5 % SOLN Place 1 drop into both eyes 3 (three) times daily as needed (dry eyes).    Cholecalciferol (VITAMIN D) 2000 units tablet Take 2,000 Units by mouth daily.   clopidogrel (PLAVIX) 75 MG tablet Take 1 tablet (75 mg total) by mouth daily.   diltiazem (CARDIZEM CD) 180 MG 24 hr capsule Take 1 capsule (180 mg total) by mouth daily.   docusate sodium (COLACE) 100 MG capsule Take 200 mg by mouth daily.   irbesartan (AVAPRO) 300 MG tablet Take 300 mg by mouth daily.    levothyroxine (SYNTHROID, LEVOTHROID) 25 MCG tablet Take 25 mcg by mouth daily before breakfast.    Multiple Vitamin (MULTIVITAMIN) tablet Take 1 tablet by mouth daily.   nebivolol (BYSTOLIC) 2.5 MG tablet TAKE 1 TABLET(2.5 MG) BY MOUTH DAILY   sertraline (ZOLOFT) 100 MG tablet Take 1 tablet by mouth daily.      Allergies:   Sulfa drugs cross reactors, Zocor [simvastatin], Crestor [rosuvastatin calcium], Statins, Sulfa antibiotics, and Micardis [telmisartan]   Social History   Tobacco Use   Smoking status: Never   Smokeless tobacco: Never  Vaping Use   Vaping Use: Never used  Substance Use Topics   Alcohol use: Yes    Alcohol/week: 1.0 standard drink    Types: 1 Glasses of wine per week    Comment: socially   Drug use: No     Family Hx: The patient's family history includes Alzheimer's disease in her mother; Heart attack in her mother; Stroke in her paternal uncle.  ROS:   Please see the history of present illness.     Labs/Other Tests and Data Reviewed:    EKG:  No ECG reviewed.  Recent Labs: No results found for requested labs within last 8760 hours.   Recent Lipid Panel Lab Results  Component Value Date/Time   CHOL 385 (H) 09/13/2017  02:53 PM   TRIG 183 (H) 09/13/2017 02:53 PM   HDL 47 09/13/2017 02:53 PM   CHOLHDL 8.2 (H) 09/13/2017 02:53 PM   CHOLHDL 4.8 12/13/2015 03:58 PM   LDLCALC 301 (H) 09/13/2017 02:53 PM   LDLDIRECT 224.6 12/14/2011 09:19 AM    Wt Readings from Last 3 Encounters:  04/16/21 122 lb (55.3 kg)  11/10/19 126 lb 8 oz (57.4 kg)  08/10/19 123 lb 12.8 oz (56.2 kg)     Risk Assessment/Calculations:          Objective:    Vital Signs:  BP 120/86   Pulse 75   Ht '5\' 2"'$  (1.575 m)   Wt 122 lb (55.3 kg)   BMI 22.31 kg/m    VITAL SIGNS:  reviewed GEN:  no acute distress RESPIRATORY:  normal respiratory effort, symmetric expansion PSYCH:  normal affect  ASSESSMENT & PLAN:    1. Essential hypertension BP well controlled on nebivolol, diltiazem, irbesartan.  F/u in 1 year.  She has difficulty getting out to any appt.  Will see if we can refer her to [facility].    2. Coronary artery disease involving native coronary artery of native heart without angina pectoris Non-obstructive by cath in 2018.  She remains on clopidogrel.  She cannot  tolerate statins.   3. Hyperlipidemia, unspecified hyperlipidemia type Difficult to control.  Last LDL > 200.  She has seen Dr. Debara Pickett in the past.  I will reach out to him to see if Inclisiran could be considered for her.   4. History of CVA with residual deficit S/p ILR.  No AFib detected.       Time:   Today, I have spent 12 minutes with the patient with telehealth technology discussing the above problems.     Medication Adjustments/Labs and Tests Ordered: Current medicines are reviewed at length with the patient today.  Concerns regarding medicines are outlined above.   Tests Ordered: No orders of the defined types were placed in this encounter.   Medication Changes: No orders of the defined types were placed in this encounter.   Follow Up:   in 1 year(s)  Signed, Richardson Dopp, PA-C  04/16/2021 12:22 PM    [facility]

## 2021-04-15 NOTE — Telephone Encounter (Signed)
S/w pt and pt's spouse.  Moved VT visit to tomorrow with Nicki Reaper at 12:15 due to provider being out sick today. If pt cannot keep appt with send mychart message.

## 2021-04-16 ENCOUNTER — Encounter: Payer: Self-pay | Admitting: Physician Assistant

## 2021-04-16 ENCOUNTER — Telehealth (INDEPENDENT_AMBULATORY_CARE_PROVIDER_SITE_OTHER): Payer: PPO | Admitting: Physician Assistant

## 2021-04-16 VITALS — BP 120/86 | HR 75 | Ht 62.0 in | Wt 122.0 lb

## 2021-04-16 DIAGNOSIS — I10 Essential (primary) hypertension: Secondary | ICD-10-CM | POA: Diagnosis not present

## 2021-04-16 DIAGNOSIS — I6930 Unspecified sequelae of cerebral infarction: Secondary | ICD-10-CM

## 2021-04-16 DIAGNOSIS — E785 Hyperlipidemia, unspecified: Secondary | ICD-10-CM

## 2021-04-16 DIAGNOSIS — I2510 Atherosclerotic heart disease of native coronary artery without angina pectoris: Secondary | ICD-10-CM | POA: Diagnosis not present

## 2021-04-16 DIAGNOSIS — I493 Ventricular premature depolarization: Secondary | ICD-10-CM

## 2021-04-16 MED ORDER — NEBIVOLOL HCL 2.5 MG PO TABS: ORAL_TABLET | ORAL | 0 refills | Status: DC

## 2021-04-16 MED ORDER — DILTIAZEM HCL ER COATED BEADS 180 MG PO CP24: 180.0000 mg | ORAL_CAPSULE | Freq: Every day | ORAL | 3 refills | Status: DC

## 2021-04-16 NOTE — Patient Instructions (Signed)
Medication Instructions:  Your physician recommends that you continue on your current medications as directed. Please refer to the Current Medication list given to you today.  *If you need a refill on your cardiac medications before your next appointment, please call your pharmacy*   Lab Work: NONE If you have labs (blood work) drawn today and your tests are completely normal, you will receive your results only by: [place] (if you have MyChart) OR A paper copy in the mail If you have any lab test that is abnormal or we need to change your treatment, we will call you to review the results.   Testing/Procedures: NONE   Follow-Up: At [facility], you and your health needs are our priority.  As part of our continuing mission to provide you with exceptional heart care, we have created designated Provider Care Teams.  These Care Teams include your primary Cardiologist (physician) and Advanced Practice Providers (APPs -  Physician Assistants and Nurse Practitioners) who all work together to provide you with the care you need, when you need it.  We recommend signing up for the patient portal called "MyChart".  Sign up information is provided on this After Visit Summary.  MyChart is used to connect with patients for Virtual Visits (Telemedicine).  Patients are able to view lab/test results, encounter notes, upcoming appointments, etc.  Non-urgent messages can be sent to your provider as well.   To learn more about what you can do with MyChart, go to [email or web address].    Your next appointment:   1 year(s)  The format for your next appointment:   In Person  Provider:   You may see Mertie Moores, MD or one of the following Advanced Practice Providers on your designated Care Team:   Richardson Dopp, PA-C [place], [place]

## 2021-04-18 ENCOUNTER — Other Ambulatory Visit: Payer: Self-pay | Admitting: *Deleted

## 2021-04-18 MED ORDER — IRBESARTAN 300 MG PO TABS: 300.0000 mg | ORAL_TABLET | Freq: Every day | ORAL | 3 refills | Status: DC

## 2021-04-18 MED ORDER — CLOPIDOGREL BISULFATE 75 MG PO TABS: 75.0000 mg | ORAL_TABLET | Freq: Every day | ORAL | 3 refills | Status: AC

## 2021-04-22 ENCOUNTER — Other Ambulatory Visit: Payer: Self-pay | Admitting: Cardiovascular Disease

## 2021-05-02 ENCOUNTER — Telehealth: Payer: Self-pay | Admitting: Internal Medicine

## 2021-05-02 NOTE — Telephone Encounter (Signed)
Left message to call back to discuss Leqvio 284mg /1.38ml -- MyChart message also sent   Received staff message communication from Kathleen Argue PA/Dr. Debara Pickett regarding this  Med administration schedule 1st dose, then 3 months later 2nd dose, then every 6 months thereafter  Given at [facility], infusion clinic - appointment has to be scheduled  Need to complete benefits investigation via portal -- need copy of 2022 insurance card and medicare card -- need patient e-mail for her to sign [email or web address] verification after portal enrollment   Once benefits verified, will be able to determine coverage, deductible, co-insurance, and if PA is required

## 2021-06-04 NOTE — Telephone Encounter (Signed)
Left message to call back to discuss Leqvio

## 2021-07-24 ENCOUNTER — Emergency Department (HOSPITAL_COMMUNITY): Payer: PPO

## 2021-07-24 ENCOUNTER — Emergency Department (HOSPITAL_COMMUNITY)
Admission: EM | Admit: 2021-07-24 | Discharge: 2021-07-25 | Disposition: A | Discharge status: Home / Self Care | Payer: PPO | Attending: Emergency Medicine | Admitting: Emergency Medicine

## 2021-07-24 DIAGNOSIS — S0990XA Unspecified injury of head, initial encounter: Secondary | ICD-10-CM | POA: Diagnosis not present

## 2021-07-24 DIAGNOSIS — R0902 Hypoxemia: Secondary | ICD-10-CM | POA: Diagnosis not present

## 2021-07-24 DIAGNOSIS — I10 Essential (primary) hypertension: Secondary | ICD-10-CM | POA: Diagnosis not present

## 2021-07-24 DIAGNOSIS — E039 Hypothyroidism, unspecified: Secondary | ICD-10-CM | POA: Insufficient documentation

## 2021-07-24 DIAGNOSIS — M79622 Pain in left upper arm: Secondary | ICD-10-CM | POA: Diagnosis not present

## 2021-07-24 DIAGNOSIS — M542 Cervicalgia: Secondary | ICD-10-CM | POA: Diagnosis not present

## 2021-07-24 DIAGNOSIS — M79632 Pain in left forearm: Secondary | ICD-10-CM | POA: Diagnosis not present

## 2021-07-24 DIAGNOSIS — W19XXXA Unspecified fall, initial encounter: Secondary | ICD-10-CM

## 2021-07-24 DIAGNOSIS — Z7902 Long term (current) use of antithrombotics/antiplatelets: Secondary | ICD-10-CM | POA: Insufficient documentation

## 2021-07-24 DIAGNOSIS — W0110XA Fall on same level from slipping, tripping and stumbling with subsequent striking against unspecified object, initial encounter: Secondary | ICD-10-CM | POA: Insufficient documentation

## 2021-07-24 DIAGNOSIS — I2510 Atherosclerotic heart disease of native coronary artery without angina pectoris: Secondary | ICD-10-CM | POA: Diagnosis not present

## 2021-07-24 DIAGNOSIS — Z8673 Personal history of transient ischemic attack (TIA), and cerebral infarction without residual deficits: Secondary | ICD-10-CM | POA: Insufficient documentation

## 2021-07-24 DIAGNOSIS — S0003XA Contusion of scalp, initial encounter: Secondary | ICD-10-CM | POA: Diagnosis not present

## 2021-07-24 DIAGNOSIS — Y92009 Unspecified place in unspecified non-institutional (private) residence as the place of occurrence of the external cause: Secondary | ICD-10-CM | POA: Insufficient documentation

## 2021-07-24 DIAGNOSIS — Z79899 Other long term (current) drug therapy: Secondary | ICD-10-CM | POA: Insufficient documentation

## 2021-07-24 DIAGNOSIS — M25539 Pain in unspecified wrist: Secondary | ICD-10-CM | POA: Diagnosis not present

## 2021-07-24 DIAGNOSIS — R102 Pelvic and perineal pain: Secondary | ICD-10-CM | POA: Diagnosis not present

## 2021-07-24 DIAGNOSIS — Z951 Presence of aortocoronary bypass graft: Secondary | ICD-10-CM | POA: Diagnosis not present

## 2021-07-24 DIAGNOSIS — R220 Localized swelling, mass and lump, head: Secondary | ICD-10-CM | POA: Diagnosis not present

## 2021-07-24 DIAGNOSIS — R079 Chest pain, unspecified: Secondary | ICD-10-CM | POA: Diagnosis not present

## 2021-07-24 DIAGNOSIS — M189 Osteoarthritis of first carpometacarpal joint, unspecified: Secondary | ICD-10-CM | POA: Diagnosis not present

## 2021-07-24 DIAGNOSIS — S199XXA Unspecified injury of neck, initial encounter: Secondary | ICD-10-CM | POA: Diagnosis not present

## 2021-07-24 LAB — COMPREHENSIVE METABOLIC PANEL
ALT: 18 U/L (ref 0–44)
AST: 25 U/L (ref 15–41)
Albumin: 4.1 g/dL (ref 3.5–5.0)
Alkaline Phosphatase: 85 U/L (ref 38–126)
Anion gap: 10 (ref 5–15)
BUN: 18 mg/dL (ref 8–23)
CO2: 24 mmol/L (ref 22–32)
Calcium: 10.2 mg/dL (ref 8.9–10.3)
Chloride: 104 mmol/L (ref 98–111)
Creatinine, Ser: 1.08 mg/dL — ABNORMAL HIGH (ref 0.44–1.00)
GFR, Estimated: 52 mL/min — ABNORMAL LOW (ref >60)
Glucose, Bld: 139 mg/dL — ABNORMAL HIGH (ref 70–99)
Potassium: 4.1 mmol/L (ref 3.5–5.1)
Sodium: 138 mmol/L (ref 135–145)
Total Bilirubin: 0.5 mg/dL (ref 0.3–1.2)
Total Protein: 7.5 g/dL (ref 6.5–8.1)

## 2021-07-24 LAB — CBC
HCT: 47.1 % — ABNORMAL HIGH (ref 36.0–46.0)
Hemoglobin: 15.6 g/dL — ABNORMAL HIGH (ref 12.0–15.0)
MCH: 31.8 pg (ref 26.0–34.0)
MCHC: 33.1 g/dL (ref 30.0–36.0)
MCV: 95.9 fL (ref 80.0–100.0)
Platelets: 241 10*3/uL (ref 150–400)
RBC: 4.91 MIL/uL (ref 3.87–5.11)
RDW: 12.2 % (ref 11.5–15.5)
WBC: 12 10*3/uL — ABNORMAL HIGH (ref 4.0–10.5)
nRBC: 0 % (ref 0.0–0.2)

## 2021-07-24 LAB — TROPONIN I (HIGH SENSITIVITY): Troponin I (High Sensitivity): 10 ng/L

## 2021-07-24 MED ORDER — HYDROCODONE-ACETAMINOPHEN 5-325 MG PO TABS
2.0000 | ORAL_TABLET | Freq: Once | ORAL | Status: AC
  Administered 2021-07-24: 2 via ORAL
  Filled 2021-07-24: qty 2

## 2021-07-24 MED ORDER — HYDROCODONE-ACETAMINOPHEN 5-325 MG PO TABS: 1.0000 | ORAL_TABLET | Freq: Four times a day (QID) | ORAL | 0 refills | Status: DC | PRN

## 2021-07-24 NOTE — ED Notes (Signed)
Pt has hematoma/abrasion to occipital scalp, L wrist deformity

## 2021-07-24 NOTE — ED Provider Notes (Signed)
[place] EMERGENCY DEPARTMENT Provider Note   CSN: [id number] Arrival date & time: 07/24/21  2214     History Chief Complaint  Patient presents with   Fall    On thinners    MAISA BEDINGFIELD is a 80 y.o. female hx of CAD, hypertension, previous stroke on plavix, here with fall.  Patient states that she was at home and she was using her walker and may have slipped and fell and hit her head.  Patient denies passing out.  She states that she remembers everything.  She denies any chest pain. She has some neck pain and also left upper extremity pain.   The history is provided by the patient.      Past Medical History:  Diagnosis Date   Adjustment reaction with anxiety 12/20/2015   Aortic stenosis    CAD (coronary artery disease) 06/09/2017   Nuc study 10/18: EF 69, inferolateral perfusion defect-possible small infarct with peri-infarct ischemia; intermediate risk // LHC 10/18: pLAD 25, pLCx 57, pRCA 25 >> med Rx   Carotid stenosis, asymptomatic, bilateral 02/27/2016   Carotid US 5/17: Bilateral ICA 1-39   Cerebrovascular accident (stroke) ([place])    a. 12/2015 - cryptogenic.  S/P MDT Linq.   Difficulty walking    Essential hypertension    Gait disorder 04/07/2016   Heart palpitations 09/06/2012   History of CVA with residual deficit 12/14/2015   History of dizziness    History of echocardiogram    Echo 10/18: Vigorous LVF, EF 65-70, normal wall motion, grade 1 diastolic dysfunction, GLS -21.1%, mild RAE   History of nuclear stress test    Nuclear stress test 10/18: EF 69, inf-lat defect c/w poss infarct with peri-infarct ischemia; Intermediate Risk   Hyperlipidemia    Hypothyroidism    Left hemiparesis (HCC)    LVH (left ventricular hypertrophy)    Mitral regurgitation    a. 12/2015 Echo: EF 60-65%, mild focal basal hypertrophy. No rwma, triv AI, mild MR, mildly dil LA w/o thrombus. No RA thrombus. No PFO.   Palpitations    Prediabetes    Stroke-like symptom  12/13/2015   SVT (supraventricular tachycardia) ([place]) 12/14/2015   Vascular parkinsonism ([place]) 06/29/2016   Vitamin D deficiency     Patient Active Problem List   Diagnosis Date Noted   Familial hypercholesteremia 02/21/2018   Psychogenic gait 10/21/2017   Coronary artery disease involving native coronary artery of native heart without angina pectoris 06/09/2017   Abnormal nuclear cardiac imaging test 05/25/2017   PVC's (premature ventricular contractions) 05/17/2017   Anxiety 12/29/2016   Vascular parkinsonism ([place]) 06/29/2016   Gait disorder 04/07/2016   Intracranial carotid stenosis, bilateral 02/27/2016   Essential hypertension    Left-sided weakness 12/28/2015   Headache 12/28/2015   Adjustment reaction with anxiety 12/20/2015   Left hemiparesis ([place])    History of CVA with residual deficit    Prediabetes    SVT (supraventricular tachycardia) ([place]) 12/14/2015   Stroke-like symptom 12/13/2015   History of stroke    Heart palpitations 09/06/2012   Hypertension    Familial hyperlipidemia    Hypothyroidism    Vitamin D deficiency    Palpitations    History of dizziness    LVH (left ventricular hypertrophy)    Aortic stenosis    Mitral regurgitation    SOB (shortness of breath)    Difficulty walking     Past Surgical History:  Procedure Laterality Date   CARDIOVASCULAR STRESS TEST  2002  NORMAL   EP IMPLANTABLE DEVICE N/A 12/17/2015   Procedure: Loop Recorder Insertion;  Surgeon: [name], MD;  Location: [place] [facility];  Service: Cardiovascular;  Laterality: N/A;   LEFT HEART CATH AND CORONARY ANGIOGRAPHY N/A 05/25/2017   Procedure: LEFT HEART CATH AND CORONARY ANGIOGRAPHY;  Surgeon: Sherren Mocha, MD;  Location: [facility];  Service: Cardiovascular;  Laterality: N/A;   LOOP RECORDER IMPLANT     TEE WITHOUT CARDIOVERSION N/A 12/17/2015   Procedure: TRANSESOPHAGEAL ECHOCARDIOGRAM (TEE);  Surgeon: Lelon Perla, MD;  Location: [facility] ENDOSCOPY;  Service:  Cardiovascular;  Laterality: N/A;  Pt also needs a LOOP   TRANSTHORACIC ECHOCARDIOGRAM  2008   SHOWED LEFT VENTRICULAR HYPERTROPHY AND MILD AORTIC STENOSIS     OB History   No obstetric history on file.     Family History  Problem Relation Age of Onset   Heart attack Mother    Alzheimer's disease Mother    Stroke Paternal Uncle     Social History   Tobacco Use   Smoking status: Never   Smokeless tobacco: Never  Vaping Use   Vaping Use: Never used  Substance Use Topics   Alcohol use: Yes    Alcohol/week: 1.0 standard drink    Types: 1 Glasses of wine per week    Comment: socially   Drug use: No    Home Medications Prior to Admission medications   Medication Sig Start Date End Date Taking? Authorizing Provider  ALPRAZolam Duanne Moron) 0.25 MG tablet Take 0.25 mg by mouth at bedtime as needed for anxiety or sleep.    [provider]  Calcium Citrate (CITRACAL PO) Take by mouth daily.    [provider]  carboxymethylcellulose (REFRESH PLUS) 0.5 % SOLN Place 1 drop into both eyes 3 (three) times daily as needed (dry eyes).     [provider]  Cholecalciferol (VITAMIN D) 2000 units tablet Take 2,000 Units by mouth daily.    [provider]  clopidogrel (PLAVIX) 75 MG tablet Take 1 tablet (75 mg total) by mouth daily. 04/18/21 04/18/22  Richardson Dopp T, PA-C  diltiazem (CARDIZEM CD) 180 MG 24 hr capsule TAKE 1 CAPSULE(180 MG) BY MOUTH DAILY. PLEASE MAKE OVERDUE APPOINTMENT WITH DOCTOR NAHSER BEFORE ANYMORE REFILLS. THANK YOU 2 ND ATTEMPT 04/22/21   Nahser, Wonda Cheng, MD  docusate sodium (COLACE) 100 MG capsule Take 200 mg by mouth daily.    [provider]  irbesartan (AVAPRO) 300 MG tablet Take 1 tablet (300 mg total) by mouth daily. 04/18/21 04/18/22  Richardson Dopp T, PA-C  levothyroxine (SYNTHROID, LEVOTHROID) 25 MCG tablet Take 25 mcg by mouth daily before breakfast.  09/21/14   [provider]  Multiple Vitamin (MULTIVITAMIN)  tablet Take 1 tablet by mouth daily.    [provider]  nebivolol (BYSTOLIC) 2.5 MG tablet TAKE 1 TABLET(2.5 MG) BY MOUTH DAILY 04/16/21   Richardson Dopp T, PA-C  sertraline (ZOLOFT) 100 MG tablet Take 1 tablet by mouth daily. 08/23/18   [provider]  trospium (SANCTURA) 20 MG tablet Take 20 mg by mouth 2 (two) times daily. 10/24/19   [provider]    Allergies    Sulfa drugs cross reactors, Zocor [simvastatin], Crestor [rosuvastatin calcium], Statins, Sulfa antibiotics, and Micardis [telmisartan]  Review of Systems   Review of Systems  HENT:         Scalp hematoma   Musculoskeletal:        L upper extremity pain   All  other systems reviewed and are negative.  Physical Exam Updated Vital Signs BP (!) 185/83    Pulse 80    Temp 98.5 F (36.9 C) (Temporal)    Resp 19    Ht 5\' 2"  (1.575 m)    Wt 55.3 kg    SpO2 97%    BMI 22.30 kg/m   Physical Exam Vitals and nursing note reviewed.  Constitutional:      Appearance: Normal appearance.  HENT:     Head: Normocephalic.     Comments: + posterior scalp hematoma     Nose: Nose normal.     Mouth/Throat:     Mouth: Mucous membranes are moist.  Eyes:     Extraocular Movements: Extraocular movements intact.     Pupils: Pupils are equal, round, and reactive to light.  Cardiovascular:     Rate and Rhythm: Normal rate and regular rhythm.     Pulses: Normal pulses.     Heart sounds: Normal heart sounds.  Pulmonary:     Effort: Pulmonary effort is normal.     Breath sounds: Normal breath sounds.  Abdominal:     General: Abdomen is flat.     Palpations: Abdomen is soft.  Musculoskeletal:     Cervical back: Normal range of motion and neck supple.     Comments: Deformity L wrist (chronic per patient), abrasion L elbow but no obvious deformity of the elbow. Mild diffuse tenderness L upper extremity. No midline spinal tenderness. No other obvious extremity trauma   Neurological:     General: No focal deficit  present.     Mental Status: She is alert and oriented to person, place, and time.  Psychiatric:        Mood and Affect: Mood normal.        Behavior: Behavior normal.    ED Results / Procedures / Treatments   Labs (all labs ordered are listed, but only abnormal results are displayed) Labs Reviewed  COMPREHENSIVE METABOLIC PANEL - Abnormal; Notable for the following components:      Result Value   Glucose, Bld 139 (*)    Creatinine, Ser 1.08 (*)    GFR, Estimated 52 (*)    All other components within normal limits  CBC - Abnormal; Notable for the following components:   WBC 12.0 (*)    Hemoglobin 15.6 (*)    HCT 47.1 (*)    All other components within normal limits  RESP PANEL BY RT-PCR (FLU A&B, COVID) ARPGX2  TROPONIN I (HIGH SENSITIVITY)    EKG EKG Interpretation  Date/Time:  Thursday July 24 2021 22:21:55 EST Ventricular Rate:  88 PR Interval:  188 QRS Duration: 88 QT Interval:  372 QTC Calculation: 451 R Axis:   78 Text Interpretation: Sinus rhythm Abnormal R-wave progression, early transition Borderline repolarization abnormality No significant change since last tracing Confirmed by Wandra Arthurs [phone]) on 07/24/2021 10:24:22 PM  Radiology DG Forearm Left  Result Date: 07/24/2021 CLINICAL DATA:  Recent fall with left forearm pain, initial encounter EXAM: LEFT FOREARM - 2 VIEW COMPARISON:  None. FINDINGS: There is no evidence of fracture or other focal bone lesions. Irregularity is noted along the distal ulna consistent with prior fracture and nonunion. Soft tissues are unremarkable. IMPRESSION: No acute abnormality noted. Electronically Signed   By: Inez Catalina M.D.   On: 07/24/2021 22:54   DG Wrist Complete Left  Result Date: 07/24/2021 CLINICAL DATA:  Recent fall with wrist pain, initial encounter EXAM: LEFT WRIST -  COMPLETE 3+ VIEW COMPARISON:  None. FINDINGS: Degenerative changes at the first [facility] joint are noted. Changes of prior ulnar fracture with  nonunion are seen. No acute fracture or dislocation is noted. No soft tissue changes are noted. IMPRESSION: Chronic changes without acute abnormality. Electronically Signed   By: Inez Catalina M.D.   On: 07/24/2021 22:55   CT HEAD WO CONTRAST  Result Date: 07/24/2021 CLINICAL DATA:  Trauma. EXAM: CT HEAD WITHOUT CONTRAST CT CERVICAL SPINE WITHOUT CONTRAST TECHNIQUE: Multidetector CT imaging of the head and cervical spine was performed following the standard protocol without intravenous contrast. Multiplanar CT image reconstructions of the cervical spine were also generated. COMPARISON:  CT head and cervical spine 08/08/2018. FINDINGS: CT HEAD FINDINGS Brain: No evidence of acute infarction, hemorrhage, hydrocephalus, extra-axial collection or mass lesion/mass effect. Again seen is mild diffuse atrophy. Small old infarct is again noted in the right basal ganglia extending to the corona radiata. There is stable mild periventricular white matter hypodensity, likely chronic small vessel ischemic change. There is also stable old small infarct in the left cerebellum. Vascular: No hyperdense vessel or unexpected calcification. Skull: Normal. Negative for fracture or focal lesion. Sinuses/Orbits: No acute finding. Other: There is posterior right parietal scalp soft tissue swelling. CT CERVICAL SPINE FINDINGS Alignment: Normal. Skull base and vertebrae: No acute fracture. No primary bone lesion or focal pathologic process. Chronic T3 compression fracture is unchanged. Soft tissues and spinal canal: No prevertebral fluid or swelling. No visible canal hematoma. Disc levels: There is mild disc space narrowing throughout the cervical spine similar to the prior study. No significant central canal or neural foraminal stenosis at any level. Upper chest: Negative. Other: There are calcifications in the thyroid gland. IMPRESSION: 1.  No acute intracranial process. 2. No acute fracture or traumatic subluxation of the cervical  spine. 3. Stable diffuse brain atrophy and chronic ischemic changes as above. Electronically Signed   By: Ronney Asters M.D.   On: 07/24/2021 23:01   CT CERVICAL SPINE WO CONTRAST  Result Date: 07/24/2021 CLINICAL DATA:  Trauma. EXAM: CT HEAD WITHOUT CONTRAST CT CERVICAL SPINE WITHOUT CONTRAST TECHNIQUE: Multidetector CT imaging of the head and cervical spine was performed following the standard protocol without intravenous contrast. Multiplanar CT image reconstructions of the cervical spine were also generated. COMPARISON:  CT head and cervical spine 08/08/2018. FINDINGS: CT HEAD FINDINGS Brain: No evidence of acute infarction, hemorrhage, hydrocephalus, extra-axial collection or mass lesion/mass effect. Again seen is mild diffuse atrophy. Small old infarct is again noted in the right basal ganglia extending to the corona radiata. There is stable mild periventricular white matter hypodensity, likely chronic small vessel ischemic change. There is also stable old small infarct in the left cerebellum. Vascular: No hyperdense vessel or unexpected calcification. Skull: Normal. Negative for fracture or focal lesion. Sinuses/Orbits: No acute finding. Other: There is posterior right parietal scalp soft tissue swelling. CT CERVICAL SPINE FINDINGS Alignment: Normal. Skull base and vertebrae: No acute fracture. No primary bone lesion or focal pathologic process. Chronic T3 compression fracture is unchanged. Soft tissues and spinal canal: No prevertebral fluid or swelling. No visible canal hematoma. Disc levels: There is mild disc space narrowing throughout the cervical spine similar to the prior study. No significant central canal or neural foraminal stenosis at any level. Upper chest: Negative. Other: There are calcifications in the thyroid gland. IMPRESSION: 1.  No acute intracranial process. 2. No acute fracture or traumatic subluxation of the cervical spine. 3. Stable diffuse brain atrophy and  chronic ischemic  changes as above. Electronically Signed   By: Ronney Asters M.D.   On: 07/24/2021 23:01   DG Pelvis Portable  Result Date: 07/24/2021 CLINICAL DATA:  Recent fall with pelvic pain, initial encounter EXAM: PORTABLE PELVIS 1 VIEWS COMPARISON:  None. FINDINGS: Pelvic ring is intact. Sacral ala are unremarkable. No acute fracture is seen. No soft tissue changes are noted. IMPRESSION: No acute abnormality noted. Electronically Signed   By: Inez Catalina M.D.   On: 07/24/2021 22:55   DG Chest Port 1 View  Result Date: 07/24/2021 CLINICAL DATA:  Recent fall with chest pain, initial encounter EXAM: PORTABLE CHEST 1 VIEW COMPARISON:  08/08/2018 FINDINGS: Cardiac shadow is within normal limits. Aortic calcifications are noted. Loop recorder is again seen. Lungs are clear. No acute bony abnormality is noted. IMPRESSION: No active disease. Electronically Signed   By: Inez Catalina M.D.   On: 07/24/2021 22:53   DG Humerus Left  Result Date: 07/24/2021 CLINICAL DATA:  Recent fall with left upper arm pain, initial encounter EXAM: LEFT HUMERUS - 2+ VIEW COMPARISON:  None. FINDINGS: There is no evidence of fracture or other focal bone lesions. Soft tissues are unremarkable. IMPRESSION: Acute abnormality noted. Electronically Signed   By: Inez Catalina M.D.   On: 07/24/2021 22:54    Procedures Procedures   Medications Ordered in ED Medications - No data to display  ED Course  I have reviewed the triage vital signs and the nursing notes.  Pertinent labs & imaging results that were available during my care of the patient were reviewed by me and considered in my medical decision making (see chart for details).    MDM Rules/Calculators/A&P                         ADDALEIGH NICHOLLS is a 80 y.o. female here with fall.  Patient fell backwards and she has a posterior scalp hematoma.  Level 2 trauma activated.  We will get CT head and cervical spine and left upper extremity x-rays.   11:31 PM CT head and cervical  spine unremarkable.  Labs unremarkable.  X-ray showed no fracture.  Stable for discharge home      Final Clinical Impression(s) / ED Diagnoses Final diagnoses:  Fall    Rx / DC Orders ED Discharge Orders     None        Drenda Freeze, MD 07/24/21 2332

## 2021-07-24 NOTE — Discharge Instructions (Signed)
Your CT scan did not show any fractures or intracranial bleeding.  You have a scalp hematoma.  Please apply ice on it  You can take Norco for severe pain and Tylenol for mild pain  See your doctor for follow up  Return to ER if you have severe headache, vomiting, weakness, numbness

## 2021-07-24 NOTE — ED Notes (Signed)
Trauma Response Nurse Note-  Reason for Call / Reason for Trauma activation:   - Level 2 trauma, fall on blood thinners  Initial Focused Assessment (If applicable, or please see trauma documentation):  - Pt alert and answering questions, complaining of left wrist pain. Hematoma noted to the posterior head.   Interventions:  - IV access obtained along with blood work. Portable x-rays completed. Pt taken to CT for head and c-spine scans.  Plan of Care as of this note:  - waiting on imaging and blood results  Event Summary:   - Pt came in as a level 2 trauma, fall on blood thinners. Pt came in with c-collar on. Hematoma noted to the posterior head. Pt alert on arrival. Clothing removed and pt placed on monitor. IV access and blood work obtained. Portable x-rays also obtained. Husband came to bedside and pt taken to CT. Pt noted to have a small amount of bleeding on the left elbow, so gauze bandage placed.

## 2021-07-24 NOTE — ED Triage Notes (Signed)
Pt walking using walker, ground-level fall, -LOC, + hit head, no obvious deformities or trauma. Pt remembers entire incident, endorses neck & back pain, ccollar in place. Hx demenia

## 2021-07-24 NOTE — Progress Notes (Signed)
Orthopedic Tech Progress Note Patient Details:  Stacey Ramirez 03-13-1941 [id number] Level 2 trauma Patient ID: Stacey Ramirez, female   DOB: Oct 02, 1940, 80 y.o.   MRN: [id number]  [name] 07/24/2021, 10:36 PM

## 2021-07-24 NOTE — ED Notes (Signed)
Patient transported to CT w/ TRN ?

## 2021-07-25 DIAGNOSIS — W19XXXA Unspecified fall, initial encounter: Secondary | ICD-10-CM | POA: Diagnosis not present

## 2021-07-25 DIAGNOSIS — M2550 Pain in unspecified joint: Secondary | ICD-10-CM | POA: Diagnosis not present

## 2021-07-25 DIAGNOSIS — Z7401 Bed confinement status: Secondary | ICD-10-CM | POA: Diagnosis not present

## 2021-07-25 NOTE — ED Notes (Signed)
[facility] CALLED LONG WAIT

## 2021-07-25 NOTE — ED Notes (Signed)
[facility] transport to take home at 10:30am

## 2021-07-29 ENCOUNTER — Other Ambulatory Visit: Payer: Self-pay

## 2021-07-29 ENCOUNTER — Encounter (HOSPITAL_BASED_OUTPATIENT_CLINIC_OR_DEPARTMENT_OTHER): Payer: Self-pay

## 2021-07-29 ENCOUNTER — Emergency Department (HOSPITAL_BASED_OUTPATIENT_CLINIC_OR_DEPARTMENT_OTHER)
Admission: EM | Admit: 2021-07-29 | Discharge: 2021-07-29 | Disposition: A | Discharge status: Home / Self Care | Payer: PPO | Attending: Emergency Medicine | Admitting: Emergency Medicine

## 2021-07-29 ENCOUNTER — Emergency Department (HOSPITAL_BASED_OUTPATIENT_CLINIC_OR_DEPARTMENT_OTHER): Payer: PPO

## 2021-07-29 DIAGNOSIS — U071 COVID-19: Secondary | ICD-10-CM | POA: Diagnosis not present

## 2021-07-29 DIAGNOSIS — Z79899 Other long term (current) drug therapy: Secondary | ICD-10-CM | POA: Insufficient documentation

## 2021-07-29 DIAGNOSIS — E039 Hypothyroidism, unspecified: Secondary | ICD-10-CM | POA: Insufficient documentation

## 2021-07-29 DIAGNOSIS — R531 Weakness: Secondary | ICD-10-CM | POA: Diagnosis not present

## 2021-07-29 DIAGNOSIS — M2550 Pain in unspecified joint: Secondary | ICD-10-CM | POA: Diagnosis not present

## 2021-07-29 DIAGNOSIS — Z7902 Long term (current) use of antithrombotics/antiplatelets: Secondary | ICD-10-CM | POA: Insufficient documentation

## 2021-07-29 DIAGNOSIS — I2510 Atherosclerotic heart disease of native coronary artery without angina pectoris: Secondary | ICD-10-CM | POA: Diagnosis not present

## 2021-07-29 DIAGNOSIS — I10 Essential (primary) hypertension: Secondary | ICD-10-CM | POA: Insufficient documentation

## 2021-07-29 DIAGNOSIS — Z7401 Bed confinement status: Secondary | ICD-10-CM | POA: Diagnosis not present

## 2021-07-29 DIAGNOSIS — T68XXXA Hypothermia, initial encounter: Secondary | ICD-10-CM | POA: Diagnosis not present

## 2021-07-29 DIAGNOSIS — R059 Cough, unspecified: Secondary | ICD-10-CM

## 2021-07-29 DIAGNOSIS — R509 Fever, unspecified: Secondary | ICD-10-CM | POA: Diagnosis not present

## 2021-07-29 LAB — RESP PANEL BY RT-PCR (FLU A&B, COVID) ARPGX2
Influenza A by PCR: NEGATIVE
Influenza B by PCR: NEGATIVE
SARS Coronavirus 2 by RT PCR: POSITIVE — AB

## 2021-07-29 MED ORDER — DILTIAZEM HCL ER COATED BEADS 180 MG PO CP24: 180.0000 mg | ORAL_CAPSULE | Freq: Once | ORAL | Status: DC

## 2021-07-29 MED ORDER — HYDRALAZINE HCL 10 MG PO TABS
10.0000 mg | ORAL_TABLET | Freq: Once | ORAL | Status: AC
  Administered 2021-07-29: 13:00:00 10 mg via ORAL
  Filled 2021-07-29: qty 1

## 2021-07-29 MED ORDER — DILTIAZEM HCL ER COATED BEADS 180 MG PO CP24
180.0000 mg | ORAL_CAPSULE | Freq: Once | ORAL | Status: DC
  Filled 2021-07-29: qty 1

## 2021-07-29 MED ORDER — MOLNUPIRAVIR EUA 200MG CAPSULE
4.0000 | ORAL_CAPSULE | Freq: Two times a day (BID) | ORAL | 0 refills | Status: AC
Start: 2021-07-29 — End: 2021-08-03

## 2021-07-29 NOTE — ED Provider Notes (Signed)
[place] EMERGENCY DEPARTMENT Provider Note   CSN: [id number] Arrival date & time: 07/29/21  1015     History Chief Complaint  Patient presents with   Cough    Stacey Ramirez is a 80 y.o. female.  Presenting to ER via EMS for cough.  Patient reports that over the past night she developed a cough, nonproductive, not associated with any chest pain or difficulty breathing.  Had low-grade temperature.  Some chills.  No other symptoms of note.  Notably recent ER evaluation for fall.  Trauma work-up negative at that time.  HPI     Past Medical History:  Diagnosis Date   Adjustment reaction with anxiety 12/20/2015   Aortic stenosis    CAD (coronary artery disease) 06/09/2017   Nuc study 10/18: EF 69, inferolateral perfusion defect-possible small infarct with peri-infarct ischemia; intermediate risk // LHC 10/18: pLAD 25, pLCx 86, pRCA 25 >> med Rx   Carotid stenosis, asymptomatic, bilateral 02/27/2016   Carotid US 5/17: Bilateral ICA 1-39   Cerebrovascular accident (stroke) ([place])    a. 12/2015 - cryptogenic.  S/P MDT Linq.   Difficulty walking    Essential hypertension    Gait disorder 04/07/2016   Heart palpitations 09/06/2012   History of CVA with residual deficit 12/14/2015   History of dizziness    History of echocardiogram    Echo 10/18: Vigorous LVF, EF 65-70, normal wall motion, grade 1 diastolic dysfunction, GLS -21.1%, mild RAE   History of nuclear stress test    Nuclear stress test 10/18: EF 69, inf-lat defect c/w poss infarct with peri-infarct ischemia; Intermediate Risk   Hyperlipidemia    Hypothyroidism    Left hemiparesis (HCC)    LVH (left ventricular hypertrophy)    Mitral regurgitation    a. 12/2015 Echo: EF 60-65%, mild focal basal hypertrophy. No rwma, triv AI, mild MR, mildly dil LA w/o thrombus. No RA thrombus. No PFO.   Palpitations    Prediabetes    Stroke-like symptom 12/13/2015   SVT (supraventricular tachycardia) ([place]) 12/14/2015   Vascular  parkinsonism ([place]) 06/29/2016   Vitamin D deficiency     Patient Active Problem List   Diagnosis Date Noted   Familial hypercholesteremia 02/21/2018   Psychogenic gait 10/21/2017   Coronary artery disease involving native coronary artery of native heart without angina pectoris 06/09/2017   Abnormal nuclear cardiac imaging test 05/25/2017   PVC's (premature ventricular contractions) 05/17/2017   Anxiety 12/29/2016   Vascular parkinsonism ([place]) 06/29/2016   Gait disorder 04/07/2016   Intracranial carotid stenosis, bilateral 02/27/2016   Essential hypertension    Left-sided weakness 12/28/2015   Headache 12/28/2015   Adjustment reaction with anxiety 12/20/2015   Left hemiparesis ([place])    History of CVA with residual deficit    Prediabetes    SVT (supraventricular tachycardia) ([place]) 12/14/2015   Stroke-like symptom 12/13/2015   History of stroke    Heart palpitations 09/06/2012   Hypertension    Familial hyperlipidemia    Hypothyroidism    Vitamin D deficiency    Palpitations    History of dizziness    LVH (left ventricular hypertrophy)    Aortic stenosis    Mitral regurgitation    SOB (shortness of breath)    Difficulty walking     Past Surgical History:  Procedure Laterality Date   CARDIOVASCULAR STRESS TEST  2002   NORMAL   EP IMPLANTABLE DEVICE N/A 12/17/2015   Procedure: Loop Recorder Insertion;  Surgeon: [name], MD;  Location:  [facility] INVASIVE CV LAB;  Service: Cardiovascular;  Laterality: N/A;   LEFT HEART CATH AND CORONARY ANGIOGRAPHY N/A 05/25/2017   Procedure: LEFT HEART CATH AND CORONARY ANGIOGRAPHY;  Surgeon: Sherren Mocha, MD;  Location: [facility] CV LAB;  Service: Cardiovascular;  Laterality: N/A;   LOOP RECORDER IMPLANT     TEE WITHOUT CARDIOVERSION N/A 12/17/2015   Procedure: TRANSESOPHAGEAL ECHOCARDIOGRAM (TEE);  Surgeon: Lelon Perla, MD;  Location: [facility] ENDOSCOPY;  Service: Cardiovascular;  Laterality: N/A;  Pt also needs a LOOP   TRANSTHORACIC  ECHOCARDIOGRAM  2008   SHOWED LEFT VENTRICULAR HYPERTROPHY AND MILD AORTIC STENOSIS     OB History   No obstetric history on file.     Family History  Problem Relation Age of Onset   Heart attack Mother    Alzheimer's disease Mother    Stroke Paternal Uncle     Social History   Tobacco Use   Smoking status: Never   Smokeless tobacco: Never  Vaping Use   Vaping Use: Never used  Substance Use Topics   Alcohol use: Yes    Alcohol/week: 1.0 standard drink    Types: 1 Glasses of wine per week    Comment: socially   Drug use: No    Home Medications Prior to Admission medications   Medication Sig Start Date End Date Taking? Authorizing Provider  molnupiravir EUA (LAGEVRIO) 200 mg CAPS capsule Take 4 capsules (800 mg total) by mouth 2 (two) times daily for 5 days. 07/29/21 08/03/21 Yes Jaran Sainz, Ellwood Dense, MD  ALPRAZolam Duanne Moron) 0.25 MG tablet Take 0.25 mg by mouth at bedtime as needed for anxiety or sleep.    [provider]  Calcium Citrate (CITRACAL PO) Take by mouth daily.    [provider]  carboxymethylcellulose (REFRESH PLUS) 0.5 % SOLN Place 1 drop into both eyes 3 (three) times daily as needed (dry eyes).     [provider]  Cholecalciferol (VITAMIN D) 2000 units tablet Take 2,000 Units by mouth daily.    [provider]  clopidogrel (PLAVIX) 75 MG tablet Take 1 tablet (75 mg total) by mouth daily. 04/18/21 04/18/22  Richardson Dopp T, PA-C  diltiazem (CARDIZEM CD) 180 MG 24 hr capsule TAKE 1 CAPSULE(180 MG) BY MOUTH DAILY. PLEASE MAKE OVERDUE APPOINTMENT WITH DOCTOR NAHSER BEFORE ANYMORE REFILLS. THANK YOU 2 ND ATTEMPT 04/22/21   Nahser, Wonda Cheng, MD  docusate sodium (COLACE) 100 MG capsule Take 200 mg by mouth daily.    [provider]  HYDROcodone-acetaminophen (NORCO/VICODIN) 5-325 MG tablet Take 1 tablet by mouth every 6 (six) hours as needed. 07/24/21   Drenda Freeze, MD  irbesartan (AVAPRO) 300 MG tablet Take 1 tablet (300  mg total) by mouth daily. 04/18/21 04/18/22  Richardson Dopp T, PA-C  levothyroxine (SYNTHROID, LEVOTHROID) 25 MCG tablet Take 25 mcg by mouth daily before breakfast.  09/21/14   [provider]  Multiple Vitamin (MULTIVITAMIN) tablet Take 1 tablet by mouth daily.    [provider]  nebivolol (BYSTOLIC) 2.5 MG tablet TAKE 1 TABLET(2.5 MG) BY MOUTH DAILY 04/16/21   Richardson Dopp T, PA-C  sertraline (ZOLOFT) 100 MG tablet Take 1 tablet by mouth daily. 08/23/18   [provider]  trospium (SANCTURA) 20 MG tablet Take 20 mg by mouth 2 (two) times daily. 10/24/19   [provider]    Allergies    Sulfa drugs cross reactors, Zocor [simvastatin], Crestor [rosuvastatin calcium], Statins, Sulfa antibiotics, and Micardis [telmisartan]  Review of Systems  Review of Systems  Constitutional:  Positive for chills and fatigue. Negative for fever.  HENT:  Negative for ear pain and sore throat.   Eyes:  Negative for pain and visual disturbance.  Respiratory:  Positive for cough. Negative for shortness of breath.   Cardiovascular:  Negative for chest pain and palpitations.  Gastrointestinal:  Negative for abdominal pain and vomiting.  Genitourinary:  Negative for dysuria and hematuria.  Musculoskeletal:  Negative for arthralgias and back pain.  Skin:  Negative for color change and rash.  Neurological:  Negative for seizures and syncope.  All other systems reviewed and are negative.  Physical Exam Updated Vital Signs BP (!) 136/96    Pulse 90    Temp 99.1 F (37.3 C)    Resp 18    Ht 5\' 2"  (1.575 m)    Wt 55.3 kg    SpO2 91%    BMI 22.30 kg/m   Physical Exam Vitals and nursing note reviewed.  Constitutional:      General: She is not in acute distress.    Appearance: She is well-developed.  HENT:     Head: Normocephalic and atraumatic.  Eyes:     Conjunctiva/sclera: Conjunctivae normal.  Cardiovascular:     Rate and Rhythm: Normal rate and regular rhythm.     Heart  sounds: No murmur heard. Pulmonary:     Effort: Pulmonary effort is normal. No respiratory distress.     Breath sounds: Normal breath sounds.  Abdominal:     Palpations: Abdomen is soft.     Tenderness: There is no abdominal tenderness.  Musculoskeletal:        General: No swelling.     Cervical back: Neck supple.  Skin:    General: Skin is warm and dry.     Capillary Refill: Capillary refill takes less than 2 seconds.  Neurological:     General: No focal deficit present.     Mental Status: She is alert.  Psychiatric:        Mood and Affect: Mood normal.    ED Results / Procedures / Treatments   Labs (all labs ordered are listed, but only abnormal results are displayed) Labs Reviewed  RESP PANEL BY RT-PCR (FLU A&B, COVID) ARPGX2 - Abnormal; Notable for the following components:      Result Value   SARS Coronavirus 2 by RT PCR POSITIVE (*)    All other components within normal limits    EKG EKG Interpretation  Date/Time:  Tuesday July 29 2021 10:43:59 EST Ventricular Rate:  87 PR Interval:  167 QRS Duration: 96 QT Interval:  398 QTC Calculation: 479 R Axis:   62 Text Interpretation: Sinus rhythm Consider RVH or posterior infarct Confirmed by Madalyn Rob [phone]) on 07/29/2021 10:46:41 AM  Radiology DG Chest Port 1 View  Result Date: 07/29/2021 CLINICAL DATA:  Weakness for 1 week.  Fall.  Cough. EXAM: PORTABLE CHEST 1 VIEW COMPARISON:  07/24/2021 FINDINGS: The heart size and mediastinal contours are within normal limits. Aortic atherosclerotic calcifications noted. Implantable loop recorder is identified within the projection of the left hemiabdomen. Both lungs are clear. The visualized skeletal structures are unremarkable. IMPRESSION: No acute cardiopulmonary abnormalities. Electronically Signed   By: Kerby Moors M.D.   On: 07/29/2021 11:10    Procedures Procedures   Medications Ordered in ED Medications - No data to display  ED Course  I have reviewed  the triage vital signs and the nursing notes.  Pertinent labs & imaging results that  were available during my care of the patient were reviewed by me and considered in my medical decision making (see chart for details).    MDM Rules/Calculators/A&P                         80 year old lady with concern of cough, chills, low-grade fever.  On exam she appears well in no distresswith stable vital signs.  Chest x-ray is negative for pneumonia.  COVID test is positive.  Suspect culprit for her symptoms today.  Patient has multiple medical comorbidities and has medication interactions with Paxlovid. Will therefore recommend course of Molnupivir.  She has no hypoxia or tachypnea or increased work of breathing.  Tolerating p.o. without difficulty.  Believe she is appropriate for outpatient management and follow-up with her primary care doctor.  Reviewed return precautions and discharged.  After the discussed management above, the patient was determined to be safe for discharge.  The patient was in agreement with this plan and all questions regarding their care were answered.  ED return precautions were discussed and the patient will return to the ED with any significant worsening of condition.  Stacey Ramirez was evaluated in Emergency Department on 07/29/2021 for the symptoms described in the history of present illness. She was evaluated in the context of the global COVID-19 pandemic, which necessitated consideration that the patient might be at risk for infection with the SARS-CoV-2 virus that causes COVID-19. Institutional protocols and algorithms that pertain to the evaluation of patients at risk for COVID-19 are in a state of rapid change based on information released by regulatory bodies including the CDC and federal and state organizations. These policies and algorithms were followed during the patient's care in the ED.  Final Clinical Impression(s) / ED Diagnoses Final diagnoses:  COVID-19    Rx / DC  Orders ED Discharge Orders          Ordered    molnupiravir EUA (LAGEVRIO) 200 mg CAPS capsule  2 times daily        07/29/21 1156             Lucrezia Starch, MD 07/29/21 1208

## 2021-07-29 NOTE — Discharge Instructions (Addendum)
Follow-up with your primary care doctor.  Take the new medication as prescribed.  Come back to ER if you develop difficulty breathing, chest pain, vomiting or other new concerning symptom.

## 2021-07-29 NOTE — ED Triage Notes (Addendum)
Pt brought in by EMS. EMS report pt has been week x 1 week and had fall. Pt already seen for this fall. Cough all night per husband. Temp 100.Marland Kitchen also continues to have pain in left wrist from fall

## 2021-07-29 NOTE — ED Notes (Signed)
EDP notified of elevated BP. Per husband pt did not take medication this am. Routine meds not available in pyxsis. Hydralazine ordered

## 2021-07-29 NOTE — ED Notes (Signed)
PTAR in to transport 

## 2021-08-01 ENCOUNTER — Other Ambulatory Visit: Payer: Self-pay | Admitting: Physician Assistant

## 2021-08-13 DIAGNOSIS — M542 Cervicalgia: Secondary | ICD-10-CM | POA: Diagnosis not present

## 2021-08-13 DIAGNOSIS — M25532 Pain in left wrist: Secondary | ICD-10-CM | POA: Diagnosis not present

## 2021-08-13 DIAGNOSIS — M546 Pain in thoracic spine: Secondary | ICD-10-CM | POA: Diagnosis not present

## 2022-01-21 DIAGNOSIS — E039 Hypothyroidism, unspecified: Secondary | ICD-10-CM | POA: Diagnosis not present

## 2022-01-21 DIAGNOSIS — R413 Other amnesia: Secondary | ICD-10-CM | POA: Diagnosis not present

## 2022-01-21 DIAGNOSIS — Z79899 Other long term (current) drug therapy: Secondary | ICD-10-CM | POA: Diagnosis not present

## 2022-01-21 DIAGNOSIS — Z111 Encounter for screening for respiratory tuberculosis: Secondary | ICD-10-CM | POA: Diagnosis not present

## 2022-01-21 DIAGNOSIS — I10 Essential (primary) hypertension: Secondary | ICD-10-CM | POA: Diagnosis not present

## 2022-01-21 DIAGNOSIS — E785 Hyperlipidemia, unspecified: Secondary | ICD-10-CM | POA: Diagnosis not present

## 2022-01-21 DIAGNOSIS — Z8673 Personal history of transient ischemic attack (TIA), and cerebral infarction without residual deficits: Secondary | ICD-10-CM | POA: Diagnosis not present

## 2022-02-11 DIAGNOSIS — I10 Essential (primary) hypertension: Secondary | ICD-10-CM | POA: Diagnosis not present

## 2022-02-11 DIAGNOSIS — G20 Parkinson's disease: Secondary | ICD-10-CM | POA: Diagnosis not present

## 2022-02-11 DIAGNOSIS — F339 Major depressive disorder, recurrent, unspecified: Secondary | ICD-10-CM | POA: Diagnosis not present

## 2022-02-11 DIAGNOSIS — E559 Vitamin D deficiency, unspecified: Secondary | ICD-10-CM | POA: Diagnosis not present

## 2022-02-19 DIAGNOSIS — E039 Hypothyroidism, unspecified: Secondary | ICD-10-CM | POA: Diagnosis not present

## 2022-02-19 DIAGNOSIS — G20 Parkinson's disease: Secondary | ICD-10-CM | POA: Diagnosis not present

## 2022-02-19 DIAGNOSIS — R296 Repeated falls: Secondary | ICD-10-CM | POA: Diagnosis not present

## 2022-02-19 DIAGNOSIS — E559 Vitamin D deficiency, unspecified: Secondary | ICD-10-CM | POA: Diagnosis not present

## 2022-02-19 DIAGNOSIS — M1389 Other specified arthritis, multiple sites: Secondary | ICD-10-CM | POA: Diagnosis not present

## 2022-02-19 DIAGNOSIS — Z8673 Personal history of transient ischemic attack (TIA), and cerebral infarction without residual deficits: Secondary | ICD-10-CM | POA: Diagnosis not present

## 2022-02-19 DIAGNOSIS — R269 Unspecified abnormalities of gait and mobility: Secondary | ICD-10-CM | POA: Diagnosis not present

## 2022-02-19 DIAGNOSIS — F330 Major depressive disorder, recurrent, mild: Secondary | ICD-10-CM | POA: Diagnosis not present

## 2022-02-19 DIAGNOSIS — I10 Essential (primary) hypertension: Secondary | ICD-10-CM | POA: Diagnosis not present

## 2022-02-19 DIAGNOSIS — R419 Unspecified symptoms and signs involving cognitive functions and awareness: Secondary | ICD-10-CM | POA: Diagnosis not present

## 2022-03-03 DIAGNOSIS — E559 Vitamin D deficiency, unspecified: Secondary | ICD-10-CM | POA: Diagnosis not present

## 2022-03-03 DIAGNOSIS — I10 Essential (primary) hypertension: Secondary | ICD-10-CM | POA: Diagnosis not present

## 2022-03-03 DIAGNOSIS — F330 Major depressive disorder, recurrent, mild: Secondary | ICD-10-CM | POA: Diagnosis not present

## 2022-03-03 DIAGNOSIS — G20 Parkinson's disease: Secondary | ICD-10-CM | POA: Diagnosis not present

## 2022-03-03 DIAGNOSIS — E039 Hypothyroidism, unspecified: Secondary | ICD-10-CM | POA: Diagnosis not present

## 2022-03-03 DIAGNOSIS — R296 Repeated falls: Secondary | ICD-10-CM | POA: Diagnosis not present

## 2022-03-03 DIAGNOSIS — Z8673 Personal history of transient ischemic attack (TIA), and cerebral infarction without residual deficits: Secondary | ICD-10-CM | POA: Diagnosis not present

## 2022-03-03 DIAGNOSIS — R419 Unspecified symptoms and signs involving cognitive functions and awareness: Secondary | ICD-10-CM | POA: Diagnosis not present

## 2022-03-03 DIAGNOSIS — M1389 Other specified arthritis, multiple sites: Secondary | ICD-10-CM | POA: Diagnosis not present

## 2022-03-03 DIAGNOSIS — R269 Unspecified abnormalities of gait and mobility: Secondary | ICD-10-CM | POA: Diagnosis not present

## 2022-03-16 DIAGNOSIS — M1389 Other specified arthritis, multiple sites: Secondary | ICD-10-CM | POA: Diagnosis not present

## 2022-03-16 DIAGNOSIS — R269 Unspecified abnormalities of gait and mobility: Secondary | ICD-10-CM | POA: Diagnosis not present

## 2022-03-16 DIAGNOSIS — G20 Parkinson's disease: Secondary | ICD-10-CM | POA: Diagnosis not present

## 2022-03-18 DIAGNOSIS — E559 Vitamin D deficiency, unspecified: Secondary | ICD-10-CM | POA: Diagnosis not present

## 2022-03-18 DIAGNOSIS — I10 Essential (primary) hypertension: Secondary | ICD-10-CM | POA: Diagnosis not present

## 2022-03-18 DIAGNOSIS — G20 Parkinson's disease: Secondary | ICD-10-CM | POA: Diagnosis not present

## 2022-03-18 DIAGNOSIS — F339 Major depressive disorder, recurrent, unspecified: Secondary | ICD-10-CM | POA: Diagnosis not present

## 2022-03-25 DIAGNOSIS — G20 Parkinson's disease: Secondary | ICD-10-CM | POA: Diagnosis not present

## 2022-03-25 DIAGNOSIS — R600 Localized edema: Secondary | ICD-10-CM | POA: Diagnosis not present

## 2022-03-25 DIAGNOSIS — I10 Essential (primary) hypertension: Secondary | ICD-10-CM | POA: Diagnosis not present

## 2022-04-10 ENCOUNTER — Other Ambulatory Visit: Payer: Self-pay | Admitting: Physician Assistant

## 2022-04-20 DIAGNOSIS — F0390 Unspecified dementia without behavioral disturbance: Secondary | ICD-10-CM | POA: Diagnosis not present

## 2022-04-20 DIAGNOSIS — I2510 Atherosclerotic heart disease of native coronary artery without angina pectoris: Secondary | ICD-10-CM | POA: Diagnosis not present

## 2022-04-20 DIAGNOSIS — N202 Calculus of kidney with calculus of ureter: Secondary | ICD-10-CM | POA: Diagnosis not present

## 2022-04-20 DIAGNOSIS — W1789XA Other fall from one level to another, initial encounter: Secondary | ICD-10-CM | POA: Diagnosis not present

## 2022-04-20 DIAGNOSIS — R58 Hemorrhage, not elsewhere classified: Secondary | ICD-10-CM | POA: Diagnosis not present

## 2022-04-20 DIAGNOSIS — Y998 Other external cause status: Secondary | ICD-10-CM | POA: Diagnosis not present

## 2022-04-20 DIAGNOSIS — I700 Atherosclerosis of aorta: Secondary | ICD-10-CM | POA: Diagnosis not present

## 2022-04-20 DIAGNOSIS — R195 Other fecal abnormalities: Secondary | ICD-10-CM | POA: Diagnosis not present

## 2022-04-20 DIAGNOSIS — Z8673 Personal history of transient ischemic attack (TIA), and cerebral infarction without residual deficits: Secondary | ICD-10-CM | POA: Diagnosis not present

## 2022-04-20 DIAGNOSIS — N200 Calculus of kidney: Secondary | ICD-10-CM | POA: Diagnosis not present

## 2022-04-20 DIAGNOSIS — I6381 Other cerebral infarction due to occlusion or stenosis of small artery: Secondary | ICD-10-CM | POA: Diagnosis not present

## 2022-04-20 DIAGNOSIS — R319 Hematuria, unspecified: Secondary | ICD-10-CM | POA: Diagnosis not present

## 2022-04-20 DIAGNOSIS — K5730 Diverticulosis of large intestine without perforation or abscess without bleeding: Secondary | ICD-10-CM | POA: Diagnosis not present

## 2022-04-21 DIAGNOSIS — R58 Hemorrhage, not elsewhere classified: Secondary | ICD-10-CM | POA: Diagnosis not present

## 2022-04-21 DIAGNOSIS — W19XXXD Unspecified fall, subsequent encounter: Secondary | ICD-10-CM | POA: Diagnosis not present

## 2022-04-21 DIAGNOSIS — R031 Nonspecific low blood-pressure reading: Secondary | ICD-10-CM | POA: Diagnosis not present

## 2022-04-21 DIAGNOSIS — Z09 Encounter for follow-up examination after completed treatment for conditions other than malignant neoplasm: Secondary | ICD-10-CM | POA: Diagnosis not present

## 2022-07-10 ENCOUNTER — Other Ambulatory Visit: Payer: Self-pay | Admitting: Cardiovascular Disease

## 2022-07-10 ENCOUNTER — Other Ambulatory Visit: Payer: Self-pay

## 2022-07-10 ENCOUNTER — Other Ambulatory Visit: Payer: Self-pay | Admitting: Physician Assistant

## 2022-07-10 MED ORDER — NEBIVOLOL HCL 2.5 MG PO TABS: ORAL_TABLET | ORAL | 0 refills | Status: DC

## 2022-07-21 ENCOUNTER — Encounter: Payer: Self-pay | Admitting: Cardiovascular Disease

## 2022-08-28 ENCOUNTER — Ambulatory Visit: Payer: PPO | Admitting: Nurse Practitioner

## 2022-08-28 ENCOUNTER — Ambulatory Visit: Payer: PPO | Attending: Nurse Practitioner | Admitting: Nurse Practitioner

## 2022-08-28 ENCOUNTER — Encounter: Payer: Self-pay | Admitting: Nurse Practitioner

## 2022-08-28 VITALS — BP 124/80 | HR 67 | Ht 62.0 in | Wt 112.0 lb

## 2022-08-28 DIAGNOSIS — I6930 Unspecified sequelae of cerebral infarction: Secondary | ICD-10-CM | POA: Diagnosis not present

## 2022-08-28 DIAGNOSIS — I2510 Atherosclerotic heart disease of native coronary artery without angina pectoris: Secondary | ICD-10-CM | POA: Diagnosis not present

## 2022-08-28 DIAGNOSIS — E785 Hyperlipidemia, unspecified: Secondary | ICD-10-CM | POA: Diagnosis not present

## 2022-08-28 DIAGNOSIS — I10 Essential (primary) hypertension: Secondary | ICD-10-CM

## 2022-08-28 MED ORDER — CLOPIDOGREL BISULFATE 75 MG PO TABS: 75.0000 mg | ORAL_TABLET | Freq: Every day | ORAL | 3 refills | Status: DC

## 2022-08-28 MED ORDER — IRBESARTAN 300 MG PO TABS: 300.0000 mg | ORAL_TABLET | Freq: Every day | ORAL | 3 refills | Status: DC

## 2022-08-28 MED ORDER — NEBIVOLOL HCL 2.5 MG PO TABS: 2.5000 mg | ORAL_TABLET | Freq: Every day | ORAL | 3 refills | Status: DC

## 2022-08-28 MED ORDER — DILTIAZEM HCL ER COATED BEADS 180 MG PO CP24: 180.0000 mg | ORAL_CAPSULE | Freq: Every day | ORAL | 3 refills | Status: DC

## 2022-08-28 NOTE — Patient Instructions (Signed)
Medication Instructions:  Your physician recommends that you continue on your current medications as directed. Please refer to the Current Medication list given to you today. *If you need a refill on your cardiac medications before your next appointment, please call your pharmacy*   Lab Work: LIPIDS &LFT If you have labs (blood work) drawn today and your tests are completely normal, you will receive your results only by: [place] (if you have MyChart) OR A paper copy in the mail If you have any lab test that is abnormal or we need to change your treatment, we will call you to review the results.   Testing/Procedures: NONE ORDERED   Follow-Up: At [facility], you and your health needs are our priority.  As part of our continuing mission to provide you with exceptional heart care, we have created designated Provider Care Teams.  These Care Teams include your primary Cardiologist (physician) and Advanced Practice Providers (APPs -  Physician Assistants and Nurse Practitioners) who all work together to provide you with the care you need, when you need it.  We recommend signing up for the patient portal called "MyChart".  Sign up information is provided on this After Visit Summary.  MyChart is used to connect with patients for Virtual Visits (Telemedicine).  Patients are able to view lab/test results, encounter notes, upcoming appointments, etc.  Non-urgent messages can be sent to your provider as well.   To learn more about what you can do with MyChart, go to [email or web address].    Your next appointment:   12 month(s)  Provider:   Mertie Moores, MD     Other Instructions

## 2022-08-28 NOTE — Progress Notes (Signed)
Office Visit    Patient Name: Stacey Ramirez Date of Encounter: 08/28/2022  Primary Care Provider:  Deland Pretty, MD Primary Cardiologist:  Mertie Moores, MD Primary Electrophysiologist: None  Chief Complaint   Stacey Ramirez is a 82 y.o. female with PMH of CAD s/p LHC 2018 showing nonobstructive CAD, HTN, HLD, CVA s/p ILR, PVCs s/p event monitor with 5% burden and nonsustained SVT, carotid artery disease with (1-39% bilateral ICA), hypothyroidism who presents today for follow-up of CAD and hypertension.   Past Medical History    Past Medical History:  Diagnosis Date   Adjustment reaction with anxiety 12/20/2015   Aortic stenosis    CAD (coronary artery disease) 06/09/2017   Nuc study 10/18: EF 69, inferolateral perfusion defect-possible small infarct with peri-infarct ischemia; intermediate risk // LHC 10/18: pLAD 25, pLCx 33, pRCA 25 >> med Rx   Carotid stenosis, asymptomatic, bilateral 02/27/2016   Carotid US 5/17: Bilateral ICA 1-39   Cerebrovascular accident (stroke) ([place])    a. 12/2015 - cryptogenic.  S/P MDT Linq.   Difficulty walking    Essential hypertension    Gait disorder 04/07/2016   Heart palpitations 09/06/2012   History of CVA with residual deficit 12/14/2015   History of dizziness    History of echocardiogram    Echo 10/18: Vigorous LVF, EF 65-70, normal wall motion, grade 1 diastolic dysfunction, GLS -21.1%, mild RAE   History of nuclear stress test    Nuclear stress test 10/18: EF 69, inf-lat defect c/w poss infarct with peri-infarct ischemia; Intermediate Risk   Hyperlipidemia    Hypothyroidism    Left hemiparesis (HCC)    LVH (left ventricular hypertrophy)    Mitral regurgitation    a. 12/2015 Echo: EF 60-65%, mild focal basal hypertrophy. No rwma, triv AI, mild MR, mildly dil LA w/o thrombus. No RA thrombus. No PFO.   Palpitations    Prediabetes    Stroke-like symptom 12/13/2015   SVT (supraventricular tachycardia) 12/14/2015   Vascular parkinsonism ([place])  06/29/2016   Vitamin D deficiency    Past Surgical History:  Procedure Laterality Date   CARDIOVASCULAR STRESS TEST  2002   NORMAL   EP IMPLANTABLE DEVICE N/A 12/17/2015   Procedure: Loop Recorder Insertion;  Surgeon: [name], MD;  Location: [place] CV LAB;  Service: Cardiovascular;  Laterality: N/A;   LEFT HEART CATH AND CORONARY ANGIOGRAPHY N/A 05/25/2017   Procedure: LEFT HEART CATH AND CORONARY ANGIOGRAPHY;  Surgeon: Sherren Mocha, MD;  Location: [place] CV LAB;  Service: Cardiovascular;  Laterality: N/A;   LOOP RECORDER IMPLANT     TEE WITHOUT CARDIOVERSION N/A 12/17/2015   Procedure: TRANSESOPHAGEAL ECHOCARDIOGRAM (TEE);  Surgeon: Lelon Perla, MD;  Location: [facility] ENDOSCOPY;  Service: Cardiovascular;  Laterality: N/A;  Pt also needs a LOOP   TRANSTHORACIC ECHOCARDIOGRAM  2008   SHOWED LEFT VENTRICULAR HYPERTROPHY AND MILD AORTIC STENOSIS    Allergies  Allergies  Allergen Reactions   Sulfa Drugs Cross Reactors Itching   Zocor [Simvastatin] Other (See Comments)    Feels like she is going to pass out, weakness   Crestor [Rosuvastatin Calcium]     Muscle weakness   Statins Other (See Comments)    Unable to walk or stand. Unable to walk or stand. Unable to walk or stand.   Sulfa Antibiotics     welts   Micardis [Telmisartan] Other (See Comments)    Feels like she is going to pass out    History of Present Illness  Stacey Ramirez  is a 82 year old female with the above mention past medical history who presents today for follow-up of CAD and hypertension.  She had a [place] Myoview completed 05/2017 for shortness of breath and palpitations that revealed EF of 69% with intermediate risk.  She underwent LHC performed a restratification on 05/25/2017 that revealed mild nonobstructive CAD in LAD and RCA.  She had a 2D echo completed 05/05/2017 that showed normal heart function with no valvular abnormalities.     [name] was last seen in 04/2021 by Richardson Dopp, PA  for follow-up.  During that visit she was seen virtually for mobility.  Her blood pressure was well-controlled and patient is statin intolerant with elevated LDL and recently followed by Dr. Debara Pickett for consideration of inclisiran.    She had a LHC performed on 05/25/2017 with nonobstructive CAD noted.  She   [name] presents today for overdue follow-up with her daughter and husband.  Since last being seen in the office patient reports that she is doing well from a cardiac perspective with no new complaints currently.  Her blood pressure today is well-controlled 124/80 and heart rate was 67 bpm.  She reports full compliance with her current medication regimen and denies any adverse reactions.  During her previous visit with Richardson Dopp, PA discussion was made regarding referral to lipid clinic for [place].  In discussing referral further patient and daughter politely declined at this time.  She is interested in having her cholesterol checked and we will arrange to have this done when patient is fasting..  Patient denies chest pain, palpitations, dyspnea, PND, orthopnea, nausea, vomiting, dizziness, syncope, edema, weight gain, or early satiety.   Home Medications    Current Outpatient Medications  Medication Sig Dispense Refill   ALPRAZolam (XANAX) 0.25 MG tablet Take 0.25 mg by mouth at bedtime as needed for anxiety or sleep.     carboxymethylcellulose (REFRESH PLUS) 0.5 % SOLN Place 1 drop into both eyes 3 (three) times daily as needed (dry eyes).      Cholecalciferol (VITAMIN D) 2000 units tablet Take 2,000 Units by mouth daily.     clopidogrel (PLAVIX) 75 MG tablet Take 75 mg by mouth daily.     diltiazem (CARDIZEM CD) 180 MG 24 hr capsule TAKE 1 CAPSULE(180 MG) BY MOUTH DAILY 30 capsule 0   docusate sodium (COLACE) 100 MG capsule Take 200 mg by mouth daily.     irbesartan (AVAPRO) 300 MG tablet Take 1 tablet (300 mg total) by mouth daily. 90 tablet 3   Multiple Vitamin (MULTIVITAMIN) tablet  Take 1 tablet by mouth daily.     nebivolol (BYSTOLIC) 2.5 MG tablet TAKE 1 TABLET(2.5 MG) BY MOUTH DAILY. Patient needs appointment for # 90 refills.  Please call office at [phone] to schedule appt. 30 tablet 0   sertraline (ZOLOFT) 100 MG tablet Take 1 tablet by mouth daily.     No current facility-administered medications for this visit.     Review of Systems  Please see the history of present illness.     All other systems reviewed and are otherwise negative except as noted above.  Physical Exam    Wt Readings from Last 3 Encounters:  08/28/22 112 lb (50.8 kg)  07/29/21 121 lb 14.6 oz (55.3 kg)  07/24/21 121 lb 14.6 oz (55.3 kg)   VS: Vitals:   08/28/22 1222  BP: 124/80  ,Body mass index is 20.49 kg/m.  Constitutional:  Appearance: Healthy appearance. Not in distress.  Neck:     Vascular: JVD normal.  Pulmonary:     Effort: Pulmonary effort is normal.     Breath sounds: No wheezing. No rales. Diminished in the bases Cardiovascular:     Normal rate. Regular rhythm. Normal S1. Normal S2.      Murmurs: There is no murmur.  Edema:    Peripheral edema absent.  Abdominal:     Palpations: Abdomen is soft non tender. There is no hepatomegaly.  Skin:    General: Skin is warm and dry.  Neurological:     General: No focal deficit present.     Mental Status: Alert and oriented to person, place and time.     Cranial Nerves: Cranial nerves are intact.  EKG/LABS/Other Studies Reviewed    ECG personally reviewed by me today -sinus rhythm with a rate of 67 bpm and PR interval of 208 ms and no acute changes consistent with previous EKG.  Lab Results  Component Value Date   WBC 12.0 (H) 07/24/2021   HGB 15.6 (H) 07/24/2021   HCT 47.1 (H) 07/24/2021   MCV 95.9 07/24/2021   PLT 241 07/24/2021   Lab Results  Component Value Date   CREATININE 1.08 (H) 07/24/2021   BUN 18 07/24/2021   NA 138 07/24/2021   K 4.1 07/24/2021   CL 104 07/24/2021   CO2 24 07/24/2021    Lab Results  Component Value Date   ALT 18 07/24/2021   AST 25 07/24/2021   ALKPHOS 85 07/24/2021   BILITOT 0.5 07/24/2021   Lab Results  Component Value Date   CHOL 385 (H) 09/13/2017   HDL 47 09/13/2017   LDLCALC 301 (H) 09/13/2017   LDLDIRECT 224.6 12/14/2011   TRIG 183 (H) 09/13/2017   CHOLHDL 8.2 (H) 09/13/2017    Lab Results  Component Value Date   HGBA1C 5.7 (H) 12/13/2015    Assessment & Plan    1.  Nonobstructive CAD: -s/p LHC in 2018 that showed mild nonobstructive CAD -Today patient reports no cardiac complaints since her previous visit. -Continue nebivolol 2.5 mg daily,   2.  Essential hypertension: -Patient's blood pressure today is well-controlled at 124/80 -Continue nebivolol of 2.5 mg daily, irbesartan 300 mg daily, and Cardizem 180 mg daily   3.  History of CVA: -Patient suffered CVA 2017 -Continue Plavix 75 mg currently    4.Hyperlipidemia: -Statin intolerant and discussed referral to lipid clinic however like to see what her lipids are following recheck. -We will recheck lipids and LFTs  Disposition: Follow-up with Mertie Moores, MD as scheduled See Medication Adjustments/Labs and Tests Ordered: Current medicines are reviewed at length with the patient today.  Concerns regarding medicines are outlined above.   Signed, Mable Fill, Marissa Nestle, NP 08/28/2022, 12:25 PM [facility]  Note:  This document was prepared using Dragon voice recognition software and may include unintentional dictation errors.

## 2022-08-28 NOTE — Progress Notes (Unsigned)
Office Visit    Patient Name: Stacey Ramirez Date of Encounter: 08/28/2022  Primary Care Provider:  Deland Pretty, MD Primary Cardiologist:  Mertie Moores, MD Primary Electrophysiologist: None  Chief Complaint    Stacey Ramirez is a 82 y.o. female with PMH of CAD s/p LHC 2018 showing nonobstructive CAD, HTN, HLD, CVA s/p ILR, PVCs s/p event monitor with 5% burden and nonsustained SVT, carotid artery disease with (1-39% bilateral ICA), hypothyroidism who presents today for follow-up of CAD and hypertension.  Past Medical History    Past Medical History:  Diagnosis Date   Adjustment reaction with anxiety 12/20/2015   Aortic stenosis    CAD (coronary artery disease) 06/09/2017   Nuc study 10/18: EF 69, inferolateral perfusion defect-possible small infarct with peri-infarct ischemia; intermediate risk // LHC 10/18: pLAD 25, pLCx 42, pRCA 25 >> med Rx   Carotid stenosis, asymptomatic, bilateral 02/27/2016   Carotid US 5/17: Bilateral ICA 1-39   Cerebrovascular accident (stroke) ([place])    a. 12/2015 - cryptogenic.  S/P MDT Linq.   Difficulty walking    Essential hypertension    Gait disorder 04/07/2016   Heart palpitations 09/06/2012   History of CVA with residual deficit 12/14/2015   History of dizziness    History of echocardiogram    Echo 10/18: Vigorous LVF, EF 65-70, normal wall motion, grade 1 diastolic dysfunction, GLS -21.1%, mild RAE   History of nuclear stress test    Nuclear stress test 10/18: EF 69, inf-lat defect c/w poss infarct with peri-infarct ischemia; Intermediate Risk   Hyperlipidemia    Hypothyroidism    Left hemiparesis (HCC)    LVH (left ventricular hypertrophy)    Mitral regurgitation    a. 12/2015 Echo: EF 60-65%, mild focal basal hypertrophy. No rwma, triv AI, mild MR, mildly dil LA w/o thrombus. No RA thrombus. No PFO.   Palpitations    Prediabetes    Stroke-like symptom 12/13/2015   SVT (supraventricular tachycardia) ([place]) 12/14/2015   Vascular parkinsonism  ([place]) 06/29/2016   Vitamin D deficiency    Past Surgical History:  Procedure Laterality Date   CARDIOVASCULAR STRESS TEST  2002   NORMAL   EP IMPLANTABLE DEVICE N/A 12/17/2015   Procedure: Loop Recorder Insertion;  Surgeon: [name], MD;  Location: [place] [facility];  Service: Cardiovascular;  Laterality: N/A;   LEFT HEART CATH AND CORONARY ANGIOGRAPHY N/A 05/25/2017   Procedure: LEFT HEART CATH AND CORONARY ANGIOGRAPHY;  Surgeon: Sherren Mocha, MD;  Location: [place] [facility];  Service: Cardiovascular;  Laterality: N/A;   LOOP RECORDER IMPLANT     TEE WITHOUT CARDIOVERSION N/A 12/17/2015   Procedure: TRANSESOPHAGEAL ECHOCARDIOGRAM (TEE);  Surgeon: Lelon Perla, MD;  Location: [facility] ENDOSCOPY;  Service: Cardiovascular;  Laterality: N/A;  Pt also needs a LOOP   TRANSTHORACIC ECHOCARDIOGRAM  2008   SHOWED LEFT VENTRICULAR HYPERTROPHY AND MILD AORTIC STENOSIS    Allergies  Allergies  Allergen Reactions   Sulfa Drugs Cross Reactors Itching   Zocor [Simvastatin] Other (See Comments)    Feels like she is going to pass out, weakness   Crestor [Rosuvastatin Calcium]     Muscle weakness   Statins Other (See Comments)    Unable to walk or stand. Unable to walk or stand. Unable to walk or stand.   Sulfa Antibiotics     welts   Micardis [Telmisartan] Other (See Comments)    Feels like she is going to pass out    History of Present  Illness    Stacey Ramirez  is a 82 year old female with the above mention past medical history who presents today for follow-up of CAD and hypertension.  She had a [place] Myoview completed 05/2017 for shortness of breath and palpitations that revealed EF of 69% with intermediate risk.  She underwent LHC performed a restratification on 05/25/2017 that revealed mild nonobstructive CAD in LAD and RCA.  She had a 2D echo completed 05/05/2017 that showed normal heart function with no valvular abnormalities.    [name] was last seen in 04/2021 by Richardson Dopp, PA for follow-up.  During that visit she was seen virtually for mobility.  Her blood pressure was well-controlled and patient is statin intolerant with elevated LDL and recently followed by Dr. Debara Pickett for consideration of inclisiran.    She had a LHC performed on 05/25/2017 with nonobstructive CAD noted.  She  Since last being seen in the office patient reports***.  Patient denies chest pain, palpitations, dyspnea, PND, orthopnea, nausea, vomiting, dizziness, syncope, edema, weight gain, or early satiety.   ***Notes:  Home Medications    Current Outpatient Medications  Medication Sig Dispense Refill   ALPRAZolam (XANAX) 0.25 MG tablet Take 0.25 mg by mouth at bedtime as needed for anxiety or sleep.     Calcium Citrate (CITRACAL PO) Take by mouth daily.     carboxymethylcellulose (REFRESH PLUS) 0.5 % SOLN Place 1 drop into both eyes 3 (three) times daily as needed (dry eyes).      Cholecalciferol (VITAMIN D) 2000 units tablet Take 2,000 Units by mouth daily.     diltiazem (CARDIZEM CD) 180 MG 24 hr capsule TAKE 1 CAPSULE(180 MG) BY MOUTH DAILY 30 capsule 0   docusate sodium (COLACE) 100 MG capsule Take 200 mg by mouth daily.     HYDROcodone-acetaminophen (NORCO/VICODIN) 5-325 MG tablet Take 1 tablet by mouth every 6 (six) hours as needed. 8 tablet 0   irbesartan (AVAPRO) 300 MG tablet Take 1 tablet (300 mg total) by mouth daily. 90 tablet 3   levothyroxine (SYNTHROID, LEVOTHROID) 25 MCG tablet Take 25 mcg by mouth daily before breakfast.   6   Multiple Vitamin (MULTIVITAMIN) tablet Take 1 tablet by mouth daily.     nebivolol (BYSTOLIC) 2.5 MG tablet TAKE 1 TABLET(2.5 MG) BY MOUTH DAILY. Patient needs appointment for # 90 refills.  Please call office at [phone] to schedule appt. 30 tablet 0   sertraline (ZOLOFT) 100 MG tablet Take 1 tablet by mouth daily.     trospium (SANCTURA) 20 MG tablet Take 20 mg by mouth 2 (two) times daily.     No current facility-administered medications  for this visit.     Review of Systems  Please see the history of present illness.    (+)*** (+)***  All other systems reviewed and are otherwise negative except as noted above.  Physical Exam    Wt Readings from Last 3 Encounters:  07/29/21 121 lb 14.6 oz (55.3 kg)  07/24/21 121 lb 14.6 oz (55.3 kg)  04/16/21 122 lb (55.3 kg)   CX:KGYJE were no vitals filed for this visit.,There is no height or weight on file to calculate BMI.  Constitutional:      Appearance: Healthy appearance. Not in distress.  Neck:     Vascular: JVD normal.  Pulmonary:     Effort: Pulmonary effort is normal.     Breath sounds: No wheezing. No rales. Diminished in the bases Cardiovascular:     Normal  rate. Regular rhythm. Normal S1. Normal S2.      Murmurs: There is no murmur.  Edema:    Peripheral edema absent.  Abdominal:     Palpations: Abdomen is soft non tender. There is no hepatomegaly.  Skin:    General: Skin is warm and dry.  Neurological:     General: No focal deficit present.     Mental Status: Alert and oriented to person, place and time.     Cranial Nerves: Cranial nerves are intact.  EKG/LABS/Other Studies Reviewed    ECG personally reviewed by me today - ***  Risk Assessment/Calculations:   {Does this patient have ATRIAL FIBRILLATION?[phone]}        Lab Results  Component Value Date   WBC 12.0 (H) 07/24/2021   HGB 15.6 (H) 07/24/2021   HCT 47.1 (H) 07/24/2021   MCV 95.9 07/24/2021   PLT 241 07/24/2021   Lab Results  Component Value Date   CREATININE 1.08 (H) 07/24/2021   BUN 18 07/24/2021   NA 138 07/24/2021   K 4.1 07/24/2021   CL 104 07/24/2021   CO2 24 07/24/2021   Lab Results  Component Value Date   ALT 18 07/24/2021   AST 25 07/24/2021   ALKPHOS 85 07/24/2021   BILITOT 0.5 07/24/2021   Lab Results  Component Value Date   CHOL 385 (H) 09/13/2017   HDL 47 09/13/2017   LDLCALC 301 (H) 09/13/2017   LDLDIRECT 224.6 12/14/2011   TRIG 183 (H)  09/13/2017   CHOLHDL 8.2 (H) 09/13/2017    Lab Results  Component Value Date   HGBA1C 5.7 (H) 12/13/2015    Assessment & Plan    1.  Nonobstructive CAD: -s/p LHC in 2018 that showed mild nonobstructive CAD -Today patient reports*** -Continue nebivolol 2.5 mg daily,  2.  Essential hypertension: -Patient's blood pressure today is*** -  3.  History of CVA: -Patient suffered CVA 2017 and was on Plavix currently is  4.***      Disposition: Follow-up with Mertie Moores, MD or APP in *** months {Are you ordering a CV Procedure (e.g. stress test, cath, DCCV, TEE, etc)?   Press F2        :[phone]}   Medication Adjustments/Labs and Tests Ordered: Current medicines are reviewed at length with the patient today.  Concerns regarding medicines are outlined above.   Signed, Mable Fill, Marissa Nestle, NP 08/28/2022, 12:05 PM [facility]  Note:  This document was prepared using Dragon voice recognition software and may include unintentional dictation errors.

## 2022-12-30 DIAGNOSIS — H1013 Acute atopic conjunctivitis, bilateral: Secondary | ICD-10-CM | POA: Diagnosis not present

## 2022-12-30 DIAGNOSIS — Z6823 Body mass index (BMI) 23.0-23.9, adult: Secondary | ICD-10-CM | POA: Diagnosis not present

## 2023-07-17 ENCOUNTER — Other Ambulatory Visit: Payer: Self-pay | Admitting: Physician Assistant

## 2023-07-19 ENCOUNTER — Other Ambulatory Visit: Payer: Self-pay

## 2023-07-19 MED ORDER — IRBESARTAN 300 MG PO TABS: 300.0000 mg | ORAL_TABLET | Freq: Every day | ORAL | 0 refills | Status: DC

## 2023-08-02 DIAGNOSIS — F0280 Dementia in other diseases classified elsewhere without behavioral disturbance: Secondary | ICD-10-CM | POA: Diagnosis not present

## 2023-08-02 DIAGNOSIS — Z23 Encounter for immunization: Secondary | ICD-10-CM | POA: Diagnosis not present

## 2023-08-02 DIAGNOSIS — I10 Essential (primary) hypertension: Secondary | ICD-10-CM | POA: Diagnosis not present

## 2023-08-02 DIAGNOSIS — E040 Nontoxic diffuse goiter: Secondary | ICD-10-CM | POA: Diagnosis not present

## 2023-08-02 DIAGNOSIS — E039 Hypothyroidism, unspecified: Secondary | ICD-10-CM | POA: Diagnosis not present

## 2023-08-02 DIAGNOSIS — Z8673 Personal history of transient ischemic attack (TIA), and cerebral infarction without residual deficits: Secondary | ICD-10-CM | POA: Diagnosis not present

## 2023-08-02 DIAGNOSIS — G309 Alzheimer's disease, unspecified: Secondary | ICD-10-CM | POA: Diagnosis not present

## 2023-08-02 DIAGNOSIS — Z0000 Encounter for general adult medical examination without abnormal findings: Secondary | ICD-10-CM | POA: Diagnosis not present

## 2023-08-02 DIAGNOSIS — M810 Age-related osteoporosis without current pathological fracture: Secondary | ICD-10-CM | POA: Diagnosis not present

## 2023-08-16 ENCOUNTER — Other Ambulatory Visit: Payer: Self-pay | Admitting: Cardiovascular Disease

## 2023-08-30 ENCOUNTER — Other Ambulatory Visit: Payer: Self-pay | Admitting: Cardiovascular Disease

## 2023-09-10 ENCOUNTER — Other Ambulatory Visit: Payer: Self-pay | Admitting: Cardiovascular Disease

## 2023-10-01 ENCOUNTER — Other Ambulatory Visit: Payer: Self-pay | Admitting: Cardiovascular Disease

## 2023-10-01 ENCOUNTER — Encounter (HOSPITAL_COMMUNITY): Payer: Self-pay | Admitting: Emergency Medicine

## 2023-10-01 ENCOUNTER — Other Ambulatory Visit: Payer: Self-pay

## 2023-10-01 ENCOUNTER — Observation Stay (HOSPITAL_COMMUNITY)
Admission: EM | Admit: 2023-10-01 | Discharge: 2023-10-04 | Disposition: A | Discharge status: Home / Self Care | Payer: PPO | Attending: Emergency Medicine | Admitting: Emergency Medicine

## 2023-10-01 DIAGNOSIS — E039 Hypothyroidism, unspecified: Secondary | ICD-10-CM | POA: Diagnosis not present

## 2023-10-01 DIAGNOSIS — F0280 Dementia in other diseases classified elsewhere without behavioral disturbance: Secondary | ICD-10-CM | POA: Diagnosis not present

## 2023-10-01 DIAGNOSIS — I2510 Atherosclerotic heart disease of native coronary artery without angina pectoris: Secondary | ICD-10-CM | POA: Insufficient documentation

## 2023-10-01 DIAGNOSIS — Z950 Presence of cardiac pacemaker: Secondary | ICD-10-CM | POA: Insufficient documentation

## 2023-10-01 DIAGNOSIS — K921 Melena: Secondary | ICD-10-CM | POA: Diagnosis not present

## 2023-10-01 DIAGNOSIS — Z7902 Long term (current) use of antithrombotics/antiplatelets: Secondary | ICD-10-CM | POA: Insufficient documentation

## 2023-10-01 DIAGNOSIS — K922 Gastrointestinal hemorrhage, unspecified: Secondary | ICD-10-CM | POA: Diagnosis not present

## 2023-10-01 DIAGNOSIS — G309 Alzheimer's disease, unspecified: Secondary | ICD-10-CM | POA: Insufficient documentation

## 2023-10-01 DIAGNOSIS — K625 Hemorrhage of anus and rectum: Secondary | ICD-10-CM | POA: Diagnosis present

## 2023-10-01 DIAGNOSIS — Z8673 Personal history of transient ischemic attack (TIA), and cerebral infarction without residual deficits: Secondary | ICD-10-CM | POA: Diagnosis not present

## 2023-10-01 DIAGNOSIS — R58 Hemorrhage, not elsewhere classified: Secondary | ICD-10-CM | POA: Diagnosis not present

## 2023-10-01 DIAGNOSIS — I10 Essential (primary) hypertension: Secondary | ICD-10-CM | POA: Insufficient documentation

## 2023-10-01 DIAGNOSIS — Z79899 Other long term (current) drug therapy: Secondary | ICD-10-CM | POA: Insufficient documentation

## 2023-10-01 LAB — COMPREHENSIVE METABOLIC PANEL
ALT: 15 U/L (ref 0–44)
AST: 19 U/L (ref 15–41)
Albumin: 4.3 g/dL (ref 3.5–5.0)
Alkaline Phosphatase: 71 U/L (ref 38–126)
Anion gap: 10 (ref 5–15)
BUN: 22 mg/dL (ref 8–23)
CO2: 27 mmol/L (ref 22–32)
Calcium: 10.1 mg/dL (ref 8.9–10.3)
Chloride: 102 mmol/L (ref 98–111)
Creatinine, Ser: 0.89 mg/dL (ref 0.44–1.00)
GFR, Estimated: >60 mL/min (ref >60)
Glucose, Bld: 88 mg/dL (ref 70–99)
Potassium: 3.7 mmol/L (ref 3.5–5.1)
Sodium: 139 mmol/L (ref 135–145)
Total Bilirubin: 0.6 mg/dL (ref 0.0–1.2)
Total Protein: 8.1 g/dL (ref 6.5–8.1)

## 2023-10-01 LAB — CBC
HCT: 45.4 % (ref 36.0–46.0)
Hemoglobin: 15 g/dL (ref 12.0–15.0)
MCH: 31.6 pg (ref 26.0–34.0)
MCHC: 33 g/dL (ref 30.0–36.0)
MCV: 95.8 fL (ref 80.0–100.0)
Platelets: 259 10*3/uL (ref 150–400)
RBC: 4.74 MIL/uL (ref 3.87–5.11)
RDW: 12.8 % (ref 11.5–15.5)
WBC: 10.5 10*3/uL (ref 4.0–10.5)
nRBC: 0 % (ref 0.0–0.2)

## 2023-10-01 NOTE — ED Triage Notes (Signed)
 Patient presents from home due to Home Health call around 6:30 PM due to signs of a rectal bleed. EMS noted a small amount of bright red blood. Patient has no complaints.   HX: dementia, wheelchair bound, Plavix use, hypertension  EMS vitals: 178/76 BP 65 HR 98% SPO2 on room air 104 CBG

## 2023-10-01 NOTE — Telephone Encounter (Signed)
 Dr. Harvie Bridge pt. She is passed her 3rd attempt. Does Dr. Elease Hashimoto want to refill? Please advise.

## 2023-10-02 DIAGNOSIS — I2510 Atherosclerotic heart disease of native coronary artery without angina pectoris: Secondary | ICD-10-CM

## 2023-10-02 DIAGNOSIS — K922 Gastrointestinal hemorrhage, unspecified: Secondary | ICD-10-CM | POA: Diagnosis not present

## 2023-10-02 DIAGNOSIS — Z8673 Personal history of transient ischemic attack (TIA), and cerebral infarction without residual deficits: Secondary | ICD-10-CM

## 2023-10-02 DIAGNOSIS — E039 Hypothyroidism, unspecified: Secondary | ICD-10-CM

## 2023-10-02 DIAGNOSIS — K625 Hemorrhage of anus and rectum: Secondary | ICD-10-CM | POA: Diagnosis not present

## 2023-10-02 LAB — POC OCCULT BLOOD, ED: Fecal Occult Bld: POSITIVE — AB

## 2023-10-02 LAB — ABO/RH: ABO/RH(D): O NEG

## 2023-10-02 LAB — TYPE AND SCREEN
ABO/RH(D): O NEG
Antibody Screen: NEGATIVE

## 2023-10-02 LAB — HEMOGLOBIN AND HEMATOCRIT, BLOOD
HCT: 38.5 % (ref 36.0–46.0)
Hemoglobin: 12.7 g/dL (ref 12.0–15.0)

## 2023-10-02 LAB — TSH: TSH: 2.644 u[IU]/mL (ref 0.350–4.500)

## 2023-10-02 MED ORDER — ACETAMINOPHEN 325 MG PO TABS: 650.0000 mg | ORAL_TABLET | Freq: Four times a day (QID) | ORAL | Status: DC | PRN

## 2023-10-02 MED ORDER — ACETAMINOPHEN 650 MG RE SUPP: 650.0000 mg | Freq: Four times a day (QID) | RECTAL | Status: DC | PRN

## 2023-10-02 MED ORDER — SODIUM CHLORIDE 0.9 % IV SOLN: INTRAVENOUS | Status: AC

## 2023-10-02 NOTE — ED Provider Notes (Signed)
 [place] EMERGENCY DEPARTMENT AT [facility] Provider Note   CSN: [id number] Arrival date & time: 10/01/23  2023     History  Chief Complaint  Patient presents with   Rectal Bleeding    Stacey Ramirez is a 83 y.o. female.  The history is provided by the spouse. The history is limited by the condition of the patient (level 5 caveat).  Rectal Bleeding Quality:  Bright red and maroon Amount:  Copious Duration:  7 hours Timing:  Constant Chronicity:  New Context: spontaneously   Context: not rectal injury   Similar prior episodes: no   Relieved by:  Nothing Worsened by:  Nothing Ineffective treatments:  None tried Associated symptoms: no abdominal pain and no fever   Risk factors: anticoagulant use   Patient with dementia with 7 hours of copious BRBPR.      Home Medications Prior to Admission medications   Medication Sig Start Date End Date Taking? Authorizing Provider  clopidogrel (PLAVIX) 75 MG tablet Take 1 tablet (75 mg total) by mouth daily. 08/28/22  Yes Nahser, Deloris Ping, MD  diltiazem (CARDIZEM CD) 180 MG 24 hr capsule TAKE 1 CAPSULE(180 MG) BY MOUTH DAILY 09/10/23  Yes Nahser, Deloris Ping, MD  irbesartan (AVAPRO) 300 MG tablet Take 1 tablet (300 mg total) by mouth daily. 07/19/23  Yes Nahser, Deloris Ping, MD  nebivolol (BYSTOLIC) 2.5 MG tablet TAKE 1 TABLET(2.5 MG) BY MOUTH DAILY 08/17/23  Yes Nahser, Deloris Ping, MD  sertraline (ZOLOFT) 100 MG tablet Take 1 tablet by mouth daily. 08/23/18  Yes [provider]      Allergies    Sulfa drugs cross reactors, Zocor [simvastatin], Crestor [rosuvastatin calcium], Statins, Sulfa antibiotics, and Micardis [telmisartan]    Review of Systems   Review of Systems  Unable to perform ROS: Dementia  Constitutional:  Negative for fever.  HENT:  Negative for ear discharge.   Eyes:  Negative for photophobia and redness.  Respiratory:  Negative for shortness of breath and stridor.   Gastrointestinal:  Positive for  hematochezia. Negative for abdominal pain.  Genitourinary:  Negative for dysuria.    Physical Exam Updated Vital Signs BP 132/71   Pulse 65   Temp 98.2 F (36.8 C) (Oral)   Resp 16   Ht 5\' 2"  (1.575 m)   SpO2 94%   BMI 20.49 kg/m  Physical Exam Vitals and nursing note reviewed. Exam conducted with a chaperone present.  Constitutional:      General: She is not in acute distress.    Appearance: Normal appearance. She is well-developed.  HENT:     Head: Normocephalic and atraumatic.     Nose: Nose normal.  Eyes:     Pupils: Pupils are equal, round, and reactive to light.  Cardiovascular:     Rate and Rhythm: Normal rate and regular rhythm.     Pulses: Normal pulses.     Heart sounds: Normal heart sounds.  Pulmonary:     Effort: Pulmonary effort is normal. No respiratory distress.     Breath sounds: Normal breath sounds.  Abdominal:     General: Bowel sounds are normal. There is no distension.     Palpations: Abdomen is soft.     Tenderness: There is no abdominal tenderness. There is no guarding or rebound.  Genitourinary:    Rectum: Guaiac result positive.     Comments: Grossly bloody  Musculoskeletal:        General: Normal range of motion.  Cervical back: Normal range of motion and neck supple.  Skin:    General: Skin is warm and dry.     Capillary Refill: Capillary refill takes less than 2 seconds.     Findings: No erythema or rash.  Neurological:     Mental Status: She is alert.     Deep Tendon Reflexes: Reflexes normal.  Psychiatric:        Mood and Affect: Mood normal.     ED Results / Procedures / Treatments   Labs (all labs ordered are listed, but only abnormal results are displayed) Results for orders placed or performed during the hospital encounter of 10/01/23  Comprehensive metabolic panel   Collection Time: 10/01/23  9:07 PM  Result Value Ref Range   Sodium 139 135 - 145 mmol/L   Potassium 3.7 3.5 - 5.1 mmol/L   Chloride 102 98 - 111 mmol/L    CO2 27 22 - 32 mmol/L   Glucose, Bld 88 70 - 99 mg/dL   BUN 22 8 - 23 mg/dL   Creatinine, Ser 6.96 0.44 - 1.00 mg/dL   Calcium 29.5 8.9 - 28.4 mg/dL   Total Protein 8.1 6.5 - 8.1 g/dL   Albumin 4.3 3.5 - 5.0 g/dL   AST 19 15 - 41 U/L   ALT 15 0 - 44 U/L   Alkaline Phosphatase 71 38 - 126 U/L   Total Bilirubin 0.6 0.0 - 1.2 mg/dL   GFR, Estimated >13 >24 mL/min   Anion gap 10 5 - 15  CBC   Collection Time: 10/01/23  9:07 PM  Result Value Ref Range   WBC 10.5 4.0 - 10.5 K/uL   RBC 4.74 3.87 - 5.11 MIL/uL   Hemoglobin 15.0 12.0 - 15.0 g/dL   HCT 40.1 02.7 - 25.3 %   MCV 95.8 80.0 - 100.0 fL   MCH 31.6 26.0 - 34.0 pg   MCHC 33.0 30.0 - 36.0 g/dL   RDW 66.4 40.3 - 47.4 %   Platelets 259 150 - 400 K/uL   nRBC 0.0 0.0 - 0.2 %  Type and screen [facility]   Collection Time: 10/01/23  9:07 PM  Result Value Ref Range   ABO/RH(D) O NEG    Antibody Screen NEG    Sample Expiration      10/04/2023,2359 Performed at [facility], [address], [place], [place] [address]   POC occult blood, ED   Collection Time: 10/02/23  2:16 AM  Result Value Ref Range   Fecal Occult Bld POSITIVE (A) NEGATIVE   No results found.   Radiology No results found.  Procedures Procedures    Medications Ordered in ED Medications - No data to display  ED Course/ Medical Decision Making/ A&P                                 Medical Decision Making Patient with grossly bloody stools since 6 pm  Amount and/or Complexity of Data Reviewed Independent Historian: spouse    Details: See above  External Data Reviewed: notes.    Details: Previous notes reviewed  Labs: ordered.    Details: Hemoccult positive, normal white count 10.5, normal hemoglobin 15, normal platelets.  Normal sodium 139, normal potassium 3.7, normal creatinine  Discussion of management or test interpretation with external provider(s): Dr. Bosie Clos per chat will see patient Dr. Loney Loh  who will admit   Risk Decision regarding  hospitalization.    Final Clinical Impression(s) / ED Diagnoses Final diagnoses:  Acute GI bleeding   The patient appears reasonably stabilized for admission considering the current resources, flow, and capabilities available in the ED at this time, and I doubt any other [facility] requiring further screening and/or treatment in the ED prior to admission.  Rx / DC Orders ED Discharge Orders     None         Krystena Reitter, MD 10/02/23 [phone]

## 2023-10-02 NOTE — Consult Note (Signed)
 Referring Provider: Dr. Alanda Slim Primary Care Physician:  Merri Brunette, MD Primary Gastroenterologist:  Dr. Madilyn Fireman  Reason for Consultation:  Rectal bleeding  HPI: Stacey Ramirez is a 83 y.o. female with acute onset of bright red blood per rectum X 1 yesterday that her daughter witnessed without any associated abdominal pain, N/V, melena. No bleeding or BMs since that episode yesterday evening. Patient has dementia and unable to give me any history. Daughter and pt's husband in room. Colonoscopy in 08/2015 by Dr. Madilyn Fireman showed left-sided diverticulosis and hyperplastic polyps. Hgb 15 yesterday and 12.7 today. On Plavix for history of stroke.  Past Medical History:  Diagnosis Date   Adjustment reaction with anxiety 12/20/2015   Aortic stenosis    CAD (coronary artery disease) 06/09/2017   Nuc study 10/18: EF 69, inferolateral perfusion defect-possible small infarct with peri-infarct ischemia; intermediate risk // LHC 10/18: pLAD 25, pLCx 50, pRCA 25 >> med Rx   Carotid stenosis, asymptomatic, bilateral 02/27/2016   Carotid US 5/17: Bilateral ICA 1-39   Cerebrovascular accident (stroke) ([facility])    a. 12/2015 - cryptogenic.  S/P MDT Linq.   Difficulty walking    Essential hypertension    Gait disorder 04/07/2016   Heart palpitations 09/06/2012   History of CVA with residual deficit 12/14/2015   History of dizziness    History of echocardiogram    Echo 10/18: Vigorous LVF, EF 65-70, normal wall motion, grade 1 diastolic dysfunction, GLS -21.1%, mild RAE   History of nuclear stress test    Nuclear stress test 10/18: EF 69, inf-lat defect c/w poss infarct with peri-infarct ischemia; Intermediate Risk   Hyperlipidemia    Hypothyroidism    Left hemiparesis (HCC)    LVH (left ventricular hypertrophy)    Mitral regurgitation    a. 12/2015 Echo: EF 60-65%, mild focal basal hypertrophy. No rwma, triv AI, mild MR, mildly dil LA w/o thrombus. No RA thrombus. No PFO.   Palpitations    Prediabetes    Stroke-like  symptom 12/13/2015   SVT (supraventricular tachycardia) (HCC) 12/14/2015   Vascular parkinsonism (HCC) 06/29/2016   Vitamin D deficiency     Past Surgical History:  Procedure Laterality Date   CARDIOVASCULAR STRESS TEST  2002   NORMAL   EP IMPLANTABLE DEVICE N/A 12/17/2015   Procedure: Loop Recorder Insertion;  Surgeon: Hillis Range, MD;  Location: [facility] INVASIVE CV LAB;  Service: Cardiovascular;  Laterality: N/A;   LEFT HEART CATH AND CORONARY ANGIOGRAPHY N/A 05/25/2017   Procedure: LEFT HEART CATH AND CORONARY ANGIOGRAPHY;  Surgeon: Tonny Bollman, MD;  Location: [facility] INVASIVE CV LAB;  Service: Cardiovascular;  Laterality: N/A;   LOOP RECORDER IMPLANT     TEE WITHOUT CARDIOVERSION N/A 12/17/2015   Procedure: TRANSESOPHAGEAL ECHOCARDIOGRAM (TEE);  Surgeon: Lewayne Bunting, MD;  Location: [facility] ENDOSCOPY;  Service: Cardiovascular;  Laterality: N/A;  Pt also needs a LOOP   TRANSTHORACIC ECHOCARDIOGRAM  2008   SHOWED LEFT VENTRICULAR HYPERTROPHY AND MILD AORTIC STENOSIS    Prior to Admission medications   Medication Sig Start Date End Date Taking? Authorizing Provider  clopidogrel (PLAVIX) 75 MG tablet Take 1 tablet (75 mg total) by mouth daily. 08/28/22  Yes Nahser, Deloris Ping, MD  diltiazem (CARDIZEM CD) 180 MG 24 hr capsule TAKE 1 CAPSULE(180 MG) BY MOUTH DAILY 09/10/23  Yes Nahser, Deloris Ping, MD  irbesartan (AVAPRO) 300 MG tablet Take 1 tablet (300 mg total) by mouth daily. 07/19/23  Yes Nahser, Deloris Ping, MD  nebivolol (BYSTOLIC) 2.5 MG tablet TAKE  1 TABLET(2.5 MG) BY MOUTH DAILY 08/17/23  Yes Nahser, Deloris Ping, MD  sertraline (ZOLOFT) 100 MG tablet Take 1 tablet by mouth daily. 08/23/18  Yes [provider]    Scheduled Meds: Continuous Infusions:  sodium chloride 125 mL/hr at 10/02/23 0536   PRN Meds:.acetaminophen **OR** acetaminophen  Allergies as of 10/01/2023 - Review Complete 10/01/2023  Allergen Reaction Noted   Sulfa drugs cross reactors Itching 12/24/2010   Zocor  [simvastatin] Other (See Comments) 12/24/2010   Crestor [rosuvastatin calcium]  01/23/2016   Statins Other (See Comments) 05/12/2016   Sulfa antibiotics  05/12/2016   Micardis [telmisartan] Other (See Comments) 12/24/2010    Family History  Problem Relation Age of Onset   Heart attack Mother    Alzheimer's disease Mother    Stroke Paternal Uncle     Social History   Socioeconomic History   Marital status: Married    Spouse name: Not on file   Number of children: 2   Years of education: college   Highest education level: Not on file  Occupational History   Occupation: retired  Tobacco Use   Smoking status: Never   Smokeless tobacco: Never  Vaping Use   Vaping status: Never Used  Substance and Sexual Activity   Alcohol use: Yes    Alcohol/week: 1.0 standard drink of alcohol    Types: 1 Glasses of wine per week    Comment: socially   Drug use: No   Sexual activity: Not on file  Other Topics Concern   Not on file  Social History Narrative   Not on file   Social Drivers of Health   Financial Resource Strain: Not on file  Food Insecurity: No Food Insecurity (10/02/2023)   Hunger Vital Sign    Worried About Running Out of Food in the Last Year: Never true    Ran Out of Food in the Last Year: Never true  Transportation Needs: No Transportation Needs (10/02/2023)   PRAPARE - Administrator, Civil Service (Medical): No    Lack of Transportation (Non-Medical): No  Physical Activity: Not on file  Stress: Not on file  Social Connections: Unknown (10/02/2023)   Social Connection and Isolation Panel [NHANES]    Frequency of Communication with Friends and Family: Patient unable to answer    Frequency of Social Gatherings with Friends and Family: Patient unable to answer    Attends Religious Services: Patient unable to answer    Active Member of Clubs or Organizations: No    Attends Banker Meetings: Never    Marital Status: Married  Catering manager  Violence: Not At Risk (10/02/2023)   Humiliation, Afraid, Rape, and Kick questionnaire    Fear of Current or Ex-Partner: No    Emotionally Abused: No    Physically Abused: No    Sexually Abused: No    Review of Systems: All negative except as stated above in HPI.  Physical Exam: Vital signs: Vitals:   10/02/23 1219 10/02/23 1249  BP:  (!) 165/88  Pulse:  71  Resp:  16  Temp: 97.7 F (36.5 C)   SpO2:  97%   Last BM Date : 10/01/23 General:   Lethargic, elderly, demented, thin, no acute distress, pleasant  Head: normocephalic, atraumatic Eyes: anicteric sclera ENT: oropharynx clear Neck: supple, nontender Lungs:  Clear throughout to auscultation.   No wheezes, crackles, or rhonchi. No acute distress. Heart:  Regular rate and rhythm; no murmurs, clicks, rubs,  or gallops. Abdomen: soft,  nontender, nondistended, +BS  Rectal:  Deferred Ext: no edema  GI:  Lab Results: Recent Labs    10/01/23 2107 10/02/23 0537  WBC 10.5  --   HGB 15.0 12.7  HCT 45.4 38.5  PLT 259  --    BMET Recent Labs    10/01/23 2107  NA 139  K 3.7  CL 102  CO2 27  GLUCOSE 88  BUN 22  CREATININE 0.89  CALCIUM 10.1   LFT Recent Labs    10/01/23 2107  PROT 8.1  ALBUMIN 4.3  AST 19  ALT 15  ALKPHOS 71  BILITOT 0.6   PT/INR No results for input(s): "LABPROT", "INR" in the last 72 hours.   Studies/Results: No results found.  Impression/Plan: Painless hematochezia X 1 likely due to diverticular bleeding without signs of ongoing bleeding. Manage conservatively and would not pursue a colonoscopy unless bleeding recurs and persists. Clear liquid diet ok. Supportive care. Will follow.    LOS: 0 days   Shirley Friar  10/02/2023, 2:44 PM  Questions please call [phone]

## 2023-10-02 NOTE — H&P (Signed)
 History and Physical    Stacey Ramirez HQI:[id number] DOB: 1941-07-30 DOA: 10/01/2023  PCP: Merri Brunette, MD  Patient coming from: Home  Chief Complaint: Rectal bleeding  HPI: Stacey Ramirez is a 83 y.o. female with medical history significant of dementia, nonobstructive CAD, hypertension, hyperlipidemia, history of CVA in 2017 on Plavix, hypothyroidism, prediabetes presented to the ED via EMS for evaluation of acute onset rectal bleeding/bright red blood per rectum.  Vital signs on arrival: Temperature 98.9 F, pulse 65, respiratory rate 18, blood pressure 170/90, and SpO2 100% on room air.  Hemoglobin 15.0, FOBT positive.  ED physician has messaged Dr. Bosie Clos with GI requesting consultation in the morning.  [facility] called to admit.  Patient has dementia and history given mostly by her husband at bedside.  Husband states around 6:30 PM yesterday patient had one episode of large-volume rectal bleeding.  She has not complained of any abdominal pain and has not vomited blood.  She takes Plavix due to history of previous stroke which left her with left-sided weakness.  Husband states patient had a similar episode of rectal bleeding a year ago for which she was admitted to an outside hospital and had several tests done but they could not find out what was causing her to bleed.  Patient denies abdominal pain.  She denies lightheadedness/dizziness, chest pain, or shortness of breath.  Review of Systems:  Review of Systems  All other systems reviewed and are negative.   Past Medical History:  Diagnosis Date   Adjustment reaction with anxiety 12/20/2015   Aortic stenosis    CAD (coronary artery disease) 06/09/2017   Nuc study 10/18: EF 69, inferolateral perfusion defect-possible small infarct with peri-infarct ischemia; intermediate risk // LHC 10/18: pLAD 25, pLCx 50, pRCA 25 >> med Rx   Carotid stenosis, asymptomatic, bilateral 02/27/2016   Carotid US 5/17: Bilateral ICA 1-39   Cerebrovascular accident  (stroke) (HCC)    a. 12/2015 - cryptogenic.  S/P MDT Linq.   Difficulty walking    Essential hypertension    Gait disorder 04/07/2016   Heart palpitations 09/06/2012   History of CVA with residual deficit 12/14/2015   History of dizziness    History of echocardiogram    Echo 10/18: Vigorous LVF, EF 65-70, normal wall motion, grade 1 diastolic dysfunction, GLS -21.1%, mild RAE   History of nuclear stress test    Nuclear stress test 10/18: EF 69, inf-lat defect c/w poss infarct with peri-infarct ischemia; Intermediate Risk   Hyperlipidemia    Hypothyroidism    Left hemiparesis (HCC)    LVH (left ventricular hypertrophy)    Mitral regurgitation    a. 12/2015 Echo: EF 60-65%, mild focal basal hypertrophy. No rwma, triv AI, mild MR, mildly dil LA w/o thrombus. No RA thrombus. No PFO.   Palpitations    Prediabetes    Stroke-like symptom 12/13/2015   SVT (supraventricular tachycardia) (HCC) 12/14/2015   Vascular parkinsonism (HCC) 06/29/2016   Vitamin D deficiency     Past Surgical History:  Procedure Laterality Date   CARDIOVASCULAR STRESS TEST  2002   NORMAL   EP IMPLANTABLE DEVICE N/A 12/17/2015   Procedure: Loop Recorder Insertion;  Surgeon: Hillis Range, MD;  Location: [facility] INVASIVE CV LAB;  Service: Cardiovascular;  Laterality: N/A;   LEFT HEART CATH AND CORONARY ANGIOGRAPHY N/A 05/25/2017   Procedure: LEFT HEART CATH AND CORONARY ANGIOGRAPHY;  Surgeon: Tonny Bollman, MD;  Location: [facility] INVASIVE CV LAB;  Service: Cardiovascular;  Laterality: N/A;   LOOP RECORDER  IMPLANT     TEE WITHOUT CARDIOVERSION N/A 12/17/2015   Procedure: TRANSESOPHAGEAL ECHOCARDIOGRAM (TEE);  Surgeon: Lewayne Bunting, MD;  Location: [facility] ENDOSCOPY;  Service: Cardiovascular;  Laterality: N/A;  Pt also needs a LOOP   TRANSTHORACIC ECHOCARDIOGRAM  2008   SHOWED LEFT VENTRICULAR HYPERTROPHY AND MILD AORTIC STENOSIS     reports that she has never smoked. She has never used smokeless tobacco. She reports current alcohol  use of about 1.0 standard drink of alcohol per week. She reports that she does not use drugs.  Allergies  Allergen Reactions   Sulfa Drugs Cross Reactors Itching   Zocor [Simvastatin] Other (See Comments)    Feels like she is going to pass out, weakness   Crestor [Rosuvastatin Calcium]     Muscle weakness   Statins Other (See Comments)    Unable to walk or stand. Unable to walk or stand. Unable to walk or stand.   Sulfa Antibiotics     welts   Micardis [Telmisartan] Other (See Comments)    Feels like she is going to pass out    Family History  Problem Relation Age of Onset   Heart attack Mother    Alzheimer's disease Mother    Stroke Paternal Uncle     Prior to Admission medications   Medication Sig Start Date End Date Taking? Authorizing Provider  clopidogrel (PLAVIX) 75 MG tablet Take 1 tablet (75 mg total) by mouth daily. 08/28/22  Yes Nahser, Deloris Ping, MD  diltiazem (CARDIZEM CD) 180 MG 24 hr capsule TAKE 1 CAPSULE(180 MG) BY MOUTH DAILY 09/10/23  Yes Nahser, Deloris Ping, MD  irbesartan (AVAPRO) 300 MG tablet Take 1 tablet (300 mg total) by mouth daily. 07/19/23  Yes Nahser, Deloris Ping, MD  nebivolol (BYSTOLIC) 2.5 MG tablet TAKE 1 TABLET(2.5 MG) BY MOUTH DAILY 08/17/23  Yes Nahser, Deloris Ping, MD  sertraline (ZOLOFT) 100 MG tablet Take 1 tablet by mouth daily. 08/23/18  Yes [provider]    Physical Exam: Vitals:   10/01/23 2338 10/02/23 0142 10/02/23 0200 10/02/23 0435  BP: (!) 164/94 (!) 149/64 132/71 (!) 92/59  Pulse: 64 64 65 62  Resp: 18 16  18   Temp: (!) 97.4 F (36.3 C) 98.2 F (36.8 C)  97.7 F (36.5 C)  TempSrc: Oral Oral  Oral  SpO2: 93% 94% 94% 97%  Height:        Physical Exam Vitals reviewed.  Constitutional:      General: She is not in acute distress. HENT:     Head: Normocephalic and atraumatic.  Eyes:     Extraocular Movements: Extraocular movements intact.  Cardiovascular:     Rate and Rhythm: Normal rate and regular rhythm.     Pulses:  Normal pulses.  Pulmonary:     Effort: Pulmonary effort is normal. No respiratory distress.     Breath sounds: Normal breath sounds. No wheezing or rales.  Abdominal:     General: Bowel sounds are normal. There is no distension.     Palpations: Abdomen is soft.     Tenderness: There is no abdominal tenderness. There is no guarding or rebound.  Musculoskeletal:     Cervical back: Normal range of motion.     Left lower leg: Edema present.     Comments: Left lower extremity edema (chronic per patient's husband)  Skin:    General: Skin is warm and dry.  Neurological:     Mental Status: She is alert. Mental status is at baseline.  Labs on Admission: I have personally reviewed following labs and imaging studies  CBC: Recent Labs  Lab 10/01/23 2107  WBC 10.5  HGB 15.0  HCT 45.4  MCV 95.8  PLT 259   Basic Metabolic Panel: Recent Labs  Lab 10/01/23 2107  NA 139  K 3.7  CL 102  CO2 27  GLUCOSE 88  BUN 22  CREATININE 0.89  CALCIUM 10.1   GFR: CrCl cannot be calculated (Unknown ideal weight.). Liver Function Tests: Recent Labs  Lab 10/01/23 2107  AST 19  ALT 15  ALKPHOS 71  BILITOT 0.6  PROT 8.1  ALBUMIN 4.3   No results for input(s): "LIPASE", "AMYLASE" in the last 168 hours. No results for input(s): "AMMONIA" in the last 168 hours. Coagulation Profile: No results for input(s): "INR", "PROTIME" in the last 168 hours. Cardiac Enzymes: No results for input(s): "CKTOTAL", "CKMB", "CKMBINDEX", "TROPONINI" in the last 168 hours. BNP (last 3 results) No results for input(s): "PROBNP" in the last 8760 hours. HbA1C: No results for input(s): "HGBA1C" in the last 72 hours. CBG: No results for input(s): "GLUCAP" in the last 168 hours. Lipid Profile: No results for input(s): "CHOL", "HDL", "LDLCALC", "TRIG", "CHOLHDL", "LDLDIRECT" in the last 72 hours. Thyroid Function Tests: No results for input(s): "TSH", "T4TOTAL", "FREET4", "T3FREE", "THYROIDAB" in the last 72  hours. Anemia Panel: No results for input(s): "VITAMINB12", "FOLATE", "FERRITIN", "TIBC", "IRON", "RETICCTPCT" in the last 72 hours. Urine analysis:    Component Value Date/Time   COLORURINE STRAW (A) 10/18/2017 1412   APPEARANCEUR CLEAR 10/18/2017 1412   LABSPEC 1.010 10/18/2017 1412   PHURINE 7.5 10/18/2017 1412   GLUCOSEU NEGATIVE 10/18/2017 1412   HGBUR NEGATIVE 10/18/2017 1412   BILIRUBINUR NEGATIVE 10/18/2017 1412   KETONESUR NEGATIVE 10/18/2017 1412   PROTEINUR NEGATIVE 10/18/2017 1412   NITRITE NEGATIVE 10/18/2017 1412   LEUKOCYTESUR NEGATIVE 10/18/2017 1412    Radiological Exams on Admission: No results found.  Assessment and Plan  Acute lower GI bleed/hematochezia She is on Plavix due to history of previous stroke but no anticoagulation.  Hemoglobin 15.0 and no further episodes of hematochezia in the ED.  She remains hemodynamically stable.  SBP currently in the 120s.  Type and screen, monitor H&H.  Keep n.p.o., IV fluid hydration, and hold Plavix.  No previous colonoscopy results in the chart.  Previous CT done in September 2023 at [facility] did show evidence of mild sigmoid diverticulosis.  [facility] consulted.  Nonobstructive CAD: Patient is not endorsing any anginal symptoms. History of CVA in 2017 Hold Plavix at this time in the setting of acute GI bleed.  Hypertension Avoid antihypertensives at this time.  Hypothyroidism Not on thyroid hormone replacement therapy.  Check TSH.  DVT prophylaxis: SCDs Code Status: Full Code (discussed with the patient's husband) Family Communication: Husband at bedside. Consults called: [facility] Level of care: Progressive Care Unit Admission status: It is my clinical opinion that referral for OBSERVATION is reasonable and necessary in this patient based on the above information provided. The aforementioned taken together are felt to place the patient at high risk for further clinical deterioration. However, it is anticipated  that the patient may be medically stable for discharge from the hospital within 24 to 48 hours.  John Giovanni MD [facility]  If 7PM-7AM, please contact night-coverage [email or web address]  10/02/2023, 4:39 AM

## 2023-10-02 NOTE — Progress Notes (Signed)
 PROGRESS NOTE  Stacey Ramirez WGN:[id number] DOB: 12-Jan-1941   PCP: Merri Brunette, MD  Patient is from: Home.  Lives with husband.  Has rolling walker and 24/7 care.  DOA: 10/01/2023 LOS: 0  Chief complaints Chief Complaint  Patient presents with   Rectal Bleeding     Brief Narrative / Interim history: 83 year old F with PMH of Alzheimer's dementia, nonobstructive CAD, CVA in 2017 on Plavix, diverticulosis, HTN, HLD, hypothyroidism and prediabetes presenting with bright red blood per rectum the evening of 09/30/2022, and admitted with working diagnosis of hematochezia.  In ED, she was slightly hypertensive.  Hemoglobin 15.  Hemoccult positive.  [facility] consulted.  Admission requested for overnight observation pending GI evaluation.  Patient has not had further bowel movement or bleeding.   Subjective: Seen and examined earlier this morning.  No major events overnight of this morning.  Patient has severe dementia and not able to provide history.  She is awake and alert but totally disoriented.  No further bowel movements or bleeding per husband at bedside.    Objective: Vitals:   10/02/23 0630 10/02/23 0730 10/02/23 0823 10/02/23 0900  BP: 129/73 (!) 135/110  (!) 124/111  Pulse:  65  73  Resp:  16  16  Temp:   (!) 97 F (36.1 C)   TempSrc:   Axillary   SpO2:  94%  95%  Height:        Examination:  GENERAL: No apparent distress.  Nontoxic. HEENT: MMM.  Vision and hearing grossly intact.  NECK: Supple.  No apparent JVD.  RESP:  No IWOB.  Fair aeration bilaterally. CVS:  RRR. Heart sounds normal.  ABD/GI/GU: BS+. Abd soft, NTND.  MSK/EXT:  Moves extremities. No apparent deformity. No edema.  SKIN: no apparent skin lesion or wound NEURO: Awake and alert.  Disoriented.  No apparent focal neuro deficit. PSYCH: Calm. Normal affect.   Procedures:  None  Microbiology summarized: None  Assessment and plan: Acute lower GI bleed/hematochezia: Single episode the evening of  10/01/2023.  No further bowel movement or bleeding.  Hemoccult positive. Hgb down from 15-12.7.  She was on IV fluid.   Patient is on Plavix.  Has history of diverticulosis per his CT in 2023.  No abdominal pain.  No tenderness. -[facility] consulted in ED. -Continue holding Plavix -N.p.o. pending GI input  Alzheimer's dementia without behavioral disturbance: She is awake and alert but totally disoriented. -Reorientation and delirium precaution -Minimize or avoid sedating medications.   Nonobstructive CAD: No report of cardiopulmonary symptoms. History of CVA in 2017 -Continue holding Plavix in the setting of GI bleed   Hypertension: BP fluctuates.  On Cardizem, Avapro and nebivolol at home. -Continue holding home meds for now   Hypothyroidism: TSH normal. -Continue home Synthroid   Body mass index is 20.49 kg/m.           DVT prophylaxis:  SCDs Start: 10/02/23 7846  Code Status: Full code-confirmed with patient husband at bedside Family Communication: Dated patient's husband at bedside Level of care: Progressive Status is: Observation The patient remains OBS appropriate and will d/c before 2 midnights.   Final disposition: Likely home Consultants:  [facility]  35 minutes with more than 50% spent in reviewing records, counseling patient/family and coordinating care.   Sch Meds:  Scheduled Meds: Continuous Infusions:  sodium chloride 125 mL/hr at 10/02/23 0536   PRN Meds:.acetaminophen **OR** acetaminophen  Antimicrobials: Anti-infectives (From admission, onward)    None  I have personally reviewed the following labs and images: CBC: Recent Labs  Lab 10/01/23 2107 10/02/23 0537  WBC 10.5  --   HGB 15.0 12.7  HCT 45.4 38.5  MCV 95.8  --   PLT 259  --    BMP &GFR Recent Labs  Lab 10/01/23 2107  NA 139  K 3.7  CL 102  CO2 27  GLUCOSE 88  BUN 22  CREATININE 0.89  CALCIUM 10.1   CrCl cannot be calculated (Unknown ideal weight.). Liver  & Pancreas: Recent Labs  Lab 10/01/23 2107  AST 19  ALT 15  ALKPHOS 71  BILITOT 0.6  PROT 8.1  ALBUMIN 4.3   No results for input(s): "LIPASE", "AMYLASE" in the last 168 hours. No results for input(s): "AMMONIA" in the last 168 hours. Diabetic: No results for input(s): "HGBA1C" in the last 72 hours. No results for input(s): "GLUCAP" in the last 168 hours. Cardiac Enzymes: No results for input(s): "CKTOTAL", "CKMB", "CKMBINDEX", "TROPONINI" in the last 168 hours. No results for input(s): "PROBNP" in the last 8760 hours. Coagulation Profile: No results for input(s): "INR", "PROTIME" in the last 168 hours. Thyroid Function Tests: Recent Labs    10/02/23 0537  TSH 2.644   Lipid Profile: No results for input(s): "CHOL", "HDL", "LDLCALC", "TRIG", "CHOLHDL", "LDLDIRECT" in the last 72 hours. Anemia Panel: No results for input(s): "VITAMINB12", "FOLATE", "FERRITIN", "TIBC", "IRON", "RETICCTPCT" in the last 72 hours. Urine analysis:    Component Value Date/Time   COLORURINE STRAW (A) 10/18/2017 1412   APPEARANCEUR CLEAR 10/18/2017 1412   LABSPEC 1.010 10/18/2017 1412   PHURINE 7.5 10/18/2017 1412   GLUCOSEU NEGATIVE 10/18/2017 1412   HGBUR NEGATIVE 10/18/2017 1412   BILIRUBINUR NEGATIVE 10/18/2017 1412   KETONESUR NEGATIVE 10/18/2017 1412   PROTEINUR NEGATIVE 10/18/2017 1412   NITRITE NEGATIVE 10/18/2017 1412   LEUKOCYTESUR NEGATIVE 10/18/2017 1412   Sepsis Labs: Invalid input(s): "PROCALCITONIN", "LACTICIDVEN"  Microbiology: No results found for this or any previous visit (from the past 240 hours).  Radiology Studies: No results found.    Malcolm Hetz T. Jasminne Mealy [facility]  If 7PM-7AM, please contact night-coverage [email or web address] 10/02/2023, 9:49 AM

## 2023-10-03 DIAGNOSIS — Z8673 Personal history of transient ischemic attack (TIA), and cerebral infarction without residual deficits: Secondary | ICD-10-CM | POA: Diagnosis not present

## 2023-10-03 DIAGNOSIS — I2510 Atherosclerotic heart disease of native coronary artery without angina pectoris: Secondary | ICD-10-CM | POA: Diagnosis not present

## 2023-10-03 DIAGNOSIS — K922 Gastrointestinal hemorrhage, unspecified: Secondary | ICD-10-CM | POA: Diagnosis not present

## 2023-10-03 DIAGNOSIS — E039 Hypothyroidism, unspecified: Secondary | ICD-10-CM | POA: Diagnosis not present

## 2023-10-03 DIAGNOSIS — K625 Hemorrhage of anus and rectum: Secondary | ICD-10-CM | POA: Diagnosis not present

## 2023-10-03 LAB — CBC
HCT: 42.7 % (ref 36.0–46.0)
Hemoglobin: 14.1 g/dL (ref 12.0–15.0)
MCH: 31.6 pg (ref 26.0–34.0)
MCHC: 33 g/dL (ref 30.0–36.0)
MCV: 95.7 fL (ref 80.0–100.0)
Platelets: 226 10*3/uL (ref 150–400)
RBC: 4.46 MIL/uL (ref 3.87–5.11)
RDW: 12.4 % (ref 11.5–15.5)
WBC: 9.5 10*3/uL (ref 4.0–10.5)
nRBC: 0 % (ref 0.0–0.2)

## 2023-10-03 MED ORDER — DILTIAZEM HCL ER COATED BEADS 180 MG PO CP24
180.0000 mg | ORAL_CAPSULE | Freq: Every day | ORAL | Status: DC
Start: 2023-10-03 — End: 2023-10-04
  Administered 2023-10-03 – 2023-10-04 (×2): 180 mg via ORAL
  Filled 2023-10-03 (×2): qty 1

## 2023-10-03 MED ORDER — IRBESARTAN 300 MG PO TABS
300.0000 mg | ORAL_TABLET | Freq: Every day | ORAL | Status: DC
  Administered 2023-10-03 – 2023-10-04 (×2): 300 mg via ORAL
  Filled 2023-10-03 (×2): qty 1

## 2023-10-03 MED ORDER — NEBIVOLOL HCL 2.5 MG PO TABS
2.5000 mg | ORAL_TABLET | Freq: Every day | ORAL | Status: DC
  Administered 2023-10-03 – 2023-10-04 (×2): 2.5 mg via ORAL
  Filled 2023-10-03 (×2): qty 1

## 2023-10-03 MED ORDER — SERTRALINE HCL 100 MG PO TABS
100.0000 mg | ORAL_TABLET | Freq: Every day | ORAL | Status: DC
  Administered 2023-10-03 – 2023-10-04 (×2): 100 mg via ORAL
  Filled 2023-10-03 (×2): qty 1

## 2023-10-03 NOTE — Progress Notes (Signed)
 PROGRESS NOTE  Stacey Ramirez:[id number] DOB: 1940-11-10   PCP: Merri Brunette, MD  Patient is from: Home.  Lives with husband.  Has rolling walker and 24/7 care.  DOA: 10/01/2023 LOS: 0  Chief complaints Chief Complaint  Patient presents with   Rectal Bleeding     Brief Narrative / Interim history: 83 year old F with PMH of Alzheimer's dementia, nonobstructive CAD, CVA in 2017 on Plavix, diverticulosis, HTN, HLD, hypothyroidism and prediabetes presenting with bright red blood per rectum the evening of 09/30/2022, and admitted with working diagnosis of hematochezia.  In ED, she was slightly hypertensive.  Hemoglobin 15.  Hemoccult positive.  [facility] consulted.  Admission requested for overnight observation pending GI evaluation.  Patient has not had further bowel movement or bleeding.  Hgb dipped to 12.7 but improved to 14.1 after stopping IV fluid.  GI advance diet to soft and signed off.  Can be discharged home if no further bleeding with soft diet.  Subjective: Seen and examined earlier this morning.  No major events overnight of this morning.  She has not had a bowel movement.  No complaints.  Patient's husband at bedside.  Objective: Vitals:   10/02/23 2136 10/03/23 0604 10/03/23 1147 10/03/23 1415  BP: (!) 187/109 (!) 181/96 138/83 (!) 150/56  Pulse: 80 78 82 76  Resp: 18 18  16   Temp: 98.1 F (36.7 C) (!) 97.5 F (36.4 C) 97.9 F (36.6 C) 97.7 F (36.5 C)  TempSrc: Oral Oral Axillary Axillary  SpO2: 97% 95% 95% 93%  Weight:      Height:        Examination:  GENERAL: No apparent distress.  Nontoxic. HEENT: MMM.  Vision and hearing grossly intact.  NECK: Supple.  No apparent JVD.  RESP:  No IWOB.  Fair aeration bilaterally. CVS:  RRR. Heart sounds normal.  ABD/GI/GU: BS+. Abd soft, NTND.  MSK/EXT:  Moves extremities. No apparent deformity. No edema.  SKIN: no apparent skin lesion or wound NEURO: Awake and alert.  Oriented to self.  No apparent focal neuro  deficit. PSYCH: Calm. Normal affect.   Procedures:  None  Microbiology summarized: None  Assessment and plan: Acute lower GI bleed/hematochezia: Single episode the evening of 10/01/2023.  Patient is on Plavix for secondary stroke prevention.  No further bowel movement or bleeding.  Hemoccult positive. Hgb dropped from 15-12.7 but improved to 14.1 after stopping IV fluid..   Patient is on Plavix.  Has history of diverticulosis per his CT in 2023.  No abdominal pain.  No tenderness. -Advance to soft diet by GI -Continue holding Plavix.  May have to hold this on discharge until follow-up with neurology. -Likely discharge on 3/3 if no further GI bleed.  Alzheimer's dementia without behavioral disturbance: She is awake and alert but totally disoriented. -Reorientation and delirium precaution -Minimize or avoid sedating medications.   Nonobstructive CAD: No report of cardiopulmonary symptoms. History of CVA in 2017 -Continue holding Plavix in the setting of GI bleed   Hypertension: BP elevated but improved after reinitiating home meds. -Continue home nebivolol, Cardizem and irbesartan.   Hypothyroidism: TSH normal. -Continue home Synthroid   Body mass index is 21.69 kg/m.           DVT prophylaxis:  SCDs Start: 10/02/23 9562  Code Status: Full code-confirmed with patient husband at bedside Family Communication: Dated patient's husband at bedside Level of care: Progressive Status is: Observation The patient remains OBS appropriate and will d/c before 2 midnights.   Final  disposition: Likely home Consultants:  [facility]  35 minutes with more than 50% spent in reviewing records, counseling patient/family and coordinating care.   Sch Meds:  Scheduled Meds:  diltiazem  180 mg Oral Daily   irbesartan  300 mg Oral Daily   nebivolol  2.5 mg Oral Daily   sertraline  100 mg Oral Daily   Continuous Infusions:   PRN Meds:.acetaminophen **OR**  acetaminophen  Antimicrobials: Anti-infectives (From admission, onward)    None        I have personally reviewed the following labs and images: CBC: Recent Labs  Lab 10/01/23 2107 10/02/23 0537 10/03/23 0534  WBC 10.5  --  9.5  HGB 15.0 12.7 14.1  HCT 45.4 38.5 42.7  MCV 95.8  --  95.7  PLT 259  --  226   BMP &GFR Recent Labs  Lab 10/01/23 2107  NA 139  K 3.7  CL 102  CO2 27  GLUCOSE 88  BUN 22  CREATININE 0.89  CALCIUM 10.1   Estimated Creatinine Clearance: 38.5 mL/min (by C-G formula based on SCr of 0.89 mg/dL). Liver & Pancreas: Recent Labs  Lab 10/01/23 2107  AST 19  ALT 15  ALKPHOS 71  BILITOT 0.6  PROT 8.1  ALBUMIN 4.3   No results for input(s): "LIPASE", "AMYLASE" in the last 168 hours. No results for input(s): "AMMONIA" in the last 168 hours. Diabetic: No results for input(s): "HGBA1C" in the last 72 hours. No results for input(s): "GLUCAP" in the last 168 hours. Cardiac Enzymes: No results for input(s): "CKTOTAL", "CKMB", "CKMBINDEX", "TROPONINI" in the last 168 hours. No results for input(s): "PROBNP" in the last 8760 hours. Coagulation Profile: No results for input(s): "INR", "PROTIME" in the last 168 hours. Thyroid Function Tests: Recent Labs    10/02/23 0537  TSH 2.644   Lipid Profile: No results for input(s): "CHOL", "HDL", "LDLCALC", "TRIG", "CHOLHDL", "LDLDIRECT" in the last 72 hours. Anemia Panel: No results for input(s): "VITAMINB12", "FOLATE", "FERRITIN", "TIBC", "IRON", "RETICCTPCT" in the last 72 hours. Urine analysis:    Component Value Date/Time   COLORURINE STRAW (A) 10/18/2017 1412   APPEARANCEUR CLEAR 10/18/2017 1412   LABSPEC 1.010 10/18/2017 1412   PHURINE 7.5 10/18/2017 1412   GLUCOSEU NEGATIVE 10/18/2017 1412   HGBUR NEGATIVE 10/18/2017 1412   BILIRUBINUR NEGATIVE 10/18/2017 1412   KETONESUR NEGATIVE 10/18/2017 1412   PROTEINUR NEGATIVE 10/18/2017 1412   NITRITE NEGATIVE 10/18/2017 1412   LEUKOCYTESUR  NEGATIVE 10/18/2017 1412   Sepsis Labs: Invalid input(s): "PROCALCITONIN", "LACTICIDVEN"  Microbiology: No results found for this or any previous visit (from the past 240 hours).  Radiology Studies: No results found.    Sayana Salley T. Zoe Goonan [facility]  If 7PM-7AM, please contact night-coverage [email or web address] 10/03/2023, 2:44 PM

## 2023-10-03 NOTE — TOC Initial Note (Signed)
 Transition of Care [facility]) - Initial/Assessment Note    Patient Details  Name: Stacey Ramirez MRN: [id number] Date of Birth: 02-11-1941  Transition of Care [facility]) CM/SW Contact:    Howell Rucks, RN Phone Number: 10/03/2023, 12:07 PM  Clinical Narrative:  Met with pt and spouse at bedside to introduce role of TOC/NCM and review for dc planning, pt with documented hx of Dementia, spouse answered assessment questions, reports pt has an established PCP and pharmacy, caregiver services 12 hrs/day, Home DME: walker, wheelchair, potty chair, shower chair. MOON completed with spouse. TOC will continue to follow.                Expected Discharge Plan: Home/Self Care Barriers to Discharge: Continued Medical Work up   Patient Goals and CMS Choice Patient states their goals for this hospitalization and ongoing recovery are:: return home          Expected Discharge Plan and Services       Living arrangements for the past 2 months: Single Family Home                                      Prior Living Arrangements/Services Living arrangements for the past 2 months: Single Family Home Lives with:: Spouse Patient language and need for interpreter reviewed:: Yes Do you feel safe going back to the place where you live?: Yes      Need for Family Participation in Patient Care: Yes (Comment) Care giver support system in place?: Yes (comment) Current home services: DME, Other (comment) (walker, wheelchair, potty chair, shower chair: caregiver 12 hrs/day) Criminal Activity/Legal Involvement Pertinent to Current Situation/Hospitalization: No - Comment as needed  Activities of Daily Living   ADL Screening (condition at time of admission) Independently performs ADLs?: Yes (appropriate for developmental age) Is the patient deaf or have difficulty hearing?: No Does the patient have difficulty seeing, even when wearing glasses/contacts?: No Does the patient have difficulty concentrating,  remembering, or making decisions?: No  Permission Sought/Granted                  Emotional Assessment Appearance:: Appears stated age Attitude/Demeanor/Rapport: Gracious Affect (typically observed): Accepting   Alcohol / Substance Use: Not Applicable Psych Involvement: No (comment)  Admission diagnosis:  Acute lower GI bleeding [K92.2] Acute GI bleeding [K92.2] Patient Active Problem List   Diagnosis Date Noted   Acute lower GI bleeding 10/02/2023   Familial hypercholesteremia 02/21/2018   Psychogenic gait 10/21/2017   CAD (coronary artery disease) 06/09/2017   Abnormal nuclear cardiac imaging test 05/25/2017   PVC's (premature ventricular contractions) 05/17/2017   Anxiety 12/29/2016   Vascular parkinsonism (HCC) 06/29/2016   Gait disorder 04/07/2016   Intracranial carotid stenosis, bilateral 02/27/2016   Essential hypertension    Left-sided weakness 12/28/2015   Headache 12/28/2015   Adjustment reaction with anxiety 12/20/2015   Left hemiparesis (HCC)    History of CVA with residual deficit    Prediabetes    SVT (supraventricular tachycardia) (HCC) 12/14/2015   Stroke-like symptom 12/13/2015   History of stroke    Heart palpitations 09/06/2012   Hypertension    Familial hyperlipidemia    Hypothyroidism    Vitamin D deficiency    Palpitations    History of dizziness    LVH (left ventricular hypertrophy)    Aortic stenosis    Mitral regurgitation    SOB (shortness of breath)  Difficulty walking    PCP:  Merri Brunette, MD Pharmacy:   [facility] [phone] - [address] [place] - [phone] AT [facility] [address] [place] [address] Phone: [phone] Fax: [phone]     Social Drivers of Health (SDOH) Social History: SDOH Screenings   Food Insecurity: No Food Insecurity (10/02/2023)  Housing: Low Risk  (10/02/2023)  Transportation Needs: No Transportation Needs (10/02/2023)  Utilities: Not At Risk (10/02/2023)  Social Connections:  Unknown (10/02/2023)  Tobacco Use: Low Risk  (10/01/2023)   SDOH Interventions:     Readmission Risk Interventions     No data to display

## 2023-10-03 NOTE — Progress Notes (Signed)
 [facility] Gastroenterology Progress Note  Stacey Ramirez 83 y.o. [birth year]   Subjective: No bleeding overnight. BM smear per nursing. Demented. Husband in room.   Objective: Vital signs: Vitals:   10/03/23 0604 10/03/23 1147  BP: (!) 181/96 138/83  Pulse: 78 82  Resp: 18   Temp: (!) 97.5 F (36.4 C) 97.9 F (36.6 C)  SpO2: 95% 95%    Physical Exam: Gen: lethargic, elderly, demented, no acute distress, well-nourished HEENT: anicteric sclera CV: RRR Chest: CTA B Abd: soft, nontender, nondistended, +BS Ext: no edema  Lab Results: Recent Labs    10/01/23 2107  NA 139  K 3.7  CL 102  CO2 27  GLUCOSE 88  BUN 22  CREATININE 0.89  CALCIUM 10.1   Recent Labs    10/01/23 2107  AST 19  ALT 15  ALKPHOS 71  BILITOT 0.6  PROT 8.1  ALBUMIN 4.3   Recent Labs    10/01/23 2107 10/02/23 0537 10/03/23 0534  WBC 10.5  --  9.5  HGB 15.0 12.7 14.1  HCT 45.4 38.5 42.7  MCV 95.8  --  95.7  PLT 259  --  226      Assessment/Plan: Hematochezia - resolved. Hgb normal. Changed to soft diet. Supportive care. Will sign off. Call if questions.   Shirley Friar 10/03/2023, 12:47 PM  Questions please call [phone] ID: Janae Sauce, female   DOB: 1941/02/03, 83 y.o.   MRN: [id number]

## 2023-10-03 NOTE — Care Management Obs Status (Signed)
 MEDICARE OBSERVATION STATUS NOTIFICATION   Patient Details  Name: Stacey Ramirez MRN: [id number] Date of Birth: 1941/03/14   Medicare Observation Status Notification Given:  Yes    Howell Rucks, RN 10/03/2023, 9:51 AM

## 2023-10-03 NOTE — Plan of Care (Signed)

## 2023-10-04 DIAGNOSIS — K922 Gastrointestinal hemorrhage, unspecified: Secondary | ICD-10-CM | POA: Diagnosis not present

## 2023-10-04 LAB — CBC
HCT: 45.4 % (ref 36.0–46.0)
Hemoglobin: 14.9 g/dL (ref 12.0–15.0)
MCH: 31.2 pg (ref 26.0–34.0)
MCHC: 32.8 g/dL (ref 30.0–36.0)
MCV: 95.2 fL (ref 80.0–100.0)
Platelets: 255 10*3/uL (ref 150–400)
RBC: 4.77 MIL/uL (ref 3.87–5.11)
RDW: 12.4 % (ref 11.5–15.5)
WBC: 8.2 10*3/uL (ref 4.0–10.5)
nRBC: 0 % (ref 0.0–0.2)

## 2023-10-04 NOTE — Plan of Care (Signed)

## 2023-10-04 NOTE — Discharge Summary (Signed)
 Physician Discharge Summary   Patient: Stacey Ramirez MRN: [id number] DOB: Dec 27, 1940  Admit date:     10/01/2023  Discharge date: 10/04/23  Discharge Physician: Alberteen Sam   PCP: Merri Brunette, MD     Recommendations at discharge:  Follow up with PCP Dr. Renne Crigler in 1 week Dr. Renne Crigler: Please recheck Hgb at follow up (discharge Hgb 14.9)     Discharge Diagnoses: Principal Problem:   Hematochezia, possible hemorrhoids  Active Problems: Alzheimer's dementia Hypertension Hypothyroidism   History of stroke   CAD (coronary artery disease)      Hospital Course: 83 year old F with Alzheimer's, lives at home, history of cerebrovascular disease and CAD on Plavix, diverticulosis, HTN, and hypothyroidism who presented with bright red rectal bleeding.  In the ER, GI were consulted and she was admitted for further evaluation    Hematochezia The patient was admitted and observed in the hospital.  She had painless rectal bleeding once, possibly diverticular, possibly hemorrhoidal in the setting of Plavix use.  48 hours observation in the hospital, she had no further bleeding, she was able to tolerate oral diet, and her hemoglobin remained stable at 14.  She was evaluated by GI who recommended no further workup, and she was discharged home to resume her Plavix in a few days.          The [facility] Controlled Substances Registry was reviewed for this patient prior to discharge.   Consultants: GI Dr. Bosie Clos Procedures performed: None  Disposition: Home Diet recommendation:  Discharge Diet Orders (From admission, onward)     Start     Ordered   10/04/23 0000  Diet - low sodium heart healthy        10/04/23 0913             DISCHARGE MEDICATION: Allergies as of 10/04/2023       Reactions   Sulfa Drugs Cross Reactors Itching   Zocor [simvastatin] Other (See Comments)   Feels like she is going to pass out, weakness   Crestor [rosuvastatin Calcium]     Muscle weakness   Statins Other (See Comments)   Unable to walk or stand. Unable to walk or stand. Unable to walk or stand.   Sulfa Antibiotics    welts   Micardis [telmisartan] Other (See Comments)   Feels like she is going to pass out        Medication List     PAUSE taking these medications    clopidogrel 75 MG tablet Wait to take this until your doctor or other care provider tells you to start again. Commonly known as: PLAVIX Take 1 tablet (75 mg total) by mouth daily.       TAKE these medications    diltiazem 180 MG 24 hr capsule Commonly known as: CARDIZEM CD TAKE 1 CAPSULE(180 MG) BY MOUTH DAILY   irbesartan 300 MG tablet Commonly known as: AVAPRO Take 1 tablet (300 mg total) by mouth daily.   nebivolol 2.5 MG tablet Commonly known as: BYSTOLIC TAKE 1 TABLET(2.5 MG) BY MOUTH DAILY   sertraline 100 MG tablet Commonly known as: ZOLOFT Take 1 tablet by mouth daily.        Follow-up Information     Merri Brunette, MD Follow up.   Specialty: Internal Medicine Contact information: [address] [place] [address] [phone]                 Discharge Instructions     Diet - low  sodium heart healthy   Complete by: As directed    Discharge instructions   Complete by: As directed    You were admitted for rectal bleeding  This was likely from a diverticulum or possibly a hemorrhoid.    Thankfully, as these kinds of bleeding usually do, it stopped by itself.  We monitored your blood level and it stayed stable.    You should resume your home medicines without change.  You may resume the Plavix in a few days.  Go see Dr. Renne Crigler for follow up in 1-2 weeks if you are able   Increase activity slowly   Complete by: As directed        Discharge Exam: Filed Weights   10/02/23 1249  Weight: 53.8 kg    General: Pt is alert, awake, not in acute distress, lying in bed Cardiovascular: RRR, nl S1-S2, no murmurs  appreciated.   No LE edema.   Respiratory: Normal respiratory rate and rhythm.  CTAB without rales or wheezes. Abdominal: Abdomen soft and non-tender.  No distension or HSM.   Neuro/Psych: Strength symmetric in upper and lower extremities.  Judgment and insight appear impaired but at baseline.   Condition at discharge: fair  The results of significant diagnostics from this hospitalization (including imaging, microbiology, ancillary and laboratory) are listed below for reference.   Imaging Studies: No results found.  Microbiology: Results for orders placed or performed during the hospital encounter of 07/29/21  Resp Panel by RT-PCR (Flu A&B, Covid) Nasopharyngeal Swab     Status: Abnormal   Collection Time: 07/29/21 10:27 AM   Specimen: Nasopharyngeal Swab; Nasopharyngeal(NP) swabs in vial transport medium  Result Value Ref Range Status   SARS Coronavirus 2 by RT PCR POSITIVE (A) NEGATIVE Final    Comment: (NOTE) SARS-CoV-2 target nucleic acids are DETECTED.  The SARS-CoV-2 RNA is generally detectable in upper respiratory specimens during the acute phase of infection. Positive results are indicative of the presence of the identified virus, but do not rule out bacterial infection or co-infection with other pathogens not detected by the test. Clinical correlation with patient history and other diagnostic information is necessary to determine patient infection status. The expected result is Negative.  Fact Sheet for Patients: BloggerCourse.com  Fact Sheet for Healthcare Providers: SeriousBroker.it  This test is not yet approved or cleared by the [place] FDA and  has been authorized for detection and/or diagnosis of SARS-CoV-2 by FDA under an Emergency Use Authorization (EUA).  This EUA will remain in effect (meaning this test can be used) for the duration of  the COVID-19 declaration under Section 564(b)(1) of the A ct,  21 U.S.C. section 360bbb-3(b)(1), unless the authorization is terminated or revoked sooner.     Influenza A by PCR NEGATIVE NEGATIVE Final   Influenza B by PCR NEGATIVE NEGATIVE Final    Comment: (NOTE) The Xpert Xpress SARS-CoV-2/FLU/RSV plus assay is intended as an aid in the diagnosis of influenza from Nasopharyngeal swab specimens and should not be used as a sole basis for treatment. Nasal washings and aspirates are unacceptable for Xpert Xpress SARS-CoV-2/FLU/RSV testing.  Fact Sheet for Patients: BloggerCourse.com  Fact Sheet for Healthcare Providers: SeriousBroker.it  This test is not yet approved or cleared by the [place] FDA and has been authorized for detection and/or diagnosis of SARS-CoV-2 by FDA under an Emergency Use Authorization (EUA). This EUA will remain in effect (meaning this test can be used) for the duration of the COVID-19 declaration under Section 564(b)(1) of  the Act, 21 U.S.C. section 360bbb-3(b)(1), unless the authorization is terminated or revoked.  Performed at [facility], [address] [place], [place] [address]     Labs: CBC: Recent Labs  Lab 10/01/23 2107 10/02/23 0537 10/03/23 0534 10/04/23 0547  WBC 10.5  --  9.5 8.2  HGB 15.0 12.7 14.1 14.9  HCT 45.4 38.5 42.7 45.4  MCV 95.8  --  95.7 95.2  PLT 259  --  226 255   Basic Metabolic Panel: Recent Labs  Lab 10/01/23 2107  NA 139  K 3.7  CL 102  CO2 27  GLUCOSE 88  BUN 22  CREATININE 0.89  CALCIUM 10.1   Liver Function Tests: Recent Labs  Lab 10/01/23 2107  AST 19  ALT 15  ALKPHOS 71  BILITOT 0.6  PROT 8.1  ALBUMIN 4.3   CBG: No results for input(s): "GLUCAP" in the last 168 hours.  Discharge time spent: approximately 45 minutes spent on discharge counseling, evaluation of patient on day of discharge, and coordination of discharge planning with nursing, social work, pharmacy and case  management  Signed: Alberteen Sam, MD [facility] 10/04/2023

## 2023-10-04 NOTE — TOC Transition Note (Signed)
 Transition of Care [facility]) - Discharge Note   Patient Details  Name: Stacey Ramirez MRN: [id number] Date of Birth: 31-Mar-1941  Transition [facility] Care [facility]) CM/SW Contact:  Lanier Clam, RN Phone Number: 10/04/2023, 9:39 AM   Clinical Narrative: d/c home No CM needs.      Final next level of care: Home/Self Care Barriers to Discharge: No Barriers Identified   Patient Goals and CMS Choice Patient states their goals for this hospitalization and ongoing recovery are:: return home          Discharge Placement                       Discharge Plan and Services Additional resources added to the After Visit Summary for                                       Social Drivers of Health (SDOH) Interventions SDOH Screenings   Food Insecurity: No Food Insecurity (10/02/2023)  Housing: Low Risk  (10/02/2023)  Transportation Needs: No Transportation Needs (10/02/2023)  Utilities: Not At Risk (10/02/2023)  Social Connections: Unknown (10/02/2023)  Tobacco Use: Low Risk  (10/01/2023)     Readmission Risk Interventions     No data to display

## 2023-10-06 ENCOUNTER — Telehealth: Payer: Self-pay | Admitting: Cardiovascular Disease

## 2023-10-06 NOTE — Telephone Encounter (Signed)
*  STAT* If patient is at the pharmacy, call can be transferred to refill team.   1. Which medications need to be refilled? (please list name of each medication and dose if known)   diltiazem (CARDIZEM CD) 180 MG 24 hr capsule   2. Would you like to learn more about the convenience, safety, & potential cost savings by using the [facility]?   3. Are you open to using the [facility] (Type [facility]. ).  4. Which pharmacy/location (including street and city if local pharmacy) is medication to be sent to?  [id number] - [place], [place] - [address] AT [facility] [address]   5. Do they need a 30 day or 90 day supply?   90 day  Daughter (Tammy) stated patient is running out of this medication.

## 2023-10-06 NOTE — Telephone Encounter (Signed)
 Called pt's daughter to inform her that pt is overdue for an appointment and pt needed to make an appt with Cardiologist for further refills. Pt's daughter stated that she would call back to schedule pt's appt. Waiting for pt's appt to be scheduled, before sending in refill.

## 2023-10-07 MED ORDER — DILTIAZEM HCL ER COATED BEADS 180 MG PO CP24: 180.0000 mg | ORAL_CAPSULE | Freq: Every day | ORAL | 0 refills | Status: DC

## 2023-10-07 NOTE — Telephone Encounter (Signed)
 Daughter (Tammy) called in response to staff request and scheduled follow-up visit on 3/24.

## 2023-10-07 NOTE — Telephone Encounter (Signed)
 Pt's medication was sent to pt's pharmacy as requested. Confirmation received.

## 2023-10-23 NOTE — Progress Notes (Unsigned)
 Cardiology Office Note    Patient Name: Stacey Ramirez Date of Encounter: 10/23/2023  Primary Care Provider:  Merri Brunette, MD Primary Cardiologist:  Stacey Miss, MD Primary Electrophysiologist: None   Past Medical History    Past Medical History:  Diagnosis Date   Adjustment reaction with anxiety 12/20/2015   Aortic stenosis    CAD (coronary artery disease) 06/09/2017   Nuc study 10/18: EF 69, inferolateral perfusion defect-possible small infarct with peri-infarct ischemia; intermediate risk // LHC 10/18: pLAD 25, pLCx 50, pRCA 25 >> med Rx   Carotid stenosis, asymptomatic, bilateral 02/27/2016   Carotid US 5/17: Bilateral ICA 1-39   Cerebrovascular accident (stroke) (HCC)    a. 12/2015 - cryptogenic.  S/P MDT Linq.   Difficulty walking    Essential hypertension    Gait disorder 04/07/2016   Heart palpitations 09/06/2012   History of CVA with residual deficit 12/14/2015   History of dizziness    History of echocardiogram    Echo 10/18: Vigorous LVF, EF 65-70, normal wall motion, grade 1 diastolic dysfunction, GLS -21.1%, mild RAE   History of nuclear stress test    Nuclear stress test 10/18: EF 69, inf-lat defect c/w poss infarct with peri-infarct ischemia; Intermediate Risk   Hyperlipidemia    Hypothyroidism    Left hemiparesis (HCC)    LVH (left ventricular hypertrophy)    Mitral regurgitation    a. 12/2015 Echo: EF 60-65%, mild focal basal hypertrophy. No rwma, triv AI, mild MR, mildly dil LA w/o thrombus. No RA thrombus. No PFO.   Palpitations    Prediabetes    Stroke-like symptom 12/13/2015   SVT (supraventricular tachycardia) (HCC) 12/14/2015   Vascular parkinsonism (HCC) 06/29/2016   Vitamin D deficiency     History of Present Illness  Stacey Ramirez is a 83 y.o. female with PMH of CAD s/p LHC 2018 showing nonobstructive CAD, HTN, HLD, CVA s/p ILR, PVCs s/p event monitor with 5% burden and nonsustained SVT, carotid artery disease with (1-39% bilateral ICA), hypothyroidism  who presents today for 1 year follow-up.  Stacey Ramirez was last seen on 08/28/2022 for follow-up.  During visit patient was doing well and blood pressures were normal.  She was provided referral to the lipid clinic however declined at that time.  There were no changes made to her medications and patient was advised to contact office if she had any concerns.    Patient denies chest pain, palpitations, dyspnea, PND, orthopnea, nausea, vomiting, dizziness, syncope, edema, weight gain, or early satiety.   Discussed the use of AI scribe software for clinical note transcription with the patient, who gave verbal consent to proceed.  History of Present Illness    ***Notes: -Last ischemic evaluation:  Review of Systems  Please see the history of present illness.    All other systems reviewed and are otherwise negative except as noted above.  Physical Exam    Wt Readings from Last 3 Encounters:  10/02/23 118 lb 9.7 oz (53.8 kg)  08/28/22 112 lb (50.8 kg)  07/29/21 121 lb 14.6 oz (55.3 kg)   ZO:XWRUE were no vitals filed for this visit.,There is no height or weight on file to calculate BMI. GEN: Well nourished, well developed in no acute distress Neck: No JVD; No carotid bruits Pulmonary: Clear to auscultation without rales, wheezing or rhonchi  Cardiovascular: Normal rate. Regular rhythm. Normal S1. Normal S2.   Murmurs: There is no murmur.  ABDOMEN: Soft, non-tender, non-distended EXTREMITIES:  No edema; No deformity  EKG/LABS/ Recent Cardiac Studies   ECG personally reviewed by me today - ***  Risk Assessment/Calculations:   {Does this patient have ATRIAL FIBRILLATION?:[phone]}      Lab Results  Component Value Date   WBC 8.2 10/04/2023   HGB 14.9 10/04/2023   HCT 45.4 10/04/2023   MCV 95.2 10/04/2023   PLT 255 10/04/2023   Lab Results  Component Value Date   CREATININE 0.89 10/01/2023   BUN 22 10/01/2023   NA 139 10/01/2023   K 3.7 10/01/2023   CL 102 10/01/2023    CO2 27 10/01/2023   Lab Results  Component Value Date   CHOL 385 (H) 09/13/2017   HDL 47 09/13/2017   LDLCALC 301 (H) 09/13/2017   LDLDIRECT 224.6 12/14/2011   TRIG 183 (H) 09/13/2017   CHOLHDL 8.2 (H) 09/13/2017    Lab Results  Component Value Date   HGBA1C 5.7 (H) 12/13/2015   Assessment & Plan    1. Nonobstructive CAD: -s/p LHC in 2018 that showed mild nonobstructive CAD -Today patient reports   2.Essential hypertension: -Patient's blood pressure today is  3.  Hyperlipidemia  4.History of CVA: -Patient suffered CVA 2017 -Continue Plavix 75 mg currently       Disposition: Follow-up with Stacey Miss, MD or APP in *** months {Are you ordering a CV Procedure (e.g. stress test, cath, DCCV, TEE, etc)?   Press F2        :[id number]}   Signed, Stacey Ramirez, Stacey Rains, NP 10/23/2023, 5:46 PM [facility]

## 2023-10-25 ENCOUNTER — Ambulatory Visit: Attending: Nurse Practitioner | Admitting: Nurse Practitioner

## 2023-10-25 ENCOUNTER — Other Ambulatory Visit: Payer: Self-pay | Admitting: Cardiovascular Disease

## 2023-10-25 ENCOUNTER — Encounter: Payer: Self-pay | Admitting: Nurse Practitioner

## 2023-10-25 VITALS — BP 122/70 | HR 67 | Resp 16 | Ht 62.0 in | Wt 118.0 lb

## 2023-10-25 DIAGNOSIS — I6930 Unspecified sequelae of cerebral infarction: Secondary | ICD-10-CM

## 2023-10-25 DIAGNOSIS — I2510 Atherosclerotic heart disease of native coronary artery without angina pectoris: Secondary | ICD-10-CM | POA: Diagnosis not present

## 2023-10-25 DIAGNOSIS — E785 Hyperlipidemia, unspecified: Secondary | ICD-10-CM

## 2023-10-25 DIAGNOSIS — I10 Essential (primary) hypertension: Secondary | ICD-10-CM | POA: Diagnosis not present

## 2023-10-25 MED ORDER — CLOPIDOGREL BISULFATE 75 MG PO TABS: 75.0000 mg | ORAL_TABLET | Freq: Every day | ORAL | 3 refills | Status: AC

## 2023-10-25 NOTE — Patient Instructions (Signed)
 Medication Instructions:  Your physician recommends that you continue on your current medications as directed. Please refer to the Current Medication list given to you today. *If you need a refill on your cardiac medications before your next appointment, please call your pharmacy*   Lab Work: None ordered If you have labs (blood work) drawn today and your tests are completely normal, you will receive your results only by: MyChart Message (if you have MyChart) OR A paper copy in the mail If you have any lab test that is abnormal or we need to change your treatment, we will call you to review the results.   Testing/Procedures: None ordered   Follow-Up: At [facility], you and your health needs are our priority.  As part of our continuing mission to provide you with exceptional heart care, we have created designated Provider Care Teams.  These Care Teams include your primary Cardiologist (physician) and Advanced Practice Providers (APPs -  Physician Assistants and Nurse Practitioners) who all work together to provide you with the care you need, when you need it.  We recommend signing up for the patient portal called "MyChart".  Sign up information is provided on this After Visit Summary.  MyChart is used to connect with patients for Virtual Visits (Telemedicine).  Patients are able to view lab/test results, encounter notes, upcoming appointments, etc.  Non-urgent messages can be sent to your provider as well.   To learn more about what you can do with MyChart, go to [email or web address].    Your next appointment:   6 month(s)  Provider:   Weston Brass, MD  Other Instructions

## 2023-10-29 ENCOUNTER — Other Ambulatory Visit: Payer: Self-pay | Admitting: Cardiovascular Disease

## 2023-11-10 DIAGNOSIS — K921 Melena: Secondary | ICD-10-CM | POA: Diagnosis not present

## 2023-12-24 ENCOUNTER — Other Ambulatory Visit: Payer: Self-pay

## 2023-12-24 MED ORDER — IRBESARTAN 300 MG PO TABS: 300.0000 mg | ORAL_TABLET | Freq: Every day | ORAL | 3 refills | Status: AC

## 2024-04-26 ENCOUNTER — Encounter: Payer: Self-pay | Admitting: *Deleted

## 2024-04-28 ENCOUNTER — Ambulatory Visit: Attending: Internal Medicine | Admitting: Internal Medicine

## 2024-04-28 VITALS — BP 118/60 | HR 68 | Ht 62.0 in | Wt 115.0 lb

## 2024-04-28 DIAGNOSIS — I493 Ventricular premature depolarization: Secondary | ICD-10-CM | POA: Diagnosis not present

## 2024-04-28 DIAGNOSIS — I10 Essential (primary) hypertension: Secondary | ICD-10-CM | POA: Diagnosis not present

## 2024-04-28 DIAGNOSIS — I2510 Atherosclerotic heart disease of native coronary artery without angina pectoris: Secondary | ICD-10-CM

## 2024-04-28 DIAGNOSIS — E785 Hyperlipidemia, unspecified: Secondary | ICD-10-CM | POA: Diagnosis not present

## 2024-04-28 DIAGNOSIS — I471 Supraventricular tachycardia, unspecified: Secondary | ICD-10-CM

## 2024-04-28 DIAGNOSIS — I6930 Unspecified sequelae of cerebral infarction: Secondary | ICD-10-CM | POA: Diagnosis not present

## 2024-04-28 NOTE — Progress Notes (Signed)
  Cardiology Office Note:  .   Date:  04/28/2024  ID:  Stacey Ramirez, DOB [birth year], MRN [id number] PCP: Clarice Nottingham, MD  [facility] Providers Cardiologist:  Soyla DELENA Merck, MD Cardiology APP:  Lelon Glendia DASEN, PA-C    History of Present Illness: .   Stacey Ramirez is a 83 y.o. female.  Discussed the use of AI scribe software for clinical note transcription with the patient, who gave verbal consent to proceed.  History of Present Illness Stacey Ramirez is an 83 year old female with nonobstructive coronary artery disease, hypertension, and hyperlipidemia who presents for a routine follow-up.  She has nonobstructive coronary artery disease and is on Plavix  75 mg daily. There have been no recent episodes of chest pain or other cardiac symptoms. Her hypertension is managed with Cardizem  180 mg daily, irbesartan  300 mg daily, and Bystolic  2.5 mg daily. She has hyperlipidemia with persistently high cholesterol levels despite previous medication trials, including Repatha, and is not currently on lipid-lowering therapy. A loop recorder is in place, showing premature ventricular contractions with a 5% burden and non-sustained supraventricular tachycardia. No chest pain, breathing difficulties, or other concerns are reported.    ROS: negative except per HPI above.  Studies Reviewed: .        Results LABS Complete blood count: Normal Renal function: Normal Hepatic function tests: Normal Thyroid  function tests: Normal Platelet count: Normal  DIAGNOSTIC Electrocardiogram (EKG): Normal Risk Assessment/Calculations:       Physical Exam:   VS:  BP 118/60 (BP Location: Left Arm, Patient Position: Sitting, Cuff Size: Normal)   Pulse 68   Ht 5' 2 (1.575 m)   Wt 115 lb (52.2 kg)   SpO2 96%   BMI 21.03 kg/m    Wt Readings from Last 3 Encounters:  04/28/24 115 lb (52.2 kg)  10/25/23 118 lb (53.5 kg)  10/02/23 118 lb 9.7 oz (53.8 kg)     Physical Exam GENERAL: Alert,  cooperative, well developed, no acute distress. Limited interaction with conversation. HEENT: Normocephalic, normal oropharynx, moist mucous membranes CHEST: Clear to auscultation bilaterally, no wheezes, rhonchi, or crackles CARDIOVASCULAR: Normal heart rate and rhythm, S1 and S2 normal without murmurs ABDOMEN: Soft, non-tender, non-distended, without organomegaly, normal bowel sounds EXTREMITIES: No cyanosis or edema NEUROLOGICAL: Cranial nerves grossly intact, moves all extremities without gross motor or sensory deficit   ASSESSMENT AND PLAN: .    Assessment and Plan Assessment & Plan Nonobstructive coronary artery disease Resolved rectal bleeding after Plavix  pause. - Continue Plavix  75 mg daily.  Hypertension Hypertension well-controlled, stable blood pressure and heart rate. - Continue Cardizem  180 mg daily. - Continue Irbesartan  300 mg daily. - Continue Bystolic  2.5 mg daily.  Hyperlipidemia Untreated due to previous therapy failures. LDL last at 275 mg/dL, cholesterol over 699 mg/dL. No aggressive treatment pursued.  History of cerebrovascular accident Continues Plavix  for secondary prevention.  Carotid artery disease Mild disease, no recent changes or interventions.  Premature ventricular contractions and nonsustained supraventricular tachycardia 5% burden on event monitor.      Soyla Merck, MD, FACC

## 2024-04-28 NOTE — Patient Instructions (Signed)
  Follow-Up: At [facility], you and your health needs are our priority.  As part of our continuing mission to provide you with exceptional heart care, our providers are all part of one team.  This team includes your primary Cardiologist (physician) and Advanced Practice Providers or APPs (Physician Assistants and Nurse Practitioners) who all work together to provide you with the care you need, when you need it.  Your next appointment:   1 year(s)  Provider:   Gayatri A Acharya, MD

## 2024-09-04 ENCOUNTER — Telehealth: Payer: Self-pay

## 2024-09-05 MED ORDER — NEBIVOLOL HCL 2.5 MG PO TABS: 2.5000 mg | ORAL_TABLET | Freq: Every day | ORAL | 2 refills | Status: AC

## 2024-09-05 NOTE — Telephone Encounter (Signed)
 Patient is following up regarding prescription request.

## 2024-09-05 NOTE — Telephone Encounter (Signed)
 Refill sent
